# Patient Record
Sex: Male | Born: 1950
Health system: Southern US, Community
[De-identification: ages and names within clinical notes are randomized; demographics above are authoritative.]

## PROBLEM LIST (undated history)

## (undated) DIAGNOSIS — I219 Acute myocardial infarction, unspecified: Secondary | ICD-10-CM

## (undated) DIAGNOSIS — M545 Low back pain, unspecified: Secondary | ICD-10-CM

## (undated) DIAGNOSIS — E119 Type 2 diabetes mellitus without complications: Secondary | ICD-10-CM

## (undated) DIAGNOSIS — H269 Unspecified cataract: Secondary | ICD-10-CM

## (undated) DIAGNOSIS — I10 Essential (primary) hypertension: Secondary | ICD-10-CM

## (undated) DIAGNOSIS — N289 Disorder of kidney and ureter, unspecified: Secondary | ICD-10-CM

## (undated) DIAGNOSIS — H409 Unspecified glaucoma: Secondary | ICD-10-CM

## (undated) DIAGNOSIS — E782 Mixed hyperlipidemia: Secondary | ICD-10-CM

## (undated) DIAGNOSIS — I2510 Atherosclerotic heart disease of native coronary artery without angina pectoris: Secondary | ICD-10-CM

## (undated) DIAGNOSIS — N183 Chronic kidney disease, stage 3 unspecified: Secondary | ICD-10-CM

## (undated) HISTORY — DX: Unspecified cataract: H26.9

## (undated) HISTORY — DX: Low back pain, unspecified: M54.50

## (undated) HISTORY — DX: Type 2 diabetes mellitus without complications: E11.9

## (undated) HISTORY — DX: Atherosclerotic heart disease of native coronary artery without angina pectoris: I25.10

## (undated) HISTORY — DX: Acute myocardial infarction, unspecified: I21.9

## (undated) HISTORY — DX: Essential (primary) hypertension: I10

## (undated) HISTORY — PX: CATARACT EXTRACTION, BILATERAL: SHX1313

## (undated) HISTORY — DX: Unspecified glaucoma: H40.9

## (undated) HISTORY — DX: Chronic kidney disease, stage 3 (moderate): N18.3

## (undated) HISTORY — DX: Mixed hyperlipidemia: E78.2

## (undated) HISTORY — DX: Low back pain: M54.5

## (undated) HISTORY — DX: Chronic kidney disease, stage 3 unspecified: N18.30

---

## 2004-05-07 HISTORY — PX: CORONARY ANGIOPLASTY WITH STENT PLACEMENT: SHX49

## 2004-12-04 ENCOUNTER — Inpatient Hospital Stay (HOSPITAL_COMMUNITY): Admission: AD | Admit: 2004-12-04 | Discharge: 2004-12-07 | Payer: Self-pay | Admitting: Cardiology

## 2004-12-04 ENCOUNTER — Encounter: Payer: Self-pay | Admitting: Cardiology

## 2004-12-04 ENCOUNTER — Ambulatory Visit: Payer: Self-pay | Admitting: Internal Medicine

## 2004-12-04 ENCOUNTER — Ambulatory Visit: Payer: Self-pay | Admitting: Cardiology

## 2004-12-05 ENCOUNTER — Encounter: Payer: Self-pay | Admitting: Cardiology

## 2004-12-14 ENCOUNTER — Ambulatory Visit: Payer: Self-pay | Admitting: Family Medicine

## 2004-12-21 ENCOUNTER — Ambulatory Visit: Payer: Self-pay | Admitting: Internal Medicine

## 2005-01-23 ENCOUNTER — Ambulatory Visit: Payer: Self-pay | Admitting: Family Medicine

## 2005-04-04 ENCOUNTER — Ambulatory Visit: Payer: Self-pay | Admitting: Cardiology

## 2005-04-20 ENCOUNTER — Ambulatory Visit: Payer: Self-pay | Admitting: Family Medicine

## 2005-05-15 ENCOUNTER — Ambulatory Visit: Payer: Self-pay | Admitting: Family Medicine

## 2005-11-30 ENCOUNTER — Ambulatory Visit: Payer: Self-pay | Admitting: Cardiology

## 2006-12-04 ENCOUNTER — Ambulatory Visit: Payer: Self-pay | Admitting: Cardiology

## 2007-03-26 ENCOUNTER — Ambulatory Visit: Payer: Self-pay | Admitting: Cardiology

## 2007-07-23 ENCOUNTER — Ambulatory Visit: Payer: Self-pay | Admitting: Cardiology

## 2007-11-12 ENCOUNTER — Ambulatory Visit: Payer: Self-pay | Admitting: Cardiology

## 2007-11-12 LAB — CONVERTED CEMR LAB
ALT: 22 units/L
Albumin: 4.7 g/dL
BUN: 19 mg/dL
Calcium: 10.1 mg/dL
Chloride: 103 meq/L
HCT: 45.6 %
Hemoglobin: 15.6 g/dL
Total Bilirubin: 0.8 mg/dL
Total Protein: 7.2 g/dL
WBC: 6.5 10*3/uL

## 2008-03-19 ENCOUNTER — Ambulatory Visit: Payer: Self-pay | Admitting: Cardiology

## 2008-08-02 ENCOUNTER — Ambulatory Visit: Payer: Self-pay | Admitting: Cardiology

## 2008-11-24 DIAGNOSIS — E782 Mixed hyperlipidemia: Secondary | ICD-10-CM | POA: Insufficient documentation

## 2008-12-01 ENCOUNTER — Ambulatory Visit: Payer: Self-pay | Admitting: Cardiology

## 2008-12-01 ENCOUNTER — Encounter (INDEPENDENT_AMBULATORY_CARE_PROVIDER_SITE_OTHER): Payer: Self-pay | Admitting: *Deleted

## 2008-12-09 ENCOUNTER — Encounter: Payer: Self-pay | Admitting: Cardiology

## 2008-12-09 LAB — CONVERTED CEMR LAB
CO2: 20 meq/L (ref 19–32)
Creatinine, Ser: 1.33 mg/dL (ref 0.40–1.50)
Potassium: 4.7 meq/L (ref 3.5–5.3)

## 2009-01-18 ENCOUNTER — Ambulatory Visit: Payer: Self-pay | Admitting: Cardiology

## 2009-02-02 ENCOUNTER — Ambulatory Visit: Payer: Self-pay | Admitting: Cardiology

## 2009-02-02 ENCOUNTER — Encounter: Payer: Self-pay | Admitting: Cardiology

## 2009-02-03 ENCOUNTER — Encounter (INDEPENDENT_AMBULATORY_CARE_PROVIDER_SITE_OTHER): Payer: Self-pay | Admitting: *Deleted

## 2009-02-07 ENCOUNTER — Encounter (INDEPENDENT_AMBULATORY_CARE_PROVIDER_SITE_OTHER): Payer: Self-pay | Admitting: *Deleted

## 2009-02-22 ENCOUNTER — Telehealth (INDEPENDENT_AMBULATORY_CARE_PROVIDER_SITE_OTHER): Payer: Self-pay | Admitting: *Deleted

## 2009-03-07 ENCOUNTER — Encounter (INDEPENDENT_AMBULATORY_CARE_PROVIDER_SITE_OTHER): Payer: Self-pay | Admitting: *Deleted

## 2009-03-14 ENCOUNTER — Encounter: Payer: Self-pay | Admitting: Cardiology

## 2009-03-23 ENCOUNTER — Ambulatory Visit: Payer: Self-pay | Admitting: Cardiology

## 2009-04-08 ENCOUNTER — Encounter (INDEPENDENT_AMBULATORY_CARE_PROVIDER_SITE_OTHER): Payer: Self-pay | Admitting: *Deleted

## 2009-04-26 ENCOUNTER — Encounter (INDEPENDENT_AMBULATORY_CARE_PROVIDER_SITE_OTHER): Payer: Self-pay | Admitting: *Deleted

## 2009-07-28 ENCOUNTER — Ambulatory Visit: Payer: Self-pay | Admitting: Cardiology

## 2009-08-22 ENCOUNTER — Ambulatory Visit: Payer: Self-pay | Admitting: Cardiology

## 2009-08-22 DIAGNOSIS — G4700 Insomnia, unspecified: Secondary | ICD-10-CM | POA: Insufficient documentation

## 2009-11-25 ENCOUNTER — Ambulatory Visit: Payer: Self-pay | Admitting: Cardiology

## 2009-11-25 DIAGNOSIS — E119 Type 2 diabetes mellitus without complications: Secondary | ICD-10-CM

## 2009-12-07 ENCOUNTER — Telehealth (INDEPENDENT_AMBULATORY_CARE_PROVIDER_SITE_OTHER): Payer: Self-pay | Admitting: *Deleted

## 2010-01-04 ENCOUNTER — Telehealth (INDEPENDENT_AMBULATORY_CARE_PROVIDER_SITE_OTHER): Payer: Self-pay | Admitting: *Deleted

## 2010-01-04 DIAGNOSIS — I2510 Atherosclerotic heart disease of native coronary artery without angina pectoris: Secondary | ICD-10-CM | POA: Insufficient documentation

## 2010-01-16 ENCOUNTER — Encounter (INDEPENDENT_AMBULATORY_CARE_PROVIDER_SITE_OTHER): Payer: Self-pay | Admitting: *Deleted

## 2010-03-01 ENCOUNTER — Ambulatory Visit: Payer: Self-pay | Admitting: Cardiology

## 2010-04-11 ENCOUNTER — Ambulatory Visit: Payer: Self-pay | Admitting: Cardiology

## 2010-06-06 NOTE — Letter (Signed)
Summary: Generic Letter  Architectural technologist at [place]  [address], [place] [address]   Phone: [phone]  Fax: [phone]        January 16, 2010 MRN: [id number]    [facility] [address] [place], [place]  [address]    Dear Mr. Penny,   After numerous attempts to reach you by telephone.  We are contacting   you by letter to let you know that you no longer need to take plavix   75mg  daily.  This medication can be stopped per Dr. Dietrich Pates and Dr.   Riley Kill.  Please continue to take an 81mg  aspirin daily.  Please call   our office and give a working number that will accept our messages.         Sincerely, Teressa Lower RN  This letter has been electronically signed by your physician.

## 2010-06-06 NOTE — Assessment & Plan Note (Signed)
Summary: 6 MOFU PER MARCH REMIDNER-SRS   Visit Type:  Follow-up Primary Provider:  [facility]  CC:  follow-up visit.  History of Present Illness: the patient is a 60 year old male with history of coronary artery disease, status post prior anterior wall myocardial infarction July 2006. The patient continues to participate in a cholesterol study drug protocol. The patient had a recent stress test done which showed a small anterior scar/defect, no ischemia with preserved LV function of 64%.  The patient denies any chest pain shortness of breath orthopnea PND. He stable from a cardiovascular perspective. He denies any palpitations or presyncope.  The patient lives by himself, he only has a small social network to spend most of the day by himself. He does not meet frank criteria for anxiety disorder or depression but does report significant insomnia and fatigue. The patient is requesting a sleep medication. He states symptoms have been going on for a long time.  Clinical Review Panels:  Cardiac Imaging Cardiac Cath Findings  CONCLUSION:  1.  Acute anterior wall infarction with subtotal occlusion of the left      anterior descending with slow flow and successful stenting and total      occlusion of the diagonal subbranch, which was successfully dilated with      restoration of TIMI-3 flow.  2.  Diffuse luminal irregularities throughout the coronary vasculature      suggestive of diabetic coronary arteriopathy   PLAN:  1.  We will need to arrange for a primary care physician for long-term      diabetic management.  2.  Hemoglobin A1c will be obtained.  3.  The patient will be treated with aspirin and Plavix indefinitely.  4.  Consideration will be given to enrollment of the patient is if he      consents into the IMPROVE-IT study.  5.  I will review the films with my colleagues to make a decision about the      right coronary artery.  6.  The sheath will be removed in  the coronary care unit with the ACT is      less than 150 seconds.      TDS/MEDQ  D:  12/04/2004  T:  12/04/2004  Job:  [id number]   cc:   [facility]   Marrian Salvage. Freida Busman, M.D. [facility] (12/04/2004)    Preventive Screening-Counseling & Management  Alcohol-Tobacco     Smoking Status: quit     Year Quit: 1989  Current Medications (verified): 1)  Metformin Hcl 500 Mg Tabs (Metformin Hcl) .... Take 1 Tab Two Times A Day 2)  Glipizide 5 Mg Tabs (Glipizide) .... Take 1 Tab Two Times A Day 3)  Plavix 75 Mg Tabs (Clopidogrel Bisulfate) .... Take 1 Tablet Daily 4)  Metoprolol Tartrate 25 Mg Tabs (Metoprolol Tartrate) .... Take 1 Tablet Two Times A Day 5)  Nitrostat 0.4 Mg Subl (Nitroglycerin) .... Place Under Tongue As Directed For Chest Pain Not To Exceed 3 Tabs 6)  Ascensia Autodisc Test  Disk (Glucose Blood) .... Test Blood On Strip At Bedtime and Before Meals 7)  Lisinopril 20 Mg Tabs (Lisinopril) .... Take 1 Tablet By Mouth Once A Day 8)  Aspirin 81 Mg Tbec (Aspirin) .... Take One Tablet By Mouth Daily 9)  Research Study Drug .... Take As Directed 10)  Ambien 5 Mg Tabs (Zolpidem Tartrate) .... Take 1 Tab By Mouth At Bedtime  Allergies (verified): No Known Drug Allergies  Comments:  Nurse/Medical Assistant: The patient's medications and allergies were reviewed with the patient and were updated in the Medication and Allergy Lists. Bottles reviewed.  Past History:  Past Medical History: Last updated: 01/18/2009 Current Problems:  HYPERTENSION (ICD-401.9) DIABETES MELLITUS, TYPE II, UNCONTROLLED, WITH KETOACIDOSIS (ICD-250.10) HYPERLIPIDEMIA (ICD-272.4) CAD (ICD-414.00)  1. Coronary artery disease, status post anterior myocardial infarction     in July 2006, treated with drug-eluting stent to the left anterior     descending.     a.     Residual 75% distal right coronary artery lesion. 2. Preserved left ventricular function with a ejection fraction of     50%. 3.  Diabetes mellitus. 4. Hypertension. 5. Dyslipidemia.     a.     IMPROVE-IT study.   Past Surgical History: Last updated: 12-12-08 cath (stenting of the mid left anterior decending coronary artery with bare metal stent)  Family History: Last updated: 12-Dec-2008 Father:deceased brain aneurysm Mother:small cell carcinoma of lung,kidney and heart  Social History: Last updated: December 12, 2008 Disabled  Married  Tobacco Use - Former.  Alcohol Use - no Regular Exercise - no Drug Use - no pt has 2 sons  Risk Factors: Exercise: no (12-Dec-2008)  Risk Factors: Smoking Status: quit (08/22/2009)  Social History: Smoking Status:  quit  Review of Systems  The patient denies fatigue, malaise, fever, weight gain/loss, vision loss, decreased hearing, hoarseness, chest pain, palpitations, shortness of breath, prolonged cough, wheezing, sleep apnea, coughing up blood, abdominal pain, blood in stool, nausea, vomiting, diarrhea, heartburn, incontinence, blood in urine, muscle weakness, joint pain, leg swelling, rash, skin lesions, headache, fainting, dizziness, depression, anxiety, enlarged lymph nodes, easy bruising or bleeding, and environmental allergies.    Vital Signs:  Patient profile:   60 year old male Height:      70 inches Weight:      176 pounds Pulse rate:   67 / minute BP sitting:   131 / 85  (left arm) Cuff size:   regular  Vitals Entered By: Carlye Grippe (August 22, 2009 8:44 AM) CC: follow-up visit   Physical Exam  Additional Exam:  General: Well-developed, well-nourished in no distress head: Normocephalic and atraumatic eyes PERRLA/EOMI intact, conjunctiva and lids normal nose: No deformity or lesions mouth normal dentition, normal posterior pharynx neck: Supple, no JVD.  No masses, thyromegaly or abnormal cervical nodes lungs: Normal breath sounds bilaterally without wheezing.  Normal percussion heart: regular rate and rhythm with normal S1 and S2, no S3 or S4.   PMI is normal.  No pathological murmurs abdomen: Normal bowel sounds, abdomen is soft and nontender without masses, organomegaly or hernias noted.  No hepatosplenomegaly musculoskeletal: Back normal, normal gait muscle strength and tone normal pulsus: Pulse is normal in all 4 extremities Extremities: No peripheral pitting edema neurologic: Alert and oriented x 3 skin: Intact without lesions or rashes cervical nodes: No significant adenopathy psychologic: Normal affect    Nuclear ETT  Procedure date:  08/24/2009  Findings:      no chest pain no EKG change moderate decrease in activity in the mid anterior wall and the apex. Thick scar. Consistent with old anteroapical infarct no ischemia ejection fraction 64%  Impression & Recommendations:  Problem # 1:  CORONARY ARTERY DISEASE, S/P PTCA (ICD-414.9) the patient denies intercurrent chest pain. He had a recent Cardiolite study done which was negative for ischemia. The following medications were removed from the medication list:    Prinivil 20 Mg Tabs (Lisinopril) .[name]... Take 1 tablet daily His updated  medication list for this problem includes:    Plavix 75 Mg Tabs (Clopidogrel bisulfate) .[name]... Take 1 tablet daily    Metoprolol Tartrate 25 Mg Tabs (Metoprolol tartrate) .[name]... Take 1 tablet two times a day    Nitrostat 0.4 Mg Subl (Nitroglycerin) .[name]... Place under tongue as directed for chest pain not to exceed 3 tabs    Lisinopril 20 Mg Tabs (Lisinopril) .[name]... Take 1 tablet by mouth once a day    Aspirin 81 Mg Tbec (Aspirin) .[name]... Take one tablet by mouth daily  Problem # 2:  HYPERTENSION (ICD-401.9) blood pressure is well controlled her current medical regimen. The following medications were removed from the medication list:    Prinivil 20 Mg Tabs (Lisinopril) .[name]... Take 1 tablet daily His updated medication list for this problem includes:    Metoprolol Tartrate 25 Mg Tabs (Metoprolol tartrate) .[name]... Take 1 tablet two times a day     Lisinopril 20 Mg Tabs (Lisinopril) .[name]... Take 1 tablet by mouth once a day    Aspirin 81 Mg Tbec (Aspirin) .[name]... Take one tablet by mouth daily  Problem # 3:  HYPERLIPIDEMIA (ICD-272.4) patient is followed in a research protocol for his lipid panel.  Problem # 4:  INSOMNIA, CHRONIC (ICD-307.42) the patient does not meet criteria for depression or generalized anxiety disorder. However the patient does live by himself and is at risk for mood disorder. Active and in the short term prescription of Ambien 5 minutes p.o. q.h.s. without any refills.  Patient Instructions: 1)  Ambien 5mg  at bedtime  2)  Follow up in  6 months  Prescriptions: AMBIEN 5 MG TABS (ZOLPIDEM TARTRATE) Take 1 tab by mouth at bedtime  #30 x 0   Entered by:   Hoover Brunette, LPN   Authorized by:   Lewayne Bunting, MD, [facility]   Signed by:   Hoover Brunette, LPN on 89/38/1017   Method used:   Print then Give to Patient   RxID:   [id number]   Handout requested.

## 2010-06-06 NOTE — Progress Notes (Signed)
Summary: Plavix no longer necessary per Dr. Riley Kill  Phone Note Other Incoming   Caller: Dr. Shawnie Pons Call For: Dr. Dietrich Pates Details for Reason: Opinion re: D/C Plavix Summary of Call: Mr. [facility] cardiac cath note in 2006 suggested that ASA and clopidigrel would be required indefinitely.  I asked Dr. Riley Kill to review, who provided the following:  Rob I reviewed all of the notes regarding Edward [name].  The cath report says we placed a non drug eluting stent, your office note says DES.  The stent was a non drug eluting.  As such, there is no indication for continuing this, although he has other potential other reasons, such as high grade RCA disease at baseline.  He likely shoud have ischemia assessment as a diabetic.  But specifically, no definite indication.  Accordingly, clopidigrel will be discontinued.  Continue ASA at 81 mg per day.  Office F/U as planned.  Initial call taken by: Kathlen Brunswick, MD, [facility],  January 04, 2010 4:12 PM Caller: Dr. Riley Kill Request: Send information Details for Reason: Longterm Rx with clopidigrel Summary of Call: Edward [name] has been treated with clopidigrel since BMS placed in 2006.  Cath note indicated that such therapy would be required indefinitely.  I asked Dr. Riley Kill to review that recommendation.  He replied:  Rob I reviewed all of the notes regarding Edward [name].  The cath report says we placed a non drug eluting stent, your office note says DES.  The stent was a non drug eluting.  As such, there is no indication for continuing this, although he has other potential other reasons, such as high grade RCA disease at baseline.  He likely shoud have ischemia assessment as a diabetic.  But specifically, no definite indication.  Accordingly, clopidirel will be discontinued and aspirin utilized at a dose of 81 mg/day.  Initial call taken by: Kathlen Brunswick, MD, [facility],  January 04, 2010 4:18 PM  New Problems: ATHEROSCLEROTIC CARDIOVASCULAR  DISEASE (ICD-429.2)   New Problems: ATHEROSCLEROTIC CARDIOVASCULAR DISEASE (ICD-429.2)    Past History:  Past Medical History: ASCVD: AMI in July 2006, BMS LAD; residual 75% distal RCA; EF 50%. AODM-no insulin Hypertension Hyperlipidemia- IMPROVE-IT study.  Tobacco abuse-remote Low back pain  Unable to reach pt by telephone after numerous attempts and trying to get other numbers, none available... lettet mailed 01/17/10    Teressa Lower RN  January 16, 2010 2:29 PM

## 2010-06-06 NOTE — Progress Notes (Signed)
Summary: Refill  Phone Note Call from Patient   Caller: Patient Reason for Call: Refill Medication Summary of Call: pt's wife states Glipizide was to have 180 pills and not 90 for 3 mth supply/ would like it sent to [facility] in [place]/tg Initial call taken by: Raechel Ache [facility],  December 07, 2009 3:22 PM    Prescriptions: GLIPIZIDE 5 MG TABS (GLIPIZIDE) TAKE 1 TAB two times a day  #180 x 3   Entered by:   Teressa Lower RN   Authorized by:   Kathlen Brunswick, MD, [facility]   Signed by:   Teressa Lower RN on 12/07/2009   Method used:   Electronically to        [facility] (retail)       [address]       [place], [place]  [address]       Ph: [phone]       Fax: [phone]   RxID:   [id number]

## 2010-06-06 NOTE — Assessment & Plan Note (Signed)
Summary: research 5 yr improve study   -  Date:  11/12/2007    SGOT (AST): 23    SGPT (ALT): 22    T. Bilirubin: 0.8    Alk Phos: 37    Total Protein: 7.2    Albumin: 4.7    WBC: 6.5    HGB: 15.6    HCT: 45.6    PLT: 161   Allergies: No Known Drug Allergies  Past History:  Past Medical History: ASCVD: AMI in July 2006, DES LAD; residual 75% distal RCA; EF 50%. AODM-no insulin Hypertension Hyperlipidemia- IMPROVE-IT study.  Tobacco abuse-remote Low back pain  Social History: Disabled  Married  Tobacco Use - Remote Alcohol Use - no Regular Exercise - no Drug Use - no pt has 2 sons  Appended Document: Edward [name]     Visit Type:  Follow-up Primary Provider:  [facility]  CC:  .[name]  History of Present Illness:  [name] returns to the office for reassessment in conjunction with his participation in a protocol managed by the [facility] for control of hyperlipidemia.  Since his last visit, he has done exceedingly well.  He denies chest discomfort or exertional dyspnea.  He walks up to 2 miles per day without difficulty.  He has no orthopnea, PND nor pedal edema.  He has had no new medical problems, has not been seen in the emergency department nor has he been admitted to hospital.  He does not currently have a primary care provider, but previously was seen at [facility].  Diabetic control has been good as assessed at home.  Preventive Screening-Counseling & Management  Alcohol-Tobacco     Smoking Status: quit  Comments: quit 20-30 years ago  Current Medications (verified): 1)  Metformin Hcl 500 Mg Tabs (Metformin Hcl) .... Take 1 Tab Two Times A Day 2)  Glipizide 5 Mg Tabs (Glipizide) .... Take 1 Tab Two Times A Day 3)  Plavix 75 Mg Tabs (Clopidogrel Bisulfate) .... Take 1 Tablet Daily- Stop After Current Rx Complete 4)  Metoprolol Tartrate 25 Mg Tabs (Metoprolol Tartrate)  .... Take 1 Tablet Two Times A Day 5)  Nitrostat 0.4 Mg Subl (Nitroglycerin) .... Place Under Tongue As Directed For Chest Pain Not To Exceed 3 Tabs 6)  Ascensia Autodisc Test  Disk (Glucose Blood) .... Test Blood On Strip At Bedtime and Before Meals 7)  Lisinopril 20 Mg Tabs (Lisinopril) .... Take 1 Tablet By Mouth Once A Day 8)  Aspirin 81 Mg Tbec (Aspirin) .... Take One Tablet By Mouth Daily 9)  Research Study Drug .... Take As Directed 10)  Ambien 10 Mg Tabs (Zolpidem Tartrate) .... Take 1 Tab By Mouth At Bedtime 11)  Fish Oil 1000 Mg Caps (Omega-3 Fatty Acids) .... Take 1 Tablet By Mouth Two Times A Day  Allergies (verified): No Known Drug Allergies  Past History:  Past Medical History: Last updated: 11/25/2009 ASCVD: AMI in July 2006, DES LAD; residual 75% distal RCA; EF 50%. AODM-no insulin Hypertension Hyperlipidemia- IMPROVE-IT study.  Tobacco abuse-remote Low back pain  Past Surgical History: Last updated: Dec 13, 2008 cath (stenting of the mid left anterior decending coronary artery with bare metal stent)  Family History: Last updated: 12/13/08 Father:deceased brain aneurysm Mother:small cell carcinoma of lung,kidney and heart  Social History: Last updated: 11/25/2009 Disabled  Married  Tobacco Use - Remote Alcohol Use - no Regular Exercise - no Drug Use - no pt has 2 sons  Review of Systems       See history of present illness  Vital Signs:  Patient profile:   60 year old male Height:      70 inches Weight:      175 pounds BMI:     25.20 O2 Sat:      96 % Pulse rate:   58 / minute BP sitting:   127 / 74  (left arm)  Vitals Entered By: Youlanda Mighty RN (November 25, 2009 10:44 AM)  Nutrition Counseling: Patient's BMI is greater than 25 and therefore counseled on weight management options. CC: .   Physical Exam  General:   General-Well developed; no acute distress:   Neck-No JVD; no carotid bruits: Lungs-No tachypnea, no rales; no  rhonchi; no wheezes: Cardiovascular-normal PMI; normal S1 and S2: Abdomen-BS normal; soft and non-tender without masses or organomegaly:  Musculoskeletal-No deformities, no cyanosis or clubbing: Neurologic-Normal cranial nerves; symmetric strength and tone:  Skin-Warm, no significant lesions: Extremities-Nl distal pulses Except for a decreased left dorsalis pedis; no edema:     Impression & Recommendations:  Problem # 1:  ATHEROSCLEROTIC CARDIOVASCULAR DISEASE (ICD-429.2) Patient is doing superbly with no symptoms; aour approach will continue to be ideal management of cardiovascular risk factors.  Laboratory testing is managed via his research protocol as is adjustment of lipid lowering therapy.    Patient's original cardiac catheterization note called for lifelong treatment with clopidogrel.  I have discussed his case with Dr. Riley Kill, who will review his chart and catheterization films to determine if he is still concurs with his initial recommendation.  Problem # 2:  HYPERTENSION (ICD-401.9) Blood pressure control is excellent; current medications will be continued.  Problem # 3:  TOBACCO ABUSE-REMOTE (ICD-305.1) Discontinued more than 20 years ago.  Previous tobacco use is no longer contributing cardiac or pulmonary risk.  Problem # 4:  DIABETES MELLITUS, TYPE II-NO INSULIN (ICD-250.00) Patient will be referred back to [facility] for management of diabetes and other general medical issues.  One of the [facility] cardiologist and will see him again in a few months per protocol.  Patient Instructions: 1)  Your physician recommends that you schedule a follow-up appointment in: PER RESEARCH NURESES 2)  Your physician has recommended you make the following change in your medication:  STOP PLAVIX AFTER CURRENT PRESCRIOPTION COMPLETE- DR. Ioma Chismar WILL DISCUSS WITH DR. Riley Kill , FISH OIL 1 TABLET TWICE A DAY IF OK WITH RESEARCH NURSE 3)  You have been referred to DR. MOORE'S OFFICE  IN [place] Prescriptions: AMBIEN 10 MG TABS (ZOLPIDEM TARTRATE) Take 1 tab by mouth at bedtime  #90 x 1   Entered by:   Teressa Lower RN   Authorized by:   Kathlen Brunswick, MD, [facility]   Signed by:   Teressa Lower RN on 11/25/2009   Method used:   Print then Give to Patient   RxID:   [id number] GLIPIZIDE 5 MG TABS (GLIPIZIDE) TAKE 1 TAB two times a day  #90 x 3   Entered by:   Teressa Lower RN   Authorized by:   Kathlen Brunswick, MD, [facility]   Signed by:   Teressa Lower RN on 11/25/2009   Method used:   Print then Give to Patient   RxID:   [id number] METFORMIN HCL 500 MG TABS (METFORMIN HCL) TAKE 1 TAB two times a day  #180 x 3   Entered by:   Teressa Lower RN   Authorized by:   Kathlen Brunswick, MD, [facility]  Signed by:   Teressa Lower RN on 11/25/2009   Method used:   Print then Give to Patient   RxID:   [id number] METOPROLOL TARTRATE 25 MG TABS (METOPROLOL TARTRATE) TAKE 1 TABLET two times a day  #180 x 3   Entered by:   Teressa Lower RN   Authorized by:   Kathlen Brunswick, MD, [facility]   Signed by:   Teressa Lower RN on 11/25/2009   Method used:   Print then Give to Patient   RxID:   [id number] NITROSTAT 0.4 MG SUBL (NITROGLYCERIN) PLACE UNDER TONGUE AS DIRECTED FOR CHEST PAIN NOT TO EXCEED 3 TABS  #15 x 3   Entered by:   Teressa Lower RN   Authorized by:   Kathlen Brunswick, MD, [facility]   Signed by:   Teressa Lower RN on 11/25/2009   Method used:   Print then Give to Patient   RxID:   [id number] LISINOPRIL 20 MG TABS (LISINOPRIL) Take 1 tablet by mouth once a day  #90 x 3   Entered by:   Teressa Lower RN   Authorized by:   Kathlen Brunswick, MD, [facility]   Signed by:   Teressa Lower RN on 11/25/2009   Method used:   Print then Give to Patient   RxID:   [id number]

## 2010-06-06 NOTE — Assessment & Plan Note (Signed)
Summary: 6 mo fu per sept reminder   Visit Type:  Follow-up Primary Provider:  none   History of Present Illness: Edward [name] is a 60 year old male with a history of coronary artery disease, status post anterior wall myocardial infarction in July of 2006, treated with a drug-eluting stent at that time. He has known residual 75% disease of the distal right coronary artery with preserved ejection fraction. He had a Cardiolite stress study done a year ago which was within normal limits. He has multiple risk factors including diabetes mellitus hypertension and dyslipidemia he is enrolled into improving study.  He is doing very well from a cardiovascular perspective. He reports no chest pain shortness of breath orthopnea or PND. He did not require any nitroglycerin as a matter of fact his prescription of nitroglycerin has expired.  His EKG was reviewed in the office today and and was within normal limits. He reports no other complaints. He has blood work done regularly as part of the prove it study.  Preventive Screening-Counseling & Management  Alcohol-Tobacco     Smoking Status: quit     Year Quit: 1989  Current Medications (verified): 1)  Metformin Hcl 500 Mg Tabs (Metformin Hcl) .... Take 1 Tab Two Times A Day 2)  Glipizide 5 Mg Tabs (Glipizide) .... Take 1 Tab Two Times A Day 3)  Metoprolol Tartrate 25 Mg Tabs (Metoprolol Tartrate) .... Take 1 Tablet Two Times A Day 4)  Nitrostat 0.4 Mg Subl (Nitroglycerin) .... Place Under Tongue As Directed For Chest Pain Not To Exceed 3 Tabs 5)  Ascensia Autodisc Test  Disk (Glucose Blood) .... Test Blood On Strip At Bedtime and Before Meals 6)  Lisinopril 20 Mg Tabs (Lisinopril) .... Take 1 Tablet By Mouth Once A Day 7)  Aspirin 81 Mg Tbec (Aspirin) .... Take One Tablet By Mouth Daily 8)  Research Study Drug .... Take As Directed 9)  Ambien 10 Mg Tabs (Zolpidem Tartrate) .... Take 1 Tab By Mouth At Bedtime 10)  Fish Oil 1000 Mg Caps (Omega-3 Fatty  Acids) .... Take 1 Tablet By Mouth Two Times A Day  Allergies (verified): No Known Drug Allergies  Comments:  Nurse/Medical Assistant: The patient's medication bottles and allergies were reviewed with the patient and were updated in the Medication and Allergy Lists.  Past History:  Past Surgical History: Last updated: Dec 23, 2008 cath (stenting of the mid left anterior decending coronary artery with bare metal stent)  Family History: Last updated: 12-23-08 Father:deceased brain aneurysm Mother:small cell carcinoma of lung,kidney and heart  Social History: Last updated: 11/25/2009 Disabled  Married  Tobacco Use - Remote Alcohol Use - no Regular Exercise - no Drug Use - no pt has 2 sons  Risk Factors: Exercise: no (23-Dec-2008)  Risk Factors: Smoking Status: quit (03/01/2010)  Past Medical History: ASCVD: AMI in July 2006, BMS LAD; residual 75% distal RCA; EF 50%. AODM-no insulin Hypertension Hyperlipidemia- IMPROVE-IT study.  Tobacco abuse-remote Low back pain Cardiolite 2010 no ischemia  Review of Systems  The patient denies fatigue, malaise, fever, weight gain/loss, vision loss, decreased hearing, hoarseness, chest pain, palpitations, shortness of breath, prolonged cough, wheezing, sleep apnea, coughing up blood, abdominal pain, blood in stool, nausea, vomiting, diarrhea, heartburn, incontinence, blood in urine, muscle weakness, joint pain, leg swelling, rash, skin lesions, headache, fainting, dizziness, depression, anxiety, enlarged lymph nodes, easy bruising or bleeding, and environmental allergies.    Vital Signs:  Patient profile:   60 year old male Height:      30  inches Weight:      180 pounds Pulse rate:   64 / minute BP sitting:   125 / 81  (left arm) Cuff size:   regular  Vitals Entered By: Carlye Grippe (March 01, 2010 10:20 AM)  Physical Exam  Additional Exam:  General: Well-developed, well-nourished in no distress head: Normocephalic and  atraumatic eyes PERRLA/EOMI intact, conjunctiva and lids normal nose: No deformity or lesions mouth normal dentition, normal posterior pharynx neck: Supple, no JVD.  No masses, thyromegaly or abnormal cervical nodes lungs: Normal breath sounds bilaterally without wheezing.  Normal percussion heart: regular rate and rhythm with normal S1 and S2, no S3 or S4.  PMI is normal.  No pathological murmurs abdomen: Normal bowel sounds, abdomen is soft and nontender without masses, organomegaly or hernias noted.  No hepatosplenomegaly musculoskeletal: Back normal, normal gait muscle strength and tone normal pulsus: Pulse is normal in all 4 extremities Extremities: No peripheral pitting edema neurologic: Alert and oriented x 3 skin: Intact without lesions or rashes cervical nodes: No significant adenopathy psychologic: Normal affect    EKG  Procedure date:  03/01/2010  Findings:      normal sinus rhythm heart rate 58 beats per minute otherwise normal tracing.  Impression & Recommendations:  Problem # 1:  ATHEROSCLEROTIC CARDIOVASCULAR DISEASE (ICD-429.2) status post anteriorwall myocardial infarction but no evidence by current EKG. Normal ejection fraction and no clinical symptoms of heart failure. The patient will continue his current medical regimen. Dr. Dietrich Pates did stop his Plavix during his last visit.  Problem # 2:  HYPERLIPIDEMIA (ICD-272.4) followed in improved study  Problem # 3:  HYPERTENSION (ICD-401.9) blood pressure is well controlled and we do not need to make any further adjustments in his medications His updated medication list for this problem includes:    Metoprolol Tartrate 25 Mg Tabs (Metoprolol tartrate) .[name]... Take 1 tablet two times a day    Lisinopril 20 Mg Tabs (Lisinopril) .[name]... Take 1 tablet by mouth once a day    Aspirin 81 Mg Tbec (Aspirin) .[name]... Take one tablet by mouth daily  Orders: EKG w/ Interpretation (93000)  Problem # 4:  INSOMNIA, CHRONIC  (ICD-307.42) patient takes Ambien for sleep and is tolerating this well.  Patient Instructions: 1)  Your physician recommends that you continue on your current medications as directed. Please refer to the Current Medication list given to you today. 2)  Follow up in  6 months Prescriptions: NITROSTAT 0.4 MG SUBL (NITROGLYCERIN) PLACE UNDER TONGUE AS DIRECTED FOR CHEST PAIN NOT TO EXCEED 3 TABS  #25 x 3   Entered by:   Hoover Brunette, LPN   Authorized by:   Lewayne Bunting, MD, [facility]   Signed by:   Hoover Brunette, LPN on 07/37/1062   Method used:   Electronically to        [facility]  [address] (retail)       [address]       [place], [place]  [address]       Ph: [phone]       Fax: [phone]   RxID:   [id number]

## 2010-09-19 NOTE — Assessment & Plan Note (Signed)
[facility]                       [place] CARDIOLOGY OFFICE NOTE   NAME:Estey, ALDINE                         MRN:          [id number]  DATE:11/12/2007                            DOB:          04/17/1951    CARDIOLOGIST:  Unknown.   PRIMARY CARE PHYSICIAN:  None.   REASON FOR VISIT:  Research - IMPROVE-IT   HISTORY OF PRESENT ILLNESS:  Mr. Topper is a 60 year old male patient  with a history of coronary artery disease status post acute anterior  myocardial infarction in 2006, treated with a drug-eluting stent to the  LAD.  He had 75% distal RCA lesion that was treated medically.  He is  enrolled in IMPROVE-IT study and returns for followup today.  He also  has diabetes mellitus as well as dyslipidemia and hypertension.  The  patient does not follow up with any of our cardiologists secondary to  cost issues.  He also does not have a primary care physician.  He  complains of some back discomfort that he has had since his myocardial  infarction.  This occurs at rest and sometimes with exertion.  There has  essentially been no change since his myocardial infarction in 2006.  He  has occasional shortness of breath.  Again, there has been no change  over the years.  He denies orthopnea, PND, or pedal edema.  He denies  any syncope or palpitations.   CURRENT MEDICATIONS:  Study drug, aspirin 81 mg a day, metformin 500 mg  b.i.d., multivitamin, Plavix 75 mg daily, glipizide 5 mg b.i.d.,  lisinopril 20 mg daily, metoprolol 25 mg b.i.d., and nitroglycerin  p.r.n. chest pain.   PHYSICAL EXAMINATION:  GENERAL:  He is well nourished and well  developed, in no distress.  VITAL SIGNS:  Blood pressure is 118/73, pulse 53, and weight 181 pounds.  HEENT:  Normal.  NECK:  Without JVD.  CARDIAC:  S1 and S2.  Regular rate and rhythm without murmur.  LUNGS:  Clear to auscultation bilaterally.  ABDOMEN:  Soft and nontender.  EXTREMITIES:  No edema.  NEUROLOGIC:   He is alert and oriented x3.  Cranial nerves II through XII  are grossly intact.  VASCULAR:  Without carotid bruits bilaterally.   Electrocardiogram reveals sinus bradycardia with a heart rate of 53,  normal axis, no acute changes.   IMPRESSION:  1. Coronary artery disease, status post anterior myocardial infarction      in July 2006, treated with drug-eluting stent to the left anterior      descending.      a.     Residual 75% distal right coronary artery lesion.  2. Preserved left ventricular function with a ejection fraction of      50%.  3. Diabetes mellitus.  4. Hypertension.  5. Dyslipidemia.      a.     IMPROVE-IT study.   PLAN:  Mr. Westcott presents for followup.  He does not present for  routine followup in our cardiology clinic nor does he have a primary  care physician.  I explained to him the importance of routine followup.  We have encouraged him to set up a followup appointment in the next 6  months either in our [place] clinic or the [facility] clinic where he has  been seen in the past.  We will obtain the labs that were drawn with  research today to ensure that his renal function is stable with his  angiotensin converting enzyme inhibitor and metformin therapy.  We have  encouraged him to seek followup with primary care.  We will try to refer  him to the [facility] if he chooses to do that.      Tereso Newcomer, PA-C  Electronically Signed      Gerrit Friends. Dietrich Pates, MD, [facility]  Electronically Signed   SW/MedQ  DD: 11/12/2007  DT: 11/12/2007  Job #: [id number]

## 2010-09-19 NOTE — Assessment & Plan Note (Signed)
[facility]                       [place] CARDIOLOGY OFFICE NOTE   NAME:Edward [name], Edward [name]                         MRN:          [id number]  DATE:12/04/2006                            DOB:          10-24-1950    [name] is seen in conjunction with the Improve-It study.  He does  not schedule regular clinical visits due to cost considerations.   His current medications are as listed in the chart.  He is occasionally  followed diabetic control at home with CBGs generally less than 150.  He  reports some intermittent back pain that seems to be in conjunction with  the beating of his heart but has no worrisome chest discomfort nor  dyspnea.  He has been unemployed for years due to inability to find  work.  He has a modest remote history of cigarette smoking.   His exam is benign.  Blood pressure is good.   We renewed his medications, including clopidogrel, which was recommended  indefinitely at the time a bare metal stent  was placed for acute  myocardial infarction in 2006.  A hemoglobin A1C level will  be  obtained.     Edward [name]. Dietrich Pates, MD, [facility]  Electronically Signed    RMR/MedQ  DD: 12/04/2006  DT: 12/04/2006  Job #: [id number]

## 2010-09-22 NOTE — H&P (Signed)
NAME:  Edward [name], Edward [name]                ACCOUNT NO.:  [id number]   MEDICAL RECORD NO.:  [id number]          PATIENT TYPE:  INP   LOCATION:  NA                           FACILITY:  [facility]   PHYSICIAN:  Larry A. Freida Busman, M.D. [facility] OF BIRTH:  1951-04-13   DATE OF ADMISSION:  12/04/2004  DATE OF DISCHARGE:                                HISTORY & PHYSICAL   PRIMARY CARE PHYSICIAN:  None.   CARDIOLOGIST:  None.   CHIEF COMPLAINT:  Chest pain.   HISTORY OF PRESENT ILLNESS:  The patient is a 60 year old man with minimal  past medical history, no known prior heart disease; and cardiac risk factors  of prior smoking and positive family, but no known hypertension,  hyperlipidemia or diabetes.   He was in his usual state of health until 11:30 p.m. tonight (December 03, 2004)  while lying in bed.  He had a relatively rapid onset of substernal chest  pain and left-sided chest pain, radiating into the left arm and his back.  It was 8/10 in severity.  He had no associated shortness of breath or jaw  pain, and initially no nausea or vomiting.  He took five baby aspirin and  was brought to the [facility] Emergency Department.  At [facility], the  patient's initial vital signs were stable.  An EKG showed ST elevations in  lead L, with marked ST depression inferiorly.  [facility] was called for  transfer.  Per our request, he was given heparin and Integrilin.  His  potassium was also 3.0 and he was given Kay Ciel 40 mEq.   The patient's presenting time to [facility] was 2356 on December 03, 2004.  He arrived at [facility] at the catheterization lab at [facility]  on December 04, 2004.  Catheterization showed 99% occlusion of the mid-LA, an  LVEDP of 22, and a V-gram showing anterior distal akinesis.  Balloon time  was 0305 on December 04, 2004.  The patient had some nausea and vomiting  preprocedure.  By arrival to [facility] his chest pain had come  down to about a 2/10 with nitroglycerin.  His chest  pain post-PCI was mostly  resolved.   PAST MEDICAL HISTORY:  Low back pain only.  He has never had any prior  surgery.   ALLERGIES:  PENICILLIN.  He had a reaction as a child, but has been  rechallenged with penicillin as an adult and did not have further chest  pain.   MEDICATIONS:  1.  Aspirin 81 mg daily, which he took today.  2.  Motrin as needed.   SOCIAL HISTORY:  [place].  He has been unemployed for 10 years.  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  He  has a 50-pack/yr smoking history, as he smoked for about 25 years two  packs/day.  He quit in 1986.  He occasionally drinks some beer, but does not  have a significant alcohol use history.  He denies illicit drug use and  specifically denies cocaine or stimulant abuse.   FAMILY HISTORY:  Mother died of blood cancer, thought to be myeloma.  His  father died at age 15 of a cerebral aneurysm.  His brother had percutaneous  coronary intervention at age 22.  His sister also had stenting in her 40's.   REVIEW OF SYSTEMS:  No fevers, no shortness of breath, no dyspnea on  exertion, no orthopnea, no edema, no palpitations, no syncope.  No  claudications, no blood or melena per rectum.  All other systems were  negative in detail.   PHYSICAL EXAMINATION:  VITAL SIGNS:  On arrival, temperature 98, pulse 72,  blood pressure 147/86, respirations 18, saturation 97% on room air.  GENERAL:  The patient was ashen in color, but was conversant and breathing  comfortably.  His JVP was elevated at 10.  HEART:  Regular rate and rhythm with S1, S2; positive S4 and no murmur.  LUNGS:  Clear to auscultation.  ABDOMEN:  Soft and nontender.  EXTREMITIES:  Without edema.  NEUROLOGIC:  He was alert and oriented, with no obvious neurologic deficit.   CHEST X-RAY:  Pending.   EKG:  Showed normal sinus rhythm at 98; with normal axis,  normal intervals  and no Q-waves.  His R-wave progression was delayed.  He had ST elevations  (1 mm) in lead L, as well as possible hyperacute T-waves in V2-V3; with 2 mm  ST depressions inferiorly as well as in V5-V6.  No prior EKG for comparison.   LABORATORY DATA:  White blood cell count 7, hematocrit 51, platelets 156.  Sodium 137, potassium 3.0, bicarb 27, BUN 10, creatinine 1.2, glucose 252,  calcium 9.3.  CK 134.   ADDENDUM:  Further review of the patient's cardiac catheterization showed  the LAD to be small vessel.  Consequently, a 2.0 mm mini-Vision stent was  employed in the mid-LAD to treat his acute occlusion.  On further review of  his coronary angiogram, a large D2 with a side branch was acutely occluded  and then had some reperfusion.  At the time of this dictation, that vessel  was planned to be treated with balloon angioplasty.   IMPRESSION:  A 60 year old man with acute myocardial infarction due to both  LAD and D2 disease.  Acute vessel occlusion may have actually been the D2.  He is now status post percutaneous coronary intervention, with improvement  in his chest pain.   1.  ACUTE MYOCARDIAL INFARCTION.  Aspirin 325 mg daily as well as Integrilin      x18 hr.  We will start Lopressor as well as Captopril post      catheterization, and titrate these upward as his blood pressure and      heart rate tolerate.  He should be switched to a long-acting ACE      inhibitor prior to discharge.  He will monitored in the CCU.  The study      nurse was called, and the patient did not qualify for any acute MI      trials.  However, he may qualify for the Improve-It trial.      Consequently, fasting lipids are being checked and he will get      Simvastatin starting tomorrow.   1.  INCREASED BLOOD SUGAR.  We will check a fasting blood sugar in the      morning.  We will also get a hemoglobin A1c.  He is on a ADA  diet.  He     will get Accu-Chek's.  Based on these results,  further treatment can be      initiated.   1.  HYPERTENSION.  Currently unclear what the patient's baseline blood      pressure is.  He will end up going out on beta blocker and ACE inhibitor      anyway.  This needs to be followed up by a primary care physician and/or      cardiology.   PROPHYLAXIS:  GI prophylaxis.  Out of bed starting tomorrow.   DISPOSITION:  Likely three days of hospital care.  The patient should be set  up for cardiac rehabilitation.  Secondary prevention for future CAD.       LAA/MEDQ  D:  12/04/2004  T:  12/04/2004  Job:  [id number]

## 2010-09-22 NOTE — Discharge Summary (Signed)
NAMEFARMER, MCCAHILL                ACCOUNT NO.:  [id number]   MEDICAL RECORD NO.:  [id number]          PATIENT TYPE:  INP   LOCATION:  2931                         FACILITY:  [facility]   PHYSICIAN:  Arturo Morton. Riley Kill, M.D. [facility] OF BIRTH:  [birth year]   DATE OF ADMISSION:  12/04/2004  DATE OF DISCHARGE:  12/07/2004                                 DISCHARGE SUMMARY   PRINCIPAL DIAGNOSIS:  Anterior ST elevation myocardial infarction.   OTHER DIAGNOSES:  1.  Coronary artery disease.  2.  History of low back pain.  3.  Hyperlipidemia.  4.  New diagnosis of type 2 diabetes mellitus.  5.  Hypertension.  6.  Remote tobacco abuse.   ALLERGIES:  PENICILLIN.   PROCEDURES:  Left heart cardiac catheterization with percutaneous coronary  intervention and stenting of the mid-left anterior descending coronary  artery with bare metal stent.  The diagonal was angioplastied.   HISTORY OF PRESENT ILLNESS:  A 60 year old male with no prior cardiac  history, who was in his usual state of health until approximately 11:30 p.m.  on July 30, when he had relatively rapid onset of substernal chest pain and  left-sided chest discomfort radiating to the left arm and back and rated as  8/10 in severity.  He took five baby aspirin and called EMS and was taken to  [facility] emergency department, where ECG showed ST segment elevation in lead  V2 with inferior and lateral ST segment depression.  The patient was treated  with heparin and Integrilin as well as potassium chloride for a potassium of  3.0 and was transferred to [facility] for emergent cardiac catheterization.   HOSPITAL COURSE:  The patient left [facility] at 2356 on July 30 and came to  the [facility] catheterization lab at 2:36 a.m. on July 31.  Catheterization  was performed revealing a 95% lesion in the mid-LAD, which was successfully  stented with a 2.0 Mini-Vision bare metal stent.  The inferior branch of the  first diagonal, which was totally  occluded, was balloon angioplastied.  He  otherwise had insignificant coronary disease.  The patient was transferred  to the CCU for additional monitoring and was noted to be hyperglycemic, and  therefore was initiated on sliding scale insulin.  [facility]  was consulted for management of diabetes, and his hemoglobin A1c came back  at 9.8.  He was then started on Glucotrol as well as Glucophage b.i.d.  He  eventually peaked his CK at 952, his MB at 102.3, and his troponin at 27.26.  Beta blocker and ACE inhibitor were titrated for improved blood pressure  control and he was initiated on statin therapy for treatment of  hyperlipidemia with an LDL of 73.  He has been seen by cardiac rehab and was  felt to be ready for discharge today.  He has not had any recurrent symptoms  of limitations of ambulation.  Of note, the patient was enrolled in the  IMPROVE-IT research study, which is a randomized study comparing Vytorin  10/40 mg to Zocor 40 mg.   DISCHARGE LABS:  Hemoglobin  14.5, hematocrit 41.8, WBC 8.4, platelets 151.  Sodium 135, potassium 4.3, chloride 106, CO2 24, BUN 13, creatinine 1.1,  glucose 167.  PT 13.6, INR 1.0, PTT 54.  Peak CK 962, MB 102.3, troponin I  27.26.  Calcium 8.6, magnesium 2.3.  Hemoglobin A1c 9.8.   DISPOSITION:  The patient is being discharged home today in satisfactory  condition.   FOLLOW-UP PLANS AND APPOINTMENTS:  He is to follow up with Dr. Riley Kill at  [facility] on December 21, 2004, at 1:45 p.m.  He is encouraged to  establish primary care in the [place] area.   DISCHARGE MEDICATIONS:  1.  IMPROVE-IT research study drug three tablets in the evening.  2.  Aspirin 81 mg daily.  3.  Plavix 75 mg daily.  4.  Lopressor 25 mg b.i.d.  5.  Glipizide 5 mg b.i.d. before meals.  6.  Lisinopril 20 mg daily.  7.  Glucophage 500 mg b.i.d. before meals.  8.  Nitroglycerin 0.4 mg sublingually p.r.n. chest pain.   OUTSTANDING LAB STUDIES:   None.   DURATION OF DISCHARGE ENCOUNTER:  40 minutes.       CRB/MEDQ  D:  12/07/2004  T:  12/07/2004  Job:  [id number]

## 2010-09-22 NOTE — Cardiovascular Report (Signed)
[name], [name]                ACCOUNT NO.:  [id number]   MEDICAL RECORD NO.:  [id number]          PATIENT TYPE:  INP   LOCATION:  2931                         FACILITY:  [facility]   PHYSICIAN:  Edward [name], M.D. [facility] OF BIRTH:  1950-12-26   DATE OF PROCEDURE:  12/04/2004  DATE OF DISCHARGE:                              CARDIAC CATHETERIZATION   INDICATIONS:  Edward [name] is a 60 year old gentleman who presents to [facility] with an acute anterior wall infarct.  He currently has had onset of  chest pain at about 11:00 p.m. with some back pain earlier.  EKG  demonstrated some ST depression in several leads, and some hyperacute-  appearing T waves in V2 and V3.  He was brought by [facility] to the cardiac  catheterization laboratory, where he was seen by Dr. Freida Busman and myself.  We  evaluated the patient, and urgent catheterization recommended.   PROCEDURE:  1.  Left heart catheterization.  2.  Selective coronary arteriography.  3.  Selective left ventriculography.  4.  Percutaneous transluminal coronary angioplasty and stenting of the left      anterior descending artery.  5.  Percutaneous transluminal coronary angioplasty of the diagonal branch of      the left anterior descending artery.   DESCRIPTION OF PROCEDURE:  The patient was brought to the catheterization  laboratory and prepped and draped in the usual fashion.  He had been given  unfractionated heparin and was on an Integrilin drip after a single bolus.  A second bolus was subsequently given.  He was also given oral clopidogrel.  Through an anterior puncture, the right femoral artery was easily entered; a  6-French sheath was placed.  The artery was entered on a very first stick.  Following this, views of the left and right coronary arteries were obtained  in multiple angiographic projections.  Central aortic and left ventricular  pressures were measured with a pigtail.  Ventriculography was performed in  the  RAO projection.  We then made preparations for percutaneous  intervention.  A JL4 guiding catheter without sideholes, 6-French, was  utilized to intubate the left main.  The LAD has a subtotal lesion with what  appeared to be TIMI-2 flow.  A wire was taken down the LAD, and an initial  dilatation was performed.  Adequate anticoagulation was obtained.  About  this time, we were able to start visualizing a subtotally occluded diagonal  subbranch as well.  The LAD was then predilated with a 1.5-mm Voyager  balloon.  We felt that the vessel was too small to place a drug-eluting  stent and a 2-mm x 18 Mini-Vision stent was then placed across the lesion  and taken up to about 12-13 atmospheres.  Post dilatation was then  accomplished using a 2.25 Quantum Maverick balloon.  We then used a second  Hi-Torque floppy wire, and we were able to cannulate across the previously  occluded diagonal and into the distal diagonal.  A 1.5 Voyager and a 2-mm  Quantum Maverick were then utilized to do balloon dilatation at this site.  We elected  not to place a 2-mm stent, largely because of its small size and  its bifurcation location.  The 2-mm balloon was taken up to about 8  atmospheres, and there was significant improvement the appearance of the  artery.  There was TIMI-3 flow down both vessels.  With this, the procedure  was completed; all catheters were subsequently removed.  With some femoral  slight oozing, we elected to exchange the 6-French sheath for a 7-French  sheath.  There continued to be some oozing at this site despite a very clean  initial stick and lack of trauma in the groin.  This was held in the cath  lab for some time and the patient was subsequently transferred to the CCU in  satisfactory clinical condition after sewing the sheath into place.   HEMODYNAMIC DATA.:  1.  Central aortic pressure 120/80, mean 98.  2.  Left ventricular pressure 131/22.  3.  Approximate 5- to 10-mm gradient on  pullback across the aortic valve;      whether this was spurious is unclear.  4.  The coronary arteries in general have a diabetic appearance.  The      patient does have an elevated glucose of 252.  There are diffuse changes      throughout the coronary vasculature.  5.  Ventriculography was performed in the RAO projection.  Estimate of the      ejection fraction would be in the range of 50%.  There was moderate      hypokinesis of the mid and distal anterolateral wall and apex with      perhaps slightly more severe hypokinesis of the mid-anterolateral wall.  6.  The right coronary artery has a fair amount of diffuse luminal      irregularity, especially in the mid-vessel.  There is a 20% proximal      narrowing and then a 75% focal stenosis before the PDA and      posterolateral takeoff.  The vessel was fairly large in caliber, but      again, does have an appearance of diffuse atherosclerotic change      throughout.  7.  The left main is free of critical disease.  8.  The circumflex provides predominantly 3 marginal branches.  There is      perhaps 40% narrowing in the second marginal branch.  The first marginal      branch courses out all the way to the anteroapical area and is a large-      caliber vessel.  None of these branches have high-grade disease.  9.  The left anterior descending artery courses to the apex.  After the      diagonal takeoff, there is about a 50% area of narrowing and then      subsequently a 95% area of narrowing.  Beyond this, the vessel has a      diffuse narrowing with very small caliber.  The 95% stenosis was reduced      to 0% in luminal narrowing after stenting and the 2-mm stent was fairly      large compared to the rest of the vessel.  The diagonal branch is a      large-caliber vessel that bifurcates.  At the beginning of the      procedure, there was no obvious stump or bifurcation site.  As the     procedure progressed, the slow antegrade flow down  the small diagonal      became visible,  and following balloon dilatation, this was reduced to      less than 30% luminal narrowing with a restoration of TIMI-3 flow.  The      distal subbranch of the diagonal is a fairly large-caliber vessel that      has diffuse distal disease and again a diabetic appearance much like the      distal left anterior descending artery.   CONCLUSION:  1.  Acute anterior wall infarction with subtotal occlusion of the left      anterior descending with slow flow and successful stenting and total      occlusion of the diagonal subbranch, which was successfully dilated with      restoration of TIMI-3 flow.  2.  Diffuse luminal irregularities throughout the coronary vasculature      suggestive of diabetic coronary arteriopathy   PLAN:  1.  We will need to arrange for a primary care physician for long-term      diabetic management.  2.  Hemoglobin A1c will be obtained.  3.  The patient will be treated with aspirin and Plavix indefinitely.  4.  Consideration will be given to enrollment of the patient is if he      consents into the IMPROVE-IT study.  5.  I will review the films with my colleagues to make a decision about the      right coronary artery.  6.  The sheath will be removed in the coronary care unit with the ACT is      less than 150 seconds.      TDS/MEDQ  D:  12/04/2004  T:  12/04/2004  Job:  [id number]   cc:   [facility]   Marrian Salvage. Freida Busman, M.D. [facility]

## 2010-09-22 NOTE — Assessment & Plan Note (Signed)
[facility]                            [facility] OFFICE NOTE   NAME:Edward [name], Edward [name]                         MRN:          [id number]  DATE:11/30/2005                            DOB:          04-29-1951    [name] returns today for further evaluation and followup.  He is a  participant in the Improve-It research study drug.  [name] states he has  been doing quite well.  He is status post an anterior ST elevated MI back in  July 2006, at which time he received a bare metal stent of the mid-left  anterior descending coronary artery and angioplasty of the diagonal.  Mr.  Edward [name] states he has been doing well.  Denies any episodes of chest  discomfort.  Has not had to take any nitroglycerin sublingual.  Denies any  shortness of breath.  He states he becomes mildly dyspnea on exertion if he  is walking across the parking lot at [employer].  He continues to try and  remain active.  He walks between 1/2 mile to 1 mile daily.  He checks his  CBGs often.  They average around 100-110.  He denies any episodes of  presyncope or syncope, orthopnea, PND, peripheral edema, light-headedness,  or dizziness.  He is tolerating his medications without any problems.   PAST MEDICAL HISTORY:  1.  Type 2 diabetes, newly diagnosed in 2006.  2.  Hypertension.  3.  Remote history of tobacco use, quit approximately 20 years ago.  4.  History of low back pain.  5.  Hyperlipidemia.  6.  Coronary artery disease/anterior ST elevated MI, status post left heart      catheterization with percutaneous coronary intervention and bare metal      stenting of the mid-left anterior descending coronary artery and      angioplasty to the diagonal.  7.  History of allergy to PENICILLIN.   MEDICATIONS CURRENTLY:  1.  Metformin 500 mg p.o. b.i.d.  2.  Aspirin 81 mg daily.  3.  Glipizide 5 mg p.o. b.i.d.  4.  Plavix 75 mg daily.  5.  Lisinopril 20 mg daily.  6.  Metoprolol 25 mg  b.i.d.  7.  Nitroglycerin p.r.n.   REVIEW OF SYSTEMS:  As stated above in history of present illness.  Otherwise negative.   PHYSICAL EXAMINATION:  VITAL SIGNS:  Weight 172 pounds, blood pressure  100/70, with a pulse of 55.  GENERAL:  Patient in no acute distress.  Very pleasant and cooperative.  A  60 year old Caucasian gentleman.  HEENT:  Pupils equal, round, and reactive to light.  Sclera is clear.  Normocephalic, atraumatic.  NECK:  Supple, without lymphadenopathy.  Negative bruit, negative JVD.  CARDIOVASCULAR:  S1 and S2.  Regular rate and rhythm.  LUNGS:  Clear to auscultation bilaterally.  ABDOMEN:  Soft, nontender.  Positive bowel sounds.  LOWER EXTREMITIES:  Without clubbing, cyanosis, or edema.   A 12-lead EKG today showed a normal sinus bradycardia at a rate of 55.  No  acute abnormalities.   LABORATORY WORK:  Pending.  IMPRESSION:  [name] with known coronary artery disease, status post MI  approximately 1 year ago, currently being maintained on medications, as  stated above, continuing Plavix.  Blood pressure well controlled.  CBGs  averaging around 100.  Patient without any episodes of angina.  I have given  him new prescriptions for all his medications, with refills.  He will follow  up in 6 months or sooner if he has any problems, or as instructed by the  Improve-It research group.                                   Dorian Pod, ACNP   MB/MedQ  DD:  11/30/2005  DT:  11/30/2005  Job #:  [id number]   cc:   Verlan Friends, Dr.

## 2010-09-29 ENCOUNTER — Encounter: Payer: Self-pay | Admitting: *Deleted

## 2010-10-03 ENCOUNTER — Ambulatory Visit (INDEPENDENT_AMBULATORY_CARE_PROVIDER_SITE_OTHER): Payer: BC Managed Care – PPO | Admitting: Cardiology

## 2010-10-03 ENCOUNTER — Encounter: Payer: Self-pay | Admitting: Cardiology

## 2010-10-03 DIAGNOSIS — I2510 Atherosclerotic heart disease of native coronary artery without angina pectoris: Secondary | ICD-10-CM

## 2010-10-03 DIAGNOSIS — E785 Hyperlipidemia, unspecified: Secondary | ICD-10-CM

## 2010-10-03 NOTE — Progress Notes (Signed)
HPI The patient is a 60 year old male with a history of coronary artery disease, status post anterior wall myocardial infarction in July 2006 treated with drug-eluting stent to the LAD. At that time he had a residual 75% distal right coronary artery lesion. He also preserved LV function with an ejection fraction of 50%. He has diabetes mellitus, hypertension and dyslipidemia as additional cardiac risk factors. His last office visit was in October of 2011. He had a Cardiolite study done in 2010 which was within normal limits. At that time he was doing well from a cardiovascular perspective. Plavix was previously discontinued. The patient has been doing well. He reports no recurrent chest pain shortness of breath orthopnea PND. He has not required any nitroglycerin. He continues to be followed in refill for to prove it study  No Known Allergies  Current Outpatient Prescriptions on File Prior to Visit  Medication Sig Dispense Refill  . aspirin 81 MG tablet Take 81 mg by mouth daily.        . fish oil-omega-3 fatty acids 1000 MG capsule Take 1 g by mouth 2 (two) times daily.        [name] glipiZIDE (GLUCOTROL) 5 MG tablet Take 5 mg by mouth 2 (two) times daily before a meal.        . lisinopril (PRINIVIL,ZESTRIL) 20 MG tablet Take 20 mg by mouth daily.        . metFORMIN (GLUCOPHAGE) 500 MG tablet Take 500 mg by mouth 2 (two) times daily with a meal.        . metoprolol tartrate (LOPRESSOR) 25 MG tablet Take 25 mg by mouth 2 (two) times daily.        . nitroGLYCERIN (NITROSTAT) 0.4 MG SL tablet Place 0.4 mg under the tongue every 5 (five) minutes as needed.        . zolpidem (AMBIEN) 10 MG tablet Take 10 mg by mouth at bedtime as needed.          No past medical history on file.  No past surgical history on file.  No family history on file.  History   Social History  . Marital Status: Married    Spouse Name: N/A    Number of Children: N/A  . Years of Education: N/A   Occupational History  .  Not on file.   Social History Main Topics  . Smoking status: Former Smoker -- 2.0 packs/day for 20 years    Types: Cigarettes    Quit date: 05/08/1987  . Smokeless tobacco: Never Used  . Alcohol Use: Not on file  . Drug Use: Not on file  . Sexually Active: Not on file   Other Topics Concern  . Not on file   Social History Narrative  . No narrative on file    PHYSICAL EXAM BP 123/75  Pulse 56  Ht 5\' 10"  (1.778 m)  Wt 176 lb (79.833 kg)  BMI 25.25 kg/m2  SpO2 95%  General: Well-developed, well-nourished in no distress Head: Normocephalic and atraumatic Eyes:PERRLA/EOMI intact, conjunctiva and lids normal Ears: No deformity or lesions Mouth:normal dentition, normal posterior pharynx Neck: Supple, no JVD.  No masses, thyromegaly or abnormal cervical nodes Lungs: Normal breath sounds bilaterally without wheezing.  Normal percussion Cardiac: regular rate and rhythm with normal S1 and S2, no S3 or S4.  PMI is normal.  No pathological murmurs Abdomen: Normal bowel sounds, abdomen is soft and nontender without masses, organomegaly or hernias noted.  No hepatosplenomegaly MSK: Back normal, normal gait  muscle strength and tone normal Vascular: Pulse is normal in all 4 extremities Extremities: No peripheral pitting edema Neurologic: Alert and oriented x 3 Skin: Intact without lesions or rashes Lymphatics: No significant adenopathy Psychologic: Normal affect  ECG:NA  ASSESSMENT AND PLAN

## 2010-10-03 NOTE — Assessment & Plan Note (Signed)
In PROVE-IT study

## 2010-10-03 NOTE — Assessment & Plan Note (Addendum)
Coronary artery disease status post anterior wall myocardial infarction: Cardiolite study 2010 no ischemia: No recurrent chest pain continue current medical regimen. Status post drug-eluting stent to the LAD: Patient aspirin only Plavix discontinued Ejection fraction 50%

## 2010-10-03 NOTE — Patient Instructions (Signed)
Continue all current medications. Your physician wants you to follow up in:  1 year.  You will receive a reminder letter in the mail one-two months in advance.  If you don't receive a letter, please call our office to schedule the follow up appointment   

## 2010-10-18 ENCOUNTER — Encounter: Payer: Self-pay | Admitting: Cardiology

## 2010-11-15 ENCOUNTER — Encounter: Payer: Self-pay | Admitting: Cardiology

## 2010-11-21 ENCOUNTER — Encounter (INDEPENDENT_AMBULATORY_CARE_PROVIDER_SITE_OTHER): Payer: BC Managed Care – PPO | Admitting: Cardiology

## 2010-11-21 ENCOUNTER — Other Ambulatory Visit: Payer: Self-pay | Admitting: *Deleted

## 2010-11-21 ENCOUNTER — Encounter (INDEPENDENT_AMBULATORY_CARE_PROVIDER_SITE_OTHER): Payer: BC Managed Care – PPO

## 2010-11-21 ENCOUNTER — Encounter: Payer: Self-pay | Admitting: Cardiology

## 2010-11-21 DIAGNOSIS — R0989 Other specified symptoms and signs involving the circulatory and respiratory systems: Secondary | ICD-10-CM

## 2010-11-21 MED ORDER — LISINOPRIL 20 MG PO TABS: 20.0000 mg | ORAL_TABLET | Freq: Every day | ORAL | Status: DC

## 2010-11-21 MED ORDER — NITROGLYCERIN 0.4 MG SL SUBL: 0.4000 mg | SUBLINGUAL_TABLET | SUBLINGUAL | Status: DC | PRN

## 2010-11-21 MED ORDER — METOPROLOL TARTRATE 25 MG PO TABS: 25.0000 mg | ORAL_TABLET | Freq: Two times a day (BID) | ORAL | Status: DC

## 2010-11-26 ENCOUNTER — Encounter: Payer: Self-pay | Admitting: Cardiology

## 2010-11-28 ENCOUNTER — Other Ambulatory Visit: Payer: Self-pay | Admitting: Cardiology

## 2010-11-29 NOTE — Telephone Encounter (Signed)
[facility] patient 

## 2010-12-21 NOTE — Progress Notes (Signed)
Pt seen in conjunction with research protocol.  Documentation completed on forms provided by sponsor.  This encounter was created in error - please disregard.

## 2011-03-06 ENCOUNTER — Other Ambulatory Visit: Payer: Self-pay | Admitting: Cardiology

## 2011-03-27 ENCOUNTER — Other Ambulatory Visit: Payer: Self-pay | Admitting: Cardiology

## 2011-03-28 ENCOUNTER — Other Ambulatory Visit: Payer: Self-pay | Admitting: Cardiology

## 2011-03-28 NOTE — Telephone Encounter (Signed)
**Note De-identified  Obfuscation** [facility] pt. 

## 2011-03-28 NOTE — Telephone Encounter (Signed)
[facility] pt. 

## 2011-03-30 ENCOUNTER — Other Ambulatory Visit: Payer: Self-pay | Admitting: Cardiology

## 2011-06-26 ENCOUNTER — Other Ambulatory Visit: Payer: Self-pay | Admitting: Cardiology

## 2011-06-27 ENCOUNTER — Other Ambulatory Visit: Payer: Self-pay | Admitting: Cardiology

## 2011-06-28 ENCOUNTER — Telehealth: Payer: Self-pay | Admitting: *Deleted

## 2011-06-28 NOTE — Telephone Encounter (Signed)
Nurse called and explained to patient that [facility] informed twice to forward this request to family doctor since we don't manage diabetes. Nurse informed patient that response faxed to pharmacy and nurse called and spoke with [facility] on yesterday. Patient verbalized understanding.

## 2011-06-29 ENCOUNTER — Other Ambulatory Visit: Payer: Self-pay | Admitting: Cardiology

## 2011-07-20 ENCOUNTER — Encounter: Payer: Self-pay | Admitting: *Deleted

## 2011-10-02 ENCOUNTER — Encounter: Payer: Self-pay | Admitting: Cardiology

## 2011-10-02 ENCOUNTER — Ambulatory Visit (INDEPENDENT_AMBULATORY_CARE_PROVIDER_SITE_OTHER): Payer: Medicaid Other | Admitting: Cardiology

## 2011-10-02 VITALS — BP 124/81 | HR 57 | Ht 70.0 in | Wt 183.0 lb

## 2011-10-02 DIAGNOSIS — I2510 Atherosclerotic heart disease of native coronary artery without angina pectoris: Secondary | ICD-10-CM

## 2011-10-02 DIAGNOSIS — G4700 Insomnia, unspecified: Secondary | ICD-10-CM

## 2011-10-02 MED ORDER — LISINOPRIL 20 MG PO TABS: 20.0000 mg | ORAL_TABLET | Freq: Every day | ORAL | Status: DC

## 2011-10-02 MED ORDER — TRAZODONE HCL 100 MG PO TABS: 100.0000 mg | ORAL_TABLET | Freq: Every day | ORAL | Status: DC

## 2011-10-02 MED ORDER — METOPROLOL TARTRATE 25 MG PO TABS: 50.0000 mg | ORAL_TABLET | Freq: Two times a day (BID) | ORAL | Status: DC

## 2011-10-02 NOTE — Assessment & Plan Note (Signed)
Change from ambien to trazodone 50mg  po qdaily and increase to 100mg  if needed.

## 2011-10-02 NOTE — Patient Instructions (Signed)
   Stop Ambien  Begin Trazodone 100mg  every evening Your physician wants you to follow up in: 6 months.  You will receive a reminder letter in the mail one-two months in advance.  If you don't receive a letter, please call our office to schedule the follow up appointment

## 2011-10-02 NOTE — Assessment & Plan Note (Signed)
No ischemia symptoms. No scp. cardiolite 2010. Continue current meds

## 2011-10-02 NOTE — Progress Notes (Signed)
Edward Bottoms, MD, [facility] ABIM Board Certified in Adult Cardiovascular Medicine,Internal Medicine and Critical Care Medicine    CC: Followup patient with a history of coronary artery disease  HPI:  The patient has a history of coronary artery disease and stent to the mid LAD in June of 2006. Has multiple cardiac risk factors. He denies however any chest pain. Had a Cardiolite done in 2010 which was negative he has good exercise tolerance. He has no orthopnea or PND. He reports no palpitations. He reports no presyncope or syncope. He has no other cardiovascular complaints.   PMH: reviewed and listed in Problem List in Electronic Records (and see below) Past Medical History  Diagnosis Date  . H/O atherosclerotic cardiovascular disease     AMI in July 2006, BMS LAD; residual 75% distal RCA; EF 50%  . Type 2 diabetes mellitus   . Unspecified essential hypertension     Cardiolite 2010 no ischemia  . Other and unspecified hyperlipidemia   . Low back pain   . Tobacco abuse    Past Surgical History  Procedure Date  . Cardiac catheterization     stenting of the mid left anterior decending coronary artery with bare metal stent    Allergies/SH/FHX : available in Electronic Records for review  No Known Allergies History   Social History  . Marital Status: Married    Spouse Name: N/A    Number of Children: N/A  . Years of Education: N/A   Occupational History  . Not on file.   Social History Main Topics  . Smoking status: Former Smoker -- 2.0 packs/day for 20 years    Types: Cigarettes    Quit date: 05/08/1987  . Smokeless tobacco: Never Used  . Alcohol Use: Not on file  . Drug Use: Not on file  . Sexually Active: Not on file   Other Topics Concern  . Not on file   Social History Narrative  . No narrative on file   Family History  Problem Relation Age of Onset  . Aneurysm Father     brain aneurysm  . Lung cancer Mother     small cell carcinoma of lung,kidney and  heart    Medications: Current Outpatient Prescriptions  Medication Sig Dispense Refill  . aspirin 81 MG tablet Take 81 mg by mouth daily.        . Cholecalciferol (VITAMIN D3) 2000 UNITS TABS Take 1 tablet by mouth daily.      . fish oil-omega-3 fatty acids 1000 MG capsule Take 1 g by mouth 2 (two) times daily.        [name] glipiZIDE (GLUCOTROL) 5 MG tablet Take 5 mg by mouth 2 (two) times daily before a meal.        . lisinopril (PRINIVIL,ZESTRIL) 20 MG tablet Take 1 tablet (20 mg total) by mouth daily.  30 tablet  6  . metFORMIN (GLUCOPHAGE) 500 MG tablet TAKE ONE TABLET BY MOUTH TWICE DAILY  60 tablet  6  . metoprolol tartrate (LOPRESSOR) 25 MG tablet Take 2 tablets (50 mg total) by mouth 2 (two) times daily.  60 tablet  6  . nitroGLYCERIN (NITROSTAT) 0.4 MG SL tablet Place 1 tablet (0.4 mg total) under the tongue every 5 (five) minutes as needed.  90 tablet  0  . NON FORMULARY RESEARCH DRUG Use as directed       . DISCONTD: lisinopril (PRINIVIL,ZESTRIL) 20 MG tablet TAKE ONE TABLET BY MOUTH EVERY DAY  30 tablet  6  . DISCONTD: metoprolol tartrate (LOPRESSOR) 25 MG tablet TAKE ONE TABLET BY MOUTH TWICE DAILY  60 tablet  6  . traZODone (DESYREL) 100 MG tablet Take 1 tablet (100 mg total) by mouth at bedtime.  30 tablet  4    ROS: No nausea or vomiting. No fever or chills.No melena or hematochezia.No bleeding.No claudication  Physical Exam: BP 124/81  Pulse 57  Ht 5\' 10"  (1.778 m)  Wt 183 lb (83.008 kg)  BMI 26.26 kg/m2 General: Well-nourished white male in no distress. Neck: Normal carotid upstroke no carotid bruits. No thyromegaly. No nodular thyroid Lungs: Clear breath sounds bilaterally without wheezing.  Cardiac:Regular rate and rhythm normal S1-S2 no murmurs or gallops Vascular: No edema. Normal distal pulses. Skin: Warm and dry Physcologic: Normal affect  12lead ECG: Normal sinus rhythm Q wave in V1 otherwise normal tracing Limited bedside ECHO:N/A No images are attached to  the encounter.   Assessment and Plan  ATHEROSCLEROTIC CARDIOVASCULAR DISEASE No ischemia symptoms. No scp. cardiolite 2010. Continue current meds  INSOMNIA, CHRONIC Change from ambien to trazodone 50mg  po qdaily and increase to 100mg  if needed.     Patient Active Problem List  Diagnoses  . DIABETES MELLITUS, TYPE II-NO INSULIN  . HYPERLIPIDEMIA  . INSOMNIA, CHRONIC  . HYPERTENSION  . ATHEROSCLEROTIC CARDIOVASCULAR DISEASE  . Coronary artery disease

## 2011-10-04 ENCOUNTER — Other Ambulatory Visit: Payer: Self-pay | Admitting: *Deleted

## 2011-10-04 MED ORDER — METOPROLOL TARTRATE 50 MG PO TABS: 50.0000 mg | ORAL_TABLET | Freq: Two times a day (BID) | ORAL | Status: DC

## 2011-10-04 MED ORDER — METOPROLOL TARTRATE 25 MG PO TABS: 25.0000 mg | ORAL_TABLET | Freq: Two times a day (BID) | ORAL | Status: DC

## 2011-10-04 NOTE — Telephone Encounter (Signed)
Called [facility] [place] and corrected metoprolol tartrate dose,and quantity and patient informed.

## 2011-10-04 NOTE — Telephone Encounter (Signed)
Patient walked into office to show nurse that wrong directions sent to pharmacy. Original bottle of lopressor was for 25 mg twice daily. MD never increased dose per office note and patient. Nurse apologized to patient for error and corrected directions on bottle and informed them that correct directions and dose would be sent to pharmacy.

## 2011-11-27 ENCOUNTER — Other Ambulatory Visit: Payer: Medicaid Other

## 2011-11-27 ENCOUNTER — Ambulatory Visit (INDEPENDENT_AMBULATORY_CARE_PROVIDER_SITE_OTHER): Payer: Medicaid Other | Admitting: Cardiology

## 2011-11-27 ENCOUNTER — Encounter: Payer: Self-pay | Admitting: Cardiology

## 2011-11-27 VITALS — BP 118/70 | HR 58 | Ht 70.0 in | Wt 183.0 lb

## 2011-11-27 DIAGNOSIS — I2510 Atherosclerotic heart disease of native coronary artery without angina pectoris: Secondary | ICD-10-CM

## 2011-11-27 NOTE — Progress Notes (Deleted)
Name: Edward [name]    DOB: 1951/02/08  Age: 61 y.o.  MR#: [id number]       PCP:  Rudi Heap, MD      Insurance: @PAYORNAME @   CC:    Chief Complaint  Patient presents with  . CAD    No c/o - Research Visit - Med bottles- TC  . Hypercholesterolemia  . CAD    VS BP 118/70  Pulse 58  Ht 5\' 10"  (1.778 m)  Wt 183 lb (83.008 kg)  BMI 26.26 kg/m2  Weights Current Weight  11/27/11 183 lb (83.008 kg)  10/02/11 183 lb (83.008 kg)  11/21/10 173 lb (78.472 kg)    Blood Pressure  BP Readings from Last 3 Encounters:  11/27/11 118/70  10/02/11 124/81  11/21/10 126/83     Admit date:  (Not on file) Last encounter with RMR:  Visit date not found   Allergy No Known Allergies  Current Outpatient Prescriptions  Medication Sig Dispense Refill  . aspirin 81 MG tablet Take 81 mg by mouth daily.        . Cholecalciferol (VITAMIN D3) 2000 UNITS TABS Take 1 tablet by mouth daily.      . Choline Fenofibrate (TRILIPIX) 135 MG capsule Take 135 mg by mouth daily.      . fish oil-omega-3 fatty acids 1000 MG capsule Take 1 g by mouth 2 (two) times daily.        [name] glipiZIDE (GLUCOTROL) 5 MG tablet Take 5 mg by mouth 2 (two) times daily before a meal.        . lisinopril (PRINIVIL,ZESTRIL) 20 MG tablet Take 1 tablet (20 mg total) by mouth daily.  30 tablet  6  . metFORMIN (GLUCOPHAGE) 500 MG tablet TAKE ONE TABLET BY MOUTH TWICE DAILY  60 tablet  6  . metoprolol tartrate (LOPRESSOR) 25 MG tablet Take 1 tablet (25 mg total) by mouth 2 (two) times daily.  60 tablet  6  . nitroGLYCERIN (NITROSTAT) 0.4 MG SL tablet Place 1 tablet (0.4 mg total) under the tongue every 5 (five) minutes as needed.  90 tablet  0  . NON FORMULARY RESEARCH DRUG Use as directed       . traZODone (DESYREL) 100 MG tablet Take 1 tablet (100 mg total) by mouth at bedtime.  30 tablet  4    Discontinued Meds:   There are no discontinued medications.  Patient Active Problem List  Diagnosis  . DIABETES MELLITUS, TYPE II-NO  INSULIN  . HYPERLIPIDEMIA  . INSOMNIA, CHRONIC  . HYPERTENSION  . ATHEROSCLEROTIC CARDIOVASCULAR DISEASE  . Coronary artery disease    LABS No visits with results within 3 Month(s) from this visit. Latest known visit with results is:  [facility] Conversion Encounter on 11/25/2009  Component Date Value  . Glucose, Bld 11/12/2007 71   . BUN 11/12/2007 19   . Creatinine, Ser 11/12/2007 1.4   . Sodium 11/12/2007 139   . Chloride 11/12/2007 103   . CO2 11/12/2007 24   . AST 11/12/2007 23   . ALT 11/12/2007 22   . Total Bilirubin 11/12/2007 0.8   . Alkaline Phosphatase 11/12/2007 37   . Calcium 11/12/2007 10.1   . Total Protein 11/12/2007 7.2   . Albumin 11/12/2007 4.7   . WBC 11/12/2007 6.5   . Hemoglobin 11/12/2007 15.6   . HCT 11/12/2007 45.6   . Platelets 11/12/2007 161      Results for this Opt Visit:     Results for orders  placed in visit on 11/25/09  CONVERTED [facility] LAB      Component Value Range   Glucose, Bld 71     BUN 19     Creatinine, Ser 1.4     Sodium 139     Chloride 103     CO2 24     AST 23     ALT 22     Total Bilirubin 0.8     Alkaline Phosphatase 37     Calcium 10.1     Total Protein 7.2     Albumin 4.7     WBC 6.5     Hemoglobin 15.6     HCT 45.6     Platelets 161      EKG Orders placed in visit on 10/02/11  . EKG 12-LEAD     Prior Assessment and Plan Problem List as of 11/27/2011            Cardiology Problems   Coronary artery disease   Last Assessment & Plan Note   10/03/2010 Office Visit Addendum 10/03/2010 11:47 AM by June Leap, MD    Coronary artery disease status post anterior wall myocardial infarction: Cardiolite study 2010 no ischemia: No recurrent chest pain continue current medical regimen. Status post drug-eluting stent to the LAD: Patient aspirin only Plavix discontinued Ejection fraction 50%     HYPERLIPIDEMIA   Last Assessment & Plan Note   10/03/2010 Office Visit Signed 10/03/2010 11:47 AM by June Leap, MD     In PROVE-IT study    HYPERTENSION   ATHEROSCLEROTIC CARDIOVASCULAR DISEASE   Last Assessment & Plan Note   10/02/2011 Office Visit Signed 10/02/2011  9:03 AM by June Leap, MD    No ischemia symptoms. No scp. cardiolite 2010. Continue current meds      Other   DIABETES MELLITUS, TYPE II-NO INSULIN   INSOMNIA, CHRONIC   Last Assessment & Plan Note   10/02/2011 Office Visit Signed 10/02/2011  9:04 AM by June Leap, MD    Change from [place] to trazodone 50mg  po qdaily and increase to 100mg  if needed.         Imaging: No results found.   FRS Calculation: Score not calculated. Missing: Total Cholesterol, HDL

## 2011-12-02 NOTE — Assessment & Plan Note (Signed)
Research Study Visit.

## 2011-12-02 NOTE — Progress Notes (Signed)
Patient ID: Edward [name], male   DOB: 05-30-1950, 61 y.o.   MRN: [id number]  Research Visit-Study documentation completed.

## 2012-03-31 ENCOUNTER — Encounter: Payer: Self-pay | Admitting: Cardiology

## 2012-03-31 ENCOUNTER — Ambulatory Visit (INDEPENDENT_AMBULATORY_CARE_PROVIDER_SITE_OTHER): Payer: Medicaid Other | Admitting: Cardiology

## 2012-03-31 VITALS — BP 130/86 | HR 71 | Ht 70.0 in | Wt 183.8 lb

## 2012-03-31 DIAGNOSIS — I10 Essential (primary) hypertension: Secondary | ICD-10-CM

## 2012-03-31 DIAGNOSIS — E785 Hyperlipidemia, unspecified: Secondary | ICD-10-CM

## 2012-03-31 DIAGNOSIS — I2510 Atherosclerotic heart disease of native coronary artery without angina pectoris: Secondary | ICD-10-CM

## 2012-03-31 NOTE — Progress Notes (Signed)
Clinical Summary Mr. Costabile is a 61 y.o.male presenting for followup. He is a former patient of Dr. Andee Lineman, prefers to stay with [facility]. He was last seen in May. Records indicate a low risk Cardiolite from 2010, element of anterior scar with no ischemia, LVEF 64%.  He is here with his wife. He reports no angina symptoms, has not required any nitroglycerin. Last ECG reviewed and stable. He has lipid followup through his primary care provider. He has been on omega-3 supplements, not on a statin.  No Known Allergies  Current Outpatient Prescriptions  Medication Sig Dispense Refill  . aspirin 81 MG tablet Take 81 mg by mouth daily.        . brimonidine (ALPHAGAN) 0.15 % ophthalmic solution Place 1 drop into both eyes 2 (two) times daily.      . Cholecalciferol (VITAMIN D3) 2000 UNITS TABS Take 1 tablet by mouth daily.      . fish oil-omega-3 fatty acids 1000 MG capsule Take 1 g by mouth 2 (two) times daily.        [name] glipiZIDE (GLUCOTROL) 5 MG tablet Take 5 mg by mouth 2 (two) times daily before a meal.        . lisinopril (PRINIVIL,ZESTRIL) 20 MG tablet Take 1 tablet (20 mg total) by mouth daily.  30 tablet  6  . metFORMIN (GLUCOPHAGE) 500 MG tablet TAKE ONE TABLET BY MOUTH TWICE DAILY  60 tablet  6  . metoprolol tartrate (LOPRESSOR) 25 MG tablet Take 1 tablet (25 mg total) by mouth 2 (two) times daily.  60 tablet  6  . nitroGLYCERIN (NITROSTAT) 0.4 MG SL tablet Place 1 tablet (0.4 mg total) under the tongue every 5 (five) minutes as needed.  90 tablet  0  . NON FORMULARY RESEARCH DRUG Use as directed       . traZODone (DESYREL) 100 MG tablet Take 1 tablet (100 mg total) by mouth at bedtime.  30 tablet  4    Past Medical History  Diagnosis Date  . Coronary atherosclerosis of native coronary artery     BMS LAD 2006; residual 75% distal RCA; EF 50%  . Type 2 diabetes mellitus   . Essential hypertension, benign   . Mixed hyperlipidemia   . Low back pain   . MI (myocardial infarction)     Anterior 2006    Social History Mr. Mannor reports that he quit smoking about 24 years ago. His smoking use included Cigarettes. He has a 40 pack-year smoking history. He has never used smokeless tobacco. Mr. Yepiz reports that he does not drink alcohol.  Review of Systems No palpitations, no reported bleeding problems. No claudication. No orthopnea or PND. Stable appetite. Otherwise negative.  Physical Examination Filed Vitals:   03/31/12 1314  BP: 130/86  Pulse: 71   Filed Weights   03/31/12 1314  Weight: 183 lb 12.8 oz (83.371 kg)   No acute distress. HEENT: Conjunctiva and lids normal, oropharynx clear. Neck: Supple, no elevated JVP or carotid bruits, no thyromegaly. Lungs: Clear to auscultation, nonlabored breathing at rest. Cardiac: Regular rate and rhythm, no S3 or significant systolic murmur, no pericardial rub. Abdomen: Soft, nontender, no hepatomegaly, bowel sounds present, no guarding or rebound. Extremities: No pitting edema, distal pulses 2+.   Problem List and Plan   Coronary atherosclerosis of native coronary artery No active angina, has been stable on medical therapy. At this point continue observation. We discussed warning signs. Might consider a followup stress test within the  next year.  Essential hypertension, benign No change to current regimen.  HYPERLIPIDEMIA Will request most recent lipid numbers from primary care provider.    Jonelle Sidle, M.D., F.A.C.C.

## 2012-03-31 NOTE — Assessment & Plan Note (Signed)
No active angina, has been stable on medical therapy. At this point continue observation. We discussed warning signs. Might consider a followup stress test within the next year.

## 2012-03-31 NOTE — Assessment & Plan Note (Addendum)
Will request most recent lipid numbers from primary care provider.

## 2012-03-31 NOTE — Patient Instructions (Addendum)

## 2012-03-31 NOTE — Assessment & Plan Note (Signed)
No change to current regimen. 

## 2012-09-02 ENCOUNTER — Encounter: Payer: Self-pay | Admitting: *Deleted

## 2012-09-25 ENCOUNTER — Ambulatory Visit (INDEPENDENT_AMBULATORY_CARE_PROVIDER_SITE_OTHER): Payer: Medicaid Other | Admitting: Nurse Practitioner

## 2012-09-25 ENCOUNTER — Encounter: Payer: Self-pay | Admitting: Nurse Practitioner

## 2012-09-25 VITALS — BP 124/70 | HR 57 | Temp 97.0°F | Ht 70.0 in | Wt 180.0 lb

## 2012-09-25 DIAGNOSIS — E119 Type 2 diabetes mellitus without complications: Secondary | ICD-10-CM

## 2012-09-25 DIAGNOSIS — G4700 Insomnia, unspecified: Secondary | ICD-10-CM

## 2012-09-25 DIAGNOSIS — E785 Hyperlipidemia, unspecified: Secondary | ICD-10-CM

## 2012-09-25 DIAGNOSIS — I10 Essential (primary) hypertension: Secondary | ICD-10-CM

## 2012-09-25 LAB — COMPLETE METABOLIC PANEL WITH GFR
AST: 26 U/L (ref 0–37)
Albumin: 4.8 g/dL (ref 3.5–5.2)
Alkaline Phosphatase: 28 U/L — ABNORMAL LOW (ref 39–117)
BUN: 24 mg/dL — ABNORMAL HIGH (ref 6–23)
Calcium: 10 mg/dL (ref 8.4–10.5)
Chloride: 102 mEq/L (ref 96–112)
GFR, Est Non African American: 32 mL/min — ABNORMAL LOW
Glucose, Bld: 178 mg/dL — ABNORMAL HIGH (ref 70–99)
Potassium: 5.7 mEq/L — ABNORMAL HIGH (ref 3.5–5.3)
Sodium: 135 mEq/L (ref 135–145)
Total Protein: 7.5 g/dL (ref 6.0–8.3)

## 2012-09-25 MED ORDER — METOPROLOL TARTRATE 25 MG PO TABS: 25.0000 mg | ORAL_TABLET | Freq: Two times a day (BID) | ORAL | Status: DC

## 2012-09-25 MED ORDER — SIMVASTATIN 40 MG PO TABS: 40.0000 mg | ORAL_TABLET | Freq: Every day | ORAL | Status: DC

## 2012-09-25 MED ORDER — TRAZODONE HCL 100 MG PO TABS: 100.0000 mg | ORAL_TABLET | Freq: Every day | ORAL | Status: DC

## 2012-09-25 MED ORDER — GLIMEPIRIDE 4 MG PO TABS: 4.0000 mg | ORAL_TABLET | Freq: Every day | ORAL | Status: DC

## 2012-09-25 MED ORDER — LISINOPRIL 20 MG PO TABS: 20.0000 mg | ORAL_TABLET | Freq: Every day | ORAL | Status: DC

## 2012-09-25 MED ORDER — GLIPIZIDE 5 MG PO TABS: 5.0000 mg | ORAL_TABLET | Freq: Two times a day (BID) | ORAL | Status: DC

## 2012-09-25 NOTE — Progress Notes (Signed)
Subjective:    Patient ID: Edward [name], male    DOB: 09-30-1950, 62 y.o.   MRN: [id number]  Diabetes He presents for his follow-up diabetic visit. He has type 2 diabetes mellitus. No MedicAlert identification noted. The initial diagnosis of diabetes was made 8 years ago. His disease course has been stable. There are no hypoglycemic associated symptoms. Pertinent negatives for hypoglycemia include no headaches. Pertinent negatives for diabetes include no blurred vision, no chest pain, no fatigue, no foot paresthesias, no polydipsia, no polyphagia, no visual change and no weight loss. There are no hypoglycemic complications. Symptoms are stable. Diabetic complications include heart disease. Risk factors for coronary artery disease include dyslipidemia, hypertension and male sex. Current diabetic treatment includes oral agent (dual therapy). He is compliant with treatment all of the time. His weight is stable. When asked about meal planning, he reported none. He has not had a previous visit with a dietician. He rarely participates in exercise. There is no change in his home blood glucose trend. His breakfast blood glucose is taken between 7-8 am. His breakfast blood glucose range is generally 110-130 mg/dl. An ACE inhibitor/angiotensin II receptor blocker is being taken. He sees a podiatrist.Eye exam is current (January 2014).  Hypertension This is a chronic problem. The current episode started more than 1 year ago. The problem is unchanged. The problem is controlled. Pertinent negatives include no blurred vision, chest pain, headaches, orthopnea, palpitations, peripheral edema or shortness of breath. There are no associated agents to hypertension. Risk factors for coronary artery disease include male gender, dyslipidemia and diabetes mellitus. Past treatments include beta blockers and ACE inhibitors. The current treatment provides significant improvement. There are no compliance problems.  Hypertensive  end-organ damage includes CAD/MI (2006).  Hyperlipidemia This is a chronic problem. The current episode started more than 1 year ago. The problem is controlled. Recent lipid tests were reviewed and are normal. Exacerbating diseases include diabetes. Pertinent negatives include no chest pain, leg pain, myalgias or shortness of breath. He is currently on no antihyperlipidemic treatment. There are no compliance problems.  Risk factors for coronary artery disease include diabetes mellitus, hypertension and male sex.   * Patient is in an Improve it Study- Lipid management study- Patient was either oin Vytorin or Zocor 40- He is currently on nothing from study- Cherrie Distance from the study was in to draw labs from patient and requested that patient be put on Zocor 40mg  and repeat labs in 3 months,. They will pull information needed off of epic as labs are completed.- This is the end of study.  Review of Systems  Constitutional: Negative for weight loss and fatigue.  Eyes: Negative for blurred vision.  Respiratory: Negative for shortness of breath.   Cardiovascular: Negative for chest pain, palpitations and orthopnea.  Endocrine: Negative for polydipsia and polyphagia.  Musculoskeletal: Negative for myalgias.  Neurological: Negative for headaches.  All other systems reviewed and are negative.       Objective:   Physical Exam  Constitutional: He is oriented to person, place, and time. He appears well-developed and well-nourished.  HENT:  Head: Normocephalic.  Right Ear: External ear normal.  Left Ear: External ear normal.  Nose: Nose normal.  Mouth/Throat: Oropharynx is clear and moist.  Eyes: EOM are normal. Pupils are equal, round, and reactive to light.  Neck: Normal range of motion. Neck supple. No thyromegaly present.  Cardiovascular: Normal rate, regular rhythm, normal heart sounds and intact distal pulses.   No murmur heard. Pulmonary/Chest: Effort  normal and breath sounds normal. He has  no wheezes. He has no rales.  Abdominal: Soft. Bowel sounds are normal.  Musculoskeletal: Normal range of motion.  Neurological: He is alert and oriented to person, place, and time.  Skin: Skin is warm and dry.  Psychiatric: He has a normal mood and affect. His behavior is normal. Judgment and thought content normal.   See diabetic foot exam. BP 124/70  Pulse 57  Temp(Src) 97 F (36.1 C) (Oral)  Ht 5\' 10"  (1.778 m)  Wt 180 lb (81.647 kg)  BMI 25.83 kg/m2  Results for orders placed in visit on 09/25/12  POCT GLYCOSYLATED HEMOGLOBIN (HGB A1C)      Result Value Range   Hemoglobin A1C 7.7%           Assessment & Plan:  1. Type II or unspecified type diabetes mellitus without mention of complication, not stated as uncontrolled Strict counting carbs - POCT glycosylated hemoglobin (Hb A1C) - glimepiride (AMARYL) 4 MG tablet; Take 1 tablet (4 mg total) by mouth daily before breakfast.  Dispense: 30 tablet; Refill: 5 - glipiZIDE (GLUCOTROL) 5 MG tablet; Take 1 tablet (5 mg total) by mouth 2 (two) times daily before a meal.  Dispense: 30 tablet; Refill: 5 - metoprolol tartrate (LOPRESSOR) 25 MG tablet; Take 1 tablet (25 mg total) by mouth 2 (two) times daily.  Dispense: 60 tablet; Refill: 5  2. Hypertension Low Na+ diet - COMPLETE METABOLIC PANEL WITH GFR - lisinopril (PRINIVIL,ZESTRIL) 20 MG tablet; Take 1 tablet (20 mg total) by mouth daily.  Dispense: 30 tablet; Refill: 6  3. Hyperlipidemia Low fat diet an dexercise - NMR Lipoprofile with Lipids - simvastatin (ZOCOR) 40 MG tablet; Take 1 tablet (40 mg total) by mouth at bedtime.  Dispense: 30 tablet; Refill: 5  4. Insomnia Bedtime ritual - traZODone (DESYREL) 100 MG tablet; Take 1 tablet (100 mg total) by mouth at bedtime.  Dispense: 30 tablet; Refill: 5  Mary-Margaret Daphine Deutscher, FNP

## 2012-09-25 NOTE — Patient Instructions (Signed)
1800 Calorie Diet for Diabetes Meal Planning The 1800 calorie diet is designed for eating up to 1800 calories each day. Following this diet and making healthy meal choices can help improve overall health. This diet controls blood sugar (glucose) levels and can also help lower blood pressure and cholesterol. SERVING SIZES Measuring foods and serving sizes helps to make sure you are getting the right amount of food. The list below tells how big or small some common serving sizes are:  1 oz.........4 stacked dice.  3 oz.........Deck of cards.  1 tsp........Tip of little finger.  1 tbs........Thumb.  2 tbs........Golf ball.   cup.......Half of a fist.  1 cup........A fist. GUIDELINES FOR CHOOSING FOODS The goal of this diet is to eat a variety of foods and limit calories to 1800 each day. This can be done by choosing foods that are low in calories and fat. The diet also suggests eating small amounts of food frequently. Doing this helps control your blood glucose levels so they do not get too high or too low. Each meal or snack may include a protein food source to help you feel more satisfied and to stabilize your blood glucose. Try to eat about the same amount of food around the same time each day. This includes weekend days, travel days, and days off work. Space your meals about 4 to 5 hours apart and add a snack between them if you wish.  For example, a daily food plan could include breakfast, a morning snack, lunch, dinner, and an evening snack. Healthy meals and snacks include whole grains, vegetables, fruits, lean meats, poultry, fish, and dairy products. As you plan your meals, select a variety of foods. Choose from the bread and starch, vegetable, fruit, dairy, and meat/protein groups. Examples of foods from each group and their suggested serving sizes are listed below. Use measuring cups and spoons to become familiar with what a healthy portion looks like. Bread and Starch Each serving  equals 15 grams of carbohydrates.  1 slice bread.   bagel.   cup cold cereal (unsweetened).   cup hot cereal or mashed potatoes.  1 small potato (size of a computer mouse).   cup cooked pasta or rice.   English muffin.  1 cup broth-based soup.  3 cups of popcorn.  4 to 6 whole-wheat crackers.   cup cooked beans, peas, or corn. Vegetable Each serving equals 5 grams of carbohydrates.   cup cooked vegetables.  1 cup raw vegetables.   cup tomato or vegetable juice. Fruit Each serving equals 15 grams of carbohydrates.  1 small apple or orange.  1 cup watermelon or strawberries.   cup applesauce (no sugar added).  2 tbs raisins.   banana.   cup canned fruit, packed in water, its own juice, or sweetened with a sugar substitute.   cup unsweetened fruit juice. Dairy Each serving equals 12 to 15 grams of carbohydrates.  1 cup fat-free milk.  6 oz artificially sweetened yogurt or plain yogurt.  1 cup low-fat buttermilk.  1 cup soy milk.  1 cup almond milk. Meat/Protein  1 large egg.  2 to 3 oz meat, poultry, or fish.   cup low-fat cottage cheese.  1 tbs peanut butter.  1 oz low-fat cheese.   cup tuna in water.   cup tofu. Fat  1 tsp oil.  1 tsp trans-fat-free margarine.  1 tsp butter.  1 tsp mayonnaise.  2 tbs avocado.  1 tbs salad dressing.  1 tbs cream cheese.    2 tbs sour cream. SAMPLE 1800 CALORIE DIET PLAN Breakfast   cup unsweetened cereal (1 carb serving).  1 cup fat-free milk (1 carb serving).  1 slice whole-wheat toast (1 carb serving).   small banana (1 carb serving).  1 scrambled egg.  1 tsp trans-fat-free margarine. Lunch  Tuna sandwich.  2 slices whole-wheat bread (2 carb servings).   cup canned tuna in water, drained.  1 tbs reduced fat mayonnaise.  1 stalk celery, chopped.  2 slices tomato.  1 lettuce leaf.  1 cup carrot sticks.  24 to 30 seedless grapes (2 carb  servings).  6 oz light yogurt (1 carb serving). Afternoon Snack  3 graham cracker squares (1 carb serving).  Fat-free milk, 1 cup (1 carb serving).  1 tbs peanut butter. Dinner  3 oz salmon, broiled with 1 tsp oil.  1 cup mashed potatoes (2 carb servings) with 1 tsp trans-fat-free margarine.  1 cup fresh or frozen green beans.  1 cup steamed asparagus.  1 cup fat-free milk (1 carb serving). Evening Snack  3 cups air-popped popcorn (1 carb serving).  2 tbs parmesan cheese sprinkled on top. MEAL PLAN Use this worksheet to help you make a daily meal plan based on the 1800 calorie diet suggestions. If you are using this plan to help you control your blood glucose, you may interchange carbohydrate-containing foods (dairy, starches, and fruits). Select a variety of fresh foods of varying colors and flavors. The total amount of carbohydrate in your meals or snacks is more important than making sure you include all of the food groups every time you eat. Choose from the following foods to build your day's meals:  8 Starches.  4 Vegetables.  3 Fruits.  2 Dairy.  6 to 7 oz Meat/Protein.  Up to 4 Fats. Your dietician can use this worksheet to help you decide how many servings and which types of foods are right for you. BREAKFAST Food Group and Servings / Food Choice Starch ________________________________________________________ Dairy _________________________________________________________ Fruit _________________________________________________________ Meat/Protein __________________________________________________ Fat ___________________________________________________________ LUNCH Food Group and Servings / Food Choice Starch ________________________________________________________ Meat/Protein __________________________________________________ Vegetable _____________________________________________________ Fruit  _________________________________________________________ Dairy _________________________________________________________ Fat ___________________________________________________________ AFTERNOON SNACK Food Group and Servings / Food Choice Starch ________________________________________________________ Meat/Protein __________________________________________________ Fruit __________________________________________________________ Dairy _________________________________________________________ DINNER Food Group and Servings / Food Choice Starch _________________________________________________________ Meat/Protein ___________________________________________________ Dairy __________________________________________________________ Vegetable ______________________________________________________ Fruit ___________________________________________________________ Fat ____________________________________________________________ EVENING SNACK Food Group and Servings / Food Choice Fruit __________________________________________________________ Meat/Protein ___________________________________________________ Dairy __________________________________________________________ Starch _________________________________________________________ DAILY TOTALS Starch ____________________________ Vegetable _________________________ Fruit _____________________________ Dairy _____________________________ Meat/Protein______________________ Fat _______________________________ Document Released: 11/13/2004 Document Revised: 07/16/2011 Document Reviewed: 03/09/2011 [employer] Patient Information 2014 [employer].  

## 2012-09-26 ENCOUNTER — Ambulatory Visit: Payer: Self-pay | Admitting: Nurse Practitioner

## 2012-09-26 LAB — NMR LIPOPROFILE WITH LIPIDS
Cholesterol, Total: 177 mg/dL (ref <200)
LDL Particle Number: 1462 nmol/L — ABNORMAL HIGH (ref <1000)
Large HDL-P: 2.4 umol/L — ABNORMAL LOW (ref >=4.8)
Large VLDL-P: 3.7 nmol/L — ABNORMAL HIGH (ref <=2.7)
Small LDL Particle Number: 1189 nmol/L — ABNORMAL HIGH (ref <=527)
Triglycerides: 269 mg/dL — ABNORMAL HIGH (ref <150)

## 2012-10-01 ENCOUNTER — Ambulatory Visit (INDEPENDENT_AMBULATORY_CARE_PROVIDER_SITE_OTHER): Payer: Medicaid Other | Admitting: Cardiology

## 2012-10-01 ENCOUNTER — Encounter: Payer: Self-pay | Admitting: Cardiology

## 2012-10-01 VITALS — BP 124/83 | HR 71 | Ht 70.0 in | Wt 180.0 lb

## 2012-10-01 DIAGNOSIS — E785 Hyperlipidemia, unspecified: Secondary | ICD-10-CM

## 2012-10-01 DIAGNOSIS — I2510 Atherosclerotic heart disease of native coronary artery without angina pectoris: Secondary | ICD-10-CM

## 2012-10-01 DIAGNOSIS — I10 Essential (primary) hypertension: Secondary | ICD-10-CM

## 2012-10-01 NOTE — Patient Instructions (Addendum)

## 2012-10-01 NOTE — Assessment & Plan Note (Signed)
Recent numbers reviewed with plan to increase Zocor, Could alternatively be tried on a different statin if this is ineffective or he does not tolerate it.

## 2012-10-01 NOTE — Assessment & Plan Note (Signed)
Good blood pressure control today. No changes made. 

## 2012-10-01 NOTE — Progress Notes (Signed)
Clinical Summary Edward [name] is a 62 y.o.male last seen in November 2013. He is here with his wife. He reports no significant angina, does walk for exercise. He reports compliance with his medications.  Last ischemic evaluation was with low risk Cardiolite from 2010, element of anterior scar with no ischemia, LVEF 64%. We had discussed possibly pursuing a followup stress test, however at this point elected to follow him symptomatically, since he is on reasonable medical therapy, also has chronic kidney disease with that would increase his risk for contrast nephropathy with revascularization. It would seem reasonable to pursue stress testing in light of progressive symptoms first.  ECG today shows sinus rhythm with poor R wave progression. Recent lab work noted with potassium 5.7, BUN 24, creatinine 2.1, AST 26, ALT 25, hemoglobin A1c 7.7%, LDL 95, HDL 28, triglycerides 269, cholesterol 177. LDL size is low with increased particle number. Zocor dose is being increased.  No Known Allergies  Current Outpatient Prescriptions  Medication Sig Dispense Refill  . aspirin 81 MG tablet Take 81 mg by mouth daily.        . brimonidine (ALPHAGAN) 0.15 % ophthalmic solution Place 1 drop into both eyes 2 (two) times daily.      . Cholecalciferol (VITAMIN D3) 2000 UNITS TABS Take 1 tablet by mouth daily.      . fenofibrate (TRICOR) 145 MG tablet Take 145 mg by mouth daily.      . fish oil-omega-3 fatty acids 1000 MG capsule Take 1 g by mouth 2 (two) times daily.        [name] glimepiride (AMARYL) 4 MG tablet Take 1 tablet (4 mg total) by mouth daily before breakfast.  30 tablet  5  . glipiZIDE (GLUCOTROL) 5 MG tablet Take 1 tablet (5 mg total) by mouth 2 (two) times daily before a meal.  30 tablet  5  . lisinopril (PRINIVIL,ZESTRIL) 20 MG tablet Take 1 tablet (20 mg total) by mouth daily.  30 tablet  6  . metoprolol tartrate (LOPRESSOR) 25 MG tablet Take 1 tablet (25 mg total) by mouth 2 (two) times daily.  60  tablet  5  . nitroGLYCERIN (NITROSTAT) 0.4 MG SL tablet Place 1 tablet (0.4 mg total) under the tongue every 5 (five) minutes as needed.  90 tablet  0  . simvastatin (ZOCOR) 40 MG tablet Take 1 tablet (40 mg total) by mouth at bedtime.  30 tablet  5  . traZODone (DESYREL) 100 MG tablet Take 1 tablet (100 mg total) by mouth at bedtime.  30 tablet  5   No current facility-administered medications for this visit.    Past Medical History  Diagnosis Date  . Coronary atherosclerosis of native coronary artery     BMS LAD 2006; residual 75% distal RCA; EF 50%  . Type 2 diabetes mellitus   . Essential hypertension, benign   . Mixed hyperlipidemia   . Low back pain   . MI (myocardial infarction)     Anterior 2006    Social History Edward [name] reports that he quit smoking about 25 years ago. His smoking use included Cigarettes. He has a 40 pack-year smoking history. He has never used smokeless tobacco. Edward [name] reports that he does not drink alcohol.  Review of Systems No palpitations, dizziness, syncope. No reported bleeding problems. Otherwise negative.  Physical Examination Filed Vitals:   10/01/12 1500  BP: 124/83  Pulse: 71   Filed Weights   10/01/12 1500  Weight: 180 lb (81.647  kg)    Comfortable.  HEENT: Conjunctiva and lids normal, oropharynx clear.  Neck: Supple, no elevated JVP or carotid bruits, no thyromegaly.  Lungs: Clear to auscultation, nonlabored breathing at rest.  Cardiac: Regular rate and rhythm, no S3 or significant systolic murmur, no pericardial rub.  Abdomen: Soft, nontender, no hepatomegaly, bowel sounds present, no guarding or rebound.  Extremities: No pitting edema, distal pulses 2+. Skin: Warm and dry. Musculoskeletal: No kyphosis. Neuropsychiatric: Alert and oriented x3, affect appropriate.   Problem List and Plan   Coronary atherosclerosis of native coronary artery Symptomatically stable. ECG reviewed and also stable. We will continue medical  therapy and observation, can pursue followup stress testing if he manifests any progressive symptoms. Pursuit of invasive testing or revascularization would generally be high risk for contrast nephropathy in light of his chronic kidney disease, recent creatinine 2.1. Office followup arranged.  Essential hypertension, benign Good blood pressure control today. No changes made.  HYPERLIPIDEMIA Recent numbers reviewed with plan to increase Zocor, Could alternatively be tried on a different statin if this is ineffective or he does not tolerate it.    Jonelle Sidle, M.D., F.A.C.C.

## 2012-10-01 NOTE — Assessment & Plan Note (Signed)
Symptomatically stable. ECG reviewed and also stable. We will continue medical therapy and observation, can pursue followup stress testing if he manifests any progressive symptoms. Pursuit of invasive testing or revascularization would generally be high risk for contrast nephropathy in light of his chronic kidney disease, recent creatinine 2.1. Office followup arranged.

## 2012-10-07 ENCOUNTER — Other Ambulatory Visit (INDEPENDENT_AMBULATORY_CARE_PROVIDER_SITE_OTHER): Payer: Medicaid Other

## 2012-10-07 DIAGNOSIS — R5381 Other malaise: Secondary | ICD-10-CM

## 2012-10-07 DIAGNOSIS — R5383 Other fatigue: Secondary | ICD-10-CM

## 2012-10-07 LAB — BASIC METABOLIC PANEL WITH GFR
BUN: 26 mg/dL — ABNORMAL HIGH (ref 6–23)
CO2: 22 mEq/L (ref 19–32)
Calcium: 9.4 mg/dL (ref 8.4–10.5)
GFR, Est African American: 37 mL/min — ABNORMAL LOW
Glucose, Bld: 157 mg/dL — ABNORMAL HIGH (ref 70–99)

## 2012-10-09 ENCOUNTER — Other Ambulatory Visit: Payer: Self-pay | Admitting: Nurse Practitioner

## 2012-10-10 NOTE — Telephone Encounter (Signed)
LAST REFILL 2012. LAST OV 5/14. CALL IN [facility] IF APPROVED.

## 2012-10-15 ENCOUNTER — Telehealth: Payer: Self-pay | Admitting: Nurse Practitioner

## 2012-10-15 DIAGNOSIS — E119 Type 2 diabetes mellitus without complications: Secondary | ICD-10-CM

## 2012-10-15 NOTE — Telephone Encounter (Signed)
Pt says rx needs to be BID. Advise please.

## 2012-10-16 MED ORDER — GLIPIZIDE 5 MG PO TABS: 5.0000 mg | ORAL_TABLET | Freq: Two times a day (BID) | ORAL | Status: DC

## 2012-10-16 NOTE — Telephone Encounter (Signed)
rx for glipizide corrected

## 2012-10-17 ENCOUNTER — Encounter: Payer: Self-pay | Admitting: *Deleted

## 2012-10-17 DIAGNOSIS — E119 Type 2 diabetes mellitus without complications: Secondary | ICD-10-CM

## 2012-10-17 NOTE — Telephone Encounter (Signed)
This encounter was created in error - please disregard.

## 2012-10-23 ENCOUNTER — Encounter: Payer: Self-pay | Admitting: *Deleted

## 2012-11-14 ENCOUNTER — Telehealth: Payer: Self-pay | Admitting: Nurse Practitioner

## 2012-11-17 NOTE — Telephone Encounter (Signed)
This was taken care of last month,

## 2012-12-31 ENCOUNTER — Ambulatory Visit: Payer: Medicaid Other | Admitting: Nurse Practitioner

## 2013-02-11 ENCOUNTER — Other Ambulatory Visit: Payer: Self-pay | Admitting: Nurse Practitioner

## 2013-02-16 ENCOUNTER — Other Ambulatory Visit: Payer: Self-pay

## 2013-02-16 DIAGNOSIS — E785 Hyperlipidemia, unspecified: Secondary | ICD-10-CM

## 2013-02-16 DIAGNOSIS — E119 Type 2 diabetes mellitus without complications: Secondary | ICD-10-CM

## 2013-02-16 MED ORDER — SIMVASTATIN 40 MG PO TABS: 40.0000 mg | ORAL_TABLET | Freq: Every day | ORAL | Status: DC

## 2013-02-16 MED ORDER — GLIMEPIRIDE 4 MG PO TABS: 4.0000 mg | ORAL_TABLET | Freq: Every day | ORAL | Status: DC

## 2013-02-16 NOTE — Telephone Encounter (Signed)
Last seen MMM 09/25/12  Last lipids 09/25/12 last glucose 10/07/12

## 2013-02-16 NOTE — Telephone Encounter (Signed)
rx refilled but NTBS

## 2013-03-11 ENCOUNTER — Encounter: Payer: Self-pay | Admitting: Nurse Practitioner

## 2013-03-11 ENCOUNTER — Encounter (INDEPENDENT_AMBULATORY_CARE_PROVIDER_SITE_OTHER): Payer: Self-pay

## 2013-03-11 ENCOUNTER — Ambulatory Visit (INDEPENDENT_AMBULATORY_CARE_PROVIDER_SITE_OTHER): Payer: Medicaid Other | Admitting: Nurse Practitioner

## 2013-03-11 VITALS — BP 110/71 | HR 61 | Temp 97.4°F | Ht 70.0 in | Wt 180.0 lb

## 2013-03-11 DIAGNOSIS — E119 Type 2 diabetes mellitus without complications: Secondary | ICD-10-CM

## 2013-03-11 DIAGNOSIS — I10 Essential (primary) hypertension: Secondary | ICD-10-CM

## 2013-03-11 DIAGNOSIS — E785 Hyperlipidemia, unspecified: Secondary | ICD-10-CM

## 2013-03-11 DIAGNOSIS — G4700 Insomnia, unspecified: Secondary | ICD-10-CM

## 2013-03-11 LAB — POCT UA - MICROALBUMIN: Microalbumin Ur, POC: NEGATIVE mg/L

## 2013-03-11 LAB — POCT GLYCOSYLATED HEMOGLOBIN (HGB A1C): Hemoglobin A1C: 7.8

## 2013-03-11 MED ORDER — TRAZODONE HCL 100 MG PO TABS: 100.0000 mg | ORAL_TABLET | Freq: Every day | ORAL | Status: DC

## 2013-03-11 MED ORDER — SITAGLIPTIN PHOSPHATE 100 MG PO TABS: 100.0000 mg | ORAL_TABLET | Freq: Every day | ORAL | Status: DC

## 2013-03-11 MED ORDER — GLIMEPIRIDE 4 MG PO TABS: 4.0000 mg | ORAL_TABLET | Freq: Every day | ORAL | Status: DC

## 2013-03-11 MED ORDER — TRICOR 145 MG PO TABS: 145.0000 mg | ORAL_TABLET | Freq: Every day | ORAL | Status: DC

## 2013-03-11 MED ORDER — LISINOPRIL 20 MG PO TABS: 20.0000 mg | ORAL_TABLET | Freq: Every day | ORAL | Status: DC

## 2013-03-11 MED ORDER — GLIPIZIDE 5 MG PO TABS: 5.0000 mg | ORAL_TABLET | Freq: Two times a day (BID) | ORAL | Status: DC

## 2013-03-11 MED ORDER — METOPROLOL TARTRATE 25 MG PO TABS: 25.0000 mg | ORAL_TABLET | Freq: Two times a day (BID) | ORAL | Status: DC

## 2013-03-11 MED ORDER — SIMVASTATIN 40 MG PO TABS: 40.0000 mg | ORAL_TABLET | Freq: Every day | ORAL | Status: DC

## 2013-03-11 NOTE — Patient Instructions (Signed)

## 2013-03-11 NOTE — Progress Notes (Signed)
Subjective:    Patient ID: Edward [name], male    DOB: Sep 01, 1950, 62 y.o.   MRN: [id number]  Diabetes He presents for his follow-up diabetic visit. He has type 2 diabetes mellitus. No MedicAlert identification noted. The initial diagnosis of diabetes was made 8 years ago. His disease course has been stable. There are no hypoglycemic associated symptoms. Pertinent negatives for hypoglycemia include no headaches. Pertinent negatives for diabetes include no blurred vision, no chest pain, no fatigue, no foot paresthesias, no polydipsia, no polyphagia, no visual change and no weight loss. There are no hypoglycemic complications. Symptoms are stable. Diabetic complications include heart disease. Risk factors for coronary artery disease include dyslipidemia, hypertension and male sex. Current diabetic treatment includes oral agent (dual therapy). He is compliant with treatment all of the time. His weight is stable. When asked about meal planning, he reported none. He has not had a previous visit with a dietician. He rarely participates in exercise. There is no change in his home blood glucose trend. His breakfast blood glucose is taken between 7-8 am. His breakfast blood glucose range is generally 110-130 mg/dl. An ACE inhibitor/angiotensin II receptor blocker is being taken. He sees a podiatrist.Eye exam is current (January 2014).  Hypertension This is a chronic problem. The current episode started more than 1 year ago. The problem is unchanged. The problem is controlled. Pertinent negatives include no blurred vision, chest pain, headaches, orthopnea, palpitations, peripheral edema or shortness of breath. There are no associated agents to hypertension. Risk factors for coronary artery disease include male gender, dyslipidemia and diabetes mellitus. Past treatments include beta blockers and ACE inhibitors. The current treatment provides significant improvement. There are no compliance problems.  Hypertensive  end-organ damage includes CAD/MI (2006).  Hyperlipidemia This is a chronic problem. The current episode started more than 1 year ago. The problem is controlled. Recent lipid tests were reviewed and are normal. Exacerbating diseases include diabetes. Pertinent negatives include no chest pain, leg pain, myalgias or shortness of breath. He is currently on no antihyperlipidemic treatment. There are no compliance problems.  Risk factors for coronary artery disease include diabetes mellitus, hypertension and male sex.   * Patient is in an Improve it Study- Lipid management study- Patient was either oin Vytorin or Zocor 40- He is currently on nothing from study- Cherrie Distance from the study was in to draw labs from patient and requested that patient be put on Zocor 40mg  and repeat labs in 3 months,. They will pull information needed off of epic as labs are completed.- This is the end of study.  Review of Systems  Constitutional: Negative for weight loss and fatigue.  Eyes: Negative for blurred vision.  Respiratory: Negative for shortness of breath.   Cardiovascular: Negative for chest pain, palpitations and orthopnea.  Endocrine: Negative for polydipsia and polyphagia.  Musculoskeletal: Negative for myalgias.  Neurological: Negative for headaches.  All other systems reviewed and are negative.       Objective:   Physical Exam  Constitutional: He is oriented to person, place, and time. He appears well-developed and well-nourished.  HENT:  Head: Normocephalic.  Right Ear: External ear normal.  Left Ear: External ear normal.  Nose: Nose normal.  Mouth/Throat: Oropharynx is clear and moist.  Eyes: EOM are normal. Pupils are equal, round, and reactive to light.  Neck: Normal range of motion. Neck supple. No thyromegaly present.  Cardiovascular: Normal rate, regular rhythm, normal heart sounds and intact distal pulses.   No murmur heard. Pulmonary/Chest: Effort  normal and breath sounds normal. He has  no wheezes. He has no rales.  Abdominal: Soft. Bowel sounds are normal.  Musculoskeletal: Normal range of motion.  Neurological: He is alert and oriented to person, place, and time.  Skin: Skin is warm and dry.  Psychiatric: He has a normal mood and affect. His behavior is normal. Judgment and thought content normal.   See diabetic foot exam. BP 110/71  Pulse 61  Temp(Src) 97.4 F (36.3 C) (Oral)  Ht 5\' 10"  (1.778 m)  Wt 180 lb (81.647 kg)  BMI 25.83 kg/m2 Results for orders placed in visit on 03/11/13  POCT GLYCOSYLATED HEMOGLOBIN (HGB A1C)      Result Value Range   Hemoglobin A1C 7.8%           Assessment & Plan:   1. Type II or unspecified type diabetes mellitus without mention of complication, not stated as uncontrolled   2. Hyperlipidemia   3. Hypertension   4. Insomnia    Orders Placed This Encounter  Procedures  . NMR, lipoprofile  . CMP14+EGFR  . POCT glycosylated hemoglobin (Hb A1C)  . POCT UA - Microalbumin   Meds ordered this encounter  Medications  . TRICOR 145 MG tablet    Sig: Take 1 tablet (145 mg total) by mouth daily.    Dispense:  30 tablet    Refill:  5    Order Specific Question:  Supervising Provider    Answer:  Ernestina Penna [[id number]]  . glipiZIDE (GLUCOTROL) 5 MG tablet    Sig: Take 1 tablet (5 mg total) by mouth 2 (two) times daily before a meal.    Dispense:  60 tablet    Refill:  5    Order Specific Question:  Supervising Provider    Answer:  Ernestina Penna [[id number]]  . simvastatin (ZOCOR) 40 MG tablet    Sig: Take 1 tablet (40 mg total) by mouth at bedtime.    Dispense:  30 tablet    Refill:  5    Order Specific Question:  Supervising Provider    Answer:  Ernestina Penna [[id number]]  . glimepiride (AMARYL) 4 MG tablet    Sig: Take 1 tablet (4 mg total) by mouth daily before breakfast.    Dispense:  30 tablet    Refill:  5    Order Specific Question:  Supervising Provider    Answer:  Ernestina Penna [[id number]]  . metoprolol tartrate  (LOPRESSOR) 25 MG tablet    Sig: Take 1 tablet (25 mg total) by mouth 2 (two) times daily.    Dispense:  60 tablet    Refill:  5    Order Specific Question:  Supervising Provider    Answer:  Ernestina Penna [[id number]]  . lisinopril (PRINIVIL,ZESTRIL) 20 MG tablet    Sig: Take 1 tablet (20 mg total) by mouth daily.    Dispense:  30 tablet    Refill:  6    Order Specific Question:  Supervising Provider    Answer:  Ernestina Penna [[id number]]  . traZODone (DESYREL) 100 MG tablet    Sig: Take 1 tablet (100 mg total) by mouth at bedtime.    Dispense:  30 tablet    Refill:  5    Order Specific Question:  Supervising Provider    Answer:  Ernestina Penna [[id number]]   Added Januvia 100 daily- to meds Continue all meds Labs pending Diet and exercise encouraged Health maintenance reviewed Follow up in 3  months  Edward Melroy Bougher, FNP   

## 2013-03-12 ENCOUNTER — Other Ambulatory Visit (INDEPENDENT_AMBULATORY_CARE_PROVIDER_SITE_OTHER): Payer: Medicaid Other

## 2013-03-12 DIAGNOSIS — E785 Hyperlipidemia, unspecified: Secondary | ICD-10-CM

## 2013-03-12 DIAGNOSIS — E119 Type 2 diabetes mellitus without complications: Secondary | ICD-10-CM

## 2013-03-12 DIAGNOSIS — R7989 Other specified abnormal findings of blood chemistry: Secondary | ICD-10-CM

## 2013-03-12 NOTE — Addendum Note (Signed)
Addended by: Orma Render F on: 03/12/2013 05:26 PM   Modules accepted: Orders

## 2013-03-12 NOTE — Progress Notes (Signed)
Pt came in for redraw of k+

## 2013-03-13 LAB — CMP14+EGFR
ALT: 32 IU/L (ref 0–44)
Albumin: 4.8 g/dL (ref 3.6–4.8)
Alkaline Phosphatase: 36 IU/L — ABNORMAL LOW (ref 39–117)
BUN: 30 mg/dL — ABNORMAL HIGH (ref 8–27)
CO2: 22 mmol/L (ref 18–29)
Chloride: 97 mmol/L (ref 97–108)
Glucose: 194 mg/dL — ABNORMAL HIGH (ref 65–99)
Potassium: 6.2 mmol/L (ref 3.5–5.2)
Total Protein: 7.3 g/dL (ref 6.0–8.5)

## 2013-03-13 LAB — NMR, LIPOPROFILE
LDL Size: 19.5 nm — ABNORMAL LOW (ref >20.5)
Small LDL Particle Number: 1875 nmol/L — ABNORMAL HIGH (ref <=527)

## 2013-03-13 LAB — POTASSIUM: Potassium: 5.3 mmol/L — ABNORMAL HIGH (ref 3.5–5.2)

## 2013-03-30 ENCOUNTER — Telehealth: Payer: Self-pay | Admitting: Nurse Practitioner

## 2013-03-31 ENCOUNTER — Telehealth: Payer: Self-pay | Admitting: Nurse Practitioner

## 2013-04-01 NOTE — Telephone Encounter (Signed)
Patient aware.

## 2013-04-07 NOTE — Telephone Encounter (Signed)
Patient aware samples up front  

## 2013-05-04 ENCOUNTER — Telehealth: Payer: Self-pay | Admitting: Nurse Practitioner

## 2013-05-04 NOTE — Telephone Encounter (Signed)
Samples up front patient aware  

## 2013-05-12 ENCOUNTER — Ambulatory Visit: Payer: Medicaid Other | Admitting: Cardiology

## 2013-06-02 ENCOUNTER — Telehealth: Payer: Self-pay | Admitting: Nurse Practitioner

## 2013-06-02 NOTE — Telephone Encounter (Signed)
Pt notified no samples available at this time. 

## 2013-06-08 MED ORDER — SITAGLIPTIN PHOSPHATE 100 MG PO TABS: 100.0000 mg | ORAL_TABLET | Freq: Every day | ORAL | Status: DC

## 2013-06-08 NOTE — Telephone Encounter (Signed)
Samples left up front. Patient aware  

## 2013-06-15 ENCOUNTER — Ambulatory Visit (INDEPENDENT_AMBULATORY_CARE_PROVIDER_SITE_OTHER): Payer: Medicaid Other | Admitting: Nurse Practitioner

## 2013-06-15 ENCOUNTER — Encounter: Payer: Self-pay | Admitting: Nurse Practitioner

## 2013-06-15 VITALS — BP 112/68 | HR 49 | Temp 97.0°F | Ht 70.0 in | Wt 182.0 lb

## 2013-06-15 DIAGNOSIS — E119 Type 2 diabetes mellitus without complications: Secondary | ICD-10-CM

## 2013-06-15 DIAGNOSIS — I10 Essential (primary) hypertension: Secondary | ICD-10-CM

## 2013-06-15 DIAGNOSIS — E785 Hyperlipidemia, unspecified: Secondary | ICD-10-CM

## 2013-06-15 DIAGNOSIS — G4700 Insomnia, unspecified: Secondary | ICD-10-CM

## 2013-06-15 LAB — POCT GLYCOSYLATED HEMOGLOBIN (HGB A1C)

## 2013-06-15 MED ORDER — SIMVASTATIN 40 MG PO TABS: 40.0000 mg | ORAL_TABLET | Freq: Every day | ORAL | Status: DC

## 2013-06-15 MED ORDER — GLIPIZIDE 5 MG PO TABS: 5.0000 mg | ORAL_TABLET | Freq: Two times a day (BID) | ORAL | Status: DC

## 2013-06-15 MED ORDER — SITAGLIPTIN PHOSPHATE 100 MG PO TABS: 100.0000 mg | ORAL_TABLET | Freq: Every day | ORAL | Status: DC

## 2013-06-15 MED ORDER — TRAZODONE HCL 100 MG PO TABS: 100.0000 mg | ORAL_TABLET | Freq: Every day | ORAL | Status: DC

## 2013-06-15 MED ORDER — METOPROLOL TARTRATE 25 MG PO TABS: 25.0000 mg | ORAL_TABLET | Freq: Two times a day (BID) | ORAL | Status: DC

## 2013-06-15 MED ORDER — LISINOPRIL 20 MG PO TABS: 20.0000 mg | ORAL_TABLET | Freq: Every day | ORAL | Status: DC

## 2013-06-15 MED ORDER — TRICOR 145 MG PO TABS: 145.0000 mg | ORAL_TABLET | Freq: Every day | ORAL | Status: DC

## 2013-06-15 MED ORDER — GLIMEPIRIDE 4 MG PO TABS: 4.0000 mg | ORAL_TABLET | Freq: Every day | ORAL | Status: DC

## 2013-06-15 NOTE — Patient Instructions (Signed)

## 2013-06-15 NOTE — Progress Notes (Signed)
Subjective:    Patient ID: Edward [name], male    DOB: [birth year], 63 y.o.   MRN: [id number]  Patient here today for follow up- doing ok-  Diabetes He presents for his follow-up diabetic visit. He has type 2 diabetes mellitus. No MedicAlert identification noted. The initial diagnosis of diabetes was made 8 years ago. His disease course has been stable. There are no hypoglycemic associated symptoms. Pertinent negatives for hypoglycemia include no headaches. Pertinent negatives for diabetes include no blurred vision, no chest pain, no fatigue, no foot paresthesias, no polydipsia, no polyphagia, no visual change and no weight loss. There are no hypoglycemic complications. Symptoms are stable. Diabetic complications include heart disease. Risk factors for coronary artery disease include dyslipidemia, hypertension and male sex. Current diabetic treatment includes oral agent (triple therapy) (added januvia at last visit.). He is compliant with treatment all of the time. His weight is stable. When asked about meal planning, he reported none. He has not had a previous visit with a dietician. He rarely participates in exercise. There is no change in his home blood glucose trend. His breakfast blood glucose is taken between 7-8 am. His breakfast blood glucose range is generally 110-130 mg/dl. (He really sees no change in blood sugars since adding [place].) An ACE inhibitor/angiotensin II receptor blocker is being taken. He sees a podiatrist.Eye exam is current (January 2014).  Hypertension This is a chronic problem. The current episode started more than 1 year ago. The problem is unchanged. The problem is controlled. Pertinent negatives include no blurred vision, chest pain, headaches, orthopnea, palpitations, peripheral edema or shortness of breath. There are no associated agents to hypertension. Risk factors for coronary artery disease include male gender, dyslipidemia and diabetes mellitus. Past treatments include  beta blockers and ACE inhibitors. The current treatment provides significant improvement. There are no compliance problems.  Hypertensive end-organ damage includes CAD/MI (2006).  Hyperlipidemia This is a chronic problem. The current episode started more than 1 year ago. The problem is controlled. Recent lipid tests were reviewed and are normal. Exacerbating diseases include diabetes. Pertinent negatives include no chest pain, leg pain, myalgias or shortness of breath. He is currently on no antihyperlipidemic treatment. There are no compliance problems.  Risk factors for coronary artery disease include diabetes mellitus, hypertension and male sex.   * Patient is in an Improve it Study- Lipid management study- Patient was either oin Vytorin or Zocor 40- He is currently on nothing from study- Jake Bathe from the study was in to draw labs from patient and requested that patient be put on Zocor 52m and repeat labs in 3 months,. They will pull information needed off of epic as labs are completed.- This is the end of study.  Review of Systems  Constitutional: Negative for weight loss and fatigue.  Eyes: Negative for blurred vision.  Respiratory: Negative for shortness of breath.   Cardiovascular: Negative for chest pain, palpitations and orthopnea.  Endocrine: Negative for polydipsia and polyphagia.  Musculoskeletal: Negative for myalgias.  Neurological: Negative for headaches.  All other systems reviewed and are negative.       Objective:   Physical Exam  Constitutional: He is oriented to person, place, and time. He appears well-developed and well-nourished.  HENT:  Head: Normocephalic.  Right Ear: External ear normal.  Left Ear: External ear normal.  Nose: Nose normal.  Mouth/Throat: Oropharynx is clear and moist.  Eyes: EOM are normal. Pupils are equal, round, and reactive to light.  Neck: Normal range of motion.  Neck supple. No thyromegaly present.  Cardiovascular: Normal rate, regular  rhythm, normal heart sounds and intact distal pulses.   No murmur heard. Pulmonary/Chest: Effort normal and breath sounds normal. He has no wheezes. He has no rales.  Abdominal: Soft. Bowel sounds are normal.  Musculoskeletal: Normal range of motion.  Neurological: He is alert and oriented to person, place, and time.  Skin: Skin is warm and dry.  Psychiatric: He has a normal mood and affect. His behavior is normal. Judgment and thought content normal.   See diabetic foot exam. BP 112/68  Pulse 49  Temp(Src) 97 F (36.1 C) (Oral)  Ht 5' 10"  (1.778 m)  Wt 182 lb (82.555 kg)  BMI 26.11 kg/m2 Results for orders placed in visit on 06/15/13  POCT GLYCOSYLATED HEMOGLOBIN (HGB A1C)      Result Value Range   Hemoglobin A1C 7.3%       Assessment & Plan:   1. HYPERLIPIDEMIA   2. Essential hypertension, benign   3. Type II or unspecified type diabetes mellitus without mention of complication, not stated as uncontrolled    Orders Placed This Encounter  Procedures  . CMP14+EGFR  . NMR, lipoprofile  . POCT glycosylated hemoglobin (Hb A1C)   Meds ordered this encounter  Medications  . glimepiride (AMARYL) 4 MG tablet    Sig: Take 1 tablet (4 mg total) by mouth daily before breakfast.    Dispense:  30 tablet    Refill:  5    Order Specific Question:  Supervising Provider    Answer:  Chipper Herb [[id number]]  . glipiZIDE (GLUCOTROL) 5 MG tablet    Sig: Take 1 tablet (5 mg total) by mouth 2 (two) times daily before a meal.    Dispense:  60 tablet    Refill:  5    Order Specific Question:  Supervising Provider    Answer:  Chipper Herb [[id number]]  . lisinopril (PRINIVIL,ZESTRIL) 20 MG tablet    Sig: Take 1 tablet (20 mg total) by mouth daily.    Dispense:  30 tablet    Refill:  6    Order Specific Question:  Supervising Provider    Answer:  Chipper Herb [[id number]]  . metoprolol tartrate (LOPRESSOR) 25 MG tablet    Sig: Take 1 tablet (25 mg total) by mouth 2 (two) times daily.     Dispense:  60 tablet    Refill:  5    Order Specific Question:  Supervising Provider    Answer:  Chipper Herb [[id number]]  . simvastatin (ZOCOR) 40 MG tablet    Sig: Take 1 tablet (40 mg total) by mouth at bedtime.    Dispense:  30 tablet    Refill:  5    Order Specific Question:  Supervising Provider    Answer:  Chipper Herb [[id number]]  . sitaGLIPtin (JANUVIA) 100 MG tablet    Sig: Take 1 tablet (100 mg total) by mouth daily.    Dispense:  28 tablet    Refill:  0    Order Specific Question:  Supervising Provider    Answer:  Chipper Herb [[id number]]  . traZODone (DESYREL) 100 MG tablet    Sig: Take 1 tablet (100 mg total) by mouth at bedtime.    Dispense:  30 tablet    Refill:  5    Order Specific Question:  Supervising Provider    Answer:  Chipper Herb [[id number]]  . TRICOR 145 MG tablet    Sig:  Take 1 tablet (145 mg total) by mouth daily.    Dispense:  30 tablet    Refill:  5    Order Specific Question:  Supervising Provider    Answer:  Chipper Herb [[id number]]     Labs pending Health maintenance reviewed Diet and exercise encouraged-stricter low carb diet Continue all meds Follow up  In 3 months   [place], FNP

## 2013-06-16 LAB — CMP14+EGFR
ALT: 24 IU/L (ref 0–44)
AST: 26 IU/L (ref 0–40)
Albumin/Globulin Ratio: 1.9 (ref 1.1–2.5)
Albumin: 4.6 g/dL (ref 3.6–4.8)
Alkaline Phosphatase: 36 IU/L — ABNORMAL LOW (ref 39–117)
BUN/Creatinine Ratio: 13 (ref 10–22)
BUN: 30 mg/dL — ABNORMAL HIGH (ref 8–27)
CALCIUM: 9.8 mg/dL (ref 8.6–10.2)
CO2: 21 mmol/L (ref 18–29)
CREATININE: 2.23 mg/dL — AB (ref 0.76–1.27)
Chloride: 100 mmol/L (ref 97–108)
GFR calc Af Amer: 36 mL/min/{1.73_m2} — ABNORMAL LOW (ref >59)
GFR calc non Af Amer: 31 mL/min/{1.73_m2} — ABNORMAL LOW (ref >59)
GLOBULIN, TOTAL: 2.4 g/dL (ref 1.5–4.5)
GLUCOSE: 170 mg/dL — AB (ref 65–99)
Potassium: 6 mmol/L (ref 3.5–5.2)
Sodium: 135 mmol/L (ref 134–144)
TOTAL PROTEIN: 7 g/dL (ref 6.0–8.5)
Total Bilirubin: 0.5 mg/dL (ref 0.0–1.2)

## 2013-06-16 LAB — NMR, LIPOPROFILE
Cholesterol: 176 mg/dL (ref <200)
HDL CHOLESTEROL BY NMR: 27 mg/dL — AB (ref >=40)
HDL PARTICLE NUMBER: 23.5 umol/L — AB (ref >=30.5)
LDL Particle Number: 1699 nmol/L — ABNORMAL HIGH (ref <1000)
LDL Size: 20 nm — ABNORMAL LOW (ref >20.5)
LDLC SERPL CALC-MCNC: 88 mg/dL (ref <100)
LP-IR Score: 69 — ABNORMAL HIGH (ref <=45)
SMALL LDL PARTICLE NUMBER: 1197 nmol/L — AB (ref <=527)
TRIGLYCERIDES BY NMR: 305 mg/dL — AB (ref <150)

## 2013-06-22 ENCOUNTER — Telehealth: Payer: Self-pay | Admitting: Nurse Practitioner

## 2013-06-26 NOTE — Telephone Encounter (Signed)
Patient never called back.

## 2013-06-29 ENCOUNTER — Telehealth: Payer: Self-pay | Admitting: *Deleted

## 2013-06-29 NOTE — Telephone Encounter (Signed)
i think has tried lopid in the past

## 2013-06-29 NOTE — Telephone Encounter (Signed)
[place] will not cove tricor until  pt has tried generic lopid, will this work for this pt and if not give me some reasons to argue with.  Thanks

## 2013-07-01 ENCOUNTER — Other Ambulatory Visit (INDEPENDENT_AMBULATORY_CARE_PROVIDER_SITE_OTHER): Payer: Medicaid Other

## 2013-07-01 ENCOUNTER — Telehealth: Payer: Self-pay | Admitting: Nurse Practitioner

## 2013-07-01 DIAGNOSIS — E875 Hyperkalemia: Secondary | ICD-10-CM

## 2013-07-01 NOTE — Telephone Encounter (Signed)
Ok for samples?

## 2013-07-01 NOTE — Telephone Encounter (Signed)
Patient aware of labs and agreed to trying lipitor or crestor please see labs. And patient coming today for cmp

## 2013-07-01 NOTE — Telephone Encounter (Signed)
Samples up front 

## 2013-07-02 LAB — CMP14+EGFR
ALK PHOS: 37 IU/L — AB (ref 39–117)
ALT: 34 IU/L (ref 0–44)
AST: 36 IU/L (ref 0–40)
Albumin/Globulin Ratio: 1.9 (ref 1.1–2.5)
Albumin: 4.6 g/dL (ref 3.6–4.8)
BILIRUBIN TOTAL: 0.5 mg/dL (ref 0.0–1.2)
BUN/Creatinine Ratio: 11 (ref 10–22)
BUN: 22 mg/dL (ref 8–27)
CO2: 20 mmol/L (ref 18–29)
Calcium: 9.7 mg/dL (ref 8.6–10.2)
Chloride: 98 mmol/L (ref 97–108)
Creatinine, Ser: 2.08 mg/dL — ABNORMAL HIGH (ref 0.76–1.27)
GFR calc non Af Amer: 34 mL/min/{1.73_m2} — ABNORMAL LOW (ref >59)
GFR, EST AFRICAN AMERICAN: 39 mL/min/{1.73_m2} — AB (ref >59)
Globulin, Total: 2.4 g/dL (ref 1.5–4.5)
Glucose: 186 mg/dL — ABNORMAL HIGH (ref 65–99)
POTASSIUM: 5.4 mmol/L — AB (ref 3.5–5.2)
SODIUM: 136 mmol/L (ref 134–144)
Total Protein: 7 g/dL (ref 6.0–8.5)

## 2013-08-17 ENCOUNTER — Telehealth: Payer: Self-pay | Admitting: Nurse Practitioner

## 2013-08-18 MED ORDER — SITAGLIPTIN PHOSPHATE 100 MG PO TABS: 100.0000 mg | ORAL_TABLET | Freq: Every day | ORAL | Status: DC

## 2013-08-18 NOTE — Telephone Encounter (Signed)
Left message that samples are available to be picked up.

## 2013-08-26 NOTE — Telephone Encounter (Signed)
Resubmitted and covered

## 2013-09-14 ENCOUNTER — Encounter: Payer: Self-pay | Admitting: Nurse Practitioner

## 2013-09-14 ENCOUNTER — Ambulatory Visit (INDEPENDENT_AMBULATORY_CARE_PROVIDER_SITE_OTHER): Payer: Medicaid Other

## 2013-09-14 ENCOUNTER — Ambulatory Visit (INDEPENDENT_AMBULATORY_CARE_PROVIDER_SITE_OTHER): Payer: Medicaid Other | Admitting: Nurse Practitioner

## 2013-09-14 VITALS — BP 91/56 | HR 49 | Temp 98.0°F | Ht 70.0 in | Wt 183.0 lb

## 2013-09-14 DIAGNOSIS — E119 Type 2 diabetes mellitus without complications: Secondary | ICD-10-CM

## 2013-09-14 DIAGNOSIS — I2510 Atherosclerotic heart disease of native coronary artery without angina pectoris: Secondary | ICD-10-CM

## 2013-09-14 DIAGNOSIS — I10 Essential (primary) hypertension: Secondary | ICD-10-CM

## 2013-09-14 DIAGNOSIS — Z87891 Personal history of nicotine dependence: Secondary | ICD-10-CM

## 2013-09-14 DIAGNOSIS — E785 Hyperlipidemia, unspecified: Secondary | ICD-10-CM

## 2013-09-14 LAB — POCT GLYCOSYLATED HEMOGLOBIN (HGB A1C): Hemoglobin A1C: 7.5

## 2013-09-14 MED ORDER — SITAGLIPTIN PHOSPHATE 100 MG PO TABS: 100.0000 mg | ORAL_TABLET | Freq: Every day | ORAL | Status: DC

## 2013-09-14 MED ORDER — GLIPIZIDE 5 MG PO TABS: ORAL_TABLET | ORAL | Status: DC

## 2013-09-14 NOTE — Patient Instructions (Signed)

## 2013-09-14 NOTE — Progress Notes (Addendum)
Subjective:    Patient ID: Edward [name], male    DOB: 1951-02-04, 63 y.o.   MRN: [id number]  Patient here today for follow up of chronic medical problems. No changes since last visit- no complaints today. Patient was enrolled in the improve study for vytorin- Study is now complete.   Diabetes He presents for his follow-up diabetic visit. He has type 2 diabetes mellitus. No MedicAlert identification noted. The initial diagnosis of diabetes was made 8 years ago. His disease course has been stable. There are no hypoglycemic associated symptoms. Pertinent negatives for hypoglycemia include no headaches. Pertinent negatives for diabetes include no blurred vision, no chest pain, no fatigue, no foot paresthesias, no polydipsia, no polyphagia, no visual change and no weight loss. There are no hypoglycemic complications. Symptoms are stable. Diabetic complications include heart disease. Risk factors for coronary artery disease include dyslipidemia, hypertension and male sex. Current diabetic treatment includes oral agent (triple therapy) (added januvia at last visit.). He is compliant with treatment all of the time. His weight is stable. When asked about meal planning, he reported none. He has not had a previous visit with a dietician. He rarely participates in exercise. There is no change in his home blood glucose trend. His breakfast blood glucose is taken between 7-8 am. His breakfast blood glucose range is generally 110-130 mg/dl. (He really sees no change in blood sugars since adding [place].) An ACE inhibitor/angiotensin II receptor blocker is being taken. He sees a podiatrist.Eye exam is current (January 2014).  Hypertension This is a chronic problem. The current episode started more than 1 year ago. The problem is unchanged. The problem is controlled. Pertinent negatives include no blurred vision, chest pain, headaches, orthopnea, palpitations, peripheral edema or shortness of breath. There are no  associated agents to hypertension. Risk factors for coronary artery disease include male gender, dyslipidemia and diabetes mellitus. Past treatments include beta blockers and ACE inhibitors. The current treatment provides significant improvement. There are no compliance problems.  Hypertensive end-organ damage includes CAD/MI (2006).  Hyperlipidemia This is a chronic problem. The current episode started more than 1 year ago. The problem is controlled. Recent lipid tests were reviewed and are normal. Exacerbating diseases include diabetes. Pertinent negatives include no chest pain, leg pain, myalgias or shortness of breath. He is currently on no antihyperlipidemic treatment. There are no compliance problems.  Risk factors for coronary artery disease include diabetes mellitus, hypertension and male sex.    Review of Systems  Constitutional: Negative for weight loss and fatigue.  Eyes: Negative for blurred vision.  Respiratory: Negative for shortness of breath.   Cardiovascular: Negative for chest pain, palpitations and orthopnea.  Endocrine: Negative for polydipsia and polyphagia.  Musculoskeletal: Negative for myalgias.  Neurological: Negative for headaches.  All other systems reviewed and are negative.      Objective:   Physical Exam  Constitutional: He is oriented to person, place, and time. He appears well-developed and well-nourished.  HENT:  Head: Normocephalic.  Right Ear: External ear normal.  Left Ear: External ear normal.  Nose: Nose normal.  Mouth/Throat: Oropharynx is clear and moist.  Eyes: EOM are normal. Pupils are equal, round, and reactive to light.  Neck: Normal range of motion. Neck supple. No thyromegaly present.  Cardiovascular: Normal rate, regular rhythm, normal heart sounds and intact distal pulses.   No murmur heard. Pulmonary/Chest: Effort normal and breath sounds normal. He has no wheezes. He has no rales.  Abdominal: Soft. Bowel sounds are normal.   Musculoskeletal:  Normal range of motion.  Neurological: He is alert and oriented to person, place, and time.  Skin: Skin is warm and dry.  Psychiatric: He has a normal mood and affect. His behavior is normal. Judgment and thought content normal.   See diabetic foot exam. BP 91/56  Pulse 49  Temp(Src) 98 F (36.7 C) (Oral)  Ht 5' 10"  (1.778 m)  Wt 183 lb (83.008 kg)  BMI 26.26 kg/m2 Results for orders placed in visit on 09/14/13  POCT GLYCOSYLATED HEMOGLOBIN (HGB A1C)      Result Value Ref Range   Hemoglobin A1C 7.5     EKG- sinus bradycardia- Chest xray- normal-Preliminary reading by Ronnald Collum, FNP  [facility]   Assessment & Plan:   1. Type II or unspecified type diabetes mellitus without mention of complication, not stated as uncontrolled   2. HYPERLIPIDEMIA   3. Essential hypertension, benign   4. Coronary atherosclerosis of native coronary artery   5. Hx of smoking    Orders Placed This Encounter  Procedures  . DG Chest 2 View    Standing Status: Future     Number of Occurrences:      Standing Expiration Date: 11/14/2014    Order Specific Question:  Reason for Exam (SYMPTOM  OR DIAGNOSIS REQUIRED)    Answer:  smoker hx    Order Specific Question:  Preferred imaging location?    Answer:  Internal  . CMP14+EGFR  . NMR, lipoprofile  . POCT glycosylated hemoglobin (Hb A1C)  . EKG 12-Lead   .  Meds ordered this encounter  Medications  . glipiZIDE (GLUCOTROL) 5 MG tablet    Sig: 2 in AM and one at night    Dispense:  90 tablet    Refill:  5    Order Specific Question:  Supervising Provider    Answer:  Chipper Herb [[id number]]  . sitaGLIPtin (JANUVIA) 100 MG tablet    Sig: Take 1 tablet (100 mg total) by mouth daily.    Dispense:  35 tablet    Refill:  5    Order Specific Question:  Supervising Provider    Answer:  Chipper Herb [[id number]]  needs to follow up with cardiologist for bradycardia Increased glipizide from BID to 2 Qam and 1 qhs- keep diary of blood  sugars Labs pending Health maintenance reviewed Diet and exercise encouraged Continue all meds Follow up  In 3 months   Catharine, FNP

## 2013-09-15 ENCOUNTER — Telehealth: Payer: Self-pay | Admitting: Family Medicine

## 2013-09-15 LAB — CMP14+EGFR
ALT: 23 IU/L (ref 0–44)
AST: 31 IU/L (ref 0–40)
Albumin/Globulin Ratio: 2 (ref 1.1–2.5)
Albumin: 4.5 g/dL (ref 3.6–4.8)
Alkaline Phosphatase: 31 IU/L — ABNORMAL LOW (ref 39–117)
BILIRUBIN TOTAL: 0.5 mg/dL (ref 0.0–1.2)
BUN/Creatinine Ratio: 12 (ref 10–22)
BUN: 29 mg/dL — ABNORMAL HIGH (ref 8–27)
CO2: 23 mmol/L (ref 18–29)
Calcium: 9.5 mg/dL (ref 8.6–10.2)
Chloride: 99 mmol/L (ref 97–108)
Creatinine, Ser: 2.42 mg/dL — ABNORMAL HIGH (ref 0.76–1.27)
GFR calc non Af Amer: 28 mL/min/{1.73_m2} — ABNORMAL LOW (ref >59)
GFR, EST AFRICAN AMERICAN: 32 mL/min/{1.73_m2} — AB (ref >59)
GLUCOSE: 150 mg/dL — AB (ref 65–99)
Globulin, Total: 2.3 g/dL (ref 1.5–4.5)
POTASSIUM: 5.6 mmol/L — AB (ref 3.5–5.2)
Sodium: 134 mmol/L (ref 134–144)
TOTAL PROTEIN: 6.8 g/dL (ref 6.0–8.5)

## 2013-09-15 LAB — NMR, LIPOPROFILE
Cholesterol: 147 mg/dL (ref 100–199)
HDL Cholesterol by NMR: 21 mg/dL — ABNORMAL LOW (ref >39)
HDL Particle Number: 24.3 umol/L — ABNORMAL LOW (ref >=30.5)
LDL PARTICLE NUMBER: 1562 nmol/L — AB (ref <1000)
LDL SIZE: 19.6 nm (ref >20.5)
LDLC SERPL CALC-MCNC: 82 mg/dL (ref 0–99)
LP-IR SCORE: 76 — AB (ref <=45)
Small LDL Particle Number: 1332 nmol/L — ABNORMAL HIGH (ref <=527)
Triglycerides by NMR: 221 mg/dL — ABNORMAL HIGH (ref 0–149)

## 2013-09-15 NOTE — Telephone Encounter (Signed)
Message copied by Waverly Ferrari on Tue Sep 15, 2013 11:40 AM ------      Message from: [name]      Created: Tue Sep 15, 2013 10:23 AM       Hgba1c discussed at appointment- increased glipizide      Kidney and liver function stable- does he see nephrologist?      K+ elevated- avoid K+ in diet      LDL are a little better but still to high- will try something other than zocor?                   ------

## 2013-09-16 ENCOUNTER — Encounter: Payer: Self-pay | Admitting: Cardiology

## 2013-09-16 ENCOUNTER — Ambulatory Visit (INDEPENDENT_AMBULATORY_CARE_PROVIDER_SITE_OTHER): Payer: Medicaid Other | Admitting: Cardiology

## 2013-09-16 VITALS — BP 100/65 | HR 55 | Ht 70.0 in | Wt 182.1 lb

## 2013-09-16 DIAGNOSIS — E785 Hyperlipidemia, unspecified: Secondary | ICD-10-CM | POA: Diagnosis not present

## 2013-09-16 DIAGNOSIS — I2510 Atherosclerotic heart disease of native coronary artery without angina pectoris: Secondary | ICD-10-CM

## 2013-09-16 DIAGNOSIS — I10 Essential (primary) hypertension: Secondary | ICD-10-CM | POA: Diagnosis not present

## 2013-09-16 NOTE — Patient Instructions (Signed)
Continue all current medications. Your physician wants you to follow up in: 6 months.  You will receive a reminder letter in the mail one-two months in advance.  If you don't receive a letter, please call our office to schedule the follow up appointment   

## 2013-09-16 NOTE — Progress Notes (Signed)
Clinical Summary Edward [name] is a 63 y.o.male last seen in May 2014. He is here with his wife. Doing reasonably well from a cardiac perspective, no obvious angina, no nitroglycerin use. He describes an occasional "twinge" in his chest sometimes and the right side of his neck, nonexertional.  Recent lab work in May shows hemoglobin A1c 7.5, BUN 29, creatinine 2.4, potassium 5.6, cholesterol 147, triglycerides 221, HDL 21, LDL 82. ECG done just recently showed sinus bradycardia with poor R wave progression versus old anterior infarct pattern (stable).  Last ischemic evaluation was with low risk Cardiolite from 2010, element of anterior scar with no ischemia, LVEF 64%. We continue medical therapy and observation.    No Known Allergies  Current Outpatient Prescriptions  Medication Sig Dispense Refill  . aspirin 81 MG tablet Take 81 mg by mouth daily.        . brimonidine (ALPHAGAN) 0.15 % ophthalmic solution Place 1 drop into both eyes 2 (two) times daily.      . Cholecalciferol (VITAMIN D3) 2000 UNITS TABS Take 1 tablet by mouth daily.      . fish oil-omega-3 fatty acids 1000 MG capsule Take 1 g by mouth 2 (two) times daily.        [name] glimepiride (AMARYL) 4 MG tablet Take 1 tablet (4 mg total) by mouth daily before breakfast.  30 tablet  5  . glipiZIDE (GLUCOTROL) 5 MG tablet 2 in AM and one at night  90 tablet  5  . lisinopril (PRINIVIL,ZESTRIL) 20 MG tablet Take 1 tablet (20 mg total) by mouth daily.  30 tablet  6  . metoprolol tartrate (LOPRESSOR) 25 MG tablet Take 1 tablet (25 mg total) by mouth 2 (two) times daily.  60 tablet  5  . nitroGLYCERIN (NITROSTAT) 0.4 MG SL tablet Place 1 tablet (0.4 mg total) under the tongue every 5 (five) minutes as needed.  90 tablet  0  . simvastatin (ZOCOR) 40 MG tablet Take 1 tablet (40 mg total) by mouth at bedtime.  30 tablet  5  . sitaGLIPtin (JANUVIA) 100 MG tablet Take 1 tablet (100 mg total) by mouth daily.  35 tablet  5  . traZODone (DESYREL) 100  MG tablet Take 1 tablet (100 mg total) by mouth at bedtime.  30 tablet  5  . TRICOR 145 MG tablet Take 1 tablet (145 mg total) by mouth daily.  30 tablet  5   No current facility-administered medications for this visit.    Past Medical History  Diagnosis Date  . Coronary atherosclerosis of native coronary artery     BMS LAD 2006; residual 75% distal RCA; EF 50%  . Type 2 diabetes mellitus   . Essential hypertension, benign   . Mixed hyperlipidemia   . Low back pain   . MI (myocardial infarction)     Anterior 2006    Social History Edward [name] reports that he quit smoking about 26 years ago. His smoking use included Cigarettes. He has a 40 pack-year smoking history. He has never used smokeless tobacco. Edward [name] reports that he does not drink alcohol.  Review of Systems No palpitations, dizziness, syncope. Otherwise as outlined.  Physical Examination Filed Vitals:   09/16/13 1312  BP: 100/65  Pulse: 55   Filed Weights   09/16/13 1312  Weight: 182 lb 1.9 oz (82.609 kg)    Comfortable.  HEENT: Conjunctiva and lids normal, oropharynx clear.  Neck: Supple, no elevated JVP or carotid bruits, no thyromegaly.  Lungs: Clear to auscultation, nonlabored breathing at rest.  Cardiac: Regular rate and rhythm, no S3 or significant systolic murmur, no pericardial rub.  Abdomen: Soft, nontender, no hepatomegaly, bowel sounds present, no guarding or rebound.  Extremities: No pitting edema, distal pulses 2+.  Skin: Warm and dry.  Musculoskeletal: No kyphosis.  Neuropsychiatric: Alert and oriented x3, affect appropriate.   Problem List and Plan   Coronary atherosclerosis of native coronary artery Symptomatically stable medical therapy. Recent ECG reviewed. No change to current regimen.  Essential hypertension, benign Blood pressure is normal today.  HYPERLIPIDEMIA Recent LDL 82, on statin therapy.    Satira Sark, M.D., F.A.C.C.

## 2013-09-16 NOTE — Assessment & Plan Note (Signed)
Recent LDL 82, on statin therapy.

## 2013-09-16 NOTE — Assessment & Plan Note (Signed)
Blood pressure is normal today. 

## 2013-09-16 NOTE — Assessment & Plan Note (Signed)
Symptomatically stable medical therapy. Recent ECG reviewed. No change to current regimen.

## 2013-11-03 ENCOUNTER — Telehealth: Payer: Self-pay | Admitting: Nurse Practitioner

## 2013-11-03 DIAGNOSIS — E119 Type 2 diabetes mellitus without complications: Secondary | ICD-10-CM

## 2013-11-04 MED ORDER — SITAGLIPTIN PHOSPHATE 100 MG PO TABS: 100.0000 mg | ORAL_TABLET | Freq: Every day | ORAL | Status: DC

## 2013-11-04 NOTE — Telephone Encounter (Signed)
Samples up front 

## 2013-12-01 ENCOUNTER — Telehealth: Payer: Self-pay | Admitting: Nurse Practitioner

## 2013-12-01 DIAGNOSIS — E119 Type 2 diabetes mellitus without complications: Secondary | ICD-10-CM

## 2013-12-01 MED ORDER — SITAGLIPTIN PHOSPHATE 100 MG PO TABS: 100.0000 mg | ORAL_TABLET | Freq: Every day | ORAL | Status: DC

## 2013-12-01 NOTE — Telephone Encounter (Signed)
Patient aware samples up front  

## 2013-12-21 ENCOUNTER — Encounter: Payer: Self-pay | Admitting: Nurse Practitioner

## 2013-12-21 ENCOUNTER — Ambulatory Visit (INDEPENDENT_AMBULATORY_CARE_PROVIDER_SITE_OTHER): Payer: Medicaid Other | Admitting: Nurse Practitioner

## 2013-12-21 VITALS — BP 109/75 | HR 56 | Temp 97.5°F | Ht 70.0 in | Wt 180.0 lb

## 2013-12-21 DIAGNOSIS — E785 Hyperlipidemia, unspecified: Secondary | ICD-10-CM

## 2013-12-21 DIAGNOSIS — I10 Essential (primary) hypertension: Secondary | ICD-10-CM

## 2013-12-21 DIAGNOSIS — Z6825 Body mass index (BMI) 25.0-25.9, adult: Secondary | ICD-10-CM

## 2013-12-21 DIAGNOSIS — Z713 Dietary counseling and surveillance: Secondary | ICD-10-CM

## 2013-12-21 DIAGNOSIS — G4700 Insomnia, unspecified: Secondary | ICD-10-CM

## 2013-12-21 DIAGNOSIS — I2510 Atherosclerotic heart disease of native coronary artery without angina pectoris: Secondary | ICD-10-CM

## 2013-12-21 DIAGNOSIS — E119 Type 2 diabetes mellitus without complications: Secondary | ICD-10-CM

## 2013-12-21 LAB — POCT GLYCOSYLATED HEMOGLOBIN (HGB A1C): HEMOGLOBIN A1C: 7.5

## 2013-12-21 MED ORDER — TRICOR 145 MG PO TABS: 145.0000 mg | ORAL_TABLET | Freq: Every day | ORAL | Status: DC

## 2013-12-21 MED ORDER — TRAZODONE HCL 100 MG PO TABS: 100.0000 mg | ORAL_TABLET | Freq: Every day | ORAL | Status: DC

## 2013-12-21 MED ORDER — LISINOPRIL 20 MG PO TABS: 20.0000 mg | ORAL_TABLET | Freq: Every day | ORAL | Status: DC

## 2013-12-21 MED ORDER — SIMVASTATIN 40 MG PO TABS: 40.0000 mg | ORAL_TABLET | Freq: Every day | ORAL | Status: DC

## 2013-12-21 MED ORDER — METOPROLOL TARTRATE 25 MG PO TABS: 25.0000 mg | ORAL_TABLET | Freq: Two times a day (BID) | ORAL | Status: DC

## 2013-12-21 MED ORDER — GLIMEPIRIDE 4 MG PO TABS: 4.0000 mg | ORAL_TABLET | Freq: Every day | ORAL | Status: DC

## 2013-12-21 MED ORDER — GLIPIZIDE 5 MG PO TABS: ORAL_TABLET | ORAL | Status: DC

## 2013-12-21 NOTE — Patient Instructions (Signed)
Diabetes and Foot Care Diabetes may cause you to have problems because of poor blood supply (circulation) to your feet and legs. This may cause the skin on your feet to become thinner, break easier, and heal more slowly. Your skin may become dry, and the skin may peel and crack. You may also have nerve damage in your legs and feet causing decreased feeling in them. You may not notice minor injuries to your feet that could lead to infections or more serious problems. Taking care of your feet is one of the most important things you can do for yourself.  HOME CARE INSTRUCTIONS  Wear shoes at all times, even in the house. Do not go barefoot. Bare feet are easily injured.  Check your feet daily for blisters, cuts, and redness. If you cannot see the bottom of your feet, use a mirror or ask someone for help.  Wash your feet with warm water (do not use hot water) and mild soap. Then pat your feet and the areas between your toes until they are completely dry. Do not soak your feet as this can dry your skin.  Apply a moisturizing lotion or petroleum jelly (that does not contain alcohol and is unscented) to the skin on your feet and to dry, brittle toenails. Do not apply lotion between your toes.  Trim your toenails straight across. Do not dig under them or around the cuticle. File the edges of your nails with an emery board or nail file.  Do not cut corns or calluses or try to remove them with medicine.  Wear clean socks or stockings every day. Make sure they are not too tight. Do not wear knee-high stockings since they may decrease blood flow to your legs.  Wear shoes that fit properly and have enough cushioning. To break in new shoes, wear them for just a few hours a day. This prevents you from injuring your feet. Always look in your shoes before you put them on to be sure there are no objects inside.  Do not cross your legs. This may decrease the blood flow to your feet.  If you find a minor scrape,  cut, or break in the skin on your feet, keep it and the skin around it clean and dry. These areas may be cleansed with mild soap and water. Do not cleanse the area with peroxide, alcohol, or iodine.  When you remove an adhesive bandage, be sure not to damage the skin around it.  If you have a wound, look at it several times a day to make sure it is healing.  Do not use heating pads or hot water bottles. They may burn your skin. If you have lost feeling in your feet or legs, you may not know it is happening until it is too late.  Make sure your health care provider performs a complete foot exam at least annually or more often if you have foot problems. Report any cuts, sores, or bruises to your health care provider immediately. SEEK MEDICAL CARE IF:   You have an injury that is not healing.  You have cuts or breaks in the skin.  You have an ingrown nail.  You notice redness on your legs or feet.  You feel burning or tingling in your legs or feet.  You have pain or cramps in your legs and feet.  Your legs or feet are numb.  Your feet always feel cold. SEEK IMMEDIATE MEDICAL CARE IF:   There is increasing redness,   swelling, or pain in or around a wound.  There is a red line that goes up your leg.  Pus is coming from a wound.  You develop a fever or as directed by your health care provider.  You notice a bad smell coming from an ulcer or wound. Document Released: 04/20/2000 Document Revised: 12/24/2012 Document Reviewed: 09/30/2012 Exit[employer] Patient Information 2015 [employer]. This information is not intended to replace advice given to you by your health care provider. Make sure you discuss any questions you have with your health care provider.  

## 2013-12-21 NOTE — Progress Notes (Signed)
Subjective:    Patient ID: Edward [name], male    DOB: [birth year], 63 y.o.   MRN: [id number]  Patient here today for follow up of chronic medical problems. No changes since last visit-    Diabetes He presents for his follow-up diabetic visit. He has type 2 diabetes mellitus. No MedicAlert identification noted. The initial diagnosis of diabetes was made 8 years ago. His disease course has been stable. There are no hypoglycemic associated symptoms. Pertinent negatives for hypoglycemia include no headaches. Pertinent negatives for diabetes include no blurred vision, no chest pain, no fatigue, no foot paresthesias, no polydipsia, no polyphagia, no visual change and no weight loss. There are no hypoglycemic complications. Symptoms are stable. Diabetic complications include heart disease. Risk factors for coronary artery disease include dyslipidemia, hypertension and male sex. Current diabetic treatment includes oral agent (triple therapy) (added januvia at last visit.). He is compliant with treatment all of the time. His weight is stable. When asked about meal planning, he reported none. He has not had a previous visit with a dietician. He rarely participates in exercise. There is no change in his home blood glucose trend. His breakfast blood glucose is taken between 7-8 am. His breakfast blood glucose range is generally 110-130 mg/dl. (He really sees no change in blood sugars since adding [place].) An ACE inhibitor/angiotensin II receptor blocker is being taken. He sees a podiatrist.Eye exam is current (January 2014).  Hypertension This is a chronic problem. The current episode started more than 1 year ago. The problem is unchanged. The problem is controlled. Pertinent negatives include no blurred vision, chest pain, headaches, orthopnea, palpitations, peripheral edema or shortness of breath. There are no associated agents to hypertension. Risk factors for coronary artery disease include male gender,  dyslipidemia and diabetes mellitus. Past treatments include beta blockers and ACE inhibitors. The current treatment provides significant improvement. There are no compliance problems.  Hypertensive end-organ damage includes CAD/MI (2006).  Hyperlipidemia This is a chronic problem. The current episode started more than 1 year ago. The problem is controlled. Recent lipid tests were reviewed and are normal. Exacerbating diseases include diabetes. Pertinent negatives include no chest pain, leg pain, myalgias or shortness of breath. He is currently on no antihyperlipidemic treatment. There are no compliance problems.  Risk factors for coronary artery disease include diabetes mellitus, hypertension and male sex.  CAD Metoprolol daily- had MI in 2006- sees cardiologist yearly- No c/o chest pain or SOB insomnia Patient takes trazadone nightly which helps him to sleep- feels rested in AM.    Review of Systems  Constitutional: Negative for weight loss and fatigue.  Eyes: Negative for blurred vision.  Respiratory: Negative for shortness of breath.   Cardiovascular: Negative for chest pain, palpitations and orthopnea.  Endocrine: Negative for polydipsia and polyphagia.  Musculoskeletal: Negative for myalgias.  Neurological: Negative for headaches.  All other systems reviewed and are negative.      Objective:   Physical Exam  Constitutional: He is oriented to person, place, and time. He appears well-developed and well-nourished.  HENT:  Head: Normocephalic.  Right Ear: External ear normal.  Left Ear: External ear normal.  Nose: Nose normal.  Mouth/Throat: Oropharynx is clear and moist.  Eyes: EOM are normal. Pupils are equal, round, and reactive to light.  Neck: Normal range of motion. Neck supple. No thyromegaly present.  Cardiovascular: Normal rate, regular rhythm, normal heart sounds and intact distal pulses.   No murmur heard. Pulmonary/Chest: Effort normal and breath sounds normal. He has  no wheezes. He has no rales.  Abdominal: Soft. Bowel sounds are normal.  Musculoskeletal: Normal range of motion.  Neurological: He is alert and oriented to person, place, and time.  Skin: Skin is warm and dry.  Psychiatric: He has a normal mood and affect. His behavior is normal. Judgment and thought content normal.   See diabetic foot exam. BP 109/75  Pulse 56  Temp(Src) 97.5 F (36.4 C) (Oral)  Ht 5\' 10"  (1.778 m)  Wt 180 lb (81.647 kg)  BMI 25.83 kg/m2 Results for orders placed in visit on 12/21/13  POCT GLYCOSYLATED HEMOGLOBIN (HGB A1C)      Result Value Ref Range   Hemoglobin A1C 7.5        Assessment & Plan:    1. Essential hypertension, benign   2. HYPERLIPIDEMIA   3. Type II or unspecified type diabetes mellitus without mention of complication, not stated as uncontrolled   4. Atherosclerosis of native coronary artery of native heart without angina pectoris   5. Hyperlipidemia   6. Essential hypertension   7. Insomnia    Orders Placed This Encounter  Procedures  . CMP14+EGFR  . NMR, lipoprofile  . POCT glycosylated hemoglobin (Hb A1C)   Meds ordered this encounter  Medications  . TRICOR 145 MG tablet    Sig: Take 1 tablet (145 mg total) by mouth daily.    Dispense:  30 tablet    Refill:  5    Order Specific Question:  Supervising Provider    Answer:  12/23/13 [[id number]]  . simvastatin (ZOCOR) 40 MG tablet    Sig: Take 1 tablet (40 mg total) by mouth at bedtime.    Dispense:  30 tablet    Refill:  5    Order Specific Question:  Supervising Provider    Answer:  Ernestina Penna [[id number]]  . lisinopril (PRINIVIL,ZESTRIL) 20 MG tablet    Sig: Take 1 tablet (20 mg total) by mouth daily.    Dispense:  30 tablet    Refill:  5    Order Specific Question:  Supervising Provider    Answer:  Ernestina Penna [[id number]]  . traZODone (DESYREL) 100 MG tablet    Sig: Take 1 tablet (100 mg total) by mouth at bedtime.    Dispense:  30 tablet    Refill:  5    Order  Specific Question:  Supervising Provider    Answer:  Ernestina Penna [[id number]]  . metoprolol tartrate (LOPRESSOR) 25 MG tablet    Sig: Take 1 tablet (25 mg total) by mouth 2 (two) times daily.    Dispense:  60 tablet    Refill:  5    Order Specific Question:  Supervising Provider    Answer:  Ernestina Penna [[id number]]  . glipiZIDE (GLUCOTROL) 5 MG tablet    Sig: 2 in AM and one at night    Dispense:  90 tablet    Refill:  5    Order Specific Question:  Supervising Provider    Answer:  Ernestina Penna [[id number]]  . glimepiride (AMARYL) 4 MG tablet    Sig: Take 1 tablet (4 mg total) by mouth daily before breakfast.    Dispense:  30 tablet    Refill:  5    Order Specific Question:  Supervising Provider    Answer:  Ernestina Penna [[id number]]   hemoccult cards given to patient with directions Labs pending Health maintenance reviewed Diet and exercise encouraged Continue all meds Follow  up  In 3 months   [place], FNP

## 2013-12-22 LAB — CMP14+EGFR
ALBUMIN: 4.6 g/dL (ref 3.6–4.8)
ALT: 28 IU/L (ref 0–44)
AST: 35 IU/L (ref 0–40)
Albumin/Globulin Ratio: 2 (ref 1.1–2.5)
Alkaline Phosphatase: 33 IU/L — ABNORMAL LOW (ref 39–117)
BUN/Creatinine Ratio: 10 (ref 10–22)
BUN: 25 mg/dL (ref 8–27)
CALCIUM: 9.8 mg/dL (ref 8.6–10.2)
CHLORIDE: 98 mmol/L (ref 97–108)
CO2: 22 mmol/L (ref 18–29)
Creatinine, Ser: 2.48 mg/dL — ABNORMAL HIGH (ref 0.76–1.27)
GFR calc non Af Amer: 27 mL/min/{1.73_m2} — ABNORMAL LOW (ref >59)
GFR, EST AFRICAN AMERICAN: 31 mL/min/{1.73_m2} — AB (ref >59)
GLUCOSE: 136 mg/dL — AB (ref 65–99)
Globulin, Total: 2.3 g/dL (ref 1.5–4.5)
Potassium: 5.5 mmol/L — ABNORMAL HIGH (ref 3.5–5.2)
Sodium: 135 mmol/L (ref 134–144)
TOTAL PROTEIN: 6.9 g/dL (ref 6.0–8.5)
Total Bilirubin: 0.5 mg/dL (ref 0.0–1.2)

## 2013-12-22 LAB — NMR, LIPOPROFILE
CHOLESTEROL: 175 mg/dL (ref 100–199)
HDL Cholesterol by NMR: 22 mg/dL — ABNORMAL LOW (ref >39)
HDL PARTICLE NUMBER: 23 umol/L — AB (ref >=30.5)
LDL Particle Number: 1818 nmol/L — ABNORMAL HIGH (ref <1000)
LDL Size: 19.7 nm (ref >20.5)
LDLC SERPL CALC-MCNC: 98 mg/dL (ref 0–99)
LP-IR SCORE: 79 — AB (ref <=45)
Small LDL Particle Number: 1515 nmol/L — ABNORMAL HIGH (ref <=527)
Triglycerides by NMR: 274 mg/dL — ABNORMAL HIGH (ref 0–149)

## 2013-12-23 ENCOUNTER — Other Ambulatory Visit: Payer: Self-pay | Admitting: Nurse Practitioner

## 2013-12-23 ENCOUNTER — Other Ambulatory Visit: Payer: Medicaid Other

## 2013-12-23 DIAGNOSIS — E785 Hyperlipidemia, unspecified: Secondary | ICD-10-CM

## 2013-12-23 DIAGNOSIS — Z1212 Encounter for screening for malignant neoplasm of rectum: Secondary | ICD-10-CM

## 2013-12-23 MED ORDER — ATORVASTATIN CALCIUM 40 MG PO TABS: 40.0000 mg | ORAL_TABLET | Freq: Every day | ORAL | Status: DC

## 2013-12-24 ENCOUNTER — Telehealth: Payer: Self-pay | Admitting: Nurse Practitioner

## 2013-12-25 LAB — FECAL OCCULT BLOOD, IMMUNOCHEMICAL: FECAL OCCULT BLD: NEGATIVE

## 2013-12-30 NOTE — Telephone Encounter (Signed)
Pt to come back for repeat labs and he is aware.

## 2014-01-05 ENCOUNTER — Other Ambulatory Visit (INDEPENDENT_AMBULATORY_CARE_PROVIDER_SITE_OTHER): Payer: Medicaid Other

## 2014-01-05 DIAGNOSIS — E875 Hyperkalemia: Secondary | ICD-10-CM

## 2014-01-05 NOTE — Progress Notes (Signed)
Pt came in for lab  only 

## 2014-01-06 LAB — POTASSIUM: Potassium: 5 mmol/L (ref 3.5–5.2)

## 2014-02-10 ENCOUNTER — Telehealth: Payer: Self-pay | Admitting: Nurse Practitioner

## 2014-02-10 NOTE — Telephone Encounter (Signed)
Currently to not have any samples of Januvia

## 2014-02-22 ENCOUNTER — Telehealth: Payer: Self-pay | Admitting: Nurse Practitioner

## 2014-02-23 NOTE — Telephone Encounter (Signed)
Samples Up front  Patient aware

## 2014-03-03 ENCOUNTER — Telehealth: Payer: Self-pay | Admitting: Nurse Practitioner

## 2014-03-03 NOTE — Telephone Encounter (Signed)
SAMPLES UP FRONT  

## 2014-03-08 ENCOUNTER — Ambulatory Visit (INDEPENDENT_AMBULATORY_CARE_PROVIDER_SITE_OTHER): Payer: Medicaid Other | Admitting: Cardiology

## 2014-03-08 ENCOUNTER — Encounter: Payer: Self-pay | Admitting: Cardiology

## 2014-03-08 ENCOUNTER — Ambulatory Visit (INDEPENDENT_AMBULATORY_CARE_PROVIDER_SITE_OTHER): Payer: Medicaid Other | Admitting: *Deleted

## 2014-03-08 VITALS — BP 105/70 | HR 61 | Ht 70.0 in | Wt 180.0 lb

## 2014-03-08 DIAGNOSIS — Z23 Encounter for immunization: Secondary | ICD-10-CM

## 2014-03-08 DIAGNOSIS — I2510 Atherosclerotic heart disease of native coronary artery without angina pectoris: Secondary | ICD-10-CM

## 2014-03-08 DIAGNOSIS — N183 Chronic kidney disease, stage 3 unspecified: Secondary | ICD-10-CM

## 2014-03-08 DIAGNOSIS — I10 Essential (primary) hypertension: Secondary | ICD-10-CM | POA: Diagnosis not present

## 2014-03-08 DIAGNOSIS — E782 Mixed hyperlipidemia: Secondary | ICD-10-CM

## 2014-03-08 NOTE — Assessment & Plan Note (Signed)
He continues on Zocor, recent LDL 98.

## 2014-03-08 NOTE — Assessment & Plan Note (Signed)
Recent creatinine 2.4.

## 2014-03-08 NOTE — Patient Instructions (Signed)

## 2014-03-08 NOTE — Assessment & Plan Note (Signed)
Remains stable symptomatically on medical therapy. It has been 5 years since his last ischemic workup, we discussed this today, and he prefers to hold off on any further testing at this time. We have discussed warning signs and symptoms. Follow-up arranged.

## 2014-03-08 NOTE — Assessment & Plan Note (Signed)
Blood pressure is well-controlled today. 

## 2014-03-08 NOTE — Progress Notes (Signed)
Reason for visit: CAD  Clinical Summary Edward [name] is a 63 y.o.male last seen in May.He is here with his wife. He denies any progressive angina symptoms or unusual shortness of breath. He reports compliance with his medications. He states that he gets out to walk for exercise sometimes, usually no more than 20 minutes. No palpitations or syncope.  Lab work from August showed BUN 25, creatinine 2.4, potassium 5.5, normal LFTs, LDL 98, HDL 22, triglycerides 274, cholesterol 175.  Last ischemic evaluation was with low risk Cardiolite from 2010, element of anterior scar with no ischemia, LVEF 64%. I discussed this with him today and recommended that he have a follow-up stress test given the length of time. At this point he preferred observation only.  No Known Allergies  Current Outpatient Prescriptions  Medication Sig Dispense Refill  . aspirin 81 MG tablet Take 81 mg by mouth daily.      . brimonidine (ALPHAGAN) 0.15 % ophthalmic solution Place 1 drop into both eyes 2 (two) times daily.    . Cholecalciferol (VITAMIN D3) 2000 UNITS TABS Take 1 tablet by mouth daily.    . fish oil-omega-3 fatty acids 1000 MG capsule Take 1 g by mouth 2 (two) times daily.      [name] glimepiride (AMARYL) 4 MG tablet Take 1 tablet (4 mg total) by mouth daily before breakfast. 30 tablet 5  . glipiZIDE (GLUCOTROL) 5 MG tablet 2 in AM and one at night 90 tablet 5  . lisinopril (PRINIVIL,ZESTRIL) 20 MG tablet Take 1 tablet (20 mg total) by mouth daily. 30 tablet 5  . metoprolol tartrate (LOPRESSOR) 25 MG tablet Take 1 tablet (25 mg total) by mouth 2 (two) times daily. 60 tablet 5  . nitroGLYCERIN (NITROSTAT) 0.4 MG SL tablet Place 1 tablet (0.4 mg total) under the tongue every 5 (five) minutes as needed. 90 tablet 0  . simvastatin (ZOCOR) 40 MG tablet Take 40 mg by mouth daily.    . sitaGLIPtin (JANUVIA) 100 MG tablet Take 1 tablet (100 mg total) by mouth daily. 28 tablet 0  . traZODone (DESYREL) 100 MG tablet Take 1  tablet (100 mg total) by mouth at bedtime. 30 tablet 5  . TRICOR 145 MG tablet Take 1 tablet (145 mg total) by mouth daily. 30 tablet 5   No current facility-administered medications for this visit.    Past Medical History  Diagnosis Date  . Coronary atherosclerosis of native coronary artery     BMS LAD 2006; residual 75% distal RCA; EF 50%  . Type 2 diabetes mellitus   . Essential hypertension, benign   . Mixed hyperlipidemia   . Low back pain   . MI (myocardial infarction)     Anterior 2006  . CKD (chronic kidney disease) stage 3, GFR 30-59 ml/min     Social History Edward [name] reports that he quit smoking about 26 years ago. His smoking use included Cigarettes. He has a 40 pack-year smoking history. He has never used smokeless tobacco. Edward [name] reports that he does not drink alcohol.  Review of Systems Complete review of systems negative except as otherwise outlined in the clinical summary and also the following. Reports "cold" in the last few months.  Physical Examination Filed Vitals:   03/08/14 1413  BP: 105/70  Pulse: 61   Filed Weights   03/08/14 1413  Weight: 180 lb (81.647 kg)    Appears comfortable.  HEENT: Conjunctiva and lids normal, oropharynx clear.  Neck: Supple, no elevated  JVP or carotid bruits, no thyromegaly.  Lungs: Clear to auscultation, nonlabored breathing at rest.  Cardiac: Regular rate and rhythm, no S3 or significant systolic murmur, no pericardial rub.  Abdomen: Soft, nontender, no hepatomegaly, bowel sounds present, no guarding or rebound.  Extremities: No pitting edema, distal pulses 2+.  Skin: Warm and dry.  Musculoskeletal: No kyphosis.  Neuropsychiatric: Alert and oriented x3, affect appropriate.   Problem List and Plan   Coronary atherosclerosis of native coronary artery Remains stable symptomatically on medical therapy. It has been 5 years since his last ischemic workup, we discussed this today, and he prefers to hold off  on any further testing at this time. We have discussed warning signs and symptoms. Follow-up arranged.  Essential hypertension, benign Blood pressure is well controlled today.  Mixed hyperlipidemia He continues on Zocor, recent LDL 98.  CKD (chronic kidney disease) stage 3, GFR 30-59 ml/min Recent creatinine 2.4.    Satira Sark, M.D., F.A.C.C.

## 2014-03-12 LAB — HM DIABETES EYE EXAM

## 2014-03-26 ENCOUNTER — Ambulatory Visit (INDEPENDENT_AMBULATORY_CARE_PROVIDER_SITE_OTHER): Payer: Medicaid Other | Admitting: Nurse Practitioner

## 2014-03-26 ENCOUNTER — Encounter: Payer: Self-pay | Admitting: Nurse Practitioner

## 2014-03-26 VITALS — BP 115/70 | HR 50 | Temp 97.2°F

## 2014-03-26 DIAGNOSIS — I2510 Atherosclerotic heart disease of native coronary artery without angina pectoris: Secondary | ICD-10-CM

## 2014-03-26 DIAGNOSIS — I10 Essential (primary) hypertension: Secondary | ICD-10-CM

## 2014-03-26 DIAGNOSIS — N183 Chronic kidney disease, stage 3 unspecified: Secondary | ICD-10-CM

## 2014-03-26 DIAGNOSIS — E782 Mixed hyperlipidemia: Secondary | ICD-10-CM

## 2014-03-26 DIAGNOSIS — G4700 Insomnia, unspecified: Secondary | ICD-10-CM

## 2014-03-26 DIAGNOSIS — E1159 Type 2 diabetes mellitus with other circulatory complications: Secondary | ICD-10-CM

## 2014-03-26 LAB — POCT GLYCOSYLATED HEMOGLOBIN (HGB A1C)

## 2014-03-26 LAB — POCT UA - MICROALBUMIN: MICROALBUMIN (UR) POC: 50 mg/L

## 2014-03-26 MED ORDER — GLIMEPIRIDE 4 MG PO TABS: 4.0000 mg | ORAL_TABLET | Freq: Every day | ORAL | Status: DC

## 2014-03-26 MED ORDER — SIMVASTATIN 40 MG PO TABS: 40.0000 mg | ORAL_TABLET | Freq: Every day | ORAL | Status: DC

## 2014-03-26 MED ORDER — TRICOR 145 MG PO TABS: 145.0000 mg | ORAL_TABLET | Freq: Every day | ORAL | Status: DC

## 2014-03-26 MED ORDER — METOPROLOL TARTRATE 25 MG PO TABS: 25.0000 mg | ORAL_TABLET | Freq: Two times a day (BID) | ORAL | Status: DC

## 2014-03-26 MED ORDER — LISINOPRIL 20 MG PO TABS: 20.0000 mg | ORAL_TABLET | Freq: Every day | ORAL | Status: DC

## 2014-03-26 MED ORDER — GLIPIZIDE 5 MG PO TABS: ORAL_TABLET | ORAL | Status: DC

## 2014-03-26 MED ORDER — TRAZODONE HCL 100 MG PO TABS: 100.0000 mg | ORAL_TABLET | Freq: Every day | ORAL | Status: DC

## 2014-03-26 NOTE — Progress Notes (Signed)
Subjective:    Patient ID: Edward [name], male    DOB: [birth year], 63 y.o.   MRN: [id number]  Patient here today for follow up of chronic medical problems. No changes since last visit-    Diabetes He has type 2 diabetes mellitus. No MedicAlert identification noted. His disease course has been fluctuating. Pertinent negatives for hypoglycemia include no headaches. Pertinent negatives for diabetes include no chest pain, no fatigue, no polydipsia, no polyphagia and no visual change. Diabetic complications include heart disease. Risk factors for coronary artery disease include male sex, diabetes mellitus, dyslipidemia and hypertension. Current diabetic treatment includes oral agent (dual therapy). He is compliant with treatment most of the time. His weight is stable. When asked about meal planning, he reported none. He has not had a previous visit with a dietitian. He rarely participates in exercise. His home blood glucose trend is fluctuating minimally. His breakfast blood glucose is taken between 9-10 am. His breakfast blood glucose range is generally 130-140 mg/dl. His highest blood glucose is >200 mg/dl. His overall blood glucose range is 130-140 mg/dl. An ACE inhibitor/angiotensin II receptor blocker is being taken. He does not see a podiatrist.Eye exam is not current.  Hypertension This is a chronic problem. The current episode started more than 1 year ago. The problem is unchanged. The problem is controlled. Pertinent negatives include no chest pain, headaches, palpitations or shortness of breath. Past treatments include ACE inhibitors. The current treatment provides moderate improvement. Compliance problems include diet and exercise.  Hypertensive end-organ damage includes CAD/MI.  Hyperlipidemia This is a chronic problem. The current episode started more than 1 year ago. The problem is controlled. Recent lipid tests were reviewed and are normal. Exacerbating diseases include diabetes. He has no  history of hypothyroidism or obesity. Pertinent negatives include no chest pain, myalgias or shortness of breath. Current antihyperlipidemic treatment includes statins and fibric acid derivatives. The current treatment provides moderate improvement of lipids. Compliance problems include adherence to exercise and adherence to diet.   CAD Metoprolol daily- had MI in 2006- sees cardiologist yearly- No c/o chest pain or SOB insomnia Patient takes trazadone nightly which helps him to sleep- feels rested in AM.    Review of Systems  Constitutional: Negative for fatigue.  Respiratory: Negative for shortness of breath.   Cardiovascular: Negative for chest pain and palpitations.  Endocrine: Negative for polydipsia and polyphagia.  Musculoskeletal: Negative for myalgias.  Neurological: Negative for headaches.  All other systems reviewed and are negative.      Objective:   Physical Exam  Constitutional: He is oriented to person, place, and time. He appears well-developed and well-nourished.  HENT:  Head: Normocephalic.  Right Ear: External ear normal.  Left Ear: External ear normal.  Nose: Nose normal.  Mouth/Throat: Oropharynx is clear and moist.  Eyes: EOM are normal. Pupils are equal, round, and reactive to light.  Neck: Normal range of motion. Neck supple. No JVD present. No thyromegaly present.  Cardiovascular: Normal rate, regular rhythm, normal heart sounds and intact distal pulses.  Exam reveals no gallop and no friction rub.   No murmur heard. Pulmonary/Chest: Effort normal and breath sounds normal. No respiratory distress. He has no wheezes. He has no rales. He exhibits no tenderness.  Abdominal: Soft. Bowel sounds are normal. He exhibits no mass. There is no tenderness.  Genitourinary: Prostate normal and penis normal.  Musculoskeletal: Normal range of motion. He exhibits no edema.  Lymphadenopathy:    He has no cervical adenopathy.  Neurological: He  is alert and oriented to  person, place, and time. No cranial nerve deficit.  Skin: Skin is warm and dry.  Psychiatric: He has a normal mood and affect. His behavior is normal. Judgment and thought content normal.   See diabetic foot exam. BP 115/70 mmHg  Pulse 50  Temp(Src) 97.2 F (36.2 C) (Oral) Results for orders placed or performed in visit on 03/26/14  POCT glycosylated hemoglobin (Hb A1C)  Result Value Ref Range   Hemoglobin A1C 7.1%   POCT UA - Microalbumin  Result Value Ref Range   Microalbumin Ur, POC 50 mg/L      Assessment & Plan:   1. Essential hypertension, benign Do not add salt to diet - CMP14+EGFR - lisinopril (PRINIVIL,ZESTRIL) 20 MG tablet; Take 1 tablet (20 mg total) by mouth daily.  Dispense: 30 tablet; Refill: 5  2. Atherosclerosis of native coronary artery of native heart without angina pectoris - metoprolol tartrate (LOPRESSOR) 25 MG tablet; Take 1 tablet (25 mg total) by mouth 2 (two) times daily.  Dispense: 60 tablet; Refill: 5  3. Type 2 diabetes mellitus with other circulatory complications Carb counting - POCT glycosylated hemoglobin (Hb A1C) - POCT UA - Microalbumin - Microalbumin, urine - glimepiride (AMARYL) 4 MG tablet; Take 1 tablet (4 mg total) by mouth daily before breakfast.  Dispense: 30 tablet; Refill: 5 - glipiZIDE (GLUCOTROL) 5 MG tablet; 2 in AM and one at night  Dispense: 90 tablet; Refill: 5  4. CKD (chronic kidney disease) stage 3, GFR 30-59 ml/min Avoid NSAIDS  5. Mixed hyperlipidemia Low fat diet - NMR, lipoprofile - TRICOR 145 MG tablet; Take 1 tablet (145 mg total) by mouth daily.  Dispense: 30 tablet; Refill: 5 - simvastatin (ZOCOR) 40 MG tablet; Take 1 tablet (40 mg total) by mouth daily.  Dispense: 30 tablet; Refill: 5  6. Insomnia Bedtime ritual - traZODone (DESYREL) 100 MG tablet; Take 1 tablet (100 mg total) by mouth at bedtime.  Dispense: 30 tablet; Refill: 5   Pneumonia vaccine today Labs pending Health maintenance reviewed Diet  and exercise encouraged Continue all meds Follow up  In 3 months  [place], FNP

## 2014-03-26 NOTE — Patient Instructions (Signed)
Diabetes and Foot Care Diabetes may cause you to have problems because of poor blood supply (circulation) to your feet and legs. This may cause the skin on your feet to become thinner, break easier, and heal more slowly. Your skin may become dry, and the skin may peel and crack. You may also have nerve damage in your legs and feet causing decreased feeling in them. You may not notice minor injuries to your feet that could lead to infections or more serious problems. Taking care of your feet is one of the most important things you can do for yourself.  HOME CARE INSTRUCTIONS  Wear shoes at all times, even in the house. Do not go barefoot. Bare feet are easily injured.  Check your feet daily for blisters, cuts, and redness. If you cannot see the bottom of your feet, use a mirror or ask someone for help.  Wash your feet with warm water (do not use hot water) and mild soap. Then pat your feet and the areas between your toes until they are completely dry. Do not soak your feet as this can dry your skin.  Apply a moisturizing lotion or petroleum jelly (that does not contain alcohol and is unscented) to the skin on your feet and to dry, brittle toenails. Do not apply lotion between your toes.  Trim your toenails straight across. Do not dig under them or around the cuticle. File the edges of your nails with an emery board or nail file.  Do not cut corns or calluses or try to remove them with medicine.  Wear clean socks or stockings every day. Make sure they are not too tight. Do not wear knee-high stockings since they may decrease blood flow to your legs.  Wear shoes that fit properly and have enough cushioning. To break in new shoes, wear them for just a few hours a day. This prevents you from injuring your feet. Always look in your shoes before you put them on to be sure there are no objects inside.  Do not cross your legs. This may decrease the blood flow to your feet.  If you find a minor scrape,  cut, or break in the skin on your feet, keep it and the skin around it clean and dry. These areas may be cleansed with mild soap and water. Do not cleanse the area with peroxide, alcohol, or iodine.  When you remove an adhesive bandage, be sure not to damage the skin around it.  If you have a wound, look at it several times a day to make sure it is healing.  Do not use heating pads or hot water bottles. They may burn your skin. If you have lost feeling in your feet or legs, you may not know it is happening until it is too late.  Make sure your health care provider performs a complete foot exam at least annually or more often if you have foot problems. Report any cuts, sores, or bruises to your health care provider immediately. SEEK MEDICAL CARE IF:   You have an injury that is not healing.  You have cuts or breaks in the skin.  You have an ingrown nail.  You notice redness on your legs or feet.  You feel burning or tingling in your legs or feet.  You have pain or cramps in your legs and feet.  Your legs or feet are numb.  Your feet always feel cold. SEEK IMMEDIATE MEDICAL CARE IF:   There is increasing redness,   swelling, or pain in or around a wound.  There is a red line that goes up your leg.  Pus is coming from a wound.  You develop a fever or as directed by your health care provider.  You notice a bad smell coming from an ulcer or wound. Document Released: 04/20/2000 Document Revised: 12/24/2012 Document Reviewed: 09/30/2012 Exit[employer] Patient Information 2015 [employer]. This information is not intended to replace advice given to you by your health care provider. Make sure you discuss any questions you have with your health care provider.  

## 2014-03-27 LAB — CMP14+EGFR
ALT: 28 IU/L (ref 0–44)
AST: 39 IU/L (ref 0–40)
Albumin/Globulin Ratio: 2 (ref 1.1–2.5)
Albumin: 4.7 g/dL (ref 3.6–4.8)
Alkaline Phosphatase: 33 IU/L — ABNORMAL LOW (ref 39–117)
BUN/Creatinine Ratio: 11 (ref 10–22)
BUN: 22 mg/dL (ref 8–27)
CO2: 21 mmol/L (ref 18–29)
Calcium: 9.3 mg/dL (ref 8.6–10.2)
Chloride: 99 mmol/L (ref 97–108)
Creatinine, Ser: 2.04 mg/dL — ABNORMAL HIGH (ref 0.76–1.27)
GFR calc Af Amer: 39 mL/min/1.73 — ABNORMAL LOW
GFR calc non Af Amer: 34 mL/min/1.73 — ABNORMAL LOW
Globulin, Total: 2.4 g/dL (ref 1.5–4.5)
Glucose: 109 mg/dL — ABNORMAL HIGH (ref 65–99)
Potassium: 5.4 mmol/L — ABNORMAL HIGH (ref 3.5–5.2)
Sodium: 136 mmol/L (ref 134–144)
Total Bilirubin: 0.8 mg/dL (ref 0.0–1.2)
Total Protein: 7.1 g/dL (ref 6.0–8.5)

## 2014-03-27 LAB — NMR, LIPOPROFILE
Cholesterol: 123 mg/dL (ref 100–199)
HDL Cholesterol by NMR: 26 mg/dL — ABNORMAL LOW (ref >39)
HDL Particle Number: 24.5 umol/L — ABNORMAL LOW (ref >=30.5)
LDL PARTICLE NUMBER: 987 nmol/L (ref <1000)
LDL SIZE: 20.4 nm (ref >20.5)
LDL-C: 68 mg/dL (ref 0–99)
LP-IR SCORE: 64 — AB (ref <=45)
Small LDL Particle Number: 545 nmol/L — ABNORMAL HIGH (ref <=527)
TRIGLYCERIDES BY NMR: 147 mg/dL (ref 0–149)

## 2014-03-27 LAB — MICROALBUMIN, URINE: Microalbumin, Urine: 38.8 ug/mL — ABNORMAL HIGH (ref 0.0–17.0)

## 2014-04-05 ENCOUNTER — Telehealth: Payer: Self-pay | Admitting: Nurse Practitioner

## 2014-04-05 NOTE — Telephone Encounter (Signed)
No januvia samples available.

## 2014-04-28 ENCOUNTER — Telehealth: Payer: Self-pay | Admitting: Nurse Practitioner

## 2014-04-28 NOTE — Telephone Encounter (Signed)
LM,    Januvia 100 mg, qty 21 tablets at front for patient.

## 2014-05-25 ENCOUNTER — Telehealth: Payer: Self-pay | Admitting: Nurse Practitioner

## 2014-05-25 NOTE — Telephone Encounter (Signed)
Left message that we don't have any samples available

## 2014-06-01 ENCOUNTER — Telehealth: Payer: Self-pay | Admitting: Nurse Practitioner

## 2014-06-01 DIAGNOSIS — E119 Type 2 diabetes mellitus without complications: Secondary | ICD-10-CM

## 2014-06-01 MED ORDER — SITAGLIPTIN PHOSPHATE 100 MG PO TABS: 100.0000 mg | ORAL_TABLET | Freq: Every day | ORAL | Status: DC

## 2014-06-01 NOTE — Telephone Encounter (Signed)
Detailed message left that samples will be up front to be picked up.

## 2014-06-04 ENCOUNTER — Other Ambulatory Visit: Payer: Self-pay | Admitting: Nurse Practitioner

## 2014-06-04 NOTE — Telephone Encounter (Signed)
Chart says simvastatin?

## 2014-06-30 ENCOUNTER — Encounter: Payer: Self-pay | Admitting: Nurse Practitioner

## 2014-06-30 ENCOUNTER — Ambulatory Visit (INDEPENDENT_AMBULATORY_CARE_PROVIDER_SITE_OTHER): Payer: Medicaid Other | Admitting: Nurse Practitioner

## 2014-06-30 VITALS — BP 107/70 | HR 51 | Temp 97.3°F | Ht 70.0 in | Wt 177.0 lb

## 2014-06-30 DIAGNOSIS — E782 Mixed hyperlipidemia: Secondary | ICD-10-CM

## 2014-06-30 DIAGNOSIS — N183 Chronic kidney disease, stage 3 unspecified: Secondary | ICD-10-CM

## 2014-06-30 DIAGNOSIS — E1159 Type 2 diabetes mellitus with other circulatory complications: Secondary | ICD-10-CM

## 2014-06-30 DIAGNOSIS — G4700 Insomnia, unspecified: Secondary | ICD-10-CM

## 2014-06-30 DIAGNOSIS — I2510 Atherosclerotic heart disease of native coronary artery without angina pectoris: Secondary | ICD-10-CM

## 2014-06-30 DIAGNOSIS — I10 Essential (primary) hypertension: Secondary | ICD-10-CM

## 2014-06-30 DIAGNOSIS — Z125 Encounter for screening for malignant neoplasm of prostate: Secondary | ICD-10-CM

## 2014-06-30 LAB — POCT GLYCOSYLATED HEMOGLOBIN (HGB A1C): HEMOGLOBIN A1C: 8

## 2014-06-30 MED ORDER — SITAGLIPTIN PHOSPHATE 100 MG PO TABS: 100.0000 mg | ORAL_TABLET | Freq: Every day | ORAL | Status: DC

## 2014-06-30 MED ORDER — GLIPIZIDE 5 MG PO TABS: ORAL_TABLET | ORAL | Status: DC

## 2014-06-30 NOTE — Patient Instructions (Signed)

## 2014-06-30 NOTE — Progress Notes (Signed)
Subjective:    Patient ID: Edward [name], male    DOB: [birth year], 64 y.o.   MRN: [id number]  Patient here today for follow up of chronic medical problems. No changes since last visit-    Diabetes He has type 2 diabetes mellitus. No MedicAlert identification noted. His disease course has been fluctuating. Pertinent negatives for hypoglycemia include no headaches. Pertinent negatives for diabetes include no chest pain, no fatigue, no polydipsia, no polyphagia and no visual change. Diabetic complications include heart disease. Risk factors for coronary artery disease include male sex, diabetes mellitus, dyslipidemia and hypertension. Current diabetic treatment includes oral agent (dual therapy). He is compliant with treatment most of the time. His weight is stable. When asked about meal planning, he reported none. He has not had a previous visit with a dietitian. He rarely participates in exercise. His home blood glucose trend is fluctuating minimally. His breakfast blood glucose is taken between 9-10 am. His breakfast blood glucose range is generally 130-140 mg/dl. His highest blood glucose is >200 mg/dl. His overall blood glucose range is 130-140 mg/dl. An ACE inhibitor/angiotensin II receptor blocker is being taken. He does not see a podiatrist.Eye exam is not current.  Hypertension This is a chronic problem. The current episode started more than 1 year ago. The problem is unchanged. The problem is controlled. Pertinent negatives include no chest pain, headaches, palpitations or shortness of breath. Past treatments include ACE inhibitors. The current treatment provides moderate improvement. Compliance problems include diet and exercise.  Hypertensive end-organ damage includes CAD/MI.  Hyperlipidemia This is a chronic problem. The current episode started more than 1 year ago. The problem is controlled. Recent lipid tests were reviewed and are normal. Exacerbating diseases include diabetes. He has no  history of hypothyroidism or obesity. Pertinent negatives include no chest pain, myalgias or shortness of breath. Current antihyperlipidemic treatment includes statins and fibric acid derivatives. The current treatment provides moderate improvement of lipids. Compliance problems include adherence to exercise and adherence to diet.   CAD Metoprolol daily- had MI in 2006- sees cardiologist yearly- No c/o chest pain or SOB insomnia Patient takes trazadone nightly which helps him to sleep- feels rested in AM.    Review of Systems  Constitutional: Negative for fatigue.  Respiratory: Negative for shortness of breath.   Cardiovascular: Negative for chest pain and palpitations.  Endocrine: Negative for polydipsia and polyphagia.  Musculoskeletal: Negative for myalgias.  Neurological: Negative for headaches.  All other systems reviewed and are negative.      Objective:   Physical Exam  Constitutional: He is oriented to person, place, and time. He appears well-developed and well-nourished.  HENT:  Head: Normocephalic.  Right Ear: External ear normal.  Left Ear: External ear normal.  Nose: Nose normal.  Mouth/Throat: Oropharynx is clear and moist.  Eyes: EOM are normal. Pupils are equal, round, and reactive to light.  Neck: Normal range of motion. Neck supple. No JVD present. No thyromegaly present.  Cardiovascular: Normal rate, regular rhythm, normal heart sounds and intact distal pulses.  Exam reveals no gallop and no friction rub.   No murmur heard. Pulmonary/Chest: Effort normal and breath sounds normal. No respiratory distress. He has no wheezes. He has no rales. He exhibits no tenderness.  Abdominal: Soft. Bowel sounds are normal. He exhibits no mass. There is no tenderness.  Genitourinary: Prostate normal and penis normal.  Musculoskeletal: Normal range of motion. He exhibits no edema.  Lymphadenopathy:    He has no cervical adenopathy.  Neurological: He  is alert and oriented to  person, place, and time. No cranial nerve deficit.  Skin: Skin is warm and dry.  Psychiatric: He has a normal mood and affect. His behavior is normal. Judgment and thought content normal.   See diabetic foot exam. BP 107/70 mmHg  Pulse 51  Temp(Src) 97.3 F (36.3 C) (Oral)  Ht _0  (1.778 m)  Wt 177 lb (80.287 kg)  BMI 25.40 kg/m2  Results for orders placed or performed in visit on 06/30/14  POCT glycosylated hemoglobin (Hb A1C)  Result Value Ref Range   Hemoglobin A1C 8.0%       Assessment & Plan:   1. Essential hypertension, benign Do not add salt to diet - CMP14+EGFR  2. Atherosclerosis of native coronary artery of native heart without angina pectoris  3. Type 2 diabetes mellitus with other circulatory complications Strict carb counting or will need to go on injections increased glipzide to 2 po BID - POCT glycosylated hemoglobin (Hb A1C) - glipiZIDE (GLUCOTROL) 5 MG tablet; 2 po BID  Dispense: 120 tablet; Refill: 5 - sitaGLIPtin (JANUVIA) 100 MG tablet; Take 1 tablet (100 mg total) by mouth daily.  Dispense: 30 tablet; Refill: 5  4. CKD (chronic kidney disease) stage 3, GFR 30-59 ml/min  5. Mixed hyperlipidemia Low fat diet - NMR, lipoprofile  6. Insomnia Bedtime ritual  7. Prostate cancer screening - PSA, total and free    Labs pending Health maintenance reviewed Diet and exercise encouraged Continue all meds Follow up  In 3 month   [place], FNP

## 2014-07-01 ENCOUNTER — Telehealth: Payer: Self-pay | Admitting: Nurse Practitioner

## 2014-07-01 ENCOUNTER — Other Ambulatory Visit: Payer: Self-pay | Admitting: *Deleted

## 2014-07-01 DIAGNOSIS — E1159 Type 2 diabetes mellitus with other circulatory complications: Secondary | ICD-10-CM

## 2014-07-01 LAB — CMP14+EGFR
ALBUMIN: 4.6 g/dL (ref 3.6–4.8)
ALK PHOS: 32 IU/L — AB (ref 39–117)
ALT: 27 IU/L (ref 0–44)
AST: 29 IU/L (ref 0–40)
Albumin/Globulin Ratio: 1.7 (ref 1.1–2.5)
BILIRUBIN TOTAL: 0.8 mg/dL (ref 0.0–1.2)
BUN / CREAT RATIO: 14 (ref 10–22)
BUN: 30 mg/dL — AB (ref 8–27)
CHLORIDE: 98 mmol/L (ref 97–108)
CO2: 22 mmol/L (ref 18–29)
Calcium: 9.7 mg/dL (ref 8.6–10.2)
Creatinine, Ser: 2.13 mg/dL — ABNORMAL HIGH (ref 0.76–1.27)
GFR calc non Af Amer: 32 mL/min/{1.73_m2} — ABNORMAL LOW (ref >59)
GFR, EST AFRICAN AMERICAN: 37 mL/min/{1.73_m2} — AB (ref >59)
GLUCOSE: 141 mg/dL — AB (ref 65–99)
Globulin, Total: 2.7 g/dL (ref 1.5–4.5)
Potassium: 5.7 mmol/L — ABNORMAL HIGH (ref 3.5–5.2)
Sodium: 134 mmol/L (ref 134–144)
TOTAL PROTEIN: 7.3 g/dL (ref 6.0–8.5)

## 2014-07-01 LAB — NMR, LIPOPROFILE
Cholesterol: 135 mg/dL (ref 100–199)
HDL CHOLESTEROL BY NMR: 24 mg/dL — AB (ref >39)
HDL Particle Number: 22.8 umol/L — ABNORMAL LOW (ref >=30.5)
LDL PARTICLE NUMBER: 968 nmol/L (ref <1000)
LDL SIZE: 20.3 nm (ref >20.5)
LDL-C: 71 mg/dL (ref 0–99)
LP-IR Score: 74 — ABNORMAL HIGH (ref <=45)
Small LDL Particle Number: 549 nmol/L — ABNORMAL HIGH (ref <=527)
TRIGLYCERIDES BY NMR: 199 mg/dL — AB (ref 0–149)

## 2014-07-01 LAB — PSA, TOTAL AND FREE
PSA FREE PCT: 83.3 %
PSA, Free: 0.25 ng/mL
PSA: 0.3 ng/mL (ref 0.0–4.0)

## 2014-07-01 MED ORDER — SITAGLIPTIN PHOSPHATE 100 MG PO TABS: 100.0000 mg | ORAL_TABLET | Freq: Every day | ORAL | Status: DC

## 2014-07-01 NOTE — Telephone Encounter (Signed)
Januvia was set on 'sample' instead of normal, reordered and sent to [facility]. Pt aware.

## 2014-07-02 NOTE — Telephone Encounter (Signed)
Rx called in to [facility]

## 2014-08-06 ENCOUNTER — Ambulatory Visit: Payer: Medicaid Other | Admitting: Nurse Practitioner

## 2014-09-02 ENCOUNTER — Encounter: Payer: Self-pay | Admitting: Cardiology

## 2014-09-02 ENCOUNTER — Ambulatory Visit (INDEPENDENT_AMBULATORY_CARE_PROVIDER_SITE_OTHER): Payer: Medicaid Other | Admitting: Cardiology

## 2014-09-02 ENCOUNTER — Encounter: Payer: Self-pay | Admitting: *Deleted

## 2014-09-02 VITALS — BP 100/66 | HR 55 | Ht 70.0 in | Wt 177.0 lb

## 2014-09-02 DIAGNOSIS — I10 Essential (primary) hypertension: Secondary | ICD-10-CM

## 2014-09-02 DIAGNOSIS — R0602 Shortness of breath: Secondary | ICD-10-CM

## 2014-09-02 DIAGNOSIS — I2510 Atherosclerotic heart disease of native coronary artery without angina pectoris: Secondary | ICD-10-CM

## 2014-09-02 NOTE — Patient Instructions (Signed)
Your physician recommends that you continue on your current medications as directed. Please refer to the Current Medication list given to you today. Your physician has requested that you have a lexiscan myoview. For further information please visit [email or web address]. Please follow instruction sheet, as given. Your physician recommends that you schedule a follow-up appointment in: 6 months. You will receive a reminder letter in the mail in about 4 months reminding you to call and schedule your appointment. If you don't receive this letter, please contact our office. 

## 2014-09-02 NOTE — Progress Notes (Signed)
Cardiology Office Note  Date: 09/02/2014   ID: [name], DOB [birth year], MRN [id number]  PCP: [name], FNP  Primary Cardiologist: Rozann Lesches, MD   Chief Complaint  Patient presents with  . Coronary Artery Disease  . Hyperlipidemia    History of Present Illness: Edward [name] is a 64 y.o. male last seen in November 2015. He presents with his wife today for a routine follow-up visit. He does report having recent episodes of shortness of breath, otherwise on stable medical regimen. ECG is reviewed below. His most recent lab work from February is also outlined below. He continues to follow with primary care on a regular basis.  Last ischemic evaluation was with low risk Cardiolite from 2010, element of anterior scar with no ischemia, LVEF 64%. We discussed obtaining a follow-up stress test last time that he wanted to hold off. At this time he is prepared to proceed with follow-up testing.  Blood pressure is well controlled today.  Past Medical History  Diagnosis Date  . Coronary atherosclerosis of native coronary artery     BMS LAD 2006; residual 75% distal RCA; EF 50%  . Type 2 diabetes mellitus   . Essential hypertension, benign   . Mixed hyperlipidemia   . Low back pain   . MI (myocardial infarction)     Anterior 2006  . CKD (chronic kidney disease) stage 3, GFR 30-59 ml/min     History reviewed. No pertinent past surgical history.  Current Outpatient Prescriptions  Medication Sig Dispense Refill  . aspirin 81 MG tablet Take 81 mg by mouth daily.      [name] atorvastatin (LIPITOR) 40 MG tablet TAKE ONE TABLET BY MOUTH ONCE DAILY 90 tablet 0  . brimonidine (ALPHAGAN) 0.15 % ophthalmic solution Place 1 drop into both eyes 2 (two) times daily.    . Cholecalciferol (VITAMIN D3) 2000 UNITS TABS Take 1 tablet by mouth daily.    . fish oil-omega-3 fatty acids 1000 MG capsule Take 1 g by mouth 2 (two) times daily.      [name] glimepiride (AMARYL) 4 MG tablet Take 1  tablet (4 mg total) by mouth daily before breakfast. 30 tablet 5  . glipiZIDE (GLUCOTROL) 5 MG tablet 2 po BID 120 tablet 5  . lisinopril (PRINIVIL,ZESTRIL) 20 MG tablet Take 1 tablet (20 mg total) by mouth daily. 30 tablet 5  . metoprolol tartrate (LOPRESSOR) 25 MG tablet Take 1 tablet (25 mg total) by mouth 2 (two) times daily. 60 tablet 5  . nitroGLYCERIN (NITROSTAT) 0.4 MG SL tablet Place 1 tablet (0.4 mg total) under the tongue every 5 (five) minutes as needed. 90 tablet 0  . sitaGLIPtin (JANUVIA) 100 MG tablet Take 1 tablet (100 mg total) by mouth daily. 30 tablet 5  . traZODone (DESYREL) 100 MG tablet Take 1 tablet (100 mg total) by mouth at bedtime. 30 tablet 5  . TRICOR 145 MG tablet Take 1 tablet (145 mg total) by mouth daily. 30 tablet 5   No current facility-administered medications for this visit.    Allergies:  Review of patient's allergies indicates no known allergies.   Social History: The patient  reports that he quit smoking about 27 years ago. His smoking use included Cigarettes. He has a 40 pack-year smoking history. He has never used smokeless tobacco. He reports that he does not drink alcohol or use illicit drugs.   ROS:  Please see the history of present illness. Otherwise, complete review of systems is positive for  none.  All other systems are reviewed and negative.   Physical Exam: VS:  BP 100/66 mmHg  Pulse 55  Ht 5\' 10"  (1.778 m)  Wt 177 lb (80.287 kg)  BMI 25.40 kg/m2  SpO2 96%, BMI Body mass index is 25.4 kg/(m^2).  Wt Readings from Last 3 Encounters:  09/02/14 177 lb (80.287 kg)  06/30/14 177 lb (80.287 kg)  03/08/14 180 lb (81.647 kg)     Appears comfortable.  HEENT: Conjunctiva and lids normal, oropharynx clear.  Neck: Supple, no elevated JVP or carotid bruits, no thyromegaly.  Lungs: Clear to auscultation, nonlabored breathing at rest.  Cardiac: Regular rate and rhythm, no S3 or significant systolic murmur, no pericardial rub.  Abdomen: Soft,  nontender, no hepatomegaly, bowel sounds present, no guarding or rebound.  Extremities: No pitting edema, distal pulses 2+.  Skin: Warm and dry.  Musculoskeletal: No kyphosis.  Neuropsychiatric: Alert and oriented x3, affect appropriate.   ECG: ECG is ordered today and reviewed showing sinus bradycardia with low voltage.   Recent Labwork: 06/30/2014: ALT 27; AST 29; BUN 30*; Creatinine 2.13*; Potassium 5.7*; Sodium 134     Component Value Date/Time   CHOL 135 06/30/2014 1034   CHOL 177 09/25/2012 1015   TRIG 199* 06/30/2014 1034   TRIG 269* 09/25/2012 1015   HDL 24* 06/30/2014 1034   HDL 28* 09/25/2012 1015   LDLCALC 98 12/21/2013 0850   LDLCALC 95 09/25/2012 1015    Assessment and Plan:  1. CAD status post previous BMS to the LAD in 2006 with moderate residual distal RCA disease that was managed medically. He is reporting recent episodes of shortness of breath, and has agreed to follow-up ischemic testing. We will arrange a Lexiscan Cardiolite on medical therapy to reevaluate ischemic burden.  2. Hyperlipidemia, on Lipitor. Lipids reviewed.  3. Essential hypertension, blood pressure normal today.  4. CKD, stage 3-4. Recent creatinine 2.1.  Current medicines were reviewed with the patient today.   Orders Placed This Encounter  Procedures  . EKG 12-Lead    Disposition: FU with me in 6 months.   Signed, Satira Sark, MD, [facility] 09/02/2014 11:12 AM    [facility] [place], [place], [place] [address] Phone: [phone]; Fax: [phone]

## 2014-09-10 ENCOUNTER — Other Ambulatory Visit: Payer: Self-pay | Admitting: Nurse Practitioner

## 2014-09-13 ENCOUNTER — Encounter (HOSPITAL_COMMUNITY): Payer: Self-pay

## 2014-09-13 ENCOUNTER — Encounter (HOSPITAL_COMMUNITY)
Admission: RE | Admit: 2014-09-13 | Discharge: 2014-09-13 | Disposition: A | Discharge status: Home / Self Care | Payer: Medicaid Other | Source: Ambulatory Visit | Attending: Cardiology | Admitting: Cardiology

## 2014-09-13 ENCOUNTER — Ambulatory Visit (HOSPITAL_COMMUNITY)
Admission: RE | Admit: 2014-09-13 | Discharge: 2014-09-13 | Disposition: A | Discharge status: Home / Self Care | Payer: Medicaid Other | Source: Ambulatory Visit | Attending: Cardiology | Admitting: Cardiology

## 2014-09-13 DIAGNOSIS — R0602 Shortness of breath: Secondary | ICD-10-CM | POA: Diagnosis not present

## 2014-09-13 DIAGNOSIS — I2510 Atherosclerotic heart disease of native coronary artery without angina pectoris: Secondary | ICD-10-CM | POA: Diagnosis present

## 2014-09-13 LAB — NM MYOCAR MULTI W/SPECT W/WALL MOTION / EF
CHL CUP NUCLEAR SRS: 11
LV sys vol: 27 mL
LVDIAVOL: 75 mL
NUC STRESS TID: 1.08
Nuc Stress EF: 64 %
RATE: 0.2
SDS: 2
SSS: 11

## 2014-09-13 MED ORDER — SODIUM CHLORIDE 0.9 % IJ SOLN
INTRAMUSCULAR | Status: AC
  Administered 2014-09-13: 10 mL via INTRAVENOUS
  Filled 2014-09-13: qty 3

## 2014-09-13 MED ORDER — REGADENOSON 0.4 MG/5ML IV SOLN
0.4000 mg | Freq: Once | INTRAVENOUS | Status: AC
  Administered 2014-09-13: 0.4 mg via INTRAVENOUS

## 2014-09-13 MED ORDER — SODIUM CHLORIDE 0.9 % IJ SOLN
10.0000 mL | INTRAMUSCULAR | Status: DC | PRN
  Administered 2014-09-13: 10 mL via INTRAVENOUS
  Filled 2014-09-13: qty 10

## 2014-09-13 MED ORDER — REGADENOSON 0.4 MG/5ML IV SOLN
INTRAVENOUS | Status: AC
  Administered 2014-09-13: 0.4 mg via INTRAVENOUS
  Filled 2014-09-13: qty 5

## 2014-09-13 MED ORDER — TECHNETIUM TC 99M SESTAMIBI GENERIC - CARDIOLITE
30.0000 | Freq: Once | INTRAVENOUS | Status: AC | PRN
Start: 2014-09-13 — End: 2014-09-13
  Administered 2014-09-13: 30 via INTRAVENOUS

## 2014-09-13 MED ORDER — TECHNETIUM TC 99M SESTAMIBI - CARDIOLITE
10.0000 | Freq: Once | INTRAVENOUS | Status: AC | PRN
  Administered 2014-09-13: 07:00:00 10 via INTRAVENOUS

## 2014-09-15 LAB — HM DIABETES EYE EXAM

## 2014-09-16 ENCOUNTER — Telehealth: Payer: Self-pay | Admitting: *Deleted

## 2014-09-16 NOTE — Telephone Encounter (Signed)
-----   Message from Satira Sark, MD sent at 09/13/2014  4:09 PM EDT ----- Reviewed report. Overall low risk study with evidence of previous infarct scar, but no active ischemia. Would recommend medical therapy unless his symptoms progress.

## 2014-09-16 NOTE — Telephone Encounter (Signed)
Patient informed. 

## 2014-09-24 LAB — MYOCARDIAL PERFUSION IMAGING
CHL CUP MPHR: 158 {beats}/min
CHL CUP RESTING HR STRESS: 51 {beats}/min
CSEPED: 4 min
CSEPEDS: 0 s
Estimated workload: 1 METS
Peak HR: 89 {beats}/min
Percent HR: 56 %

## 2014-10-07 ENCOUNTER — Ambulatory Visit (INDEPENDENT_AMBULATORY_CARE_PROVIDER_SITE_OTHER): Payer: Medicaid Other | Admitting: Nurse Practitioner

## 2014-10-07 ENCOUNTER — Encounter: Payer: Self-pay | Admitting: Nurse Practitioner

## 2014-10-07 VITALS — BP 114/71 | HR 50 | Temp 97.5°F | Ht 70.0 in | Wt 176.0 lb

## 2014-10-07 DIAGNOSIS — E782 Mixed hyperlipidemia: Secondary | ICD-10-CM

## 2014-10-07 DIAGNOSIS — E1159 Type 2 diabetes mellitus with other circulatory complications: Secondary | ICD-10-CM | POA: Diagnosis not present

## 2014-10-07 DIAGNOSIS — G4700 Insomnia, unspecified: Secondary | ICD-10-CM

## 2014-10-07 DIAGNOSIS — I2510 Atherosclerotic heart disease of native coronary artery without angina pectoris: Secondary | ICD-10-CM | POA: Diagnosis not present

## 2014-10-07 DIAGNOSIS — I10 Essential (primary) hypertension: Secondary | ICD-10-CM

## 2014-10-07 LAB — POCT GLYCOSYLATED HEMOGLOBIN (HGB A1C): Hemoglobin A1C: 8

## 2014-10-07 MED ORDER — TRICOR 145 MG PO TABS: 145.0000 mg | ORAL_TABLET | Freq: Every day | ORAL | Status: DC

## 2014-10-07 MED ORDER — LISINOPRIL 20 MG PO TABS: 20.0000 mg | ORAL_TABLET | Freq: Every day | ORAL | Status: DC

## 2014-10-07 MED ORDER — SITAGLIPTIN PHOSPHATE 100 MG PO TABS: 100.0000 mg | ORAL_TABLET | Freq: Every day | ORAL | Status: DC

## 2014-10-07 MED ORDER — METOPROLOL TARTRATE 25 MG PO TABS: 25.0000 mg | ORAL_TABLET | Freq: Two times a day (BID) | ORAL | Status: DC

## 2014-10-07 MED ORDER — GLIPIZIDE 5 MG PO TABS: ORAL_TABLET | ORAL | Status: DC

## 2014-10-07 MED ORDER — TRAZODONE HCL 100 MG PO TABS: 100.0000 mg | ORAL_TABLET | Freq: Every day | ORAL | Status: DC

## 2014-10-07 NOTE — Patient Instructions (Signed)

## 2014-10-07 NOTE — Progress Notes (Signed)
Subjective:    Patient ID: Edward [name], male    DOB: 1950-11-08, 64 y.o.   MRN: [id number]  Patient here today for follow up of chronic medical problems. No changes since last visit-    Diabetes He has type 2 diabetes mellitus. No MedicAlert identification noted. His disease course has been fluctuating. Pertinent negatives for hypoglycemia include no headaches. Pertinent negatives for diabetes include no chest pain, no fatigue, no polydipsia, no polyphagia and no visual change. Diabetic complications include heart disease. Risk factors for coronary artery disease include male sex, diabetes mellitus, dyslipidemia and hypertension. Current diabetic treatment includes oral agent (dual therapy). He is compliant with treatment most of the time. His weight is stable. When asked about meal planning, he reported none. He has not had a previous visit with a dietitian. He rarely participates in exercise. His home blood glucose trend is fluctuating minimally. His breakfast blood glucose is taken between 9-10 am. His breakfast blood glucose range is generally 130-140 mg/dl. His highest blood glucose is >200 mg/dl. His overall blood glucose range is 130-140 mg/dl. An ACE inhibitor/angiotensin II receptor blocker is being taken. He does not see a podiatrist.Eye exam is not current.  Hypertension This is a chronic problem. The current episode started more than 1 year ago. The problem is unchanged. The problem is controlled. Pertinent negatives include no chest pain, headaches, palpitations or shortness of breath. Past treatments include ACE inhibitors. The current treatment provides moderate improvement. Compliance problems include diet and exercise.  Hypertensive end-organ damage includes CAD/MI.  Hyperlipidemia This is a chronic problem. The current episode started more than 1 year ago. The problem is controlled. Recent lipid tests were reviewed and are normal. Exacerbating diseases include diabetes. He has no  history of hypothyroidism or obesity. Pertinent negatives include no chest pain, myalgias or shortness of breath. Current antihyperlipidemic treatment includes statins and fibric acid derivatives. The current treatment provides moderate improvement of lipids. Compliance problems include adherence to exercise and adherence to diet.   CAD Metoprolol daily- had MI in 2006- sees cardiologist yearly- No c/o chest pain or SOB insomnia Patient takes trazadone nightly which helps him to sleep- feels rested in AM.    Review of Systems  Constitutional: Negative for fatigue.  Respiratory: Negative for shortness of breath.   Cardiovascular: Negative for chest pain and palpitations.  Endocrine: Negative for polydipsia and polyphagia.  Musculoskeletal: Negative for myalgias.  Neurological: Negative for headaches.  All other systems reviewed and are negative.      Objective:   Physical Exam  Constitutional: He is oriented to person, place, and time. He appears well-developed and well-nourished.  HENT:  Head: Normocephalic.  Right Ear: External ear normal.  Left Ear: External ear normal.  Nose: Nose normal.  Mouth/Throat: Oropharynx is clear and moist.  Eyes: EOM are normal. Pupils are equal, round, and reactive to light.  Neck: Normal range of motion. Neck supple. No JVD present. No thyromegaly present.  Cardiovascular: Normal rate, regular rhythm, normal heart sounds and intact distal pulses.  Exam reveals no gallop and no friction rub.   No murmur heard. Pulmonary/Chest: Effort normal and breath sounds normal. No respiratory distress. He has no wheezes. He has no rales. He exhibits no tenderness.  Abdominal: Soft. Bowel sounds are normal. He exhibits no mass. There is no tenderness.  Genitourinary: Prostate normal and penis normal.  Musculoskeletal: Normal range of motion. He exhibits no edema.  Lymphadenopathy:    He has no cervical adenopathy.  Neurological:  He is alert and oriented to  person, place, and time. No cranial nerve deficit.  Skin: Skin is warm and dry.  Psychiatric: He has a normal mood and affect. His behavior is normal. Judgment and thought content normal.   See diabetic foot exam. BP 114/71 mmHg  Pulse 50  Temp(Src) 97.5 F (36.4 C) (Oral)  Ht _0  (1.778 m)  Wt 176 lb (79.833 kg)  BMI 25.25 kg/m2  Results for orders placed or performed in visit on 10/07/14  POCT glycosylated hemoglobin (Hb A1C)  Result Value Ref Range   Hemoglobin A1C 8.0       Assessment & Plan:   1. Essential hypertension, benign Do not add salt to diet - CMP14+EGFR - lisinopril (PRINIVIL,ZESTRIL) 20 MG tablet; Take 1 tablet (20 mg total) by mouth daily.  Dispense: 30 tablet; Refill: 5  2. Type 2 diabetes mellitus with other circulatory complications Watch carbs in diet Appointment to be made with clinical pharmacist to discuss bydureon or victozia - POCT glycosylated hemoglobin (Hb A1C) - sitaGLIPtin (JANUVIA) 100 MG tablet; Take 1 tablet (100 mg total) by mouth daily.  Dispense: 30 tablet; Refill: 5 - glipiZIDE (GLUCOTROL) 5 MG tablet; 2 po BID  Dispense: 120 tablet; Refill: 5  3. Mixed hyperlipidemia Low fat diet - NMR, lipoprofile - TRICOR 145 MG tablet; Take 1 tablet (145 mg total) by mouth daily.  Dispense: 30 tablet; Refill: 5  4. Atherosclerosis of native coronary artery of native heart without angina pectoris Keep follow up with cardiologist - metoprolol tartrate (LOPRESSOR) 25 MG tablet; Take 1 tablet (25 mg total) by mouth 2 (two) times daily.  Dispense: 60 tablet; Refill: 5  5. Insomnia bedtime ritual - traZODone (DESYREL) 100 MG tablet; Take 1 tablet (100 mg total) by mouth at bedtime.  Dispense: 30 tablet; Refill: 5    Labs pending Health maintenance reviewed Diet and exercise encouraged Continue all meds Follow up  In 3 months   [place], FNP

## 2014-10-08 ENCOUNTER — Encounter: Payer: Self-pay | Admitting: *Deleted

## 2014-10-08 LAB — NMR, LIPOPROFILE
Cholesterol: 138 mg/dL (ref 100–199)
HDL Cholesterol by NMR: 22 mg/dL — ABNORMAL LOW (ref >39)
HDL Particle Number: 21.5 umol/L — ABNORMAL LOW (ref >=30.5)
LDL Particle Number: 1109 nmol/L — ABNORMAL HIGH (ref <1000)
LDL Size: 20.3 nm (ref >20.5)
LDL-C: 75 mg/dL (ref 0–99)
LP-IR Score: 74 — ABNORMAL HIGH (ref <=45)
Small LDL Particle Number: 670 nmol/L — ABNORMAL HIGH (ref <=527)
TRIGLYCERIDES BY NMR: 206 mg/dL — AB (ref 0–149)

## 2014-10-08 LAB — CMP14+EGFR
ALBUMIN: 4.5 g/dL (ref 3.6–4.8)
ALK PHOS: 30 IU/L — AB (ref 39–117)
ALT: 34 IU/L (ref 0–44)
AST: 38 IU/L (ref 0–40)
Albumin/Globulin Ratio: 2 (ref 1.1–2.5)
BUN / CREAT RATIO: 10 (ref 10–22)
BUN: 23 mg/dL (ref 8–27)
Bilirubin Total: 0.6 mg/dL (ref 0.0–1.2)
CO2: 23 mmol/L (ref 18–29)
Calcium: 9.4 mg/dL (ref 8.6–10.2)
Chloride: 100 mmol/L (ref 97–108)
Creatinine, Ser: 2.35 mg/dL — ABNORMAL HIGH (ref 0.76–1.27)
GFR calc Af Amer: 33 mL/min/{1.73_m2} — ABNORMAL LOW (ref >59)
GFR calc non Af Amer: 29 mL/min/{1.73_m2} — ABNORMAL LOW (ref >59)
Globulin, Total: 2.3 g/dL (ref 1.5–4.5)
Glucose: 127 mg/dL — ABNORMAL HIGH (ref 65–99)
Potassium: 5.7 mmol/L — ABNORMAL HIGH (ref 3.5–5.2)
Sodium: 137 mmol/L (ref 134–144)
TOTAL PROTEIN: 6.8 g/dL (ref 6.0–8.5)

## 2014-10-18 ENCOUNTER — Telehealth: Payer: Self-pay | Admitting: Nurse Practitioner

## 2014-10-18 NOTE — Telephone Encounter (Signed)
Pt thought he had a note he needed to take the his pharmacy about shots, in reading over Mary-Margaret's visit notes informed him that it was him having an appt with our Clinical Pharmacist here in the office to go over his diabetes and medications. He has an appt on 6/21 with Sharyn Lull

## 2014-10-22 ENCOUNTER — Encounter: Payer: Self-pay | Admitting: *Deleted

## 2014-12-11 ENCOUNTER — Other Ambulatory Visit: Payer: Self-pay | Admitting: Nurse Practitioner

## 2015-01-12 ENCOUNTER — Other Ambulatory Visit: Payer: Self-pay | Admitting: Nurse Practitioner

## 2015-01-12 ENCOUNTER — Ambulatory Visit: Payer: Medicaid Other | Admitting: Nurse Practitioner

## 2015-01-13 ENCOUNTER — Encounter: Payer: Self-pay | Admitting: Nurse Practitioner

## 2015-01-13 ENCOUNTER — Ambulatory Visit (INDEPENDENT_AMBULATORY_CARE_PROVIDER_SITE_OTHER): Payer: Medicaid Other | Admitting: Nurse Practitioner

## 2015-01-13 VITALS — BP 109/71 | HR 53 | Temp 97.2°F | Ht 70.0 in | Wt 176.0 lb

## 2015-01-13 DIAGNOSIS — I2510 Atherosclerotic heart disease of native coronary artery without angina pectoris: Secondary | ICD-10-CM

## 2015-01-13 DIAGNOSIS — I10 Essential (primary) hypertension: Secondary | ICD-10-CM

## 2015-01-13 DIAGNOSIS — Z1212 Encounter for screening for malignant neoplasm of rectum: Secondary | ICD-10-CM

## 2015-01-13 DIAGNOSIS — G4700 Insomnia, unspecified: Secondary | ICD-10-CM

## 2015-01-13 DIAGNOSIS — E782 Mixed hyperlipidemia: Secondary | ICD-10-CM

## 2015-01-13 DIAGNOSIS — E1159 Type 2 diabetes mellitus with other circulatory complications: Secondary | ICD-10-CM | POA: Diagnosis not present

## 2015-01-13 DIAGNOSIS — Z23 Encounter for immunization: Secondary | ICD-10-CM

## 2015-01-13 DIAGNOSIS — N183 Chronic kidney disease, stage 3 unspecified: Secondary | ICD-10-CM

## 2015-01-13 LAB — POCT GLYCOSYLATED HEMOGLOBIN (HGB A1C): Hemoglobin A1C: 7.5

## 2015-01-13 MED ORDER — GLIMEPIRIDE 4 MG PO TABS: ORAL_TABLET | ORAL | Status: DC

## 2015-01-13 MED ORDER — ATORVASTATIN CALCIUM 40 MG PO TABS: 40.0000 mg | ORAL_TABLET | Freq: Every day | ORAL | Status: DC

## 2015-01-13 NOTE — Addendum Note (Signed)
Addended by: Rolena Infante on: 01/13/2015 04:45 PM   Modules accepted: Orders

## 2015-01-13 NOTE — Patient Instructions (Signed)
Diabetes and Foot Care Diabetes may cause you to have problems because of poor blood supply (circulation) to your feet and legs. This may cause the skin on your feet to become thinner, break easier, and heal more slowly. Your skin may become dry, and the skin may peel and crack. You may also have nerve damage in your legs and feet causing decreased feeling in them. You may not notice minor injuries to your feet that could lead to infections or more serious problems. Taking care of your feet is one of the most important things you can do for yourself.  HOME CARE INSTRUCTIONS  Wear shoes at all times, even in the house. Do not go barefoot. Bare feet are easily injured.  Check your feet daily for blisters, cuts, and redness. If you cannot see the bottom of your feet, use a mirror or ask someone for help.  Wash your feet with warm water (do not use hot water) and mild soap. Then pat your feet and the areas between your toes until they are completely dry. Do not soak your feet as this can dry your skin.  Apply a moisturizing lotion or petroleum jelly (that does not contain alcohol and is unscented) to the skin on your feet and to dry, brittle toenails. Do not apply lotion between your toes.  Trim your toenails straight across. Do not dig under them or around the cuticle. File the edges of your nails with an emery board or nail file.  Do not cut corns or calluses or try to remove them with medicine.  Wear clean socks or stockings every day. Make sure they are not too tight. Do not wear knee-high stockings since they may decrease blood flow to your legs.  Wear shoes that fit properly and have enough cushioning. To break in new shoes, wear them for just a few hours a day. This prevents you from injuring your feet. Always look in your shoes before you put them on to be sure there are no objects inside.  Do not cross your legs. This may decrease the blood flow to your feet.  If you find a minor scrape,  cut, or break in the skin on your feet, keep it and the skin around it clean and dry. These areas may be cleansed with mild soap and water. Do not cleanse the area with peroxide, alcohol, or iodine.  When you remove an adhesive bandage, be sure not to damage the skin around it.  If you have a wound, look at it several times a day to make sure it is healing.  Do not use heating pads or hot water bottles. They may burn your skin. If you have lost feeling in your feet or legs, you may not know it is happening until it is too late.  Make sure your health care provider performs a complete foot exam at least annually or more often if you have foot problems. Report any cuts, sores, or bruises to your health care provider immediately. SEEK MEDICAL CARE IF:   You have an injury that is not healing.  You have cuts or breaks in the skin.  You have an ingrown nail.  You notice redness on your legs or feet.  You feel burning or tingling in your legs or feet.  You have pain or cramps in your legs and feet.  Your legs or feet are numb.  Your feet always feel cold. SEEK IMMEDIATE MEDICAL CARE IF:   There is increasing redness,   swelling, or pain in or around a wound.  There is a red line that goes up your leg.  Pus is coming from a wound.  You develop a fever or as directed by your health care provider.  You notice a bad smell coming from an ulcer or wound. Document Released: 04/20/2000 Document Revised: 12/24/2012 Document Reviewed: 09/30/2012 Exit[employer] Patient Information 2015 [employer]. This information is not intended to replace advice given to you by your health care provider. Make sure you discuss any questions you have with your health care provider.  

## 2015-01-13 NOTE — Progress Notes (Signed)
Subjective:    Patient ID: Edward [name], male    DOB: 04-07-1951, 64 y.o.   MRN: [id number]  Patient here today for follow up of chronic medical problems. No changes since last visit-    Diabetes He has type 2 diabetes mellitus. No MedicAlert identification noted. His disease course has been fluctuating. Pertinent negatives for hypoglycemia include no headaches. Pertinent negatives for diabetes include no chest pain, no fatigue, no polydipsia, no polyphagia and no visual change. Diabetic complications include heart disease. Risk factors for coronary artery disease include male sex, diabetes mellitus, dyslipidemia and hypertension. Current diabetic treatment includes oral agent (dual therapy). He is compliant with treatment most of the time. His weight is stable. When asked about meal planning, he reported none. He has not had a previous visit with a dietitian. He rarely participates in exercise. His home blood glucose trend is fluctuating minimally. His breakfast blood glucose is taken between 9-10 am. His breakfast blood glucose range is generally 130-140 mg/dl. His highest blood glucose is >200 mg/dl. His overall blood glucose range is 130-140 mg/dl. An ACE inhibitor/angiotensin II receptor blocker is being taken. He does not see a podiatrist.Eye exam is not current.  Hypertension This is a chronic problem. The current episode started more than 1 year ago. The problem is unchanged. The problem is controlled. Pertinent negatives include no chest pain, headaches, palpitations or shortness of breath. Past treatments include ACE inhibitors. The current treatment provides moderate improvement. Compliance problems include diet and exercise.  Hypertensive end-organ damage includes CAD/MI.  Hyperlipidemia This is a chronic problem. The current episode started more than 1 year ago. The problem is controlled. Recent lipid tests were reviewed and are normal. Exacerbating diseases include diabetes. He has no  history of hypothyroidism or obesity. Pertinent negatives include no chest pain, myalgias or shortness of breath. Current antihyperlipidemic treatment includes statins and fibric acid derivatives. The current treatment provides moderate improvement of lipids. Compliance problems include adherence to exercise and adherence to diet.   CAD Metoprolol daily- had MI in 2006- sees cardiologist yearly- No c/o chest pain or SOB insomnia Patient takes trazadone nightly which helps him to sleep- feels rested in AM. CKD- stage 3 Does not see kidney specialist currently- will continue to monitor  Review of Systems  Constitutional: Negative for fatigue.  Respiratory: Negative for shortness of breath.   Cardiovascular: Negative for chest pain and palpitations.  Endocrine: Negative for polydipsia and polyphagia.  Musculoskeletal: Negative for myalgias.  Neurological: Negative for headaches.  All other systems reviewed and are negative.      Objective:   Physical Exam  Constitutional: He is oriented to person, place, and time. He appears well-developed and well-nourished.  HENT:  Head: Normocephalic.  Right Ear: External ear normal.  Left Ear: External ear normal.  Nose: Nose normal.  Mouth/Throat: Oropharynx is clear and moist.  Eyes: EOM are normal. Pupils are equal, round, and reactive to light.  Neck: Normal range of motion. Neck supple. No JVD present. No thyromegaly present.  Cardiovascular: Normal rate, regular rhythm, normal heart sounds and intact distal pulses.  Exam reveals no gallop and no friction rub.   No murmur heard. Pulmonary/Chest: Effort normal and breath sounds normal. No respiratory distress. He has no wheezes. He has no rales. He exhibits no tenderness.  Abdominal: Soft. Bowel sounds are normal. He exhibits no mass. There is no tenderness.  Genitourinary: Prostate normal and penis normal.  Musculoskeletal: Normal range of motion. He exhibits no edema.  Lymphadenopathy:     He has no cervical adenopathy.  Neurological: He is alert and oriented to person, place, and time. No cranial nerve deficit.  Skin: Skin is warm and dry.  Psychiatric: He has a normal mood and affect. His behavior is normal. Judgment and thought content normal.   See diabetic foot exam. BP 109/71 mmHg  Pulse 53  Temp(Src) 97.2 F (36.2 C) (Oral)  Ht 5' 10"  (1.778 m)  Wt 176 lb (79.833 kg)  BMI 25.25 kg/m2  Results for orders placed or performed in visit on 01/13/15  POCT glycosylated hemoglobin (Hb A1C)  Result Value Ref Range   Hemoglobin A1C 7.5       Assessment & Plan:   1. Essential hypertension, benign Do not add salt to diet - CMP14+EGFR - lisinopril (PRINIVIL,ZESTRIL) 20 MG tablet; Take 1 tablet (20 mg total) by mouth daily.  Dispense: 30 tablet; Refill: 5  2. Type 2 diabetes mellitus with other circulatory complications Watch carbs in diet Appointment to be made with clinical pharmacist to discuss bydureon or victozia - POCT glycosylated hemoglobin (Hb A1C) - sitaGLIPtin (JANUVIA) 100 MG tablet; Take 1 tablet (100 mg total) by mouth daily.  Dispense: 30 tablet; Refill: 5 - glipiZIDE (GLUCOTROL) 5 MG tablet; 2 po BID  Dispense: 120 tablet; Refill: 5  3. Mixed hyperlipidemia Low fat diet - NMR, lipoprofile - TRICOR 145 MG tablet; Take 1 tablet (145 mg total) by mouth daily.  Dispense: 30 tablet; Refill: 5  4. Atherosclerosis of native coronary artery of native heart without angina pectoris Keep follow up with cardiologist - metoprolol tartrate (LOPRESSOR) 25 MG tablet; Take 1 tablet (25 mg total) by mouth 2 (two) times daily.  Dispense: 60 tablet; Refill: 5  5. Insomnia bedtime ritual - traZODone (DESYREL) 100 MG tablet; Take 1 tablet (100 mg total) by mouth at bedtime.  Dispense: 30 tablet; Refill: 5   Tetanus shot to day Labs pending Health maintenance reviewed Diet and exercise encouraged Continue all meds Follow up  In 3 months   [place], FNP

## 2015-01-14 LAB — CMP14+EGFR
ALK PHOS: 34 IU/L — AB (ref 39–117)
ALT: 30 IU/L (ref 0–44)
AST: 31 IU/L (ref 0–40)
Albumin/Globulin Ratio: 2 (ref 1.1–2.5)
Albumin: 4.6 g/dL (ref 3.6–4.8)
BILIRUBIN TOTAL: 0.6 mg/dL (ref 0.0–1.2)
BUN/Creatinine Ratio: 11 (ref 10–22)
BUN: 24 mg/dL (ref 8–27)
CHLORIDE: 99 mmol/L (ref 97–108)
CO2: 21 mmol/L (ref 18–29)
Calcium: 9.3 mg/dL (ref 8.6–10.2)
Creatinine, Ser: 2.25 mg/dL — ABNORMAL HIGH (ref 0.76–1.27)
GFR calc Af Amer: 35 mL/min/{1.73_m2} — ABNORMAL LOW (ref >59)
GFR calc non Af Amer: 30 mL/min/{1.73_m2} — ABNORMAL LOW (ref >59)
GLOBULIN, TOTAL: 2.3 g/dL (ref 1.5–4.5)
GLUCOSE: 140 mg/dL — AB (ref 65–99)
POTASSIUM: 5.2 mmol/L (ref 3.5–5.2)
SODIUM: 138 mmol/L (ref 134–144)
Total Protein: 6.9 g/dL (ref 6.0–8.5)

## 2015-01-14 LAB — LIPID PANEL
CHOLESTEROL TOTAL: 137 mg/dL (ref 100–199)
Chol/HDL Ratio: 5.7 ratio units — ABNORMAL HIGH (ref 0.0–5.0)
HDL: 24 mg/dL — ABNORMAL LOW (ref >39)
LDL Calculated: 73 mg/dL (ref 0–99)
Triglycerides: 202 mg/dL — ABNORMAL HIGH (ref 0–149)
VLDL Cholesterol Cal: 40 mg/dL (ref 5–40)

## 2015-01-24 ENCOUNTER — Telehealth: Payer: Self-pay

## 2015-01-24 NOTE — Telephone Encounter (Signed)
[facility] to clinical pools

## 2015-01-31 ENCOUNTER — Encounter: Payer: Self-pay | Admitting: *Deleted

## 2015-02-11 ENCOUNTER — Other Ambulatory Visit: Payer: Self-pay | Admitting: Nurse Practitioner

## 2015-04-10 ENCOUNTER — Other Ambulatory Visit: Payer: Self-pay | Admitting: Nurse Practitioner

## 2015-04-18 ENCOUNTER — Ambulatory Visit (INDEPENDENT_AMBULATORY_CARE_PROVIDER_SITE_OTHER): Payer: Medicaid Other | Admitting: Nurse Practitioner

## 2015-04-18 ENCOUNTER — Encounter: Payer: Self-pay | Admitting: Nurse Practitioner

## 2015-04-18 VITALS — BP 112/74 | HR 54 | Temp 97.3°F | Ht 70.0 in | Wt 174.0 lb

## 2015-04-18 DIAGNOSIS — G4700 Insomnia, unspecified: Secondary | ICD-10-CM

## 2015-04-18 DIAGNOSIS — Z1159 Encounter for screening for other viral diseases: Secondary | ICD-10-CM

## 2015-04-18 DIAGNOSIS — Z23 Encounter for immunization: Secondary | ICD-10-CM

## 2015-04-18 DIAGNOSIS — N183 Chronic kidney disease, stage 3 unspecified: Secondary | ICD-10-CM

## 2015-04-18 DIAGNOSIS — I10 Essential (primary) hypertension: Secondary | ICD-10-CM | POA: Diagnosis not present

## 2015-04-18 DIAGNOSIS — I2510 Atherosclerotic heart disease of native coronary artery without angina pectoris: Secondary | ICD-10-CM | POA: Diagnosis not present

## 2015-04-18 DIAGNOSIS — Z1212 Encounter for screening for malignant neoplasm of rectum: Secondary | ICD-10-CM | POA: Diagnosis not present

## 2015-04-18 DIAGNOSIS — E782 Mixed hyperlipidemia: Secondary | ICD-10-CM | POA: Diagnosis not present

## 2015-04-18 DIAGNOSIS — E1159 Type 2 diabetes mellitus with other circulatory complications: Secondary | ICD-10-CM | POA: Diagnosis not present

## 2015-04-18 LAB — POCT GLYCOSYLATED HEMOGLOBIN (HGB A1C): Hemoglobin A1C: 8.2

## 2015-04-18 MED ORDER — GLIMEPIRIDE 4 MG PO TABS: ORAL_TABLET | ORAL | Status: DC

## 2015-04-18 MED ORDER — ATORVASTATIN CALCIUM 40 MG PO TABS: 40.0000 mg | ORAL_TABLET | Freq: Every day | ORAL | Status: DC

## 2015-04-18 MED ORDER — LISINOPRIL 20 MG PO TABS: 20.0000 mg | ORAL_TABLET | Freq: Every day | ORAL | Status: DC

## 2015-04-18 MED ORDER — METOPROLOL TARTRATE 25 MG PO TABS: 25.0000 mg | ORAL_TABLET | Freq: Two times a day (BID) | ORAL | Status: DC

## 2015-04-18 MED ORDER — GLIPIZIDE 5 MG PO TABS: ORAL_TABLET | ORAL | Status: DC

## 2015-04-18 MED ORDER — TRICOR 145 MG PO TABS: 145.0000 mg | ORAL_TABLET | Freq: Every day | ORAL | Status: DC

## 2015-04-18 MED ORDER — SITAGLIPTIN PHOSPHATE 100 MG PO TABS: 100.0000 mg | ORAL_TABLET | Freq: Every day | ORAL | Status: DC

## 2015-04-18 NOTE — Patient Instructions (Signed)

## 2015-04-18 NOTE — Progress Notes (Signed)
Subjective:    Patient ID: Edward [name], male    DOB: Aug 26, 1950, 64 y.o.   MRN: [id number]  Patient here today for follow up of chronic medical problems. No changes since last visit-    Diabetes He has type 2 diabetes mellitus. No MedicAlert identification noted. His disease course has been fluctuating. Pertinent negatives for hypoglycemia include no headaches. Pertinent negatives for diabetes include no chest pain, no fatigue, no polydipsia, no polyphagia and no visual change. Diabetic complications include heart disease. Risk factors for coronary artery disease include male sex, diabetes mellitus, dyslipidemia and hypertension. Current diabetic treatment includes oral agent (dual therapy). He is compliant with treatment most of the time. His weight is stable. When asked about meal planning, he reported none. He has not had a previous visit with a dietitian. He rarely participates in exercise. His home blood glucose trend is fluctuating minimally. His breakfast blood glucose is taken between 9-10 am. His breakfast blood glucose range is generally 130-140 mg/dl. His highest blood glucose is >200 mg/dl. His overall blood glucose range is 130-140 mg/dl. An ACE inhibitor/angiotensin II receptor blocker is being taken. He does not see a podiatrist.Eye exam is not current.  Hypertension This is a chronic problem. The current episode started more than 1 year ago. The problem is unchanged. The problem is controlled. Pertinent negatives include no chest pain, headaches, palpitations or shortness of breath. Past treatments include ACE inhibitors. The current treatment provides moderate improvement. Compliance problems include diet and exercise.  Hypertensive end-organ damage includes CAD/MI.  Hyperlipidemia This is a chronic problem. The current episode started more than 1 year ago. The problem is controlled. Recent lipid tests were reviewed and are normal. Exacerbating diseases include diabetes. He has no  history of hypothyroidism or obesity. Pertinent negatives include no chest pain, myalgias or shortness of breath. Current antihyperlipidemic treatment includes statins and fibric acid derivatives. The current treatment provides moderate improvement of lipids. Compliance problems include adherence to exercise and adherence to diet.   CAD Metoprolol daily- had MI in 2006- sees cardiologist yearly- No c/o chest pain or SOB insomnia Patient takes trazadone nightly which helps him to sleep- feels rested in AM. CKD- stage 3 Does not see kidney specialist currently- will continue to monitor  Review of Systems  Constitutional: Negative for fatigue.  Respiratory: Negative for shortness of breath.   Cardiovascular: Negative for chest pain and palpitations.  Endocrine: Negative for polydipsia and polyphagia.  Musculoskeletal: Negative for myalgias.  Neurological: Negative for headaches.  All other systems reviewed and are negative.      Objective:   Physical Exam  Constitutional: He is oriented to person, place, and time. He appears well-developed and well-nourished.  HENT:  Head: Normocephalic.  Right Ear: External ear normal.  Left Ear: External ear normal.  Nose: Nose normal.  Mouth/Throat: Oropharynx is clear and moist.  Eyes: EOM are normal. Pupils are equal, round, and reactive to light.  Neck: Normal range of motion. Neck supple. No JVD present. No thyromegaly present.  Cardiovascular: Normal rate, regular rhythm, normal heart sounds and intact distal pulses.  Exam reveals no gallop and no friction rub.   No murmur heard. Pulmonary/Chest: Effort normal and breath sounds normal. No respiratory distress. He has no wheezes. He has no rales. He exhibits no tenderness.  Abdominal: Soft. Bowel sounds are normal. He exhibits no mass. There is no tenderness.  Genitourinary: Prostate normal and penis normal.  Musculoskeletal: Normal range of motion. He exhibits no edema.  Lymphadenopathy:     He has no cervical adenopathy.  Neurological: He is alert and oriented to person, place, and time. No cranial nerve deficit.  Skin: Skin is warm and dry.  Psychiatric: He has a normal mood and affect. His behavior is normal. Judgment and thought content normal.   BP 112/74 mmHg  Pulse 54  Temp(Src) 97.3 F (36.3 C) (Oral)  Ht 5' 10"  (1.778 m)  Wt 174 lb (78.926 kg)  BMI 24.97 kg/m2  Results for orders placed or performed in visit on 04/18/15  POCT glycosylated hemoglobin (Hb A1C)  Result Value Ref Range   Hemoglobin A1C 8.2       Assessment & Plan:  1. Type 2 diabetes mellitus with other circulatory complications (HCC) Would like for patient to see clinical pharmacist to discuss victozia or byetta injections Stricter carb counting Stopped glucatrol because patient was on 2 sulfonaurea medications - POCT glycosylated hemoglobin (Hb A1C) - Lipid panel - Microalbumin / creatinine urine ratio - sitaGLIPtin (JANUVIA) 100 MG tablet; Take 1 tablet (100 mg total) by mouth daily.  Dispense: 30 tablet; Refill: 5 -- glimepiride (AMARYL) 4 MG tablet; TAKE ONE TABLET BY MOUTH ONCE DAILY BEFORE BREAKFAST  Dispense: 90 tablet; Refill: 1  2. Essential hypertension Do not add salt to diet - CMP14+EGFR - metoprolol tartrate (LOPRESSOR) 25 MG tablet; Take 1 tablet (25 mg total) by mouth 2 (two) times daily.  Dispense: 60 tablet; Refill: 5 - lisinopril (PRINIVIL,ZESTRIL) 20 MG tablet; Take 1 tablet (20 mg total) by mouth daily.  Dispense: 30 tablet; Refill: 5   3. Atherosclerosis of native coronary artery of native heart without angina pectoris Continue low carb diet  4. CKD (chronic kidney disease) stage 3, GFR 30-59 ml/min Will continue to watch- may need to see nephrology- will wait on lab results  6. Mixed hyperlipidemia Low fta diet - atorvastatin (LIPITOR) 40 MG tablet; Take 1 tablet (40 mg total) by mouth daily.  Dispense: 90 tablet; Refill: 1 - TRICOR 145 MG tablet; Take 1 tablet  (145 mg total) by mouth daily.  Dispense: 30 tablet; Refill: 5  7. Insomnia Bedtime ritual   hemoccult cards given to patient with directions Labs pending Health maintenance reviewed Diet and exercise encouraged Continue all meds Follow up  In 3 months   [place], FNP

## 2015-04-19 LAB — LIPID PANEL
Chol/HDL Ratio: 6.3 ratio — ABNORMAL HIGH (ref 0.0–5.0)
Cholesterol, Total: 133 mg/dL (ref 100–199)
HDL: 21 mg/dL — ABNORMAL LOW
LDL Calculated: 76 mg/dL (ref 0–99)
Triglycerides: 182 mg/dL — ABNORMAL HIGH (ref 0–149)
VLDL Cholesterol Cal: 36 mg/dL (ref 5–40)

## 2015-04-19 LAB — CMP14+EGFR
A/G RATIO: 1.9 (ref 1.1–2.5)
ALT: 28 IU/L (ref 0–44)
AST: 33 IU/L (ref 0–40)
Albumin: 4.4 g/dL (ref 3.6–4.8)
Alkaline Phosphatase: 36 IU/L — ABNORMAL LOW (ref 39–117)
BUN/Creatinine Ratio: 11 (ref 10–22)
BUN: 22 mg/dL (ref 8–27)
Bilirubin Total: 0.7 mg/dL (ref 0.0–1.2)
CO2: 25 mmol/L (ref 18–29)
Calcium: 9.4 mg/dL (ref 8.6–10.2)
Chloride: 99 mmol/L (ref 96–106)
Creatinine, Ser: 1.97 mg/dL — ABNORMAL HIGH (ref 0.76–1.27)
GFR calc Af Amer: 41 mL/min/{1.73_m2} — ABNORMAL LOW (ref >59)
GFR, EST NON AFRICAN AMERICAN: 35 mL/min/{1.73_m2} — AB (ref >59)
Globulin, Total: 2.3 g/dL (ref 1.5–4.5)
Glucose: 169 mg/dL — ABNORMAL HIGH (ref 65–99)
POTASSIUM: 5.3 mmol/L — AB (ref 3.5–5.2)
Sodium: 137 mmol/L (ref 134–144)
Total Protein: 6.7 g/dL (ref 6.0–8.5)

## 2015-04-19 LAB — HEPATITIS C ANTIBODY: Hep C Virus Ab: 0.1 {s_co_ratio} (ref 0.0–0.9)

## 2015-04-20 ENCOUNTER — Other Ambulatory Visit: Payer: Medicaid Other

## 2015-04-20 DIAGNOSIS — Z1212 Encounter for screening for malignant neoplasm of rectum: Secondary | ICD-10-CM

## 2015-04-20 NOTE — Progress Notes (Signed)
Lab only 

## 2015-04-22 LAB — FECAL OCCULT BLOOD, IMMUNOCHEMICAL: Fecal Occult Bld: NEGATIVE

## 2015-04-23 ENCOUNTER — Other Ambulatory Visit: Payer: Self-pay | Admitting: *Deleted

## 2015-04-23 MED ORDER — NITROGLYCERIN 0.4 MG SL SUBL: 0.4000 mg | SUBLINGUAL_TABLET | SUBLINGUAL | Status: DC | PRN

## 2015-04-25 ENCOUNTER — Telehealth: Payer: Self-pay | Admitting: Nurse Practitioner

## 2015-04-25 ENCOUNTER — Ambulatory Visit (INDEPENDENT_AMBULATORY_CARE_PROVIDER_SITE_OTHER): Payer: Medicaid Other | Admitting: Pharmacist

## 2015-04-25 ENCOUNTER — Encounter: Payer: Self-pay | Admitting: Pharmacist

## 2015-04-25 VITALS — BP 108/62 | HR 66 | Ht 69.5 in | Wt 173.5 lb

## 2015-04-25 DIAGNOSIS — E782 Mixed hyperlipidemia: Secondary | ICD-10-CM | POA: Diagnosis not present

## 2015-04-25 DIAGNOSIS — N183 Chronic kidney disease, stage 3 unspecified: Secondary | ICD-10-CM

## 2015-04-25 DIAGNOSIS — E1159 Type 2 diabetes mellitus with other circulatory complications: Secondary | ICD-10-CM | POA: Diagnosis not present

## 2015-04-25 MED ORDER — ALBIGLUTIDE 30 MG ~~LOC~~ PEN: 30.0000 mg | PEN_INJECTOR | SUBCUTANEOUS | Status: DC

## 2015-04-25 NOTE — Progress Notes (Signed)
Subjective:    Edward [name] is a 64 y.o. male who presents for an initial evaluation of Type 2 diabetes mellitus.  Current symptoms/problems include hyperglycemia and have been worsening.  Current diabetes medications:  Januvia 100mg  1 tablet daily and glimepiride 4mg  1 tablet with breakfast.  Until last week he was also taking glipizide 5mg  2 tablets twice a day  Known diabetic complications: nephropathy and cardiovascular disease Cardiovascular risk factors: advanced age (older than 84 for men, 26 for women), diabetes mellitus, dyslipidemia, family history of premature cardiovascular disease and male gender  Eye exam current (within one year): yes Weight trend: stable Prior visit with dietician: no Current diet: not currently following any specific dietary recommendation except to limit potassium due to history of elevated potassium and stage 3 CKD Current exercise: none  Current monitoring regimen: home blood tests - checked BG qd to qod Home blood sugar records: fasting range:  only checks in am and per patient recent readings have been 99-145 Any episodes of hypoglycemia? no  Is He on ACE inhibitor or angiotensin II receptor blocker?  Yes  lisinopril (Zestril)    The following portions of the patient's history were reviewed and updated as appropriate: allergies, current medications, past family history, past medical history, past social history, past surgical history and problem list.  Review of Systems Review of Systems  Constitutional: Negative.   Respiratory: Negative.   Cardiovascular: Negative.   Genitourinary: Negative.   Musculoskeletal: Negative.   Skin: Negative.   Endo/Heme/Allergies: Negative.     Objective:    BP 108/62 mmHg  Pulse 66  Ht 5' 9.5" (1.765 m)  Wt 173 lb 8 oz (78.699 kg)  BMI 25.26 kg/m2  RBG in office was 270 today  Lab Review GLUCOSE (mg/dL)  Date Value  04/18/2015 169*  01/13/2015 140*  10/07/2014 127*   GLUCOSE, BLD (mg/dL)   Date Value  10/07/2012 157*  09/25/2012 178*  12/09/2008 52*   CO2 (mmol/L)  Date Value  04/18/2015 25  01/13/2015 21  10/07/2014 23   BUN (mg/dL)  Date Value  04/18/2015 22  01/13/2015 24  10/07/2014 23  10/07/2012 26*  09/25/2012 24*  12/09/2008 20   CREAT (mg/dL)  Date Value  10/07/2012 2.16*  09/25/2012 2.18*   CREATININE, SER (mg/dL)  Date Value  04/18/2015 1.97*  01/13/2015 2.25*  10/07/2014 2.35*     Assessment:    Diabetes Mellitus type II, under inadequate control.   mixed hyperlipidemia with LDL at goal with current therapy but Tg remained high likely due to diet and uncontrolled tyep 2 DM BP - controlled Stage 3 CKD - last estimated GFR was 35 Plan:    1.  Rx changes: discontinue Januvia.  Start Tanzeum 30mg  inject SQ once weekly.  Spent 15 minutes instucting on proper mixingof medication and injection.  Rx sent ot pharmacy.  Patient was given #28 day supply free card.    Continue glimepiride 4mg  take 1 tablet qam with breakfast. 2.  Education: Reviewed 'ABCs' of diabetes management (respective goals in parentheses):  A1C (<7), blood pressure (<130/80), and cholesterol (LDL <100). 3.  Discussed BG goals with patient.  He is instructed to take BG 1 to 2 times daily and to vary the timing of BG checked - some fasting and some post prandial.  Gave BG log to bring back to next visit.  4.  Discussed increasing exercise - recommended start with 10 ti 15 minutes of walking daily - can walk at [facility] to  [employer] is weather is too cold.  5. Patient did not leave urine sample last week to check urine microalbumin but he left today.  6.  Follow up: 4 weeks    Cherre Robins, PharmD, CPP, CDE

## 2015-04-25 NOTE — Telephone Encounter (Signed)
[facility]

## 2015-04-25 NOTE — Telephone Encounter (Signed)
Left message on VM to CB

## 2015-04-25 NOTE — Patient Instructions (Signed)
Diabetes and Standards of Medical Care   Diabetes is complicated. You may find that your diabetes team includes a dietitian, nurse, diabetes educator, eye doctor, and more. To help everyone know what is going on and to help you get the care you deserve, the following schedule of care was developed to help keep you on track. Below are the tests, exams, vaccines, medicines, education, and plans you will need.  Blood Glucose Goals Prior to meals = 80 - 130 Within 2 hours of the start of a meal = less than 180  HbA1c test (goal is less than 7.0% - your last value was %) This test shows how well you have controlled your glucose over the past 2 to 3 months. It is used to see if your diabetes management plan needs to be adjusted.   It is performed at least 2 times a year if you are meeting treatment goals.  It is performed 4 times a year if therapy has changed or if you are not meeting treatment goals.  Blood pressure test  This test is performed at every routine medical visit. The goal is less than 140/90 mmHg for most people, but 130/80 mmHg in some cases. Ask your health care provider about your goal.  Dental exam  Follow up with the dentist regularly.  Eye exam  If you are diagnosed with type 1 diabetes as a child, get an exam upon reaching the age of 10 years or older and have had diabetes for 3 to 5 years. Yearly eye exams are recommended after that initial eye exam.  If you are diagnosed with type 1 diabetes as an adult, get an exam within 5 years of diagnosis and then yearly.  If you are diagnosed with type 2 diabetes, get an exam as soon as possible after the diagnosis and then yearly.  Foot care exam  Visual foot exams are performed at every routine medical visit. The exams check for cuts, injuries, or other problems with the feet.  A comprehensive foot exam should be done yearly. This includes visual inspection as well as assessing foot pulses and testing for loss of  sensation.  Check your feet nightly for cuts, injuries, or other problems with your feet. Tell your health care provider if anything is not healing.  Kidney function test (urine microalbumin)  This test is performed once a year.  Type 1 diabetes: The first test is performed 5 years after diagnosis.  Type 2 diabetes: The first test is performed at the time of diagnosis.  A serum creatinine and estimated glomerular filtration rate (eGFR) test is done once a year to assess the level of chronic kidney disease (CKD), if present.  Lipid profile (cholesterol, HDL, LDL, triglycerides)  Performed every 5 years for most people.  The goal for LDL is less than 100 mg/dL. If you are at high risk, the goal is less than 70 mg/dL.  The goal for HDL is 40 mg/dL to 50 mg/dL for men and 50 mg/dL to 60 mg/dL for women. An HDL cholesterol of 60 mg/dL or higher gives some protection against heart disease.  The goal for triglycerides is less than 150 mg/dL.  Influenza vaccine, pneumococcal vaccine, and hepatitis B vaccine  The influenza vaccine is recommended yearly.  The pneumococcal vaccine is generally given once in a lifetime. However, there are some instances when another vaccination is recommended. Check with your health care provider.  The hepatitis B vaccine is also recommended for adults with diabetes.    Diabetes self-management education  Education is recommended at diagnosis and ongoing as needed.  Treatment plan  Your treatment plan is reviewed at every medical visit.  Document Released: 02/18/2009 Document Revised: 12/24/2012 Document Reviewed: 09/23/2012 [employer] Patient Information 2014 [employer].   

## 2015-04-26 ENCOUNTER — Telehealth: Payer: Self-pay

## 2015-04-26 LAB — MICROALBUMIN / CREATININE URINE RATIO
CREATININE, UR: 76.7 mg/dL
MICROALB/CREAT RATIO: 5 mg/g creat (ref 0.0–30.0)
Microalbumin, Urine: 3.8 ug/mL

## 2015-04-26 NOTE — Telephone Encounter (Signed)
Insurance prior authorized Tanzeum 30 mg

## 2015-04-27 ENCOUNTER — Other Ambulatory Visit: Payer: Self-pay | Admitting: Nurse Practitioner

## 2015-04-27 NOTE — Telephone Encounter (Signed)
I have left several messages with no return call.

## 2015-04-28 ENCOUNTER — Encounter: Payer: Self-pay | Admitting: Pharmacist

## 2015-04-29 ENCOUNTER — Other Ambulatory Visit: Payer: Self-pay | Admitting: Nurse Practitioner

## 2015-04-29 MED ORDER — ACCU-CHEK AVIVA DEVI: Status: AC

## 2015-04-29 MED ORDER — GLUCOSE BLOOD VI STRP: ORAL_STRIP | Status: DC

## 2015-04-29 NOTE — Telephone Encounter (Signed)
done

## 2015-04-29 NOTE — Telephone Encounter (Signed)
Done pt aware °

## 2015-05-04 ENCOUNTER — Encounter: Payer: Self-pay | Admitting: Cardiology

## 2015-05-04 ENCOUNTER — Ambulatory Visit (INDEPENDENT_AMBULATORY_CARE_PROVIDER_SITE_OTHER): Payer: Medicaid Other | Admitting: Cardiology

## 2015-05-04 VITALS — BP 112/88 | HR 68 | Ht 70.0 in | Wt 174.0 lb

## 2015-05-04 DIAGNOSIS — E782 Mixed hyperlipidemia: Secondary | ICD-10-CM

## 2015-05-04 DIAGNOSIS — I2510 Atherosclerotic heart disease of native coronary artery without angina pectoris: Secondary | ICD-10-CM | POA: Diagnosis not present

## 2015-05-04 DIAGNOSIS — I10 Essential (primary) hypertension: Secondary | ICD-10-CM | POA: Diagnosis not present

## 2015-05-04 NOTE — Patient Instructions (Signed)
Continue all current medications. Your physician wants you to follow up in: 6 months.  You will receive a reminder letter in the mail one-two months in advance.  If you don't receive a letter, please call our office to schedule the follow up appointment   

## 2015-05-04 NOTE — Progress Notes (Signed)
Cardiology Office Note  Date: 05/04/2015   ID: CLIMMIE [name], DOB Feb 28, 1951, MRN [id number]  PCP: [name], FNP  Primary Cardiologist: Rozann Lesches, MD   Chief Complaint  Patient presents with  . Coronary Artery Disease    History of Present Illness: Edward [name] is a 64 y.o. male last seen in April. He is here today with his wife for a follow-up visit. Since last encounter he does not endorse any angina symptoms or increasing shortness of breath with typical activities. He reports no nitroglycerin use.  He continues to follow with [facility]. Adjustments have been made in his diabetes regimen. He is now on Albiglutide.  Follow-up ischemic testing from May of this year as outlined below, overall low risk with area of inferior wall scar, but no active ischemia.  We reviewed these results again today.  Blood pressure today is well controlled.  Most recent LDL was 76 on Lipitor.  Past Medical History  Diagnosis Date  . Coronary atherosclerosis of native coronary artery     BMS LAD 2006; residual 75% distal RCA; EF 50%  . Type 2 diabetes mellitus (Edward [name])   . Essential hypertension, benign   . Mixed hyperlipidemia   . Low back pain   . MI (myocardial infarction) (Edward [name])     Anterior 2006  . CKD (chronic kidney disease) stage 3, GFR 30-59 ml/min   . Cataract   . Glaucoma     Current Outpatient Prescriptions  Medication Sig Dispense Refill  . Albiglutide 30 MG PEN Inject 30 mg into the skin once a week. 4 each 2  . aspirin 81 MG tablet Take 81 mg by mouth daily.      [name] atorvastatin (LIPITOR) 40 MG tablet Take 1 tablet (40 mg total) by mouth daily. 90 tablet 1  . Blood Glucose Monitoring Suppl (ACCU-CHEK AVIVA) device Test blood sugar qd. Dx E11.59 1 each 0  . brimonidine (ALPHAGAN) 0.15 % ophthalmic solution Place 1 drop into both eyes 2 (two) times daily.    . Cholecalciferol (VITAMIN D3) 2000 UNITS TABS Take 1 tablet by mouth daily.     . fish oil-omega-3 fatty acids 1000 MG capsule Take 1 g by mouth 2 (two) times daily.      [name] glimepiride (AMARYL) 4 MG tablet TAKE ONE TABLET BY MOUTH ONCE DAILY BEFORE BREAKFAST 90 tablet 1  . glucose blood (ACCU-CHEK AVIVA) test strip Test blood sugar qd. DX E11.59 100 each 4  . lisinopril (PRINIVIL,ZESTRIL) 20 MG tablet Take 1 tablet (20 mg total) by mouth daily. 30 tablet 5  . metoprolol tartrate (LOPRESSOR) 25 MG tablet Take 1 tablet (25 mg total) by mouth 2 (two) times daily. 60 tablet 5  . nitroGLYCERIN (NITROSTAT) 0.4 MG SL tablet Place 1 tablet (0.4 mg total) under the tongue every 5 (five) minutes as needed. 25 tablet 2  . traZODone (DESYREL) 100 MG tablet TAKE ONE TABLET BY MOUTH AT BEDTIME 30 tablet 0  . TRICOR 145 MG tablet Take 1 tablet (145 mg total) by mouth daily. 30 tablet 5   No current facility-administered medications for this visit.   Allergies:  Review of patient's allergies indicates no known allergies.   Social History: The patient  reports that he quit smoking about 28 years ago. His smoking use included Cigarettes. He has a 40 pack-year smoking history. He has never used smokeless tobacco. He reports that he does not drink alcohol or use illicit drugs.   ROS:  Please see the history of present illness. Otherwise, complete review of systems is positive for chronic dyspnea on exertion, typically NYHA class II.  All other systems are reviewed and negative.   Physical Exam: VS:  BP 112/88 mmHg  Pulse 68  Ht 5\' 10"  (1.778 m)  Wt 174 lb (78.926 kg)  BMI 24.97 kg/m2  SpO2 96%, BMI Body mass index is 24.97 kg/(m^2).  Wt Readings from Last 3 Encounters:  05/04/15 174 lb (78.926 kg)  04/25/15 173 lb 8 oz (78.699 kg)  04/18/15 174 lb (78.926 kg)    Appears comfortable.  HEENT: Conjunctiva and lids normal, oropharynx clear.  Neck: Supple, no elevated JVP or carotid bruits, no thyromegaly.  Lungs: Clear to auscultation, nonlabored breathing at rest.  Cardiac:  Regular rate and rhythm, no S3 or significant systolic murmur, no pericardial rub.  Abdomen: Soft, nontender, no hepatomegaly, bowel sounds present, no guarding or rebound.  Extremities: No pitting edema, distal pulses 2+.   ECG: Tracing from 09/02/2014 showed sinus bradycardia with low voltage.  Recent Labwork: 04/18/2015: ALT 28; AST 33; BUN 22; Creatinine, Ser 1.97*; Potassium 5.3*; Sodium 137     Component Value Date/Time   CHOL 133 04/18/2015 0852   CHOL 138 10/07/2014 0927   CHOL 177 09/25/2012 1015   TRIG 182* 04/18/2015 0852   TRIG 206* 10/07/2014 0927   TRIG 269* 09/25/2012 1015   HDL 21* 04/18/2015 0852   HDL 22* 10/07/2014 0927   HDL 28* 09/25/2012 1015   CHOLHDL 6.3* 04/18/2015 0852   LDLCALC 76 04/18/2015 0852   LDLCALC 98 12/21/2013 0850   LDLCALC 95 09/25/2012 1015    Other Studies Reviewed Today:  Edward [name] Cardiolite 09/13/2014:  This is a low risk study.  Findings consistent with prior myocardial infarction.  Defect 1: There is a medium defect of moderate severity present in the apical anterior and apex location  Assessment and Plan:  1. Symptomatically stable CAD status post BMS to the LAD in 2006 with residual distal RCA disease that has been managed medically. Recent Cardiolite study in May showed evidence of anterior wall scar without active ischemia. Plan to continue medical therapy and observation for now.  2. Hyperlipidemia, LDL 76 on Lipitor.  3. Essential hypertension, blood pressure is well controlled today.  Current medicines were reviewed with the patient today.  Disposition: FU with me in 6 months.   Signed, Satira Sark, MD, [facility] 05/04/2015 8:35 AM    [facility] [place], [place], [place] [address] Phone: [phone]; Fax: [phone]

## 2015-05-12 ENCOUNTER — Other Ambulatory Visit: Payer: Self-pay | Admitting: Nurse Practitioner

## 2015-05-12 NOTE — Telephone Encounter (Signed)
Seen 04/18/15

## 2015-05-25 ENCOUNTER — Ambulatory Visit: Payer: Self-pay | Admitting: Pharmacist

## 2015-07-07 ENCOUNTER — Other Ambulatory Visit: Payer: Self-pay | Admitting: Nurse Practitioner

## 2015-07-07 ENCOUNTER — Other Ambulatory Visit: Payer: Self-pay | Admitting: Pharmacist

## 2015-08-05 ENCOUNTER — Ambulatory Visit (INDEPENDENT_AMBULATORY_CARE_PROVIDER_SITE_OTHER): Payer: Medicaid Other | Admitting: Nurse Practitioner

## 2015-08-05 ENCOUNTER — Encounter: Payer: Self-pay | Admitting: Nurse Practitioner

## 2015-08-05 VITALS — BP 120/76 | HR 68 | Temp 97.3°F | Ht 70.0 in | Wt 169.0 lb

## 2015-08-05 DIAGNOSIS — G4700 Insomnia, unspecified: Secondary | ICD-10-CM

## 2015-08-05 DIAGNOSIS — E1159 Type 2 diabetes mellitus with other circulatory complications: Secondary | ICD-10-CM | POA: Diagnosis not present

## 2015-08-05 DIAGNOSIS — E782 Mixed hyperlipidemia: Secondary | ICD-10-CM | POA: Diagnosis not present

## 2015-08-05 DIAGNOSIS — I2510 Atherosclerotic heart disease of native coronary artery without angina pectoris: Secondary | ICD-10-CM

## 2015-08-05 DIAGNOSIS — I10 Essential (primary) hypertension: Secondary | ICD-10-CM

## 2015-08-05 DIAGNOSIS — N183 Chronic kidney disease, stage 3 unspecified: Secondary | ICD-10-CM

## 2015-08-05 DIAGNOSIS — E119 Type 2 diabetes mellitus without complications: Secondary | ICD-10-CM | POA: Insufficient documentation

## 2015-08-05 DIAGNOSIS — Z23 Encounter for immunization: Secondary | ICD-10-CM | POA: Diagnosis not present

## 2015-08-05 DIAGNOSIS — E1022 Type 1 diabetes mellitus with diabetic chronic kidney disease: Secondary | ICD-10-CM | POA: Insufficient documentation

## 2015-08-05 LAB — CMP14+EGFR
A/G RATIO: 1.8 (ref 1.2–2.2)
ALBUMIN: 4.3 g/dL (ref 3.6–4.8)
ALK PHOS: 35 IU/L — AB (ref 39–117)
ALT: 29 IU/L (ref 0–44)
AST: 35 IU/L (ref 0–40)
BILIRUBIN TOTAL: 0.5 mg/dL (ref 0.0–1.2)
BUN / CREAT RATIO: 12 (ref 10–22)
BUN: 24 mg/dL (ref 8–27)
CHLORIDE: 98 mmol/L (ref 96–106)
CO2: 19 mmol/L (ref 18–29)
Calcium: 9.2 mg/dL (ref 8.6–10.2)
Creatinine, Ser: 1.99 mg/dL — ABNORMAL HIGH (ref 0.76–1.27)
GFR calc Af Amer: 40 mL/min/{1.73_m2} — ABNORMAL LOW (ref >59)
GFR calc non Af Amer: 35 mL/min/{1.73_m2} — ABNORMAL LOW (ref >59)
GLOBULIN, TOTAL: 2.4 g/dL (ref 1.5–4.5)
GLUCOSE: 208 mg/dL — AB (ref 65–99)
POTASSIUM: 5 mmol/L (ref 3.5–5.2)
SODIUM: 136 mmol/L (ref 134–144)
Total Protein: 6.7 g/dL (ref 6.0–8.5)

## 2015-08-05 LAB — LIPID PANEL
CHOL/HDL RATIO: 4.9 ratio (ref 0.0–5.0)
Cholesterol, Total: 118 mg/dL (ref 100–199)
HDL: 24 mg/dL — ABNORMAL LOW (ref >39)
LDL Calculated: 62 mg/dL (ref 0–99)
Triglycerides: 160 mg/dL — ABNORMAL HIGH (ref 0–149)
VLDL CHOLESTEROL CAL: 32 mg/dL (ref 5–40)

## 2015-08-05 LAB — BAYER DCA HB A1C WAIVED: HB A1C (BAYER DCA - WAIVED): 7.9 % — ABNORMAL HIGH (ref <7.0)

## 2015-08-05 MED ORDER — SITAGLIPTIN PHOSPHATE 100 MG PO TABS
100.0000 mg | ORAL_TABLET | Freq: Every day | ORAL | Status: DC
Start: 2015-08-05 — End: 2015-12-01

## 2015-08-05 MED ORDER — GLIPIZIDE 10 MG PO TABS
10.0000 mg | ORAL_TABLET | Freq: Two times a day (BID) | ORAL | Status: DC
Start: 2015-08-05 — End: 2015-12-01

## 2015-08-05 NOTE — Addendum Note (Signed)
Addended by: Rolena Infante on: 08/05/2015 09:00 AM   Modules accepted: Orders

## 2015-08-05 NOTE — Patient Instructions (Signed)

## 2015-08-05 NOTE — Progress Notes (Signed)
Subjective:    Patient ID: Edward [name], male    DOB: Oct 25, 1950, 65 y.o.   MRN: [id number]  Patient here today for follow up of chronic medical problems. Started Tanzeum injections since last visit. Stopped Glimepiride, Glipizide and Januvia. States his stomach was hurting taking the Glimepiride so he stopped taking it. The doctor he saw that started him on the Tanzeum told him to stop the Glipizide and Januvia.  Current Outpatient Prescriptions on File Prior to Visit  Medication Sig Dispense Refill  . aspirin 81 MG tablet Take 81 mg by mouth daily.      [name] atorvastatin (LIPITOR) 40 MG tablet Take 1 tablet (40 mg total) by mouth daily. 90 tablet 1  . Blood Glucose Monitoring Suppl (ACCU-CHEK AVIVA) device Test blood sugar qd. Dx E11.59 1 each 0  . brimonidine (ALPHAGAN) 0.15 % ophthalmic solution Place 1 drop into both eyes 2 (two) times daily.    . Cholecalciferol (VITAMIN D3) 2000 UNITS TABS Take 1 tablet by mouth daily.    . fish oil-omega-3 fatty acids 1000 MG capsule Take 1 g by mouth 2 (two) times daily.      [name] glucose blood (ACCU-CHEK AVIVA) test strip Test blood sugar qd. DX E11.59 100 each 4  . lisinopril (PRINIVIL,ZESTRIL) 20 MG tablet Take 1 tablet (20 mg total) by mouth daily. 30 tablet 5  . metoprolol tartrate (LOPRESSOR) 25 MG tablet Take 1 tablet (25 mg total) by mouth 2 (two) times daily. 60 tablet 5  . nitroGLYCERIN (NITROSTAT) 0.4 MG SL tablet Place 1 tablet (0.4 mg total) under the tongue every 5 (five) minutes as needed. 25 tablet 2  . TANZEUM 30 MG PEN INJECT 30 MGS SUBCUTANEOUSLY ONCE A WEEK 4 each 0  . traZODone (DESYREL) 100 MG tablet TAKE ONE TABLET BY MOUTH AT BEDTIME 30 tablet 0  . TRICOR 145 MG tablet Take 1 tablet (145 mg total) by mouth daily. 30 tablet 5   No current facility-administered medications on file prior to visit.    Diabetes He has type 2 diabetes mellitus. No MedicAlert identification noted. His disease course has been fluctuating. Pertinent  negatives for hypoglycemia include no headaches. Pertinent negatives for diabetes include no chest pain, no fatigue, no polydipsia, no polyphagia and no visual change. Diabetic complications include heart disease. Risk factors for coronary artery disease include male sex, diabetes mellitus, dyslipidemia and hypertension. Current diabetic treatments: patient was changed to tanzeum by clinicalpharmacist but patietn does not like taking because afraid will cause cancer. When started on tanzeum they stopped glipizide and [place]- patient stopped glimepiride because he said was upsetting stomach. He is compliant with treatment most of the time. His weight is stable. When asked about meal planning, he reported none. He has not had a previous visit with a dietitian. He rarely participates in exercise. His home blood glucose trend is fluctuating minimally. His breakfast blood glucose is taken between 9-10 am. His breakfast blood glucose range is generally 130-140 mg/dl. His dinner blood glucose range is generally 140-180 mg/dl. His highest blood glucose is >200 mg/dl. His overall blood glucose range is 130-140 mg/dl. An ACE inhibitor/angiotensin II receptor blocker is being taken. He does not see a podiatrist.Eye exam is not current.  Hypertension This is a chronic problem. The current episode started more than 1 year ago. The problem is unchanged. The problem is controlled. Pertinent negatives include no chest pain, headaches, palpitations or shortness of breath. Past treatments include ACE inhibitors. The current treatment provides  moderate improvement. Compliance problems include diet and exercise.  Hypertensive end-organ damage includes CAD/MI.  Hyperlipidemia This is a chronic problem. The current episode started more than 1 year ago. The problem is controlled. Recent lipid tests were reviewed and are normal. Exacerbating diseases include diabetes. He has no history of hypothyroidism or obesity. Pertinent negatives  include no chest pain, myalgias or shortness of breath. Current antihyperlipidemic treatment includes statins and fibric acid derivatives. The current treatment provides moderate improvement of lipids. Compliance problems include adherence to exercise and adherence to diet.   CAD Metoprolol daily- had MI in 2006- sees cardiologist yearly- No c/o chest pain or SOB insomnia Patient takes trazadone nightly which helps him to sleep- feels rested in AM. CKD- stage 3 Does not see kidney specialist currently- will continue to monitor  Review of Systems  Constitutional: Negative.  Negative for fatigue.  HENT: Negative.   Eyes: Negative.        Double vision in one eye. Cataracts and glaucoma. Sees his eye doctor 2 times a year.  Respiratory: Negative.  Negative for shortness of breath.   Cardiovascular: Negative.  Negative for chest pain and palpitations.  Gastrointestinal: Negative.   Endocrine: Negative.  Negative for polydipsia and polyphagia.  Genitourinary: Negative.   Musculoskeletal: Negative.  Negative for myalgias.  Skin: Negative.   Allergic/Immunologic: Negative.   Neurological: Negative.  Negative for headaches.  Hematological: Negative.   Psychiatric/Behavioral: Negative.   All other systems reviewed and are negative.      Objective:   Physical Exam  Constitutional: He is oriented to person, place, and time. He appears well-developed and well-nourished.  HENT:  Head: Normocephalic.  Right Ear: External ear normal.  Left Ear: External ear normal.  Nose: Nose normal.  Mouth/Throat: Oropharynx is clear and moist.  Eyes: Conjunctivae and EOM are normal. Pupils are equal, round, and reactive to light.  Neck: Normal range of motion. Neck supple. No JVD present. No thyromegaly present.  Cardiovascular: Normal rate, regular rhythm, normal heart sounds and intact distal pulses.  Exam reveals no gallop and no friction rub.   No murmur heard. Pulmonary/Chest: Effort normal and  breath sounds normal. No respiratory distress. He has no wheezes. He has no rales. He exhibits no tenderness.  Abdominal: Soft. Bowel sounds are normal. He exhibits no mass. There is no tenderness.  Musculoskeletal: Normal range of motion. He exhibits no edema.  Lymphadenopathy:    He has no cervical adenopathy.  Neurological: He is alert and oriented to person, place, and time. No cranial nerve deficit.  Skin: Skin is warm and dry.  Psychiatric: He has a normal mood and affect. His behavior is normal. Judgment and thought content normal.    BP 120/76 mmHg  Pulse 68  Temp(Src) 97.3 F (36.3 C) (Oral)  Ht 5' 10"  (1.778 m)  Wt 169 lb (76.658 kg)  BMI 24.25 kg/m2  A1C 7.9 down from 7.2%   Assessment & Plan:   1. Type 2 diabetes mellitus with other circulatory complications (HCC) Stopped tanzem per patient request Started back on januvia and a higher dose of glipizide Patient does not want o go back on glimepiride STRICT CARB COUNTING - Bayer DCA Hb A1c Waived - sitaGLIPtin (JANUVIA) 100 MG tablet; Take 1 tablet (100 mg total) by mouth daily.  Dispense: 30 tablet; Refill: 5 - glipiZIDE (GLUCOTROL) 10 MG tablet; Take 1 tablet (10 mg total) by mouth 2 (two) times daily before a meal.  Dispense: 60 tablet; Refill: 5  2.  Mixed hyperlipidemia Low fat diet - Lipid panel  3. Essential hypertension Do not add slat to diet - CMP14+EGFR  4. Essential hypertension, benign Do ont add salt to diet  5. Type 2 diabetes mellitus with other circulatory complication (HCC) Discussed above  6. Insomnia Bedtime ritual  7. Atherosclerosis of native coronary artery of native heart without angina pectoris  8. CKD (chronic kidney disease) stage 3, GFR 30-59 ml/min Will continue to watch labs Discussed diabetes effect on kidney function and the improtance of getting hgba1c down    Labs pending Health maintenance reviewed Diet and exercise encouraged Continue all meds Follow up  In 3  months   [place], FNP

## 2015-08-09 ENCOUNTER — Other Ambulatory Visit: Payer: Self-pay | Admitting: Nurse Practitioner

## 2015-08-09 ENCOUNTER — Telehealth: Payer: Self-pay

## 2015-08-09 NOTE — Telephone Encounter (Signed)
Medicaid prior authorized [id number] x year

## 2015-08-10 NOTE — Telephone Encounter (Signed)
Last seen 08/05/15  MMM

## 2015-09-30 LAB — HM DIABETES EYE EXAM

## 2015-10-27 ENCOUNTER — Ambulatory Visit (INDEPENDENT_AMBULATORY_CARE_PROVIDER_SITE_OTHER): Payer: Medicaid Other | Admitting: Cardiology

## 2015-10-27 ENCOUNTER — Encounter: Payer: Self-pay | Admitting: Cardiology

## 2015-10-27 ENCOUNTER — Telehealth: Payer: Self-pay | Admitting: Nurse Practitioner

## 2015-10-27 VITALS — BP 107/67 | HR 60 | Ht 70.0 in | Wt 173.6 lb

## 2015-10-27 DIAGNOSIS — I25119 Atherosclerotic heart disease of native coronary artery with unspecified angina pectoris: Secondary | ICD-10-CM

## 2015-10-27 DIAGNOSIS — I2510 Atherosclerotic heart disease of native coronary artery without angina pectoris: Secondary | ICD-10-CM | POA: Diagnosis not present

## 2015-10-27 DIAGNOSIS — E782 Mixed hyperlipidemia: Secondary | ICD-10-CM | POA: Diagnosis not present

## 2015-10-27 DIAGNOSIS — I10 Essential (primary) hypertension: Secondary | ICD-10-CM

## 2015-10-27 MED ORDER — NITROGLYCERIN 0.4 MG SL SUBL: 0.4000 mg | SUBLINGUAL_TABLET | SUBLINGUAL | Status: DC | PRN

## 2015-10-27 MED ORDER — AMOXICILLIN-POT CLAVULANATE 875-125 MG PO TABS: 1.0000 | ORAL_TABLET | Freq: Two times a day (BID) | ORAL | Status: DC

## 2015-10-27 NOTE — Patient Instructions (Signed)

## 2015-10-27 NOTE — Telephone Encounter (Signed)
Patient aware.

## 2015-10-27 NOTE — Telephone Encounter (Signed)
Prescription sent to pharmacy. Pt needs to follow up with his dentist

## 2015-10-27 NOTE — Progress Notes (Signed)
Cardiology Office Note  Date: 10/27/2015   ID: Edward [name], DOB 04-Apr-1951, MRN [id number]  PCP: [name], FNP  Primary Cardiologist: Rozann Lesches, MD   Chief Complaint  Patient presents with  . Coronary Artery Disease    History of Present Illness: Edward [name] is a 65 y.o. male last seen in December 2016. He is here today with his wife for a follow-up visit. Reports occasional angina symptoms, but has not required any nitroglycerin. NYHA class II dyspnea with typical activities.  We went over his medications. No changes from a cardiac perspective. He continues on aspirin, Lipitor, lisinopril, Lopressor, and has as needed nitroglycerin.  I reviewed his recent lab work from March. LDL 62. I reviewed his ECG today which shows sinus bradycardia with poor R-wave progression and low voltage.  Past Medical History  Diagnosis Date  . Coronary atherosclerosis of native coronary artery     BMS LAD 2006; residual 75% distal RCA; EF 50%  . Type 2 diabetes mellitus (Edward [name])   . Essential hypertension, benign   . Mixed hyperlipidemia   . Low back pain   . MI (myocardial infarction) (Edward [name])     Anterior 2006  . CKD (chronic kidney disease) stage 3, GFR 30-59 ml/min   . Cataract   . Glaucoma     Current Outpatient Prescriptions  Medication Sig Dispense Refill  . aspirin 81 MG tablet Take 81 mg by mouth daily.      [name] atorvastatin (LIPITOR) 40 MG tablet Take 1 tablet (40 mg total) by mouth daily. 90 tablet 1  . Blood Glucose Monitoring Suppl (ACCU-CHEK AVIVA) device Test blood sugar qd. Dx E11.59 1 each 0  . brimonidine (ALPHAGAN) 0.15 % ophthalmic solution Place 1 drop into both eyes 2 (two) times daily.    . Cholecalciferol (VITAMIN D3) 2000 UNITS TABS Take 1 tablet by mouth daily.    . fish oil-omega-3 fatty acids 1000 MG capsule Take 1 g by mouth 2 (two) times daily.      [name] glipiZIDE (GLUCOTROL) 10 MG tablet Take 1 tablet (10 mg total) by mouth 2 (two) times daily  before a meal. 60 tablet 5  . glucose blood (ACCU-CHEK AVIVA) test strip Test blood sugar qd. DX E11.59 100 each 4  . lisinopril (PRINIVIL,ZESTRIL) 20 MG tablet Take 1 tablet (20 mg total) by mouth daily. 30 tablet 5  . metoprolol tartrate (LOPRESSOR) 25 MG tablet Take 1 tablet (25 mg total) by mouth 2 (two) times daily. 60 tablet 5  . nitroGLYCERIN (NITROSTAT) 0.4 MG SL tablet Place 1 tablet (0.4 mg total) under the tongue every 5 (five) minutes as needed. 25 tablet 2  . sitaGLIPtin (JANUVIA) 100 MG tablet Take 1 tablet (100 mg total) by mouth daily. 30 tablet 5  . traZODone (DESYREL) 100 MG tablet TAKE ONE TABLET BY MOUTH AT BEDTIME 30 tablet 2  . TRICOR 145 MG tablet Take 1 tablet (145 mg total) by mouth daily. 30 tablet 5   No current facility-administered medications for this visit.   Allergies:  Review of patient's allergies indicates no known allergies.   Social History: The patient  reports that he quit smoking about 28 years ago. His smoking use included Cigarettes. He has a 40 pack-year smoking history. He has never used smokeless tobacco. He reports that he does not drink alcohol or use illicit drugs.   ROS:  Please see the history of present illness. Otherwise, complete review of systems is positive for none.  All other systems are reviewed and negative.   Physical Exam: VS:  BP 107/67 mmHg  Pulse 60  Ht 5\' 10"  (1.778 m)  Wt 173 lb 9.6 oz (78.744 kg)  BMI 24.91 kg/m2  SpO2 96%, BMI Body mass index is 24.91 kg/(m^2).  Wt Readings from Last 3 Encounters:  10/27/15 173 lb 9.6 oz (78.744 kg)  08/05/15 169 lb (76.658 kg)  05/04/15 174 lb (78.926 kg)    Appears comfortable.  HEENT: Conjunctiva and lids normal, oropharynx clear.  Neck: Supple, no elevated JVP or carotid bruits, no thyromegaly.  Lungs: Clear to auscultation, nonlabored breathing at rest.  Cardiac: Regular rate and rhythm, no S3 or significant systolic murmur, no pericardial rub.  Abdomen: Soft, nontender,  no hepatomegaly, bowel sounds present, no guarding or rebound.  Extremities: No pitting edema, distal pulses 2+.  ECG: I personally reviewed the tracing from 09/02/2014 which showed sinus bradycardia with low voltage.  Recent Labwork: 08/05/2015: ALT 29; AST 35; BUN 24; Creatinine, Ser 1.99*; Potassium 5.0; Sodium 136     Component Value Date/Time   CHOL 118 08/05/2015 0805   CHOL 138 10/07/2014 0927   CHOL 177 09/25/2012 1015   TRIG 160* 08/05/2015 0805   TRIG 206* 10/07/2014 0927   TRIG 269* 09/25/2012 1015   HDL 24* 08/05/2015 0805   HDL 22* 10/07/2014 0927   HDL 28* 09/25/2012 1015   CHOLHDL 4.9 08/05/2015 0805   LDLCALC 62 08/05/2015 0805   LDLCALC 98 12/21/2013 0850   LDLCALC 95 09/25/2012 1015    Other Studies Reviewed Today:  [facility] Adam Cardiolite 09/13/2014:  This is a low risk study.  Findings consistent with prior myocardial infarction.  Defect 1: There is a medium defect of moderate severity present in the apical anterior and apex location  Assessment and Plan:  1. Symptomatically stable CAD status post BMS to the LAD in 2006 with residual distal RCA disease that has been managed medically. Cardiolite from last year was overall low risk. He does not report any progressive angina and we will plan to continue medical therapy and observation.  2. Hyperlipidemia on Lipitor, recent LDL 62.  3. Essential hypertension, blood pressure is well controlled today.  Current medicines were reviewed with the patient today.   Orders Placed This Encounter  Procedures  . EKG 12-Lead    Disposition: Follow-up with me in 6 months.  Signed, Satira Sark, MD, [facility] 10/27/2015 9:03 AM    [facility] [place], [place], [place] [address] Phone: [phone]; Fax: [phone]

## 2015-11-10 ENCOUNTER — Other Ambulatory Visit: Payer: Self-pay | Admitting: Nurse Practitioner

## 2015-12-01 ENCOUNTER — Ambulatory Visit (INDEPENDENT_AMBULATORY_CARE_PROVIDER_SITE_OTHER): Payer: Medicaid Other | Admitting: Nurse Practitioner

## 2015-12-01 ENCOUNTER — Encounter: Payer: Self-pay | Admitting: Nurse Practitioner

## 2015-12-01 VITALS — BP 105/69 | HR 48 | Temp 97.2°F | Ht 70.0 in | Wt 173.0 lb

## 2015-12-01 DIAGNOSIS — I25119 Atherosclerotic heart disease of native coronary artery with unspecified angina pectoris: Secondary | ICD-10-CM | POA: Diagnosis not present

## 2015-12-01 DIAGNOSIS — E1159 Type 2 diabetes mellitus with other circulatory complications: Secondary | ICD-10-CM | POA: Diagnosis not present

## 2015-12-01 DIAGNOSIS — G4700 Insomnia, unspecified: Secondary | ICD-10-CM

## 2015-12-01 DIAGNOSIS — N183 Chronic kidney disease, stage 3 unspecified: Secondary | ICD-10-CM

## 2015-12-01 DIAGNOSIS — I10 Essential (primary) hypertension: Secondary | ICD-10-CM | POA: Diagnosis not present

## 2015-12-01 DIAGNOSIS — E782 Mixed hyperlipidemia: Secondary | ICD-10-CM

## 2015-12-01 LAB — CMP14+EGFR
ALBUMIN: 4.4 g/dL (ref 3.6–4.8)
ALK PHOS: 35 IU/L — AB (ref 39–117)
ALT: 32 IU/L (ref 0–44)
AST: 33 IU/L (ref 0–40)
Albumin/Globulin Ratio: 1.7 (ref 1.2–2.2)
BILIRUBIN TOTAL: 0.5 mg/dL (ref 0.0–1.2)
BUN / CREAT RATIO: 11 (ref 10–24)
BUN: 23 mg/dL (ref 8–27)
CHLORIDE: 95 mmol/L — AB (ref 96–106)
CO2: 22 mmol/L (ref 18–29)
Calcium: 9.6 mg/dL (ref 8.6–10.2)
Creatinine, Ser: 2.05 mg/dL — ABNORMAL HIGH (ref 0.76–1.27)
GFR calc Af Amer: 39 mL/min/{1.73_m2} — ABNORMAL LOW (ref >59)
GFR calc non Af Amer: 33 mL/min/{1.73_m2} — ABNORMAL LOW (ref >59)
GLUCOSE: 181 mg/dL — AB (ref 65–99)
Globulin, Total: 2.6 g/dL (ref 1.5–4.5)
Potassium: 5.3 mmol/L — ABNORMAL HIGH (ref 3.5–5.2)
Sodium: 134 mmol/L (ref 134–144)
Total Protein: 7 g/dL (ref 6.0–8.5)

## 2015-12-01 LAB — LIPID PANEL
CHOLESTEROL TOTAL: 140 mg/dL (ref 100–199)
Chol/HDL Ratio: 5.4 ratio units — ABNORMAL HIGH (ref 0.0–5.0)
HDL: 26 mg/dL — ABNORMAL LOW (ref >39)
LDL Calculated: 76 mg/dL (ref 0–99)
TRIGLYCERIDES: 192 mg/dL — AB (ref 0–149)
VLDL CHOLESTEROL CAL: 38 mg/dL (ref 5–40)

## 2015-12-01 LAB — BAYER DCA HB A1C WAIVED: HB A1C (BAYER DCA - WAIVED): 9.2 % — ABNORMAL HIGH (ref <7.0)

## 2015-12-01 MED ORDER — METFORMIN HCL 500 MG PO TABS: 500.0000 mg | ORAL_TABLET | Freq: Two times a day (BID) | ORAL | 3 refills | Status: DC

## 2015-12-01 MED ORDER — METOPROLOL TARTRATE 25 MG PO TABS: 25.0000 mg | ORAL_TABLET | Freq: Two times a day (BID) | ORAL | 5 refills | Status: DC

## 2015-12-01 MED ORDER — GLIPIZIDE 10 MG PO TABS: 10.0000 mg | ORAL_TABLET | Freq: Two times a day (BID) | ORAL | 5 refills | Status: DC

## 2015-12-01 MED ORDER — TRICOR 145 MG PO TABS: 145.0000 mg | ORAL_TABLET | Freq: Every day | ORAL | 5 refills | Status: DC

## 2015-12-01 MED ORDER — ATORVASTATIN CALCIUM 40 MG PO TABS: 40.0000 mg | ORAL_TABLET | Freq: Every day | ORAL | 1 refills | Status: DC

## 2015-12-01 MED ORDER — TRAZODONE HCL 100 MG PO TABS: 100.0000 mg | ORAL_TABLET | Freq: Every day | ORAL | 5 refills | Status: DC

## 2015-12-01 MED ORDER — LISINOPRIL 20 MG PO TABS: 20.0000 mg | ORAL_TABLET | Freq: Every day | ORAL | 5 refills | Status: DC

## 2015-12-01 MED ORDER — SITAGLIPTIN PHOSPHATE 100 MG PO TABS: 100.0000 mg | ORAL_TABLET | Freq: Every day | ORAL | 5 refills | Status: DC

## 2015-12-01 NOTE — Progress Notes (Signed)
Subjective:    Patient ID: Edward [name], male    DOB: 28-Mar-1951, 65 y.o.   MRN: [id number]  Patient here today for follow up of chronic medical problems.   Current Outpatient Prescriptions on File Prior to Visit  Medication Sig Dispense Refill  . aspirin 81 MG tablet Take 81 mg by mouth daily.      [name] atorvastatin (LIPITOR) 40 MG tablet Take 1 tablet (40 mg total) by mouth daily. 90 tablet 1  . Blood Glucose Monitoring Suppl (ACCU-CHEK AVIVA) device Test blood sugar qd. Dx E11.59 1 each 0  . brimonidine (ALPHAGAN) 0.15 % ophthalmic solution Place 1 drop into both eyes 2 (two) times daily.    . Cholecalciferol (VITAMIN D3) 2000 UNITS TABS Take 1 tablet by mouth daily.    . fish oil-omega-3 fatty acids 1000 MG capsule Take 1 g by mouth 2 (two) times daily.      [name] glipiZIDE (GLUCOTROL) 10 MG tablet Take 1 tablet (10 mg total) by mouth 2 (two) times daily before a meal. 60 tablet 5  . glucose blood (ACCU-CHEK AVIVA) test strip Test blood sugar qd. DX E11.59 100 each 4  . lisinopril (PRINIVIL,ZESTRIL) 20 MG tablet Take 1 tablet (20 mg total) by mouth daily. 30 tablet 5  . metoprolol tartrate (LOPRESSOR) 25 MG tablet Take 1 tablet (25 mg total) by mouth 2 (two) times daily. 60 tablet 5  . nitroGLYCERIN (NITROSTAT) 0.4 MG SL tablet Place 1 tablet (0.4 mg total) under the tongue every 5 (five) minutes as needed. 25 tablet 3  . sitaGLIPtin (JANUVIA) 100 MG tablet Take 1 tablet (100 mg total) by mouth daily. 30 tablet 5  . traZODone (DESYREL) 100 MG tablet TAKE ONE TABLET BY MOUTH AT BEDTIME 30 tablet 0  . TRICOR 145 MG tablet Take 1 tablet (145 mg total) by mouth daily. 30 tablet 5   No current facility-administered medications on file prior to visit.     Diabetes  He has type 2 diabetes mellitus. No MedicAlert identification noted. His disease course has been fluctuating. Pertinent negatives for hypoglycemia include no headaches. Pertinent negatives for diabetes include no chest pain, no  fatigue, no polydipsia, no polyphagia and no visual change. Diabetic complications include heart disease. Risk factors for coronary artery disease include male sex, diabetes mellitus, dyslipidemia and hypertension. Current diabetic treatment includes oral agent (dual therapy) (was on tanzem at l;ast visit but was making him feel bad so we stopped it. Currently just on januvia and glipizide.). He is compliant with treatment most of the time. His weight is stable. When asked about meal planning, he reported none. He has not had a previous visit with a dietitian. He rarely participates in exercise. His home blood glucose trend is fluctuating minimally. His breakfast blood glucose is taken between 9-10 am. His breakfast blood glucose range is generally 140-180 mg/dl. His dinner blood glucose range is generally 140-180 mg/dl. His highest blood glucose is >200 mg/dl. His overall blood glucose range is 140-180 mg/dl. An ACE inhibitor/angiotensin II receptor blocker is being taken. He does not see a podiatrist.Eye exam is not current.  Hypertension  This is a chronic problem. The current episode started more than 1 year ago. The problem is unchanged. The problem is controlled. Pertinent negatives include no chest pain, headaches, palpitations or shortness of breath. Past treatments include ACE inhibitors. The current treatment provides moderate improvement. Compliance problems include diet and exercise.  Hypertensive end-organ damage includes CAD/MI.  Hyperlipidemia  This is  a chronic problem. The current episode started more than 1 year ago. The problem is controlled. Recent lipid tests were reviewed and are normal. Exacerbating diseases include diabetes. He has no history of hypothyroidism or obesity. Pertinent negatives include no chest pain, myalgias or shortness of breath. Current antihyperlipidemic treatment includes statins and fibric acid derivatives. The current treatment provides moderate improvement of  lipids. Compliance problems include adherence to exercise and adherence to diet.   CAD Metoprolol daily- had MI in 2006- sees cardiologist yearly- No c/o chest pain or SOB insomnia Patient takes trazadone nightly which helps him to sleep- feels rested in AM. CKD- stage 3 Does not see kidney specialist currently- will continue to monitor  Review of Systems  Constitutional: Negative.  Negative for fatigue.  HENT: Negative.   Eyes: Negative.        Double vision in one eye. Cataracts and glaucoma. Saw eye doctor 6 weeks ago but cannot remember name to get report.  Respiratory: Negative.  Negative for shortness of breath.   Cardiovascular: Negative.  Negative for chest pain and palpitations.  Gastrointestinal: Negative.   Endocrine: Negative.  Negative for polydipsia and polyphagia.  Genitourinary: Negative.   Musculoskeletal: Negative.  Negative for myalgias.  Skin: Negative.   Allergic/Immunologic: Negative.   Neurological: Negative.  Negative for headaches.  Hematological: Negative.   Psychiatric/Behavioral: Negative.   All other systems reviewed and are negative.      Objective:   Physical Exam  Constitutional: He is oriented to person, place, and time. He appears well-developed and well-nourished.  HENT:  Head: Normocephalic.  Right Ear: External ear normal.  Left Ear: External ear normal.  Nose: Nose normal.  Mouth/Throat: Oropharynx is clear and moist.  Eyes: Conjunctivae and EOM are normal. Pupils are equal, round, and reactive to light.  Neck: Normal range of motion. Neck supple. No JVD present. No thyromegaly present.  Cardiovascular: Normal rate, regular rhythm, normal heart sounds and intact distal pulses.  Exam reveals no gallop and no friction rub.   No murmur heard. Pulmonary/Chest: Effort normal and breath sounds normal. No respiratory distress. He has no wheezes. He has no rales. He exhibits no tenderness.  Abdominal: Soft. Bowel sounds are normal. He exhibits  no mass. There is no tenderness.  Musculoskeletal: Normal range of motion. He exhibits no edema.  Lymphadenopathy:    He has no cervical adenopathy.  Neurological: He is alert and oriented to person, place, and time. No cranial nerve deficit.  Skin: Skin is warm and dry.  Psychiatric: He has a normal mood and affect. His behavior is normal. Judgment and thought content normal.    BP 105/69   Pulse (!) 48   Temp 97.2 F (36.2 C) (Oral)   Ht 5' 10"  (1.778 m)   Wt 173 lb (78.5 kg)   BMI 24.82 kg/m   HGBA1c 9.2%   Assessment & Plan:  1. Type 2 diabetes mellitus with other circulatory complications (HCC) Strict carb counting - Bayer DCA Hb A1c Waived Added metformin back to meds he will take 59m BID - glipiZIDE (GLUCOTROL) 10 MG tablet; Take 1 tablet (10 mg total) by mouth 2 (two) times daily before a meal.  Dispense: 60 tablet; Refill: 5 - sitaGLIPtin (JANUVIA) 100 MG tablet; Take 1 tablet (100 mg total) by mouth daily.  Dispense: 30 tablet; Refill: 5  2. Mixed hyperlipidemia Low fat diet - Lipid panel - TRICOR 145 MG tablet; Take 1 tablet (145 mg total) by mouth daily.  Dispense: 30  tablet; Refill: 5 - atorvastatin (LIPITOR) 40 MG tablet; Take 1 tablet (40 mg total) by mouth daily.  Dispense: 90 tablet; Refill: 1  3. Essential hypertension Do not add salt to diet - CMP14+EGFR - lisinopril (PRINIVIL,ZESTRIL) 20 MG tablet; Take 1 tablet (20 mg total) by mouth daily.  Dispense: 30 tablet; Refill: 5 - metoprolol tartrate (LOPRESSOR) 25 MG tablet; Take 1 tablet (25 mg total) by mouth 2 (two) times daily.  Dispense: 60 tablet; Refill: 5  4. Atherosclerosis of native coronary artery of native heart with angina pectoris ([place])  5. CKD (chronic kidney disease) stage 3, GFR 30-59 ml/min Will continue to watch labs  6. Insomnia Bedtime routine - traZODone (DESYREL) 100 MG tablet; Take 1 tablet (100 mg total) by mouth at bedtime.  Dispense: 30 tablet; Refill: 5    Labs  pending Health maintenance reviewed Diet and exercise encouraged Continue all meds Follow up  In 1 months   [place], FNP

## 2015-12-01 NOTE — Patient Instructions (Signed)

## 2016-01-02 ENCOUNTER — Ambulatory Visit: Payer: Medicaid Other | Admitting: Nurse Practitioner

## 2016-01-03 ENCOUNTER — Encounter: Payer: Self-pay | Admitting: Nurse Practitioner

## 2016-01-03 ENCOUNTER — Ambulatory Visit (INDEPENDENT_AMBULATORY_CARE_PROVIDER_SITE_OTHER): Payer: Medicaid Other | Admitting: Nurse Practitioner

## 2016-01-03 VITALS — BP 103/69 | HR 65 | Temp 97.0°F | Ht 70.0 in | Wt 173.0 lb

## 2016-01-03 DIAGNOSIS — E1159 Type 2 diabetes mellitus with other circulatory complications: Secondary | ICD-10-CM

## 2016-01-03 DIAGNOSIS — N183 Chronic kidney disease, stage 3 unspecified: Secondary | ICD-10-CM

## 2016-01-03 LAB — BAYER DCA HB A1C WAIVED: HB A1C (BAYER DCA - WAIVED): 7.9 % — ABNORMAL HIGH (ref <7.0)

## 2016-01-03 NOTE — Progress Notes (Signed)
   Subjective:    Patient ID: Edward [name], male    DOB: 14-Apr-1951, 65 y.o.   MRN: [id number]  HPI Patient in today for follow up of diabetes. He was seen 12/01/15 with Hgba1c of 9.2%- he had stopped taking his tanzem. He was put back on his glipizide,  As well as his metformin that he was on. [place]. His blood sugars have been running in low 100's. He only checks it in the mornings. He says that he has been trying to watch his diet.    Review of Systems  Constitutional: Negative.   HENT: Negative.   Respiratory: Negative.   Cardiovascular: Negative.   Genitourinary: Negative.   Neurological: Negative.   Psychiatric/Behavioral: Negative.   All other systems reviewed and are negative.      Objective:   Physical Exam  Constitutional: He is oriented to person, place, and time. He appears well-developed and well-nourished.  Cardiovascular: Normal rate, regular rhythm and normal heart sounds.   Pulmonary/Chest: Effort normal.  Neurological: He is alert and oriented to person, place, and time.  Skin: Skin is warm.  Psychiatric: He has a normal mood and affect. His behavior is normal. Judgment and thought content normal.   BP 103/69   Pulse 65   Temp 97 F (36.1 C) (Oral)   Ht 5\' 10"  (1.778 m)   Wt 173 lb (78.5 kg)   BMI 24.82 kg/m    hgba1c 7.9% down from 9.6% at last visit     Assessment & Plan:   1. Type 2 diabetes mellitus with other circulatory complications ([place])   2. CKD (chronic kidney disease) stage 3, GFR 30-59 ml/min    Continue all meds  Rechecking creatine today and may need to decrease januvia dse if still over 2 RTO in 2 months follow up  [place], FNP

## 2016-01-04 LAB — BMP8+EGFR
BUN/Creatinine Ratio: 15 (ref 10–24)
BUN: 35 mg/dL — AB (ref 8–27)
CALCIUM: 9.6 mg/dL (ref 8.6–10.2)
CHLORIDE: 100 mmol/L (ref 96–106)
CO2: 22 mmol/L (ref 18–29)
Creatinine, Ser: 2.33 mg/dL — ABNORMAL HIGH (ref 0.76–1.27)
GFR calc non Af Amer: 29 mL/min/{1.73_m2} — ABNORMAL LOW (ref >59)
GFR, EST AFRICAN AMERICAN: 33 mL/min/{1.73_m2} — AB (ref >59)
GLUCOSE: 114 mg/dL — AB (ref 65–99)
POTASSIUM: 5.2 mmol/L (ref 3.5–5.2)
Sodium: 136 mmol/L (ref 134–144)

## 2016-01-05 ENCOUNTER — Other Ambulatory Visit: Payer: Self-pay | Admitting: Nurse Practitioner

## 2016-01-05 DIAGNOSIS — N183 Chronic kidney disease, stage 3 unspecified: Secondary | ICD-10-CM

## 2016-01-06 ENCOUNTER — Telehealth: Payer: Self-pay | Admitting: Nurse Practitioner

## 2016-01-25 IMAGING — NM NM MYOCAR MULTI W/SPECT W/WALL MOTION & EF
2 series · 12 of 12 positions shown · non-contrast
Comparison: none

[Series 1: rest · 8.28mm/px · 6 of 64 frames shown]
[frame 6/64]
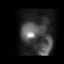
[frame 16/64]
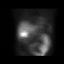
[frame 27/64]
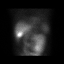
[frame 38/64]
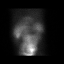
[frame 48/64]
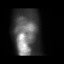
[frame 59/64]
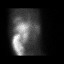

[Series 2: stress gated · 8.28mm/px · 6 of 64 frames shown]
[frame 6/64]
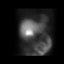
[frame 16/64]
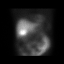
[frame 27/64]
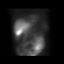
[frame 38/64]
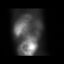
[frame 48/64]
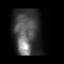
[frame 59/64]
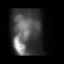

[12 of 12 positions shown; findings below may reference images not displayed]

This is a summary report. The complete report is available in the patient's medical record. If you cannot access the medical record, please contact the sending organization for a detailed fax or copy.

? This is a low risk study.
? Findings consistent with prior myocardial infarction.
? Defect 1: There is a medium defect of moderate severity present in the apical anterior and apex location.

10
Tc Cardiolite Rest 10 mCi, Tc Cardiolite Stress 30 mCi, Lexiscan, Cardiology to read

## 2016-01-30 ENCOUNTER — Other Ambulatory Visit (HOSPITAL_COMMUNITY): Payer: Self-pay | Admitting: Nephrology

## 2016-01-30 DIAGNOSIS — N183 Chronic kidney disease, stage 3 unspecified: Secondary | ICD-10-CM

## 2016-02-07 ENCOUNTER — Ambulatory Visit (HOSPITAL_COMMUNITY)
Admission: RE | Admit: 2016-02-07 | Discharge: 2016-02-07 | Disposition: A | Discharge status: Home / Self Care | Payer: Medicaid Other | Source: Ambulatory Visit | Attending: Nephrology | Admitting: Nephrology

## 2016-02-07 DIAGNOSIS — N183 Chronic kidney disease, stage 3 unspecified: Secondary | ICD-10-CM

## 2016-03-05 ENCOUNTER — Encounter: Payer: Self-pay | Admitting: Nurse Practitioner

## 2016-03-05 ENCOUNTER — Ambulatory Visit (INDEPENDENT_AMBULATORY_CARE_PROVIDER_SITE_OTHER): Payer: Medicaid Other | Admitting: Nurse Practitioner

## 2016-03-05 VITALS — BP 113/76 | HR 62 | Temp 98.3°F | Ht 70.0 in | Wt 168.0 lb

## 2016-03-05 DIAGNOSIS — I25119 Atherosclerotic heart disease of native coronary artery with unspecified angina pectoris: Secondary | ICD-10-CM

## 2016-03-05 DIAGNOSIS — F5101 Primary insomnia: Secondary | ICD-10-CM | POA: Diagnosis not present

## 2016-03-05 DIAGNOSIS — E1122 Type 2 diabetes mellitus with diabetic chronic kidney disease: Secondary | ICD-10-CM

## 2016-03-05 DIAGNOSIS — N183 Chronic kidney disease, stage 3 unspecified: Secondary | ICD-10-CM

## 2016-03-05 DIAGNOSIS — E782 Mixed hyperlipidemia: Secondary | ICD-10-CM | POA: Diagnosis not present

## 2016-03-05 DIAGNOSIS — I10 Essential (primary) hypertension: Secondary | ICD-10-CM

## 2016-03-05 DIAGNOSIS — Z23 Encounter for immunization: Secondary | ICD-10-CM

## 2016-03-05 LAB — BAYER DCA HB A1C WAIVED: HB A1C: 6.4 % (ref <7.0)

## 2016-03-05 NOTE — Progress Notes (Signed)
Subjective:    Patient ID: Edward [name], male    DOB: 1951/04/17, 65 y.o.   MRN: [id number]  Patient here today for follow up of chronic medical problems.   Current Outpatient Prescriptions on File Prior to Visit  Medication Sig Dispense Refill  . aspirin 81 MG tablet Take 81 mg by mouth daily.      [name] atorvastatin (LIPITOR) 40 MG tablet Take 1 tablet (40 mg total) by mouth daily. 90 tablet 1  . Blood Glucose Monitoring Suppl (ACCU-CHEK AVIVA) device Test blood sugar qd. Dx E11.59 1 each 0  . brimonidine (ALPHAGAN) 0.15 % ophthalmic solution Place 1 drop into both eyes 2 (two) times daily.    . Cholecalciferol (VITAMIN D3) 2000 UNITS TABS Take 1 tablet by mouth daily.    . fish oil-omega-3 fatty acids 1000 MG capsule Take 1 g by mouth 2 (two) times daily.      [name] glipiZIDE (GLUCOTROL) 10 MG tablet Take 1 tablet (10 mg total) by mouth 2 (two) times daily before a meal. 60 tablet 5  . glucose blood (ACCU-CHEK AVIVA) test strip Test blood sugar qd. DX E11.59 100 each 4  . lisinopril (PRINIVIL,ZESTRIL) 20 MG tablet Take 1 tablet (20 mg total) by mouth daily. 30 tablet 5  . metFORMIN (GLUCOPHAGE) 500 MG tablet Take 1 tablet (500 mg total) by mouth 2 (two) times daily with a meal. 180 tablet 3  . metoprolol tartrate (LOPRESSOR) 25 MG tablet Take 1 tablet (25 mg total) by mouth 2 (two) times daily. 60 tablet 5  . nitroGLYCERIN (NITROSTAT) 0.4 MG SL tablet Place 1 tablet (0.4 mg total) under the tongue every 5 (five) minutes as needed. 25 tablet 3  . sitaGLIPtin (JANUVIA) 100 MG tablet Take 1 tablet (100 mg total) by mouth daily. 30 tablet 5  . traZODone (DESYREL) 100 MG tablet Take 1 tablet (100 mg total) by mouth at bedtime. 30 tablet 5  . TRICOR 145 MG tablet Take 1 tablet (145 mg total) by mouth daily. 30 tablet 5   No current facility-administered medications on file prior to visit.     Diabetes  He has type 2 diabetes mellitus. No MedicAlert identification noted. His disease course has been  fluctuating. Pertinent negatives for hypoglycemia include no headaches. Pertinent negatives for diabetes include no chest pain, no fatigue, no polydipsia, no polyphagia and no visual change. Diabetic complications include heart disease. Risk factors for coronary artery disease include male sex, diabetes mellitus, dyslipidemia and hypertension. Current diabetic treatment includes oral agent (dual therapy) (was on tanzem at l;ast visit but was making him feel bad so we stopped it. Currently just on januvia and glipizide.). He is compliant with treatment most of the time. His weight is stable. When asked about meal planning, he reported none. He has not had a previous visit with a dietitian. He rarely participates in exercise. His home blood glucose trend is fluctuating minimally. His breakfast blood glucose is taken between 9-10 am. His breakfast blood glucose range is generally 90-110 mg/dl. His dinner blood glucose range is generally 130-140 mg/dl. His highest blood glucose is 180-200 mg/dl. His overall blood glucose range is 140-180 mg/dl. An ACE inhibitor/angiotensin II receptor blocker is being taken. He does not see a podiatrist.Eye exam is not current.  Hypertension  This is a chronic problem. The current episode started more than 1 year ago. The problem is unchanged. The problem is controlled. Pertinent negatives include no chest pain, headaches, palpitations or shortness of breath.  Past treatments include ACE inhibitors. The current treatment provides moderate improvement. Compliance problems include diet and exercise.  Hypertensive end-organ damage includes CAD/MI.  Hyperlipidemia  This is a chronic problem. The current episode started more than 1 year ago. The problem is controlled. Recent lipid tests were reviewed and are normal. Exacerbating diseases include diabetes. He has no history of hypothyroidism or obesity. Pertinent negatives include no chest pain, myalgias or shortness of breath. Current  antihyperlipidemic treatment includes statins and fibric acid derivatives. The current treatment provides moderate improvement of lipids. Compliance problems include adherence to exercise and adherence to diet.   CAD Metoprolol daily- had MI in 2006- sees cardiologist yearly- No c/o chest pain or SOB insomnia Patient takes trazadone nightly which helps him to sleep- feels rested in AM. CKD- stage 3 Does not see kidney specialist currently- will continue to monitor  Review of Systems  Constitutional: Negative.  Negative for fatigue.  HENT: Negative.   Eyes: Negative.        Double vision in one eye. Cataracts and glaucoma. Saw eye doctor 6 weeks ago but cannot remember name to get report.  Respiratory: Negative.  Negative for shortness of breath.   Cardiovascular: Negative.  Negative for chest pain and palpitations.  Gastrointestinal: Negative.   Endocrine: Negative.  Negative for polydipsia and polyphagia.  Genitourinary: Negative.   Musculoskeletal: Negative.  Negative for myalgias.  Skin: Negative.   Allergic/Immunologic: Negative.   Neurological: Negative.  Negative for headaches.  Hematological: Negative.   Psychiatric/Behavioral: Negative.   All other systems reviewed and are negative.      Objective:   Physical Exam  Constitutional: He is oriented to person, place, and time. He appears well-developed and well-nourished.  HENT:  Head: Normocephalic.  Right Ear: External ear normal.  Left Ear: External ear normal.  Nose: Nose normal.  Mouth/Throat: Oropharynx is clear and moist.  Eyes: Conjunctivae and EOM are normal. Pupils are equal, round, and reactive to light.  Neck: Normal range of motion. Neck supple. No JVD present. No thyromegaly present.  Cardiovascular: Normal rate, regular rhythm, normal heart sounds and intact distal pulses.  Exam reveals no gallop and no friction rub.   No murmur heard. Pulmonary/Chest: Effort normal and breath sounds normal. No respiratory  distress. He has no wheezes. He has no rales. He exhibits no tenderness.  Abdominal: Soft. Bowel sounds are normal. He exhibits no mass. There is no tenderness.  Musculoskeletal: Normal range of motion. He exhibits no edema.  Lymphadenopathy:    He has no cervical adenopathy.  Neurological: He is alert and oriented to person, place, and time. No cranial nerve deficit.  Skin: Skin is warm and dry.  Psychiatric: He has a normal mood and affect. His behavior is normal. Judgment and thought content normal.    BP 113/76   Pulse 62   Temp 98.3 F (36.8 C) (Oral)   Ht 5' 10"  (1.778 m)   Wt 168 lb (76.2 kg)   BMI 24.11 kg/m   HGBA1c 6.4% down from 7.9%  Assessment & Plan:  1. Mixed hyperlipidemia Watch fat in diet - Lipid panel  2. Essential hypertension Low sodium diet - CMP14+EGFR  3. Atherosclerosis of native coronary artery of native heart with angina pectoris ([place])  4. Type 2 diabetes mellitus with stage 3 chronic kidney disease, without long-term current use of insulin (HCC) Continue to watch carbs in diet - Bayer DCA Hb A1c Waived  5. CKD (chronic kidney disease) stage 3, GFR 30-59 ml/min  Avoid NSAIDS  6. Primary insomnia Bedtime routine    Labs pending Health maintenance reviewed Diet and exercise encouraged Continue all meds Follow up  In 3 months   [place], FNP

## 2016-03-05 NOTE — Patient Instructions (Signed)
Diabetes and Foot Care Diabetes may cause you to have problems because of poor blood supply (circulation) to your feet and legs. This may cause the skin on your feet to become thinner, break easier, and heal more slowly. Your skin may become dry, and the skin may peel and crack. You may also have nerve damage in your legs and feet causing decreased feeling in them. You may not notice minor injuries to your feet that could lead to infections or more serious problems. Taking care of your feet is one of the most important things you can do for yourself.  HOME CARE INSTRUCTIONS  Wear shoes at all times, even in the house. Do not go barefoot. Bare feet are easily injured.  Check your feet daily for blisters, cuts, and redness. If you cannot see the bottom of your feet, use a mirror or ask someone for help.  Wash your feet with warm water (do not use hot water) and mild soap. Then pat your feet and the areas between your toes until they are completely dry. Do not soak your feet as this can dry your skin.  Apply a moisturizing lotion or petroleum jelly (that does not contain alcohol and is unscented) to the skin on your feet and to dry, brittle toenails. Do not apply lotion between your toes.  Trim your toenails straight across. Do not dig under them or around the cuticle. File the edges of your nails with an emery board or nail file.  Do not cut corns or calluses or try to remove them with medicine.  Wear clean socks or stockings every day. Make sure they are not too tight. Do not wear knee-high stockings since they may decrease blood flow to your legs.  Wear shoes that fit properly and have enough cushioning. To break in new shoes, wear them for just a few hours a day. This prevents you from injuring your feet. Always look in your shoes before you put them on to be sure there are no objects inside.  Do not cross your legs. This may decrease the blood flow to your feet.  If you find a minor scrape,  cut, or break in the skin on your feet, keep it and the skin around it clean and dry. These areas may be cleansed with mild soap and water. Do not cleanse the area with peroxide, alcohol, or iodine.  When you remove an adhesive bandage, be sure not to damage the skin around it.  If you have a wound, look at it several times a day to make sure it is healing.  Do not use heating pads or hot water bottles. They may burn your skin. If you have lost feeling in your feet or legs, you may not know it is happening until it is too late.  Make sure your health care provider performs a complete foot exam at least annually or more often if you have foot problems. Report any cuts, sores, or bruises to your health care provider immediately. SEEK MEDICAL CARE IF:   You have an injury that is not healing.  You have cuts or breaks in the skin.  You have an ingrown nail.  You notice redness on your legs or feet.  You feel burning or tingling in your legs or feet.  You have pain or cramps in your legs and feet.  Your legs or feet are numb.  Your feet always feel cold. SEEK IMMEDIATE MEDICAL CARE IF:   There is increasing redness,   swelling, or pain in or around a wound.  There is a red line that goes up your leg.  Pus is coming from a wound.  You develop a fever or as directed by your health care provider.  You notice a bad smell coming from an ulcer or wound.   This information is not intended to replace advice given to you by your health care provider. Make sure you discuss any questions you have with your health care provider.   Document Released: 04/20/2000 Document Revised: 12/24/2012 Document Reviewed: 09/30/2012 Elsevier Interactive Patient Education 2016 [employer]  

## 2016-03-06 LAB — CMP14+EGFR
ALBUMIN: 4.4 g/dL (ref 3.6–4.8)
ALK PHOS: 36 IU/L — AB (ref 39–117)
ALT: 27 IU/L (ref 0–44)
AST: 34 IU/L (ref 0–40)
Albumin/Globulin Ratio: 1.8 (ref 1.2–2.2)
BILIRUBIN TOTAL: 0.5 mg/dL (ref 0.0–1.2)
BUN / CREAT RATIO: 9 — AB (ref 10–24)
BUN: 17 mg/dL (ref 8–27)
CHLORIDE: 101 mmol/L (ref 96–106)
CO2: 21 mmol/L (ref 18–29)
Calcium: 9.7 mg/dL (ref 8.6–10.2)
Creatinine, Ser: 1.92 mg/dL — ABNORMAL HIGH (ref 0.76–1.27)
GFR calc Af Amer: 42 mL/min/{1.73_m2} — ABNORMAL LOW (ref >59)
GFR calc non Af Amer: 36 mL/min/{1.73_m2} — ABNORMAL LOW (ref >59)
GLUCOSE: 136 mg/dL — AB (ref 65–99)
Globulin, Total: 2.5 g/dL (ref 1.5–4.5)
Potassium: 5.3 mmol/L — ABNORMAL HIGH (ref 3.5–5.2)
Sodium: 139 mmol/L (ref 134–144)
Total Protein: 6.9 g/dL (ref 6.0–8.5)

## 2016-03-06 LAB — LIPID PANEL
CHOLESTEROL TOTAL: 130 mg/dL (ref 100–199)
Chol/HDL Ratio: 5.4 ratio units — ABNORMAL HIGH (ref 0.0–5.0)
HDL: 24 mg/dL — ABNORMAL LOW (ref >39)
LDL Calculated: 58 mg/dL (ref 0–99)
Triglycerides: 242 mg/dL — ABNORMAL HIGH (ref 0–149)
VLDL Cholesterol Cal: 48 mg/dL — ABNORMAL HIGH (ref 5–40)

## 2016-03-22 ENCOUNTER — Telehealth: Payer: Self-pay

## 2016-03-22 NOTE — Telephone Encounter (Signed)
What? He can have tricor or not

## 2016-05-13 ENCOUNTER — Other Ambulatory Visit: Payer: Self-pay | Admitting: Nurse Practitioner

## 2016-05-13 DIAGNOSIS — G4700 Insomnia, unspecified: Secondary | ICD-10-CM

## 2016-05-13 DIAGNOSIS — E782 Mixed hyperlipidemia: Secondary | ICD-10-CM

## 2016-05-13 DIAGNOSIS — I10 Essential (primary) hypertension: Secondary | ICD-10-CM

## 2016-05-18 ENCOUNTER — Ambulatory Visit (INDEPENDENT_AMBULATORY_CARE_PROVIDER_SITE_OTHER): Payer: Medicaid Other | Admitting: Nurse Practitioner

## 2016-05-18 ENCOUNTER — Other Ambulatory Visit: Payer: Self-pay | Admitting: *Deleted

## 2016-05-18 VITALS — BP 93/65 | HR 81 | Temp 99.2°F | Ht 70.0 in | Wt 163.0 lb

## 2016-05-18 DIAGNOSIS — R52 Pain, unspecified: Secondary | ICD-10-CM | POA: Diagnosis not present

## 2016-05-18 DIAGNOSIS — J209 Acute bronchitis, unspecified: Secondary | ICD-10-CM

## 2016-05-18 DIAGNOSIS — J0100 Acute maxillary sinusitis, unspecified: Secondary | ICD-10-CM

## 2016-05-18 LAB — VERITOR FLU A/B WAIVED
Influenza A: NEGATIVE
Influenza B: NEGATIVE

## 2016-05-18 MED ORDER — AZITHROMYCIN 500 MG PO TABS: ORAL_TABLET | ORAL | 0 refills | Status: DC

## 2016-05-18 MED ORDER — BENZONATATE 100 MG PO CAPS: 100.0000 mg | ORAL_CAPSULE | Freq: Three times a day (TID) | ORAL | 0 refills | Status: DC | PRN

## 2016-05-18 MED ORDER — AZITHROMYCIN 250 MG PO TABS: ORAL_TABLET | ORAL | 0 refills | Status: DC

## 2016-05-18 NOTE — Progress Notes (Signed)
Subjective:     Edward [name] is a 66 y.o. male here for evaluation of a cough. Onset of symptoms was 8 days ago. Symptoms have been unchanged since that time. The cough is productive of green sputum and is aggravated by nothing. Associated symptoms include: change in voice, chills and sputum production. Patient does not have a history of asthma. Patient does not have a history of environmental allergens. Patient has not traveled recently. Patient does not have a history of smoking. Patient has had a previous chest x-ray. Patient has not had a PPD done.  The following portions of the patient's history were reviewed and updated as appropriate: allergies, current medications, past family history, past medical history, past social history, past surgical history and problem list.  Review of Systems Pertinent items noted in HPI and remainder of comprehensive ROS otherwise negative.    Objective:     BP 93/65   Pulse 81   Temp 99.2 F (37.3 C) (Oral)   Ht 5\' 10"  (1.778 m)   Wt 163 lb (73.9 kg)   BMI 23.39 kg/m  General appearance: alert, cooperative and no distress Eyes: conjunctivae/corneas clear. PERRL, EOM's intact. Fundi benign. Ears: normal TM's and external ear canals both ears Nose: green discharge, moderate congestion, turbinates red, sinus tenderness bilateral Throat: lips, mucosa, and tongue normal; teeth and gums normal Neck: no adenopathy, no carotid bruit, no JVD, supple, symmetrical, trachea midline and thyroid not enlarged, symmetric, no tenderness/mass/nodules Lungs: rhonchi throughout lung fields Heart: regular rate and rhythm, S1, S2 normal, no murmur, click, rub or gallop   flu negative Assessment:    Acute Bronchitis and Sinusitis    Plan:  1. Take meds as prescribed 2. Use a cool mist humidifier especially during the winter months and when heat has been humid. 3. Use saline nose sprays frequently 4. Saline irrigations of the nose can be very helpful if done  frequently.  * 4X daily for 1 week*  * Use of a nettie pot can be helpful with this. Follow directions with this* 5. Drink plenty of fluids 6. Keep thermostat turn down low 7.For any cough or congestion  Use plain Mucinex- regular strength or max strength is fine   * Children- consult with Pharmacist for dosing 8. For fever or aces or pains- take tylenol or ibuprofen appropriate for age and weight.  * for fevers greater than 101 orally you may alternate ibuprofen and tylenol every  3 hours.   Meds ordered this encounter  Medications  . azithromycin (ZITHROMAX) 500 MG tablet    Sig: As directed    Dispense:  6 tablet    Refill:  0    Order Specific Question:   Supervising Provider    Answer:   VINCENT, CAROL L [[id number]]  . benzonatate (TESSALON) 100 MG capsule    Sig: Take 1 capsule (100 mg total) by mouth 3 (three) times daily as needed for cough.    Dispense:  20 capsule    Refill:  0    Order Specific Question:   Supervising Provider    Answer:   Eustaquio Maize [place], FNP

## 2016-05-18 NOTE — Patient Instructions (Signed)

## 2016-07-09 ENCOUNTER — Other Ambulatory Visit: Payer: Self-pay | Admitting: Nurse Practitioner

## 2016-07-10 ENCOUNTER — Encounter: Payer: Self-pay | Admitting: Nurse Practitioner

## 2016-07-10 ENCOUNTER — Ambulatory Visit (INDEPENDENT_AMBULATORY_CARE_PROVIDER_SITE_OTHER): Payer: Medicaid Other | Admitting: Nurse Practitioner

## 2016-07-10 VITALS — BP 106/71 | HR 63 | Temp 97.2°F | Ht 70.0 in | Wt 165.0 lb

## 2016-07-10 DIAGNOSIS — E782 Mixed hyperlipidemia: Secondary | ICD-10-CM

## 2016-07-10 DIAGNOSIS — E1122 Type 2 diabetes mellitus with diabetic chronic kidney disease: Secondary | ICD-10-CM | POA: Diagnosis not present

## 2016-07-10 DIAGNOSIS — N183 Chronic kidney disease, stage 3 unspecified: Secondary | ICD-10-CM

## 2016-07-10 DIAGNOSIS — I10 Essential (primary) hypertension: Secondary | ICD-10-CM | POA: Diagnosis not present

## 2016-07-10 DIAGNOSIS — F5101 Primary insomnia: Secondary | ICD-10-CM | POA: Diagnosis not present

## 2016-07-10 DIAGNOSIS — I25119 Atherosclerotic heart disease of native coronary artery with unspecified angina pectoris: Secondary | ICD-10-CM | POA: Diagnosis not present

## 2016-07-10 LAB — BAYER DCA HB A1C WAIVED: HB A1C (BAYER DCA - WAIVED): 6 % (ref <7.0)

## 2016-07-10 MED ORDER — LISINOPRIL 20 MG PO TABS: 20.0000 mg | ORAL_TABLET | Freq: Every day | ORAL | 1 refills | Status: DC

## 2016-07-10 MED ORDER — SITAGLIPTIN PHOSPHATE 100 MG PO TABS: 100.0000 mg | ORAL_TABLET | Freq: Every day | ORAL | 1 refills | Status: DC

## 2016-07-10 MED ORDER — TRAZODONE HCL 100 MG PO TABS: 100.0000 mg | ORAL_TABLET | Freq: Every day | ORAL | 1 refills | Status: DC

## 2016-07-10 MED ORDER — ATORVASTATIN CALCIUM 40 MG PO TABS: 40.0000 mg | ORAL_TABLET | Freq: Every day | ORAL | 1 refills | Status: DC

## 2016-07-10 MED ORDER — METOPROLOL TARTRATE 25 MG PO TABS: 25.0000 mg | ORAL_TABLET | Freq: Two times a day (BID) | ORAL | 1 refills | Status: DC

## 2016-07-10 MED ORDER — TRICOR 145 MG PO TABS: 145.0000 mg | ORAL_TABLET | Freq: Every day | ORAL | 1 refills | Status: DC

## 2016-07-10 MED ORDER — METFORMIN HCL 500 MG PO TABS: 500.0000 mg | ORAL_TABLET | Freq: Two times a day (BID) | ORAL | 1 refills | Status: DC

## 2016-07-10 NOTE — Progress Notes (Signed)
Subjective:    Patient ID: Edward [name], male    DOB: 1950-06-10, 66 y.o.   MRN: [id number]  Patient here today for follow up of chronic medical problems. No chnages since last visit. NO complaints today.  Current Outpatient Prescriptions on File Prior to Visit  Medication Sig Dispense Refill  . ACCU-CHEK AVIVA PLUS test strip USE TO CHECK GLUCOSE ONCE DAILY 100 each 9  . aspirin 81 MG tablet Take 81 mg by mouth daily.      [name] atorvastatin (LIPITOR) 40 MG tablet Take 1 tablet (40 mg total) by mouth daily. 90 tablet 1  . brimonidine (ALPHAGAN) 0.15 % ophthalmic solution Place 1 drop into both eyes 2 (two) times daily.    . Cholecalciferol (VITAMIN D3) 2000 UNITS TABS Take 1 tablet by mouth daily.    . fish oil-omega-3 fatty acids 1000 MG capsule Take 1 g by mouth 2 (two) times daily.      [name] glipiZIDE (GLUCOTROL) 10 MG tablet Take 1 tablet (10 mg total) by mouth 2 (two) times daily before a meal. 60 tablet 5  . JANUVIA 100 MG tablet TAKE ONE TABLET BY MOUTH ONCE DAILY 30 tablet 5  . lisinopril (PRINIVIL,ZESTRIL) 20 MG tablet TAKE ONE TABLET BY MOUTH ONCE DAILY 30 tablet 5  . metFORMIN (GLUCOPHAGE) 500 MG tablet Take 1 tablet (500 mg total) by mouth 2 (two) times daily with a meal. 180 tablet 3  . metoprolol tartrate (LOPRESSOR) 25 MG tablet TAKE ONE TABLET BY MOUTH TWICE DAILY 60 tablet 5  . nitroGLYCERIN (NITROSTAT) 0.4 MG SL tablet Place 1 tablet (0.4 mg total) under the tongue every 5 (five) minutes as needed. 25 tablet 3  . sitaGLIPtin (JANUVIA) 100 MG tablet Take 1 tablet (100 mg total) by mouth daily. 30 tablet 5  . traZODone (DESYREL) 100 MG tablet TAKE ONE TABLET BY MOUTH ONCE DAILY AT BEDTIME 30 tablet 5  . TRICOR 145 MG tablet Take 1 tablet (145 mg total) by mouth daily. 30 tablet 5   No current facility-administered medications on file prior to visit.     Diabetes  He has type 2 diabetes mellitus. No MedicAlert identification noted. His disease course has been fluctuating (last  Hgba1c was 6.4%). Pertinent negatives for hypoglycemia include no headaches. Pertinent negatives for diabetes include no chest pain, no fatigue, no polydipsia, no polyphagia and no visual change. Diabetic complications include heart disease. Risk factors for coronary artery disease include male sex, diabetes mellitus, dyslipidemia and hypertension. Current diabetic treatment includes oral agent (dual therapy) (was on tanzem at l;ast visit but was making him feel bad so we stopped it. Currently just on januvia and glipizide.). He is compliant with treatment most of the time. His weight is stable. When asked about meal planning, he reported none. He has not had a previous visit with a dietitian. He rarely participates in exercise. His home blood glucose trend is fluctuating minimally. His breakfast blood glucose is taken between 9-10 am. His breakfast blood glucose range is generally 70-90 mg/dl. His dinner blood glucose range is generally 110-130 mg/dl. His highest blood glucose is 130-140 mg/dl. His overall blood glucose range is 130-140 mg/dl. An ACE inhibitor/angiotensin II receptor blocker is being taken. He does not see a podiatrist.Eye exam is not current.  Hypertension  This is a chronic problem. The current episode started more than 1 year ago. The problem is unchanged. The problem is controlled. Pertinent negatives include no chest pain, headaches, palpitations or shortness of breath.  Past treatments include ACE inhibitors. The current treatment provides moderate improvement. Compliance problems include diet and exercise.  Hypertensive end-organ damage includes CAD/MI.  Hyperlipidemia  This is a chronic problem. The current episode started more than 1 year ago. The problem is controlled. Recent lipid tests were reviewed and are normal. Exacerbating diseases include diabetes. He has no history of hypothyroidism or obesity. Pertinent negatives include no chest pain, myalgias or shortness of breath. Current  antihyperlipidemic treatment includes statins and fibric acid derivatives. The current treatment provides moderate improvement of lipids. Compliance problems include adherence to exercise and adherence to diet.   CAD Metoprolol daily- had MI in 2006- sees cardiologist yearly- No c/o chest pain or SOB insomnia Patient takes trazadone nightly which helps him to sleep- feels rested in AM. CKD- stage 3 Does not see kidney specialist currently- will continue to monitor  Review of Systems  Constitutional: Negative.  Negative for fatigue.  HENT: Negative.   Eyes: Negative.   Respiratory: Negative.  Negative for shortness of breath.   Cardiovascular: Negative.  Negative for chest pain and palpitations.  Gastrointestinal: Negative.   Endocrine: Negative.  Negative for polydipsia and polyphagia.  Genitourinary: Negative.   Musculoskeletal: Negative.  Negative for myalgias.  Skin: Negative.   Allergic/Immunologic: Negative.   Neurological: Negative.  Negative for headaches.  Hematological: Negative.   Psychiatric/Behavioral: Negative.   All other systems reviewed and are negative.      Objective:   Physical Exam  Constitutional: He is oriented to person, place, and time. He appears well-developed and well-nourished.  HENT:  Head: Normocephalic.  Right Ear: External ear normal.  Left Ear: External ear normal.  Nose: Nose normal.  Mouth/Throat: Oropharynx is clear and moist.  Eyes: Conjunctivae and EOM are normal. Pupils are equal, round, and reactive to light.  Neck: Normal range of motion. Neck supple. No JVD present. No thyromegaly present.  Cardiovascular: Normal rate, regular rhythm, normal heart sounds and intact distal pulses.  Exam reveals no gallop and no friction rub.   No murmur heard. Pulmonary/Chest: Effort normal and breath sounds normal. No respiratory distress. He has no wheezes. He has no rales. He exhibits no tenderness.  Abdominal: Soft. Bowel sounds are normal. He  exhibits no mass. There is no tenderness.  Musculoskeletal: Normal range of motion. He exhibits no edema.  Lymphadenopathy:    He has no cervical adenopathy.  Neurological: He is alert and oriented to person, place, and time. No cranial nerve deficit.  Skin: Skin is warm and dry.  Psychiatric: He has a normal mood and affect. His behavior is normal. Judgment and thought content normal.    BP 106/71   Pulse 63   Temp 97.2 F (36.2 C) (Oral)   Ht 5' 10"  (1.778 m)   Wt 165 lb (74.8 kg)   BMI 23.68 kg/m   HGBA1c 6.0% down from 6.4%  Assessment & Plan:  1. Mixed hyperlipidemia Low fat diet - Lipid panel - atorvastatin (LIPITOR) 40 MG tablet; Take 1 tablet (40 mg total) by mouth daily.  Dispense: 90 tablet; Refill: 1 - TRICOR 145 MG tablet; Take 1 tablet (145 mg total) by mouth daily.  Dispense: 90 tablet; Refill: 1  2. Essential hypertension DASH diet - CMP14+EGFR - metoprolol tartrate (LOPRESSOR) 25 MG tablet; Take 1 tablet (25 mg total) by mouth 2 (two) times daily.  Dispense: 180 tablet; Refill: 1 - lisinopril (PRINIVIL,ZESTRIL) 20 MG tablet; Take 1 tablet (20 mg total) by mouth daily.  Dispense: 90 tablet;  Refill: 1  3. Type 2 diabetes mellitus with stage 3 chronic kidney disease, without long-term current use of insulin (HCC) Continue to watch carbs Going to hold glipizide for now Keep diary of blood sugars and let me know if they icrease above 130 fasting ( he has had severals below 50 in  mprnings - Bayer DCA Hb A1c Waived - Microalbumin / creatinine urine ratio - metFORMIN (GLUCOPHAGE) 500 MG tablet; Take 1 tablet (500 mg total) by mouth 2 (two) times daily with a meal.  Dispense: 180 tablet; Refill: 1 - sitaGLIPtin (JANUVIA) 100 MG tablet; Take 1 tablet (100 mg total) by mouth daily.  Dispense: 90 tablet; Refill: 1  4. Atherosclerosis of native coronary artery of native heart with angina pectoris ([place]) 5. CKD (chronic kidney disease) stage 3, GFR 30-59 ml/min wil  continue to watch  6. Primary insomnia Bedtime ritual - traZODone (DESYREL) 100 MG tablet; Take 1 tablet (100 mg total) by mouth daily with breakfast.  Dispense: 90 tablet; Refill: 1    Labs pending Health maintenance reviewed Diet and exercise encouraged Continue all meds Follow up  In 3 months   [place], FNP

## 2016-07-10 NOTE — Patient Instructions (Signed)
Hypoglycemia  Hypoglycemia is when the sugar (glucose) level in the blood is too low. Symptoms of low blood sugar may include:  Feeling:  Hungry.  Worried or nervous (anxious).  Sweaty and clammy.  Confused.  Dizzy.  Sleepy.  Sick to your stomach (nauseous).  Having:  A fast heartbeat.  A headache.  A change in your vision.  Jerky movements that you cannot control (seizure).  Nightmares.  Tingling or no feeling (numbness) around the mouth, lips, or tongue.  Having trouble with:  Talking.  Paying attention (concentrating).  Moving (coordination).  Sleeping.  Shaking.  Passing out (fainting).  Getting upset easily (irritability). Low blood sugar can happen to people who have diabetes and people who do not have diabetes. Low blood sugar can happen quickly, and it can be an emergency. Treating Low Blood Sugar  Low blood sugar is often treated by eating or drinking something sugary right away. If you can think clearly and swallow safely, follow the 15:15 rule:  Take 15 grams of a fast-acting carb (carbohydrate). Some fast-acting carbs are:  1 tube of glucose gel.  3 sugar tablets (glucose pills).  6-8 pieces of hard candy.  4 oz (120 mL) of fruit juice.  4 oz (120 mL) of regular (not diet) soda.  Check your blood sugar 15 minutes after you take the carb.  If your blood sugar is still at or below 70 mg/dL (3.9 mmol/L), take 15 grams of a carb again.  If your blood sugar does not go above 70 mg/dL (3.9 mmol/L) after 3 tries, get help right away.  After your blood sugar goes back to normal, eat a meal or a snack within 1 hour. Treating Very Low Blood Sugar  If your blood sugar is at or below 54 mg/dL (3 mmol/L), you have very low blood sugar (severe hypoglycemia). This is an emergency. Do not wait to see if the symptoms will go away. Get medical help right away. Call your local emergency services (911 in the [place] Do not drive yourself to the  hospital. If you have very low blood sugar and you cannot eat or drink, you may need a glucagon shot (injection). A family member or friend should learn how to check your blood sugar and how to give you a glucagon shot. Ask your doctor if you need to have a glucagon shot kit at home. Follow these instructions at home: General instructions   Avoid any diets that cause you to not eat enough food. Talk with your doctor before you start any new diet.  Take over-the-counter and prescription medicines only as told by your doctor.  Limit alcohol to no more than 1 drink per day for nonpregnant women and 2 drinks per day for men. One drink equals 12 oz of beer, 5 oz of wine, or 1 oz of hard liquor.  Keep all follow-up visits as told by your doctor. This is important. If You Have Diabetes:    Make sure you know the symptoms of low blood sugar.  Always keep a source of sugar with you, such as:  Sugar.  Sugar tablets.  Glucose gel.  Fruit juice.  Regular soda (not diet soda).  Milk.  Hard candy.  Honey.  Take your medicines as told.  Follow your exercise and meal plan.  Eat on time. Do not skip meals.  Follow your sick day plan when you cannot eat or drink normally. Make this plan ahead of time with your doctor.  Check your blood  often as told by your doctor. Always check before and after exercise. °· Share your diabetes care plan with: °¨ Your work or school. °¨ People you live with. °· Check your pee (urine) for ketones: °¨ When you are sick. °¨ As told by your doctor. °· Carry a card or wear jewelry that says you have diabetes. °If You Have Low Blood Sugar From Other Causes:  °· Check your blood sugar as often as told by your doctor. °· Follow instructions from your doctor about what you cannot eat or drink. °Contact a doctor if: °· You have trouble keeping your blood sugar in your target range. °· You have low blood sugar often. °Get help right away if: °· You still have  symptoms after you eat or drink something sugary. °· Your blood sugar is at or below 54 mg/dL (3 mmol/L). °· You have jerky movements that you cannot control. °· You pass out. °These symptoms may be an emergency. Do not wait to see if the symptoms will go away. Get medical help right away. Call your local emergency services (911 in the [place] Do not drive yourself to the hospital.  °This information is not intended to replace advice given to you by your health care provider. Make sure you discuss any questions you have with your health care provider. °Document Released: 07/18/2009 Document Revised: 09/29/2015 Document Reviewed: 05/27/2015 °Elsevier Interactive Patient Education © 2017 [employer] ° °

## 2016-07-11 LAB — LIPID PANEL
Chol/HDL Ratio: 6 ratio units — ABNORMAL HIGH (ref 0.0–5.0)
Cholesterol, Total: 137 mg/dL (ref 100–199)
HDL: 23 mg/dL — ABNORMAL LOW (ref >39)
LDL CALC: 70 mg/dL (ref 0–99)
TRIGLYCERIDES: 218 mg/dL — AB (ref 0–149)
VLDL CHOLESTEROL CAL: 44 mg/dL — AB (ref 5–40)

## 2016-07-11 LAB — CMP14+EGFR
ALBUMIN: 4.2 g/dL (ref 3.6–4.8)
ALT: 27 IU/L (ref 0–44)
AST: 37 IU/L (ref 0–40)
Albumin/Globulin Ratio: 1.6 (ref 1.2–2.2)
Alkaline Phosphatase: 33 IU/L — ABNORMAL LOW (ref 39–117)
BUN/Creatinine Ratio: 15 (ref 10–24)
BUN: 29 mg/dL — AB (ref 8–27)
Bilirubin Total: 0.4 mg/dL (ref 0.0–1.2)
CALCIUM: 9.4 mg/dL (ref 8.6–10.2)
CO2: 21 mmol/L (ref 18–29)
Chloride: 101 mmol/L (ref 96–106)
Creatinine, Ser: 1.94 mg/dL — ABNORMAL HIGH (ref 0.76–1.27)
GFR calc Af Amer: 41 mL/min/{1.73_m2} — ABNORMAL LOW (ref >59)
GFR calc non Af Amer: 36 mL/min/{1.73_m2} — ABNORMAL LOW (ref >59)
GLOBULIN, TOTAL: 2.6 g/dL (ref 1.5–4.5)
Glucose: 81 mg/dL (ref 65–99)
Potassium: 4.9 mmol/L (ref 3.5–5.2)
SODIUM: 137 mmol/L (ref 134–144)
Total Protein: 6.8 g/dL (ref 6.0–8.5)

## 2016-07-11 LAB — MICROALBUMIN / CREATININE URINE RATIO
CREATININE, UR: 136 mg/dL
MICROALBUM., U, RANDOM: 16.8 ug/mL
Microalb/Creat Ratio: 12.4 mg/g creat (ref 0.0–30.0)

## 2016-07-19 ENCOUNTER — Encounter: Payer: Self-pay | Admitting: *Deleted

## 2016-07-20 ENCOUNTER — Encounter: Payer: Self-pay | Admitting: Cardiology

## 2016-07-20 ENCOUNTER — Ambulatory Visit (INDEPENDENT_AMBULATORY_CARE_PROVIDER_SITE_OTHER): Payer: Medicaid Other | Admitting: Cardiology

## 2016-07-20 VITALS — BP 103/67 | HR 62 | Ht 70.0 in | Wt 162.6 lb

## 2016-07-20 DIAGNOSIS — N183 Chronic kidney disease, stage 3 unspecified: Secondary | ICD-10-CM

## 2016-07-20 DIAGNOSIS — E782 Mixed hyperlipidemia: Secondary | ICD-10-CM

## 2016-07-20 DIAGNOSIS — I10 Essential (primary) hypertension: Secondary | ICD-10-CM | POA: Diagnosis not present

## 2016-07-20 DIAGNOSIS — I25119 Atherosclerotic heart disease of native coronary artery with unspecified angina pectoris: Secondary | ICD-10-CM | POA: Diagnosis not present

## 2016-07-20 NOTE — Progress Notes (Signed)
Cardiology Office Note  Date: 07/20/2016   ID: Edward [name], DOB 06/11/1950, MRN [id number]  PCP: [name], FNP  Primary Cardiologist: Rozann Lesches, MD   Chief Complaint  Patient presents with  . Coronary Artery Disease    History of Present Illness: Edward [name] is a 66 y.o. male last seen in June 2017. He is here today with his wife for a routine visit. States that he has done well overall since our last visit. Had interval cataract surgery. No other major health change. He does not report any angina symptoms or unusual shortness of breath.  Current cardiac regimen includes aspirin, Lipitor, lisinopril, Lopressor, TriCor, and as needed nitroglycerin.  I reviewed his recent lab work outlined below.  He underwent stress testing within the last 2 years. We continue observation at this point.  Past Medical History:  Diagnosis Date  . Cataract   . CKD (chronic kidney disease) stage 3, GFR 30-59 ml/min   . Coronary atherosclerosis of native coronary artery    BMS LAD 2006; residual 75% distal RCA; EF 50%  . Essential hypertension, benign   . Glaucoma   . Low back pain   . MI (myocardial infarction)    Anterior 2006  . Mixed hyperlipidemia   . Type 2 diabetes mellitus ([place])     Past Surgical History:  Procedure Laterality Date  . CORONARY ANGIOPLASTY WITH STENT PLACEMENT  2006    Current Outpatient Prescriptions  Medication Sig Dispense Refill  . ACCU-CHEK AVIVA PLUS test strip USE TO CHECK GLUCOSE ONCE DAILY 100 each 9  . aspirin 81 MG tablet Take 81 mg by mouth daily.      [name] atorvastatin (LIPITOR) 40 MG tablet Take 1 tablet (40 mg total) by mouth daily. 90 tablet 1  . brimonidine (ALPHAGAN) 0.15 % ophthalmic solution Place 1 drop into both eyes 2 (two) times daily.    . Cholecalciferol (VITAMIN D3) 2000 UNITS TABS Take 1 tablet by mouth daily.    . fish oil-omega-3 fatty acids 1000 MG capsule Take 1 g by mouth 2 (two) times daily.      [name]  lisinopril (PRINIVIL,ZESTRIL) 20 MG tablet Take 1 tablet (20 mg total) by mouth daily. 90 tablet 1  . metFORMIN (GLUCOPHAGE) 500 MG tablet Take 1 tablet (500 mg total) by mouth 2 (two) times daily with a meal. 180 tablet 1  . metoprolol tartrate (LOPRESSOR) 25 MG tablet Take 1 tablet (25 mg total) by mouth 2 (two) times daily. 180 tablet 1  . nitroGLYCERIN (NITROSTAT) 0.4 MG SL tablet Place 1 tablet (0.4 mg total) under the tongue every 5 (five) minutes as needed. 25 tablet 3  . sitaGLIPtin (JANUVIA) 100 MG tablet Take 1 tablet (100 mg total) by mouth daily. 90 tablet 1  . traZODone (DESYREL) 100 MG tablet Take 1 tablet (100 mg total) by mouth daily with breakfast. 90 tablet 1  . TRICOR 145 MG tablet Take 1 tablet (145 mg total) by mouth daily. 90 tablet 1   No current facility-administered medications for this visit.    Allergies:  Patient has no known allergies.   Social History: The patient  reports that he quit smoking about 29 years ago. His smoking use included Cigarettes. He has a 40.00 pack-year smoking history. He has never used smokeless tobacco. He reports that he does not drink alcohol or use drugs.   ROS:  Please see the history of present illness. Otherwise, complete review of systems is positive for  none.  All other systems are reviewed and negative.   Physical Exam: VS:  BP 103/67   Pulse 62   Ht 5\' 10"  (1.778 m)   Wt 162 lb 9.6 oz (73.8 kg)   SpO2 95%   BMI 23.33 kg/m , BMI Body mass index is 23.33 kg/m.  Wt Readings from Last 3 Encounters:  07/20/16 162 lb 9.6 oz (73.8 kg)  07/10/16 165 lb (74.8 kg)  05/18/16 163 lb (73.9 kg)    Appears comfortable.  HEENT: Conjunctiva and lids normal, oropharynx clear.  Neck: Supple, no elevated JVP or carotid bruits, no thyromegaly.  Lungs: Clear to auscultation, nonlabored breathing at rest.  Cardiac: Regular rate and rhythm, no S3 or significant systolic murmur, no pericardial rub.  Abdomen: Soft, nontender, no  hepatomegaly, bowel sounds present, no guarding or rebound.  Extremities: No pitting edema, distal pulses 2+.  ECG: I personally reviewed the tracing from 10/27/2015 which showed sinus bradycardia with poor R-wave progression and low voltage in limb leads.  Recent Labwork: 07/10/2016: ALT 27; AST 37; BUN 29; Creatinine, Ser 1.94; Potassium 4.9; Sodium 137     Component Value Date/Time   CHOL 137 07/10/2016 0817   CHOL 177 09/25/2012 1015   TRIG 218 (H) 07/10/2016 0817   TRIG 206 (H) 10/07/2014 0927   TRIG 269 (H) 09/25/2012 1015   HDL 23 (L) 07/10/2016 0817   HDL 22 (L) 10/07/2014 0927   HDL 28 (L) 09/25/2012 1015   CHOLHDL 6.0 (H) 07/10/2016 0817   LDLCALC 70 07/10/2016 0817   LDLCALC 98 12/21/2013 0850   LDLCALC 95 09/25/2012 1015    Other Studies Reviewed Today:  [facility] Adam Cardiolite 09/13/2014:  This is a low risk study.  Findings consistent with prior myocardial infarction.  Defect 1: There is a medium defect of moderate severity present in the apical anterior and apex location.  Assessment and Plan:  1. CAD status post BMS to the LAD in 2006 with moderate distal RCA disease that is being managed medically. He reports no angina on medical therapy and had a low risk Cardiolite within the last 2 years. We continue with observation.  2. Mixed hyperlipidemia, on statin therapy and TriCor. Recent lipid panel showed mildly elevated triglycerides but well controlled LDL 70. Keep follow-up with PCP.  3. CKD stage III, recent creatinine 1.9.  4. Essential hypertension, blood pressure well controlled on Lopressor and lisinopril.  Current medicines were reviewed with the patient today.  Disposition: Follow-up in 6 months.  Signed, Satira Sark, MD, [facility] 07/20/2016 3:11 PM    [facility] [place], [place],  [address] Phone: [phone]; Fax: [phone]

## 2016-07-20 NOTE — Patient Instructions (Signed)

## 2016-10-12 ENCOUNTER — Encounter: Payer: Self-pay | Admitting: Nurse Practitioner

## 2016-10-12 ENCOUNTER — Ambulatory Visit (INDEPENDENT_AMBULATORY_CARE_PROVIDER_SITE_OTHER): Payer: Medicaid Other | Admitting: Nurse Practitioner

## 2016-10-12 VITALS — BP 98/57 | HR 58 | Temp 97.0°F | Ht 70.0 in | Wt 164.8 lb

## 2016-10-12 DIAGNOSIS — F5101 Primary insomnia: Secondary | ICD-10-CM

## 2016-10-12 DIAGNOSIS — E1122 Type 2 diabetes mellitus with diabetic chronic kidney disease: Secondary | ICD-10-CM | POA: Diagnosis not present

## 2016-10-12 DIAGNOSIS — I25119 Atherosclerotic heart disease of native coronary artery with unspecified angina pectoris: Secondary | ICD-10-CM | POA: Diagnosis not present

## 2016-10-12 DIAGNOSIS — N183 Chronic kidney disease, stage 3 unspecified: Secondary | ICD-10-CM

## 2016-10-12 DIAGNOSIS — E782 Mixed hyperlipidemia: Secondary | ICD-10-CM | POA: Diagnosis not present

## 2016-10-12 DIAGNOSIS — I10 Essential (primary) hypertension: Secondary | ICD-10-CM | POA: Diagnosis not present

## 2016-10-12 LAB — BAYER DCA HB A1C WAIVED: HB A1C (BAYER DCA - WAIVED): 6.1 % (ref <7.0)

## 2016-10-12 NOTE — Progress Notes (Signed)
Subjective:    Patient ID: Edward [name], male    DOB: Feb 19, 1951, 66 y.o.   MRN: [id number]  HPI   Edward [name] [facility] is here today for follow up of chronic medical problem.  Outpatient Encounter Prescriptions as of 10/12/2016  Medication Sig  . ACCU-CHEK AVIVA PLUS test strip USE TO CHECK GLUCOSE ONCE DAILY  . aspirin 81 MG tablet Take 81 mg by mouth daily.    [name] atorvastatin (LIPITOR) 40 MG tablet Take 1 tablet (40 mg total) by mouth daily.  . brimonidine (ALPHAGAN) 0.15 % ophthalmic solution Place 1 drop into both eyes 2 (two) times daily.  . Cholecalciferol (VITAMIN D3) 2000 UNITS TABS Take 1 tablet by mouth daily.  . fish oil-omega-3 fatty acids 1000 MG capsule Take 1 g by mouth 2 (two) times daily.    [name] lisinopril (PRINIVIL,ZESTRIL) 20 MG tablet Take 1 tablet (20 mg total) by mouth daily.  . metFORMIN (GLUCOPHAGE) 500 MG tablet Take 1 tablet (500 mg total) by mouth 2 (two) times daily with a meal.  . metoprolol tartrate (LOPRESSOR) 25 MG tablet Take 1 tablet (25 mg total) by mouth 2 (two) times daily.  . nitroGLYCERIN (NITROSTAT) 0.4 MG SL tablet Place 1 tablet (0.4 mg total) under the tongue every 5 (five) minutes as needed.  . sitaGLIPtin (JANUVIA) 100 MG tablet Take 1 tablet (100 mg total) by mouth daily.  . traZODone (DESYREL) 100 MG tablet Take 1 tablet (100 mg total) by mouth daily with breakfast.  . TRICOR 145 MG tablet Take 1 tablet (145 mg total) by mouth daily.   No facility-administered encounter medications on file as of 10/12/2016.     1. Type 2 diabetes mellitus with stage 3 chronic kidney disease, without long-term current use of insulin (Edward [name]) last hgba1c was 6.0%. Watches diet- blood sugars fasting are always below 120. No hypoglucemia. We had stopped his glipizide at last visit but blood sugar had gone up about 60 points so he went back on glipizide 1/2 tablet BID.  2. Essential hypertension  No c/o chest pain,SOB or HA- blood presure at home has been running in 26'S  systolic  3. Mixed hyperlipidemia  Tries to eat low fat diet regularly  4. Atherosclerosis of native coronary artery of native heart with angina pectoris (HCC)  No recent chest pain  5. Primary insomnia  Currently is on no meds- says sleeping has improved some. Still has trouble falling asleep but once asleep sleeps pretty well.  6. CKD (chronic kidney disease) stage 3, GFR 30-59 ml/min  We are currently just watching labs    New complaints: None today      Review of Systems  Constitutional: Negative for activity change and appetite change.  HENT: Negative.   Eyes: Negative for pain.  Respiratory: Negative for shortness of breath.   Cardiovascular: Negative for chest pain, palpitations and leg swelling.  Gastrointestinal: Negative for abdominal pain.  Endocrine: Negative for polydipsia.  Genitourinary: Negative.   Musculoskeletal: Negative.   Skin: Negative for rash.  Neurological: Negative for dizziness, weakness and headaches.  Hematological: Does not bruise/bleed easily.  Psychiatric/Behavioral: Negative.   All other systems reviewed and are negative.      Objective:   Physical Exam  Constitutional: He is oriented to person, place, and time. He appears well-developed and well-nourished.  HENT:  Head: Normocephalic.  Right Ear: External ear normal.  Left Ear: External ear normal.  Nose: Nose normal.  Mouth/Throat: Oropharynx is clear and moist.  Eyes: EOM are normal. Pupils are equal, round, and reactive to light.  Neck: Normal range of motion. Neck supple. No JVD present. No thyromegaly present.  Cardiovascular: Normal rate, regular rhythm, normal heart sounds and intact distal pulses.  Exam reveals no gallop and no friction rub.   No murmur heard. Pulmonary/Chest: Effort normal and breath sounds normal. No respiratory distress. He has no wheezes. He has no rales. He exhibits no tenderness.  Abdominal: Soft. Bowel sounds are normal. He exhibits no mass. There is  no tenderness.  Musculoskeletal: Normal range of motion. He exhibits no edema.  Lymphadenopathy:    He has no cervical adenopathy.  Neurological: He is alert and oriented to person, place, and time. No cranial nerve deficit.  Skin: Skin is warm and dry.  Psychiatric: He has a normal mood and affect. His behavior is normal. Judgment and thought content normal.    BP (!) 98/57   Pulse (!) 58   Temp 97 F (36.1 C) (Oral)   Ht 5' 10"  (1.778 m)   Wt 164 lb 12.8 oz (74.8 kg)   BMI 23.65 kg/m   hgba1c 61%     Assessment & Plan:  1. Type 2 diabetes mellitus with stage 3 chronic kidney disease, without long-term current use of insulin (HCC) Continue current routine and watching carbs - Bayer DCA Hb A1c Waived  2. Essential hypertension Low sodium diet Decrease lisinopril to 35m a day- 1/2 tablet of what currently have continue to keep check of blood pressures at home - CMP14+EGFR  3. Mixed hyperlipidemia Low fat diet - Lipid panel  4. Atherosclerosis of native coronary artery of native heart with angina pectoris ([place] Keep follow up with cardioloogy yearly  5. Primary insomnia Bedtime routine  6. CKD (chronic kidney disease) stage 3, GFR 30-59 ml/min Checking labs today    Labs pending Health maintenance reviewed Diet and exercise encouraged Continue all meds Follow up  In 3 months   M[place]ouster FNP

## 2016-10-12 NOTE — Patient Instructions (Signed)

## 2016-10-13 LAB — CMP14+EGFR
ALT: 30 IU/L (ref 0–44)
AST: 38 IU/L (ref 0–40)
Albumin/Globulin Ratio: 1.6 (ref 1.2–2.2)
Albumin: 4.2 g/dL (ref 3.6–4.8)
Alkaline Phosphatase: 32 IU/L — ABNORMAL LOW (ref 39–117)
BUN/Creatinine Ratio: 9 — ABNORMAL LOW (ref 10–24)
BUN: 18 mg/dL (ref 8–27)
Bilirubin Total: 0.3 mg/dL (ref 0.0–1.2)
CALCIUM: 9.3 mg/dL (ref 8.6–10.2)
CO2: 21 mmol/L (ref 18–29)
CREATININE: 1.99 mg/dL — AB (ref 0.76–1.27)
Chloride: 105 mmol/L (ref 96–106)
GFR calc Af Amer: 40 mL/min/{1.73_m2} — ABNORMAL LOW (ref >59)
GFR, EST NON AFRICAN AMERICAN: 34 mL/min/{1.73_m2} — AB (ref >59)
Globulin, Total: 2.6 g/dL (ref 1.5–4.5)
Glucose: 90 mg/dL (ref 65–99)
Potassium: 5.1 mmol/L (ref 3.5–5.2)
Sodium: 140 mmol/L (ref 134–144)
TOTAL PROTEIN: 6.8 g/dL (ref 6.0–8.5)

## 2016-10-13 LAB — LIPID PANEL
CHOL/HDL RATIO: 6 ratio — AB (ref 0.0–5.0)
Cholesterol, Total: 132 mg/dL (ref 100–199)
HDL: 22 mg/dL — AB (ref >39)
LDL Calculated: 63 mg/dL (ref 0–99)
TRIGLYCERIDES: 237 mg/dL — AB (ref 0–149)
VLDL CHOLESTEROL CAL: 47 mg/dL — AB (ref 5–40)

## 2016-10-16 ENCOUNTER — Other Ambulatory Visit: Payer: Self-pay | Admitting: Nurse Practitioner

## 2016-10-16 DIAGNOSIS — N183 Chronic kidney disease, stage 3 unspecified: Secondary | ICD-10-CM

## 2017-01-22 ENCOUNTER — Ambulatory Visit (INDEPENDENT_AMBULATORY_CARE_PROVIDER_SITE_OTHER): Payer: Medicaid Other | Admitting: Nurse Practitioner

## 2017-01-22 ENCOUNTER — Encounter: Payer: Self-pay | Admitting: Nurse Practitioner

## 2017-01-22 VITALS — BP 108/64 | HR 61 | Temp 98.7°F | Ht 70.0 in | Wt 164.0 lb

## 2017-01-22 DIAGNOSIS — I25119 Atherosclerotic heart disease of native coronary artery with unspecified angina pectoris: Secondary | ICD-10-CM | POA: Diagnosis not present

## 2017-01-22 DIAGNOSIS — I10 Essential (primary) hypertension: Secondary | ICD-10-CM

## 2017-01-22 DIAGNOSIS — F5101 Primary insomnia: Secondary | ICD-10-CM | POA: Diagnosis not present

## 2017-01-22 DIAGNOSIS — E1122 Type 2 diabetes mellitus with diabetic chronic kidney disease: Secondary | ICD-10-CM

## 2017-01-22 DIAGNOSIS — E782 Mixed hyperlipidemia: Secondary | ICD-10-CM

## 2017-01-22 DIAGNOSIS — N183 Chronic kidney disease, stage 3 unspecified: Secondary | ICD-10-CM

## 2017-01-22 LAB — BAYER DCA HB A1C WAIVED: HB A1C (BAYER DCA - WAIVED): 5 % (ref <7.0)

## 2017-01-22 MED ORDER — ATORVASTATIN CALCIUM 40 MG PO TABS: 40.0000 mg | ORAL_TABLET | Freq: Every day | ORAL | 1 refills | Status: DC

## 2017-01-22 MED ORDER — METOPROLOL TARTRATE 25 MG PO TABS: 25.0000 mg | ORAL_TABLET | Freq: Two times a day (BID) | ORAL | 1 refills | Status: DC

## 2017-01-22 MED ORDER — TRICOR 145 MG PO TABS: 145.0000 mg | ORAL_TABLET | Freq: Every day | ORAL | 1 refills | Status: DC

## 2017-01-22 MED ORDER — METFORMIN HCL 500 MG PO TABS: 500.0000 mg | ORAL_TABLET | Freq: Two times a day (BID) | ORAL | 1 refills | Status: DC

## 2017-01-22 MED ORDER — LISINOPRIL 20 MG PO TABS: 20.0000 mg | ORAL_TABLET | Freq: Every day | ORAL | 1 refills | Status: DC

## 2017-01-22 MED ORDER — TRAZODONE HCL 100 MG PO TABS: 100.0000 mg | ORAL_TABLET | Freq: Every day | ORAL | 1 refills | Status: DC

## 2017-01-22 NOTE — Progress Notes (Signed)
 Subjective:    Patient ID: Edward [name], male    DOB: 08/22/1950, 66 y.o.   MRN: [id number]  HPI  Edward [name] is here today for follow up of chronic medical problem.  Outpatient Encounter Prescriptions as of 01/22/2017  Medication Sig  . ACCU-CHEK AVIVA PLUS test strip USE TO CHECK GLUCOSE ONCE DAILY  . aspirin 81 MG tablet Take 81 mg by mouth daily.    . atorvastatin (LIPITOR) 40 MG tablet Take 1 tablet (40 mg total) by mouth daily.  . brimonidine (ALPHAGAN) 0.15 % ophthalmic solution Place 1 drop into both eyes 2 (two) times daily.  . Cholecalciferol (VITAMIN D3) 2000 UNITS TABS Take 1 tablet by mouth daily.  . fish oil-omega-3 fatty acids 1000 MG capsule Take 1 g by mouth 2 (two) times daily.    . lisinopril (PRINIVIL,ZESTRIL) 20 MG tablet Take 1 tablet (20 mg total) by mouth daily.  . metFORMIN (GLUCOPHAGE) 500 MG tablet Take 1 tablet (500 mg total) by mouth 2 (two) times daily with a meal.  . metoprolol tartrate (LOPRESSOR) 25 MG tablet Take 1 tablet (25 mg total) by mouth 2 (two) times daily.  . nitroGLYCERIN (NITROSTAT) 0.4 MG SL tablet Place 1 tablet (0.4 mg total) under the tongue every 5 (five) minutes as needed.  . sitaGLIPtin (JANUVIA) 100 MG tablet Take 1 tablet (100 mg total) by mouth daily.  . traZODone (DESYREL) 100 MG tablet Take 1 tablet (100 mg total) by mouth daily with breakfast.  . TRICOR 145 MG tablet Take 1 tablet (145 mg total) by mouth daily.   No facility-administered encounter medications on file as of 01/22/2017.     1. Type 2 diabetes mellitus with stage 3 chronic kidney disease, without long-term current use of insulin (HCC) last hgba1c was 6.1%. He does not check his blood sugar very often. No symptoms of hypoglycemia.  2. Essential hypertension  No c/o chest pain, SOB, and headache. Does not check blood pressures at home.  3. Mixed hyperlipidemia  Does not watch diet  4. Atherosclerosis of native coronary artery of native heart with angina pectoris  (HCC)  Last visit with cardiology was on 07/20/16 and he is suppose to follow back up with him this month.  5. CKD (chronic kidney disease) stage 3, GFR 30-59 ml/min  We are currently just monitoring labs  6. Primary insomnia  Takes trazodone at night to sleep. Says it works fairly well.    New complaints: None today  Social history: Lives at home with wife    Review of Systems  Constitutional: Negative for activity change and appetite change.  HENT: Negative.   Eyes: Negative for pain.  Respiratory: Negative for shortness of breath.   Cardiovascular: Negative for chest pain, palpitations and leg swelling.  Gastrointestinal: Negative for abdominal pain.  Endocrine: Negative for polydipsia.  Genitourinary: Negative.   Skin: Negative for rash.  Neurological: Negative for dizziness, weakness and headaches.  Hematological: Does not bruise/bleed easily.  Psychiatric/Behavioral: Negative.   All other systems reviewed and are negative.      Objective:   Physical Exam  Constitutional: He is oriented to person, place, and time. He appears well-developed and well-nourished.  HENT:  Head: Normocephalic.  Right Ear: External ear normal.  Left Ear: External ear normal.  Nose: Nose normal.  Mouth/Throat: Oropharynx is clear and moist.  Eyes: Pupils are equal, round, and reactive to light. EOM are normal.  Neck: Normal range of motion. Neck supple. No JVD present.   No thyromegaly present.  Cardiovascular: Normal rate, regular rhythm, normal heart sounds and intact distal pulses.  Exam reveals no gallop and no friction rub.   No murmur heard. Pulmonary/Chest: Effort normal and breath sounds normal. No respiratory distress. He has no wheezes. He has no rales. He exhibits no tenderness.  Abdominal: Soft. Bowel sounds are normal. He exhibits no mass. There is no tenderness.  Genitourinary: Prostate normal and penis normal.  Musculoskeletal: Normal range of motion. He exhibits no edema.    Lymphadenopathy:    He has no cervical adenopathy.  Neurological: He is alert and oriented to person, place, and time. No cranial nerve deficit.  Skin: Skin is warm and dry.  Psychiatric: He has a normal mood and affect. His behavior is normal. Judgment and thought content normal.   BP 108/64   Pulse 61   Temp 98.7 F (37.1 C) (Oral)   Ht 5' 10" (1.778 m)   Wt 164 lb (74.4 kg)   SpO2 98%   BMI 23.53 kg/m   HGBA1C- 5.0%    Assessment & Plan:  1. Type 2 diabetes mellitus with stage 3 chronic kidney disease, without long-term current use of insulin (HCC) cntinue to watch carbsin diet Since HGBA1C is so good we are going  To hold januvia for the next 3  months - Bayer DCA Hb A1c Waived - metFORMIN (GLUCOPHAGE) 500 MG tablet; Take 1 tablet (500 mg total) by mouth 2 (two) times daily with a meal.  Dispense: 180 tablet; Refill: 1  2. Essential hypertension Low sodium diet - CMP14+EGFR - metoprolol tartrate (LOPRESSOR) 25 MG tablet; Take 1 tablet (25 mg total) by mouth 2 (two) times daily.  Dispense: 180 tablet; Refill: 1 - lisinopril (PRINIVIL,ZESTRIL) 20 MG tablet; Take 1 tablet (20 mg total) by mouth daily.  Dispense: 90 tablet; Refill: 1  3. Mixed hyperlipidemia Low fat diet - Lipid panel - atorvastatin (LIPITOR) 40 MG tablet; Take 1 tablet (40 mg total) by mouth daily.  Dispense: 90 tablet; Refill: 1 - TRICOR 145 MG tablet; Take 1 tablet (145 mg total) by mouth daily.  Dispense: 90 tablet; Refill: 1  4. Atherosclerosis of native coronary artery of native heart with angina pectoris [facility]) Keep follow up with cardiology  5. CKD (chronic kidney disease) stage 3, GFR 30-59 ml/min Labs pending  6. Primary insomnia Bedtime routine reviewed - traZODone (DESYREL) 100 MG tablet; Take 1 tablet (100 mg total) by mouth daily with breakfast.  Dispense: 90 tablet; Refill: 1    Labs pending Health maintenance reviewed Diet and exercise encouraged Continue all meds Follow up  In 3  months   [place], FNP

## 2017-01-22 NOTE — Patient Instructions (Signed)

## 2017-01-23 LAB — LIPID PANEL
CHOLESTEROL TOTAL: 116 mg/dL (ref 100–199)
Chol/HDL Ratio: 5.5 ratio — ABNORMAL HIGH (ref 0.0–5.0)
HDL: 21 mg/dL — AB (ref >39)
LDL CALC: 56 mg/dL (ref 0–99)
TRIGLYCERIDES: 194 mg/dL — AB (ref 0–149)
VLDL CHOLESTEROL CAL: 39 mg/dL (ref 5–40)

## 2017-01-23 LAB — CMP14+EGFR
ALK PHOS: 31 IU/L — AB (ref 39–117)
ALT: 30 IU/L (ref 0–44)
AST: 40 IU/L (ref 0–40)
Albumin/Globulin Ratio: 1.9 (ref 1.2–2.2)
Albumin: 4.2 g/dL (ref 3.6–4.8)
BUN/Creatinine Ratio: 11 (ref 10–24)
BUN: 21 mg/dL (ref 8–27)
Bilirubin Total: 0.4 mg/dL (ref 0.0–1.2)
CO2: 23 mmol/L (ref 20–29)
CREATININE: 1.91 mg/dL — AB (ref 0.76–1.27)
Calcium: 9 mg/dL (ref 8.6–10.2)
Chloride: 105 mmol/L (ref 96–106)
GFR calc Af Amer: 42 mL/min/{1.73_m2} — ABNORMAL LOW (ref >59)
GFR calc non Af Amer: 36 mL/min/{1.73_m2} — ABNORMAL LOW (ref >59)
Globulin, Total: 2.2 g/dL (ref 1.5–4.5)
Glucose: 98 mg/dL (ref 65–99)
Potassium: 5.1 mmol/L (ref 3.5–5.2)
Sodium: 140 mmol/L (ref 134–144)
Total Protein: 6.4 g/dL (ref 6.0–8.5)

## 2017-01-28 ENCOUNTER — Ambulatory Visit (INDEPENDENT_AMBULATORY_CARE_PROVIDER_SITE_OTHER): Payer: Medicaid Other | Admitting: Cardiology

## 2017-01-28 ENCOUNTER — Encounter: Payer: Self-pay | Admitting: Cardiology

## 2017-01-28 VITALS — BP 126/76 | HR 58 | Ht 70.0 in | Wt 160.0 lb

## 2017-01-28 DIAGNOSIS — Z23 Encounter for immunization: Secondary | ICD-10-CM

## 2017-01-28 DIAGNOSIS — E119 Type 2 diabetes mellitus without complications: Secondary | ICD-10-CM | POA: Diagnosis not present

## 2017-01-28 DIAGNOSIS — I10 Essential (primary) hypertension: Secondary | ICD-10-CM

## 2017-01-28 DIAGNOSIS — I25119 Atherosclerotic heart disease of native coronary artery with unspecified angina pectoris: Secondary | ICD-10-CM | POA: Diagnosis not present

## 2017-01-28 DIAGNOSIS — E782 Mixed hyperlipidemia: Secondary | ICD-10-CM

## 2017-01-28 DIAGNOSIS — N183 Chronic kidney disease, stage 3 unspecified: Secondary | ICD-10-CM

## 2017-01-28 MED ORDER — NITROGLYCERIN 0.4 MG SL SUBL: 0.4000 mg | SUBLINGUAL_TABLET | SUBLINGUAL | 3 refills | Status: DC | PRN

## 2017-01-28 NOTE — Patient Instructions (Signed)

## 2017-01-28 NOTE — Progress Notes (Signed)
Cardiology Office Note  Date: 01/28/2017   ID: Edward [name], DOB 12-03-1950, MRN [id number]  PCP: [name], FNP  Primary Cardiologist: Rozann Lesches, MD   Chief Complaint  Patient presents with  . Coronary Artery Disease    History of Present Illness: Edward [name] is a 66 y.o. male last seen in March. He presents today with his wife for a follow-up visit. Since last encounter he reports no change in stamina, no angina symptoms, stable NYHA class II dyspnea. He has had no palpitations or syncope. He has not used any nitroglycerin.  I reviewed his most recent lab work as outlined below. LDL was 56 and he continues on Lipitor without intolerances. Triglycerides were increased at 194 and he also remains on TriCor as well as omega-3 supplements.  Myoview from 2016 is reviewed below, low risk findings. We went over his medications which are stable from a cardiac perspective.  He continues to follow with [facility] for primary care.  Past Medical History:  Diagnosis Date  . Cataract   . CKD (chronic kidney disease) stage 3, GFR 30-59 ml/min   . Coronary atherosclerosis of native coronary artery    BMS LAD 2006; residual 75% distal RCA; EF 50%  . Essential hypertension, benign   . Glaucoma   . Low back pain   . MI (myocardial infarction) ([place])    Anterior 2006  . Mixed hyperlipidemia   . Type 2 diabetes mellitus ([place])     Past Surgical History:  Procedure Laterality Date  . CORONARY ANGIOPLASTY WITH STENT PLACEMENT  2006    Current Outpatient Prescriptions  Medication Sig Dispense Refill  . ACCU-CHEK AVIVA PLUS test strip USE TO CHECK GLUCOSE ONCE DAILY 100 each 9  . aspirin 81 MG tablet Take 81 mg by mouth daily.      [name] atorvastatin (LIPITOR) 40 MG tablet Take 1 tablet (40 mg total) by mouth daily. 90 tablet 1  . brimonidine (ALPHAGAN) 0.15 % ophthalmic solution Place 1 drop into both eyes 2 (two) times daily.    . Cholecalciferol (VITAMIN D3) 2000 UNITS TABS  Take 1 tablet by mouth daily.    . fish oil-omega-3 fatty acids 1000 MG capsule Take 1 g by mouth 2 (two) times daily.      [name] lisinopril (PRINIVIL,ZESTRIL) 20 MG tablet Take 1 tablet (20 mg total) by mouth daily. 90 tablet 1  . metFORMIN (GLUCOPHAGE) 500 MG tablet Take 1 tablet (500 mg total) by mouth 2 (two) times daily with a meal. 180 tablet 1  . metoprolol tartrate (LOPRESSOR) 25 MG tablet Take 1 tablet (25 mg total) by mouth 2 (two) times daily. 180 tablet 1  . nitroGLYCERIN (NITROSTAT) 0.4 MG SL tablet Place 1 tablet (0.4 mg total) under the tongue every 5 (five) minutes as needed. 25 tablet 3  . traZODone (DESYREL) 100 MG tablet Take 1 tablet (100 mg total) by mouth daily with breakfast. 90 tablet 1  . TRICOR 145 MG tablet Take 1 tablet (145 mg total) by mouth daily. 90 tablet 1   No current facility-administered medications for this visit.    Allergies:  Patient has no known allergies.   Social History: The patient  reports that he quit smoking about 29 years ago. His smoking use included Cigarettes. He has a 40.00 pack-year smoking history. He has never used smokeless tobacco. He reports that he does not drink alcohol or use drugs.   ROS:  Please see the history of present illness.  Otherwise, complete review of systems is positive for none.  All other systems are reviewed and negative.   Physical Exam: VS:  BP 126/76   Pulse (!) 58   Ht 5\' 10"  (1.778 m)   Wt 160 lb (72.6 kg)   SpO2 98%   BMI 22.96 kg/m , BMI Body mass index is 22.96 kg/m.  Wt Readings from Last 3 Encounters:  01/28/17 160 lb (72.6 kg)  01/22/17 164 lb (74.4 kg)  10/12/16 164 lb 12.8 oz (74.8 kg)    General: Patient appears comfortable at rest. HEENT: Conjunctiva and lids normal, oropharynx clear. Neck: Supple, no elevated JVP or carotid bruits, no thyromegaly. Lungs: Clear to auscultation, nonlabored breathing at rest. Cardiac: Regular rate and rhythm, no S3 or significant systolic murmur, no pericardial  rub. Abdomen: Soft, nontender, bowel sounds present, no guarding or rebound. Extremities: No pitting edema, distal pulses 2+. Skin: Warm and dry. Musculoskeletal: No kyphosis. Neuropsychiatric: Alert and oriented x3, affect grossly appropriate.  ECG: I personally reviewed the tracing from 10/27/2015 which shows sinus bradycardia with decreased R wave progression and low voltage in the limb leads.  Recent Labwork: 01/22/2017: ALT 30; AST 40; BUN 21; Creatinine, Ser 1.91; Potassium 5.1; Sodium 140     Component Value Date/Time   CHOL 116 01/22/2017 0810   CHOL 177 09/25/2012 1015   TRIG 194 (H) 01/22/2017 0810   TRIG 206 (H) 10/07/2014 0927   TRIG 269 (H) 09/25/2012 1015   HDL 21 (L) 01/22/2017 0810   HDL 22 (L) 10/07/2014 0927   HDL 28 (L) 09/25/2012 1015   CHOLHDL 5.5 (H) 01/22/2017 0810   LDLCALC 56 01/22/2017 0810   LDLCALC 98 12/21/2013 0850   LDLCALC 95 09/25/2012 1015    Other Studies Reviewed Today:  [facility] Cardiolite 09/13/2014:  This is a low risk study.  Findings consistent with prior myocardial infarction.  Defect 1: There is a medium defect of moderate severity present in the apical anterior and apex location.  Assessment and Plan:  1. CAD with history of BMS to the LAD in 2006 and moderate residual distal RCA disease that has been managed medically. Cardiolite from 2016 was low risk as outlined above. He does not report any progressive angina at this time on medical therapy and we will continue with observation. Refill provided for as needed nitroglycerin.  2. Mixed hyperlipidemia. He continues on Lipitor, TriCor, and omega-3 supplements. Recent lab work reviewed. LDL well controlled 56. Triglycerides have come down as well.  3. CKD stage III, creatinine stable at 1.9.  4. Essential hypertension, on lisinopril and Lopressor. Blood pressure is adequately controlled today.  5. Type 2 diabetes mellitus, on Glucophage. Recent hemoglobin A1c down to 5.0.  Current  medicines were reviewed with the patient today.   Orders Placed This Encounter  Procedures  . EKG 12-Lead    Disposition: Follow-up in 6 months.  Signed, Satira Sark, MD, [facility] 01/28/2017 8:45 AM    [facility] [address], [place], [place] [address] Phone: [phone]; Fax: [phone]

## 2017-04-25 ENCOUNTER — Encounter: Payer: Self-pay | Admitting: Nurse Practitioner

## 2017-04-25 ENCOUNTER — Ambulatory Visit: Payer: Medicaid Other | Admitting: Nurse Practitioner

## 2017-04-25 VITALS — BP 109/71 | HR 50 | Temp 97.4°F | Ht 70.0 in | Wt 165.0 lb

## 2017-04-25 DIAGNOSIS — I25119 Atherosclerotic heart disease of native coronary artery with unspecified angina pectoris: Secondary | ICD-10-CM

## 2017-04-25 DIAGNOSIS — E782 Mixed hyperlipidemia: Secondary | ICD-10-CM

## 2017-04-25 DIAGNOSIS — N183 Chronic kidney disease, stage 3 unspecified: Secondary | ICD-10-CM

## 2017-04-25 DIAGNOSIS — I10 Essential (primary) hypertension: Secondary | ICD-10-CM | POA: Diagnosis not present

## 2017-04-25 DIAGNOSIS — E1122 Type 2 diabetes mellitus with diabetic chronic kidney disease: Secondary | ICD-10-CM

## 2017-04-25 DIAGNOSIS — F5101 Primary insomnia: Secondary | ICD-10-CM

## 2017-04-25 LAB — CMP14+EGFR
A/G RATIO: 1.8 (ref 1.2–2.2)
ALT: 28 IU/L (ref 0–44)
AST: 35 IU/L (ref 0–40)
Albumin: 4.3 g/dL (ref 3.6–4.8)
Alkaline Phosphatase: 37 IU/L — ABNORMAL LOW (ref 39–117)
BILIRUBIN TOTAL: 0.4 mg/dL (ref 0.0–1.2)
BUN/Creatinine Ratio: 9 — ABNORMAL LOW (ref 10–24)
BUN: 16 mg/dL (ref 8–27)
CALCIUM: 9.7 mg/dL (ref 8.6–10.2)
CHLORIDE: 105 mmol/L (ref 96–106)
CO2: 20 mmol/L (ref 20–29)
Creatinine, Ser: 1.76 mg/dL — ABNORMAL HIGH (ref 0.76–1.27)
GFR calc Af Amer: 46 mL/min/{1.73_m2} — ABNORMAL LOW (ref >59)
GFR, EST NON AFRICAN AMERICAN: 40 mL/min/{1.73_m2} — AB (ref >59)
GLUCOSE: 132 mg/dL — AB (ref 65–99)
Globulin, Total: 2.4 g/dL (ref 1.5–4.5)
POTASSIUM: 5.5 mmol/L — AB (ref 3.5–5.2)
Sodium: 141 mmol/L (ref 134–144)
Total Protein: 6.7 g/dL (ref 6.0–8.5)

## 2017-04-25 LAB — LIPID PANEL
Chol/HDL Ratio: 4.9 ratio (ref 0.0–5.0)
Cholesterol, Total: 122 mg/dL (ref 100–199)
HDL: 25 mg/dL — AB (ref >39)
LDL Calculated: 63 mg/dL (ref 0–99)
TRIGLYCERIDES: 171 mg/dL — AB (ref 0–149)
VLDL CHOLESTEROL CAL: 34 mg/dL (ref 5–40)

## 2017-04-25 LAB — BAYER DCA HB A1C WAIVED: HB A1C: 7.9 % — AB (ref <7.0)

## 2017-04-25 MED ORDER — TRICOR 145 MG PO TABS: 145.0000 mg | ORAL_TABLET | Freq: Every day | ORAL | 1 refills | Status: DC

## 2017-04-25 MED ORDER — TRAZODONE HCL 100 MG PO TABS: 100.0000 mg | ORAL_TABLET | Freq: Every day | ORAL | 1 refills | Status: DC

## 2017-04-25 MED ORDER — METFORMIN HCL 500 MG PO TABS: 500.0000 mg | ORAL_TABLET | Freq: Two times a day (BID) | ORAL | 1 refills | Status: DC

## 2017-04-25 MED ORDER — LISINOPRIL 20 MG PO TABS
20.0000 mg | ORAL_TABLET | Freq: Every day | ORAL | 1 refills | Status: DC
Start: 2017-04-25 — End: 2017-07-26

## 2017-04-25 MED ORDER — METOPROLOL TARTRATE 25 MG PO TABS: 25.0000 mg | ORAL_TABLET | Freq: Two times a day (BID) | ORAL | 1 refills | Status: DC

## 2017-04-25 MED ORDER — ATORVASTATIN CALCIUM 40 MG PO TABS: 40.0000 mg | ORAL_TABLET | Freq: Every day | ORAL | 1 refills | Status: DC

## 2017-04-25 NOTE — Progress Notes (Signed)
Subjective:    Patient ID: Edward [name], male    DOB: 1950-12-01, 66 y.o.   MRN: [id number]  HPI  Edward [name] [facility] is here today for follow up of chronic medical problem.  Outpatient Encounter Medications as of 04/25/2017  Medication Sig  . ACCU-CHEK AVIVA PLUS test strip USE TO CHECK GLUCOSE ONCE DAILY  . aspirin 81 MG tablet Take 81 mg by mouth daily.    [name] atorvastatin (LIPITOR) 40 MG tablet Take 1 tablet (40 mg total) by mouth daily.  . brimonidine (ALPHAGAN) 0.15 % ophthalmic solution Place 1 drop into both eyes 2 (two) times daily.  . Cholecalciferol (VITAMIN D3) 2000 UNITS TABS Take 1 tablet by mouth daily.  . fish oil-omega-3 fatty acids 1000 MG capsule Take 1 g by mouth 2 (two) times daily.    [name] lisinopril (PRINIVIL,ZESTRIL) 20 MG tablet Take 1 tablet (20 mg total) by mouth daily.  . metFORMIN (GLUCOPHAGE) 500 MG tablet Take 1 tablet (500 mg total) by mouth 2 (two) times daily with a meal.  . metoprolol tartrate (LOPRESSOR) 25 MG tablet Take 1 tablet (25 mg total) by mouth 2 (two) times daily.  . nitroGLYCERIN (NITROSTAT) 0.4 MG SL tablet Place 1 tablet (0.4 mg total) under the tongue every 5 (five) minutes as needed.  . traZODone (DESYREL) 100 MG tablet Take 1 tablet (100 mg total) by mouth daily with breakfast.  . TRICOR 145 MG tablet Take 1 tablet (145 mg total) by mouth daily.     1. Type 2 diabetes mellitus with stage 3 chronic kidney disease, without long-term current use of insulin ([place]) last hgba1c was 5.0%. Blood sugars have been averaging around 140. No hypoglycemia  2. Essential hypertension  No chest pain ,SOB or headache. Does not check blood pressure at home. BP Readings from Last 3 Encounters:  04/25/17 109/71  01/28/17 126/76  01/22/17 108/64     3. Mixed hyperlipidemia  Does not watch diet veery closely  4. CKD (chronic kidney disease) stage 3, GFR 30-59 ml/min (HCC)  Currently watching labs  5. Primary insomnia  trazadone works well to help him to  sleep at night  6. Atherosclerosis of native coronary artery of native heart with angina pectoris [facility])  saw cardiologist in September and no changes were made to plan of care    New complaints: None today  Social history: Retired- lives by himself    Review of Systems  Constitutional: Negative for activity change and appetite change.  HENT: Negative.   Eyes: Negative for pain.  Respiratory: Negative for shortness of breath.   Cardiovascular: Negative for chest pain, palpitations and leg swelling.  Gastrointestinal: Negative for abdominal pain.  Endocrine: Negative for polydipsia.  Genitourinary: Negative.   Skin: Negative for rash.  Neurological: Negative for dizziness, weakness and headaches.  Hematological: Does not bruise/bleed easily.  Psychiatric/Behavioral: Negative.   All other systems reviewed and are negative.      Objective:   Physical Exam  Constitutional: He is oriented to person, place, and time. He appears well-developed and well-nourished.  HENT:  Head: Normocephalic.  Right Ear: External ear normal.  Left Ear: External ear normal.  Nose: Nose normal.  Mouth/Throat: Oropharynx is clear and moist.  Eyes: EOM are normal. Pupils are equal, round, and reactive to light.  Neck: Normal range of motion. Neck supple. No JVD present. No thyromegaly present.  Cardiovascular: Normal rate, regular rhythm, normal heart sounds and intact distal pulses. Exam reveals no gallop and no  friction rub.  No murmur heard. Pulmonary/Chest: Effort normal and breath sounds normal. No respiratory distress. He has no wheezes. He has no rales. He exhibits no tenderness.  Abdominal: Soft. Bowel sounds are normal. He exhibits no mass. There is no tenderness.  Genitourinary: Prostate normal and penis normal.  Musculoskeletal: Normal range of motion. He exhibits no edema.  Lymphadenopathy:    He has no cervical adenopathy.  Neurological: He is alert and oriented to person, place, and  time. No cranial nerve deficit.  Skin: Skin is warm and dry.  Psychiatric: He has a normal mood and affect. His behavior is normal. Judgment and thought content normal.    BP 109/71   Pulse (!) 50   Temp (!) 97.4 F (36.3 C) (Oral)   Ht _0  (1.778 m)   Wt 165 lb (74.8 kg)   BMI 23.68 kg/m   hgba1c- 7.9%     Assessment & Plan:  1. Type 2 diabetes mellitus with stage 3 chronic kidney disease, without long-term current use of insulin (HCC) Strict low carb diet Making no chnages because when patient watches diet he does well - Bayer DCA Hb A1c Waived  2. Essential hypertension Low sodium diet - CMP14+EGFR  3. Mixed hyperlipidemia Low fat diet - Lipid panel  4. CKD (chronic kidney disease) stage 3, GFR 30-59 ml/min (HCC) Labs pendning  5. Primary insomnia Bedtime routine  6. Atherosclerosis of native coronary artery of native heart with angina pectoris [facility]) Keep follow up appointment with cardiology  Meds ordered this encounter  Medications  . metoprolol tartrate (LOPRESSOR) 25 MG tablet    Sig: Take 1 tablet (25 mg total) by mouth 2 (two) times daily.    Dispense:  180 tablet    Refill:  1    Please consider 90 day supplies to promote better adherence    Order Specific Question:   Supervising Provider    Answer:   Evette Doffing, CAROL L [[id number]]  . lisinopril (PRINIVIL,ZESTRIL) 20 MG tablet    Sig: Take 1 tablet (20 mg total) by mouth daily.    Dispense:  90 tablet    Refill:  1    Please consider 90 day supplies to promote better adherence    Order Specific Question:   Supervising Provider    Answer:   Evette Doffing, CAROL L [[id number]]  . atorvastatin (LIPITOR) 40 MG tablet    Sig: Take 1 tablet (40 mg total) by mouth daily.    Dispense:  90 tablet    Refill:  1    Order Specific Question:   Supervising Provider    Answer:   VINCENT, CAROL L [[id number]]  . TRICOR 145 MG tablet    Sig: Take 1 tablet (145 mg total) by mouth daily.    Dispense:  90 tablet    Refill:  1     Order Specific Question:   Supervising Provider    Answer:   VINCENT, CAROL L [[id number]]  . traZODone (DESYREL) 100 MG tablet    Sig: Take 1 tablet (100 mg total) by mouth daily with breakfast.    Dispense:  90 tablet    Refill:  1    Please consider 90 day supplies to promote better adherence    Order Specific Question:   Supervising Provider    Answer:   Evette Doffing, CAROL L [[id number]]  . metFORMIN (GLUCOPHAGE) 500 MG tablet    Sig: Take 1 tablet (500 mg total) by mouth 2 (two) times daily with a meal.  Dispense:  180 tablet    Refill:  1    Order Specific Question:   Supervising Provider    Answer:   [place] pending Health maintenance reviewed Diet and exercise encouraged Continue all meds Follow up  In 3 months   [place], FNP

## 2017-05-12 ENCOUNTER — Other Ambulatory Visit: Payer: Self-pay | Admitting: Nurse Practitioner

## 2017-05-13 NOTE — Telephone Encounter (Signed)
Please call patient and find out if he has been taking

## 2017-05-14 ENCOUNTER — Other Ambulatory Visit: Payer: Self-pay | Admitting: Nurse Practitioner

## 2017-05-15 NOTE — Telephone Encounter (Signed)
lmtcb

## 2017-05-16 ENCOUNTER — Telehealth: Payer: Self-pay

## 2017-05-16 ENCOUNTER — Telehealth: Payer: Self-pay | Admitting: Nurse Practitioner

## 2017-05-16 DIAGNOSIS — E782 Mixed hyperlipidemia: Secondary | ICD-10-CM

## 2017-05-16 MED ORDER — GEMFIBROZIL 600 MG PO TABS: 600.0000 mg | ORAL_TABLET | Freq: Two times a day (BID) | ORAL | 1 refills | Status: DC

## 2017-05-16 NOTE — Telephone Encounter (Signed)
Medicaid non preferred [place]  Preferred are fenofibrate and gemfibrozil tab

## 2017-05-16 NOTE — Telephone Encounter (Signed)
Spoke with wife and explained change in medication. Patients wife verbalized understanding. Confirmed change with [facility]

## 2017-05-16 NOTE — Telephone Encounter (Signed)
Changed tricor to gemfibrozol-rx sent to pharmacy

## 2017-05-17 NOTE — Telephone Encounter (Signed)
Please call to inform us if you are currently taking glipizide and need a refill.

## 2017-07-09 ENCOUNTER — Other Ambulatory Visit: Payer: Self-pay | Admitting: Nurse Practitioner

## 2017-07-26 ENCOUNTER — Ambulatory Visit: Payer: Medicaid Other | Admitting: Nurse Practitioner

## 2017-07-26 ENCOUNTER — Encounter: Payer: Self-pay | Admitting: Nurse Practitioner

## 2017-07-26 VITALS — BP 125/78 | HR 73 | Temp 97.0°F | Ht 70.0 in | Wt 163.0 lb

## 2017-07-26 DIAGNOSIS — E782 Mixed hyperlipidemia: Secondary | ICD-10-CM

## 2017-07-26 DIAGNOSIS — N183 Chronic kidney disease, stage 3 unspecified: Secondary | ICD-10-CM

## 2017-07-26 DIAGNOSIS — F5101 Primary insomnia: Secondary | ICD-10-CM | POA: Diagnosis not present

## 2017-07-26 DIAGNOSIS — E1122 Type 2 diabetes mellitus with diabetic chronic kidney disease: Secondary | ICD-10-CM

## 2017-07-26 DIAGNOSIS — I25119 Atherosclerotic heart disease of native coronary artery with unspecified angina pectoris: Secondary | ICD-10-CM | POA: Diagnosis not present

## 2017-07-26 DIAGNOSIS — I10 Essential (primary) hypertension: Secondary | ICD-10-CM

## 2017-07-26 LAB — BAYER DCA HB A1C WAIVED: HB A1C (BAYER DCA - WAIVED): 8.1 % — ABNORMAL HIGH (ref <7.0)

## 2017-07-26 MED ORDER — METFORMIN HCL 1000 MG PO TABS: 1000.0000 mg | ORAL_TABLET | Freq: Two times a day (BID) | ORAL | 1 refills | Status: DC

## 2017-07-26 MED ORDER — GLIPIZIDE 10 MG PO TABS: ORAL_TABLET | ORAL | 5 refills | Status: DC

## 2017-07-26 MED ORDER — METOPROLOL TARTRATE 25 MG PO TABS: 25.0000 mg | ORAL_TABLET | Freq: Two times a day (BID) | ORAL | 1 refills | Status: DC

## 2017-07-26 MED ORDER — TRAZODONE HCL 100 MG PO TABS: 100.0000 mg | ORAL_TABLET | Freq: Every day | ORAL | 1 refills | Status: DC

## 2017-07-26 MED ORDER — LISINOPRIL 20 MG PO TABS: 20.0000 mg | ORAL_TABLET | Freq: Every day | ORAL | 1 refills | Status: DC

## 2017-07-26 MED ORDER — ATORVASTATIN CALCIUM 40 MG PO TABS: 40.0000 mg | ORAL_TABLET | Freq: Every day | ORAL | 1 refills | Status: DC

## 2017-07-26 NOTE — Progress Notes (Signed)
Subjective:    Patient ID: Edward [name], male    DOB: 1950-12-23, 67 y.o.   MRN: [id number]  HPI  Ameen Mostafa [facility] is here today for follow up of chronic medical problem.  Outpatient Encounter Medications as of 07/26/2017  Medication Sig  . ACCU-CHEK AVIVA PLUS test strip USE TO CHECK BLOOD SUGAR ONCE DAILY  . aspirin 81 MG tablet Take 81 mg by mouth daily.    [name] atorvastatin (LIPITOR) 40 MG tablet Take 1 tablet (40 mg total) by mouth daily.  . brimonidine (ALPHAGAN) 0.15 % ophthalmic solution Place 1 drop into both eyes 2 (two) times daily.  . Cholecalciferol (VITAMIN D3) 2000 UNITS TABS Take 1 tablet by mouth daily.  . fish oil-omega-3 fatty acids 1000 MG capsule Take 1 g by mouth 2 (two) times daily.    [name] gemfibrozil (LOPID) 600 MG tablet Take 1 tablet (600 mg total) by mouth 2 (two) times daily before a meal.  . glipiZIDE (GLUCOTROL) 10 MG tablet TAKE 1 TABLET BY MOUTH TWICE DAILY BEFORE MEAL(S)  . lisinopril (PRINIVIL,ZESTRIL) 20 MG tablet Take 1 tablet (20 mg total) by mouth daily.  . metFORMIN (GLUCOPHAGE) 500 MG tablet Take 1 tablet (500 mg total) by mouth 2 (two) times daily with a meal.  . metoprolol tartrate (LOPRESSOR) 25 MG tablet Take 1 tablet (25 mg total) by mouth 2 (two) times daily.  . nitroGLYCERIN (NITROSTAT) 0.4 MG SL tablet Place 1 tablet (0.4 mg total) under the tongue every 5 (five) minutes as needed.  . traZODone (DESYREL) 100 MG tablet Take 1 tablet (100 mg total) by mouth daily with breakfast.     1. Atherosclerosis of native coronary artery of native heart with angina pectoris [facility])  patient saw Dr. Domenic Polite. Patient stable only change made as cut lisinopril 20 mg down to 63m.  2. Essential hypertension  No c/o chest pain, sob or headache. Does not check blood pressure at home. BP Readings from Last 3 Encounters:  04/25/17 109/71  01/28/17 126/76  01/22/17 108/64     3. Type 2 diabetes mellitus with stage 3 chronic kidney disease, without long-term  current use of insulin (HBolivar last hgba1c was 7.9%. He does ot check blood sugar daily. Patint says that when he checks fasting blood sugar that is always below 120.   4. CKD (chronic kidney disease) stage 3, GFR 30-59 ml/min (HCC) currently just watching labs  5. Primary insomnia  Patient takes trazadone nightly to sleep. Says he is sleping well.  6. Mixed hyperlipidemia  Patient admits to not watching diet    New complaints: None today  Social history: Retired- lives by himself     Review of Systems  Constitutional: Negative for activity change and appetite change.  HENT: Negative.   Eyes: Negative for pain.  Respiratory: Negative for shortness of breath.   Cardiovascular: Negative for chest pain, palpitations and leg swelling.  Gastrointestinal: Negative for abdominal pain.  Endocrine: Negative for polydipsia.  Genitourinary: Negative.   Skin: Negative for rash.  Neurological: Negative for dizziness, weakness and headaches.  Hematological: Does not bruise/bleed easily.  Psychiatric/Behavioral: Negative.   All other systems reviewed and are negative.      Objective:   Physical Exam  Constitutional: He is oriented to person, place, and time. He appears well-developed and well-nourished.  HENT:  Head: Normocephalic.  Right Ear: External ear normal.  Left Ear: External ear normal.  Nose: Nose normal.  Mouth/Throat: Oropharynx is clear and moist.  Eyes:  Pupils are equal, round, and reactive to light. EOM are normal.  Neck: Normal range of motion. Neck supple. No JVD present. No thyromegaly present.  Cardiovascular: Normal rate, regular rhythm, normal heart sounds and intact distal pulses. Exam reveals no gallop and no friction rub.  No murmur heard. Pulmonary/Chest: Effort normal and breath sounds normal. No respiratory distress. He has no wheezes. He has no rales. He exhibits no tenderness.  Abdominal: Soft. Bowel sounds are normal. He exhibits no mass. There is no  tenderness.  Genitourinary: Prostate normal and penis normal.  Musculoskeletal: Normal range of motion. He exhibits no edema.  Lymphadenopathy:    He has no cervical adenopathy.  Neurological: He is alert and oriented to person, place, and time. No cranial nerve deficit.  Skin: Skin is warm and dry.  Psychiatric: He has a normal mood and affect. His behavior is normal. Judgment and thought content normal.   BP 125/78   Pulse 73   Temp (!) 97 F (36.1 C) (Oral)   Ht _0  (1.778 m)   Wt 163 lb (73.9 kg)   BMI 23.39 kg/m   hgba1c 8.1%      Assessment & Plan:  1. Atherosclerosis of native coronary artery of native heart with angina pectoris ([place]) Keep follow up appointments with DR. Mcdowell  2. Essential hypertension Low sodium diet - CMP14+EGFR - metoprolol tartrate (LOPRESSOR) 25 MG tablet; Take 1 tablet (25 mg total) by mouth 2 (two) times daily.  Dispense: 180 tablet; Refill: 1 - lisinopril (PRINIVIL,ZESTRIL) 20 MG tablet; Take 1 tablet (20 mg total) by mouth daily. Take 1/2 tab daily  Dispense: 90 tablet; Refill: 1  3. Type 2 diabetes mellitus with stage 3 chronic kidney disease, without long-term current use of insulin (HCC) Stricter carb counting Increased metformin to 1056m BID Check fasting blood sugar daily - Bayer DCA Hb A1c Waived - Microalbumin / creatinine urine ratio - metFORMIN (GLUCOPHAGE) 1000 MG tablet; Take 1 tablet (1,000 mg total) by mouth 2 (two) times daily with a meal.  Dispense: 180 tablet; Refill: 1 - glipiZIDE (GLUCOTROL) 10 MG tablet; TAKE 1 TABLET BY MOUTH TWICE DAILY BEFORE MEAL(S)  Dispense: 60 tablet; Refill: 5  4. CKD (chronic kidney disease) stage 3, GFR 30-59 ml/min (HCC) Labs pending  5. Primary insomnia Bedtime routine - traZODone (DESYREL) 100 MG tablet; Take 1 tablet (100 mg total) by mouth daily with breakfast.  Dispense: 90 tablet; Refill: 1  6. Mixed hyperlipidemia Low fatdiet - Lipid panel - atorvastatin (LIPITOR) 40 MG  tablet; Take 1 tablet (40 mg total) by mouth daily.  Dispense: 90 tablet; Refill: 1  HAS EYE EXAM NEXT MONTH Labs pending Health maintenance reviewed Diet and exercise encouraged Continue all meds Follow up  In 3 months   MDowning FNP

## 2017-07-26 NOTE — Patient Instructions (Signed)

## 2017-07-27 LAB — CMP14+EGFR
A/G RATIO: 2.2 (ref 1.2–2.2)
ALK PHOS: 46 IU/L (ref 39–117)
ALT: 26 IU/L (ref 0–44)
AST: 35 IU/L (ref 0–40)
Albumin: 4.7 g/dL (ref 3.6–4.8)
BILIRUBIN TOTAL: 0.5 mg/dL (ref 0.0–1.2)
BUN / CREAT RATIO: 14 (ref 10–24)
BUN: 21 mg/dL (ref 8–27)
CHLORIDE: 100 mmol/L (ref 96–106)
CO2: 19 mmol/L — ABNORMAL LOW (ref 20–29)
Calcium: 9.3 mg/dL (ref 8.6–10.2)
Creatinine, Ser: 1.51 mg/dL — ABNORMAL HIGH (ref 0.76–1.27)
GFR calc non Af Amer: 48 mL/min/{1.73_m2} — ABNORMAL LOW (ref >59)
GFR, EST AFRICAN AMERICAN: 56 mL/min/{1.73_m2} — AB (ref >59)
GLUCOSE: 121 mg/dL — AB (ref 65–99)
Globulin, Total: 2.1 g/dL (ref 1.5–4.5)
POTASSIUM: 5 mmol/L (ref 3.5–5.2)
Sodium: 134 mmol/L (ref 134–144)
TOTAL PROTEIN: 6.8 g/dL (ref 6.0–8.5)

## 2017-07-27 LAB — LIPID PANEL
CHOLESTEROL TOTAL: 135 mg/dL (ref 100–199)
Chol/HDL Ratio: 4.5 ratio (ref 0.0–5.0)
HDL: 30 mg/dL — AB (ref >39)
LDL Calculated: 69 mg/dL (ref 0–99)
Triglycerides: 180 mg/dL — ABNORMAL HIGH (ref 0–149)
VLDL Cholesterol Cal: 36 mg/dL (ref 5–40)

## 2017-07-27 LAB — MICROALBUMIN / CREATININE URINE RATIO
CREATININE, UR: 130.8 mg/dL
MICROALBUM., U, RANDOM: 32.8 ug/mL
Microalb/Creat Ratio: 25.1 mg/g creat (ref 0.0–30.0)

## 2017-07-30 NOTE — Progress Notes (Signed)
Cardiology Office Note  Date: 07/31/2017   ID: Edward [name], DOB 1950/09/15, MRN [id number]  PCP: [name], [place]  Primary Cardiologist: Rozann Lesches, MD   Chief Complaint  Patient presents with  . Coronary Artery Disease    History of Present Illness: Edward [name] is a 67 y.o. male last seen in September 2018.  He is here with his wife for a follow-up visit.  He does not report any angina symptoms or nitroglycerin use since last encounter.  Remains functional with ADLs and walking around the house.  He has NYHA class II dyspnea at baseline, no palpitations or syncope.  We went over his medications.  Lisinopril dose was reduced due to lightheadedness, subsequently improved.  Current cardiac regimen includes aspirin, Lipitor, omega-3 supplements, Lopid, lisinopril, Lopressor, and as needed nitroglycerin.  He does need a refill for a fresh bottle.  Last ischemic testing was in 2016 as outlined below.  We discussed continue with observation for now in the absence of progressive symptoms.  Past Medical History:  Diagnosis Date  . Cataract   . CKD (chronic kidney disease) stage 3, GFR 30-59 ml/min (HCC)   . Coronary atherosclerosis of native coronary artery    BMS LAD 2006; residual 75% distal RCA; EF 50%  . Essential hypertension, benign   . Glaucoma   . Low back pain   . MI (myocardial infarction) ([place])    Anterior 2006  . Mixed hyperlipidemia   . Type 2 diabetes mellitus ([place])     Past Surgical History:  Procedure Laterality Date  . CORONARY ANGIOPLASTY WITH STENT PLACEMENT  2006    Current Outpatient Medications  Medication Sig Dispense Refill  . ACCU-CHEK AVIVA PLUS test strip USE TO CHECK BLOOD SUGAR ONCE DAILY 100 each 1  . aspirin 81 MG tablet Take 81 mg by mouth daily.      [name] atorvastatin (LIPITOR) 40 MG tablet Take 1 tablet (40 mg total) by mouth daily. 90 tablet 1  . brimonidine (ALPHAGAN) 0.15 % ophthalmic solution Place 1 drop into both eyes  2 (two) times daily.    . Cholecalciferol (VITAMIN D3) 2000 UNITS TABS Take 1 tablet by mouth daily.    . fish oil-omega-3 fatty acids 1000 MG capsule Take 1 g by mouth 2 (two) times daily.      [name] gemfibrozil (LOPID) 600 MG tablet Take 1 tablet (600 mg total) by mouth 2 (two) times daily before a meal. 90 tablet 1  . glipiZIDE (GLUCOTROL) 10 MG tablet TAKE 1 TABLET BY MOUTH TWICE DAILY BEFORE MEAL(S) 60 tablet 5  . lisinopril (PRINIVIL,ZESTRIL) 10 MG tablet Take 10 mg by mouth daily.    . metFORMIN (GLUCOPHAGE) 500 MG tablet Take 1,000 mg by mouth 2 (two) times daily with a meal.    . metoprolol tartrate (LOPRESSOR) 25 MG tablet Take 1 tablet (25 mg total) by mouth 2 (two) times daily. 180 tablet 1  . nitroGLYCERIN (NITROSTAT) 0.4 MG SL tablet Place 1 tablet (0.4 mg total) under the tongue every 5 (five) minutes as needed. 25 tablet 3  . traZODone (DESYREL) 100 MG tablet Take 1 tablet (100 mg total) by mouth daily with breakfast. 90 tablet 1   No current facility-administered medications for this visit.    Allergies:  Patient has no known allergies.   Social History: The patient  reports that he quit smoking about 30 years ago. His smoking use included cigarettes. He has a 40.00 pack-year smoking history. He has  never used smokeless tobacco. He reports that he does not drink alcohol or use drugs.   ROS:  Please see the history of present illness. Otherwise, complete review of systems is positive for none.  All other systems are reviewed and negative.   Physical Exam: VS:  BP (!) 108/58   Pulse (!) 59   Ht 5\' 10"  (1.778 m)   Wt 169 lb (76.7 kg)   SpO2 98%   BMI 24.25 kg/m , BMI Body mass index is 24.25 kg/m.  Wt Readings from Last 3 Encounters:  07/31/17 169 lb (76.7 kg)  07/26/17 163 lb (73.9 kg)  04/25/17 165 lb (74.8 kg)    General: Patient appears comfortable at rest. HEENT: Conjunctiva and lids normal, oropharynx clear. Neck: Supple, no elevated JVP or carotid bruits, no  thyromegaly. Lungs: Clear to auscultation, nonlabored breathing at rest. Cardiac: Regular rate and rhythm, no S3 or significant systolic murmur, no pericardial rub. Abdomen: Soft, nontender, bowel sounds present. Extremities: No pitting edema, distal pulses 2+. Skin: Warm and dry. Musculoskeletal: No kyphosis. Neuropsychiatric: Alert and oriented x3, affect grossly appropriate.  ECG: I personally reviewed the tracing from 01/28/2017 which showed sinus bradycardia with poor R wave progression and low voltage.  Recent Labwork: 07/26/2017: ALT 26; AST 35; BUN 21; Creatinine, Ser 1.51; Potassium 5.0; Sodium 134     Component Value Date/Time   CHOL 135 07/26/2017 0838   CHOL 177 09/25/2012 1015   TRIG 180 (H) 07/26/2017 0838   TRIG 206 (H) 10/07/2014 0927   TRIG 269 (H) 09/25/2012 1015   HDL 30 (L) 07/26/2017 0838   HDL 22 (L) 10/07/2014 0927   HDL 28 (L) 09/25/2012 1015   CHOLHDL 4.5 07/26/2017 0838   LDLCALC 69 07/26/2017 0838   LDLCALC 98 12/21/2013 0850   LDLCALC 95 09/25/2012 1015    Other Studies Reviewed Today:  [facility] Cardiolite 09/13/2014:  This is a low risk study.  Findings consistent with prior myocardial infarction.  Defect 1: There is a medium defect of moderate severity present in the apical anterior and apex location.  Assessment and Plan:  1.  CAD with history of BMS to the LAD in 2006 and moderate residual RCA disease.  He reports no angina on current medical regimen and underwent a low risk Cardiolite study in 2016.  Refill provided for fresh bottle of nitroglycerin.  Continue with observation for now.  2.  Mixed hyperlipidemia, he continues on statin therapy, Lopid, and omega-3 supplements.  Most recent lipid panel showed LDL 69 and triglycerides 180, both of which have decreased.  3.  CKD stage III, creatinine 1.5.  4.  Essential hypertension, blood pressure low normal today.  Continue with current regimen.  Current medicines were reviewed with the  patient today.  Disposition: Follow-up in 6 months.  Signed, Satira Sark, MD, [facility] 07/31/2017 9:36 AM    [facility] [place], [place], [place] [address] Phone: [phone]; Fax: [phone]

## 2017-07-31 ENCOUNTER — Encounter: Payer: Self-pay | Admitting: Cardiology

## 2017-07-31 ENCOUNTER — Ambulatory Visit: Payer: Medicaid Other | Admitting: Cardiology

## 2017-07-31 VITALS — BP 108/58 | HR 59 | Ht 70.0 in | Wt 169.0 lb

## 2017-07-31 DIAGNOSIS — I25119 Atherosclerotic heart disease of native coronary artery with unspecified angina pectoris: Secondary | ICD-10-CM

## 2017-07-31 DIAGNOSIS — E782 Mixed hyperlipidemia: Secondary | ICD-10-CM | POA: Diagnosis not present

## 2017-07-31 DIAGNOSIS — N183 Chronic kidney disease, stage 3 unspecified: Secondary | ICD-10-CM

## 2017-07-31 DIAGNOSIS — I10 Essential (primary) hypertension: Secondary | ICD-10-CM | POA: Diagnosis not present

## 2017-07-31 MED ORDER — NITROGLYCERIN 0.4 MG SL SUBL: 0.4000 mg | SUBLINGUAL_TABLET | SUBLINGUAL | 3 refills | Status: DC | PRN

## 2017-07-31 NOTE — Patient Instructions (Signed)

## 2017-08-06 ENCOUNTER — Other Ambulatory Visit: Payer: Self-pay | Admitting: *Deleted

## 2017-08-06 MED ORDER — LISINOPRIL 10 MG PO TABS: 10.0000 mg | ORAL_TABLET | Freq: Every day | ORAL | 0 refills | Status: DC

## 2017-08-12 ENCOUNTER — Other Ambulatory Visit: Payer: Self-pay | Admitting: Nurse Practitioner

## 2017-08-12 DIAGNOSIS — F5101 Primary insomnia: Secondary | ICD-10-CM

## 2017-08-12 MED ORDER — METFORMIN HCL 1000 MG PO TABS: 1000.0000 mg | ORAL_TABLET | Freq: Every day | ORAL | 1 refills | Status: DC

## 2017-08-12 MED ORDER — TRAZODONE HCL 100 MG PO TABS: 100.0000 mg | ORAL_TABLET | Freq: Every day | ORAL | 1 refills | Status: DC

## 2017-08-28 LAB — HM DIABETES EYE EXAM

## 2017-09-03 ENCOUNTER — Other Ambulatory Visit: Payer: Self-pay | Admitting: *Deleted

## 2017-09-03 DIAGNOSIS — E1122 Type 2 diabetes mellitus with diabetic chronic kidney disease: Secondary | ICD-10-CM

## 2017-09-03 DIAGNOSIS — N183 Chronic kidney disease, stage 3 (moderate): Principal | ICD-10-CM

## 2017-09-03 MED ORDER — GLIPIZIDE 10 MG PO TABS: ORAL_TABLET | ORAL | 0 refills | Status: DC

## 2017-09-05 ENCOUNTER — Other Ambulatory Visit: Payer: Self-pay | Admitting: Nurse Practitioner

## 2017-10-16 DIAGNOSIS — E559 Vitamin D deficiency, unspecified: Secondary | ICD-10-CM | POA: Diagnosis not present

## 2017-10-16 DIAGNOSIS — N183 Chronic kidney disease, stage 3 (moderate): Secondary | ICD-10-CM | POA: Diagnosis not present

## 2017-10-16 DIAGNOSIS — D509 Iron deficiency anemia, unspecified: Secondary | ICD-10-CM | POA: Diagnosis not present

## 2017-10-16 DIAGNOSIS — I10 Essential (primary) hypertension: Secondary | ICD-10-CM | POA: Diagnosis not present

## 2017-10-16 DIAGNOSIS — R809 Proteinuria, unspecified: Secondary | ICD-10-CM | POA: Diagnosis not present

## 2017-10-16 DIAGNOSIS — Z79899 Other long term (current) drug therapy: Secondary | ICD-10-CM | POA: Diagnosis not present

## 2017-10-19 DIAGNOSIS — Z23 Encounter for immunization: Secondary | ICD-10-CM | POA: Diagnosis not present

## 2017-10-23 DIAGNOSIS — E1129 Type 2 diabetes mellitus with other diabetic kidney complication: Secondary | ICD-10-CM | POA: Diagnosis not present

## 2017-10-23 DIAGNOSIS — E875 Hyperkalemia: Secondary | ICD-10-CM | POA: Diagnosis not present

## 2017-10-23 DIAGNOSIS — E889 Metabolic disorder, unspecified: Secondary | ICD-10-CM | POA: Diagnosis not present

## 2017-10-23 DIAGNOSIS — M9080 Osteopathy in diseases classified elsewhere, unspecified site: Secondary | ICD-10-CM | POA: Diagnosis not present

## 2017-10-23 DIAGNOSIS — I10 Essential (primary) hypertension: Secondary | ICD-10-CM | POA: Diagnosis not present

## 2017-10-23 DIAGNOSIS — R809 Proteinuria, unspecified: Secondary | ICD-10-CM | POA: Diagnosis not present

## 2017-10-23 DIAGNOSIS — N183 Chronic kidney disease, stage 3 (moderate): Secondary | ICD-10-CM | POA: Diagnosis not present

## 2017-10-29 ENCOUNTER — Encounter: Payer: Self-pay | Admitting: Nurse Practitioner

## 2017-10-29 ENCOUNTER — Ambulatory Visit (INDEPENDENT_AMBULATORY_CARE_PROVIDER_SITE_OTHER): Payer: Medicare Other | Admitting: Nurse Practitioner

## 2017-10-29 VITALS — BP 129/77 | HR 67 | Temp 97.7°F | Ht 70.0 in | Wt 160.0 lb

## 2017-10-29 DIAGNOSIS — E782 Mixed hyperlipidemia: Secondary | ICD-10-CM

## 2017-10-29 DIAGNOSIS — N183 Chronic kidney disease, stage 3 unspecified: Secondary | ICD-10-CM

## 2017-10-29 DIAGNOSIS — I25119 Atherosclerotic heart disease of native coronary artery with unspecified angina pectoris: Secondary | ICD-10-CM

## 2017-10-29 DIAGNOSIS — E1122 Type 2 diabetes mellitus with diabetic chronic kidney disease: Secondary | ICD-10-CM

## 2017-10-29 DIAGNOSIS — F5101 Primary insomnia: Secondary | ICD-10-CM

## 2017-10-29 DIAGNOSIS — I10 Essential (primary) hypertension: Secondary | ICD-10-CM

## 2017-10-29 LAB — CMP14+EGFR
ALK PHOS: 49 IU/L (ref 39–117)
ALT: 25 IU/L (ref 0–44)
AST: 36 IU/L (ref 0–40)
Albumin/Globulin Ratio: 2.2 (ref 1.2–2.2)
Albumin: 4.4 g/dL (ref 3.6–4.8)
BUN/Creatinine Ratio: 11 (ref 10–24)
BUN: 15 mg/dL (ref 8–27)
Bilirubin Total: 0.4 mg/dL (ref 0.0–1.2)
CALCIUM: 9.4 mg/dL (ref 8.6–10.2)
CO2: 19 mmol/L — AB (ref 20–29)
CREATININE: 1.36 mg/dL — AB (ref 0.76–1.27)
Chloride: 105 mmol/L (ref 96–106)
GFR calc Af Amer: 63 mL/min/{1.73_m2} (ref >59)
GFR, EST NON AFRICAN AMERICAN: 54 mL/min/{1.73_m2} — AB (ref >59)
GLUCOSE: 114 mg/dL — AB (ref 65–99)
Globulin, Total: 2 g/dL (ref 1.5–4.5)
Potassium: 5.4 mmol/L — ABNORMAL HIGH (ref 3.5–5.2)
Sodium: 138 mmol/L (ref 134–144)
Total Protein: 6.4 g/dL (ref 6.0–8.5)

## 2017-10-29 LAB — LIPID PANEL
CHOL/HDL RATIO: 4 ratio (ref 0.0–5.0)
CHOLESTEROL TOTAL: 123 mg/dL (ref 100–199)
HDL: 31 mg/dL — AB (ref >39)
LDL CALC: 66 mg/dL (ref 0–99)
Triglycerides: 131 mg/dL (ref 0–149)
VLDL CHOLESTEROL CAL: 26 mg/dL (ref 5–40)

## 2017-10-29 LAB — BAYER DCA HB A1C WAIVED: HB A1C (BAYER DCA - WAIVED): 6.6 % (ref <7.0)

## 2017-10-29 MED ORDER — METFORMIN HCL 1000 MG PO TABS: ORAL_TABLET | ORAL | 1 refills | Status: DC

## 2017-10-29 MED ORDER — ATORVASTATIN CALCIUM 40 MG PO TABS: 40.0000 mg | ORAL_TABLET | Freq: Every day | ORAL | 1 refills | Status: DC

## 2017-10-29 NOTE — Progress Notes (Signed)
Subjective:    Patient ID: Edward [name], male    DOB: 1951-04-04, 67 y.o.   MRN: [id number]   Chief Complaint: Medical managemnet of chronic issues  HPI:  1. Type 2 diabetes mellitus with stage 3 chronic kidney disease, without long-term current use of insulin (HCC) last HGBA1c was 8.1%. He had not been checking blood sugars at home. We increased his metformin from 500 to 1039m BID. And he was to check blood sugars everyday and keep a diary. He is averaging around 100 fastig now- has had some readings in the 60's. He does say that metformin dose increase is giving himdiarrhea.  2. Essential hypertension  No c/o chest pain, sob or headache. He does not check blood pressure at home. BP Readings from Last 3 Encounters:  07/31/17 (!) 108/58  07/26/17 125/78  04/25/17 109/71     3. Mixed hyperlipidemia  Does not watch diet and does very little exercise if any.  4. Atherosclerosis of native coronary artery of native heart with angina pectoris ([facility] Saw cardiology on  07/31/17. No change was made to plan of care.  5. CKD (chronic kidney disease) stage 3, GFR 30-59 ml/min (HCC)  Last creatine was 1.51 with GFR of 48. We are currently just watching labs. He denies any swelling.  6. Primary insomnia  He takes trazadone to sleep at night. Says he is not able to sleep without it.    Outpatient Encounter Medications as of 10/29/2017  Medication Sig  . ACCU-CHEK AVIVA PLUS test strip USE TO CHECK BLOOD SUGAR ONCE DAILY  . aspirin 81 MG tablet Take 81 mg by mouth daily.    .Marland Kitchenatorvastatin (LIPITOR) 40 MG tablet Take 1 tablet (40 mg total) by mouth daily.  . brimonidine (ALPHAGAN) 0.15 % ophthalmic solution Place 1 drop into both eyes 2 (two) times daily.  . Cholecalciferol (VITAMIN D3) 2000 UNITS TABS Take 1 tablet by mouth daily.  . fish oil-omega-3 fatty acids 1000 MG capsule Take 1 g by mouth 2 (two) times daily.    .Marland Kitchengemfibrozil (LOPID) 600 MG tablet TAKE 1 TABLET BY MOUTH TWICE DAILY  BEFORE  A  MEAL  . glipiZIDE (GLUCOTROL) 10 MG tablet TAKE 1 TABLET BY MOUTH TWICE DAILY BEFORE MEAL(S)  . lisinopril (PRINIVIL,ZESTRIL) 10 MG tablet Take 1 tablet (10 mg total) by mouth daily.  . metFORMIN (GLUCOPHAGE) 1000 MG tablet Take 1 tablet (1,000 mg total) by mouth daily with breakfast.  . metoprolol tartrate (LOPRESSOR) 25 MG tablet Take 1 tablet (25 mg total) by mouth 2 (two) times daily.  . nitroGLYCERIN (NITROSTAT) 0.4 MG SL tablet Place 1 tablet (0.4 mg total) under the tongue every 5 (five) minutes as needed.  . traZODone (DESYREL) 100 MG tablet Take 1 tablet (100 mg total) by mouth at bedtime.       New complaints: None today  Social history: [place] [employer] [place] He just piddles around his house. Lives by hisself. Has 2 children that he sees everyday.   Review of Systems  Constitutional: Negative for activity change and appetite change.  HENT: Negative.   Eyes: Negative for pain.  Respiratory: Negative for shortness of breath.   Cardiovascular: Negative for chest pain, palpitations and leg swelling.  Gastrointestinal: Positive for diarrhea. Negative for abdominal pain.  Endocrine: Negative for polydipsia.  Genitourinary: Negative.   Skin: Negative for rash.  Neurological: Negative for dizziness, weakness and headaches.  Hematological: Does not bruise/bleed easily.  Psychiatric/Behavioral: Negative.   All  other systems reviewed and are negative.      Objective:   Physical Exam  Constitutional: He is oriented to person, place, and time.  HENT:  Head: Normocephalic.  Nose: Nose normal.  Mouth/Throat: Oropharynx is clear and moist.  Eyes: Pupils are equal, round, and reactive to light. EOM are normal.  Neck: Normal range of motion and phonation normal. Neck supple. No JVD present. Carotid bruit is not present. No thyroid mass and no thyromegaly present.  Cardiovascular: Normal rate and regular rhythm.  Pulmonary/Chest: Effort normal and breath  sounds normal. No respiratory distress.  Abdominal: Soft. Normal appearance, normal aorta and bowel sounds are normal. There is no tenderness.  Musculoskeletal: Normal range of motion.  Lymphadenopathy:    He has no cervical adenopathy.  Neurological: He is alert and oriented to person, place, and time.  Skin: Skin is warm and dry.  Psychiatric: Judgment normal.     BP 129/77   Pulse 67   Temp 97.7 F (36.5 C) (Oral)   Ht 5' 10"  (1.778 m)   Wt 160 lb (72.6 kg)   BMI 22.96 kg/m   hgba1c 6.6%    Assessment & Plan:  Edward [name] comes in today with chief complaint of Medical Management of Chronic Issues   Diagnosis and orders addressed:  1. Type 2 diabetes mellitus with stage 3 chronic kidney disease, without long-term current use of insulin (HCC) contihue to watch carbsin diet Decreased metformin 101m to 1/2 tablet in morning and whle tabket at night Continue to keep check of blood sugars at home. - Bayer DCA Hb A1c Waived  2. Essential hypertension Low sodium diet - CMP14+EGFR  3. Mixed hyperlipidemia Low fat diet - Lipid panel  4. Atherosclerosis of native coronary artery of native heart with angina pectoris ([facility] Keep follow up with cardiology  5. CKD (chronic kidney disease) stage 3, GFR 30-59 ml/min (HCC) Labs pending  6. Primary insomnia Bedtime routine   Labs pending Health Maintenance reviewed Diet and exercise encouraged  Follow up plan: 3 months   Mary-Margaret MHassell Done FNP

## 2017-10-29 NOTE — Patient Instructions (Signed)

## 2017-11-04 ENCOUNTER — Telehealth: Payer: Self-pay | Admitting: Nurse Practitioner

## 2017-11-04 NOTE — Telephone Encounter (Signed)
Left detailed message on wife's voicemail

## 2017-11-11 ENCOUNTER — Other Ambulatory Visit: Payer: Self-pay | Admitting: Nurse Practitioner

## 2017-11-11 DIAGNOSIS — E875 Hyperkalemia: Secondary | ICD-10-CM | POA: Diagnosis not present

## 2017-11-27 DIAGNOSIS — N183 Chronic kidney disease, stage 3 (moderate): Secondary | ICD-10-CM | POA: Diagnosis not present

## 2017-11-27 DIAGNOSIS — I10 Essential (primary) hypertension: Secondary | ICD-10-CM | POA: Diagnosis not present

## 2017-11-27 DIAGNOSIS — Z79899 Other long term (current) drug therapy: Secondary | ICD-10-CM | POA: Diagnosis not present

## 2018-01-29 NOTE — Progress Notes (Signed)
Cardiology Office Note  Date: 01/30/2018   ID: Edward [name], DOB Oct 04, 1950, MRN [id number]  PCP: [name], [place]  Primary Cardiologist: Rozann Lesches, MD   Chief Complaint  Patient presents with  . Coronary Artery Disease    History of Present Illness: Edward [name] is a 67 y.o. male last seen in March.  He is here today with his wife for a follow-up visit.  He tells me that since I last saw him, he has had intermittent episodes of chest tightness and shortness of breath, not always with exertion, sometimes in the evenings.  Also notices this intermittently when he walks out to his mailbox.  He has taken nitroglycerin on a few occasions with relief.  Symptoms usually last no more than a few minutes.  It does remind him of previous angina.  He is several years out from BMS to the LAD in 2006, also had residual distal RCA disease that was managed medically.  I reviewed his interval lab work which is outlined below.  Creatinine is 1.36 and LDL well-controlled at 66.  Last ischemic testing was in 2016 as outlined below.  I personally reviewed his ECG today which shows sinus bradycardia with poor R wave progression and low voltage.  Past Medical History:  Diagnosis Date  . Cataract   . CKD (chronic kidney disease) stage 3, GFR 30-59 ml/min (HCC)   . Coronary atherosclerosis of native coronary artery    BMS LAD 2006; residual 75% distal RCA; EF 50%  . Essential hypertension, benign   . Glaucoma   . Low back pain   . MI (myocardial infarction) ([facility])    Anterior 2006  . Mixed hyperlipidemia   . Type 2 diabetes mellitus ([place])     Past Surgical History:  Procedure Laterality Date  . CORONARY ANGIOPLASTY WITH STENT PLACEMENT  2006    Current Outpatient Medications  Medication Sig Dispense Refill  . ACCU-CHEK AVIVA PLUS test strip USE TO CHECK BLOOD SUGAR ONCE DAILY 100 each 3  . aspirin 81 MG tablet Take 81 mg by mouth daily.      [name] atorvastatin (LIPITOR) 40  MG tablet Take 1 tablet (40 mg total) by mouth daily. 90 tablet 1  . brimonidine (ALPHAGAN) 0.15 % ophthalmic solution Place 1 drop into both eyes 2 (two) times daily.    . Cholecalciferol (VITAMIN D3) 2000 UNITS TABS Take 1 tablet by mouth daily.    . fish oil-omega-3 fatty acids 1000 MG capsule Take 1 g by mouth 2 (two) times daily.      [name] gemfibrozil (LOPID) 600 MG tablet TAKE 1 TABLET BY MOUTH TWICE DAILY BEFORE  A  MEAL 180 tablet 1  . glipiZIDE (GLUCOTROL) 10 MG tablet TAKE 1 TABLET BY MOUTH TWICE DAILY BEFORE MEAL(S) 180 tablet 0  . metFORMIN (GLUCOPHAGE) 1000 MG tablet 1/2 tablet in AM and whole tablet in evening 180 tablet 1  . metoprolol tartrate (LOPRESSOR) 25 MG tablet Take 1 tablet (25 mg total) by mouth 2 (two) times daily. 180 tablet 1  . nitroGLYCERIN (NITROSTAT) 0.4 MG SL tablet Place 1 tablet (0.4 mg total) under the tongue every 5 (five) minutes as needed. 25 tablet 3  . traZODone (DESYREL) 100 MG tablet Take 1 tablet (100 mg total) by mouth at bedtime. 90 tablet 1  . isosorbide mononitrate (IMDUR) 30 MG 24 hr tablet Take 0.5 tablets (15 mg total) by mouth daily. New 01/30/2018 15 tablet 6   No current facility-administered medications  for this visit.    Allergies:  Patient has no known allergies.   Social History: The patient  reports that he quit smoking about 30 years ago. His smoking use included cigarettes. He has a 40.00 pack-year smoking history. He has never used smokeless tobacco. He reports that he does not drink alcohol or use drugs.   ROS:  Please see the history of present illness. Otherwise, complete review of systems is positive for none.  All other systems are reviewed and negative.   Physical Exam: VS:  BP 138/74   Pulse (!) 58   Ht 5\' 10"  (1.778 m)   Wt 161 lb (73 kg)   SpO2 95%   BMI 23.10 kg/m , BMI Body mass index is 23.1 kg/m.  Wt Readings from Last 3 Encounters:  01/30/18 161 lb (73 kg)  10/29/17 160 lb (72.6 kg)  07/31/17 169 lb (76.7 kg)      General: Patient appears comfortable at rest. HEENT: Conjunctiva and lids normal, oropharynx clear. Neck: Supple, no elevated JVP or carotid bruits, no thyromegaly. Lungs: Clear to auscultation, nonlabored breathing at rest. Cardiac: Regular rate and rhythm, no S3 or significant systolic murmur. Abdomen: Soft, nontender, bowel sounds present. Extremities: No pitting edema, distal pulses 2+. Skin: Warm and dry. Musculoskeletal: No kyphosis. Neuropsychiatric: Alert and oriented x3, affect grossly appropriate.  ECG: I personally reviewed the tracing from 01/28/2017 which showed sinus bradycardia with poor R wave progression and low voltage.  Recent Labwork: 10/29/2017: ALT 25; AST 36; BUN 15; Creatinine, Ser 1.36; Potassium 5.4; Sodium 138     Component Value Date/Time   CHOL 123 10/29/2017 0905   CHOL 177 09/25/2012 1015   TRIG 131 10/29/2017 0905   TRIG 206 (H) 10/07/2014 0927   TRIG 269 (H) 09/25/2012 1015   HDL 31 (L) 10/29/2017 0905   HDL 22 (L) 10/07/2014 0927   HDL 28 (L) 09/25/2012 1015   CHOLHDL 4.0 10/29/2017 0905   LDLCALC 66 10/29/2017 0905   LDLCALC 98 12/21/2013 0850   LDLCALC 95 09/25/2012 1015    Other Studies Reviewed Today:  [facility] Cardiolite 09/13/2014:  This is a low risk study.  Findings consistent with prior myocardial infarction.  Defect 1: There is a medium defect of moderate severity present in the apical anterior and apex location.  Assessment and Plan:  1.  CAD with recurring angina symptoms since last encounter, responsive to nitroglycerin, and generally brief.  He has a history of BMS to the LAD in 2006 with 75% distal RCA disease that was managed medically.  Certainly likely that he has had progression in disease since that time.  We will continue medical therapy which now includes aspirin, statin, and beta-blocker.  Add Imdur starting at 15 mg daily and follow-up with a Lexiscan Myoview to assess ischemic burden and distribution.  Depending on  symptom control and test results we can decide next step.  2.  Mixed hyperlipidemia, on Lipitor.  Recent LDL 66.  3.  CKD stage III, most recent creatinine 1.36 in June.  4.  Essential hypertension, blood pressure is adequately controlled today.  Current medicines were reviewed with the patient today.   Orders Placed This Encounter  Procedures  . NM Myocar Multi W/Spect W/Wall Motion / EF  . EKG 12-Lead    Disposition: Follow-up in 4 to 6 weeks.   Signed, Satira Sark, MD, [facility] 01/30/2018 9:38 AM    [facility] [place], [place], [place]  [place] Phone: [phone]; Fax: [phone]

## 2018-01-30 ENCOUNTER — Ambulatory Visit (INDEPENDENT_AMBULATORY_CARE_PROVIDER_SITE_OTHER): Payer: Medicare Other | Admitting: Cardiology

## 2018-01-30 ENCOUNTER — Telehealth: Payer: Self-pay | Admitting: Cardiology

## 2018-01-30 ENCOUNTER — Encounter: Payer: Self-pay | Admitting: Cardiology

## 2018-01-30 ENCOUNTER — Encounter: Payer: Self-pay | Admitting: *Deleted

## 2018-01-30 VITALS — BP 138/74 | HR 58 | Ht 70.0 in | Wt 161.0 lb

## 2018-01-30 DIAGNOSIS — E782 Mixed hyperlipidemia: Secondary | ICD-10-CM | POA: Diagnosis not present

## 2018-01-30 DIAGNOSIS — I25119 Atherosclerotic heart disease of native coronary artery with unspecified angina pectoris: Secondary | ICD-10-CM

## 2018-01-30 DIAGNOSIS — N183 Chronic kidney disease, stage 3 unspecified: Secondary | ICD-10-CM

## 2018-01-30 DIAGNOSIS — I10 Essential (primary) hypertension: Secondary | ICD-10-CM

## 2018-01-30 MED ORDER — ISOSORBIDE MONONITRATE ER 30 MG PO TB24: 15.0000 mg | ORAL_TABLET | Freq: Every day | ORAL | 6 refills | Status: DC

## 2018-01-30 NOTE — Patient Instructions (Addendum)
Medication Instructions:   Begin Imdur 15mg  every evening.  Continue all other medications.    Labwork: none  Testing/Procedures:  Your physician has requested that you have a lexiscan myoview. For further information please visit [email or web address]. Please follow instruction sheet, as given.  Office will contact with results via phone or letter.    Follow-Up: 4-6 weeks   Any Other Special Instructions Will Be Listed Below (If Applicable).  If you need a refill on your cardiac medications before your next appointment, please call your pharmacy.

## 2018-01-30 NOTE — Telephone Encounter (Signed)
Pre-cert Verification for the following procedure   lexiscan - cad w/ angina  Scheduled for 02-04-2018 at [facility]

## 2018-01-31 ENCOUNTER — Ambulatory Visit: Payer: Medicare Other | Admitting: Nurse Practitioner

## 2018-01-31 ENCOUNTER — Encounter: Payer: Medicare Other | Admitting: Nurse Practitioner

## 2018-02-03 DIAGNOSIS — Z23 Encounter for immunization: Secondary | ICD-10-CM | POA: Diagnosis not present

## 2018-02-04 ENCOUNTER — Encounter (HOSPITAL_COMMUNITY): Payer: Self-pay

## 2018-02-04 ENCOUNTER — Encounter (HOSPITAL_COMMUNITY)
Admission: RE | Admit: 2018-02-04 | Discharge: 2018-02-04 | Disposition: A | Discharge status: Home / Self Care | Payer: Medicare Other | Source: Ambulatory Visit | Attending: Cardiology | Admitting: Cardiology

## 2018-02-04 ENCOUNTER — Encounter (HOSPITAL_BASED_OUTPATIENT_CLINIC_OR_DEPARTMENT_OTHER)
Admission: RE | Admit: 2018-02-04 | Discharge: 2018-02-04 | Disposition: A | Discharge status: Home / Self Care | Payer: Medicare Other | Source: Ambulatory Visit | Attending: Cardiology | Admitting: Cardiology

## 2018-02-04 DIAGNOSIS — I25119 Atherosclerotic heart disease of native coronary artery with unspecified angina pectoris: Secondary | ICD-10-CM | POA: Insufficient documentation

## 2018-02-04 HISTORY — DX: Disorder of kidney and ureter, unspecified: N28.9

## 2018-02-04 LAB — NM MYOCAR MULTI W/SPECT W/WALL MOTION / EF
CHL CUP NUCLEAR SDS: 5
CHL CUP RESTING HR STRESS: 58 {beats}/min
LHR: 0.77
LV dias vol: 94 mL (ref 62–150)
LVSYSVOL: 44 mL
Peak HR: 93 {beats}/min
SRS: 11
SSS: 15
TID: 1.06

## 2018-02-04 MED ORDER — TECHNETIUM TC 99M TETROFOSMIN IV KIT
30.0000 | PACK | Freq: Once | INTRAVENOUS | Status: AC | PRN
  Administered 2018-02-04: 32.5 via INTRAVENOUS

## 2018-02-04 MED ORDER — REGADENOSON 0.4 MG/5ML IV SOLN
INTRAVENOUS | Status: AC
  Administered 2018-02-04: 0.4 mg via INTRAVENOUS
  Filled 2018-02-04: qty 5

## 2018-02-04 MED ORDER — SODIUM CHLORIDE 0.9% FLUSH
INTRAVENOUS | Status: AC
  Administered 2018-02-04: 10 mL via INTRAVENOUS
  Filled 2018-02-04: qty 10

## 2018-02-04 MED ORDER — TECHNETIUM TC 99M TETROFOSMIN IV KIT
10.0000 | PACK | Freq: Once | INTRAVENOUS | Status: AC | PRN
  Administered 2018-02-04: 10 via INTRAVENOUS

## 2018-02-05 ENCOUNTER — Telehealth: Payer: Self-pay | Admitting: *Deleted

## 2018-02-05 NOTE — Telephone Encounter (Signed)
-----   Message from Satira Sark, MD sent at 02/04/2018  1:04 PM EDT ----- Results reviewed.  Study shows combination of scar from previous infarct and also moderate peri-infarct ischemia which likely correlates with his angina symptoms.  LVEF normal range.  We will see how he is doing on medical therapy since the addition of nitrates, office follow-up should already be arranged.  Cardiac catheterization would be our next step if his symptoms do not improve. A copy of this test should be forwarded to [name], FNP.

## 2018-02-05 NOTE — Telephone Encounter (Signed)
Patient informed and verbalized understanding of plan. 

## 2018-02-21 ENCOUNTER — Other Ambulatory Visit: Payer: Self-pay | Admitting: Nurse Practitioner

## 2018-02-28 DIAGNOSIS — H401131 Primary open-angle glaucoma, bilateral, mild stage: Secondary | ICD-10-CM | POA: Diagnosis not present

## 2018-02-28 LAB — HM DIABETES EYE EXAM

## 2018-03-14 NOTE — Progress Notes (Signed)
Cardiology Office Note  Date: 03/17/2018   Edward [name], DOB 1950/10/22, MRN [id number]  PCP: [name], [place]  Primary Cardiologist: Rozann Lesches, MD   Chief Complaint  Patient presents with  . Coronary Artery Disease    History of Present Illness: Edward [name] is a 67 y.o. male last seen in September.  At that time he was started on Imdur in addition to his baseline cardiac regimen and a follow-up Lexiscan Myoview was obtained.  Lexiscan Myoview revealed a region of anterior/anteroseptal scar as well as a region of inferior/inferolateral scar with moderate peri-infarct ischemia.  LVEF was 53%. He is several years out from BMS to the LAD in 2006, also had residual distal RCA disease that was managed medically.  We discussed the results of his stress testing today.  He still has not had resolution of suspected angina symptoms although Imdur has only been taken at 15 mg daily.  I talked with him about diagnostic cardiac catheterization versus trying to continue to titrate medications.  For now he preferred medical therapy.  Past Medical History:  Diagnosis Date  . Cataract   . CKD (chronic kidney disease) stage 3, GFR 30-59 ml/min (HCC)   . Coronary atherosclerosis of native coronary artery    BMS LAD 2006; residual 75% distal RCA; EF 50%  . Essential hypertension, benign   . Glaucoma   . Low back pain   . MI (myocardial infarction) ([place])    Anterior 2006  . Mixed hyperlipidemia   . Renal insufficiency   . Type 2 diabetes mellitus ([place])     Past Surgical History:  Procedure Laterality Date  . CORONARY ANGIOPLASTY WITH STENT PLACEMENT  2006    Current Outpatient Medications  Medication Sig Dispense Refill  . ACCU-CHEK AVIVA PLUS test strip USE TO CHECK BLOOD SUGAR ONCE DAILY 100 each 3  . aspirin 81 MG tablet Take 81 mg by mouth daily.      [name] atorvastatin (LIPITOR) 40 MG tablet Take 1 tablet (40 mg total) by mouth daily. 90 tablet 1  . brimonidine  (ALPHAGAN) 0.15 % ophthalmic solution Place 1 drop into both eyes 2 (two) times daily.    . Cholecalciferol (VITAMIN D3) 2000 UNITS TABS Take 1 tablet by mouth daily.    . fish oil-omega-3 fatty acids 1000 MG capsule Take 1 g by mouth 2 (two) times daily.      [name] gemfibrozil (LOPID) 600 MG tablet TAKE 1 TABLET BY MOUTH TWICE DAILY BEFORE A MEAL 180 tablet 0  . glipiZIDE (GLUCOTROL) 10 MG tablet TAKE 1 TABLET BY MOUTH TWICE DAILY BEFORE MEAL(S) 180 tablet 0  . isosorbide mononitrate (IMDUR) 30 MG 24 hr tablet Take 1 tablet (30 mg total) by mouth daily. New 01/30/2018 90 tablet 1  . metFORMIN (GLUCOPHAGE) 1000 MG tablet 1/2 tablet in AM and whole tablet in evening 180 tablet 1  . metoprolol tartrate (LOPRESSOR) 25 MG tablet Take 1 tablet (25 mg total) by mouth 2 (two) times daily. 180 tablet 1  . nitroGLYCERIN (NITROSTAT) 0.4 MG SL tablet Place 1 tablet (0.4 mg total) under the tongue every 5 (five) minutes as needed. 25 tablet 3  . traZODone (DESYREL) 100 MG tablet Take 1 tablet (100 mg total) by mouth at bedtime. 90 tablet 1   No current facility-administered medications for this visit.    Allergies:  Patient has no known allergies.   Social History: The patient  reports that he quit smoking about 30 years  ago. His smoking use included cigarettes. He has a 40.00 pack-year smoking history. He has never used smokeless tobacco. He reports that he does not drink alcohol or use drugs.   ROS:  Please see the history of present illness. Otherwise, complete review of systems is positive for none.  All other systems are reviewed and negative.   Physical Exam: VS:  BP 128/68   Pulse 76   Ht 5\' 10"  (1.778 m)   Wt 164 lb 3.2 oz (74.5 kg)   SpO2 97%   BMI 23.56 kg/m , BMI Body mass index is 23.56 kg/m.  Wt Readings from Last 3 Encounters:  03/17/18 164 lb 3.2 oz (74.5 kg)  01/30/18 161 lb (73 kg)  10/29/17 160 lb (72.6 kg)    General: Patient appears comfortable at rest. HEENT: Conjunctiva and  lids normal, oropharynx clear. Neck: Supple, no elevated JVP or carotid bruits, no thyromegaly. Lungs: Clear to auscultation, nonlabored breathing at rest. Cardiac: Regular rate and rhythm, no S3 or significant systolic murmur. Abdomen: Soft, nontender, bowel sounds present. Extremities: No pitting edema, distal pulses 2+. Skin: Warm and dry. Musculoskeletal: No kyphosis. Neuropsychiatric: Alert and oriented x3, affect grossly appropriate.  ECG: I personally reviewed the tracing from 01/30/2018 which showed sinus bradycardia with poor R wave progression and low voltage.  Recent Labwork: 10/29/2017: ALT 25; AST 36; BUN 15; Creatinine, Ser 1.36; Potassium 5.4; Sodium 138     Component Value Date/Time   CHOL 123 10/29/2017 0905   CHOL 177 09/25/2012 1015   TRIG 131 10/29/2017 0905   TRIG 206 (H) 10/07/2014 0927   TRIG 269 (H) 09/25/2012 1015   HDL 31 (L) 10/29/2017 0905   HDL 22 (L) 10/07/2014 0927   HDL 28 (L) 09/25/2012 1015   CHOLHDL 4.0 10/29/2017 0905   LDLCALC 66 10/29/2017 0905   LDLCALC 98 12/21/2013 0850   LDLCALC 95 09/25/2012 1015    Other Studies Reviewed Today:  [facility] Myoview 02/04/2018:  There was no ST segment deviation noted during stress.  Defect 1: There is a medium defect of severe severity present in the mid anteroseptal, apical anterior, apical septal and apex location. This is consistent with myocardial scar.  Defect 2: There is a medium defect of moderate severity present in the mid inferolateral, apical inferior and apical lateral location. This is consistent with scar with a moderate degree of peri-infarct ischemia.  Findings consistent with prior myocardial infarction with moderate peri-infarct ischemia in the inferolateral wall.  This is an intermediate risk study.  Nuclear stress EF: 53%.  Assessment and Plan:  1.  CAD with history of BMS to the LAD in 2006 with 75% distal RCA disease.  We have discussed suspected progression and  atherosclerosis particularly in light of his follow-up Myoview study.  For now he prefers medical therapy adjustments, although I have talked with him about a diagnostic cardiac catheterization.  We will increase Imdur to 30 mg daily and have him come back for office follow-up.  2.  Mixed hyperlipidemia on Lipitor.  Recent LDL 66.  3.  CKD stage III, creatinine 1.36.  4.  Essential hypertension, blood pressure control is adequate today.  Current medicines were reviewed with the patient today.  Disposition: Follow-up in 6 weeks.  Signed, Satira Sark, MD, [facility] 03/17/2018 4:19 PM    [facility] [place], [place], [place] [address] Phone: [phone]; Fax: [phone]

## 2018-03-17 ENCOUNTER — Encounter: Payer: Self-pay | Admitting: Cardiology

## 2018-03-17 ENCOUNTER — Ambulatory Visit (INDEPENDENT_AMBULATORY_CARE_PROVIDER_SITE_OTHER): Payer: Medicare Other | Admitting: Cardiology

## 2018-03-17 VITALS — BP 128/68 | HR 76 | Ht 70.0 in | Wt 164.2 lb

## 2018-03-17 DIAGNOSIS — N183 Chronic kidney disease, stage 3 unspecified: Secondary | ICD-10-CM

## 2018-03-17 DIAGNOSIS — I10 Essential (primary) hypertension: Secondary | ICD-10-CM | POA: Diagnosis not present

## 2018-03-17 DIAGNOSIS — I25119 Atherosclerotic heart disease of native coronary artery with unspecified angina pectoris: Secondary | ICD-10-CM | POA: Diagnosis not present

## 2018-03-17 DIAGNOSIS — E782 Mixed hyperlipidemia: Secondary | ICD-10-CM | POA: Diagnosis not present

## 2018-03-17 MED ORDER — ISOSORBIDE MONONITRATE ER 30 MG PO TB24: 30.0000 mg | ORAL_TABLET | Freq: Every day | ORAL | 1 refills | Status: DC

## 2018-03-17 NOTE — Patient Instructions (Signed)
Your physician recommends that you schedule a follow-up appointment in: [place] has recommended you make the following change in your medication:   INCREASE IMDUR 30 MG DAILY   Thank you for choosing [facility]!!

## 2018-04-15 ENCOUNTER — Encounter: Payer: Self-pay | Admitting: Nurse Practitioner

## 2018-04-15 ENCOUNTER — Ambulatory Visit (INDEPENDENT_AMBULATORY_CARE_PROVIDER_SITE_OTHER): Payer: Medicare Other | Admitting: Nurse Practitioner

## 2018-04-15 VITALS — BP 129/70 | HR 68 | Temp 97.5°F | Ht 70.0 in | Wt 159.0 lb

## 2018-04-15 DIAGNOSIS — E782 Mixed hyperlipidemia: Secondary | ICD-10-CM

## 2018-04-15 DIAGNOSIS — I25119 Atherosclerotic heart disease of native coronary artery with unspecified angina pectoris: Secondary | ICD-10-CM

## 2018-04-15 DIAGNOSIS — F5101 Primary insomnia: Secondary | ICD-10-CM

## 2018-04-15 DIAGNOSIS — I10 Essential (primary) hypertension: Secondary | ICD-10-CM

## 2018-04-15 DIAGNOSIS — E1122 Type 2 diabetes mellitus with diabetic chronic kidney disease: Secondary | ICD-10-CM

## 2018-04-15 DIAGNOSIS — N183 Chronic kidney disease, stage 3 unspecified: Secondary | ICD-10-CM

## 2018-04-15 LAB — CMP14+EGFR
A/G RATIO: 1.8 (ref 1.2–2.2)
ALT: 21 IU/L (ref 0–44)
AST: 32 IU/L (ref 0–40)
Albumin: 4.5 g/dL (ref 3.6–4.8)
Alkaline Phosphatase: 58 IU/L (ref 39–117)
BILIRUBIN TOTAL: 0.6 mg/dL (ref 0.0–1.2)
BUN/Creatinine Ratio: 11 (ref 10–24)
BUN: 14 mg/dL (ref 8–27)
CALCIUM: 9.5 mg/dL (ref 8.6–10.2)
CHLORIDE: 101 mmol/L (ref 96–106)
CO2: 21 mmol/L (ref 20–29)
Creatinine, Ser: 1.27 mg/dL (ref 0.76–1.27)
GFR calc non Af Amer: 59 mL/min/{1.73_m2} — ABNORMAL LOW (ref >59)
GFR, EST AFRICAN AMERICAN: 68 mL/min/{1.73_m2} (ref >59)
GLUCOSE: 113 mg/dL — AB (ref 65–99)
Globulin, Total: 2.5 g/dL (ref 1.5–4.5)
POTASSIUM: 4.6 mmol/L (ref 3.5–5.2)
Sodium: 138 mmol/L (ref 134–144)
TOTAL PROTEIN: 7 g/dL (ref 6.0–8.5)

## 2018-04-15 LAB — LIPID PANEL
Chol/HDL Ratio: 3.5 ratio (ref 0.0–5.0)
Cholesterol, Total: 107 mg/dL (ref 100–199)
HDL: 31 mg/dL — AB (ref >39)
LDL Calculated: 55 mg/dL (ref 0–99)
TRIGLYCERIDES: 104 mg/dL (ref 0–149)
VLDL CHOLESTEROL CAL: 21 mg/dL (ref 5–40)

## 2018-04-15 LAB — BAYER DCA HB A1C WAIVED: HB A1C: 7.2 % — AB (ref <7.0)

## 2018-04-15 MED ORDER — ATORVASTATIN CALCIUM 40 MG PO TABS: 40.0000 mg | ORAL_TABLET | Freq: Every day | ORAL | 1 refills | Status: DC

## 2018-04-15 MED ORDER — METFORMIN HCL 1000 MG PO TABS: ORAL_TABLET | ORAL | 1 refills | Status: DC

## 2018-04-15 MED ORDER — GEMFIBROZIL 600 MG PO TABS: ORAL_TABLET | ORAL | 1 refills | Status: DC

## 2018-04-15 MED ORDER — GLIPIZIDE 10 MG PO TABS: ORAL_TABLET | ORAL | 1 refills | Status: DC

## 2018-04-15 MED ORDER — TRAZODONE HCL 100 MG PO TABS: 100.0000 mg | ORAL_TABLET | Freq: Every day | ORAL | 1 refills | Status: DC

## 2018-04-15 MED ORDER — ISOSORBIDE MONONITRATE ER 30 MG PO TB24: 30.0000 mg | ORAL_TABLET | Freq: Every day | ORAL | 1 refills | Status: DC

## 2018-04-15 NOTE — Patient Instructions (Signed)
Diabetes Mellitus and Nutrition When you have diabetes (diabetes mellitus), it is very important to have healthy eating habits because your blood sugar (glucose) levels are greatly affected by what you eat and drink. Eating healthy foods in the appropriate amounts, at about the same times every day, can help you:  Control your blood glucose.  Lower your risk of heart disease.  Improve your blood pressure.  Reach or maintain a healthy weight.  Every person with diabetes is different, and each person has different needs for a meal plan. Your health care provider may recommend that you work with a diet and nutrition specialist (dietitian) to make a meal plan that is best for you. Your meal plan may vary depending on factors such as:  The calories you need.  The medicines you take.  Your weight.  Your blood glucose, blood pressure, and cholesterol levels.  Your activity level.  Other health conditions you have, such as heart or kidney disease.  How do carbohydrates affect me? Carbohydrates affect your blood glucose level more than any other type of food. Eating carbohydrates naturally increases the amount of glucose in your blood. Carbohydrate counting is a method for keeping track of how many carbohydrates you eat. Counting carbohydrates is important to keep your blood glucose at a healthy level, especially if you use insulin or take certain oral diabetes medicines. It is important to know how many carbohydrates you can safely have in each meal. This is different for every person. Your dietitian can help you calculate how many carbohydrates you should have at each meal and for snack. Foods that contain carbohydrates include:  Bread, cereal, rice, pasta, and crackers.  Potatoes and corn.  Peas, beans, and lentils.  Milk and yogurt.  Fruit and juice.  Desserts, such as cakes, cookies, ice cream, and candy.  How does alcohol affect me? Alcohol can cause a sudden decrease in blood  glucose (hypoglycemia), especially if you use insulin or take certain oral diabetes medicines. Hypoglycemia can be a life-threatening condition. Symptoms of hypoglycemia (sleepiness, dizziness, and confusion) are similar to symptoms of having too much alcohol. If your health care provider says that alcohol is safe for you, follow these guidelines:  Limit alcohol intake to no more than 1 drink per day for nonpregnant women and 2 drinks per day for men. One drink equals 12 oz of beer, 5 oz of wine, or 1 oz of hard liquor.  Do not drink on an empty stomach.  Keep yourself hydrated with water, diet soda, or unsweetened iced tea.  Keep in mind that regular soda, juice, and other mixers may contain a lot of sugar and must be counted as carbohydrates.  What are tips for following this plan? Reading food labels  Start by checking the serving size on the label. The amount of calories, carbohydrates, fats, and other nutrients listed on the label are based on one serving of the food. Many foods contain more than one serving per package.  Check the total grams (g) of carbohydrates in one serving. You can calculate the number of servings of carbohydrates in one serving by dividing the total carbohydrates by 15. For example, if a food has 30 g of total carbohydrates, it would be equal to 2 servings of carbohydrates.  Check the number of grams (g) of saturated and trans fats in one serving. Choose foods that have low or no amount of these fats.  Check the number of milligrams (mg) of sodium in one serving. Most people   should limit total sodium intake to less than 2,300 mg per day.  Always check the nutrition information of foods labeled as "low-fat" or "nonfat". These foods may be higher in added sugar or refined carbohydrates and should be avoided.  Talk to your dietitian to identify your daily goals for nutrients listed on the label. Shopping  Avoid buying canned, premade, or processed foods. These  foods tend to be high in fat, sodium, and added sugar.  Shop around the outside edge of the grocery store. This includes fresh fruits and vegetables, bulk grains, fresh meats, and fresh dairy. Cooking  Use low-heat cooking methods, such as baking, instead of high-heat cooking methods like deep frying.  Cook using healthy oils, such as olive, canola, or sunflower oil.  Avoid cooking with butter, cream, or high-fat meats. Meal planning  Eat meals and snacks regularly, preferably at the same times every day. Avoid going long periods of time without eating.  Eat foods high in fiber, such as fresh fruits, vegetables, beans, and whole grains. Talk to your dietitian about how many servings of carbohydrates you can eat at each meal.  Eat 4-6 ounces of lean protein each day, such as lean meat, chicken, fish, eggs, or tofu. 1 ounce is equal to 1 ounce of meat, chicken, or fish, 1 egg, or 1/4 cup of tofu.  Eat some foods each day that contain healthy fats, such as avocado, nuts, seeds, and fish. Lifestyle   Check your blood glucose regularly.  Exercise at least 30 minutes 5 or more days each week, or as told by your health care provider.  Take medicines as told by your health care provider.  Do not use any products that contain nicotine or tobacco, such as cigarettes and e-cigarettes. If you need help quitting, ask your health care provider.  Work with a counselor or diabetes educator to identify strategies to manage stress and any emotional and social challenges. What are some questions to ask my health care provider?  Do I need to meet with a diabetes educator?  Do I need to meet with a dietitian?  What number can I call if I have questions?  When are the best times to check my blood glucose? Where to find more information:  American Diabetes Association: diabetes.org/food-and-fitness/food  Academy of Nutrition and Dietetics:  www.eatright.org/resources/health/diseases-and-conditions/diabetes  National Institute of Diabetes and Digestive and Kidney Diseases (NIH): www.niddk.nih.gov/health-information/diabetes/overview/diet-eating-physical-activity Summary  A healthy meal plan will help you control your blood glucose and maintain a healthy lifestyle.  Working with a diet and nutrition specialist (dietitian) can help you make a meal plan that is best for you.  Keep in mind that carbohydrates and alcohol have immediate effects on your blood glucose levels. It is important to count carbohydrates and to use alcohol carefully. This information is not intended to replace advice given to you by your health care provider. Make sure you discuss any questions you have with your health care provider. Document Released: 01/18/2005 Document Revised: 05/28/2016 Document Reviewed: 05/28/2016 Elsevier Interactive Patient Education  2018 [employer].  

## 2018-04-15 NOTE — Progress Notes (Signed)
Subjective:    Patient ID: Edward [name], male    DOB: 10/06/1950, 67 y.o.   MRN: [id number]   Chief Complaint: medical management of chronic issues  HPI:  1. Essential hypertension  No c/o chest pain, sob or headache. Does not check blood pressure at home. BP Readings from Last 3 Encounters:  03/17/18 128/68  01/30/18 138/74  10/29/17 129/77     2. Type 2 diabetes mellitus with stage 3 chronic kidney disease, without long-term current use of insulin ([place]) last hgba1c was 6.6%. Blood sugars have been running around 140 fasting.he has had a couple of readins around 200. No symptoms of hypoglycemia.  3. CKD (chronic kidney disease) stage 3, GFR 30-59 ml/min (HCC)  We are currently monitoring labs. Last creatine was 1.36  4. Primary insomnia  trazadone works well to help sleep at night.  5. Mixed hyperlipidemia  He really doe snot watch diet and does very little exercise.    Outpatient Encounter Medications as of 04/15/2018  Medication Sig  . ACCU-CHEK AVIVA PLUS test strip USE TO CHECK BLOOD SUGAR ONCE DAILY  . aspirin 81 MG tablet Take 81 mg by mouth daily.    [name] atorvastatin (LIPITOR) 40 MG tablet Take 1 tablet (40 mg total) by mouth daily.  . brimonidine (ALPHAGAN) 0.15 % ophthalmic solution Place 1 drop into both eyes 2 (two) times daily.  . Cholecalciferol (VITAMIN D3) 2000 UNITS TABS Take 1 tablet by mouth daily.  . fish oil-omega-3 fatty acids 1000 MG capsule Take 1 g by mouth 2 (two) times daily.    [name] gemfibrozil (LOPID) 600 MG tablet TAKE 1 TABLET BY MOUTH TWICE DAILY BEFORE A MEAL  . glipiZIDE (GLUCOTROL) 10 MG tablet TAKE 1 TABLET BY MOUTH TWICE DAILY BEFORE MEAL(S)  . isosorbide mononitrate (IMDUR) 30 MG 24 hr tablet Take 1 tablet (30 mg total) by mouth daily. New 01/30/2018  . metFORMIN (GLUCOPHAGE) 1000 MG tablet 1/2 tablet in AM and whole tablet in evening  . metoprolol tartrate (LOPRESSOR) 25 MG tablet Take 1 tablet (25 mg total) by mouth 2 (two) times daily.  .  nitroGLYCERIN (NITROSTAT) 0.4 MG SL tablet Place 1 tablet (0.4 mg total) under the tongue every 5 (five) minutes as needed.  . traZODone (DESYREL) 100 MG tablet Take 1 tablet (100 mg total) by mouth at bedtime.       New complaints: none today  Social history: Lives by hisself and his children check on him daily. He is retired from [employer].   Review of Systems  Constitutional: Negative for activity change and appetite change.  HENT: Negative.   Eyes: Negative for pain.  Respiratory: Negative for shortness of breath.   Cardiovascular: Negative for chest pain, palpitations and leg swelling.  Gastrointestinal: Negative for abdominal pain.  Endocrine: Negative for polydipsia.  Genitourinary: Negative.   Skin: Negative for rash.  Neurological: Negative for dizziness, weakness and headaches.  Hematological: Does not bruise/bleed easily.  Psychiatric/Behavioral: Negative.   All other systems reviewed and are negative.      Objective:   Physical Exam  Constitutional: He is oriented to person, place, and time. He appears well-developed and well-nourished.  HENT:  Head: Normocephalic.  Nose: Nose normal.  Mouth/Throat: Oropharynx is clear and moist.  Eyes: Pupils are equal, round, and reactive to light. EOM are normal.  Neck: Normal range of motion and phonation normal. Neck supple. No JVD present. Carotid bruit is not present. No thyroid mass and no thyromegaly present.  Cardiovascular:  Normal rate and regular rhythm.  Pulmonary/Chest: Effort normal and breath sounds normal. No respiratory distress.  Abdominal: Soft. Normal appearance, normal aorta and bowel sounds are normal. There is no tenderness.  Musculoskeletal: Normal range of motion.  Lymphadenopathy:    He has no cervical adenopathy.  Neurological: He is alert and oriented to person, place, and time.  Skin: Skin is warm and dry.  Psychiatric: He has a normal mood and affect. His behavior is normal. Judgment and  thought content normal.  Nursing note and vitals reviewed.  BP 129/70   Pulse 68   Temp (!) 97.5 F (36.4 C) (Oral)   Ht 5' 10"  (1.778 m)   Wt 159 lb (72.1 kg)   BMI 22.81 kg/m   hgba1c 7.2%        Assessment & Plan:  Edward [name] comes in today with chief complaint of Medical Management of Chronic Issues   Diagnosis and orders addressed:  1. Essential hypertension Low sodium diet - CMP14+EGFR  2. Type 2 diabetes mellitus with stage 3 chronic kidney disease, without long-term current use of insulin (HCC) Stricter carb counting - Bayer DCA Hb A1c Waived - metFORMIN (GLUCOPHAGE) 1000 MG tablet; 1/2 tablet in AM and whole tablet in evening  Dispense: 180 tablet; Refill: 1 - glipiZIDE (GLUCOTROL) 10 MG tablet; TAKE 1 TABLET BY MOUTH TWICE DAILY BEFORE MEAL(S)  Dispense: 180 tablet; Refill: 1  3. CKD (chronic kidney disease) stage 3, GFR 30-59 ml/min (HCC) Labs pending  4. Primary insomnia Bedtime routine - traZODone (DESYREL) 100 MG tablet; Take 1 tablet (100 mg total) by mouth at bedtime.  Dispense: 90 tablow sodium dietlet; Refill: 1  5. Mixed hyperlipidemia Low fat diet  - Lipid panel - atorvastatin (LIPITOR) 40 MG tablet; Take 1 tablet (40 mg total) by mouth daily.  Dispense: 90 tablet; Refill: 1   Labs pending Health Maintenance reviewed Diet and exercise encouraged  Follow up plan: 3 months   Mary-Margaret Hassell Done, FNP

## 2018-04-25 ENCOUNTER — Ambulatory Visit: Payer: Medicare Other | Admitting: Cardiology

## 2018-05-14 DIAGNOSIS — N183 Chronic kidney disease, stage 3 (moderate): Secondary | ICD-10-CM | POA: Diagnosis not present

## 2018-05-14 DIAGNOSIS — R809 Proteinuria, unspecified: Secondary | ICD-10-CM | POA: Diagnosis not present

## 2018-05-14 DIAGNOSIS — E559 Vitamin D deficiency, unspecified: Secondary | ICD-10-CM | POA: Diagnosis not present

## 2018-05-14 DIAGNOSIS — Z79899 Other long term (current) drug therapy: Secondary | ICD-10-CM | POA: Diagnosis not present

## 2018-05-14 DIAGNOSIS — D509 Iron deficiency anemia, unspecified: Secondary | ICD-10-CM | POA: Diagnosis not present

## 2018-05-14 DIAGNOSIS — I10 Essential (primary) hypertension: Secondary | ICD-10-CM | POA: Diagnosis not present

## 2018-05-17 ENCOUNTER — Other Ambulatory Visit: Payer: Self-pay | Admitting: Cardiology

## 2018-06-02 ENCOUNTER — Ambulatory Visit (INDEPENDENT_AMBULATORY_CARE_PROVIDER_SITE_OTHER): Payer: Medicare Other | Admitting: Nurse Practitioner

## 2018-06-02 ENCOUNTER — Encounter: Payer: Self-pay | Admitting: Nurse Practitioner

## 2018-06-02 ENCOUNTER — Ambulatory Visit: Payer: Medicare Other | Admitting: Cardiology

## 2018-06-02 VITALS — BP 122/75 | HR 90 | Temp 97.0°F | Ht 70.0 in | Wt 165.0 lb

## 2018-06-02 DIAGNOSIS — Z0000 Encounter for general adult medical examination without abnormal findings: Secondary | ICD-10-CM

## 2018-06-02 NOTE — Patient Instructions (Signed)
What are Advance Directives? A living will allows you to document your wishes concerning medical treatments at the end of life.   Before your living will can guide medical decision-making two physicians must certify: You are unable to make medical decisions,  You are in the medical condition specified in the state's living will law (such as "terminal illness" or "permanent unconsciousness"),  Other requirements also may apply, depending upon the state. A medical power of attorney (or healthcare proxy) allows you to appoint a person you trust as your healthcare agent (or surrogate decision maker), who is authorized to make medical decisions on your behalf.   Before a medical power of attorney goes into effect a person's physician must conclude that they are unable to make their own medical decisions. In addition: If a person regains the ability to make decisions, the agent cannot continue to act on the person's behalf.  Many states have additional requirements that apply only to decisions about life-sustaining medical treatments.  For example, before your agent can refuse a life-sustaining treatment on your behalf, a second physician may have to confirm your doctor's assessment that you are incapable of making treatment decisions. What Else Do I Need to Know?  Advance directives are legally valid throughout the [place]. While you do not need a lawyer to fill out an advance directive, your advance directive becomes legally valid as soon as you sign them in front of the required witnesses. The laws governing advance directives vary from state to state, so it is important to complete and sign advance directives that comply with your state's law. Also, advance directives can have different titles in different states.  Emergency medical technicians cannot honor living wills or medical powers of attorney. Once emergency personnel have been called, they must do what is necessary to stabilize a person  for transfer to a hospital, both from accident sites and from a home or other facility. After a physician fully evaluates the person's condition and determines the underlying conditions, advance directives can be implemented.  One state's advance directive does not always work in another state. Some states do honor advance directives from another state; others will honor out-of-state advance directives as long as they are similar to the state's own law; and some states do not have an answer to this question. The best solution is if you spend a significant amount of time in more than one state, you should complete the advance directives for all the states you spend a significant amount of time in.  Advance directives do not expire. An advance directive remains in effect until you change it. If you complete a new advance directive, it invalidates the previous one.  You should review your advance directives periodically to ensure that they still reflect your wishes. If you want to change anything in an advance directive once you have completed it, you should complete a whole new document. SearcPRIVATE "TYPE=PICT;ALT="    [facility], [email or web address]

## 2018-06-02 NOTE — Progress Notes (Signed)
Subjective:   Edward [name] is a 68 y.o. male who presents for a Welcome to Medicare exam.   Review of Systems: Review of Systems  Constitutional: Negative.   HENT: Negative.   Respiratory: Negative.   Cardiovascular: Negative.   Genitourinary: Negative.   Musculoskeletal: Negative.   Skin: Negative.   Neurological: Negative.   Psychiatric/Behavioral: Negative.   All other systems reviewed and are negative.  Doing well today without complaints. Last follow up was done 04/15/18       Objective:    Today's Vitals   06/02/18 0913  BP: 122/75  Pulse: 90  Temp: (!) 97 F (36.1 C)  TempSrc: Oral  Weight: 165 lb (74.8 kg)  Height: 5\' 10"  (1.778 m)   Body mass index is 23.68 kg/m.  Medications Outpatient Encounter Medications as of 06/02/2018  Medication Sig  . ACCU-CHEK AVIVA PLUS test strip USE TO CHECK BLOOD SUGAR ONCE DAILY  . aspirin 81 MG tablet Take 81 mg by mouth daily.    [name] atorvastatin (LIPITOR) 40 MG tablet Take 1 tablet (40 mg total) by mouth daily.  . brimonidine (ALPHAGAN) 0.15 % ophthalmic solution Place 1 drop into both eyes 2 (two) times daily.  . Cholecalciferol (VITAMIN D3) 2000 UNITS TABS Take 1 tablet by mouth daily.  . fish oil-omega-3 fatty acids 1000 MG capsule Take 1 g by mouth 2 (two) times daily.    [name] gemfibrozil (LOPID) 600 MG tablet TAKE 1 TABLET BY MOUTH TWICE DAILY BEFORE A MEAL  . glipiZIDE (GLUCOTROL) 10 MG tablet TAKE 1 TABLET BY MOUTH TWICE DAILY BEFORE MEAL(S)  . isosorbide mononitrate (IMDUR) 30 MG 24 hr tablet Take 1 tablet (30 mg total) by mouth daily. New 01/30/2018  . metFORMIN (GLUCOPHAGE) 1000 MG tablet 1/2 tablet in AM and whole tablet in evening  . metoprolol tartrate (LOPRESSOR) 25 MG tablet Take 1 tablet (25 mg total) by mouth 2 (two) times daily.  . nitroGLYCERIN (NITROSTAT) 0.4 MG SL tablet Place 1 tablet (0.4 mg total) under the tongue every 5 (five) minutes x 3 doses as needed for chest pain (if no relief after 3rd dose,  proceed to the ED for an evaluation).  . traZODone (DESYREL) 100 MG tablet Take 1 tablet (100 mg total) by mouth at bedtime.   No facility-administered encounter medications on file as of 06/02/2018.      History: Past Medical History:  Diagnosis Date  . Cataract   . CKD (chronic kidney disease) stage 3, GFR 30-59 ml/min (HCC)   . Coronary atherosclerosis of native coronary artery    BMS LAD 2006; residual 75% distal RCA; EF 50%  . Essential hypertension, benign   . Glaucoma   . Low back pain   . MI (myocardial infarction) ([place])    Anterior 2006  . Mixed hyperlipidemia   . Renal insufficiency   . Type 2 diabetes mellitus ([place])    Past Surgical History:  Procedure Laterality Date  . CORONARY ANGIOPLASTY WITH STENT PLACEMENT  2006    Family History  Problem Relation Age of Onset  . Aneurysm Father        Brain aneurysm  . Lung cancer Mother        Small cell carcinoma of lung,kidney and heart  . Heart disease Mother   . Cancer Brother        lungs  . Diabetes Sister   . Diabetes Sister   . Diabetes Brother   . Diabetes Brother    Social  History   Occupational History  . Not on file  Tobacco Use  . Smoking status: Former Smoker    Packs/day: 2.00    Years: 20.00    Pack years: 40.00    Types: Cigarettes    Last attempt to quit: 05/08/1987    Years since quitting: 31.0  . Smokeless tobacco: Never Used  Substance and Sexual Activity  . Alcohol use: No    Alcohol/week: 0.0 standard drinks  . Drug use: No  . Sexual activity: Not on file   Tobacco Counseling Stopped smoking over 30 years ago  Immunizations and Health Maintenance Immunization History  Administered Date(s) Administered  . Influenza, High Dose Seasonal PF 02/03/2018  . Influenza,inj,Quad PF,6+ Mos 03/08/2014, 04/18/2015, 03/05/2016, 01/28/2017  . Pneumococcal Conjugate-13 08/05/2015  . Td 05/07/2004  . Tdap 01/13/2015   Health Maintenance Due  Topic Date Due  . HIV Screening  08/25/1967     Activities of Daily Living Able to perform all activities of daily living on his own. Lives alone  Physical Exam  No physical exam oerformed- last EKG done 01/30/18- sinus bradycardia.   Advanced Directives:  does not have - information given    Assessment:    This is a routine wellness  examination for this patient .   Vision/Hearing screen Both normal- no correction  Dietary issues and exercise activities discussed:   weiht good- BMI 23    Depression Screen PHQ 2/9 Scores 06/02/2018 04/15/2018 07/26/2017 04/25/2017  PHQ - 2 Score 0 0 0 0  PHQ- 9 Score - - - -     Fall Risk Fall Risk  06/02/2018  Falls in the past year? 0    Cognitive Function  answers all questions appropriately Alert and oriented X3      Patient Care Team: [name], FNP as PCP - General (Nurse Practitioner) Satira Sark, MD as PCP - Cardiology (Cardiology)     Plan:    welcome to medicare exam  I have personally reviewed and noted the following in the patient's chart:   . Medical and social history . Use of alcohol, tobacco or illicit drugs  . Current medications and supplements . Functional ability and status . Nutritional status . Physical activity . Advanced directives . List of other physicians . Hospitalizations, surgeries, and ER visits in previous 12 months . Vitals . Screenings to include cognitive, depression, and falls . Referrals and appointments  In addition, I have reviewed and discussed with patient certain preventive protocols, quality metrics, and best practice recommendations. A written personalized care plan for preventive services as well as general preventive health recommendations were provided to patient.    [place], [place] 06/02/2018

## 2018-06-04 NOTE — Progress Notes (Signed)
Cardiology Office Note  Date: 06/05/2018   ID: Edward [name], DOB [birth year], MRN [id number]  PCP: [name], [place]  Primary Cardiologist: Rozann Lesches, MD   Chief Complaint  Patient presents with  . Coronary Artery Disease    History of Present Illness: Edward [name] is a 68 y.o. male last seen in November 2019.  He is here with his wife for a follow-up visit. Imdur was increased to 30 mg daily at the last visit.  He preferred to hold off on cardiac catheterization at that time.  He states that he has done well, no progressive angina since we made the switch.  Lexiscan Myoview in October 2019 revealed a region of anterior/anteroseptal scar as well as a region of inferior/inferolateral scar with moderate peri-infarct ischemia.  LVEF was 53%. He is several years out from BMS to the LAD in 2006, also had residual distal RCA disease that was managed medically.  Past Medical History:  Diagnosis Date  . Cataract   . CKD (chronic kidney disease) stage 3, GFR 30-59 ml/min (HCC)   . Coronary atherosclerosis of native coronary artery    BMS LAD 2006; residual 75% distal RCA; EF 50%  . Essential hypertension, benign   . Glaucoma   . Low back pain   . MI (myocardial infarction) ([place])    Anterior 2006  . Mixed hyperlipidemia   . Renal insufficiency   . Type 2 diabetes mellitus ([place])     Past Surgical History:  Procedure Laterality Date  . CORONARY ANGIOPLASTY WITH STENT PLACEMENT  2006    Current Outpatient Medications  Medication Sig Dispense Refill  . ACCU-CHEK AVIVA PLUS test strip USE TO CHECK BLOOD SUGAR ONCE DAILY 100 each 3  . aspirin 81 MG tablet Take 81 mg by mouth daily.      [name] atorvastatin (LIPITOR) 40 MG tablet Take 1 tablet (40 mg total) by mouth daily. 90 tablet 1  . brimonidine (ALPHAGAN) 0.15 % ophthalmic solution Place 1 drop into both eyes 2 (two) times daily.    . Cholecalciferol (VITAMIN D3) 2000 UNITS TABS Take 1 tablet by mouth daily.    .  fish oil-omega-3 fatty acids 1000 MG capsule Take 1 g by mouth 2 (two) times daily.      [name] gemfibrozil (LOPID) 600 MG tablet TAKE 1 TABLET BY MOUTH TWICE DAILY BEFORE A MEAL 180 tablet 1  . glipiZIDE (GLUCOTROL) 10 MG tablet TAKE 1 TABLET BY MOUTH TWICE DAILY BEFORE MEAL(S) 180 tablet 1  . isosorbide mononitrate (IMDUR) 30 MG 24 hr tablet Take 1 tablet (30 mg total) by mouth daily. New 01/30/2018 90 tablet 1  . metFORMIN (GLUCOPHAGE) 1000 MG tablet 1/2 tablet in AM and whole tablet in evening 180 tablet 1  . metoprolol tartrate (LOPRESSOR) 25 MG tablet Take 1 tablet (25 mg total) by mouth 2 (two) times daily. 180 tablet 1  . nitroGLYCERIN (NITROSTAT) 0.4 MG SL tablet Place 1 tablet (0.4 mg total) under the tongue every 5 (five) minutes x 3 doses as needed for chest pain (if no relief after 3rd dose, proceed to the ED for an evaluation). 25 tablet 1  . traZODone (DESYREL) 100 MG tablet Take 1 tablet (100 mg total) by mouth at bedtime. 90 tablet 1   No current facility-administered medications for this visit.    Allergies:  Patient has no known allergies.   Social History: The patient  reports that he quit smoking about 31 years ago. His smoking use  included cigarettes. He has a 40.00 pack-year smoking history. He has never used smokeless tobacco. He reports that he does not drink alcohol or use drugs.   ROS:  Please see the history of present illness. Otherwise, complete review of systems is positive for none.  All other systems are reviewed and negative.   Physical Exam: VS:  BP 112/63   Pulse (!) 55   Ht 5\' 10"  (1.778 m)   Wt 161 lb 12.8 oz (73.4 kg)   SpO2 96%   BMI 23.22 kg/m , BMI Body mass index is 23.22 kg/m.  Wt Readings from Last 3 Encounters:  06/05/18 161 lb 12.8 oz (73.4 kg)  06/02/18 165 lb (74.8 kg)  04/15/18 159 lb (72.1 kg)    General: Patient appears comfortable at rest. HEENT: Conjunctiva and lids normal, oropharynx clear. Neck: Supple, no elevated JVP or carotid  bruits, no thyromegaly. Lungs: Clear to auscultation, nonlabored breathing at rest. Cardiac: Regular rate and rhythm, no S3 or significant systolic murmur, no pericardial rub. Abdomen: Soft, nontender, bowel sounds present. Extremities: No pitting edema, distal pulses 2+.   ECG: I personally reviewed the tracing from 9/26 last 2019 which showed sinus bradycardia with low voltage and poor R wave progression.  Recent Labwork: 04/15/2018: ALT 21; AST 32; BUN 14; Creatinine, Ser 1.27; Potassium 4.6; Sodium 138     Component Value Date/Time   CHOL 107 04/15/2018 0856   CHOL 177 09/25/2012 1015   TRIG 104 04/15/2018 0856   TRIG 206 (H) 10/07/2014 0927   TRIG 269 (H) 09/25/2012 1015   HDL 31 (L) 04/15/2018 0856   HDL 22 (L) 10/07/2014 0927   HDL 28 (L) 09/25/2012 1015   CHOLHDL 3.5 04/15/2018 0856   LDLCALC 55 04/15/2018 0856   LDLCALC 98 12/21/2013 0850   LDLCALC 95 09/25/2012 1015    Other Studies Reviewed Today:  [facility] Myoview 02/04/2018:  There was no ST segment deviation noted during stress.  Defect 1: There is a medium defect of severe severity present in the mid anteroseptal, apical anterior, apical septal and apex location. This is consistent with myocardial scar.  Defect 2: There is a medium defect of moderate severity present in the mid inferolateral, apical inferior and apical lateral location. This is consistent with scar with a moderate degree of peri-infarct ischemia.  Findings consistent with prior myocardial infarction with moderate peri-infarct ischemia in the inferolateral wall.  This is an intermediate risk study.  Nuclear stress EF: 53%.  Assessment and Plan:  1.  CAD status post BMS to the LAD in 2006 with 75% distal RCA that was managed medically.  He reports adequate angina control since we increased Imdur to 30 mg daily.  We will continue with current plan, reserving cardiac catheterization for progressive symptoms.  2.  Mixed hyperlipidemia on  Lipitor.  Recent LDL 55.  Current medicines were reviewed with the patient today.  Disposition: Follow-up in 6 months, sooner if needed.  Signed, Satira Sark, MD, [facility] 06/05/2018 9:19 AM    [facility] [place], [place], [place] [address] Phone: [phone]; Fax: [phone]

## 2018-06-05 ENCOUNTER — Ambulatory Visit (INDEPENDENT_AMBULATORY_CARE_PROVIDER_SITE_OTHER): Payer: Medicare Other | Admitting: Cardiology

## 2018-06-05 ENCOUNTER — Encounter: Payer: Self-pay | Admitting: Cardiology

## 2018-06-05 VITALS — BP 112/63 | HR 55 | Ht 70.0 in | Wt 161.8 lb

## 2018-06-05 DIAGNOSIS — E782 Mixed hyperlipidemia: Secondary | ICD-10-CM

## 2018-06-05 DIAGNOSIS — I25119 Atherosclerotic heart disease of native coronary artery with unspecified angina pectoris: Secondary | ICD-10-CM

## 2018-06-05 NOTE — Patient Instructions (Addendum)

## 2018-07-18 ENCOUNTER — Ambulatory Visit: Payer: Medicare Other | Admitting: Nurse Practitioner

## 2018-08-15 ENCOUNTER — Other Ambulatory Visit: Payer: Self-pay | Admitting: Nurse Practitioner

## 2018-08-15 DIAGNOSIS — N183 Chronic kidney disease, stage 3 (moderate): Principal | ICD-10-CM

## 2018-08-15 DIAGNOSIS — E1122 Type 2 diabetes mellitus with diabetic chronic kidney disease: Secondary | ICD-10-CM

## 2018-08-15 DIAGNOSIS — I10 Essential (primary) hypertension: Secondary | ICD-10-CM

## 2018-08-28 ENCOUNTER — Other Ambulatory Visit: Payer: Self-pay

## 2018-08-28 MED ORDER — BLOOD GLUCOSE METER KIT: PACK | 0 refills | Status: DC

## 2018-10-14 ENCOUNTER — Other Ambulatory Visit: Payer: Self-pay | Admitting: Nurse Practitioner

## 2018-10-14 DIAGNOSIS — E1122 Type 2 diabetes mellitus with diabetic chronic kidney disease: Secondary | ICD-10-CM

## 2018-10-14 NOTE — Telephone Encounter (Signed)
MMM. NTBS 3 mos fu was in March

## 2018-10-15 ENCOUNTER — Other Ambulatory Visit: Payer: Self-pay | Admitting: Nurse Practitioner

## 2018-10-15 DIAGNOSIS — N183 Chronic kidney disease, stage 3 unspecified: Secondary | ICD-10-CM

## 2018-10-15 DIAGNOSIS — E1122 Type 2 diabetes mellitus with diabetic chronic kidney disease: Secondary | ICD-10-CM

## 2018-10-15 NOTE — Telephone Encounter (Signed)
Call to schedule an appointment for refills.  You need lab work.

## 2018-10-15 NOTE — Telephone Encounter (Signed)
Aware, needs lab work and visit with provider for further refills.

## 2018-10-15 NOTE — Telephone Encounter (Signed)
MMM NTBS 3 mos ckup was in March

## 2018-10-16 ENCOUNTER — Other Ambulatory Visit: Payer: Self-pay | Admitting: Nurse Practitioner

## 2018-10-16 DIAGNOSIS — E1122 Type 2 diabetes mellitus with diabetic chronic kidney disease: Secondary | ICD-10-CM

## 2018-11-09 ENCOUNTER — Other Ambulatory Visit: Payer: Self-pay | Admitting: Nurse Practitioner

## 2018-11-10 ENCOUNTER — Encounter: Payer: Self-pay | Admitting: *Deleted

## 2018-11-10 ENCOUNTER — Other Ambulatory Visit: Payer: Self-pay | Admitting: Nurse Practitioner

## 2018-11-10 DIAGNOSIS — I10 Essential (primary) hypertension: Secondary | ICD-10-CM

## 2018-11-10 NOTE — Telephone Encounter (Signed)
Needs appt with PCP

## 2018-11-28 ENCOUNTER — Other Ambulatory Visit: Payer: Self-pay | Admitting: Nurse Practitioner

## 2018-11-28 DIAGNOSIS — F5101 Primary insomnia: Secondary | ICD-10-CM

## 2018-12-01 ENCOUNTER — Other Ambulatory Visit: Payer: Self-pay | Admitting: Nurse Practitioner

## 2018-12-01 DIAGNOSIS — F5101 Primary insomnia: Secondary | ICD-10-CM

## 2018-12-03 ENCOUNTER — Other Ambulatory Visit: Payer: Self-pay | Admitting: Nurse Practitioner

## 2018-12-03 DIAGNOSIS — F5101 Primary insomnia: Secondary | ICD-10-CM

## 2018-12-05 ENCOUNTER — Other Ambulatory Visit: Payer: Self-pay | Admitting: Nurse Practitioner

## 2018-12-05 DIAGNOSIS — F5101 Primary insomnia: Secondary | ICD-10-CM

## 2018-12-15 ENCOUNTER — Other Ambulatory Visit: Payer: Self-pay

## 2018-12-16 ENCOUNTER — Ambulatory Visit (INDEPENDENT_AMBULATORY_CARE_PROVIDER_SITE_OTHER): Payer: Medicare Other | Admitting: Nurse Practitioner

## 2018-12-16 ENCOUNTER — Encounter: Payer: Self-pay | Admitting: Nurse Practitioner

## 2018-12-16 VITALS — BP 132/75 | HR 70 | Temp 97.3°F | Ht 70.0 in | Wt 161.0 lb

## 2018-12-16 DIAGNOSIS — I10 Essential (primary) hypertension: Secondary | ICD-10-CM | POA: Diagnosis not present

## 2018-12-16 DIAGNOSIS — N183 Chronic kidney disease, stage 3 unspecified: Secondary | ICD-10-CM

## 2018-12-16 DIAGNOSIS — I25119 Atherosclerotic heart disease of native coronary artery with unspecified angina pectoris: Secondary | ICD-10-CM

## 2018-12-16 DIAGNOSIS — E782 Mixed hyperlipidemia: Secondary | ICD-10-CM | POA: Diagnosis not present

## 2018-12-16 DIAGNOSIS — E1122 Type 2 diabetes mellitus with diabetic chronic kidney disease: Secondary | ICD-10-CM

## 2018-12-16 DIAGNOSIS — F5101 Primary insomnia: Secondary | ICD-10-CM

## 2018-12-16 LAB — BAYER DCA HB A1C WAIVED: HB A1C (BAYER DCA - WAIVED): 7.3 % — ABNORMAL HIGH (ref <7.0)

## 2018-12-16 MED ORDER — ATORVASTATIN CALCIUM 40 MG PO TABS: 40.0000 mg | ORAL_TABLET | Freq: Every day | ORAL | 1 refills | Status: DC

## 2018-12-16 MED ORDER — GLIPIZIDE 10 MG PO TABS: 10.0000 mg | ORAL_TABLET | Freq: Two times a day (BID) | ORAL | 0 refills | Status: DC

## 2018-12-16 MED ORDER — METOPROLOL TARTRATE 25 MG PO TABS: 25.0000 mg | ORAL_TABLET | Freq: Two times a day (BID) | ORAL | 0 refills | Status: DC

## 2018-12-16 MED ORDER — METFORMIN HCL 1000 MG PO TABS: ORAL_TABLET | ORAL | 1 refills | Status: DC

## 2018-12-16 MED ORDER — ISOSORBIDE MONONITRATE ER 30 MG PO TB24: 30.0000 mg | ORAL_TABLET | Freq: Every day | ORAL | 1 refills | Status: DC

## 2018-12-16 MED ORDER — TRAZODONE HCL 100 MG PO TABS: 100.0000 mg | ORAL_TABLET | Freq: Every day | ORAL | 1 refills | Status: DC

## 2018-12-16 MED ORDER — GEMFIBROZIL 600 MG PO TABS: 600.0000 mg | ORAL_TABLET | Freq: Two times a day (BID) | ORAL | 1 refills | Status: DC

## 2018-12-16 NOTE — Progress Notes (Signed)
Subjective:    Patient ID: Edward [name], male    DOB: [birth year], 68 y.o.   MRN: [id number]   Chief Complaint: Medical Management of Chronic Issues    HPI:  1. Essential hypertension No c/o chest pain, sob or headaches. Does not check blood pressure at home. BP Readings from Last 3 Encounters:  12/16/18 132/75  06/05/18 112/63  06/02/18 122/75     2. Atherosclerosis of native coronary artery of native heart with angina pectoris [facility]) Saw cardiology in January 2020. According to office note, he was started on imdur. He has not had any chest pain since stated taking. He has needed his nitro 2x since January. He has follow up in the next few weeks.  3. Type 2 diabetes mellitus with stage 3 chronic kidney disease, without long-term current use of insulin (HCC) Last hgba1c was 7.2%. his fatons blood sugars are always below 120. denies any low blood sugars  4. Mixed hyperlipidemia Does not watch diet and does no exercise. Takes lipitor daily with no side effects.  5. CKD (chronic kidney disease) stage 3, GFR 30-59 ml/min (HCC) Last creatine was 1.27. we will recheck today  6. Primary insomnia Is on trazadone to sleep and works well for him.    Outpatient Encounter Medications as of 12/16/2018  Medication Sig  . ACCU-CHEK AVIVA PLUS test strip USE TO CHECK BLOOD SUGAR ONCE DAILY  . aspirin 81 MG tablet Take 81 mg by mouth daily.    [name] atorvastatin (LIPITOR) 40 MG tablet Take 1 tablet (40 mg total) by mouth daily.  . blood glucose meter kit and supplies Dispense based on patient and insurance preference. Use up to four times daily as directed. (FOR ICD-10 E10.9, E11.9).  . brimonidine (ALPHAGAN) 0.15 % ophthalmic solution Place 1 drop into both eyes 2 (two) times daily.  . Cholecalciferol (VITAMIN D3) 2000 UNITS TABS Take 1 tablet by mouth daily.  . fish oil-omega-3 fatty acids 1000 MG capsule Take 1 g by mouth 2 (two) times daily.    [name] gemfibrozil (LOPID) 600 MG tablet TAKE 1  TABLET BY MOUTH TWICE DAILY BEFORE A MEAL  . glipiZIDE (GLUCOTROL) 10 MG tablet TAKE 1 TABLET BY MOUTH TWICE DAILY BEFORE MEAL(S)  . metFORMIN (GLUCOPHAGE) 1000 MG tablet 1/2 tablet in AM and whole tablet in evening  . metoprolol tartrate (LOPRESSOR) 25 MG tablet Take 1 tablet (25 mg total) by mouth 2 (two) times daily. (Please make 6 mos July appt)  . nitroGLYCERIN (NITROSTAT) 0.4 MG SL tablet Place 1 tablet (0.4 mg total) under the tongue every 5 (five) minutes x 3 doses as needed for chest pain (if no relief after 3rd dose, proceed to the ED for an evaluation).  . traZODone (DESYREL) 100 MG tablet Take 1 tablet (100 mg total) by mouth at bedtime.  . isosorbide mononitrate (IMDUR) 30 MG 24 hr tablet Take 1 tablet (30 mg total) by mouth daily. New 01/30/2018     Past Surgical History:  Procedure Laterality Date  . CORONARY ANGIOPLASTY WITH STENT PLACEMENT  2006    Family History  Problem Relation Age of Onset  . Aneurysm Father        Brain aneurysm  . Lung cancer Mother        Small cell carcinoma of lung,kidney and heart  . Heart disease Mother   . Cancer Brother        lungs  . Diabetes Sister   . Diabetes Sister   . Diabetes  Brother   . Diabetes Brother     New complaints: None today  Social history: Lives by hisself. He has family that checks on him daily  Controlled substance contract: N/A    Review of Systems  Constitutional: Negative for activity change and appetite change.  HENT: Negative.   Eyes: Negative for pain.  Respiratory: Negative for shortness of breath.   Cardiovascular: Negative for chest pain, palpitations and leg swelling.  Gastrointestinal: Negative for abdominal pain.  Endocrine: Negative for polydipsia.  Genitourinary: Negative.   Skin: Negative for rash.  Neurological: Negative for dizziness, weakness and headaches.  Hematological: Does not bruise/bleed easily.  Psychiatric/Behavioral: Negative.   All other systems reviewed and are  negative.      Objective:   Physical Exam Vitals signs and nursing note reviewed.  Constitutional:      Appearance: Normal appearance. He is well-developed.  HENT:     Head: Normocephalic.     Nose: Nose normal.  Eyes:     Pupils: Pupils are equal, round, and reactive to light.  Neck:     Musculoskeletal: Normal range of motion and neck supple.     Thyroid: No thyroid mass or thyromegaly.     Vascular: No carotid bruit or JVD.     Trachea: Phonation normal.  Cardiovascular:     Rate and Rhythm: Normal rate and regular rhythm.  Pulmonary:     Effort: Pulmonary effort is normal. No respiratory distress.     Breath sounds: Normal breath sounds.  Abdominal:     General: Bowel sounds are normal.     Palpations: Abdomen is soft.     Tenderness: There is no abdominal tenderness.  Musculoskeletal: Normal range of motion.  Lymphadenopathy:     Cervical: No cervical adenopathy.  Skin:    General: Skin is warm and dry.  Neurological:     Mental Status: He is alert and oriented to person, place, and time.  Psychiatric:        Behavior: Behavior normal.        Thought Content: Thought content normal.        Judgment: Judgment normal.    BP 132/75   Pulse 70   Temp (!) 97.3 F (36.3 C) (Oral)   Ht 5' 10"  (1.778 m)   Wt 161 lb (73 kg)   BMI 23.10 kg/m         Assessment & Plan:  Edward [name] comes in today with chief complaint of Medical Management of Chronic Issues   Diagnosis and orders addressed:  1. Essential hypertension Low sodium diet - CMP14+EGFR - metoprolol tartrate (LOPRESSOR) 25 MG tablet; Take 1 tablet (25 mg total) by mouth 2 (two) times daily. (Please make 6 mos July appt)  Dispense: 180 tablet; Refill: 0  2. Atherosclerosis of native coronary artery of native heart with angina pectoris ([place]) Need to make follow up appointment with cardiology - isosorbide mononitrate (IMDUR) 30 MG 24 hr tablet; Take 1 tablet (30 mg total) by mouth daily. New  01/30/2018  Dispense: 90 tablet; Refill: 1  3. Type 2 diabetes mellitus with stage 3 chronic kidney disease, without long-term current use of insulin (HCC) Continue to watch carbs in diet - Bayer DCA Hb A1c Waived - Microalbumin / creatinine urine ratio - glipiZIDE (GLUCOTROL) 10 MG tablet; Take 1 tablet (10 mg total) by mouth 2 (two) times daily before a meal.  Dispense: 180 tablet; Refill: 0 - metFORMIN (GLUCOPHAGE) 1000 MG tablet; 1/2 tablet in AM  and whole tablet in evening  Dispense: 180 tablet; Refill: 1  4. Mixed hyperlipidemia Low fat diet - Lipid panel - atorvastatin (LIPITOR) 40 MG tablet; Take 1 tablet (40 mg total) by mouth daily.  Dispense: 90 tablet; Refill: 1 - gemfibrozil (LOPID) 600 MG tablet; Take 1 tablet (600 mg total) by mouth 2 (two) times daily before a meal.  Dispense: 180 tablet; Refill: 1  5. CKD (chronic kidney disease) stage 3, GFR 30-59 ml/min (HCC) Labs pending  6. Primary insomnia Bedtime routyine - traZODone (DESYREL) 100 MG tablet; Take 1 tablet (100 mg total) by mouth at bedtime.  Dispense: 90 tablet; Refill: 1   Labs pending Health Maintenance reviewed Diet and exercise encouraged  Follow up plan: 3 months   Mary-Margaret Hassell Done, FNP

## 2018-12-16 NOTE — Patient Instructions (Signed)
Diabetes Mellitus and Foot Care Foot care is an important part of your health, especially when you have diabetes. Diabetes may cause you to have problems because of poor blood flow (circulation) to your feet and legs, which can cause your skin to:  Become thinner and drier.  Break more easily.  Heal more slowly.  Peel and crack. You may also have nerve damage (neuropathy) in your legs and feet, causing decreased feeling in them. This means that you may not notice minor injuries to your feet that could lead to more serious problems. Noticing and addressing any potential problems early is the best way to prevent future foot problems. How to care for your feet Foot hygiene  Wash your feet daily with warm water and mild soap. Do not use hot water. Then, pat your feet and the areas between your toes until they are completely dry. Do not soak your feet as this can dry your skin.  Trim your toenails straight across. Do not dig under them or around the cuticle. File the edges of your nails with an emery board or nail file.  Apply a moisturizing lotion or petroleum jelly to the skin on your feet and to dry, brittle toenails. Use lotion that does not contain alcohol and is unscented. Do not apply lotion between your toes. Shoes and socks  Wear clean socks or stockings every day. Make sure they are not too tight. Do not wear knee-high stockings since they may decrease blood flow to your legs.  Wear shoes that fit properly and have enough cushioning. Always look in your shoes before you put them on to be sure there are no objects inside.  To break in new shoes, wear them for just a few hours a day. This prevents injuries on your feet. Wounds, scrapes, corns, and calluses  Check your feet daily for blisters, cuts, bruises, sores, and redness. If you cannot see the bottom of your feet, use a mirror or ask someone for help.  Do not cut corns or calluses or try to remove them with medicine.  If you  find a minor scrape, cut, or break in the skin on your feet, keep it and the skin around it clean and dry. You may clean these areas with mild soap and water. Do not clean the area with peroxide, alcohol, or iodine.  If you have a wound, scrape, corn, or callus on your foot, look at it several times a day to make sure it is healing and not infected. Check for: ? Redness, swelling, or pain. ? Fluid or blood. ? Warmth. ? Pus or a bad smell. General instructions  Do not cross your legs. This may decrease blood flow to your feet.  Do not use heating pads or hot water bottles on your feet. They may burn your skin. If you have lost feeling in your feet or legs, you may not know this is happening until it is too late.  Protect your feet from hot and cold by wearing shoes, such as at the beach or on hot pavement.  Schedule a complete foot exam at least once a year (annually) or more often if you have foot problems. If you have foot problems, report any cuts, sores, or bruises to your health care provider immediately. Contact a health care provider if:  You have a medical condition that increases your risk of infection and you have any cuts, sores, or bruises on your feet.  You have an injury that is not   healing.  You have redness on your legs or feet.  You feel burning or tingling in your legs or feet.  You have pain or cramps in your legs and feet.  Your legs or feet are numb.  Your feet always feel cold.  You have pain around a toenail. Get help right away if:  You have a wound, scrape, corn, or callus on your foot and: ? You have pain, swelling, or redness that gets worse. ? You have fluid or blood coming from the wound, scrape, corn, or callus. ? Your wound, scrape, corn, or callus feels warm to the touch. ? You have pus or a bad smell coming from the wound, scrape, corn, or callus. ? You have a fever. ? You have a red line going up your leg. Summary  Check your feet every day  for cuts, sores, red spots, swelling, and blisters.  Moisturize feet and legs daily.  Wear shoes that fit properly and have enough cushioning.  If you have foot problems, report any cuts, sores, or bruises to your health care provider immediately.  Schedule a complete foot exam at least once a year (annually) or more often if you have foot problems. This information is not intended to replace advice given to you by your health care provider. Make sure you discuss any questions you have with your health care provider. Document Released: 04/20/2000 Document Revised: 06/05/2017 Document Reviewed: 05/25/2016 Elsevier Patient Education  2020 Elsevier Inc.  

## 2018-12-17 LAB — CMP14+EGFR
ALT: 24 IU/L (ref 0–44)
AST: 39 IU/L (ref 0–40)
Albumin/Globulin Ratio: 1.7 (ref 1.2–2.2)
Albumin: 4.5 g/dL (ref 3.8–4.8)
Alkaline Phosphatase: 53 IU/L (ref 39–117)
BUN/Creatinine Ratio: 10 (ref 10–24)
BUN: 13 mg/dL (ref 8–27)
Bilirubin Total: 0.7 mg/dL (ref 0.0–1.2)
CO2: 20 mmol/L (ref 20–29)
Calcium: 9.5 mg/dL (ref 8.6–10.2)
Chloride: 101 mmol/L (ref 96–106)
Creatinine, Ser: 1.26 mg/dL (ref 0.76–1.27)
GFR calc Af Amer: 68 mL/min/{1.73_m2} (ref >59)
GFR calc non Af Amer: 59 mL/min/{1.73_m2} — ABNORMAL LOW (ref >59)
Globulin, Total: 2.6 g/dL (ref 1.5–4.5)
Glucose: 150 mg/dL — ABNORMAL HIGH (ref 65–99)
Potassium: 4.4 mmol/L (ref 3.5–5.2)
Sodium: 138 mmol/L (ref 134–144)
Total Protein: 7.1 g/dL (ref 6.0–8.5)

## 2018-12-17 LAB — LIPID PANEL
Chol/HDL Ratio: 4 ratio (ref 0.0–5.0)
Cholesterol, Total: 120 mg/dL (ref 100–199)
HDL: 30 mg/dL — ABNORMAL LOW (ref >39)
LDL Calculated: 67 mg/dL (ref 0–99)
Triglycerides: 113 mg/dL (ref 0–149)
VLDL Cholesterol Cal: 23 mg/dL (ref 5–40)

## 2018-12-17 LAB — MICROALBUMIN / CREATININE URINE RATIO
Creatinine, Urine: 121 mg/dL
Microalb/Creat Ratio: 46 mg/g creat — ABNORMAL HIGH (ref 0–29)
Microalbumin, Urine: 55.9 ug/mL

## 2018-12-17 MED ORDER — LISINOPRIL 10 MG PO TABS: 10.0000 mg | ORAL_TABLET | Freq: Every day | ORAL | 3 refills | Status: DC

## 2018-12-17 NOTE — Addendum Note (Signed)
Addended by: [name] on: 12/17/2018 01:21 PM   Modules accepted: Orders

## 2018-12-31 ENCOUNTER — Ambulatory Visit (INDEPENDENT_AMBULATORY_CARE_PROVIDER_SITE_OTHER): Payer: Medicare Other | Admitting: Cardiology

## 2018-12-31 ENCOUNTER — Encounter: Payer: Self-pay | Admitting: Cardiology

## 2018-12-31 VITALS — BP 124/72 | HR 68 | Temp 98.8°F | Ht 70.0 in | Wt 160.0 lb

## 2018-12-31 DIAGNOSIS — E782 Mixed hyperlipidemia: Secondary | ICD-10-CM

## 2018-12-31 DIAGNOSIS — I10 Essential (primary) hypertension: Secondary | ICD-10-CM

## 2018-12-31 DIAGNOSIS — N183 Chronic kidney disease, stage 3 unspecified: Secondary | ICD-10-CM

## 2018-12-31 DIAGNOSIS — I25119 Atherosclerotic heart disease of native coronary artery with unspecified angina pectoris: Secondary | ICD-10-CM | POA: Diagnosis not present

## 2018-12-31 NOTE — Progress Notes (Signed)
Cardiology Office Note  Date: 12/31/2018   ID: Edward [name], DOB [birth year], MRN [id number]  PCP:  [name], FNP  Cardiologist:  Rozann Lesches, MD Electrophysiologist:  None   Chief Complaint  Patient presents with  . Cardiac follow-up    History of Present Illness: Edward [name] is a 68 y.o. male last seen in January.  He is here for a routine visit.  He tells me that he has had adequate angina control, only occasional use of as needed nitroglycerin.  The addition of Imdur previously seems to be very effective.  Lexiscan Myoview in October 2019 revealed a region of anterior, anteroseptal scar as well as a region of inferior/inferolateral scar with moderate peri-infarct ischemia. LVEF was 53%. He is several years out from BMS to the LAD in 2006, also had residual distal RCA disease that was managed medically.  He still prefers to hold off on cardiac catheterization unless symptoms worsen.  We went over his medications which are outlined below.  He reports compliance and no obvious intolerances.  I personally reviewed his ECG today which shows sinus bradycardia with decreased R wave progression and low voltage.  Past Medical History:  Diagnosis Date  . Cataract   . CKD (chronic kidney disease) stage 3, GFR 30-59 ml/min (HCC)   . Coronary atherosclerosis of native coronary artery    BMS LAD 2006; residual 75% distal RCA; EF 50%  . Essential hypertension, benign   . Glaucoma   . Low back pain   . MI (myocardial infarction) ([place])    Anterior 2006  . Mixed hyperlipidemia   . Renal insufficiency   . Type 2 diabetes mellitus ([place])     Past Surgical History:  Procedure Laterality Date  . CORONARY ANGIOPLASTY WITH STENT PLACEMENT  2006    Current Outpatient Medications  Medication Sig Dispense Refill  . ACCU-CHEK AVIVA PLUS test strip USE TO CHECK BLOOD SUGAR ONCE DAILY 100 each 3  . aspirin 81 MG tablet Take 81 mg by mouth daily.      [name] atorvastatin  (LIPITOR) 40 MG tablet Take 1 tablet (40 mg total) by mouth daily. 90 tablet 1  . blood glucose meter kit and supplies Dispense based on patient and insurance preference. Use up to four times daily as directed. (FOR ICD-10 E10.9, E11.9). 1 each 0  . brimonidine (ALPHAGAN) 0.15 % ophthalmic solution Place 1 drop into both eyes 2 (two) times daily.    . Cholecalciferol (VITAMIN D3) 2000 UNITS TABS Take 1 tablet by mouth daily.    . fish oil-omega-3 fatty acids 1000 MG capsule Take 1 g by mouth 2 (two) times daily.      [name] gemfibrozil (LOPID) 600 MG tablet Take 1 tablet (600 mg total) by mouth 2 (two) times daily before a meal. 180 tablet 1  . glipiZIDE (GLUCOTROL) 10 MG tablet Take 1 tablet (10 mg total) by mouth 2 (two) times daily before a meal. 180 tablet 0  . isosorbide mononitrate (IMDUR) 30 MG 24 hr tablet Take 1 tablet (30 mg total) by mouth daily. New 01/30/2018 90 tablet 1  . lisinopril (ZESTRIL) 10 MG tablet Take 1 tablet (10 mg total) by mouth daily. 90 tablet 3  . metFORMIN (GLUCOPHAGE) 1000 MG tablet 1/2 tablet in AM and whole tablet in evening 180 tablet 1  . metoprolol tartrate (LOPRESSOR) 25 MG tablet Take 1 tablet (25 mg total) by mouth 2 (two) times daily. (Please make 6 mos July appt) 180  tablet 0  . nitroGLYCERIN (NITROSTAT) 0.4 MG SL tablet Place 1 tablet (0.4 mg total) under the tongue every 5 (five) minutes x 3 doses as needed for chest pain (if no relief after 3rd dose, proceed to the ED for an evaluation). 25 tablet 1  . traZODone (DESYREL) 100 MG tablet Take 1 tablet (100 mg total) by mouth at bedtime. 90 tablet 1   No current facility-administered medications for this visit.    Allergies:  Patient has no known allergies.   Social History: The patient  reports that he quit smoking about 31 years ago. His smoking use included cigarettes. He has a 40.00 pack-year smoking history. He has never used smokeless tobacco. He reports that he does not drink alcohol or use drugs.    ROS:  Please see the history of present illness. Otherwise, complete review of systems is positive for none.  All other systems are reviewed and negative.   Physical Exam: VS:  BP 124/72   Pulse 68   Temp 98.8 F (37.1 C)   Ht _0  (1.778 m)   Wt 160 lb (72.6 kg)   SpO2 98%   BMI 22.96 kg/m , BMI Body mass index is 22.96 kg/m.  Wt Readings from Last 3 Encounters:  12/31/18 160 lb (72.6 kg)  12/16/18 161 lb (73 kg)  06/05/18 161 lb 12.8 oz (73.4 kg)    General: Patient appears comfortable at rest. HEENT: Conjunctiva and lids normal, wearing a mask. Neck: Supple, no elevated JVP or carotid bruits, no thyromegaly. Lungs: Clear to auscultation, nonlabored breathing at rest. Cardiac: Regular rate and rhythm, no S3 or significant systolic murmur, no pericardial rub. Abdomen: Soft, nontender, bowel sounds present. Extremities: No pitting edema, distal pulses 2+. Skin: Warm and dry. Musculoskeletal: No kyphosis. Neuropsychiatric: Alert and oriented x3, affect grossly appropriate.  ECG:  An ECG dated 01/30/2018 was personally reviewed today and demonstrated:  Sinus bradycardia with low voltage and poor R wave progression.  Recent Labwork: 12/16/2018: ALT 24; AST 39; BUN 13; Creatinine, Ser 1.26; Potassium 4.4; Sodium 138     Component Value Date/Time   CHOL 120 12/16/2018 0921   CHOL 177 09/25/2012 1015   TRIG 113 12/16/2018 0921   TRIG 206 (H) 10/07/2014 0927   TRIG 269 (H) 09/25/2012 1015   HDL 30 (L) 12/16/2018 0921   HDL 22 (L) 10/07/2014 0927   HDL 28 (L) 09/25/2012 1015   CHOLHDL 4.0 12/16/2018 0921   LDLCALC 67 12/16/2018 0921   LDLCALC 98 12/21/2013 0850   LDLCALC 95 09/25/2012 1015    Other Studies Reviewed Today:  [place] [facility] Myoview 02/04/2018:  There was no ST segment deviation noted during stress.  Defect 1: There is a medium defect of severe severity present in the mid anteroseptal, apical anterior, apical septal and apex location. This is consistent with  myocardial scar.  Defect 2: There is a medium defect of moderate severity present in the mid inferolateral, apical inferior and apical lateral location. This is consistent with scar with a moderate degree of peri-infarct ischemia.  Findings consistent with prior myocardial infarction with moderate peri-infarct ischemia in the inferolateral wall.  This is an intermediate risk study.  Nuclear stress EF: 53%.  Assessment and Plan:  1.  CAD status post BMS to the LAD in 2006 with 75% distal RCA stenosis at that time.  He reports stable angina on current regimen and is comfortable with observation.  We have discussed possible cardiac catheterization if symptoms escalate.  ECG  reviewed and stable today.  2.  Mixed hyperlipidemia on Lipitor.  Recent LDL 67.  3.  CKD stage III, recent creatinine 1.26.  4.  Essential hypertension, systolic is in the 127K today.  He continues on Zestril and Lopressor.  Medication Adjustments/Labs and Tests Ordered: Current medicines are reviewed at length with the patient today.  Concerns regarding medicines are outlined above.   Tests Ordered: Orders Placed This Encounter  Procedures  . EKG 12-Lead    Medication Changes: No orders of the defined types were placed in this encounter.   Disposition:  Follow up 6 months in the [place] office.  Signed, Satira Sark, MD, [facility] 12/31/2018 11:05 AM    [facility] [place], [place], [place] [address] Phone: [phone]; Fax: [phone]

## 2018-12-31 NOTE — Patient Instructions (Addendum)

## 2019-01-20 DIAGNOSIS — Z23 Encounter for immunization: Secondary | ICD-10-CM | POA: Diagnosis not present

## 2019-03-19 ENCOUNTER — Other Ambulatory Visit: Payer: Self-pay | Admitting: *Deleted

## 2019-03-19 DIAGNOSIS — E1122 Type 2 diabetes mellitus with diabetic chronic kidney disease: Secondary | ICD-10-CM

## 2019-03-19 DIAGNOSIS — N183 Chronic kidney disease, stage 3 unspecified: Secondary | ICD-10-CM

## 2019-03-20 ENCOUNTER — Encounter: Payer: Self-pay | Admitting: Nurse Practitioner

## 2019-03-20 ENCOUNTER — Other Ambulatory Visit: Payer: Self-pay

## 2019-03-20 ENCOUNTER — Ambulatory Visit (INDEPENDENT_AMBULATORY_CARE_PROVIDER_SITE_OTHER): Payer: Medicare Other | Admitting: Nurse Practitioner

## 2019-03-20 VITALS — BP 134/69 | HR 75 | Temp 97.3°F | Ht 70.0 in | Wt 157.4 lb

## 2019-03-20 DIAGNOSIS — E1122 Type 2 diabetes mellitus with diabetic chronic kidney disease: Secondary | ICD-10-CM | POA: Diagnosis not present

## 2019-03-20 DIAGNOSIS — N183 Chronic kidney disease, stage 3 unspecified: Secondary | ICD-10-CM | POA: Diagnosis not present

## 2019-03-20 DIAGNOSIS — E782 Mixed hyperlipidemia: Secondary | ICD-10-CM | POA: Diagnosis not present

## 2019-03-20 DIAGNOSIS — N1832 Chronic kidney disease, stage 3b: Secondary | ICD-10-CM | POA: Diagnosis not present

## 2019-03-20 DIAGNOSIS — I25119 Atherosclerotic heart disease of native coronary artery with unspecified angina pectoris: Secondary | ICD-10-CM | POA: Diagnosis not present

## 2019-03-20 DIAGNOSIS — I10 Essential (primary) hypertension: Secondary | ICD-10-CM | POA: Diagnosis not present

## 2019-03-20 DIAGNOSIS — F5101 Primary insomnia: Secondary | ICD-10-CM | POA: Diagnosis not present

## 2019-03-20 LAB — BAYER DCA HB A1C WAIVED: HB A1C (BAYER DCA - WAIVED): 6.9 % (ref <7.0)

## 2019-03-20 MED ORDER — METOPROLOL TARTRATE 25 MG PO TABS: 25.0000 mg | ORAL_TABLET | Freq: Two times a day (BID) | ORAL | 0 refills | Status: DC

## 2019-03-20 MED ORDER — GLIPIZIDE 10 MG PO TABS: 10.0000 mg | ORAL_TABLET | Freq: Two times a day (BID) | ORAL | 0 refills | Status: DC

## 2019-03-20 NOTE — Progress Notes (Signed)
Subjective:    Patient ID: Edward [name], male    DOB: 1950-07-31, 68 y.o.   MRN: [id number]   Chief Complaint: Medical Management of Chronic Issues    HPI:  1. Type 2 diabetes mellitus with stage 3 chronic kidney disease, without long-term current use of insulin, unspecified whether stage 3a or 3b CKD (HCC) Fasting blood sugars are running between 70-120. He denies any low blood sugars. He doe not check his blood sugars very often. Lab Results  Component Value Date   HGBA1C 7.3 (H) 12/16/2018     2. Essential hypertension No c/o chest pain, sob or headache. Does not check blood pressure at home. BP Readings from Last 3 Encounters:  03/20/19 134/69  12/31/18 124/72  12/16/18 132/75     3. Mixed hyperlipidemia Watches diet but does not do any exercise Lab Results  Component Value Date   CHOL 120 12/16/2018   HDL 30 (L) 12/16/2018   LDLCALC 67 12/16/2018   TRIG 113 12/16/2018   CHOLHDL 4.0 12/16/2018     4. Atherosclerosis of native coronary artery of native heart with angina pectoris (HCC) Saw Dr. Domenic Polite on 12/31/18. According to office note there was no changes made to plan of care.  5. Stage 3b chronic kidney disease No problems voiding Lab Results  Component Value Date   CREATININE 1.26 12/16/2018    6. Primary insomnia Takes trazadone at night to sleep and works well. Usually sleeps about 6 hours at night.    Outpatient Encounter Medications as of 03/20/2019  Medication Sig  . ACCU-CHEK AVIVA PLUS test strip USE TO CHECK BLOOD SUGAR ONCE DAILY  . aspirin 81 MG tablet Take 81 mg by mouth daily.    [name] atorvastatin (LIPITOR) 40 MG tablet Take 1 tablet (40 mg total) by mouth daily.  . blood glucose meter kit and supplies Dispense based on patient and insurance preference. Use up to four times daily as directed. (FOR ICD-10 E10.9, E11.9).  . brimonidine (ALPHAGAN) 0.15 % ophthalmic solution Place 1 drop into both eyes 2 (two) times daily.  .  Cholecalciferol (VITAMIN D3) 2000 UNITS TABS Take 1 tablet by mouth daily.  . fish oil-omega-3 fatty acids 1000 MG capsule Take 1 g by mouth 2 (two) times daily.    [name] gemfibrozil (LOPID) 600 MG tablet Take 1 tablet (600 mg total) by mouth 2 (two) times daily before a meal.  . glipiZIDE (GLUCOTROL) 10 MG tablet Take 1 tablet (10 mg total) by mouth 2 (two) times daily before a meal.  . lisinopril (ZESTRIL) 10 MG tablet Take 1 tablet (10 mg total) by mouth daily.  . metFORMIN (GLUCOPHAGE) 1000 MG tablet 1/2 tablet in AM and whole tablet in evening  . metoprolol tartrate (LOPRESSOR) 25 MG tablet Take 1 tablet (25 mg total) by mouth 2 (two) times daily. (Please make 6 mos July appt)  . nitroGLYCERIN (NITROSTAT) 0.4 MG SL tablet Place 1 tablet (0.4 mg total) under the tongue every 5 (five) minutes x 3 doses as needed for chest pain (if no relief after 3rd dose, proceed to the ED for an evaluation).  . traZODone (DESYREL) 100 MG tablet Take 1 tablet (100 mg total) by mouth at bedtime.  . isosorbide mononitrate (IMDUR) 30 MG 24 hr tablet Take 1 tablet (30 mg total) by mouth daily. New 01/30/2018     Past Surgical History:  Procedure Laterality Date  . CORONARY ANGIOPLASTY WITH STENT PLACEMENT  2006    Family History  Problem Relation Age of Onset  . Aneurysm Father        Brain aneurysm  . Lung cancer Mother        Small cell carcinoma of lung,kidney and heart  . Heart disease Mother   . Cancer Brother        lungs  . Diabetes Sister   . Diabetes Sister   . Diabetes Brother   . Diabetes Brother     New complaints: None today  Social history: Lives by hisself - retired  Controlled substance contract: n/a    Review of Systems  Constitutional: Negative for activity change and appetite change.  HENT: Negative.   Eyes: Negative for pain.  Respiratory: Negative for shortness of breath.   Cardiovascular: Negative for chest pain, palpitations and leg swelling.  Gastrointestinal:  Negative for abdominal pain.  Endocrine: Negative for polydipsia.  Genitourinary: Negative.   Skin: Negative for rash.  Neurological: Negative for dizziness, weakness and headaches.  Hematological: Does not bruise/bleed easily.  Psychiatric/Behavioral: Negative.   All other systems reviewed and are negative.      Objective:   Physical Exam Vitals signs and nursing note reviewed.  Constitutional:      Appearance: Normal appearance. He is well-developed.  HENT:     Head: Normocephalic.     Nose: Nose normal.  Eyes:     Pupils: Pupils are equal, round, and reactive to light.  Neck:     Musculoskeletal: Normal range of motion and neck supple.     Thyroid: No thyroid mass or thyromegaly.     Vascular: No carotid bruit or JVD.     Trachea: Phonation normal.  Cardiovascular:     Rate and Rhythm: Normal rate and regular rhythm.  Pulmonary:     Effort: Pulmonary effort is normal. No respiratory distress.     Breath sounds: Normal breath sounds.  Abdominal:     General: Bowel sounds are normal.     Palpations: Abdomen is soft.     Tenderness: There is no abdominal tenderness.  Musculoskeletal: Normal range of motion.  Lymphadenopathy:     Cervical: No cervical adenopathy.  Skin:    General: Skin is warm and dry.  Neurological:     Mental Status: He is alert and oriented to person, place, and time.  Psychiatric:        Behavior: Behavior normal.        Thought Content: Thought content normal.        Judgment: Judgment normal.     BP 134/69   Pulse 75   Temp (!) 97.3 F (36.3 C) (Temporal)   Ht 5' 10"  (1.778 m)   Wt 157 lb 6.4 oz (71.4 kg)   SpO2 98%   BMI 22.58 kg/m   hgba1c 6.9%     Assessment & Plan:  Edward [name] comes in today with chief complaint of Medical Management of Chronic Issues   Diagnosis and orders addressed:  1. Type 2 diabetes mellitus with stage 3 chronic kidney disease, without long-term current use of insulin, unspecified whether stage 3a  or 3b CKD ([place]) Continue to watch carbs in diet - CMP14+EGFR - Lipid panel - hgba1c glipiZIDE (GLUCOTROL) 10 MG tablet; Take 1 tablet (10 mg total) by mouth 2 (two) times daily before a meal.  Dispense: 180 tablet; Refill: 0   2. Essential hypertension Low sodium diet - CMP14+EGFR - Lipid panel - metoprolol tartrate (LOPRESSOR) 25 MG tablet; Take 1 tablet (25 mg total) by mouth  2 (two) times daily. (Please make 6 mos July appt)  Dispense: 180 tablet; Refill: 0  3. Mixed hyperlipidemia Low fat diet  4. Atherosclerosis of native coronary artery of native heart with angina pectoris [facility]) Keep yearly follow up with cardiology  5. Stage 3b chronic kidney disease Labs pending  6. Primary insomnia Bedtime routine Continue trazadone as rx  -  Labs pending Health Maintenance reviewed Diet and exercise encouraged  Follow up plan: 3 months   Mary-Margaret Hassell Done, FNP

## 2019-03-20 NOTE — Patient Instructions (Signed)
Diabetes Mellitus and Foot Care Foot care is an important part of your health, especially when you have diabetes. Diabetes may cause you to have problems because of poor blood flow (circulation) to your feet and legs, which can cause your skin to:  Become thinner and drier.  Break more easily.  Heal more slowly.  Peel and crack. You may also have nerve damage (neuropathy) in your legs and feet, causing decreased feeling in them. This means that you may not notice minor injuries to your feet that could lead to more serious problems. Noticing and addressing any potential problems early is the best way to prevent future foot problems. How to care for your feet Foot hygiene  Wash your feet daily with warm water and mild soap. Do not use hot water. Then, pat your feet and the areas between your toes until they are completely dry. Do not soak your feet as this can dry your skin.  Trim your toenails straight across. Do not dig under them or around the cuticle. File the edges of your nails with an emery board or nail file.  Apply a moisturizing lotion or petroleum jelly to the skin on your feet and to dry, brittle toenails. Use lotion that does not contain alcohol and is unscented. Do not apply lotion between your toes. Shoes and socks  Wear clean socks or stockings every day. Make sure they are not too tight. Do not wear knee-high stockings since they may decrease blood flow to your legs.  Wear shoes that fit properly and have enough cushioning. Always look in your shoes before you put them on to be sure there are no objects inside.  To break in new shoes, wear them for just a few hours a day. This prevents injuries on your feet. Wounds, scrapes, corns, and calluses  Check your feet daily for blisters, cuts, bruises, sores, and redness. If you cannot see the bottom of your feet, use a mirror or ask someone for help.  Do not cut corns or calluses or try to remove them with medicine.  If you  find a minor scrape, cut, or break in the skin on your feet, keep it and the skin around it clean and dry. You may clean these areas with mild soap and water. Do not clean the area with peroxide, alcohol, or iodine.  If you have a wound, scrape, corn, or callus on your foot, look at it several times a day to make sure it is healing and not infected. Check for: ? Redness, swelling, or pain. ? Fluid or blood. ? Warmth. ? Pus or a bad smell. General instructions  Do not cross your legs. This may decrease blood flow to your feet.  Do not use heating pads or hot water bottles on your feet. They may burn your skin. If you have lost feeling in your feet or legs, you may not know this is happening until it is too late.  Protect your feet from hot and cold by wearing shoes, such as at the beach or on hot pavement.  Schedule a complete foot exam at least once a year (annually) or more often if you have foot problems. If you have foot problems, report any cuts, sores, or bruises to your health care provider immediately. Contact a health care provider if:  You have a medical condition that increases your risk of infection and you have any cuts, sores, or bruises on your feet.  You have an injury that is not   healing.  You have redness on your legs or feet.  You feel burning or tingling in your legs or feet.  You have pain or cramps in your legs and feet.  Your legs or feet are numb.  Your feet always feel cold.  You have pain around a toenail. Get help right away if:  You have a wound, scrape, corn, or callus on your foot and: ? You have pain, swelling, or redness that gets worse. ? You have fluid or blood coming from the wound, scrape, corn, or callus. ? Your wound, scrape, corn, or callus feels warm to the touch. ? You have pus or a bad smell coming from the wound, scrape, corn, or callus. ? You have a fever. ? You have a red line going up your leg. Summary  Check your feet every day  for cuts, sores, red spots, swelling, and blisters.  Moisturize feet and legs daily.  Wear shoes that fit properly and have enough cushioning.  If you have foot problems, report any cuts, sores, or bruises to your health care provider immediately.  Schedule a complete foot exam at least once a year (annually) or more often if you have foot problems. This information is not intended to replace advice given to you by your health care provider. Make sure you discuss any questions you have with your health care provider. Document Released: 04/20/2000 Document Revised: 06/05/2017 Document Reviewed: 05/25/2016 Elsevier Patient Education  2020 Elsevier Inc.  

## 2019-03-21 LAB — CMP14+EGFR
ALT: 24 IU/L (ref 0–44)
AST: 38 IU/L (ref 0–40)
Albumin/Globulin Ratio: 1.8 (ref 1.2–2.2)
Albumin: 4.4 g/dL (ref 3.8–4.8)
Alkaline Phosphatase: 53 IU/L (ref 39–117)
BUN/Creatinine Ratio: 12 (ref 10–24)
BUN: 20 mg/dL (ref 8–27)
Bilirubin Total: 0.4 mg/dL (ref 0.0–1.2)
CO2: 17 mmol/L — ABNORMAL LOW (ref 20–29)
Calcium: 9.7 mg/dL (ref 8.6–10.2)
Chloride: 103 mmol/L (ref 96–106)
Creatinine, Ser: 1.62 mg/dL — ABNORMAL HIGH (ref 0.76–1.27)
GFR calc Af Amer: 50 mL/min/{1.73_m2} — ABNORMAL LOW (ref >59)
GFR calc non Af Amer: 44 mL/min/{1.73_m2} — ABNORMAL LOW (ref >59)
Globulin, Total: 2.4 g/dL (ref 1.5–4.5)
Glucose: 85 mg/dL (ref 65–99)
Potassium: 5.8 mmol/L — ABNORMAL HIGH (ref 3.5–5.2)
Sodium: 139 mmol/L (ref 134–144)
Total Protein: 6.8 g/dL (ref 6.0–8.5)

## 2019-03-21 LAB — LIPID PANEL
Chol/HDL Ratio: 3.8 ratio (ref 0.0–5.0)
Cholesterol, Total: 105 mg/dL (ref 100–199)
HDL: 28 mg/dL — ABNORMAL LOW (ref >39)
LDL Chol Calc (NIH): 56 mg/dL (ref 0–99)
Triglycerides: 115 mg/dL (ref 0–149)
VLDL Cholesterol Cal: 21 mg/dL (ref 5–40)

## 2019-04-15 ENCOUNTER — Other Ambulatory Visit: Payer: Self-pay | Admitting: Nurse Practitioner

## 2019-04-15 DIAGNOSIS — N183 Chronic kidney disease, stage 3 unspecified: Secondary | ICD-10-CM

## 2019-06-04 ENCOUNTER — Ambulatory Visit: Payer: Medicare Other

## 2019-06-16 ENCOUNTER — Other Ambulatory Visit: Payer: Self-pay | Admitting: Nurse Practitioner

## 2019-06-16 DIAGNOSIS — E782 Mixed hyperlipidemia: Secondary | ICD-10-CM

## 2019-06-16 DIAGNOSIS — F5101 Primary insomnia: Secondary | ICD-10-CM

## 2019-06-17 ENCOUNTER — Other Ambulatory Visit: Payer: Self-pay | Admitting: Nurse Practitioner

## 2019-06-17 DIAGNOSIS — E1122 Type 2 diabetes mellitus with diabetic chronic kidney disease: Secondary | ICD-10-CM

## 2019-06-17 DIAGNOSIS — I10 Essential (primary) hypertension: Secondary | ICD-10-CM

## 2019-06-17 DIAGNOSIS — E782 Mixed hyperlipidemia: Secondary | ICD-10-CM

## 2019-06-17 DIAGNOSIS — N183 Chronic kidney disease, stage 3 unspecified: Secondary | ICD-10-CM

## 2019-06-18 MED ORDER — GEMFIBROZIL 600 MG PO TABS: ORAL_TABLET | ORAL | 0 refills | Status: DC

## 2019-06-18 MED ORDER — GLIPIZIDE 10 MG PO TABS: ORAL_TABLET | ORAL | 0 refills | Status: DC

## 2019-06-18 NOTE — Telephone Encounter (Signed)
Refill failed. resent °

## 2019-06-18 NOTE — Addendum Note (Signed)
Addended by: Antonietta Barcelona D on: 06/18/2019 02:28 PM   Modules accepted: Orders

## 2019-06-26 ENCOUNTER — Other Ambulatory Visit: Payer: Self-pay

## 2019-06-26 ENCOUNTER — Encounter: Payer: Self-pay | Admitting: Nurse Practitioner

## 2019-06-26 ENCOUNTER — Ambulatory Visit (INDEPENDENT_AMBULATORY_CARE_PROVIDER_SITE_OTHER): Payer: Medicare Other | Admitting: Nurse Practitioner

## 2019-06-26 DIAGNOSIS — I10 Essential (primary) hypertension: Secondary | ICD-10-CM | POA: Diagnosis not present

## 2019-06-26 DIAGNOSIS — E1122 Type 2 diabetes mellitus with diabetic chronic kidney disease: Secondary | ICD-10-CM

## 2019-06-26 DIAGNOSIS — E782 Mixed hyperlipidemia: Secondary | ICD-10-CM

## 2019-06-26 DIAGNOSIS — F5101 Primary insomnia: Secondary | ICD-10-CM

## 2019-06-26 DIAGNOSIS — I25119 Atherosclerotic heart disease of native coronary artery with unspecified angina pectoris: Secondary | ICD-10-CM

## 2019-06-26 DIAGNOSIS — E1121 Type 2 diabetes mellitus with diabetic nephropathy: Secondary | ICD-10-CM

## 2019-06-26 DIAGNOSIS — N1832 Chronic kidney disease, stage 3b: Secondary | ICD-10-CM | POA: Diagnosis not present

## 2019-06-26 MED ORDER — TRAZODONE HCL 100 MG PO TABS: 100.0000 mg | ORAL_TABLET | Freq: Every day | ORAL | 1 refills | Status: DC

## 2019-06-26 MED ORDER — GLIPIZIDE 10 MG PO TABS: ORAL_TABLET | ORAL | 1 refills | Status: DC

## 2019-06-26 MED ORDER — ATORVASTATIN CALCIUM 40 MG PO TABS: 40.0000 mg | ORAL_TABLET | Freq: Every day | ORAL | 1 refills | Status: DC

## 2019-06-26 MED ORDER — METOPROLOL TARTRATE 25 MG PO TABS: 25.0000 mg | ORAL_TABLET | Freq: Two times a day (BID) | ORAL | 1 refills | Status: DC

## 2019-06-26 MED ORDER — LISINOPRIL 10 MG PO TABS: 10.0000 mg | ORAL_TABLET | Freq: Every day | ORAL | 1 refills | Status: DC

## 2019-06-26 MED ORDER — GEMFIBROZIL 600 MG PO TABS: ORAL_TABLET | ORAL | 1 refills | Status: DC

## 2019-06-26 MED ORDER — ISOSORBIDE MONONITRATE ER 30 MG PO TB24: 30.0000 mg | ORAL_TABLET | Freq: Every day | ORAL | 1 refills | Status: DC

## 2019-06-26 MED ORDER — METFORMIN HCL 1000 MG PO TABS: ORAL_TABLET | ORAL | 1 refills | Status: DC

## 2019-06-26 NOTE — Progress Notes (Signed)
Virtual Visit via telephone Note Due to COVID-19 pandemic this visit was conducted virtually. This visit type was conducted due to national recommendations for restrictions regarding the COVID-19 Pandemic (e.g. social distancing, sheltering in place) in an effort to limit this patient's exposure and mitigate transmission in our community. All issues noted in this document were discussed and addressed.  A physical exam was not performed with this format.  I connected with Edward [name] on 06/26/19 at 8:00 by telephone and verified that I am speaking with the correct person using two identifiers. Edward [name] is currently located at home and no ne is currently with him during visit. The provider, Edward Hassell Done, FNP is located in their office at time of visit.  I discussed the limitations, risks, security and privacy concerns of performing an evaluation and management service by telephone and the availability of in person appointments. I also discussed with the patient that there may be a patient responsible charge related to this service. The patient expressed understanding and agreed to proceed.   History and Present Illness:   Chief Complaint: Medical Management of Chronic Issues    HPI:  1. Essential hypertension He reports occasional  chest pain, but no sob or headache. Does not check blood pressure at home. BP Readings from Last 3 Encounters:  03/20/19 134/69  12/31/18 124/72  12/16/18 132/75     2. Mixed hyperlipidemia Does not watch diet very closely and does no dedicated exercise. Lab Results  Component Value Date   CHOL 105 03/20/2019   HDL 28 (L) 03/20/2019   LDLCALC 56 03/20/2019   TRIG 115 03/20/2019   CHOLHDL 3.8 03/20/2019     3. Atherosclerosis of native coronary artery of native heart with angina pectoris [facility]) Sees cardiology yearly is currently on imdur. Last cardiology appointment was 12/31/18. According to office note, EKG stable. Patient reported  stable angina and does not want to pursue a cardiac catherization at this time.  4. Type 2 diabetes mellitus with stage 3b chronic kidney disease, without long-term current use of insulin (HCC) Fasting blood suagrs usually run around 80-120. Sometimes it os on the 60. He watches sweets in diet. Lab Results  Component Value Date   HGBA1C 6.9 03/20/2019     5. Stage 3b chronic kidney disease denies any problems voiding. Lab Results  Component Value Date   CREATININE 1.62 (H) 03/20/2019     6. Primary insomnia He does take trazodone to sleep at night and says he sleeps well most nights.    Outpatient Encounter Medications as of 06/26/2019  Medication Sig  . ACCU-CHEK AVIVA PLUS test strip USE TO CHECK BLOOD SUGAR ONCE DAILY  . aspirin 81 MG tablet Take 81 mg by mouth daily.    [name] atorvastatin (LIPITOR) 40 MG tablet Take 1 tablet by mouth once daily  . blood glucose meter kit and supplies Dispense based on patient and insurance preference. Use up to four times daily as directed. (FOR ICD-10 E10.9, E11.9).  . brimonidine (ALPHAGAN) 0.15 % ophthalmic solution Place 1 drop into both eyes 2 (two) times daily.  . Cholecalciferol (VITAMIN D3) 2000 UNITS TABS Take 1 tablet by mouth daily.  . fish oil-omega-3 fatty acids 1000 MG capsule Take 1 g by mouth 2 (two) times daily.    [name] gemfibrozil (LOPID) 600 MG tablet TAKE 1 TABLET BY MOUTH TWICE DAILY BEFORE MEAL(S)  . glipiZIDE (GLUCOTROL) 10 MG tablet TAKE 1 TABLET BY MOUTH TWICE DAILY BEFORE MEAL(S)  .  isosorbide mononitrate (IMDUR) 30 MG 24 hr tablet Take 1 tablet (30 mg total) by mouth daily. New 01/30/2018  . lisinopril (ZESTRIL) 10 MG tablet Take 1 tablet (10 mg total) by mouth daily.  . metFORMIN (GLUCOPHAGE) 1000 MG tablet TAKE 1/2 (ONE-HALF) TABLET BY MOUTH IN THE MORNING AND 1 IN THE EVENING  . metoprolol tartrate (LOPRESSOR) 25 MG tablet Take 1 tablet by mouth twice daily  . nitroGLYCERIN (NITROSTAT) 0.4 MG SL tablet Place 1 tablet  (0.4 mg total) under the tongue every 5 (five) minutes x 3 doses as needed for chest pain (if no relief after 3rd dose, proceed to the ED for an evaluation).  . traZODone (DESYREL) 100 MG tablet TAKE 1 TABLET BY MOUTH AT BEDTIME     Past Surgical History:  Procedure Laterality Date  . CORONARY ANGIOPLASTY WITH STENT PLACEMENT  2006    Family History  Problem Relation Age of Onset  . Aneurysm Father        Brain aneurysm  . Lung cancer Mother        Small cell carcinoma of lung,kidney and heart  . Heart disease Mother   . Cancer Brother        lungs  . Diabetes Sister   . Diabetes Sister   . Diabetes Brother   . Diabetes Brother     New complaints: None today  Social history: Lives by hisself. He has family and friends that check on him daily.  Controlled substance contract: n/a     Review of Systems  Constitutional: Negative for diaphoresis and weight loss.  Eyes: Negative for blurred vision, double vision and pain.  Respiratory: Negative for shortness of breath.   Cardiovascular: Negative for chest pain, palpitations, orthopnea and leg swelling.  Gastrointestinal: Negative for abdominal pain.  Skin: Negative for rash.  Neurological: Negative for dizziness, sensory change, loss of consciousness, weakness and headaches.  Endo/Heme/Allergies: Negative for polydipsia. Does not bruise/bleed easily.  Psychiatric/Behavioral: Negative for memory loss. The patient does not have insomnia.   All other systems reviewed and are negative.    Observations/Objective: Alert and oriented- answers all questions appropriately No distress    Assessment and Plan: Edward [name] comes in today with chief complaint of Medical Management of Chronic Issues   Diagnosis and orders addressed:  1. Essential hypertension Low sodium diet - metoprolol tartrate (LOPRESSOR) 25 MG tablet; Take 1 tablet (25 mg total) by mouth 2 (two) times daily.  Dispense: 180 tablet; Refill: 1  2.  Mixed hyperlipidemia Low fat diet - gemfibrozil (LOPID) 600 MG tablet; TAKE 1 TABLET BY MOUTH TWICE DAILY BEFORE MEAL(S)  Dispense: 180 tablet; Refill: 1 - atorvastatin (LIPITOR) 40 MG tablet; Take 1 tablet (40 mg total) by mouth daily.  Dispense: 90 tablet; Refill: 1  3. Atherosclerosis of native coronary artery of native heart with angina pectoris ([place]) Keep follow up appointment with cardiology - isosorbide mononitrate (IMDUR) 30 MG 24 hr tablet; Take 1 tablet (30 mg total) by mouth daily. New 01/30/2018  Dispense: 90 tablet; Refill: 1  4. Type 2 diabetes mellitus with stage 3b chronic kidney disease, without long-term current use of insulin (HCC) Continue to watch carbs in diet - metFORMIN (GLUCOPHAGE) 1000 MG tablet; TAKE 1/2 (ONE-HALF) TABLET BY MOUTH IN THE MORNING AND 1 IN THE EVENING  Dispense: 180 tablet; Refill: 1 - lisinopril (ZESTRIL) 10 MG tablet; Take 1 tablet (10 mg total) by mouth daily.  Dispense: 90 tablet; Refill: 1  glipiZIDE (GLUCOTROL) 10 MG  tablet; TAKE 1 TABLET BY MOUTH TWICE DAILY BEFORE MEAL(S)  Dispense: 180 tablet; Refill: 1   5. Stage 3b chronic kidney disease Will continue to monitor with labs  6. Primary insomnia Bedtime routine - traZODone (DESYREL) 100 MG tablet; Take 1 tablet (100 mg total) by mouth at bedtime.  Dispense: 90 tablet; Refill: 1   Labs pending Health Maintenance reviewed Diet and exercise encouraged  Follow up plan: 3 months     I discussed the assessment and treatment plan with the patient. The patient was provided an opportunity to ask questions and all were answered. The patient agreed with the plan and demonstrated an understanding of the instructions.   The patient was advised to call back or seek an in-person evaluation if the symptoms worsen or if the condition fails to improve as anticipated.  The above assessment and management plan was discussed with the patient. The patient verbalized understanding of and has agreed to  the management plan. Patient is aware to call the clinic if symptoms persist or worsen. Patient is aware when to return to the clinic for a follow-up visit. Patient educated on when it is appropriate to go to the emergency department.   Time call ended:  8:17  I provided 17 minutes of non-face-to-face time during this encounter.    Edward Hassell Done, FNP

## 2019-07-01 ENCOUNTER — Ambulatory Visit (INDEPENDENT_AMBULATORY_CARE_PROVIDER_SITE_OTHER): Payer: Medicare Other | Admitting: *Deleted

## 2019-07-01 DIAGNOSIS — Z0000 Encounter for general adult medical examination without abnormal findings: Secondary | ICD-10-CM | POA: Diagnosis not present

## 2019-07-01 NOTE — Progress Notes (Signed)
   MEDICARE ANNUAL WELLNESS VISIT  07/01/2019  Telephone Visit Disclaimer This Medicare AWV was conducted by telephone due to national recommendations for restrictions regarding the COVID-19 Pandemic (e.g. social distancing).  I verified, using two identifiers, that I am speaking with Edward [name] or their authorized healthcare agent. I discussed the limitations, risks, security, and privacy concerns of performing an evaluation and management service by telephone and the potential availability of an in-person appointment in the future. The patient expressed understanding and agreed to proceed.   Subjective:  Edward [name] is a 68 y.o. male patient of Martin, Mary-Margaret, FNP who had a Medicare Annual Wellness Visit today via telephone. Edward [name] is Retired and lives alone. He has 2 children. He reports that he is socially active and does interact with friends/family regularly. He is minimally physically active and enjoys watching tv, and working in the garden.  Patient Care Team: Martin, Mary-Margaret, FNP as PCP - General (Nurse Practitioner) McDowell, Samuel G, MD as PCP - Cardiology (Cardiology) Shah, Christopher Tulip, MD as Consulting Physician (Ophthalmology)   Advanced Directives 07/01/2019  Does Patient Have a Medical Advance Directive? No  Would patient like information on creating a medical advance directive? No - Patient declined    Hospital Utilization Over the Past 12 Months: # of hospitalizations or ER visits: 0 # of surgeries: 0  Review of Systems    Patient reports that his overall health is unchanged compared to last year.  History obtained from chart review and the patient  Patient Reported Readings (BP, Pulse, CBG, Weight, etc) none  Pain Assessment Pain : No/denies pain     Current Medications & Allergies (verified) Allergies as of 07/01/2019   No Known Allergies     Medication List       Accurate as of July 01, 2019 10:38 AM. If you have any  questions, ask your nurse or doctor.        STOP taking these medications   blood glucose meter kit and supplies     TAKE these medications   Accu-Chek Aviva Plus test strip Generic drug: glucose blood USE TO CHECK BLOOD SUGAR ONCE DAILY   aspirin 81 MG tablet Take 81 mg by mouth daily.   atorvastatin 40 MG tablet Commonly known as: LIPITOR Take 1 tablet (40 mg total) by mouth daily.   brimonidine 0.15 % ophthalmic solution Commonly known as: ALPHAGAN Place 1 drop into both eyes 2 (two) times daily.   fish oil-omega-3 fatty acids 1000 MG capsule Take 1 g by mouth 2 (two) times daily.   gemfibrozil 600 MG tablet Commonly known as: LOPID TAKE 1 TABLET BY MOUTH TWICE DAILY BEFORE MEAL(S)   glipiZIDE 10 MG tablet Commonly known as: GLUCOTROL TAKE 1 TABLET BY MOUTH TWICE DAILY BEFORE MEAL(S)   isosorbide mononitrate 30 MG 24 hr tablet Commonly known as: IMDUR Take 1 tablet (30 mg total) by mouth daily. New 01/30/2018   lisinopril 10 MG tablet Commonly known as: ZESTRIL Take 1 tablet (10 mg total) by mouth daily.   metFORMIN 1000 MG tablet Commonly known as: GLUCOPHAGE TAKE 1/2 (ONE-HALF) TABLET BY MOUTH IN THE MORNING AND 1 IN THE EVENING   metoprolol tartrate 25 MG tablet Commonly known as: LOPRESSOR Take 1 tablet (25 mg total) by mouth 2 (two) times daily.   nitroGLYCERIN 0.4 MG SL tablet Commonly known as: NITROSTAT Place 1 tablet (0.4 mg total) under the tongue every 5 (five) minutes x 3 doses as needed for chest   pain (if no relief after 3rd dose, proceed to the ED for an evaluation).   traZODone 100 MG tablet Commonly known as: DESYREL Take 1 tablet (100 mg total) by mouth at bedtime.   Vitamin D3 50 MCG (2000 UT) Tabs Take 1 tablet by mouth daily.       History (reviewed): Past Medical History:  Diagnosis Date  . Cataract   . CKD (chronic kidney disease) stage 3, GFR 30-59 ml/min   . Coronary atherosclerosis of native coronary artery    BMS LAD  2006; residual 75% distal RCA; EF 50%  . Essential hypertension, benign   . Glaucoma   . Low back pain   . MI (myocardial infarction) (HCC)    Anterior 2006  . Mixed hyperlipidemia   . Renal insufficiency   . Type 2 diabetes mellitus (HCC)    Past Surgical History:  Procedure Laterality Date  . CORONARY ANGIOPLASTY WITH STENT PLACEMENT  2006   Family History  Problem Relation Age of Onset  . Aneurysm Father        Brain aneurysm  . Lung cancer Mother        Small cell carcinoma of lung,kidney and heart  . Heart disease Mother   . Cancer Brother        lungs  . Diabetes Sister   . Diabetes Sister   . Diabetes Brother   . Diabetes Brother    Social History   Socioeconomic History  . Marital status: Married    Spouse name: Not on file  . Number of children: Not on file  . Years of education: 7th  . Highest education level: 7th grade  Occupational History  . Occupation: Retired  Tobacco Use  . Smoking status: Former Smoker    Packs/day: 2.00    Years: 20.00    Pack years: 40.00    Types: Cigarettes    Quit date: 05/08/1987    Years since quitting: 32.1  . Smokeless tobacco: Never Used  Substance and Sexual Activity  . Alcohol use: No    Alcohol/week: 0.0 standard drinks  . Drug use: No  . Sexual activity: Not on file  Other Topics Concern  . Not on file  Social History Narrative  . Not on file   Social Determinants of Health   Financial Resource Strain:   . Difficulty of Paying Living Expenses: Not on file  Food Insecurity:   . Worried About Running Out of Food in the Last Year: Not on file  . Ran Out of Food in the Last Year: Not on file  Transportation Needs:   . Lack of Transportation (Medical): Not on file  . Lack of Transportation (Non-Medical): Not on file  Physical Activity:   . Days of Exercise per Week: Not on file  . Minutes of Exercise per Session: Not on file  Stress:   . Feeling of Stress : Not on file  Social Connections:   . Frequency  of Communication with Friends and Family: Not on file  . Frequency of Social Gatherings with Friends and Family: Not on file  . Attends Religious Services: Not on file  . Active Member of Clubs or Organizations: Not on file  . Attends Club or Organization Meetings: Not on file  . Marital Status: Not on file    Activities of Daily Living In your present state of health, do you have any difficulty performing the following activities: 07/01/2019  Hearing? N  Vision? N  Comment wears glasses    Difficulty concentrating or making decisions? N  Walking or climbing stairs? N  Dressing or bathing? N  Doing errands, shopping? N  Preparing Food and eating ? N  Using the Toilet? N  In the past six months, have you accidently leaked urine? N  Do you have problems with loss of bowel control? N  Managing your Medications? N  Managing your Finances? N  Housekeeping or managing your Housekeeping? N  Some recent data might be hidden    Patient Education/ Literacy How often do you need to have someone help you when you read instructions, pamphlets, or other written materials from your doctor or pharmacy?: 2 - Rarely What is the last grade level you completed in school?: 7th  Exercise Current Exercise Habits: The patient does not participate in regular exercise at present;Home exercise routine, Type of exercise: walking, Time (Minutes): 15, Frequency (Times/Week): 7, Weekly Exercise (Minutes/Week): 105, Intensity: Mild  Diet Patient reports consuming 3 meals a day and 2 snack(s) a day Patient reports that his primary diet is: Regular Patient reports that she does have regular access to food.   Depression Screen PHQ 2/9 Scores 07/01/2019 03/20/2019 12/16/2018 06/02/2018 04/15/2018 07/26/2017 04/25/2017  PHQ - 2 Score 0 0 0 0 0 0 0  PHQ- 9 Score - - - - - - -     Fall Risk Fall Risk  07/01/2019 03/20/2019 12/16/2018 06/02/2018 04/15/2018  Falls in the past year? 0 0 0 0 0     Objective:  Edward O  [name] seemed alert and oriented and he participated appropriately during our telephone visit.  Blood Pressure Weight BMI  BP Readings from Last 3 Encounters:  03/20/19 134/69  12/31/18 124/72  12/16/18 132/75   Wt Readings from Last 3 Encounters:  03/20/19 157 lb 6.4 oz (71.4 kg)  12/31/18 160 lb (72.6 kg)  12/16/18 161 lb (73 kg)   BMI Readings from Last 1 Encounters:  03/20/19 22.58 kg/m    *Unable to obtain current vital signs, weight, and BMI due to telephone visit type  Hearing/Vision  . Edward [name] did not seem to have difficulty with hearing/understanding during the telephone conversation . Reports that he has not had a formal eye exam by an eye care professional within the past year . Reports that he has not had a formal hearing evaluation within the past year *Unable to fully assess hearing and vision during telephone visit type  Cognitive Function: 6CIT Screen 07/01/2019  What Year? 0 points  What month? 0 points  What time? 0 points  Count back from 20 0 points  Months in reverse 4 points  Repeat phrase 10 points  Total Score 14   (Normal:0-7, Significant for Dysfunction: >8)  Normal Cognitive Function Screening: No: Patient scored a 14.    Immunization & Health Maintenance Record Immunization History  Administered Date(s) Administered  . Influenza, High Dose Seasonal PF 02/03/2018, 01/20/2019  . Influenza,inj,Quad PF,6+ Mos 03/08/2014, 04/18/2015, 03/05/2016, 01/28/2017  . Influenza-Unspecified 01/25/2019  . Pneumococcal Conjugate-13 08/05/2015  . Pneumococcal Polysaccharide-23 10/19/2017  . Td 05/07/2004  . Tdap 01/13/2015    Health Maintenance  Topic Date Due  . OPHTHALMOLOGY EXAM  08/29/2018  . HEMOGLOBIN A1C  09/17/2019  . FOOT EXAM  03/19/2020  . COLONOSCOPY  08/17/2021  . TETANUS/TDAP  01/12/2025  . INFLUENZA VACCINE  Completed  . Hepatitis C Screening  Completed  . PNA vac Low Risk Adult  Completed       Assessment  This is a routine    wellness examination for Edward [name].  Health Maintenance: Due or Overdue Health Maintenance Due  Topic Date Due  . OPHTHALMOLOGY EXAM  08/29/2018    Edward [name] does not need a referral for Community Assistance: Care Management:   no Social Work:    no Prescription Assistance:  no Nutrition/Diabetes Education:  no   Plan:  Personalized Goals Goals Addressed            This Visit's Progress   . AWV       07/01/2019 AWV Goal: Diabetes Management  . Patient will maintain an A1C level below 8.0 . Patient will not develop any diabetic foot complications . Patient will not experience any hypoglycemic episodes over the next 3 months . Patient will notify our office of any CBG readings outside of the provider recommended range by calling [phone] . Patient will adhere to provider recommendations for diabetes management  Patient Self Management Activities . take all medications as prescribed and report any negative side effects . monitor and record blood sugar readings as directed . adhere to a low carbohydrate diet that incorporates lean proteins, vegetables, whole grains, low glycemic fruits . check feet daily noting any sores, cracks, injuries, or callous formations . see PCP or podiatrist if he notices any changes in his legs, feet, or toenails . Patient will visit PCP and have an A1C level checked every 3 to 6 months as directed  . have a yearly eye exam to monitor for vascular changes associated with diabetes and will request that the report be sent to his pcp.  . consult with his PCP regarding any changes in his health or new or worsening symptoms   07/01/2019 AWV Goal: Exercise for General Health   Patient will verbalize understanding of the benefits of increased physical activity:  Exercising regularly is important. It will improve your overall fitness, flexibility, and endurance.  Regular exercise also will improve your overall health. It can help you  control your weight, reduce stress, and improve your bone density.  Over the next year, patient will increase physical activity as tolerated with a goal of at least 150 minutes of moderate physical activity per week.   You can tell that you are exercising at a moderate intensity if your heart starts beating faster and you start breathing faster but can still hold a conversation.  Moderate-intensity exercise ideas include:  Walking 1 mile (1.6 km) in about 15 minutes  Biking  Hiking  Golfing  Dancing  Water aerobics  Patient will verbalize understanding of everyday activities that increase physical activity by providing examples like the following: ? Yard work, such as: ? Pushing a Conservation officer, nature ? Raking and bagging leaves ? Washing your car ? Pushing a stroller ? Shoveling snow ? Gardening ? Washing windows or floors  Patient will be able to explain general safety guidelines for exercising:   Before you start a new exercise program, talk with your health care provider.  Do not exercise so much that you hurt yourself, feel dizzy, or get very short of breath.  Wear comfortable clothes and wear shoes with good support.  Drink plenty of water while you exercise to prevent dehydration or heat stroke.  Work out until your breathing and your heartbeat get faster.       Personalized Health Maintenance & Screening Recommendations  Diabetic eye exam  Lung Cancer Screening Recommended: no (Low Dose CT Chest recommended if Age 68-80 years, 30 pack-year currently smoking OR have quit w/in  past 15 years) Hepatitis C Screening recommended: no HIV Screening recommended: no  Advanced Directives: Written information was not prepared per patient's request.  Referrals & Orders No orders of the defined types were placed in this encounter.   Follow-up Plan . Follow-up with Edward Pretty, FNP as planned . Schedule your yearly eye exam.     I have personally reviewed and  noted the following in the patient's chart:   . Medical and social history . Use of alcohol, tobacco or illicit drugs  . Current medications and supplements . Functional ability and status . Nutritional status . Physical activity . Advanced directives . List of other physicians . Hospitalizations, surgeries, and ER visits in previous 12 months . Vitals . Screenings to include cognitive, depression, and falls . Referrals and appointments  In addition, I have reviewed and discussed with Edward [name] [facility] certain preventive protocols, quality metrics, and best practice recommendations. A written personalized care plan for preventive services as well as general preventive health recommendations is available and can be mailed to the patient at his request.      Lynnea Ferrier, LPN  1/61/0960

## 2019-07-03 DIAGNOSIS — Z23 Encounter for immunization: Secondary | ICD-10-CM | POA: Diagnosis not present

## 2019-07-06 ENCOUNTER — Telehealth: Payer: Self-pay | Admitting: *Deleted

## 2019-07-06 NOTE — Telephone Encounter (Signed)
Patient verbally consented for telehealth visits with [facility] and understands that his insurance company will be billed for the encounter.   Aware to have vitals available 

## 2019-07-28 ENCOUNTER — Telehealth: Payer: Self-pay | Admitting: Nurse Practitioner

## 2019-07-28 NOTE — Chronic Care Management (AMB) (Signed)
  Chronic Care Management   Outreach Note  07/28/2019 Name: Edward [name] MRN: [id number] DOB: 1950-10-30  Edward [name] is a 69 y.o. year old male who is a primary care patient of [name], [place]. I reached out to Edward [name] by phone today in response to a referral sent by Edward [name] [facility] health plan.     An unsuccessful telephone outreach was attempted today. The patient was referred to the case management team for assistance with care management and care coordination.   Follow Up Plan: A HIPPA compliant phone message was left for the patient providing contact information and requesting a return call. The care management team will reach out to the patient again over the next 7 days. If patient returns call to provider office, please advise to call [place] at [phone].  [place], [place] [address] Direct Dial: [phone] [email or web address]: [email or web address]

## 2019-07-29 ENCOUNTER — Telehealth: Payer: Medicare Other | Admitting: Cardiology

## 2019-07-31 DIAGNOSIS — Z23 Encounter for immunization: Secondary | ICD-10-CM | POA: Diagnosis not present

## 2019-08-03 NOTE — Chronic Care Management (AMB) (Signed)
  Chronic Care Management   Outreach Note  08/03/2019 Name: Edward [name] MRN: [id number] DOB: [birth year]  Edward [name] is a 69 y.o. year old male who is a primary care patient of [name], [place]. I reached out to Edward [name] by phone today in response to a referral sent by Edward [name] [facility] health plan.     A second unsuccessful telephone outreach was attempted today. The patient was referred to the case management team for assistance with care management and care coordination.   Follow Up Plan: A HIPPA compliant phone message was left for the patient providing contact information and requesting a return call. The care management team will reach out to the patient again over the next 7 days. If patient returns call to provider office, please advise to call [place] at [phone].  [place], [place] [address] Direct Dial: [phone] [email or web address]

## 2019-08-05 NOTE — Chronic Care Management (AMB) (Signed)
  Chronic Care Management   Outreach Note  08/05/2019 Name: [name] MRN: [id number] DOB: 08/20/1950  Edward [name] is a 69 y.o. year old male who is a primary care patient of [name], [place]. I reached out to Wilma Flavin by phone today in response to a referral sent by Mr. Bol Masiello [facility] health plan.     Third unsuccessful telephone outreach was attempted today. The patient was referred to the case management team for assistance with care management and care coordination. The patient's primary care provider has been notified of our unsuccessful attempts to make or maintain contact with the patient. The care management team is pleased to engage with this patient at any time in the future should he/she be interested in assistance from the care management team.   Follow Up Plan: A HIPPA compliant phone message was left for the patient providing contact information and requesting a return call. If patient returns call to provider office, please advise to call [place] at [phone].  [place], [place] [address] Direct Dial: [phone] [email or web address] Website: [email or web address]

## 2019-10-02 ENCOUNTER — Encounter: Payer: Self-pay | Admitting: Nurse Practitioner

## 2019-10-02 ENCOUNTER — Other Ambulatory Visit: Payer: Self-pay

## 2019-10-02 ENCOUNTER — Ambulatory Visit (INDEPENDENT_AMBULATORY_CARE_PROVIDER_SITE_OTHER): Payer: Medicare Other | Admitting: Nurse Practitioner

## 2019-10-02 VITALS — BP 120/70 | HR 76 | Temp 98.4°F | Ht 70.0 in | Wt 155.0 lb

## 2019-10-02 DIAGNOSIS — I25119 Atherosclerotic heart disease of native coronary artery with unspecified angina pectoris: Secondary | ICD-10-CM

## 2019-10-02 DIAGNOSIS — N1832 Chronic kidney disease, stage 3b: Secondary | ICD-10-CM | POA: Diagnosis not present

## 2019-10-02 DIAGNOSIS — E782 Mixed hyperlipidemia: Secondary | ICD-10-CM

## 2019-10-02 DIAGNOSIS — I10 Essential (primary) hypertension: Secondary | ICD-10-CM | POA: Diagnosis not present

## 2019-10-02 DIAGNOSIS — F5101 Primary insomnia: Secondary | ICD-10-CM | POA: Diagnosis not present

## 2019-10-02 DIAGNOSIS — E1121 Type 2 diabetes mellitus with diabetic nephropathy: Secondary | ICD-10-CM | POA: Diagnosis not present

## 2019-10-02 DIAGNOSIS — E1122 Type 2 diabetes mellitus with diabetic chronic kidney disease: Secondary | ICD-10-CM

## 2019-10-02 LAB — BAYER DCA HB A1C WAIVED: HB A1C (BAYER DCA - WAIVED): 6.5 % (ref <7.0)

## 2019-10-02 MED ORDER — METFORMIN HCL 1000 MG PO TABS: ORAL_TABLET | ORAL | 1 refills | Status: DC

## 2019-10-02 MED ORDER — ATORVASTATIN CALCIUM 40 MG PO TABS: 40.0000 mg | ORAL_TABLET | Freq: Every day | ORAL | 1 refills | Status: DC

## 2019-10-02 MED ORDER — ISOSORBIDE MONONITRATE ER 30 MG PO TB24: 30.0000 mg | ORAL_TABLET | Freq: Every day | ORAL | 1 refills | Status: DC

## 2019-10-02 MED ORDER — TRAZODONE HCL 100 MG PO TABS: 100.0000 mg | ORAL_TABLET | Freq: Every day | ORAL | 1 refills | Status: DC

## 2019-10-02 MED ORDER — LISINOPRIL 10 MG PO TABS: 10.0000 mg | ORAL_TABLET | Freq: Every day | ORAL | 1 refills | Status: DC

## 2019-10-02 MED ORDER — GEMFIBROZIL 600 MG PO TABS: ORAL_TABLET | ORAL | 1 refills | Status: DC

## 2019-10-02 MED ORDER — GLIPIZIDE 10 MG PO TABS: ORAL_TABLET | ORAL | 1 refills | Status: DC

## 2019-10-02 MED ORDER — METOPROLOL TARTRATE 25 MG PO TABS: 25.0000 mg | ORAL_TABLET | Freq: Two times a day (BID) | ORAL | 1 refills | Status: DC

## 2019-10-02 NOTE — Patient Instructions (Signed)
Diabetes Mellitus and Foot Care Foot care is an important part of your health, especially when you have diabetes. Diabetes may cause you to have problems because of poor blood flow (circulation) to your feet and legs, which can cause your skin to:  Become thinner and drier.  Break more easily.  Heal more slowly.  Peel and crack. You may also have nerve damage (neuropathy) in your legs and feet, causing decreased feeling in them. This means that you may not notice minor injuries to your feet that could lead to more serious problems. Noticing and addressing any potential problems early is the best way to prevent future foot problems. How to care for your feet Foot hygiene  Wash your feet daily with warm water and mild soap. Do not use hot water. Then, pat your feet and the areas between your toes until they are completely dry. Do not soak your feet as this can dry your skin.  Trim your toenails straight across. Do not dig under them or around the cuticle. File the edges of your nails with an emery board or nail file.  Apply a moisturizing lotion or petroleum jelly to the skin on your feet and to dry, brittle toenails. Use lotion that does not contain alcohol and is unscented. Do not apply lotion between your toes. Shoes and socks  Wear clean socks or stockings every day. Make sure they are not too tight. Do not wear knee-high stockings since they may decrease blood flow to your legs.  Wear shoes that fit properly and have enough cushioning. Always look in your shoes before you put them on to be sure there are no objects inside.  To break in new shoes, wear them for just a few hours a day. This prevents injuries on your feet. Wounds, scrapes, corns, and calluses  Check your feet daily for blisters, cuts, bruises, sores, and redness. If you cannot see the bottom of your feet, use a mirror or ask someone for help.  Do not cut corns or calluses or try to remove them with medicine.  If you  find a minor scrape, cut, or break in the skin on your feet, keep it and the skin around it clean and dry. You may clean these areas with mild soap and water. Do not clean the area with peroxide, alcohol, or iodine.  If you have a wound, scrape, corn, or callus on your foot, look at it several times a day to make sure it is healing and not infected. Check for: ? Redness, swelling, or pain. ? Fluid or blood. ? Warmth. ? Pus or a bad smell. General instructions  Do not cross your legs. This may decrease blood flow to your feet.  Do not use heating pads or hot water bottles on your feet. They may burn your skin. If you have lost feeling in your feet or legs, you may not know this is happening until it is too late.  Protect your feet from hot and cold by wearing shoes, such as at the beach or on hot pavement.  Schedule a complete foot exam at least once a year (annually) or more often if you have foot problems. If you have foot problems, report any cuts, sores, or bruises to your health care provider immediately. Contact a health care provider if:  You have a medical condition that increases your risk of infection and you have any cuts, sores, or bruises on your feet.  You have an injury that is not   healing.  You have redness on your legs or feet.  You feel burning or tingling in your legs or feet.  You have pain or cramps in your legs and feet.  Your legs or feet are numb.  Your feet always feel cold.  You have pain around a toenail. Get help right away if:  You have a wound, scrape, corn, or callus on your foot and: ? You have pain, swelling, or redness that gets worse. ? You have fluid or blood coming from the wound, scrape, corn, or callus. ? Your wound, scrape, corn, or callus feels warm to the touch. ? You have pus or a bad smell coming from the wound, scrape, corn, or callus. ? You have a fever. ? You have a red line going up your leg. Summary  Check your feet every day  for cuts, sores, red spots, swelling, and blisters.  Moisturize feet and legs daily.  Wear shoes that fit properly and have enough cushioning.  If you have foot problems, report any cuts, sores, or bruises to your health care provider immediately.  Schedule a complete foot exam at least once a year (annually) or more often if you have foot problems. This information is not intended to replace advice given to you by your health care provider. Make sure you discuss any questions you have with your health care provider. Document Revised: 01/14/2019 Document Reviewed: 05/25/2016 Elsevier Patient Education  2020 [employer]  

## 2019-10-02 NOTE — Progress Notes (Signed)
Subjective:    Patient ID: Edward [name], male    DOB: July 04, 1950, 69 y.o.   MRN: [id number]   Chief Complaint: medical management of chronic issues     HPI:  1. Essential hypertension No c/o chest pain, ob or headache. Doe not check blood pressure at home. BP Readings from Last 3 Encounters:  03/20/19 134/69  12/31/18 124/72  12/16/18 132/75     2. Atherosclerosis of native coronary artery of native heart with angina pectoris ([place]) Last aw cardiology on 12/31/18. At that time he was having occasional chest pain. They said they wold do cardiac cath if continued. He has had to have procedure done. The imdur he is on ha really helped with his intermittent chest pain.  3. Type 2 diabetes mellitus with stage 3b chronic kidney disease, without long-term current use of insulin (HCC) Fasting blood sugars are aroundd 120 and below. Some day it I in the 50's. He does not check it every day. Lab Results  Component Value Date   HGBA1C 6.9 03/20/2019     4. Mixed hyperlipidemia Does not watch diet and does very little exercise. Lab Results  Component Value Date   CHOL 105 03/20/2019   HDL 28 (L) 03/20/2019   LDLCALC 56 03/20/2019   TRIG 115 03/20/2019   CHOLHDL 3.8 03/20/2019     5. Stage 3b chronic kidney disease No problems voiding Lab Results  Component Value Date   CREATININE 1.62 (H) 03/20/2019   BUN 20 03/20/2019   NA 139 03/20/2019   K 5.8 (H) 03/20/2019   CL 103 03/20/2019   CO2 17 (L) 03/20/2019     6. Primary insomnia Patient takes trazadone nightly to sleep    Outpatient Encounter Medications as of 10/02/2019  Medication Sig  . ACCU-CHEK AVIVA PLUS test strip USE TO CHECK BLOOD SUGAR ONCE DAILY  . aspirin 81 MG tablet Take 81 mg by mouth daily.    [name] atorvastatin (LIPITOR) 40 MG tablet Take 1 tablet (40 mg total) by mouth daily.  . brimonidine (ALPHAGAN) 0.15 % ophthalmic solution Place 1 drop into both eyes 2 (two) times daily.  . Cholecalciferol  (VITAMIN D3) 2000 UNITS TABS Take 1 tablet by mouth daily.  . fish oil-omega-3 fatty acids 1000 MG capsule Take 1 g by mouth 2 (two) times daily.    [name] gemfibrozil (LOPID) 600 MG tablet TAKE 1 TABLET BY MOUTH TWICE DAILY BEFORE MEAL(S)  . glipiZIDE (GLUCOTROL) 10 MG tablet TAKE 1 TABLET BY MOUTH TWICE DAILY BEFORE MEAL(S)  . isosorbide mononitrate (IMDUR) 30 MG 24 hr tablet Take 1 tablet (30 mg total) by mouth daily. New 01/30/2018  . lisinopril (ZESTRIL) 10 MG tablet Take 1 tablet (10 mg total) by mouth daily.  . metFORMIN (GLUCOPHAGE) 1000 MG tablet TAKE 1/2 (ONE-HALF) TABLET BY MOUTH IN THE MORNING AND 1 IN THE EVENING  . metoprolol tartrate (LOPRESSOR) 25 MG tablet Take 1 tablet (25 mg total) by mouth 2 (two) times daily.  . nitroGLYCERIN (NITROSTAT) 0.4 MG SL tablet Place 1 tablet (0.4 mg total) under the tongue every 5 (five) minutes x 3 doses as needed for chest pain (if no relief after 3rd dose, proceed to the ED for an evaluation).  . traZODone (DESYREL) 100 MG tablet Take 1 tablet (100 mg total) by mouth at bedtime.    Past Surgical History:  Procedure Laterality Date  . CATARACT EXTRACTION, BILATERAL    . CORONARY ANGIOPLASTY WITH STENT PLACEMENT  2006  Family History  Problem Relation Age of Onset  . Aneurysm Father        Brain aneurysm  . Lung cancer Mother        Small cell carcinoma of lung,kidney and heart  . Heart disease Mother   . Cancer Brother        lungs  . Diabetes Sister   . Diabetes Sister   . Diabetes Brother   . Diabetes Brother     New complaints: None today  Social history: Live by hisself- Is retired  Controlled substance contract: n/a    Review of Systems  Constitutional: Negative for diaphoresis.  Eyes: Negative for pain.  Respiratory: Negative for shortness of breath.   Cardiovascular: Negative for chest pain, palpitations and leg swelling.  Gastrointestinal: Negative for abdominal pain.  Endocrine: Negative for polydipsia.  Skin:  Negative for rash.  Neurological: Negative for dizziness, weakness and headaches.  Hematological: Does not bruise/bleed easily.  All other systems reviewed and are negative.      Objective:   Physical Exam Vitals and nursing note reviewed.  Constitutional:      Appearance: Normal appearance. He is well-developed.  HENT:     Head: Normocephalic.     Nose: Nose normal.  Eyes:     Pupils: Pupils are equal, round, and reactive to light.  Neck:     Thyroid: No thyroid mass or thyromegaly.     Vascular: No carotid bruit or JVD.     Trachea: Phonation normal.  Cardiovascular:     Rate and Rhythm: Normal rate and regular rhythm.  Pulmonary:     Effort: Pulmonary effort is normal. No respiratory distress.     Breath sounds: Normal breath sounds.  Abdominal:     General: Bowel sounds are normal.     Palpations: Abdomen is soft.     Tenderness: There is no abdominal tenderness.  Musculoskeletal:        General: Normal range of motion.     Cervical back: Normal range of motion and neck supple.  Lymphadenopathy:     Cervical: No cervical adenopathy.  Skin:    General: Skin is warm and dry.  Neurological:     Mental Status: He is alert and oriented to person, place, and time.  Psychiatric:        Behavior: Behavior normal.        Thought Content: Thought content normal.        Judgment: Judgment normal.     Blood pressure 120/70, pulse 76, temperature 98.4 F (36.9 C), height 5' 10"  (1.778 m), weight 155 lb (70.3 kg), SpO2 97 %.   hgba1c 6.5%     Assessment & Plan:  Edward [name] comes in today with chief complaint of Medical Management of Chronic Issues   Diagnosis and orders addressed:  1. Essential hypertension Low odium diet - CBC with Differential/Platelet - CMP14+EGFR - metoprolol tartrate (LOPRESSOR) 25 MG tablet; Take 1 tablet (25 mg total) by mouth 2 (two) times daily.  Dispense: 180 tablet; Refill: 1  2. Atherosclerosis of native coronary artery of native  heart with angina pectoris ([place]) Keep follow up appointment with cardiology - isosorbide mononitrate (IMDUR) 30 MG 24 hr tablet; Take 1 tablet (30 mg total) by mouth daily. New 01/30/2018  Dispense: 90 tablet; Refill: 1  3. Type 2 diabetes mellitus with stage 3b chronic kidney disease, without long-term current use of insulin (HCC) Eat bedtime snack so blood sugar does not drop at night. -  Bayer DCA Hb A1c Waived - lisinopril (ZESTRIL) 10 MG tablet; Take 1 tablet (10 mg total) by mouth daily.  Dispense: 90 tablet; Refill: 1 - metFORMIN (GLUCOPHAGE) 1000 MG tablet; TAKE 1/2 (ONE-HALF) TABLET BY MOUTH IN THE MORNING AND 1 IN THE EVENING  Dispense: 180 tablet; Refill: 1 - glipiZIDE (GLUCOTROL) 10 MG tablet; TAKE 1 TABLET BY MOUTH TWICE DAILY BEFORE MEAL(S)  Dispense: 180 tablet; Refill: 1  4. Mixed hyperlipidemia Low fat diet - Lipid panel - atorvastatin (LIPITOR) 40 MG tablet; Take 1 tablet (40 mg total) by mouth daily.  Dispense: 90 tablet; Refill: 1 - gemfibrozil (LOPID) 600 MG tablet; TAKE 1 TABLET BY MOUTH TWICE DAILY BEFORE MEAL(S)  Dispense: 180 tablet; Refill: 1  5. Stage 3b chronic kidney disease labs pending  6. Primary insomnia Bedtime routine reviewed - traZODone (DESYREL) 100 MG tablet; Take 1 tablet (100 mg total) by mouth at bedtime.  Dispense: 90 tablet; Refill: 1   Labs pending Health Maintenance reviewed Diet and exercise encouraged  Follow up plan: 3 month   [place], FNP

## 2019-10-03 LAB — LIPID PANEL
Chol/HDL Ratio: 5 ratio (ref 0.0–5.0)
Cholesterol, Total: 119 mg/dL (ref 100–199)
HDL: 24 mg/dL — ABNORMAL LOW (ref >39)
LDL Chol Calc (NIH): 50 mg/dL (ref 0–99)
Triglycerides: 286 mg/dL — ABNORMAL HIGH (ref 0–149)
VLDL Cholesterol Cal: 45 mg/dL — ABNORMAL HIGH (ref 5–40)

## 2019-10-03 LAB — CBC WITH DIFFERENTIAL/PLATELET
Basophils Absolute: 0 10*3/uL (ref 0.0–0.2)
Basos: 0 %
EOS (ABSOLUTE): 0.1 10*3/uL (ref 0.0–0.4)
Eos: 3 %
Hematocrit: 38.3 % (ref 37.5–51.0)
Hemoglobin: 13.1 g/dL (ref 13.0–17.7)
Immature Grans (Abs): 0 10*3/uL (ref 0.0–0.1)
Immature Granulocytes: 0 %
Lymphocytes Absolute: 1.7 10*3/uL (ref 0.7–3.1)
Lymphs: 36 %
MCH: 34.2 pg — ABNORMAL HIGH (ref 26.6–33.0)
MCHC: 34.2 g/dL (ref 31.5–35.7)
MCV: 100 fL — ABNORMAL HIGH (ref 79–97)
Monocytes Absolute: 0.4 10*3/uL (ref 0.1–0.9)
Monocytes: 8 %
Neutrophils Absolute: 2.5 10*3/uL (ref 1.4–7.0)
Neutrophils: 53 %
Platelets: 95 10*3/uL — CL (ref 150–450)
RBC: 3.83 x10E6/uL — ABNORMAL LOW (ref 4.14–5.80)
RDW: 13.2 % (ref 11.6–15.4)
WBC: 4.8 10*3/uL (ref 3.4–10.8)

## 2019-10-03 LAB — CMP14+EGFR
ALT: 19 IU/L (ref 0–44)
AST: 32 IU/L (ref 0–40)
Albumin/Globulin Ratio: 1.9 (ref 1.2–2.2)
Albumin: 4.5 g/dL (ref 3.8–4.8)
Alkaline Phosphatase: 58 IU/L (ref 48–121)
BUN/Creatinine Ratio: 12 (ref 10–24)
BUN: 16 mg/dL (ref 8–27)
Bilirubin Total: 0.5 mg/dL (ref 0.0–1.2)
CO2: 17 mmol/L — ABNORMAL LOW (ref 20–29)
Calcium: 9.4 mg/dL (ref 8.6–10.2)
Chloride: 107 mmol/L — ABNORMAL HIGH (ref 96–106)
Creatinine, Ser: 1.36 mg/dL — ABNORMAL HIGH (ref 0.76–1.27)
GFR calc Af Amer: 62 mL/min/{1.73_m2} (ref >59)
GFR calc non Af Amer: 53 mL/min/{1.73_m2} — ABNORMAL LOW (ref >59)
Globulin, Total: 2.4 g/dL (ref 1.5–4.5)
Glucose: 96 mg/dL (ref 65–99)
Potassium: 5.2 mmol/L (ref 3.5–5.2)
Sodium: 140 mmol/L (ref 134–144)
Total Protein: 6.9 g/dL (ref 6.0–8.5)

## 2019-10-04 NOTE — Progress Notes (Signed)
Attempted to contact patient about platlet count- no anwer- I called 2x on aturday and 2 so far today

## 2019-10-06 ENCOUNTER — Other Ambulatory Visit: Payer: Medicare Other

## 2019-10-06 ENCOUNTER — Telehealth: Payer: Self-pay

## 2019-10-06 ENCOUNTER — Other Ambulatory Visit: Payer: Self-pay

## 2019-10-06 ENCOUNTER — Telehealth: Payer: Self-pay | Admitting: Nurse Practitioner

## 2019-10-06 DIAGNOSIS — D691 Qualitative platelet defects: Secondary | ICD-10-CM | POA: Diagnosis not present

## 2019-10-06 NOTE — Telephone Encounter (Signed)
Pts wife called requesting that either MMM or nurse call her back regarding pts lab results.

## 2019-10-06 NOTE — Telephone Encounter (Signed)
Attempted to contact patient numerous times about labs with no response. Left message on patients voicemail and attempted to contact wifes phone but unable to leave a message with her. Will continue to try and contact

## 2019-10-06 NOTE — Telephone Encounter (Signed)
Wife returning your call on platlets 95. Please advise.

## 2019-10-07 LAB — CBC WITH DIFFERENTIAL/PLATELET
Basophils Absolute: 0 10*3/uL (ref 0.0–0.2)
Basos: 1 %
EOS (ABSOLUTE): 0.2 10*3/uL (ref 0.0–0.4)
Eos: 3 %
Hematocrit: 36.8 % — ABNORMAL LOW (ref 37.5–51.0)
Hemoglobin: 12.2 g/dL — ABNORMAL LOW (ref 13.0–17.7)
Immature Grans (Abs): 0 10*3/uL (ref 0.0–0.1)
Immature Granulocytes: 0 %
Lymphocytes Absolute: 2.4 10*3/uL (ref 0.7–3.1)
Lymphs: 40 %
MCH: 33.2 pg — ABNORMAL HIGH (ref 26.6–33.0)
MCHC: 33.2 g/dL (ref 31.5–35.7)
MCV: 100 fL — ABNORMAL HIGH (ref 79–97)
Monocytes Absolute: 0.5 10*3/uL (ref 0.1–0.9)
Monocytes: 8 %
Neutrophils Absolute: 3 10*3/uL (ref 1.4–7.0)
Neutrophils: 48 %
Platelets: 99 10*3/uL — CL (ref 150–450)
RBC: 3.67 x10E6/uL — ABNORMAL LOW (ref 4.14–5.80)
RDW: 13.2 % (ref 11.6–15.4)
WBC: 6.1 10*3/uL (ref 3.4–10.8)

## 2019-10-07 NOTE — Telephone Encounter (Signed)
Aware of results and referral.

## 2019-10-07 NOTE — Telephone Encounter (Signed)
rc for lab results

## 2019-10-09 ENCOUNTER — Encounter: Payer: Self-pay | Admitting: Cardiology

## 2019-10-09 ENCOUNTER — Other Ambulatory Visit: Payer: Self-pay

## 2019-10-09 ENCOUNTER — Ambulatory Visit (INDEPENDENT_AMBULATORY_CARE_PROVIDER_SITE_OTHER): Payer: Medicare Other | Admitting: Cardiology

## 2019-10-09 VITALS — BP 120/60 | HR 70 | Ht 70.0 in | Wt 160.2 lb

## 2019-10-09 DIAGNOSIS — I25119 Atherosclerotic heart disease of native coronary artery with unspecified angina pectoris: Secondary | ICD-10-CM

## 2019-10-09 DIAGNOSIS — I10 Essential (primary) hypertension: Secondary | ICD-10-CM

## 2019-10-09 DIAGNOSIS — E782 Mixed hyperlipidemia: Secondary | ICD-10-CM

## 2019-10-09 NOTE — Progress Notes (Signed)
Cardiology Office Note  Date: 10/09/2019   ID: Edward [name], DOB 01-May-1951, MRN [id number]  PCP:  [name], FNP  Cardiologist:  Rozann Lesches, MD Electrophysiologist:  None   Chief Complaint  Patient presents with  . Cardiac follow-up    History of Present Illness: Edward [name] is a 69 y.o. male last seen in August 2020.  He presents for a routine visit.  Fortunately, he does not report any significant angina at this time on current medical regimen.  Lexiscan Myoviewin October 2019revealed a region of anterior, anteroseptal scar as well as a region of inferior/inferolateral scar with moderate peri-infarct ischemia. LVEF was 53%. He is several years out from BMS to the LAD in 2006, also had residual distal RCA disease that was managed medically.  He has preferred to hold off on cardiac catheterization unless symptoms escalate.  I reviewed his current regimen which is stable and outlined below.  Past Medical History:  Diagnosis Date  . Cataract   . CKD (chronic kidney disease) stage 3, GFR 30-59 ml/min   . Coronary atherosclerosis of native coronary artery    BMS LAD 2006; residual 75% distal RCA; EF 50%  . Essential hypertension   . Glaucoma   . Low back pain   . MI (myocardial infarction) ([place])    Anterior 2006  . Mixed hyperlipidemia   . Renal insufficiency   . Type 2 diabetes mellitus ([place])     Past Surgical History:  Procedure Laterality Date  . CATARACT EXTRACTION, BILATERAL    . CORONARY ANGIOPLASTY WITH STENT PLACEMENT  2006    Current Outpatient Medications  Medication Sig Dispense Refill  . ACCU-CHEK AVIVA PLUS test strip USE TO CHECK BLOOD SUGAR ONCE DAILY 100 each 3  . aspirin 81 MG tablet Take 81 mg by mouth daily.      [name] atorvastatin (LIPITOR) 40 MG tablet Take 1 tablet (40 mg total) by mouth daily. 90 tablet 1  . brimonidine (ALPHAGAN) 0.15 % ophthalmic solution Place 1 drop into both eyes 2 (two) times daily.    .  Cholecalciferol (VITAMIN D3) 2000 UNITS TABS Take 1 tablet by mouth daily.    . fish oil-omega-3 fatty acids 1000 MG capsule Take 1 g by mouth 2 (two) times daily.      [name] gemfibrozil (LOPID) 600 MG tablet TAKE 1 TABLET BY MOUTH TWICE DAILY BEFORE MEAL(S) 180 tablet 1  . glipiZIDE (GLUCOTROL) 10 MG tablet TAKE 1 TABLET BY MOUTH TWICE DAILY BEFORE MEAL(S) 180 tablet 1  . isosorbide mononitrate (IMDUR) 30 MG 24 hr tablet Take 1 tablet (30 mg total) by mouth daily. New 01/30/2018 90 tablet 1  . lisinopril (ZESTRIL) 10 MG tablet Take 1 tablet (10 mg total) by mouth daily. 90 tablet 1  . metFORMIN (GLUCOPHAGE) 1000 MG tablet TAKE 1/2 (ONE-HALF) TABLET BY MOUTH IN THE MORNING AND 1 IN THE EVENING 180 tablet 1  . metoprolol tartrate (LOPRESSOR) 25 MG tablet Take 1 tablet (25 mg total) by mouth 2 (two) times daily. 180 tablet 1  . nitroGLYCERIN (NITROSTAT) 0.4 MG SL tablet Place 1 tablet (0.4 mg total) under the tongue every 5 (five) minutes x 3 doses as needed for chest pain (if no relief after 3rd dose, proceed to the ED for an evaluation). 25 tablet 1  . traZODone (DESYREL) 100 MG tablet Take 1 tablet (100 mg total) by mouth at bedtime. 90 tablet 1   No current facility-administered medications for this visit.  Allergies:  Patient has no known allergies.   ROS:  No palpitations or syncope.  Physical Exam: VS:  BP 120/60   Pulse 70   Ht 5\' 10"  (1.778 m)   Wt 160 lb 3.2 oz (72.7 kg)   SpO2 98%   BMI 22.99 kg/m , BMI Body mass index is 22.99 kg/m.  Wt Readings from Last 3 Encounters:  10/09/19 160 lb 3.2 oz (72.7 kg)  10/02/19 155 lb (70.3 kg)  03/20/19 157 lb 6.4 oz (71.4 kg)    General: Patient appears comfortable at rest. HEENT: Conjunctiva and lids normal, wearing a mask. Neck: Supple, no elevated JVP or carotid bruits, no thyromegaly. Lungs: Clear to auscultation, nonlabored breathing at rest. Cardiac: Regular rate and rhythm, no S3 or significant systolic murmur, no pericardial  rub. Extremities: No pitting edema, distal pulses 2+.  ECG:  An ECG dated 12/31/2018 was personally reviewed today and demonstrated:  Sinus bradycardia with decreased R wave progression and low voltage.  Recent Labwork: 10/02/2019: ALT 19; AST 32; BUN 16; Creatinine, Ser 1.36; Potassium 5.2; Sodium 140 10/06/2019: Hemoglobin 12.2; Platelets 99     Component Value Date/Time   CHOL 119 10/02/2019 0803   CHOL 177 09/25/2012 1015   TRIG 286 (H) 10/02/2019 0803   TRIG 206 (H) 10/07/2014 0927   TRIG 269 (H) 09/25/2012 1015   HDL 24 (L) 10/02/2019 0803   HDL 22 (L) 10/07/2014 0927   HDL 28 (L) 09/25/2012 1015   CHOLHDL 5.0 10/02/2019 0803   LDLCALC 50 10/02/2019 0803   LDLCALC 98 12/21/2013 0850   LDLCALC 95 09/25/2012 1015    Other Studies Reviewed Today:  [facility] Adam Myoview 02/04/2018:  There was no ST segment deviation noted during stress.  Defect 1: There is a medium defect of severe severity present in the mid anteroseptal, apical anterior, apical septal and apex location. This is consistent with myocardial scar.  Defect 2: There is a medium defect of moderate severity present in the mid inferolateral, apical inferior and apical lateral location. This is consistent with scar with a moderate degree of peri-infarct ischemia.  Findings consistent with prior myocardial infarction with moderate peri-infarct ischemia in the inferolateral wall.  This is an intermediate risk study.  Nuclear stress EF: 53%.  Assessment and Plan:  1.  CAD status post BMS to the LAD in 2006 with 75% residual RCA stenosis.  He has been managed medically in the absence of progressive angina symptoms.  Continue aspirin, Lipitor, Imdur, lisinopril, and Lopressor.  2.  Mixed hyperlipidemia, he continues on Lipitor.  Recent LDL 50.  3.  Essential hypertension, blood pressure is well controlled today.  No changes made to current regimen.  Medication Adjustments/Labs and Tests Ordered: Current medicines are  reviewed at length with the patient today.  Concerns regarding medicines are outlined above.   Tests Ordered: No orders of the defined types were placed in this encounter.   Medication Changes: No orders of the defined types were placed in this encounter.   Disposition:  Follow up 6 months in the [place] office.  Signed, Satira Sark, MD, [facility] 10/09/2019 2:42 PM    [facility] [place], [place], [place] [address] Phone: [phone]; Fax: [phone]

## 2019-10-09 NOTE — Patient Instructions (Addendum)
Medication Instructions:    Your physician recommends that you continue on your current medications as directed. Please refer to the Current Medication list given to you today.  Labwork:  NONE  Testing/Procedures:  NONE  Follow-Up:  Your physician recommends that you schedule a follow-up appointment in: 6 months (office)  Any Other Special Instructions Will Be Listed Below (If Applicable).  If you need a refill on your cardiac medications before your next appointment, please call your pharmacy. 

## 2019-10-14 ENCOUNTER — Other Ambulatory Visit: Payer: Self-pay

## 2019-10-14 ENCOUNTER — Inpatient Hospital Stay (HOSPITAL_COMMUNITY): Payer: Medicare Other

## 2019-10-14 ENCOUNTER — Encounter (HOSPITAL_COMMUNITY): Payer: Self-pay | Admitting: Hematology

## 2019-10-14 ENCOUNTER — Inpatient Hospital Stay (HOSPITAL_COMMUNITY): Payer: Medicare Other | Attending: Hematology | Admitting: Hematology

## 2019-10-14 VITALS — BP 117/64 | HR 65 | Temp 98.6°F | Resp 16 | Wt 158.0 lb

## 2019-10-14 DIAGNOSIS — Z79899 Other long term (current) drug therapy: Secondary | ICD-10-CM | POA: Insufficient documentation

## 2019-10-14 DIAGNOSIS — N183 Chronic kidney disease, stage 3 unspecified: Secondary | ICD-10-CM | POA: Diagnosis not present

## 2019-10-14 DIAGNOSIS — Z7982 Long term (current) use of aspirin: Secondary | ICD-10-CM | POA: Diagnosis not present

## 2019-10-14 DIAGNOSIS — Z87891 Personal history of nicotine dependence: Secondary | ICD-10-CM | POA: Diagnosis not present

## 2019-10-14 DIAGNOSIS — Z8249 Family history of ischemic heart disease and other diseases of the circulatory system: Secondary | ICD-10-CM | POA: Insufficient documentation

## 2019-10-14 DIAGNOSIS — I10 Essential (primary) hypertension: Secondary | ICD-10-CM | POA: Diagnosis not present

## 2019-10-14 DIAGNOSIS — E119 Type 2 diabetes mellitus without complications: Secondary | ICD-10-CM | POA: Insufficient documentation

## 2019-10-14 DIAGNOSIS — I252 Old myocardial infarction: Secondary | ICD-10-CM | POA: Diagnosis not present

## 2019-10-14 DIAGNOSIS — D696 Thrombocytopenia, unspecified: Secondary | ICD-10-CM

## 2019-10-14 DIAGNOSIS — Z833 Family history of diabetes mellitus: Secondary | ICD-10-CM | POA: Diagnosis not present

## 2019-10-14 DIAGNOSIS — E875 Hyperkalemia: Secondary | ICD-10-CM | POA: Insufficient documentation

## 2019-10-14 DIAGNOSIS — Z800 Family history of malignant neoplasm of digestive organs: Secondary | ICD-10-CM | POA: Diagnosis not present

## 2019-10-14 DIAGNOSIS — Z7984 Long term (current) use of oral hypoglycemic drugs: Secondary | ICD-10-CM | POA: Diagnosis not present

## 2019-10-14 DIAGNOSIS — Z801 Family history of malignant neoplasm of trachea, bronchus and lung: Secondary | ICD-10-CM | POA: Diagnosis not present

## 2019-10-14 LAB — COMPREHENSIVE METABOLIC PANEL
ALT: 25 U/L (ref 0–44)
AST: 36 U/L (ref 15–41)
Albumin: 3.8 g/dL (ref 3.5–5.0)
Alkaline Phosphatase: 42 U/L (ref 38–126)
Anion gap: 9 (ref 5–15)
BUN: 22 mg/dL (ref 8–23)
CO2: 21 mmol/L — ABNORMAL LOW (ref 22–32)
Calcium: 9.1 mg/dL (ref 8.9–10.3)
Chloride: 108 mmol/L (ref 98–111)
Creatinine, Ser: 1.34 mg/dL — ABNORMAL HIGH (ref 0.61–1.24)
GFR calc Af Amer: >60 mL/min (ref >60)
GFR calc non Af Amer: 54 mL/min — ABNORMAL LOW (ref >60)
Glucose, Bld: 123 mg/dL — ABNORMAL HIGH (ref 70–99)
Potassium: 5.5 mmol/L — ABNORMAL HIGH (ref 3.5–5.1)
Sodium: 138 mmol/L (ref 135–145)
Total Bilirubin: 0.9 mg/dL (ref 0.3–1.2)
Total Protein: 7.1 g/dL (ref 6.5–8.1)

## 2019-10-14 LAB — CBC WITH DIFFERENTIAL/PLATELET
Abs Immature Granulocytes: 0.01 10*3/uL (ref 0.00–0.07)
Basophils Absolute: 0 10*3/uL (ref 0.0–0.1)
Basophils Relative: 1 %
Eosinophils Absolute: 0.1 10*3/uL (ref 0.0–0.5)
Eosinophils Relative: 3 %
HCT: 36.7 % — ABNORMAL LOW (ref 39.0–52.0)
Hemoglobin: 12.1 g/dL — ABNORMAL LOW (ref 13.0–17.0)
Immature Granulocytes: 0 %
Lymphocytes Relative: 30 %
Lymphs Abs: 1.4 10*3/uL (ref 0.7–4.0)
MCH: 33.4 pg (ref 26.0–34.0)
MCHC: 33 g/dL (ref 30.0–36.0)
MCV: 101.4 fL — ABNORMAL HIGH (ref 80.0–100.0)
Monocytes Absolute: 0.4 10*3/uL (ref 0.1–1.0)
Monocytes Relative: 9 %
Neutro Abs: 2.8 10*3/uL (ref 1.7–7.7)
Neutrophils Relative %: 57 %
Platelets: 94 10*3/uL — ABNORMAL LOW (ref 150–400)
RBC: 3.62 MIL/uL — ABNORMAL LOW (ref 4.22–5.81)
RDW: 13.1 % (ref 11.5–15.5)
WBC: 4.8 10*3/uL (ref 4.0–10.5)
nRBC: 0 % (ref 0.0–0.2)

## 2019-10-14 LAB — HEPATITIS B SURFACE ANTIBODY,QUALITATIVE: Hep B S Ab: NONREACTIVE

## 2019-10-14 LAB — FERRITIN: Ferritin: 56 ng/mL (ref 24–336)

## 2019-10-14 LAB — LACTATE DEHYDROGENASE: LDH: 137 U/L (ref 98–192)

## 2019-10-14 LAB — IRON AND TIBC
Iron: 85 ug/dL (ref 45–182)
Saturation Ratios: 22 % (ref 17.9–39.5)
TIBC: 382 ug/dL (ref 250–450)
UIBC: 297 ug/dL

## 2019-10-14 LAB — HEPATITIS B SURFACE ANTIGEN: Hepatitis B Surface Ag: NONREACTIVE

## 2019-10-14 LAB — HEPATITIS C ANTIBODY: HCV Ab: NONREACTIVE

## 2019-10-14 LAB — VITAMIN B12: Vitamin B-12: 292 pg/mL (ref 180–914)

## 2019-10-14 LAB — SEDIMENTATION RATE: Sed Rate: 20 mm/hr — ABNORMAL HIGH (ref 0–16)

## 2019-10-14 LAB — FOLATE: Folate: 10.6 ng/mL (ref >5.9)

## 2019-10-14 LAB — HEPATITIS B CORE ANTIBODY, TOTAL: Hep B Core Total Ab: NONREACTIVE

## 2019-10-14 MED ORDER — SODIUM POLYSTYRENE SULFONATE 15 GM/60ML PO SUSP: 60.0000 g | Freq: Once | ORAL | 0 refills | Status: AC

## 2019-10-14 NOTE — Patient Instructions (Signed)
[place] at [facility] Discharge Instructions  You were seen today by Dr. Delton Coombes. He went over your recent results. Dr. Delton Coombes will see you back in 4-5 weeks for follow up.   Thank you for choosing [place] at [facility] to provide your oncology and hematology care.  To afford each patient quality time with our provider, please arrive at least 15 minutes before your scheduled appointment time.   If you have a lab appointment with the [facility] please come in thru the Main Entrance and check in at the main information desk  You need to re-schedule your appointment should you arrive 10 or more minutes late.  We strive to give you quality time with our providers, and arriving late affects you and other patients whose appointments are after yours.  Also, if you no show three or more times for appointments you may be dismissed from the clinic at the providers discretion.     Again, thank you for choosing [facility].  Our hope is that these requests will decrease the amount of time that you wait before being seen by our physicians.       _____________________________________________________________  Should you have questions after your visit to [facility], please contact our office at [phone] between the hours of 8:00 a.m. and 4:30 p.m.  Voicemails left after 4:00 p.m. will not be returned until the following business day.  For prescription refill requests, have your pharmacy contact our office and allow 72 hours.    Cancer Center Support Programs:   > Cancer Support Group  2nd Tuesday of the month 1pm-2pm, Journey Room

## 2019-10-14 NOTE — Progress Notes (Signed)
[place], [place] [address]   CLINIC:  Medical Oncology/Hematology  CONSULT NOTE  Patient Care Team: [name], FNP as PCP - General (Nurse Practitioner) Satira Sark, MD as PCP - Cardiology (Cardiology) Danice Goltz, MD as Consulting Physician (Ophthalmology)  CHIEF COMPLAINTS/PURPOSE OF CONSULTATION:  Thrombocytopenia  HISTORY OF PRESENTING ILLNESS:  Edward [name] 69 y.o. male is here because of thrombocytopenia, at the request of Mary-Margaret Hassell Done from [facility]. He had a low platelet count on 10/02/2019 and on 10/06/2019.  Today he reports feeling well. He reports easy bruising for the past year. He reports having diarrhea but no acid reflux. He denies any abnormal bleeding, hematuria, hematochezia or melena. He denies having any F/C/night sweats. He has no CP or cough. He denies having any numbness/tingling or any history of neuropathy. He had an MI in 2006 with stents placed. He has never had any blood transfusions and no history of hepatitis.  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 He quit smoking 34 years ago after smoking 2 PPD for 30 years. His brother had thrombocytopenia; mother had colon cancer; brother and 3 sisters had lung cancer after smoking; uncles and aunts on both sides smoked and had lung cancer.   MEDICAL HISTORY:  Past Medical History:  Diagnosis Date  . Cataract   . CKD (chronic kidney disease) stage 3, GFR 30-59 ml/min   . Coronary atherosclerosis of native coronary artery    BMS LAD 2006; residual 75% distal RCA; EF 50%  . Essential hypertension   . Glaucoma   . Low back pain   . MI (myocardial infarction) ([facility])    Anterior 2006  . Mixed hyperlipidemia   . Renal insufficiency   . Type 2 diabetes mellitus ([facility])     SURGICAL HISTORY: Past Surgical History:  Procedure Laterality Date  . CATARACT  EXTRACTION, BILATERAL    . CORONARY ANGIOPLASTY WITH STENT PLACEMENT  2006    SOCIAL HISTORY: Social History   Socioeconomic History  . Marital status: Married    Spouse name: Not on file  . Number of children: Not on file  . Years of education: 32th  . Highest education level: 7th grade  Occupational History  . Occupation: Retired  Tobacco Use  . Smoking status: Former Smoker    Packs/day: 2.00    Years: 20.00    Pack years: 40.00    Types: Cigarettes    Quit date: 05/08/1987    Years since quitting: 32.4  . Smokeless tobacco: Never Used  Substance and Sexual Activity  . Alcohol use: No    Alcohol/week: 0.0 standard drinks  . Drug use: No  . Sexual activity: Not Currently  Other Topics Concern  . Not on file  Social History Narrative  . Not on file   Social Determinants of Health   Financial Resource Strain:   . Difficulty of Paying Living Expenses:   Food Insecurity:   . Worried About Charity fundraiser in the Last Year:   . Arboriculturist in the Last Year:   Transportation Needs:   . Film/video editor (Medical):   [name] Lack of Transportation (Non-Medical):   Physical Activity:   . Days of Exercise per Week:   . Minutes of Exercise per Session:   Stress:   . Feeling of Stress :   Social Connections:   .  Frequency of Communication with Friends and Family:   . Frequency of Social Gatherings with Friends and Family:   . Attends Religious Services:   . Active Member of Clubs or Organizations:   . Attends Archivist Meetings:   [name] Marital Status:   Intimate Partner Violence:   . Fear of Current or Ex-Partner:   . Emotionally Abused:   [name] Physically Abused:   . Sexually Abused:     FAMILY HISTORY: Family History  Problem Relation Age of Onset  . Aneurysm Father        Brain aneurysm  . Lung cancer Mother        Small cell carcinoma of lung,kidney and heart  . Heart disease Mother   . Cancer Brother        lungs  . Diabetes Sister   .  Diabetes Sister   . Diabetes Brother   . Diabetes Brother     ALLERGIES:  has No Known Allergies.  MEDICATIONS:  Current Outpatient Medications  Medication Sig Dispense Refill  . ACCU-CHEK AVIVA PLUS test strip USE TO CHECK BLOOD SUGAR ONCE DAILY 100 each 3  . aspirin 81 MG tablet Take 81 mg by mouth daily.      [name] atorvastatin (LIPITOR) 40 MG tablet Take 1 tablet (40 mg total) by mouth daily. 90 tablet 1  . brimonidine (ALPHAGAN) 0.15 % ophthalmic solution Place 1 drop into both eyes 2 (two) times daily.    . Cholecalciferol (VITAMIN D3) 2000 UNITS TABS Take 1 tablet by mouth daily.    . fish oil-omega-3 fatty acids 1000 MG capsule Take 1 g by mouth 2 (two) times daily.      [name] gemfibrozil (LOPID) 600 MG tablet TAKE 1 TABLET BY MOUTH TWICE DAILY BEFORE MEAL(S) 180 tablet 1  . glipiZIDE (GLUCOTROL) 10 MG tablet TAKE 1 TABLET BY MOUTH TWICE DAILY BEFORE MEAL(S) 180 tablet 1  . isosorbide mononitrate (IMDUR) 30 MG 24 hr tablet Take 1 tablet (30 mg total) by mouth daily. New 01/30/2018 90 tablet 1  . lisinopril (ZESTRIL) 10 MG tablet Take 1 tablet (10 mg total) by mouth daily. 90 tablet 1  . metFORMIN (GLUCOPHAGE) 1000 MG tablet TAKE 1/2 (ONE-HALF) TABLET BY MOUTH IN THE MORNING AND 1 IN THE EVENING 180 tablet 1  . metoprolol tartrate (LOPRESSOR) 25 MG tablet Take 1 tablet (25 mg total) by mouth 2 (two) times daily. 180 tablet 1  . nitroGLYCERIN (NITROSTAT) 0.4 MG SL tablet Place 1 tablet (0.4 mg total) under the tongue every 5 (five) minutes x 3 doses as needed for chest pain (if no relief after 3rd dose, proceed to the ED for an evaluation). 25 tablet 1  . traZODone (DESYREL) 100 MG tablet Take 1 tablet (100 mg total) by mouth at bedtime. 90 tablet 1   No current facility-administered medications for this visit.    REVIEW OF SYSTEMS:   Review of Systems  Constitutional: Positive for fatigue (mild). Negative for appetite change, chills and fever.  Respiratory: Negative for cough.     Cardiovascular: Negative for chest pain.  Gastrointestinal: Positive for diarrhea. Negative for blood in stool.  Genitourinary: Negative for hematuria.   Neurological: Positive for dizziness. Negative for numbness.  Hematological: Bruises/bleeds easily (bruising).  Psychiatric/Behavioral: Positive for sleep disturbance.  All other systems reviewed and are negative.    PHYSICAL EXAMINATION: ECOG PERFORMANCE STATUS: 0 - Asymptomatic  Vitals:   10/14/19 1314  BP: 117/64  Pulse: 65  Resp: 16  Temp:  98.6 F (37 C)  SpO2: 99%   Filed Weights   10/14/19 1314  Weight: 158 lb (71.7 kg)   Physical Exam Vitals reviewed.  Constitutional:      Appearance: Normal appearance.  Cardiovascular:     Rate and Rhythm: Normal rate and regular rhythm.     Pulses: Normal pulses.     Heart sounds: Normal heart sounds.  Pulmonary:     Effort: Pulmonary effort is normal.     Breath sounds: Normal breath sounds.  Abdominal:     Palpations: Abdomen is soft. There is hepatomegaly. There is no splenomegaly or mass.     Tenderness: There is no abdominal tenderness.  Musculoskeletal:     Right lower leg: No edema.     Left lower leg: No edema.  Lymphadenopathy:     Cervical: No cervical adenopathy.     Upper Body:     Right upper body: No axillary adenopathy.     Left upper body: No axillary adenopathy.     Lower Body: No right inguinal adenopathy. No left inguinal adenopathy.  Neurological:     General: No focal deficit present.     Mental Status: He is alert and oriented to person, place, and time.  Psychiatric:        Mood and Affect: Mood normal.        Behavior: Behavior normal.      LABORATORY DATA:  I have reviewed the data as listed Recent Results (from the past 2160 hour(s))  Bayer DCA Hb A1c Waived     Status: None   Collection Time: 10/02/19  8:03 AM  Result Value Ref Range   HB A1C (BAYER DCA - WAIVED) 6.5 <7.0 %    Comment:                                        Diabetic Adult            <7.0                                       Healthy Adult        4.3 - 5.7                                                           (DCCT/NGSP) American Diabetes Association's Summary of Glycemic Recommendations for Adults with Diabetes: Hemoglobin A1c <7.0%. More stringent glycemic goals (A1c <6.0%) may further reduce complications at the cost of increased risk of hypoglycemia.   CBC with Differential/Platelet     Status: Abnormal   Collection Time: 10/02/19  8:03 AM  Result Value Ref Range   WBC 4.8 3.4 - 10.8 x10E3/uL   RBC 3.83 (L) 4.14 - 5.80 x10E6/uL    Comment: Few tear drops. Ovalocytes present.    Hemoglobin 13.1 13.0 - 17.7 g/dL   Hematocrit 38.3 37.5 - 51.0 %   MCV 100 (H) 79 - 97 fL   MCH 34.2 (H) 26.6 - 33.0 pg   MCHC 34.2 31.5 - 35.7 g/dL   RDW 13.2 11.6 - 15.4 %   Platelets 95 (LL) 150 -  450 x10E3/uL    Comment: Actual platelet count may be somewhat higher than reported due to aggregation of platelets in this sample.    Neutrophils 53 Not Estab. %   Lymphs 36 Not Estab. %   Monocytes 8 Not Estab. %   Eos 3 Not Estab. %   Basos 0 Not Estab. %   Neutrophils Absolute 2.5 1.4 - 7.0 x10E3/uL   Lymphocytes Absolute 1.7 0.7 - 3.1 x10E3/uL   Monocytes Absolute 0.4 0.1 - 0.9 x10E3/uL   EOS (ABSOLUTE) 0.1 0.0 - 0.4 x10E3/uL   Basophils Absolute 0.0 0.0 - 0.2 x10E3/uL   Immature Granulocytes 0 Not Estab. %   Immature Grans (Abs) 0.0 0.0 - 0.1 x10E3/uL   Hematology Comments: Note:     Comment: Verified by microscopic examination.  CMP14+EGFR     Status: Abnormal   Collection Time: 10/02/19  8:03 AM  Result Value Ref Range   Glucose 96 65 - 99 mg/dL   BUN 16 8 - 27 mg/dL   Creatinine, Ser 1.36 (H) 0.76 - 1.27 mg/dL   GFR calc non Af Amer 53 (L) >59 mL/min/1.73   GFR calc Af Amer 62 >59 mL/min/1.73    Comment: **[facility] currently reports eGFR in compliance with the current**   recommendations of the [employer]. [facility] will    update reporting as new guidelines are published from the NKF-ASN   Task force.    BUN/Creatinine Ratio 12 10 - 24   Sodium 140 134 - 144 mmol/L   Potassium 5.2 3.5 - 5.2 mmol/L   Chloride 107 (H) 96 - 106 mmol/L   CO2 17 (L) 20 - 29 mmol/L   Calcium 9.4 8.6 - 10.2 mg/dL   Total Protein 6.9 6.0 - 8.5 g/dL   Albumin 4.5 3.8 - 4.8 g/dL   Globulin, Total 2.4 1.5 - 4.5 g/dL   Albumin/Globulin Ratio 1.9 1.2 - 2.2   Bilirubin Total 0.5 0.0 - 1.2 mg/dL   Alkaline Phosphatase 58 48 - 121 IU/L    Comment:               **Please note reference interval change**   AST 32 0 - 40 IU/L   ALT 19 0 - 44 IU/L  Lipid panel     Status: Abnormal   Collection Time: 10/02/19  8:03 AM  Result Value Ref Range   Cholesterol, Total 119 100 - 199 mg/dL   Triglycerides 286 (H) 0 - 149 mg/dL   HDL 24 (L) >39 mg/dL   VLDL Cholesterol Cal 45 (H) 5 - 40 mg/dL   LDL Chol Calc (NIH) 50 0 - 99 mg/dL   Chol/HDL Ratio 5.0 0.0 - 5.0 ratio    Comment:                                   T. Chol/HDL Ratio                                             Men  Women                               1/2 Avg.Risk  3.4    3.3  Avg.Risk  5.0    4.4                                2X Avg.Risk  9.6    7.1                                3X Avg.Risk 23.4   11.0   CBC with Differential/Platelet     Status: Abnormal   Collection Time: 10/06/19  4:38 PM  Result Value Ref Range   WBC 6.1 3.4 - 10.8 x10E3/uL   RBC 3.67 (L) 4.14 - 5.80 x10E6/uL    Comment: Ovalocytes present.   Hemoglobin 12.2 (L) 13.0 - 17.7 g/dL   Hematocrit 36.8 (L) 37.5 - 51.0 %   MCV 100 (H) 79 - 97 fL   MCH 33.2 (H) 26.6 - 33.0 pg   MCHC 33.2 31.5 - 35.7 g/dL   RDW 13.2 11.6 - 15.4 %   Platelets 99 (LL) 150 - 450 x10E3/uL    Comment: Platelet count verified by examination of peripheral blood smear.   Neutrophils 48 Not Estab. %   Lymphs 40 Not Estab. %   Monocytes 8 Not Estab. %   Eos 3 Not Estab. %   Basos 1 Not Estab. %    Neutrophils Absolute 3.0 1.4 - 7.0 x10E3/uL   Lymphocytes Absolute 2.4 0.7 - 3.1 x10E3/uL   Monocytes Absolute 0.5 0.1 - 0.9 x10E3/uL   EOS (ABSOLUTE) 0.2 0.0 - 0.4 x10E3/uL   Basophils Absolute 0.0 0.0 - 0.2 x10E3/uL   Immature Granulocytes 0 Not Estab. %   Immature Grans (Abs) 0.0 0.0 - 0.1 x10E3/uL   Hematology Comments: Note:     Comment: Verified by microscopic examination.    RADIOGRAPHIC STUDIES: I have personally reviewed the radiological images as listed and agreed with the findings in the report.  ASSESSMENT:  1.  Moderate thrombocytopenia: -Platelet count 95 on 10/02/2019 and 99 on 10/06/2019. -MCV was elevated at 200.  Has easy bruising on the upper extremities but no active bleeding.  Platelet count prior to that 11 years ago was normal. -He does not have any B symptoms.  Denies any history of hepatitis or history of blood transfusion. -Physical exam reveals palpable liver edge.  No palpable splenomegaly.  2.  CKD: -He has CKD with creatinine ranging from 1.2-1.6. -Renal ultrasound on 02/07/2016 shows mild bilateral lobular renal contour.  Scarring cannot be excluded.  No mass or hydronephrosis.   PLAN:  1.  Moderate thrombocytopenia: -We will repeat his CBC and check his platelet count in blue top tube.  We will check LDH and evaluate for nutritional deficiencies.  We will also check for connective tissue disorders and infectious causes. -We will see him back in few weeks for follow-up.  2.  Hyperkalemia: -Review of labs in our office today showed potassium of 5.5. -I have called in Kayexalate 60 g today as a single dose and counseled him to decrease his consumption of potassium rich foods.   All questions were answered. The patient knows to call the clinic with any problems, questions or concerns.  Derek Jack, MD 10/14/19 1:16 PM  [place] [phone]   I, Milinda Antis, am acting as a scribe for Dr. Sanda Linger.  I,  Derek Jack MD, have reviewed the above documentation for accuracy and completeness, and I agree with  the above.

## 2019-10-15 ENCOUNTER — Encounter (HOSPITAL_COMMUNITY): Payer: Self-pay | Admitting: *Deleted

## 2019-10-15 LAB — PROTEIN ELECTROPHORESIS, SERUM
A/G Ratio: 1.2 (ref 0.7–1.7)
Albumin ELP: 3.7 g/dL (ref 2.9–4.4)
Alpha-1-Globulin: 0.2 g/dL (ref 0.0–0.4)
Alpha-2-Globulin: 0.9 g/dL (ref 0.4–1.0)
Beta Globulin: 1.1 g/dL (ref 0.7–1.3)
Gamma Globulin: 0.9 g/dL (ref 0.4–1.8)
Globulin, Total: 3.1 g/dL (ref 2.2–3.9)
Total Protein ELP: 6.8 g/dL (ref 6.0–8.5)

## 2019-10-15 LAB — RHEUMATOID FACTOR: Rheumatoid fact SerPl-aCnc: 12.2 IU/mL (ref 0.0–13.9)

## 2019-10-15 LAB — ANTINUCLEAR ANTIBODIES, IFA: ANA Ab, IFA: NEGATIVE

## 2019-10-15 NOTE — Progress Notes (Signed)
I spoke with patient's wife today per Dr. Delton Coombes and advised that his labs showed elevated potassium.  A prescription was sent to pharmacy for patient.  She verbalizes that she would go pick it up for him and understanding of instructions.

## 2019-10-16 LAB — H PYLORI, IGM, IGG, IGA AB
H Pylori IgG: 4.07 Index Value — ABNORMAL HIGH (ref 0.00–0.79)
H. Pylogi, Iga Abs: 16.6 units — ABNORMAL HIGH (ref 0.0–8.9)
H. Pylogi, Igm Abs: <9 units (ref 0.0–8.9)

## 2019-10-16 LAB — COPPER, SERUM: Copper: 62 ug/dL — ABNORMAL LOW (ref 69–132)

## 2019-10-16 LAB — PATHOLOGIST SMEAR REVIEW

## 2019-10-18 LAB — METHYLMALONIC ACID, SERUM: Methylmalonic Acid, Quantitative: 339 nmol/L (ref 0–378)

## 2019-11-17 ENCOUNTER — Inpatient Hospital Stay (HOSPITAL_COMMUNITY): Payer: Medicare Other | Attending: Hematology | Admitting: Hematology

## 2019-11-17 ENCOUNTER — Other Ambulatory Visit: Payer: Self-pay

## 2019-11-17 VITALS — BP 112/54 | HR 59 | Temp 97.1°F | Resp 18 | Wt 161.3 lb

## 2019-11-17 DIAGNOSIS — N189 Chronic kidney disease, unspecified: Secondary | ICD-10-CM | POA: Diagnosis not present

## 2019-11-17 DIAGNOSIS — A048 Other specified bacterial intestinal infections: Secondary | ICD-10-CM | POA: Diagnosis not present

## 2019-11-17 DIAGNOSIS — Z87891 Personal history of nicotine dependence: Secondary | ICD-10-CM | POA: Diagnosis not present

## 2019-11-17 DIAGNOSIS — D696 Thrombocytopenia, unspecified: Secondary | ICD-10-CM | POA: Diagnosis not present

## 2019-11-17 MED ORDER — CLARITHROMYCIN 500 MG PO TABS: 500.0000 mg | ORAL_TABLET | Freq: Two times a day (BID) | ORAL | 0 refills | Status: DC

## 2019-11-17 MED ORDER — AMOXICILLIN 500 MG PO TABS: 1000.0000 mg | ORAL_TABLET | Freq: Two times a day (BID) | ORAL | 0 refills | Status: DC

## 2019-11-17 MED ORDER — PANTOPRAZOLE SODIUM 40 MG PO TBEC: 40.0000 mg | DELAYED_RELEASE_TABLET | Freq: Every day | ORAL | 0 refills | Status: DC

## 2019-11-17 MED ORDER — METRONIDAZOLE 500 MG PO TABS: 500.0000 mg | ORAL_TABLET | Freq: Three times a day (TID) | ORAL | 0 refills | Status: DC

## 2019-11-17 NOTE — Progress Notes (Signed)
Edward [name], Edward [name]   CLINIC:  Medical Oncology/Hematology  PCP:  [name], [place] / [place] [id number]  [phone]  REASON FOR VISIT:  Follow-up for thrombocytopenia  PRIOR THERAPY: None  CURRENT THERAPY: Observation  INTERVAL HISTORY:  Edward [name], a 69 y.o. male, returns for routine follow-up for his thrombocytopenia. Edward [name] was last seen on 10/14/2019.  Today he is accompanied by his wife. He reports feeling well since his last visit. He has never been treated for H. Pylori. He used to take Tums for reflux, but has not taken any for over 1 year. He reports having diarrhea x 4 episodes the day that he took the potassium about 2 weeks ago, but it then resolved by the following day.  He is being followed by Dr. Theador Hawthorne for his kidneys, but he hasn't seen him for about 1 year. He has never taken iron tablets before.   REVIEW OF SYSTEMS:  Review of Systems  Constitutional: Positive for appetite change (mildly decreased) and fatigue (moderate).  Cardiovascular: Negative for leg swelling.  Gastrointestinal: Positive for diarrhea.  All other systems reviewed and are negative.   PAST MEDICAL/SURGICAL HISTORY:  Past Medical History:  Diagnosis Date  . Cataract   . CKD (chronic kidney disease) stage 3, GFR 30-59 ml/min   . Coronary atherosclerosis of native coronary artery    BMS LAD 2006; residual 75% distal RCA; EF 50%  . Essential hypertension   . Glaucoma   . Low back pain   . MI (myocardial infarction) ([facility])    Anterior 2006  . Mixed hyperlipidemia   . Renal insufficiency   . Type 2 diabetes mellitus ([facility])    Past Surgical History:  Procedure Laterality Date  . CATARACT EXTRACTION, BILATERAL    . CORONARY ANGIOPLASTY WITH STENT PLACEMENT  2006    SOCIAL HISTORY:  Social History   Socioeconomic History  . Marital status: Married    Spouse name: Not on file  . Number of  children: Not on file  . Years of education: 28th  . Highest education level: 7th grade  Occupational History  . Occupation: Retired  Tobacco Use  . Smoking status: Former Smoker    Packs/day: 2.00    Years: 20.00    Pack years: 40.00    Types: Cigarettes    Quit date: 05/08/1987    Years since quitting: 32.5  . Smokeless tobacco: Never Used  Vaping Use  . Vaping Use: Never used  Substance and Sexual Activity  . Alcohol use: No    Alcohol/week: 0.0 standard drinks  . Drug use: No  . Sexual activity: Not Currently  Other Topics Concern  . Not on file  Social History Narrative  . Not on file   Social Determinants of Health   Financial Resource Strain:   . Difficulty of Paying Living Expenses:   Food Insecurity:   . Worried About Charity fundraiser in the Last Year:   . Arboriculturist in the Last Year:   Transportation Needs:   . Film/video editor (Medical):   [name] Lack of Transportation (Non-Medical):   Physical Activity:   . Days of Exercise per Week:   . Minutes of Exercise per Session:   Stress:   . Feeling of Stress :   Social Connections:   . Frequency of Communication with Friends and Family:   . Frequency of Social Gatherings with Friends  and Family:   . Attends Religious Services:   . Active Member of Clubs or Organizations:   . Attends Archivist Meetings:   [name] Marital Status:   Intimate Partner Violence:   . Fear of Current or Ex-Partner:   . Emotionally Abused:   [name] Physically Abused:   . Sexually Abused:     FAMILY HISTORY:  Family History  Problem Relation Age of Onset  . Aneurysm Father        Brain aneurysm  . Lung cancer Mother        Small cell carcinoma of lung,kidney and heart  . Heart disease Mother   . Cancer Brother        lungs  . Diabetes Sister   . Diabetes Sister   . Diabetes Brother   . Diabetes Brother     CURRENT MEDICATIONS:  Current Outpatient Medications  Medication Sig Dispense Refill  . ACCU-CHEK AVIVA  PLUS test strip USE TO CHECK BLOOD SUGAR ONCE DAILY 100 each 3  . aspirin 81 MG tablet Take 81 mg by mouth daily.      [name] atorvastatin (LIPITOR) 40 MG tablet Take 1 tablet (40 mg total) by mouth daily. 90 tablet 1  . brimonidine (ALPHAGAN) 0.15 % ophthalmic solution Place 1 drop into both eyes 2 (two) times daily.    . Cholecalciferol (VITAMIN D3) 2000 UNITS TABS Take 1 tablet by mouth daily.    . fish oil-omega-3 fatty acids 1000 MG capsule Take 1 g by mouth 2 (two) times daily.      [name] gemfibrozil (LOPID) 600 MG tablet TAKE 1 TABLET BY MOUTH TWICE DAILY BEFORE MEAL(S) 180 tablet 1  . glipiZIDE (GLUCOTROL) 10 MG tablet TAKE 1 TABLET BY MOUTH TWICE DAILY BEFORE MEAL(S) 180 tablet 1  . isosorbide mononitrate (IMDUR) 30 MG 24 hr tablet Take 1 tablet (30 mg total) by mouth daily. New 01/30/2018 90 tablet 1  . lisinopril (ZESTRIL) 10 MG tablet Take 1 tablet (10 mg total) by mouth daily. 90 tablet 1  . metFORMIN (GLUCOPHAGE) 1000 MG tablet TAKE 1/2 (ONE-HALF) TABLET BY MOUTH IN THE MORNING AND 1 IN THE EVENING 180 tablet 1  . metoprolol tartrate (LOPRESSOR) 25 MG tablet Take 1 tablet (25 mg total) by mouth 2 (two) times daily. 180 tablet 1  . traZODone (DESYREL) 100 MG tablet Take 1 tablet (100 mg total) by mouth at bedtime. 90 tablet 1  . nitroGLYCERIN (NITROSTAT) 0.4 MG SL tablet Place 1 tablet (0.4 mg total) under the tongue every 5 (five) minutes x 3 doses as needed for chest pain (if no relief after 3rd dose, proceed to the ED for an evaluation). (Patient not taking: Reported on 11/17/2019) 25 tablet 1   No current facility-administered medications for this visit.    ALLERGIES:  No Known Allergies  PHYSICAL EXAM:  Performance status (ECOG): 0 - Asymptomatic  Vitals:   11/17/19 1413  BP: (!) 112/54  Pulse: (!) 59  Resp: 18  Temp: (!) 97.1 F (36.2 C)  SpO2: 99%   Wt Readings from Last 3 Encounters:  11/17/19 161 lb 4.8 oz (73.2 kg)  10/14/19 158 lb (71.7 kg)  10/09/19 160 lb 3.2 oz  (72.7 kg)   Physical Exam Vitals reviewed.  Constitutional:      Appearance: Normal appearance.  Cardiovascular:     Rate and Rhythm: Normal rate and regular rhythm.     Pulses: Normal pulses.     Heart sounds: Normal heart sounds.  Pulmonary:  Effort: Pulmonary effort is normal.     Breath sounds: Normal breath sounds.  Abdominal:     Palpations: Abdomen is soft. There is no mass.     Tenderness: There is no abdominal tenderness.     Hernia: No hernia is present.  Musculoskeletal:     Right lower leg: No edema.     Left lower leg: No edema.  Neurological:     General: No focal deficit present.     Mental Status: He is alert and oriented to person, place, and time.  Psychiatric:        Mood and Affect: Mood normal.        Behavior: Behavior normal.     LABORATORY DATA:  I have reviewed the labs as listed.  CBC Latest Ref Rng & Units 10/14/2019 10/06/2019 10/02/2019  WBC 4.0 - 10.5 K/uL 4.8 6.1 4.8  Hemoglobin 13.0 - 17.0 g/dL 12.1(L) 12.2(L) 13.1  Hematocrit 39 - 52 % 36.7(L) 36.8(L) 38.3  Platelets 150 - 400 K/uL 94(L) 99(LL) 95(LL)   CMP Latest Ref Rng & Units 10/14/2019 10/02/2019 03/20/2019  Glucose 70 - 99 mg/dL 123(H) 96 85  BUN 8 - 23 mg/dL 22 16 20   Creatinine 0.61 - 1.24 mg/dL 1.34(H) 1.36(H) 1.62(H)  Sodium 135 - 145 mmol/L 138 140 139  Potassium 3.5 - 5.1 mmol/L 5.5(H) 5.2 5.8(H)  Chloride 98 - 111 mmol/L 108 107(H) 103  CO2 22 - 32 mmol/L 21(L) 17(L) 17(L)  Calcium 8.9 - 10.3 mg/dL 9.1 9.4 9.7  Total Protein 6.5 - 8.1 g/dL 7.1 6.9 6.8  Total Bilirubin 0.3 - 1.2 mg/dL 0.9 0.5 0.4  Alkaline Phos 38 - 126 U/L 42 58 53  AST 15 - 41 U/L 36 32 38  ALT 0 - 44 U/L 25 19 24       Component Value Date/Time   RBC 3.62 (L) 10/14/2019 1432   MCV 101.4 (H) 10/14/2019 1432   MCV 100 (H) 10/06/2019 1638   MCH 33.4 10/14/2019 1432   MCHC 33.0 10/14/2019 1432   RDW 13.1 10/14/2019 1432   RDW 13.2 10/06/2019 1638   LYMPHSABS 1.4 10/14/2019 1432   LYMPHSABS 2.4  10/06/2019 1638   MONOABS 0.4 10/14/2019 1432   EOSABS 0.1 10/14/2019 1432   EOSABS 0.2 10/06/2019 1638   BASOSABS 0.0 10/14/2019 1432   BASOSABS 0.0 10/06/2019 1638   Lab Results  Component Value Date   TIBC 382 10/14/2019   FERRITIN 56 10/14/2019   IRONPCTSAT 22 10/14/2019   Lab Results  Component Value Date   LDH 137 10/14/2019    DIAGNOSTIC IMAGING:  I have independently reviewed the scans and discussed with the patient. No results found.   ASSESSMENT:  1.  Moderate thrombocytopenia: -Platelet count 95 on 10/02/2019 and 99 on 10/06/2019. -MCV was elevated at 200.  Has easy bruising on the upper extremities but no active bleeding.  Platelet count prior to that 11 years ago was normal. -He does not have any B symptoms.  Denies any history of hepatitis or history of blood transfusion. -Physical exam reveals palpable liver edge.  No palpable splenomegaly. -We reviewed labs from 10/14/2019 which showed platelet count 94.  2.  CKD: -He has CKD with creatinine ranging from 1.2-1.6. -Renal ultrasound on 02/07/2016 shows mild bilateral lobular renal contour.  Scarring cannot be excluded.  No mass or hydronephrosis.   PLAN:  1.  Moderate thrombocytopenia: -Labs from 10/14/2019 shows platelet count 94. -L93 and folic acid was normal.  Hepatitis panel  was normal.  ANA and rheumatoid factor was negative. -H. pylori IgA and IgG was positive. -We will give a trial of treatment for H. pylori with triple antibiotics clarithromycin, amoxicillin and Flagyl.  We will also give him Protonix. -We will reevaluate his counts in 4 weeks.  2.  Hyperkalemia: -This was treated.  He was told to follow-up with Dr. Theador Hawthorne.  3.  Iron deficiency state: -He has mild anemia with hemoglobin 12.1 in the setting of relative iron deficiency and CKD. -Ferritin is 56 and percent saturation is 22.  We will try oral iron therapy.  If no improvement will consider parenteral iron therapy.  Orders placed this  encounter:  No orders of the defined types were placed in this encounter.    Derek Jack, MD [place] [phone]   I, Milinda Antis, am acting as a scribe for Dr. Sanda Linger.  I, Derek Jack MD, have reviewed the above documentation for accuracy and completeness, and I agree with the above.

## 2019-11-17 NOTE — Patient Instructions (Signed)
[place] at [facility] Discharge Instructions  You were seen today by Dr. Delton Coombes. He went over your recent results. You were prescribed pantoprazole, metronidazole, clarithromycin, and amoxicillin to treat the H. pylori in your stomach; take them for 2 weeks. If you get diarrhea, buy Imodium over the counter and take as needed. Dr. Delton Coombes will see you back in 1 month for labs and follow up.   Thank you for choosing [place] at [facility] to provide your oncology and hematology care.  To afford each patient quality time with our provider, please arrive at least 15 minutes before your scheduled appointment time.   If you have a lab appointment with the [facility] please come in thru the Main Entrance and check in at the main information desk  You need to re-schedule your appointment should you arrive 10 or more minutes late.  We strive to give you quality time with our providers, and arriving late affects you and other patients whose appointments are after yours.  Also, if you no show three or more times for appointments you may be dismissed from the clinic at the providers discretion.     Again, thank you for choosing [facility].  Our hope is that these requests will decrease the amount of time that you wait before being seen by our physicians.       _____________________________________________________________  Should you have questions after your visit to [facility], please contact our office at [phone] between the hours of 8:00 a.m. and 4:30 p.m.  Voicemails left after 4:00 p.m. will not be returned until the following business day.  For prescription refill requests, have your pharmacy contact our office and allow 72 hours.    Cancer Center Support Programs:   > Cancer Support Group  2nd Tuesday of the month 1pm-2pm, Journey Room

## 2019-12-15 ENCOUNTER — Other Ambulatory Visit: Payer: Self-pay

## 2019-12-15 ENCOUNTER — Inpatient Hospital Stay (HOSPITAL_COMMUNITY): Payer: Medicare Other | Attending: Hematology

## 2019-12-15 DIAGNOSIS — D696 Thrombocytopenia, unspecified: Secondary | ICD-10-CM | POA: Diagnosis not present

## 2019-12-15 LAB — CBC WITH DIFFERENTIAL/PLATELET
Abs Immature Granulocytes: 0.01 10*3/uL (ref 0.00–0.07)
Basophils Absolute: 0 10*3/uL (ref 0.0–0.1)
Basophils Relative: 1 %
Eosinophils Absolute: 0.2 10*3/uL (ref 0.0–0.5)
Eosinophils Relative: 4 %
HCT: 36.1 % — ABNORMAL LOW (ref 39.0–52.0)
Hemoglobin: 11.7 g/dL — ABNORMAL LOW (ref 13.0–17.0)
Immature Granulocytes: 0 %
Lymphocytes Relative: 30 %
Lymphs Abs: 1.2 10*3/uL (ref 0.7–4.0)
MCH: 33.5 pg (ref 26.0–34.0)
MCHC: 32.4 g/dL (ref 30.0–36.0)
MCV: 103.4 fL — ABNORMAL HIGH (ref 80.0–100.0)
Monocytes Absolute: 0.3 10*3/uL (ref 0.1–1.0)
Monocytes Relative: 8 %
Neutro Abs: 2.2 10*3/uL (ref 1.7–7.7)
Neutrophils Relative %: 57 %
Platelets: 92 10*3/uL — ABNORMAL LOW (ref 150–400)
RBC: 3.49 MIL/uL — ABNORMAL LOW (ref 4.22–5.81)
RDW: 13.2 % (ref 11.5–15.5)
WBC: 3.9 10*3/uL — ABNORMAL LOW (ref 4.0–10.5)
nRBC: 0 % (ref 0.0–0.2)

## 2019-12-15 LAB — PLATELET BY CITRATE

## 2019-12-22 ENCOUNTER — Inpatient Hospital Stay (HOSPITAL_COMMUNITY): Payer: Medicare Other | Attending: Hematology | Admitting: Hematology

## 2019-12-22 VITALS — BP 117/71 | HR 72 | Temp 97.8°F | Resp 18 | Wt 155.8 lb

## 2019-12-22 DIAGNOSIS — D696 Thrombocytopenia, unspecified: Secondary | ICD-10-CM | POA: Insufficient documentation

## 2019-12-22 DIAGNOSIS — I252 Old myocardial infarction: Secondary | ICD-10-CM | POA: Insufficient documentation

## 2019-12-22 DIAGNOSIS — I10 Essential (primary) hypertension: Secondary | ICD-10-CM | POA: Diagnosis not present

## 2019-12-22 DIAGNOSIS — N183 Chronic kidney disease, stage 3 unspecified: Secondary | ICD-10-CM | POA: Insufficient documentation

## 2019-12-22 DIAGNOSIS — E782 Mixed hyperlipidemia: Secondary | ICD-10-CM | POA: Diagnosis not present

## 2019-12-22 DIAGNOSIS — E119 Type 2 diabetes mellitus without complications: Secondary | ICD-10-CM | POA: Diagnosis not present

## 2019-12-22 DIAGNOSIS — Z7982 Long term (current) use of aspirin: Secondary | ICD-10-CM | POA: Insufficient documentation

## 2019-12-22 DIAGNOSIS — I2510 Atherosclerotic heart disease of native coronary artery without angina pectoris: Secondary | ICD-10-CM | POA: Diagnosis not present

## 2019-12-22 DIAGNOSIS — D509 Iron deficiency anemia, unspecified: Secondary | ICD-10-CM | POA: Diagnosis not present

## 2019-12-22 DIAGNOSIS — Z7984 Long term (current) use of oral hypoglycemic drugs: Secondary | ICD-10-CM | POA: Insufficient documentation

## 2019-12-22 DIAGNOSIS — Z87891 Personal history of nicotine dependence: Secondary | ICD-10-CM | POA: Diagnosis not present

## 2019-12-22 DIAGNOSIS — Z79899 Other long term (current) drug therapy: Secondary | ICD-10-CM | POA: Diagnosis not present

## 2019-12-22 NOTE — Progress Notes (Signed)
Edward [name], [place] [id number]   CLINIC:  Medical Oncology/Hematology  PCP:  [name], [place] / [place] [address]  [phone]  REASON FOR VISIT:  Follow-up for thrombocytopenia  PRIOR THERAPY: None  CURRENT THERAPY: Observation  INTERVAL HISTORY:  Edward [name], a 69 y.o. male, returns for routine follow-up for his thrombocytopenia. Edward [name] was last seen on 10/14/2019.  Today he is accompanied by his wife. He reports getting easy bruising especially after phlebotomy. He is not taking iron tablets currently. He denies having any dyspepsia, F/C, night sweats or weight loss.   REVIEW OF SYSTEMS:  Review of Systems  Constitutional: Positive for appetite change (depleted) and fatigue (severe). Negative for chills, diaphoresis, fever and unexpected weight change.  Gastrointestinal: Positive for abdominal pain (2/10 lower abdominal pain) and nausea.  Neurological: Positive for dizziness.  All other systems reviewed and are negative.   PAST MEDICAL/SURGICAL HISTORY:  Past Medical History:  Diagnosis Date   Cataract    CKD (chronic kidney disease) stage 3, GFR 30-59 ml/min    Coronary atherosclerosis of native coronary artery    BMS LAD 2006; residual 75% distal RCA; EF 50%   Essential hypertension    Glaucoma    Low back pain    MI (myocardial infarction) ([place])    Anterior 2006   Mixed hyperlipidemia    Renal insufficiency    Type 2 diabetes mellitus ([place])    Past Surgical History:  Procedure Laterality Date   CATARACT EXTRACTION, BILATERAL     CORONARY ANGIOPLASTY WITH STENT PLACEMENT  2006    SOCIAL HISTORY:  Social History   Socioeconomic History   Marital status: Married    Spouse name: Not on file   Number of children: Not on file   Years of education: 7th   Highest education level: 7th grade  Occupational History   Occupation: Retired  Tobacco Use   Smoking  status: Former Smoker    Packs/day: 2.00    Years: 20.00    Pack years: 40.00    Types: Cigarettes    Quit date: 05/08/1987    Years since quitting: 32.6   Smokeless tobacco: Never Used  Vaping Use   Vaping Use: Never used  Substance and Sexual Activity   Alcohol use: No    Alcohol/week: 0.0 standard drinks   Drug use: No   Sexual activity: Not Currently  Other Topics Concern   Not on file  Social History Narrative   Not on file   Social Determinants of Health   Financial Resource Strain:    Difficulty of Paying Living Expenses:   Food Insecurity:    Worried About Charity fundraiser in the Last Year:    Arboriculturist in the Last Year:   Transportation Needs:    Film/video editor (Medical):    Lack of Transportation (Non-Medical):   Physical Activity:    Days of Exercise per Week:    Minutes of Exercise per Session:   Stress:    Feeling of Stress :   Social Connections:    Frequency of Communication with Friends and Family:    Frequency of Social Gatherings with Friends and Family:    Attends Religious Services:    Active Member of Clubs or Organizations:    Attends Archivist Meetings:    Marital Status:   Intimate Partner Violence:    Fear of Current or Ex-Partner:  Emotionally Abused:    Physically Abused:    Sexually Abused:     FAMILY HISTORY:  Family History  Problem Relation Age of Onset   Aneurysm Father        Brain aneurysm   Lung cancer Mother        Small cell carcinoma of lung,kidney and heart   Heart disease Mother    Cancer Brother        lungs   Diabetes Sister    Diabetes Sister    Diabetes Brother    Diabetes Brother     CURRENT MEDICATIONS:  Current Outpatient Medications  Medication Sig Dispense Refill   ACCU-CHEK AVIVA PLUS test strip USE TO CHECK BLOOD SUGAR ONCE DAILY 100 each 3   amoxicillin (AMOXIL) 500 MG tablet Take 2 tablets (1,000 mg total) by mouth 2 (two) times  daily. 56 tablet 0   aspirin 81 MG tablet Take 81 mg by mouth daily.       atorvastatin (LIPITOR) 40 MG tablet Take 1 tablet (40 mg total) by mouth daily. 90 tablet 1   brimonidine (ALPHAGAN) 0.15 % ophthalmic solution Place 1 drop into both eyes 2 (two) times daily.     Cholecalciferol (VITAMIN D3) 2000 UNITS TABS Take 1 tablet by mouth daily.     clarithromycin (BIAXIN) 500 MG tablet Take 1 tablet (500 mg total) by mouth 2 (two) times daily. 28 tablet 0   fish oil-omega-3 fatty acids 1000 MG capsule Take 1 g by mouth 2 (two) times daily.       gemfibrozil (LOPID) 600 MG tablet TAKE 1 TABLET BY MOUTH TWICE DAILY BEFORE MEAL(S) 180 tablet 1   glipiZIDE (GLUCOTROL) 10 MG tablet TAKE 1 TABLET BY MOUTH TWICE DAILY BEFORE MEAL(S) 180 tablet 1   isosorbide mononitrate (IMDUR) 30 MG 24 hr tablet Take 1 tablet (30 mg total) by mouth daily. New 01/30/2018 90 tablet 1   lisinopril (ZESTRIL) 10 MG tablet Take 1 tablet (10 mg total) by mouth daily. 90 tablet 1   metFORMIN (GLUCOPHAGE) 1000 MG tablet TAKE 1/2 (ONE-HALF) TABLET BY MOUTH IN THE MORNING AND 1 IN THE EVENING 180 tablet 1   metoprolol tartrate (LOPRESSOR) 25 MG tablet Take 1 tablet (25 mg total) by mouth 2 (two) times daily. 180 tablet 1   metroNIDAZOLE (FLAGYL) 500 MG tablet Take 1 tablet (500 mg total) by mouth 3 (three) times daily. 42 tablet 0   nitroGLYCERIN (NITROSTAT) 0.4 MG SL tablet Place 1 tablet (0.4 mg total) under the tongue every 5 (five) minutes x 3 doses as needed for chest pain (if no relief after 3rd dose, proceed to the ED for an evaluation). 25 tablet 1   pantoprazole (PROTONIX) 40 MG tablet Take 1 tablet (40 mg total) by mouth daily. 14 tablet 0   SPS 15 GM/60ML suspension SMARTSIG:240 Milliliter(s) By Mouth Once     traZODone (DESYREL) 100 MG tablet Take 1 tablet (100 mg total) by mouth at bedtime. 90 tablet 1   No current facility-administered medications for this visit.    ALLERGIES:  No Known  Allergies  PHYSICAL EXAM:  Performance status (ECOG): 0 - Asymptomatic  Vitals:   12/22/19 1548  BP: 117/71  Pulse: 72  Resp: 18  Temp: 97.8 F (36.6 C)  SpO2: 99%   Wt Readings from Last 3 Encounters:  12/22/19 155 lb 12.8 oz (70.7 kg)  11/17/19 161 lb 4.8 oz (73.2 kg)  10/14/19 158 lb (71.7 kg)   Physical Exam Vitals  reviewed.  Constitutional:      Appearance: Normal appearance.  Cardiovascular:     Rate and Rhythm: Normal rate and regular rhythm.     Pulses: Normal pulses.     Heart sounds: Normal heart sounds.  Pulmonary:     Effort: Pulmonary effort is normal.     Breath sounds: Normal breath sounds.  Abdominal:     Palpations: Abdomen is soft.     Tenderness: There is no abdominal tenderness.  Neurological:     General: No focal deficit present.     Mental Status: He is alert and oriented to person, place, and time.  Psychiatric:        Mood and Affect: Mood normal.        Behavior: Behavior normal.     LABORATORY DATA:  I have reviewed the labs as listed.  CBC Latest Ref Rng & Units 12/15/2019 10/14/2019 10/06/2019  WBC 4.0 - 10.5 K/uL 3.9(L) 4.8 6.1  Hemoglobin 13.0 - 17.0 g/dL 11.7(L) 12.1(L) 12.2(L)  Hematocrit 39 - 52 % 36.1(L) 36.7(L) 36.8(L)  Platelets 150 - 400 K/uL 92(L) 94(L) 99(LL)   CMP Latest Ref Rng & Units 10/14/2019 10/02/2019 03/20/2019  Glucose 70 - 99 mg/dL 123(H) 96 85  BUN 8 - 23 mg/dL 22 16 20   Creatinine 0.61 - 1.24 mg/dL 1.34(H) 1.36(H) 1.62(H)  Sodium 135 - 145 mmol/L 138 140 139  Potassium 3.5 - 5.1 mmol/L 5.5(H) 5.2 5.8(H)  Chloride 98 - 111 mmol/L 108 107(H) 103  CO2 22 - 32 mmol/L 21(L) 17(L) 17(L)  Calcium 8.9 - 10.3 mg/dL 9.1 9.4 9.7  Total Protein 6.5 - 8.1 g/dL 7.1 6.9 6.8  Total Bilirubin 0.3 - 1.2 mg/dL 0.9 0.5 0.4  Alkaline Phos 38 - 126 U/L 42 58 53  AST 15 - 41 U/L 36 32 38  ALT 0 - 44 U/L 25 19 24       Component Value Date/Time   RBC 3.49 (L) 12/15/2019 1305   MCV 103.4 (H) 12/15/2019 1305   MCV 100 (H)  10/06/2019 1638   MCH 33.5 12/15/2019 1305   MCHC 32.4 12/15/2019 1305   RDW 13.2 12/15/2019 1305   RDW 13.2 10/06/2019 1638   LYMPHSABS 1.2 12/15/2019 1305   LYMPHSABS 2.4 10/06/2019 1638   MONOABS 0.3 12/15/2019 1305   EOSABS 0.2 12/15/2019 1305   EOSABS 0.2 10/06/2019 1638   BASOSABS 0.0 12/15/2019 1305   BASOSABS 0.0 10/06/2019 1638   Lab Results  Component Value Date   TIBC 382 10/14/2019   FERRITIN 56 10/14/2019   IRONPCTSAT 22 10/14/2019    DIAGNOSTIC IMAGING:  I have independently reviewed the scans and discussed with the patient. No results found.   ASSESSMENT:  1.  Moderate thrombocytopenia: -Platelet count 95 on 10/02/2019 and 99 on 10/06/2019. -MCV was elevated at 200.  Has easy bruising on the upper extremities but no active bleeding.  Platelet count prior to that 11 years ago was normal. -He does not have any B symptoms.  Denies any history of hepatitis or history of blood transfusion. -Physical exam reveals palpable liver edge.  No palpable splenomegaly.  2.  CKD: -He has CKD with creatinine ranging from 1.2-1.6. -Renal ultrasound on 02/07/2016 shows mild bilateral lobular renal contour.  Scarring cannot be excluded.  No mass or hydronephrosis.   PLAN:  1.  Moderate thrombocytopenia: -Platelet count stable at 92.  It was confirmed on EDTA tube. -He does not have any major bleeding episodes.  2.  Macrocytic anemia: -CBC on 12/15/2019 shows hemoglobin 11.7 and MCV of 103.4. -On 10/14/2019 ferritin was 56 with percent saturation of 22.  K16 and folic acid were normal. -He does have CKD.  Hence I have told him to start taking iron tablet daily.  He was told to take stool softener also. -We will reevaluate his labs in 3 months.  If there is no improvement, will consider parenteral iron.  Orders placed this encounter:  No orders of the defined types were placed in this encounter.    Derek Jack, MD [place] [phone]   I,  Milinda Antis, am acting as a scribe for Dr. Sanda Linger.  I, Derek Jack MD, have reviewed the above documentation for accuracy and completeness, and I agree with the above.

## 2019-12-22 NOTE — Patient Instructions (Signed)
[place] at [facility] Discharge Instructions  You were seen today by Dr. Delton Coombes. He went over your recent results. Purchase iron tablets (ferrous sulfate 325 mg) over the counter and take 1 tablet daily; purchase stool softener, like Colace, and take daily to prevent constipation. Dr. Delton Coombes will see you back in 3 months for labs and follow up.   Thank you for choosing [place] at [facility] to provide your oncology and hematology care.  To afford each patient quality time with our provider, please arrive at least 15 minutes before your scheduled appointment time.   If you have a lab appointment with the [place] please come in thru the Main Entrance and check in at the main information desk  You need to re-schedule your appointment should you arrive 10 or more minutes late.  We strive to give you quality time with our providers, and arriving late affects you and other patients whose appointments are after yours.  Also, if you no show three or more times for appointments you may be dismissed from the clinic at the providers discretion.     Again, thank you for choosing [facility].  Our hope is that these requests will decrease the amount of time that you wait before being seen by our physicians.       _____________________________________________________________  Should you have questions after your visit to [facility], please contact our office at [phone] between the hours of 8:00 a.m. and 4:30 p.m.  Voicemails left after 4:00 p.m. will not be returned until the following business day.  For prescription refill requests, have your pharmacy contact our office and allow 72 hours.    Cancer Center Support Programs:   > Cancer Support Group  2nd Tuesday of the month 1pm-2pm, Journey Room

## 2020-01-15 ENCOUNTER — Encounter: Payer: Self-pay | Admitting: Nurse Practitioner

## 2020-01-15 ENCOUNTER — Ambulatory Visit (INDEPENDENT_AMBULATORY_CARE_PROVIDER_SITE_OTHER): Payer: Medicare Other | Admitting: Nurse Practitioner

## 2020-01-15 ENCOUNTER — Other Ambulatory Visit: Payer: Self-pay

## 2020-01-15 VITALS — BP 98/61 | HR 71 | Temp 97.5°F | Resp 20 | Ht 70.0 in | Wt 156.0 lb

## 2020-01-15 DIAGNOSIS — N1832 Chronic kidney disease, stage 3b: Secondary | ICD-10-CM | POA: Diagnosis not present

## 2020-01-15 DIAGNOSIS — E1121 Type 2 diabetes mellitus with diabetic nephropathy: Secondary | ICD-10-CM

## 2020-01-15 DIAGNOSIS — E782 Mixed hyperlipidemia: Secondary | ICD-10-CM

## 2020-01-15 DIAGNOSIS — I25119 Atherosclerotic heart disease of native coronary artery with unspecified angina pectoris: Secondary | ICD-10-CM

## 2020-01-15 DIAGNOSIS — I10 Essential (primary) hypertension: Secondary | ICD-10-CM | POA: Diagnosis not present

## 2020-01-15 DIAGNOSIS — F5101 Primary insomnia: Secondary | ICD-10-CM

## 2020-01-15 DIAGNOSIS — E1122 Type 2 diabetes mellitus with diabetic chronic kidney disease: Secondary | ICD-10-CM

## 2020-01-15 LAB — BAYER DCA HB A1C WAIVED: HB A1C (BAYER DCA - WAIVED): 6.5 % (ref <7.0)

## 2020-01-15 MED ORDER — ATORVASTATIN CALCIUM 40 MG PO TABS: 40.0000 mg | ORAL_TABLET | Freq: Every day | ORAL | 1 refills | Status: DC

## 2020-01-15 MED ORDER — ISOSORBIDE MONONITRATE ER 30 MG PO TB24: 30.0000 mg | ORAL_TABLET | Freq: Every day | ORAL | 1 refills | Status: DC

## 2020-01-15 MED ORDER — GEMFIBROZIL 600 MG PO TABS: ORAL_TABLET | ORAL | 1 refills | Status: DC

## 2020-01-15 MED ORDER — GLIPIZIDE 10 MG PO TABS: ORAL_TABLET | ORAL | 1 refills | Status: DC

## 2020-01-15 MED ORDER — LISINOPRIL 10 MG PO TABS: 10.0000 mg | ORAL_TABLET | Freq: Every day | ORAL | 1 refills | Status: DC

## 2020-01-15 MED ORDER — METFORMIN HCL 1000 MG PO TABS: 500.0000 mg | ORAL_TABLET | Freq: Two times a day (BID) | ORAL | 1 refills | Status: DC

## 2020-01-15 MED ORDER — METFORMIN HCL 1000 MG PO TABS: ORAL_TABLET | ORAL | 1 refills | Status: DC

## 2020-01-15 MED ORDER — TRAZODONE HCL 100 MG PO TABS: 100.0000 mg | ORAL_TABLET | Freq: Every day | ORAL | 1 refills | Status: DC

## 2020-01-15 MED ORDER — METOPROLOL TARTRATE 25 MG PO TABS: 25.0000 mg | ORAL_TABLET | Freq: Two times a day (BID) | ORAL | 1 refills | Status: DC

## 2020-01-15 NOTE — Progress Notes (Signed)
Subjective:    Patient ID: Edward [name], male    DOB: 04/25/1951, 69 y.o.   MRN: [id number]   Chief Complaint: medical management of chronic issues     HPI:  1. Essential hypertension BP Readings from Last 3 Encounters:  01/15/20 98/61  12/22/19 117/71  11/17/19 (!) 112/54  Takes blood pressure at home irregularly. Takes medication as prescribed. Denies chest pain, SOB, HA, or swelling. Does not limit salt intake.    2. Type 2 diabetes mellitus with stage 3b chronic kidney disease, without long-term current use of insulin ([place]) Lab Results  Component Value Date   HGBA1C 6.5 10/02/2019  Checks CBG at home irregularly. Takes medication as prescribed. Does not watch carb or sugar intake. Only checks blood sugars occasionally. HA1C is 6.5% today.    3. Mixed hyperlipidemia Lab Results  Component Value Date   CHOL 119 10/02/2019   HDL 24 (L) 10/02/2019   LDLCALC 50 10/02/2019   TRIG 286 (H) 10/02/2019   CHOLHDL 5.0 10/02/2019  Takes medication as prescribed. Does not limit fried or fast food. Walks for exercise, house work.     Outpatient Encounter Medications as of 01/15/2020  Medication Sig  . ACCU-CHEK AVIVA PLUS test strip USE TO CHECK BLOOD SUGAR ONCE DAILY  . amoxicillin (AMOXIL) 500 MG tablet Take 2 tablets (1,000 mg total) by mouth 2 (two) times daily.  [name] aspirin 81 MG tablet Take 81 mg by mouth daily.    [name] atorvastatin (LIPITOR) 40 MG tablet Take 1 tablet (40 mg total) by mouth daily.  . brimonidine (ALPHAGAN) 0.15 % ophthalmic solution Place 1 drop into both eyes 2 (two) times daily.  . Cholecalciferol (VITAMIN D3) 2000 UNITS TABS Take 1 tablet by mouth daily.  . clarithromycin (BIAXIN) 500 MG tablet Take 1 tablet (500 mg total) by mouth 2 (two) times daily.  . fish oil-omega-3 fatty acids 1000 MG capsule Take 1 g by mouth 2 (two) times daily.    [name] gemfibrozil (LOPID) 600 MG tablet TAKE 1 TABLET BY MOUTH TWICE DAILY BEFORE MEAL(S)  . glipiZIDE (GLUCOTROL) 10 MG  tablet TAKE 1 TABLET BY MOUTH TWICE DAILY BEFORE MEAL(S)  . isosorbide mononitrate (IMDUR) 30 MG 24 hr tablet Take 1 tablet (30 mg total) by mouth daily. New 01/30/2018  . lisinopril (ZESTRIL) 10 MG tablet Take 1 tablet (10 mg total) by mouth daily.  . metFORMIN (GLUCOPHAGE) 1000 MG tablet TAKE 1/2 (ONE-HALF) TABLET BY MOUTH IN THE MORNING AND 1 IN THE EVENING  . metoprolol tartrate (LOPRESSOR) 25 MG tablet Take 1 tablet (25 mg total) by mouth 2 (two) times daily.  . metroNIDAZOLE (FLAGYL) 500 MG tablet Take 1 tablet (500 mg total) by mouth 3 (three) times daily.  . nitroGLYCERIN (NITROSTAT) 0.4 MG SL tablet Place 1 tablet (0.4 mg total) under the tongue every 5 (five) minutes x 3 doses as needed for chest pain (if no relief after 3rd dose, proceed to the ED for an evaluation).  . pantoprazole (PROTONIX) 40 MG tablet Take 1 tablet (40 mg total) by mouth daily.  . SPS 15 GM/60ML suspension SMARTSIG:240 Milliliter(s) By Mouth Once  . traZODone (DESYREL) 100 MG tablet Take 1 tablet (100 mg total) by mouth at bedtime.     Past Surgical History:  Procedure Laterality Date  . CATARACT EXTRACTION, BILATERAL    . CORONARY ANGIOPLASTY WITH STENT PLACEMENT  2006    Family History  Problem Relation Age of Onset  . Aneurysm Father  Brain aneurysm  . Lung cancer Mother        Small cell carcinoma of lung,kidney and heart  . Heart disease Mother   . Cancer Brother        lungs  . Diabetes Sister   . Diabetes Sister   . Diabetes Brother   . Diabetes Brother     New complaints: No new complaints today.   Social history: Lives alone, has siblings that live close by. Watches TV.   Controlled substance contract: n/a   Review of Systems  Constitutional: Negative.   HENT: Negative.   Eyes: Negative.   Respiratory: Negative.   Cardiovascular: Negative.   Gastrointestinal: Negative.   Endocrine: Negative.   Genitourinary: Negative.   Musculoskeletal: Negative.   Skin: Negative.     Allergic/Immunologic: Negative.   Neurological: Negative.   Hematological: Negative.   Psychiatric/Behavioral: Negative.   All other systems reviewed and are negative.      Objective:   Physical Exam Vitals and nursing note reviewed.  Constitutional:      Appearance: Normal appearance.  HENT:     Head: Normocephalic and atraumatic.     Right Ear: Tympanic membrane, ear canal and external ear normal.     Left Ear: Tympanic membrane, ear canal and external ear normal.     Nose: Nose normal.     Mouth/Throat:     Mouth: Mucous membranes are moist.     Pharynx: Oropharynx is clear.  Eyes:     Extraocular Movements: Extraocular movements intact.     Conjunctiva/sclera: Conjunctivae normal.     Pupils: Pupils are equal, round, and reactive to light.  Cardiovascular:     Rate and Rhythm: Normal rate and regular rhythm.     Pulses: Normal pulses.     Heart sounds: Normal heart sounds.  Pulmonary:     Effort: Pulmonary effort is normal.     Breath sounds: Normal breath sounds.  Abdominal:     General: Abdomen is flat.     Palpations: Abdomen is soft.  Musculoskeletal:        General: Normal range of motion.     Cervical back: Normal range of motion.  Skin:    General: Skin is warm and dry.     Capillary Refill: Capillary refill takes less than 2 seconds.  Neurological:     General: No focal deficit present.     Mental Status: He is alert and oriented to person, place, and time. Mental status is at baseline.  Psychiatric:        Mood and Affect: Mood normal.        Behavior: Behavior normal.        Thought Content: Thought content normal.        Judgment: Judgment normal.    BP 98/61   Pulse 71   Temp (!) 97.5 F (36.4 C) (Temporal)   Resp 20   Ht _0  (1.778 m)   Wt 156 lb (70.8 kg)   SpO2 100%   BMI 22.38 kg/m     hgba1c 6.5% Assessment & Plan:  Edward [name] comes in today with chief complaint of No chief complaint on file.   Diagnosis and orders  addressed:  1. Essential hypertension Check blood pressure regularly and take medication as prescribed. Eat a heart healthy diet and avoid high salt intake.   - CBC with Differential/Platelet - CMP14+EGFR  2. Type 2 diabetes mellitus with stage 3b chronic kidney disease, without long-term current use  of insulin ([place]) Check blood pressure regularly and take medication as prescribed. Avoid foods that are high in carbs and sugar.  Change metformin to 1/2 tablet BID.  - Bayer DCA Hb A1c Waived  3. Mixed hyperlipidemia Avoid high fat and fried foods. Take medication as prescribed. Stay active, walking and swimming are great cardiovascular exercises that help lower blood pressure, blood glucose level, and lipid levels.     - Lipid panel  Meds ordered this encounter  Medications  . metoprolol tartrate (LOPRESSOR) 25 MG tablet    Sig: Take 1 tablet (25 mg total) by mouth 2 (two) times daily.    Dispense:  180 tablet    Refill:  1    Order Specific Question:   Supervising Provider    Answer:   Caryl Pina A [id number]  . isosorbide mononitrate (IMDUR) 30 MG 24 hr tablet    Sig: Take 1 tablet (30 mg total) by mouth daily. New 01/30/2018    Dispense:  90 tablet    Refill:  1    DOSE INCREASE 03/17/18    Order Specific Question:   Supervising Provider    Answer:   Caryl Pina A [[id number]]  . traZODone (DESYREL) 100 MG tablet    Sig: Take 1 tablet (100 mg total) by mouth at bedtime.    Dispense:  90 tablet    Refill:  1    Order Specific Question:   Supervising Provider    Answer:   Caryl Pina A [id number]  . atorvastatin (LIPITOR) 40 MG tablet    Sig: Take 1 tablet (40 mg total) by mouth daily.    Dispense:  90 tablet    Refill:  1    Order Specific Question:   Supervising Provider    Answer:   Caryl Pina A [id number]  . gemfibrozil (LOPID) 600 MG tablet    Sig: TAKE 1 TABLET BY MOUTH TWICE DAILY BEFORE MEAL(S)    Dispense:  180 tablet    Refill:  1    Order  Specific Question:   Supervising Provider    Answer:   Caryl Pina A [id number]  . lisinopril (ZESTRIL) 10 MG tablet    Sig: Take 1 tablet (10 mg total) by mouth daily.    Dispense:  90 tablet    Refill:  1    Order Specific Question:   Supervising Provider    Answer:   Caryl Pina A [id number]  . metFORMIN (GLUCOPHAGE) 1000 MG tablet    Sig: TAKE 1/2 (ONE-HALF) TABLET BY MOUTH IN THE MORNING AND 1 IN THE EVENING    Dispense:  180 tablet    Refill:  1    Order Specific Question:   Supervising Provider    Answer:   Caryl Pina A [id number]  . glipiZIDE (GLUCOTROL) 10 MG tablet    Sig: TAKE 1 TABLET BY MOUTH TWICE DAILY BEFORE MEAL(S)    Dispense:  180 tablet    Refill:  1    Order Specific Question:   Supervising Provider    Answer:   Caryl Pina A [id number]    Labs pending Health Maintenance reviewed- patient wil schedule an eye exam Diet and exercise encouraged  Follow up plan: Follow up in 3 months.    Mary-Margaret Hassell Done, FNP

## 2020-01-15 NOTE — Patient Instructions (Signed)
DASH Eating Plan DASH stands for "Dietary Approaches to Stop Hypertension." The DASH eating plan is a healthy eating plan that has been shown to reduce high blood pressure (hypertension). It may also reduce your risk for type 2 diabetes, heart disease, and stroke. The DASH eating plan may also help with weight loss. What are tips for following this plan?  General guidelines  Avoid eating more than 2,300 mg (milligrams) of salt (sodium) a day. If you have hypertension, you may need to reduce your sodium intake to 1,500 mg a day.  Limit alcohol intake to no more than 1 drink a day for nonpregnant women and 2 drinks a day for men. One drink equals 12 oz of beer, 5 oz of wine, or 1 oz of hard liquor.  Work with your health care provider to maintain a healthy body weight or to lose weight. Ask what an ideal weight is for you.  Get at least 30 minutes of exercise that causes your heart to beat faster (aerobic exercise) most days of the week. Activities may include walking, swimming, or biking.  Work with your health care provider or diet and nutrition specialist (dietitian) to adjust your eating plan to your individual calorie needs. Reading food labels   Check food labels for the amount of sodium per serving. Choose foods with less than 5 percent of the Daily Value of sodium. Generally, foods with less than 300 mg of sodium per serving fit into this eating plan.  To find whole grains, look for the word "whole" as the first word in the ingredient list. Shopping  Buy products labeled as "low-sodium" or "no salt added."  Buy fresh foods. Avoid canned foods and premade or frozen meals. Cooking  Avoid adding salt when cooking. Use salt-free seasonings or herbs instead of table salt or sea salt. Check with your health care provider or pharmacist before using salt substitutes.  Do not fry foods. Cook foods using healthy methods such as baking, boiling, grilling, and broiling instead.  Cook with  heart-healthy oils, such as olive, canola, soybean, or sunflower oil. Meal planning  Eat a balanced diet that includes: ? 5 or more servings of fruits and vegetables each day. At each meal, try to fill half of your plate with fruits and vegetables. ? Up to 6-8 servings of whole grains each day. ? Less than 6 oz of lean meat, poultry, or fish each day. A 3-oz serving of meat is about the same size as a deck of cards. One egg equals 1 oz. ? 2 servings of low-fat dairy each day. ? A serving of nuts, seeds, or beans 5 times each week. ? Heart-healthy fats. Healthy fats called Omega-3 fatty acids are found in foods such as flaxseeds and coldwater fish, like sardines, salmon, and mackerel.  Limit how much you eat of the following: ? Canned or prepackaged foods. ? Food that is high in trans fat, such as fried foods. ? Food that is high in saturated fat, such as fatty meat. ? Sweets, desserts, sugary drinks, and other foods with added sugar. ? Full-fat dairy products.  Do not salt foods before eating.  Try to eat at least 2 vegetarian meals each week.  Eat more home-cooked food and less restaurant, buffet, and fast food.  When eating at a restaurant, ask that your food be prepared with less salt or no salt, if possible. What foods are recommended? The items listed may not be a complete list. Talk with your dietitian about   what dietary choices are best for you. Grains Whole-grain or whole-wheat bread. Whole-grain or whole-wheat pasta. Brown rice. Oatmeal. Quinoa. Bulgur. Whole-grain and low-sodium cereals. Pita bread. Low-fat, low-sodium crackers. Whole-wheat flour tortillas. Vegetables Fresh or frozen vegetables (raw, steamed, roasted, or grilled). Low-sodium or reduced-sodium tomato and vegetable juice. Low-sodium or reduced-sodium tomato sauce and tomato paste. Low-sodium or reduced-sodium canned vegetables. Fruits All fresh, dried, or frozen fruit. Canned fruit in natural juice (without  added sugar). Meat and other protein foods Skinless chicken or turkey. Ground chicken or turkey. Pork with fat trimmed off. Fish and seafood. Egg whites. Dried beans, peas, or lentils. Unsalted nuts, nut butters, and seeds. Unsalted canned beans. Lean cuts of beef with fat trimmed off. Low-sodium, lean deli meat. Dairy Low-fat (1%) or fat-free (skim) milk. Fat-free, low-fat, or reduced-fat cheeses. Nonfat, low-sodium ricotta or cottage cheese. Low-fat or nonfat yogurt. Low-fat, low-sodium cheese. Fats and oils Soft margarine without trans fats. Vegetable oil. Low-fat, reduced-fat, or light mayonnaise and salad dressings (reduced-sodium). Canola, safflower, olive, soybean, and sunflower oils. Avocado. Seasoning and other foods Herbs. Spices. Seasoning mixes without salt. Unsalted popcorn and pretzels. Fat-free sweets. What foods are not recommended? The items listed may not be a complete list. Talk with your dietitian about what dietary choices are best for you. Grains Baked goods made with fat, such as croissants, muffins, or some breads. Dry pasta or rice meal packs. Vegetables Creamed or fried vegetables. Vegetables in a cheese sauce. Regular canned vegetables (not low-sodium or reduced-sodium). Regular canned tomato sauce and paste (not low-sodium or reduced-sodium). Regular tomato and vegetable juice (not low-sodium or reduced-sodium). Pickles. Olives. Fruits Canned fruit in a light or heavy syrup. Fried fruit. Fruit in cream or butter sauce. Meat and other protein foods Fatty cuts of meat. Ribs. Fried meat. Bacon. Sausage. Bologna and other processed lunch meats. Salami. Fatback. Hotdogs. Bratwurst. Salted nuts and seeds. Canned beans with added salt. Canned or smoked fish. Whole eggs or egg yolks. Chicken or turkey with skin. Dairy Whole or 2% milk, cream, and half-and-half. Whole or full-fat cream cheese. Whole-fat or sweetened yogurt. Full-fat cheese. Nondairy creamers. Whipped toppings.  Processed cheese and cheese spreads. Fats and oils Butter. Stick margarine. Lard. Shortening. Ghee. Bacon fat. Tropical oils, such as coconut, palm kernel, or palm oil. Seasoning and other foods Salted popcorn and pretzels. Onion salt, garlic salt, seasoned salt, table salt, and sea salt. Worcestershire sauce. Tartar sauce. Barbecue sauce. Teriyaki sauce. Soy sauce, including reduced-sodium. Steak sauce. Canned and packaged gravies. Fish sauce. Oyster sauce. Cocktail sauce. Horseradish that you find on the shelf. Ketchup. Mustard. Meat flavorings and tenderizers. Bouillon cubes. Hot sauce and Tabasco sauce. Premade or packaged marinades. Premade or packaged taco seasonings. Relishes. Regular salad dressings. Where to find more information:  National Heart, Lung, and Blood Institute: www.nhlbi.nih.gov  American Heart Association: www.heart.org Summary  The DASH eating plan is a healthy eating plan that has been shown to reduce high blood pressure (hypertension). It may also reduce your risk for type 2 diabetes, heart disease, and stroke.  With the DASH eating plan, you should limit salt (sodium) intake to 2,300 mg a day. If you have hypertension, you may need to reduce your sodium intake to 1,500 mg a day.  When on the DASH eating plan, aim to eat more fresh fruits and vegetables, whole grains, lean proteins, low-fat dairy, and heart-healthy fats.  Work with your health care provider or diet and nutrition specialist (dietitian) to adjust your eating plan to your   individual calorie needs. This information is not intended to replace advice given to you by your health care provider. Make sure you discuss any questions you have with your health care provider. Document Revised: 04/05/2017 Document Reviewed: 04/16/2016 Elsevier Patient Education  2020 Elsevier Inc.  

## 2020-01-16 LAB — CBC WITH DIFFERENTIAL/PLATELET
Basophils Absolute: 0 10*3/uL (ref 0.0–0.2)
Basos: 1 %
EOS (ABSOLUTE): 0.1 10*3/uL (ref 0.0–0.4)
Eos: 3 %
Hematocrit: 34.1 % — ABNORMAL LOW (ref 37.5–51.0)
Hemoglobin: 11.3 g/dL — ABNORMAL LOW (ref 13.0–17.7)
Immature Grans (Abs): 0 10*3/uL (ref 0.0–0.1)
Immature Granulocytes: 0 %
Lymphocytes Absolute: 1.7 10*3/uL (ref 0.7–3.1)
Lymphs: 42 %
MCH: 32.8 pg (ref 26.6–33.0)
MCHC: 33.1 g/dL (ref 31.5–35.7)
MCV: 99 fL — ABNORMAL HIGH (ref 79–97)
Monocytes Absolute: 0.3 10*3/uL (ref 0.1–0.9)
Monocytes: 8 %
Neutrophils Absolute: 1.9 10*3/uL (ref 1.4–7.0)
Neutrophils: 46 %
Platelets: 91 10*3/uL — CL (ref 150–450)
RBC: 3.45 x10E6/uL — ABNORMAL LOW (ref 4.14–5.80)
RDW: 13.7 % (ref 11.6–15.4)
WBC: 4 10*3/uL (ref 3.4–10.8)

## 2020-01-16 LAB — LIPID PANEL
Chol/HDL Ratio: 4.7 ratio (ref 0.0–5.0)
Cholesterol, Total: 123 mg/dL (ref 100–199)
HDL: 26 mg/dL — ABNORMAL LOW (ref >39)
LDL Chol Calc (NIH): 70 mg/dL (ref 0–99)
Triglycerides: 152 mg/dL — ABNORMAL HIGH (ref 0–149)
VLDL Cholesterol Cal: 27 mg/dL (ref 5–40)

## 2020-01-16 LAB — CMP14+EGFR
ALT: 22 IU/L (ref 0–44)
AST: 38 IU/L (ref 0–40)
Albumin/Globulin Ratio: 2 (ref 1.2–2.2)
Albumin: 4.3 g/dL (ref 3.8–4.8)
Alkaline Phosphatase: 57 IU/L (ref 48–121)
BUN/Creatinine Ratio: 18 (ref 10–24)
BUN: 28 mg/dL — ABNORMAL HIGH (ref 8–27)
Bilirubin Total: 0.4 mg/dL (ref 0.0–1.2)
CO2: 18 mmol/L — ABNORMAL LOW (ref 20–29)
Calcium: 9.4 mg/dL (ref 8.6–10.2)
Chloride: 105 mmol/L (ref 96–106)
Creatinine, Ser: 1.53 mg/dL — ABNORMAL HIGH (ref 0.76–1.27)
GFR calc Af Amer: 54 mL/min/{1.73_m2} — ABNORMAL LOW (ref >59)
GFR calc non Af Amer: 46 mL/min/{1.73_m2} — ABNORMAL LOW (ref >59)
Globulin, Total: 2.1 g/dL (ref 1.5–4.5)
Glucose: 54 mg/dL — ABNORMAL LOW (ref 65–99)
Potassium: 4.8 mmol/L (ref 3.5–5.2)
Sodium: 138 mmol/L (ref 134–144)
Total Protein: 6.4 g/dL (ref 6.0–8.5)

## 2020-03-01 DIAGNOSIS — Z23 Encounter for immunization: Secondary | ICD-10-CM | POA: Diagnosis not present

## 2020-03-02 ENCOUNTER — Ambulatory Visit: Payer: Medicare Other

## 2020-03-11 DIAGNOSIS — Z23 Encounter for immunization: Secondary | ICD-10-CM | POA: Diagnosis not present

## 2020-03-21 ENCOUNTER — Other Ambulatory Visit: Payer: Self-pay

## 2020-03-21 ENCOUNTER — Inpatient Hospital Stay (HOSPITAL_COMMUNITY): Payer: Medicare Other | Attending: Hematology

## 2020-03-21 DIAGNOSIS — D631 Anemia in chronic kidney disease: Secondary | ICD-10-CM | POA: Diagnosis not present

## 2020-03-21 DIAGNOSIS — Z87891 Personal history of nicotine dependence: Secondary | ICD-10-CM | POA: Insufficient documentation

## 2020-03-21 DIAGNOSIS — E782 Mixed hyperlipidemia: Secondary | ICD-10-CM | POA: Insufficient documentation

## 2020-03-21 DIAGNOSIS — I2510 Atherosclerotic heart disease of native coronary artery without angina pectoris: Secondary | ICD-10-CM | POA: Diagnosis not present

## 2020-03-21 DIAGNOSIS — Z8249 Family history of ischemic heart disease and other diseases of the circulatory system: Secondary | ICD-10-CM | POA: Insufficient documentation

## 2020-03-21 DIAGNOSIS — Z79899 Other long term (current) drug therapy: Secondary | ICD-10-CM | POA: Diagnosis not present

## 2020-03-21 DIAGNOSIS — I10 Essential (primary) hypertension: Secondary | ICD-10-CM | POA: Insufficient documentation

## 2020-03-21 DIAGNOSIS — Z801 Family history of malignant neoplasm of trachea, bronchus and lung: Secondary | ICD-10-CM | POA: Diagnosis not present

## 2020-03-21 DIAGNOSIS — D509 Iron deficiency anemia, unspecified: Secondary | ICD-10-CM

## 2020-03-21 DIAGNOSIS — N183 Chronic kidney disease, stage 3 unspecified: Secondary | ICD-10-CM | POA: Insufficient documentation

## 2020-03-21 DIAGNOSIS — Z833 Family history of diabetes mellitus: Secondary | ICD-10-CM | POA: Insufficient documentation

## 2020-03-21 DIAGNOSIS — Z7984 Long term (current) use of oral hypoglycemic drugs: Secondary | ICD-10-CM | POA: Diagnosis not present

## 2020-03-21 DIAGNOSIS — E119 Type 2 diabetes mellitus without complications: Secondary | ICD-10-CM | POA: Insufficient documentation

## 2020-03-21 DIAGNOSIS — Z7982 Long term (current) use of aspirin: Secondary | ICD-10-CM | POA: Insufficient documentation

## 2020-03-21 DIAGNOSIS — D696 Thrombocytopenia, unspecified: Secondary | ICD-10-CM | POA: Diagnosis not present

## 2020-03-21 DIAGNOSIS — I252 Old myocardial infarction: Secondary | ICD-10-CM | POA: Diagnosis not present

## 2020-03-21 LAB — CBC WITH DIFFERENTIAL/PLATELET
Abs Immature Granulocytes: 0.01 10*3/uL (ref 0.00–0.07)
Basophils Absolute: 0 10*3/uL (ref 0.0–0.1)
Basophils Relative: 1 %
Eosinophils Absolute: 0 10*3/uL (ref 0.0–0.5)
Eosinophils Relative: 1 %
HCT: 32.1 % — ABNORMAL LOW (ref 39.0–52.0)
Hemoglobin: 10.6 g/dL — ABNORMAL LOW (ref 13.0–17.0)
Immature Granulocytes: 0 %
Lymphocytes Relative: 25 %
Lymphs Abs: 1.1 10*3/uL (ref 0.7–4.0)
MCH: 34.2 pg — ABNORMAL HIGH (ref 26.0–34.0)
MCHC: 33 g/dL (ref 30.0–36.0)
MCV: 103.5 fL — ABNORMAL HIGH (ref 80.0–100.0)
Monocytes Absolute: 0.3 10*3/uL (ref 0.1–1.0)
Monocytes Relative: 8 %
Neutro Abs: 2.8 10*3/uL (ref 1.7–7.7)
Neutrophils Relative %: 65 %
Platelets: 82 10*3/uL — ABNORMAL LOW (ref 150–400)
RBC: 3.1 MIL/uL — ABNORMAL LOW (ref 4.22–5.81)
RDW: 14.2 % (ref 11.5–15.5)
WBC: 4.3 10*3/uL (ref 4.0–10.5)
nRBC: 0 % (ref 0.0–0.2)

## 2020-03-21 LAB — IRON AND TIBC
Iron: 86 ug/dL (ref 45–182)
Saturation Ratios: 25 % (ref 17.9–39.5)
TIBC: 342 ug/dL (ref 250–450)
UIBC: 256 ug/dL

## 2020-03-21 LAB — FERRITIN: Ferritin: 86 ng/mL (ref 24–336)

## 2020-03-21 LAB — VITAMIN B12: Vitamin B-12: 981 pg/mL — ABNORMAL HIGH (ref 180–914)

## 2020-03-28 ENCOUNTER — Inpatient Hospital Stay (HOSPITAL_BASED_OUTPATIENT_CLINIC_OR_DEPARTMENT_OTHER): Payer: Medicare Other | Admitting: Hematology

## 2020-03-28 ENCOUNTER — Other Ambulatory Visit: Payer: Self-pay

## 2020-03-28 VITALS — BP 117/59 | HR 57 | Temp 97.3°F | Resp 18 | Wt 158.8 lb

## 2020-03-28 DIAGNOSIS — D696 Thrombocytopenia, unspecified: Secondary | ICD-10-CM

## 2020-03-28 DIAGNOSIS — E782 Mixed hyperlipidemia: Secondary | ICD-10-CM | POA: Diagnosis not present

## 2020-03-28 DIAGNOSIS — E119 Type 2 diabetes mellitus without complications: Secondary | ICD-10-CM | POA: Diagnosis not present

## 2020-03-28 DIAGNOSIS — I252 Old myocardial infarction: Secondary | ICD-10-CM | POA: Diagnosis not present

## 2020-03-28 DIAGNOSIS — D631 Anemia in chronic kidney disease: Secondary | ICD-10-CM | POA: Diagnosis not present

## 2020-03-28 DIAGNOSIS — N183 Chronic kidney disease, stage 3 unspecified: Secondary | ICD-10-CM | POA: Diagnosis not present

## 2020-03-28 NOTE — Progress Notes (Signed)
Edward [name], Edward [name] [id number]   CLINIC:  Medical Oncology/Hematology  PCP:  Edward [name], [place] / [place] [id number]  [phone]  REASON FOR VISIT:  Follow-up for thrombocytopenia  PRIOR THERAPY: None  CURRENT THERAPY: Observation  INTERVAL HISTORY:  [name], a 69 y.o. male, returns for routine follow-up for his thrombocytopenia. Edward [name] was last seen on 12/22/2019.  Today he is accompanied by his wife and he reports feeling well. He reports having melena since his last visit since he takes 1 ferrous sulfate tablet daily, but denies having constipation, rectal bleeding or hematochezia, though he gets easy bruising especially when drawing blood. His energy levels are at 50% and stable.   REVIEW OF SYSTEMS:  Review of Systems  Constitutional: Positive for appetite change (50%) and fatigue (50%).  Gastrointestinal: Positive for diarrhea. Negative for blood in stool, constipation and rectal pain.  Neurological: Positive for dizziness and numbness (hands).  Hematological: Bruises/bleeds easily (easy bruising).  All other systems reviewed and are negative.   PAST MEDICAL/SURGICAL HISTORY:  Past Medical History:  Diagnosis Date   Cataract    CKD (chronic kidney disease) stage 3, GFR 30-59 ml/min    Coronary atherosclerosis of native coronary artery    BMS LAD 2006; residual 75% distal RCA; EF 50%   Essential hypertension    Glaucoma    Low back pain    MI (myocardial infarction) ([place])    Anterior 2006   Mixed hyperlipidemia    Renal insufficiency    Type 2 diabetes mellitus (HCC)    Past Surgical History:  Procedure Laterality Date   CATARACT EXTRACTION, BILATERAL     CORONARY ANGIOPLASTY WITH STENT PLACEMENT  2006    SOCIAL HISTORY:  Social History   Socioeconomic History   Marital status: Married    Spouse name: Not on file   Number of children: Not on file   Years of  education: 7th   Highest education level: 7th grade  Occupational History   Occupation: Retired  Tobacco Use   Smoking status: Former Smoker    Packs/day: 2.00    Years: 20.00    Pack years: 40.00    Types: Cigarettes    Quit date: 05/08/1987    Years since quitting: 32.9   Smokeless tobacco: Never Used  Vaping Use   Vaping Use: Never used  Substance and Sexual Activity   Alcohol use: No    Alcohol/week: 0.0 standard drinks   Drug use: No   Sexual activity: Not Currently  Other Topics Concern   Not on file  Social History Narrative   Not on file   Social Determinants of Health   Financial Resource Strain:    Difficulty of Paying Living Expenses: Not on file  Food Insecurity:    Worried About Charity fundraiser in the Last Year: Not on file   [employer] of Food in the Last Year: Not on file  Transportation Needs:    Lack of Transportation (Medical): Not on file   Lack of Transportation (Non-Medical): Not on file  Physical Activity:    Days of Exercise per Week: Not on file   Minutes of Exercise per Session: Not on file  Stress:    Feeling of Stress : Not on file  Social Connections:    Frequency of Communication with Friends and Family: Not on file   Frequency of Social Gatherings with Friends and Family: Not on file  Attends Religious Services: Not on file   Active Member of Clubs or Organizations: Not on file   Attends Archivist Meetings: Not on file   Marital Status: Not on file  Intimate Partner Violence:    Fear of Current or Ex-Partner: Not on file   Emotionally Abused: Not on file   Physically Abused: Not on file   Sexually Abused: Not on file    FAMILY HISTORY:  Family History  Problem Relation Age of Onset   Aneurysm Father        Brain aneurysm   Lung cancer Mother        Small cell carcinoma of lung,kidney and heart   Heart disease Mother    Cancer Brother        lungs   Diabetes Sister    Diabetes  Sister    Diabetes Brother    Diabetes Brother     CURRENT MEDICATIONS:  Current Outpatient Medications  Medication Sig Dispense Refill   ACCU-CHEK AVIVA PLUS test strip USE TO CHECK BLOOD SUGAR ONCE DAILY 100 each 3   aspirin 81 MG tablet Take 81 mg by mouth daily.       atorvastatin (LIPITOR) 40 MG tablet Take 1 tablet (40 mg total) by mouth daily. 90 tablet 1   brimonidine (ALPHAGAN) 0.15 % ophthalmic solution Place 1 drop into both eyes 2 (two) times daily.     brimonidine (ALPHAGAN) 0.2 % ophthalmic solution SMARTSIG:In Eye(s)     Cholecalciferol (VITAMIN D3) 2000 UNITS TABS Take 1 tablet by mouth daily.     Cyanocobalamin (B-12 PO) Take by mouth.     Ferrous Sulfate (IRON PO) Take by mouth.     fish oil-omega-3 fatty acids 1000 MG capsule Take 1 g by mouth 2 (two) times daily.       gemfibrozil (LOPID) 600 MG tablet TAKE 1 TABLET BY MOUTH TWICE DAILY BEFORE MEAL(S) 180 tablet 1   glipiZIDE (GLUCOTROL) 10 MG tablet TAKE 1 TABLET BY MOUTH TWICE DAILY BEFORE MEAL(S) 180 tablet 1   isosorbide mononitrate (IMDUR) 30 MG 24 hr tablet Take 1 tablet (30 mg total) by mouth daily. New 01/30/2018 90 tablet 1   lisinopril (ZESTRIL) 10 MG tablet Take 1 tablet (10 mg total) by mouth daily. 90 tablet 1   metFORMIN (GLUCOPHAGE) 1000 MG tablet Take 0.5 tablets (500 mg total) by mouth 2 (two) times daily with a meal. TAKE 1/2 (ONE-HALF) TABLET BY MOUTH IN THE MORNING AND 1 IN THE EVENING 180 tablet 1   metoprolol tartrate (LOPRESSOR) 25 MG tablet Take 1 tablet (25 mg total) by mouth 2 (two) times daily. 180 tablet 1   pantoprazole (PROTONIX) 40 MG tablet Take 1 tablet (40 mg total) by mouth daily. 14 tablet 0   traZODone (DESYREL) 100 MG tablet Take 1 tablet (100 mg total) by mouth at bedtime. 90 tablet 1   nitroGLYCERIN (NITROSTAT) 0.4 MG SL tablet Place 1 tablet (0.4 mg total) under the tongue every 5 (five) minutes x 3 doses as needed for chest pain (if no relief after 3rd dose,  proceed to the ED for an evaluation). (Patient not taking: Reported on 03/28/2020) 25 tablet 1   No current facility-administered medications for this visit.    ALLERGIES:  No Known Allergies  PHYSICAL EXAM:  Performance status (ECOG): 0 - Asymptomatic  Vitals:   03/28/20 1535  BP: (!) 117/59  Pulse: (!) 57  Resp: 18  Temp: (!) 97.3 F (36.3 C)  SpO2: 100%  Wt Readings from Last 3 Encounters:  03/28/20 158 lb 12.8 oz (72 kg)  01/15/20 156 lb (70.8 kg)  12/22/19 155 lb 12.8 oz (70.7 kg)   Physical Exam Vitals reviewed.  Constitutional:      Appearance: Normal appearance.  Cardiovascular:     Rate and Rhythm: Normal rate and regular rhythm.     Pulses: Normal pulses.     Heart sounds: Normal heart sounds.  Pulmonary:     Effort: Pulmonary effort is normal.     Breath sounds: Normal breath sounds.  Abdominal:     Palpations: Abdomen is soft. There is no hepatomegaly, splenomegaly or mass.     Tenderness: There is no abdominal tenderness.     Hernia: No hernia is present.  Musculoskeletal:     Right lower leg: No edema.     Left lower leg: No edema.  Lymphadenopathy:     Cervical: No cervical adenopathy.     Upper Body:     Right upper body: No supraclavicular adenopathy.     Left upper body: No supraclavicular adenopathy.  Neurological:     General: No focal deficit present.     Mental Status: He is alert and oriented to person, place, and time.  Psychiatric:        Mood and Affect: Mood normal.        Behavior: Behavior normal.     LABORATORY DATA:  I have reviewed the labs as listed.  CBC Latest Ref Rng & Units 03/21/2020 01/15/2020 12/15/2019  WBC 4.0 - 10.5 K/uL 4.3 4.0 3.9(L)  Hemoglobin 13.0 - 17.0 g/dL 10.6(L) 11.3(L) 11.7(L)  Hematocrit 39 - 52 % 32.1(L) 34.1(L) 36.1(L)  Platelets 150 - 400 K/uL 82(L) 91(LL) 92(L)   CMP Latest Ref Rng & Units 01/15/2020 10/14/2019 10/02/2019  Glucose 65 - 99 mg/dL 54(L) 123(H) 96  BUN 8 - 27 mg/dL 28(H) 22 16    Creatinine 0.76 - 1.27 mg/dL 1.53(H) 1.34(H) 1.36(H)  Sodium 134 - 144 mmol/L 138 138 140  Potassium 3.5 - 5.2 mmol/L 4.8 5.5(H) 5.2  Chloride 96 - 106 mmol/L 105 108 107(H)  CO2 20 - 29 mmol/L 18(L) 21(L) 17(L)  Calcium 8.6 - 10.2 mg/dL 9.4 9.1 9.4  Total Protein 6.0 - 8.5 g/dL 6.4 7.1 6.9  Total Bilirubin 0.0 - 1.2 mg/dL 0.4 0.9 0.5  Alkaline Phos 48 - 121 IU/L 57 42 58  AST 0 - 40 IU/L 38 36 32  ALT 0 - 44 IU/L 22 25 19       Component Value Date/Time   RBC 3.10 (L) 03/21/2020 1019   MCV 103.5 (H) 03/21/2020 1019   MCV 99 (H) 01/15/2020 0957   MCH 34.2 (H) 03/21/2020 1019   MCHC 33.0 03/21/2020 1019   RDW 14.2 03/21/2020 1019   RDW 13.7 01/15/2020 0957   LYMPHSABS 1.1 03/21/2020 1019   LYMPHSABS 1.7 01/15/2020 0957   MONOABS 0.3 03/21/2020 1019   EOSABS 0.0 03/21/2020 1019   EOSABS 0.1 01/15/2020 0957   BASOSABS 0.0 03/21/2020 1019   BASOSABS 0.0 01/15/2020 0957   Lab Results  Component Value Date   TIBC 342 03/21/2020   TIBC 382 10/14/2019   FERRITIN 86 03/21/2020   FERRITIN 56 10/14/2019   IRONPCTSAT 25 03/21/2020   IRONPCTSAT 22 10/14/2019    DIAGNOSTIC IMAGING:  I have independently reviewed the scans and discussed with the patient. No results found.   ASSESSMENT:  1. Moderate thrombocytopenia: -Platelet count 95 on 10/02/2019 and 99 on 10/06/2019. -  MCV was elevated at 200. Has easy bruising on the upper extremities but no active bleeding. Platelet count prior to that 11 years ago was normal. -He does not have any B symptoms. Denies any history of hepatitis or history of blood transfusion. -Physical exam reveals palpable liver edge. No palpable splenomegaly.  2. CKD: -He has CKD with creatinine ranging from 1.2-1.6. -Renal ultrasound on 02/07/2016 shows mild bilateral lobular renal contour. Scarring cannot be excluded. No mass or hydronephrosis.   PLAN:  1. Moderate thrombocytopenia: -Differential diagnosis includes immune mediated  thrombocytopenia versus MDS. -Platelet count is stable between 80-90. -No active bleeding.  Recommend follow-up in 4 months with repeat labs.  2. Macrocytic anemia: -Combination anemia from CKD and relative iron deficiency. -She is tolerating iron tablet daily very well. -Hemoglobin dropped to 10.6 although ferritin improved to 86. -He will continue iron tablet daily.  RTC 4 months with repeat labs and iron panel.  If there is no improvement in the hemoglobin or iron panel, will consider parenteral iron therapy.  Orders placed this encounter:  No orders of the defined types were placed in this encounter.    Derek Jack, MD [place] [phone]   I, Milinda Antis, am acting as a scribe for Dr. Sanda Linger.  I, Derek Jack MD, have reviewed the above documentation for accuracy and completeness, and I agree with the above.

## 2020-03-28 NOTE — Patient Instructions (Signed)
[place] at [facility] Discharge Instructions  You were seen today by Dr. Delton Coombes. He went over your recent results. Continue taking 1 iron tablet daily and drink plenty of water. Dr. Delton Coombes will see you back in 4 months for labs and follow up.   Thank you for choosing [place] at [facility] to provide your oncology and hematology care.  To afford each patient quality time with our provider, please arrive at least 15 minutes before your scheduled appointment time.   If you have a lab appointment with the [place] please come in thru the Main Entrance and check in at the main information desk  You need to re-schedule your appointment should you arrive 10 or more minutes late.  We strive to give you quality time with our providers, and arriving late affects you and other patients whose appointments are after yours.  Also, if you no show three or more times for appointments you may be dismissed from the clinic at the providers discretion.     Again, thank you for choosing [facility].  Our hope is that these requests will decrease the amount of time that you wait before being seen by our physicians.       _____________________________________________________________  Should you have questions after your visit to [facility], please contact our office at [phone] between the hours of 8:00 a.m. and 4:30 p.m.  Voicemails left after 4:00 p.m. will not be returned until the following business day.  For prescription refill requests, have your pharmacy contact our office and allow 72 hours.    Cancer Center Support Programs:   > Cancer Support Group  2nd Tuesday of the month 1pm-2pm, Journey Room

## 2020-04-08 ENCOUNTER — Telehealth: Payer: Self-pay

## 2020-04-08 NOTE — Telephone Encounter (Signed)
Pts wife called stating that they received a missed call from [facility].  Explained that it was most likely our automated system calling to let them know that based on our records, pt is overdue for 2nd COVID shot and flu shot.  Wife said that pt has already had his 2nd and booster covid shot at [facility] and does not take the flu shot.

## 2020-04-15 ENCOUNTER — Ambulatory Visit (INDEPENDENT_AMBULATORY_CARE_PROVIDER_SITE_OTHER): Payer: Medicare Other | Admitting: Cardiology

## 2020-04-15 ENCOUNTER — Other Ambulatory Visit: Payer: Self-pay

## 2020-04-15 ENCOUNTER — Encounter: Payer: Self-pay | Admitting: Cardiology

## 2020-04-15 VITALS — BP 108/50 | HR 58 | Resp 16 | Ht 70.0 in | Wt 160.4 lb

## 2020-04-15 DIAGNOSIS — E782 Mixed hyperlipidemia: Secondary | ICD-10-CM | POA: Diagnosis not present

## 2020-04-15 DIAGNOSIS — I25119 Atherosclerotic heart disease of native coronary artery with unspecified angina pectoris: Secondary | ICD-10-CM | POA: Diagnosis not present

## 2020-04-15 DIAGNOSIS — I10 Essential (primary) hypertension: Secondary | ICD-10-CM

## 2020-04-15 MED ORDER — NITROGLYCERIN 0.4 MG SL SUBL: 0.4000 mg | SUBLINGUAL_TABLET | SUBLINGUAL | 1 refills | Status: DC | PRN

## 2020-04-15 NOTE — Progress Notes (Signed)
Cardiology Office Note  Date: 04/15/2020   ID: [name], DOB [birth year], MRN [id number]  PCP:  Edward Pretty, FNP  Cardiologist:  Edward Lesches, MD Electrophysiologist:  None   Chief Complaint  Patient presents with  . Follow-up    6 mth    History of Present Illness: Edward [name] is a 69 y.o. male last seen in June.  He presents for a routine visit.  States that he has used only a few nitroglycerin since I last saw him, no progressive functional limitations.  Lexiscan Myoviewin October 2019revealed a region of anterior,anteroseptal scar as well as a region of inferior/inferolateral scar with moderate peri-infarct ischemia. LVEF was 53%. He is several years out from BMS to the LAD in 2006, also had residual distal RCA disease that was managed medically.  He has preferred to hold off on cardiac catheterization unless symptoms escalate.  I reviewed his medications which are stable from a cardiac perspective.  Blood pressure and heart rate are well controlled.  His most recent LDL was 70.  Past Medical History:  Diagnosis Date  . Cataract   . CKD (chronic kidney disease) stage 3, GFR 30-59 ml/min (HCC)   . Coronary atherosclerosis of native coronary artery    BMS LAD 2006; residual 75% distal RCA; EF 50%  . Essential hypertension   . Glaucoma   . Low back pain   . MI (myocardial infarction) ([place])    Anterior 2006  . Mixed hyperlipidemia   . Renal insufficiency   . Type 2 diabetes mellitus ([place])     Past Surgical History:  Procedure Laterality Date  . CATARACT EXTRACTION, BILATERAL    . CORONARY ANGIOPLASTY WITH STENT PLACEMENT  2006    Current Outpatient Medications  Medication Sig Dispense Refill  . ACCU-CHEK AVIVA PLUS test strip USE TO CHECK BLOOD SUGAR ONCE DAILY 100 each 3  . aspirin 81 MG tablet Take 81 mg by mouth daily.    [name] atorvastatin (LIPITOR) 40 MG tablet Take 1 tablet (40 mg total) by mouth daily. 90 tablet 1  . brimonidine  (ALPHAGAN) 0.15 % ophthalmic solution Place 1 drop into both eyes 2 (two) times daily.    . brimonidine (ALPHAGAN) 0.2 % ophthalmic solution SMARTSIG:In Eye(s)    . Cholecalciferol (VITAMIN D3) 2000 UNITS TABS Take 1 tablet by mouth daily.    . Cyanocobalamin (B-12 PO) Take by mouth.    . Ferrous Sulfate (IRON PO) Take by mouth.    . fish oil-omega-3 fatty acids 1000 MG capsule Take 1 g by mouth 2 (two) times daily.    [name] gemfibrozil (LOPID) 600 MG tablet TAKE 1 TABLET BY MOUTH TWICE DAILY BEFORE MEAL(S) 180 tablet 1  . glipiZIDE (GLUCOTROL) 10 MG tablet TAKE 1 TABLET BY MOUTH TWICE DAILY BEFORE MEAL(S) 180 tablet 1  . lisinopril (ZESTRIL) 10 MG tablet Take 1 tablet (10 mg total) by mouth daily. 90 tablet 1  . metFORMIN (GLUCOPHAGE) 1000 MG tablet Take 0.5 tablets (500 mg total) by mouth 2 (two) times daily with a meal. TAKE 1/2 (ONE-HALF) TABLET BY MOUTH IN THE MORNING AND 1 IN THE EVENING 180 tablet 1  . metoprolol tartrate (LOPRESSOR) 25 MG tablet Take 1 tablet (25 mg total) by mouth 2 (two) times daily. 180 tablet 1  . pantoprazole (PROTONIX) 40 MG tablet Take 1 tablet (40 mg total) by mouth daily. 14 tablet 0  . traZODone (DESYREL) 100 MG tablet Take 1 tablet (100 mg total) by mouth  at bedtime. 90 tablet 1  . isosorbide mononitrate (IMDUR) 30 MG 24 hr tablet Take 1 tablet (30 mg total) by mouth daily. New 01/30/2018 90 tablet 1  . nitroGLYCERIN (NITROSTAT) 0.4 MG SL tablet Place 1 tablet (0.4 mg total) under the tongue every 5 (five) minutes x 3 doses as needed for chest pain (if no relief after 2nd dose, proceed to the ED for an evaluation). 25 tablet 1   No current facility-administered medications for this visit.   Allergies:  Patient has no known allergies.   ROS: No palpitations or syncope.  Physical Exam: VS:  BP (!) 108/50   Pulse (!) 58   Resp 16   Ht 5\' 10"  (1.778 m)   Wt 160 lb 6.4 oz (72.8 kg)   SpO2 99%   BMI 23.02 kg/m , BMI Body mass index is 23.02 kg/m.  Wt Readings  from Last 3 Encounters:  04/15/20 160 lb 6.4 oz (72.8 kg)  03/28/20 158 lb 12.8 oz (72 kg)  01/15/20 156 lb (70.8 kg)    General: Patient appears comfortable at rest. HEENT: Conjunctiva and lids normal, wearing a mask. Neck: Supple, no elevated JVP or carotid bruits, no thyromegaly. Lungs: Clear to auscultation, nonlabored breathing at rest. Cardiac: Regular rate and rhythm, no S3 or significant systolic murmur. Extremities: No pitting edema.  ECG:  An ECG dated 12/31/2018 was personally reviewed today and demonstrated:  Sinus bradycardia with decreased R wave progression and low voltage.  Recent Labwork: 01/15/2020: ALT 22; AST 38; BUN 28; Creatinine, Ser 1.53; Potassium 4.8; Sodium 138 03/21/2020: Hemoglobin 10.6; Platelets 82     Component Value Date/Time   CHOL 123 01/15/2020 0957   CHOL 177 09/25/2012 1015   TRIG 152 (H) 01/15/2020 0957   TRIG 206 (H) 10/07/2014 0927   TRIG 269 (H) 09/25/2012 1015   HDL 26 (L) 01/15/2020 0957   HDL 22 (L) 10/07/2014 0927   HDL 28 (L) 09/25/2012 1015   CHOLHDL 4.7 01/15/2020 0957   LDLCALC 70 01/15/2020 0957   LDLCALC 98 12/21/2013 0850   LDLCALC 95 09/25/2012 1015    Other Studies Reviewed Today:  [facility] Myoview 02/04/2018:  There was no ST segment deviation noted during stress.  Defect 1: There is a medium defect of severe severity present in the mid anteroseptal, apical anterior, apical septal and apex location. This is consistent with myocardial scar.  Defect 2: There is a medium defect of moderate severity present in the mid inferolateral, apical inferior and apical lateral location. This is consistent with scar with a moderate degree of peri-infarct ischemia.  Findings consistent with prior myocardial infarction with moderate peri-infarct ischemia in the inferolateral wall.  This is an intermediate risk study.  Nuclear stress EF: 53%.  Assessment and Plan:  1.  CAD status post BMS to the LAD in 2006 with moderate residual  RCA disease.  Last Myoview in 2019 is outlined above, he has preferred medical therapy and observation in the absence of accelerating angina.  No significant change in symptomatology reported today.  Refill fresh bottle of nitroglycerin.  Continue aspirin, Lipitor, Imdur, lisinopril, and Lopressor.  2.  Mixed hyperlipidemia on Lipitor.  Recent LDL 70.  3.  Essential hypertension, blood pressure low normal today.  No changes were made.  Medication Adjustments/Labs and Tests Ordered: Current medicines are reviewed at length with the patient today.  Concerns regarding medicines are outlined above.   Tests Ordered: No orders of the defined types were placed in this encounter.  Medication Changes: Meds ordered this encounter  Medications  . nitroGLYCERIN (NITROSTAT) 0.4 MG SL tablet    Sig: Place 1 tablet (0.4 mg total) under the tongue every 5 (five) minutes x 3 doses as needed for chest pain (if no relief after 2nd dose, proceed to the ED for an evaluation).    Dispense:  25 tablet    Refill:  1    Disposition:  Follow up 6 months in the [place] office.  Signed, Satira Sark, MD, [facility] 04/15/2020 11:33 AM    [facility] [place], [place], [place] [address] Phone: [phone]; Fax: [phone]

## 2020-04-15 NOTE — Patient Instructions (Addendum)

## 2020-04-28 ENCOUNTER — Ambulatory Visit (INDEPENDENT_AMBULATORY_CARE_PROVIDER_SITE_OTHER): Payer: Medicare Other | Admitting: Nurse Practitioner

## 2020-04-28 ENCOUNTER — Other Ambulatory Visit: Payer: Self-pay

## 2020-04-28 ENCOUNTER — Encounter: Payer: Self-pay | Admitting: Nurse Practitioner

## 2020-04-28 VITALS — BP 119/61 | HR 78 | Temp 98.0°F | Resp 20 | Ht 70.0 in | Wt 163.0 lb

## 2020-04-28 DIAGNOSIS — K219 Gastro-esophageal reflux disease without esophagitis: Secondary | ICD-10-CM

## 2020-04-28 DIAGNOSIS — E782 Mixed hyperlipidemia: Secondary | ICD-10-CM

## 2020-04-28 DIAGNOSIS — I25119 Atherosclerotic heart disease of native coronary artery with unspecified angina pectoris: Secondary | ICD-10-CM | POA: Diagnosis not present

## 2020-04-28 DIAGNOSIS — F5101 Primary insomnia: Secondary | ICD-10-CM | POA: Diagnosis not present

## 2020-04-28 DIAGNOSIS — E1159 Type 2 diabetes mellitus with other circulatory complications: Secondary | ICD-10-CM

## 2020-04-28 DIAGNOSIS — I152 Hypertension secondary to endocrine disorders: Secondary | ICD-10-CM | POA: Diagnosis not present

## 2020-04-28 DIAGNOSIS — N1832 Chronic kidney disease, stage 3b: Secondary | ICD-10-CM

## 2020-04-28 DIAGNOSIS — E1122 Type 2 diabetes mellitus with diabetic chronic kidney disease: Secondary | ICD-10-CM | POA: Diagnosis not present

## 2020-04-28 LAB — BAYER DCA HB A1C WAIVED: HB A1C (BAYER DCA - WAIVED): 6.3 % (ref <7.0)

## 2020-04-28 MED ORDER — LISINOPRIL 10 MG PO TABS: 10.0000 mg | ORAL_TABLET | Freq: Every day | ORAL | 1 refills | Status: DC

## 2020-04-28 MED ORDER — METOPROLOL TARTRATE 25 MG PO TABS: 25.0000 mg | ORAL_TABLET | Freq: Two times a day (BID) | ORAL | 1 refills | Status: DC

## 2020-04-28 MED ORDER — GLIPIZIDE 10 MG PO TABS: ORAL_TABLET | ORAL | 1 refills | Status: DC

## 2020-04-28 MED ORDER — GEMFIBROZIL 600 MG PO TABS: ORAL_TABLET | ORAL | 1 refills | Status: DC

## 2020-04-28 MED ORDER — ATORVASTATIN CALCIUM 40 MG PO TABS: 40.0000 mg | ORAL_TABLET | Freq: Every day | ORAL | 1 refills | Status: DC

## 2020-04-28 MED ORDER — ISOSORBIDE MONONITRATE ER 30 MG PO TB24: 30.0000 mg | ORAL_TABLET | Freq: Every day | ORAL | 1 refills | Status: DC

## 2020-04-28 MED ORDER — METFORMIN HCL 1000 MG PO TABS: 500.0000 mg | ORAL_TABLET | Freq: Two times a day (BID) | ORAL | 1 refills | Status: DC

## 2020-04-28 MED ORDER — TRAZODONE HCL 100 MG PO TABS: 100.0000 mg | ORAL_TABLET | Freq: Every day | ORAL | 1 refills | Status: DC

## 2020-04-28 NOTE — Progress Notes (Signed)
 Subjective:    Patient ID: Edward [name], male    DOB: 12/18/1950, 69 y.o.   MRN: [id number]   Chief Complaint: Medical Management of Chronic Issues    HPI:  1. Hypertension associated with diabetes (HCC) No c/o chest pain, sob or headache. He does not check his blood pressure at home. BP Readings from Last 3 Encounters:  04/28/20 119/61  04/15/20 (!) 108/50  03/28/20 (!) 117/59     2. Type 2 diabetes mellitus with stage 3b chronic kidney disease, without long-term current use of insulin (HCC) He only checks his blood sugars occasionally. He says when he does check them it is always 80-110. He denies any low blood sugars. Lab Results  Component Value Date   HGBA1C 6.5 01/15/2020     3. Mixed hyperlipidemia Doe swatch diet but does little to no exercise. Lab Results  Component Value Date   CHOL 123 01/15/2020   HDL 26 (L) 01/15/2020   LDLCALC 70 01/15/2020   TRIG 152 (H) 01/15/2020   CHOLHDL 4.7 01/15/2020     4. Stage 3b chronic kidney disease (HCC) Denies any problems voiding Lab Results  Component Value Date   CREATININE 1.53 (H) 01/15/2020     5. Atherosclerosis of native coronary artery of native heart with angina pectoris (HCC) He saw cardiology on 04/15/20. According to office note., no changes were made to plan of care.  6. Primary insomnia *he is on trazadone to sleep at night. Some nights he sleeps good and other nights he only sleeps 3-4 hours.  7 gerd He is on protonix only as needed. He says he has not been taking it much lately.  Outpatient Encounter Medications as of 04/28/2020  Medication Sig  . ACCU-CHEK AVIVA PLUS test strip USE TO CHECK BLOOD SUGAR ONCE DAILY  . aspirin 81 MG tablet Take 81 mg by mouth daily.  . atorvastatin (LIPITOR) 40 MG tablet Take 1 tablet (40 mg total) by mouth daily.  . brimonidine (ALPHAGAN) 0.2 % ophthalmic solution SMARTSIG:In Eye(s)  . Cholecalciferol (VITAMIN D3) 2000 UNITS TABS Take 1 tablet by mouth  daily.  . Cyanocobalamin (B-12 PO) Take by mouth.  . Ferrous Sulfate (IRON PO) Take by mouth.  . fish oil-omega-3 fatty acids 1000 MG capsule Take 1 g by mouth 2 (two) times daily.  . gemfibrozil (LOPID) 600 MG tablet TAKE 1 TABLET BY MOUTH TWICE DAILY BEFORE MEAL(S)  . glipiZIDE (GLUCOTROL) 10 MG tablet TAKE 1 TABLET BY MOUTH TWICE DAILY BEFORE MEAL(S)  . lisinopril (ZESTRIL) 10 MG tablet Take 1 tablet (10 mg total) by mouth daily.  . metFORMIN (GLUCOPHAGE) 1000 MG tablet Take 0.5 tablets (500 mg total) by mouth 2 (two) times daily with a meal. TAKE 1/2 (ONE-HALF) TABLET BY MOUTH IN THE MORNING AND 1 IN THE EVENING  . metoprolol tartrate (LOPRESSOR) 25 MG tablet Take 1 tablet (25 mg total) by mouth 2 (two) times daily.  . nitroGLYCERIN (NITROSTAT) 0.4 MG SL tablet Place 1 tablet (0.4 mg total) under the tongue every 5 (five) minutes x 3 doses as needed for chest pain (if no relief after 2nd dose, proceed to the ED for an evaluation).  . pantoprazole (PROTONIX) 40 MG tablet Take 1 tablet (40 mg total) by mouth daily.  . traZODone (DESYREL) 100 MG tablet Take 1 tablet (100 mg total) by mouth at bedtime.  . isosorbide mononitrate (IMDUR) 30 MG 24 hr tablet Take 1 tablet (30 mg total) by mouth daily. New 01/30/2018  . [  DISCONTINUED] brimonidine (ALPHAGAN) 0.15 % ophthalmic solution Place 1 drop into both eyes 2 (two) times daily.   No facility-administered encounter medications on file as of 04/28/2020.    Past Surgical History:  Procedure Laterality Date  . CATARACT EXTRACTION, BILATERAL    . CORONARY ANGIOPLASTY WITH STENT PLACEMENT  2006    Family History  Problem Relation Age of Onset  . Aneurysm Father        Brain aneurysm  . Lung cancer Mother        Small cell carcinoma of lung,kidney and heart  . Heart disease Mother   . Cancer Brother        lungs  . Diabetes Sister   . Diabetes Sister   . Diabetes Brother   . Diabetes Brother     New complaints: None today  Social  history: lives by hisself. Is retired  Controlled substance contract: n/a    Review of Systems  Constitutional: Negative for diaphoresis.  Eyes: Negative for pain.  Respiratory: Negative for shortness of breath.   Cardiovascular: Negative for chest pain, palpitations and leg swelling.  Gastrointestinal: Negative for abdominal pain.  Endocrine: Negative for polydipsia.  Skin: Negative for rash.  Neurological: Negative for dizziness, weakness and headaches.  Hematological: Does not bruise/bleed easily.  All other systems reviewed and are negative.      Objective:   Physical Exam Vitals and nursing note reviewed.  Constitutional:      Appearance: Normal appearance. He is well-developed and well-nourished.  HENT:     Head: Normocephalic.     Nose: Nose normal.     Mouth/Throat:     Mouth: Oropharynx is clear and moist.  Eyes:     Extraocular Movements: EOM normal.     Pupils: Pupils are equal, round, and reactive to light.  Neck:     Thyroid: No thyroid mass or thyromegaly.     Vascular: No carotid bruit or JVD.     Trachea: Phonation normal.  Cardiovascular:     Rate and Rhythm: Normal rate and regular rhythm.  Pulmonary:     Effort: Pulmonary effort is normal. No respiratory distress.     Breath sounds: Normal breath sounds.  Abdominal:     General: Bowel sounds are normal. Aorta is normal.     Palpations: Abdomen is soft.     Tenderness: There is no abdominal tenderness.  Musculoskeletal:        General: Normal range of motion.     Cervical back: Normal range of motion and neck supple.  Lymphadenopathy:     Cervical: No cervical adenopathy.  Skin:    General: Skin is warm and dry.  Neurological:     Mental Status: He is alert and oriented to person, place, and time.  Psychiatric:        Mood and Affect: Mood and affect normal.        Behavior: Behavior normal.        Thought Content: Thought content normal.        Judgment: Judgment normal.    BP 119/61    Pulse 78   Temp 98 F (36.7 C) (Temporal)   Resp 20   Ht 5' 10" (1.778 m)   Wt 163 lb (73.9 kg)   SpO2 100%   BMI 23.39 kg/m  Hgba1c 6.3%      Assessment & Plan:  Edward [name] comes in today with chief complaint of Medical Management of Chronic Issues   Diagnosis and orders  addressed:  1. Hypertension associated with diabetes ([place]) Low sodium diet - CBC with Differential/Platelet - CMP14+EGFR  2. Type 2 diabetes mellitus with stage 3b chronic kidney disease, without long-term current use of insulin (HCC) Continue t watch carbs in diet - Bayer DCA Hb A1c Waived - Microalbumin / creatinine urine ratio - metFORMIN (GLUCOPHAGE) 1000 MG tablet; Take 0.5 tablets (500 mg total) by mouth 2 (two) times daily with a meal. TAKE 1/2 (ONE-HALF) TABLET BY MOUTH IN THE MORNING AND 1 IN THE EVENING  Dispense: 180 tablet; Refill: 1 - lisinopril (ZESTRIL) 10 MG tablet; Take 1 tablet (10 mg total) by mouth daily.  Dispense: 90 tablet; Refill: 1 - glipiZIDE (GLUCOTROL) 10 MG tablet; TAKE 1 TABLET BY MOUTH TWICE DAILY BEFORE MEAL(S)  Dispense: 180 tablet; Refill: 1  3. Mixed hyperlipidemia Low fat diet - Lipid panel - atorvastatin (LIPITOR) 40 MG tablet; Take 1 tablet (40 mg total) by mouth daily.  Dispense: 90 tablet; Refill: 1 - gemfibrozil (LOPID) 600 MG tablet; TAKE 1 TABLET BY MOUTH TWICE DAILY BEFORE MEAL(S)  Dispense: 180 tablet; Refill: 1  4. Stage 3b chronic kidney disease ([place]) Labs pending  5. Atherosclerosis of native coronary artery of native heart with angina pectoris ([place]) Keep yearly follow up with cariology - isosorbide mononitrate (IMDUR) 30 MG 24 hr tablet; Take 1 tablet (30 mg total) by mouth daily. New 01/30/2018  Dispense: 90 tablet; Refill: 1 - metoprolol tartrate (LOPRESSOR) 25 MG tablet; Take 1 tablet (25 mg total) by mouth 2 (two) times daily.  Dispense: 180 tablet; Refill: 1   6. Primary insomnia Bedtime routine - traZODone (DESYREL) 100 MG tablet; Take 1  tablet (100 mg total) by mouth at bedtime.  Dispense: 90 tablet; Refill: 1  7. Gastroesophageal reflux disease without esophagitis Avoid spicy foods Do not eat 2 hours prior to bedtime   Labs pending Health Maintenance reviewed Diet and exercise encouraged  Follow up plan: 3 months   Mary-Margaret Hassell Done, FNP

## 2020-04-28 NOTE — Patient Instructions (Signed)

## 2020-04-29 LAB — CBC WITH DIFFERENTIAL/PLATELET
Basophils Absolute: 0 10*3/uL (ref 0.0–0.2)
Basos: 0 %
EOS (ABSOLUTE): 0 10*3/uL (ref 0.0–0.4)
Eos: 1 %
Hematocrit: 32.8 % — ABNORMAL LOW (ref 37.5–51.0)
Hemoglobin: 11.1 g/dL — ABNORMAL LOW (ref 13.0–17.7)
Immature Grans (Abs): 0 10*3/uL (ref 0.0–0.1)
Immature Granulocytes: 0 %
Lymphocytes Absolute: 1 10*3/uL (ref 0.7–3.1)
Lymphs: 22 %
MCH: 34.2 pg — ABNORMAL HIGH (ref 26.6–33.0)
MCHC: 33.8 g/dL (ref 31.5–35.7)
MCV: 101 fL — ABNORMAL HIGH (ref 79–97)
Monocytes Absolute: 0.4 10*3/uL (ref 0.1–0.9)
Monocytes: 9 %
Neutrophils Absolute: 3 10*3/uL (ref 1.4–7.0)
Neutrophils: 68 %
Platelets: 75 10*3/uL — CL (ref 150–450)
RBC: 3.25 x10E6/uL — ABNORMAL LOW (ref 4.14–5.80)
RDW: 12.8 % (ref 11.6–15.4)
WBC: 4.4 10*3/uL (ref 3.4–10.8)

## 2020-04-29 LAB — CMP14+EGFR
ALT: 15 IU/L (ref 0–44)
AST: 30 IU/L (ref 0–40)
Albumin/Globulin Ratio: 1.8 (ref 1.2–2.2)
Albumin: 4.1 g/dL (ref 3.8–4.8)
Alkaline Phosphatase: 56 IU/L (ref 44–121)
BUN/Creatinine Ratio: 13 (ref 10–24)
BUN: 36 mg/dL — ABNORMAL HIGH (ref 8–27)
Bilirubin Total: 0.4 mg/dL (ref 0.0–1.2)
CO2: 12 mmol/L — ABNORMAL LOW (ref 20–29)
Calcium: 9.4 mg/dL (ref 8.6–10.2)
Chloride: 103 mmol/L (ref 96–106)
Creatinine, Ser: 2.71 mg/dL — ABNORMAL HIGH (ref 0.76–1.27)
GFR calc Af Amer: 27 mL/min/{1.73_m2} — ABNORMAL LOW (ref >59)
GFR calc non Af Amer: 23 mL/min/{1.73_m2} — ABNORMAL LOW (ref >59)
Globulin, Total: 2.3 g/dL (ref 1.5–4.5)
Glucose: 51 mg/dL — ABNORMAL LOW (ref 65–99)
Potassium: 6.1 mmol/L (ref 3.5–5.2)
Sodium: 135 mmol/L (ref 134–144)
Total Protein: 6.4 g/dL (ref 6.0–8.5)

## 2020-04-29 LAB — LIPID PANEL
Chol/HDL Ratio: 4.6 ratio (ref 0.0–5.0)
Cholesterol, Total: 110 mg/dL (ref 100–199)
HDL: 24 mg/dL — ABNORMAL LOW (ref >39)
LDL Chol Calc (NIH): 67 mg/dL (ref 0–99)
Triglycerides: 96 mg/dL (ref 0–149)
VLDL Cholesterol Cal: 19 mg/dL (ref 5–40)

## 2020-05-02 ENCOUNTER — Other Ambulatory Visit: Payer: Medicare Other

## 2020-05-02 ENCOUNTER — Other Ambulatory Visit: Payer: Self-pay | Admitting: Family

## 2020-05-02 ENCOUNTER — Other Ambulatory Visit: Payer: Self-pay

## 2020-05-02 DIAGNOSIS — E875 Hyperkalemia: Secondary | ICD-10-CM

## 2020-05-03 ENCOUNTER — Telehealth: Payer: Self-pay

## 2020-05-03 ENCOUNTER — Telehealth (HOSPITAL_COMMUNITY): Payer: Self-pay | Admitting: Surgery

## 2020-05-03 LAB — BMP8+EGFR
BUN/Creatinine Ratio: 15 (ref 10–24)
BUN: 36 mg/dL — ABNORMAL HIGH (ref 8–27)
CO2: 17 mmol/L — ABNORMAL LOW (ref 20–29)
Calcium: 9.2 mg/dL (ref 8.6–10.2)
Chloride: 108 mmol/L — ABNORMAL HIGH (ref 96–106)
Creatinine, Ser: 2.48 mg/dL — ABNORMAL HIGH (ref 0.76–1.27)
GFR calc Af Amer: 30 mL/min/{1.73_m2} — ABNORMAL LOW (ref >59)
GFR calc non Af Amer: 26 mL/min/{1.73_m2} — ABNORMAL LOW (ref >59)
Glucose: 127 mg/dL — ABNORMAL HIGH (ref 65–99)
Potassium: 5.1 mmol/L (ref 3.5–5.2)
Sodium: 140 mmol/L (ref 134–144)

## 2020-05-03 NOTE — Telephone Encounter (Signed)
Pt's ex wife called to let our office know that the pt had lab work at his family doctor, and that his platelets were 75.  The pt's family doctor recommended that the pt call our office to see if the pt needs to change his April 2022 appointment.  I notified Dr. Landry Corporal, and he stated that the platelet levels fluctuate and that 75 was not much lower than the pt's last platelet level.  Dr. Ellin Saba stated that the pt can keep his April 2022 appointment.  I called the pt's ex wife back and let her know that the platelets fluctuate and that Dr. Ellin Saba was not worried about the change.  She verbalized understanding and stated that she would let the pt know.  I told her to call our office back if she had any concerns.

## 2020-05-03 NOTE — Telephone Encounter (Signed)
If we stop metformin then will have to go on more expensive meds that insurance may not cover.  Make him an appointment with clinical pharmacist to discuss.

## 2020-05-12 DIAGNOSIS — E559 Vitamin D deficiency, unspecified: Secondary | ICD-10-CM | POA: Diagnosis not present

## 2020-05-12 DIAGNOSIS — N1831 Chronic kidney disease, stage 3a: Secondary | ICD-10-CM | POA: Diagnosis not present

## 2020-05-12 DIAGNOSIS — Z79899 Other long term (current) drug therapy: Secondary | ICD-10-CM | POA: Diagnosis not present

## 2020-05-12 DIAGNOSIS — D631 Anemia in chronic kidney disease: Secondary | ICD-10-CM | POA: Diagnosis not present

## 2020-05-12 DIAGNOSIS — R809 Proteinuria, unspecified: Secondary | ICD-10-CM | POA: Diagnosis not present

## 2020-05-18 DIAGNOSIS — R809 Proteinuria, unspecified: Secondary | ICD-10-CM | POA: Diagnosis not present

## 2020-05-18 DIAGNOSIS — D631 Anemia in chronic kidney disease: Secondary | ICD-10-CM | POA: Diagnosis not present

## 2020-05-18 DIAGNOSIS — D696 Thrombocytopenia, unspecified: Secondary | ICD-10-CM | POA: Diagnosis not present

## 2020-05-18 DIAGNOSIS — N189 Chronic kidney disease, unspecified: Secondary | ICD-10-CM | POA: Diagnosis not present

## 2020-05-18 DIAGNOSIS — E1129 Type 2 diabetes mellitus with other diabetic kidney complication: Secondary | ICD-10-CM | POA: Diagnosis not present

## 2020-05-18 DIAGNOSIS — E1122 Type 2 diabetes mellitus with diabetic chronic kidney disease: Secondary | ICD-10-CM | POA: Diagnosis not present

## 2020-05-18 DIAGNOSIS — E872 Acidosis: Secondary | ICD-10-CM | POA: Diagnosis not present

## 2020-07-07 ENCOUNTER — Ambulatory Visit: Payer: Medicare Other

## 2020-07-07 ENCOUNTER — Ambulatory Visit (INDEPENDENT_AMBULATORY_CARE_PROVIDER_SITE_OTHER): Payer: Medicare Other | Admitting: *Deleted

## 2020-07-07 VITALS — BP 150/70 | Ht 70.0 in | Wt 163.0 lb

## 2020-07-07 DIAGNOSIS — Z0000 Encounter for general adult medical examination without abnormal findings: Secondary | ICD-10-CM | POA: Diagnosis not present

## 2020-07-07 NOTE — Progress Notes (Signed)
MEDICARE ANNUAL WELLNESS VISIT  07/07/2020  Telephone Visit Disclaimer This Medicare AWV was conducted by telephone due to national recommendations for restrictions regarding the COVID-19 Pandemic (e.g. social distancing).  I verified, using two identifiers, that I am speaking with Edward [name] [facility] or their authorized healthcare agent. I discussed the limitations, risks, security, and privacy concerns of performing an evaluation and management service by telephone and the potential availability of an in-person appointment in the future. The patient expressed understanding and agreed to proceed.  Location of Patient: in his home Location of Provider (nurse):  In office  Subjective:    Edward [name] is a 70 y.o. male patient of [name], [place] who had a Medicare Annual Wellness Visit today via telephone. Edward [name] is Retired and lives alone. he has 2 children. he reports that he is socially active and does interact with friends/family regularly. he is minimally physically active and enjoys watching tv and walking.  Patient Care Team: [name], FNP as PCP - General (Nurse Practitioner) Satira Sark, MD as PCP - Cardiology (Cardiology) Danice Goltz, MD as Consulting Physician (Ophthalmology)  Advanced Directives 07/07/2020 03/28/2020 12/22/2019 11/17/2019 10/14/2019 07/01/2019  Does Patient Have a Medical Advance Directive? No No No No No No  Would patient like information on creating a medical advance directive? No - Patient declined No - Patient declined No - Patient declined No - Patient declined No - Patient declined No - Patient declined    Hospital Utilization Over the Past 12 Months: # of hospitalizations or ER visits: 0 # of surgeries: 0  Review of Systems    Patient reports that his overall health is unchanged compared to last year.  General ROS: negative  Patient Reported Readings (BP, Pulse, CBG, Weight, etc) BP (!) 150/70   Ht 5\' 10"   (1.778 m)   Wt 163 lb (73.9 kg)   BMI 23.39 kg/m    Pain Assessment       Current Medications & Allergies (verified) Allergies as of 07/07/2020   No Known Allergies     Medication List       Accurate as of July 07, 2020  8:49 AM. If you have any questions, ask your nurse or doctor.        Accu-Chek Aviva Plus test strip Generic drug: glucose blood USE TO CHECK BLOOD SUGAR ONCE DAILY   aspirin 81 MG tablet Take 81 mg by mouth daily.   atorvastatin 40 MG tablet Commonly known as: LIPITOR Take 1 tablet (40 mg total) by mouth daily.   B-12 PO Take by mouth.   brimonidine 0.2 % ophthalmic solution Commonly known as: ALPHAGAN SMARTSIG:In Eye(s)   fish oil-omega-3 fatty acids 1000 MG capsule Take 1 g by mouth 2 (two) times daily.   gemfibrozil 600 MG tablet Commonly known as: LOPID TAKE 1 TABLET BY MOUTH TWICE DAILY BEFORE MEAL(S)   glipiZIDE 10 MG tablet Commonly known as: GLUCOTROL TAKE 1 TABLET BY MOUTH TWICE DAILY BEFORE MEAL(S)   IRON PO Take by mouth.   isosorbide mononitrate 30 MG 24 hr tablet Commonly known as: IMDUR Take 1 tablet (30 mg total) by mouth daily. New 01/30/2018   lisinopril 10 MG tablet Commonly known as: ZESTRIL Take 1 tablet (10 mg total) by mouth daily.   metFORMIN 1000 MG tablet Commonly known as: GLUCOPHAGE Take 0.5 tablets (500 mg total) by mouth 2 (two) times daily with a meal. TAKE 1/2 (ONE-HALF) TABLET BY MOUTH IN THE MORNING AND  1 IN THE EVENING   metoprolol tartrate 25 MG tablet Commonly known as: LOPRESSOR Take 1 tablet (25 mg total) by mouth 2 (two) times daily.   nitroGLYCERIN 0.4 MG SL tablet Commonly known as: NITROSTAT Place 1 tablet (0.4 mg total) under the tongue every 5 (five) minutes x 3 doses as needed for chest pain (if no relief after 2nd dose, proceed to the ED for an evaluation).   pantoprazole 40 MG tablet Commonly known as: Protonix Take 1 tablet (40 mg total) by mouth daily.   traZODone 100 MG  tablet Commonly known as: DESYREL Take 1 tablet (100 mg total) by mouth at bedtime.   Vitamin D3 50 MCG (2000 UT) Tabs Take 1 tablet by mouth daily.       History (reviewed): Past Medical History:  Diagnosis Date  . Cataract   . CKD (chronic kidney disease) stage 3, GFR 30-59 ml/min (HCC)   . Coronary atherosclerosis of native coronary artery    BMS LAD 2006; residual 75% distal RCA; EF 50%  . Essential hypertension   . Glaucoma   . Low back pain   . MI (myocardial infarction) ([place])    Anterior 2006  . Mixed hyperlipidemia   . Renal insufficiency   . Type 2 diabetes mellitus ([place])    Past Surgical History:  Procedure Laterality Date  . CATARACT EXTRACTION, BILATERAL    . CORONARY ANGIOPLASTY WITH STENT PLACEMENT  2006   Family History  Problem Relation Age of Onset  . Aneurysm Father        Brain aneurysm  . Lung cancer Mother        Small cell carcinoma of lung,kidney and heart  . Heart disease Mother   . Cancer Brother        lungs  . Diabetes Sister   . Diabetes Sister   . Diabetes Brother   . Diabetes Brother    Social History   Socioeconomic History  . Marital status: Legally Separated    Spouse name: [name]   . Number of children: 2  . Years of education: 7th  . Highest education level: 7th grade  Occupational History  . Occupation: Retired  Tobacco Use  . Smoking status: Former Smoker    Packs/day: 2.00    Years: 20.00    Pack years: 40.00    Types: Cigarettes    Quit date: 05/08/1987    Years since quitting: 33.1  . Smokeless tobacco: Never Used  Vaping Use  . Vaping Use: Never used  Substance and Sexual Activity  . Alcohol use: No    Alcohol/week: 0.0 standard drinks  . Drug use: No  . Sexual activity: Not Currently  Other Topics Concern  . Not on file  Social History Narrative   Lives alone    Social Determinants of Health   Financial Resource Strain: Not on file  Food Insecurity: Not on file  Transportation Needs: Not on file   Physical Activity: Not on file  Stress: Not on file  Social Connections: Not on file    Activities of Daily Living In your present state of health, do you have any difficulty performing the following activities: 07/07/2020  Hearing? N  Vision? N  Difficulty concentrating or making decisions? N  Walking or climbing stairs? Y  Dressing or bathing? N  Doing errands, shopping? N  Preparing Food and eating ? N  Using the Toilet? N  In the past six months, have you accidently leaked urine? N  Do  you have problems with loss of bowel control? N  Managing your Medications? N  Managing your Finances? N  Housekeeping or managing your Housekeeping? N  Some recent data might be hidden    Patient Education/ Literacy    Exercise Current Exercise Habits: The patient does not participate in regular exercise at present, Exercise limited by: None identified  Diet Patient reports consuming 3 meals a day and 2 snack(s) a day Patient reports that his primary diet is: Regular Patient reports that she does have regular access to food.   Depression Screen PHQ 2/9 Scores 07/07/2020 04/28/2020 01/15/2020 10/02/2019 07/01/2019 03/20/2019 12/16/2018  PHQ - 2 Score 0 0 0 0 0 0 0  PHQ- 9 Score - - - - - - -     Fall Risk Fall Risk  07/07/2020 04/28/2020 01/15/2020 10/02/2019 07/01/2019  Falls in the past year? 0 0 0 0 0     Objective:  [facility] Per[facility] seemed alert and oriented and he participated appropriately during our telephone visit.  Blood Pressure Weight BMI  BP Readings from Last 3 Encounters:  07/07/20 (!) 150/70  04/28/20 119/61  04/15/20 (!) 108/50   Wt Readings from Last 3 Encounters:  07/07/20 163 lb (73.9 kg)  04/28/20 163 lb (73.9 kg)  04/15/20 160 lb 6.4 oz (72.8 kg)   BMI Readings from Last 1 Encounters:  07/07/20 23.39 kg/m    *Unable to obtain current vital signs, weight, and BMI due to telephone visit type  Hearing/Vision  . Tu did not seem to have difficulty with  hearing/understanding during the telephone conversation . Reports that he has not had a formal eye exam by an eye care professional within the past year . Reports that he has not had a formal hearing evaluation within the past year *Unable to fully assess hearing and vision during telephone visit type  Cognitive Function: 6CIT Screen 07/07/2020 07/01/2019  What Year? 0 points 0 points  What month? 0 points 0 points  What time? 0 points 0 points  Count back from 20 0 points 0 points  Months in reverse 0 points 4 points  Repeat phrase 0 points 10 points  Total Score 0 14   (Normal:0-7, Significant for Dysfunction: >8)  Normal Cognitive Function Screening: Yes   Immunization & Health Maintenance Record Immunization History  Administered Date(s) Administered  . Influenza, High Dose Seasonal PF 02/03/2018, 01/20/2019  . Influenza,inj,Quad PF,6+ Mos 03/08/2014, 04/18/2015, 03/05/2016, 01/28/2017  . Influenza-Unspecified 01/25/2019, 03/01/2020  . Moderna Sars-Covid-2 Vaccination 03/11/2020  . PFIZER(Purple Top)SARS-COV-2 Vaccination 07/03/2019, 07/31/2019  . Pneumococcal Conjugate-13 08/05/2015  . Pneumococcal Polysaccharide-23 10/19/2017  . Td 05/07/2004  . Tdap 01/13/2015    Health Maintenance  Topic Date Due  . OPHTHALMOLOGY EXAM  07/27/2020 (Originally 08/29/2018)  . HEMOGLOBIN A1C  10/27/2020  . FOOT EXAM  04/28/2021  . COLONOSCOPY (Pts 45-3yrs Insurance coverage will need to be confirmed)  08/17/2021  . TETANUS/TDAP  01/12/2025  . INFLUENZA VACCINE  Completed  . COVID-19 Vaccine  Completed  . Hepatitis C Screening  Completed  . PNA vac Low Risk Adult  Completed  . HPV VACCINES  Aged Out       Assessment  This is a routine wellness examination for Edward [name].  Health Maintenance: Due or Overdue There are no preventive care reminders to display for this patient.  Edward [name] [place] does not need a referral for Community Assistance: Care Management:   no Social  Work:    no Prescription  Assistance:  no Nutrition/Diabetes Education:  no   Plan:  Personalized Goals Goals Addressed            This Visit's Progress   . AWV   On track    07/01/2019 AWV Goal: Diabetes Management  . Patient will maintain an A1C level below 8.0 . Patient will not develop any diabetic foot complications . Patient will not experience any hypoglycemic episodes over the next 3 months . Patient will notify our office of any CBG readings outside of the provider recommended range by calling [phone] . Patient will adhere to provider recommendations for diabetes management  Patient Self Management Activities . take all medications as prescribed and report any negative side effects . monitor and record blood sugar readings as directed . adhere to a low carbohydrate diet that incorporates lean proteins, vegetables, whole grains, low glycemic fruits . check feet daily noting any sores, cracks, injuries, or callous formations . see PCP or podiatrist if he notices any changes in his legs, feet, or toenails . Patient will visit PCP and have an A1C level checked every 3 to 6 months as directed  . have a yearly eye exam to monitor for vascular changes associated with diabetes and will request that the report be sent to his pcp.  . consult with his PCP regarding any changes in his health or new or worsening symptoms   07/01/2019 AWV Goal: Exercise for General Health   Patient will verbalize understanding of the benefits of increased physical activity:  Exercising regularly is important. It will improve your overall fitness, flexibility, and endurance.  Regular exercise also will improve your overall health. It can help you control your weight, reduce stress, and improve your bone density.  Over the next year, patient will increase physical activity as tolerated with a goal of at least 150 minutes of moderate physical activity per week.   You can tell that you are  exercising at a moderate intensity if your heart starts beating faster and you start breathing faster but can still hold a conversation.  Moderate-intensity exercise ideas include:  Walking 1 mile (1.6 km) in about 15 minutes  Biking  Hiking  Golfing  Dancing  Water aerobics  Patient will verbalize understanding of everyday activities that increase physical activity by providing examples like the following: ? Yard work, such as: ? Pushing a Conservation officer, nature ? Raking and bagging leaves ? Washing your car ? Pushing a stroller ? Shoveling snow ? Gardening ? Washing windows or floors  Patient will be able to explain general safety guidelines for exercising:   Before you start a new exercise program, talk with your health care provider.  Do not exercise so much that you hurt yourself, feel dizzy, or get very short of breath.  Wear comfortable clothes and wear shoes with good support.  Drink plenty of water while you exercise to prevent dehydration or heat stroke.  Work out until your breathing and your heartbeat get faster.     . Prevent falls       Stay active       Personalized Health Maintenance & Screening Recommendations  up to date  Lung Cancer Screening Recommended: no (Low Dose CT Chest recommended if Age 40-80 years, 30 pack-year currently smoking OR have quit w/in past 15 years) Hepatitis C Screening recommended: no HIV Screening recommended: no  Advanced Directives: Written information was not prepared per patient's request.  Referrals & Orders No orders of the defined types were placed  in this encounter.   Follow-up Plan . Follow-up with [name], FNP as planned   I have personally reviewed and noted the following in the patient's chart:   . Medical and social history . Use of alcohol, tobacco or illicit drugs  . Current medications and supplements . Functional ability and status . Nutritional status . Physical activity . Advanced  directives . List of other physicians . Hospitalizations, surgeries, and ER visits in previous 12 months . Vitals . Screenings to include cognitive, depression, and falls . Referrals and appointments  In addition, I have reviewed and discussed with Edward [name] [facility] certain preventive protocols, quality metrics, and best practice recommendations. A written personalized care plan for preventive services as well as general preventive health recommendations is available and can be mailed to the patient at his request.      [place] Dec  07/07/2020

## 2020-07-12 DIAGNOSIS — N189 Chronic kidney disease, unspecified: Secondary | ICD-10-CM | POA: Diagnosis not present

## 2020-07-12 DIAGNOSIS — E1129 Type 2 diabetes mellitus with other diabetic kidney complication: Secondary | ICD-10-CM | POA: Diagnosis not present

## 2020-07-12 DIAGNOSIS — D696 Thrombocytopenia, unspecified: Secondary | ICD-10-CM | POA: Diagnosis not present

## 2020-07-12 DIAGNOSIS — E872 Acidosis: Secondary | ICD-10-CM | POA: Diagnosis not present

## 2020-07-12 DIAGNOSIS — R809 Proteinuria, unspecified: Secondary | ICD-10-CM | POA: Diagnosis not present

## 2020-07-12 DIAGNOSIS — D631 Anemia in chronic kidney disease: Secondary | ICD-10-CM | POA: Diagnosis not present

## 2020-07-12 DIAGNOSIS — E1122 Type 2 diabetes mellitus with diabetic chronic kidney disease: Secondary | ICD-10-CM | POA: Diagnosis not present

## 2020-07-21 DIAGNOSIS — E872 Acidosis: Secondary | ICD-10-CM | POA: Diagnosis not present

## 2020-07-21 DIAGNOSIS — I952 Hypotension due to drugs: Secondary | ICD-10-CM | POA: Diagnosis not present

## 2020-07-21 DIAGNOSIS — N189 Chronic kidney disease, unspecified: Secondary | ICD-10-CM | POA: Diagnosis not present

## 2020-07-21 DIAGNOSIS — D696 Thrombocytopenia, unspecified: Secondary | ICD-10-CM | POA: Diagnosis not present

## 2020-07-21 DIAGNOSIS — D631 Anemia in chronic kidney disease: Secondary | ICD-10-CM | POA: Diagnosis not present

## 2020-07-21 DIAGNOSIS — R809 Proteinuria, unspecified: Secondary | ICD-10-CM | POA: Diagnosis not present

## 2020-07-21 DIAGNOSIS — E1122 Type 2 diabetes mellitus with diabetic chronic kidney disease: Secondary | ICD-10-CM | POA: Diagnosis not present

## 2020-08-05 ENCOUNTER — Ambulatory Visit: Payer: Self-pay | Admitting: Nurse Practitioner

## 2020-08-08 ENCOUNTER — Other Ambulatory Visit: Payer: Self-pay

## 2020-08-08 ENCOUNTER — Inpatient Hospital Stay (HOSPITAL_COMMUNITY): Payer: Medicare Other | Attending: Hematology

## 2020-08-08 DIAGNOSIS — I252 Old myocardial infarction: Secondary | ICD-10-CM | POA: Insufficient documentation

## 2020-08-08 DIAGNOSIS — N183 Chronic kidney disease, stage 3 unspecified: Secondary | ICD-10-CM | POA: Diagnosis not present

## 2020-08-08 DIAGNOSIS — Z7984 Long term (current) use of oral hypoglycemic drugs: Secondary | ICD-10-CM | POA: Diagnosis not present

## 2020-08-08 DIAGNOSIS — I2510 Atherosclerotic heart disease of native coronary artery without angina pectoris: Secondary | ICD-10-CM | POA: Insufficient documentation

## 2020-08-08 DIAGNOSIS — E1136 Type 2 diabetes mellitus with diabetic cataract: Secondary | ICD-10-CM | POA: Insufficient documentation

## 2020-08-08 DIAGNOSIS — D509 Iron deficiency anemia, unspecified: Secondary | ICD-10-CM | POA: Insufficient documentation

## 2020-08-08 DIAGNOSIS — Z87891 Personal history of nicotine dependence: Secondary | ICD-10-CM | POA: Insufficient documentation

## 2020-08-08 DIAGNOSIS — D696 Thrombocytopenia, unspecified: Secondary | ICD-10-CM

## 2020-08-08 DIAGNOSIS — D631 Anemia in chronic kidney disease: Secondary | ICD-10-CM | POA: Diagnosis not present

## 2020-08-08 DIAGNOSIS — Z7982 Long term (current) use of aspirin: Secondary | ICD-10-CM | POA: Diagnosis not present

## 2020-08-08 DIAGNOSIS — Z801 Family history of malignant neoplasm of trachea, bronchus and lung: Secondary | ICD-10-CM | POA: Diagnosis not present

## 2020-08-08 DIAGNOSIS — E782 Mixed hyperlipidemia: Secondary | ICD-10-CM | POA: Insufficient documentation

## 2020-08-08 DIAGNOSIS — E1122 Type 2 diabetes mellitus with diabetic chronic kidney disease: Secondary | ICD-10-CM | POA: Insufficient documentation

## 2020-08-08 DIAGNOSIS — I129 Hypertensive chronic kidney disease with stage 1 through stage 4 chronic kidney disease, or unspecified chronic kidney disease: Secondary | ICD-10-CM | POA: Insufficient documentation

## 2020-08-08 DIAGNOSIS — Z79899 Other long term (current) drug therapy: Secondary | ICD-10-CM | POA: Diagnosis not present

## 2020-08-08 LAB — CBC WITH DIFFERENTIAL/PLATELET
Abs Immature Granulocytes: 0.01 10*3/uL (ref 0.00–0.07)
Basophils Absolute: 0 10*3/uL (ref 0.0–0.1)
Basophils Relative: 0 %
Eosinophils Absolute: 0.1 10*3/uL (ref 0.0–0.5)
Eosinophils Relative: 3 %
HCT: 30.9 % — ABNORMAL LOW (ref 39.0–52.0)
Hemoglobin: 10.2 g/dL — ABNORMAL LOW (ref 13.0–17.0)
Immature Granulocytes: 0 %
Lymphocytes Relative: 34 %
Lymphs Abs: 1.2 10*3/uL (ref 0.7–4.0)
MCH: 34.6 pg — ABNORMAL HIGH (ref 26.0–34.0)
MCHC: 33 g/dL (ref 30.0–36.0)
MCV: 104.7 fL — ABNORMAL HIGH (ref 80.0–100.0)
Monocytes Absolute: 0.3 10*3/uL (ref 0.1–1.0)
Monocytes Relative: 10 %
Neutro Abs: 1.8 10*3/uL (ref 1.7–7.7)
Neutrophils Relative %: 53 %
Platelets: 71 10*3/uL — ABNORMAL LOW (ref 150–400)
RBC: 2.95 MIL/uL — ABNORMAL LOW (ref 4.22–5.81)
RDW: 13.7 % (ref 11.5–15.5)
WBC: 3.5 10*3/uL — ABNORMAL LOW (ref 4.0–10.5)
nRBC: 0 % (ref 0.0–0.2)

## 2020-08-08 LAB — IRON AND TIBC
Iron: 76 ug/dL (ref 45–182)
Saturation Ratios: 21 % (ref 17.9–39.5)
TIBC: 364 ug/dL (ref 250–450)
UIBC: 288 ug/dL

## 2020-08-08 LAB — FERRITIN: Ferritin: 60 ng/mL (ref 24–336)

## 2020-08-15 ENCOUNTER — Other Ambulatory Visit: Payer: Self-pay

## 2020-08-15 ENCOUNTER — Inpatient Hospital Stay (HOSPITAL_BASED_OUTPATIENT_CLINIC_OR_DEPARTMENT_OTHER): Payer: Medicare Other | Admitting: Physician Assistant

## 2020-08-15 VITALS — BP 115/58 | HR 63 | Temp 97.2°F | Resp 18 | Wt 159.8 lb

## 2020-08-15 DIAGNOSIS — D696 Thrombocytopenia, unspecified: Secondary | ICD-10-CM | POA: Diagnosis not present

## 2020-08-15 DIAGNOSIS — I129 Hypertensive chronic kidney disease with stage 1 through stage 4 chronic kidney disease, or unspecified chronic kidney disease: Secondary | ICD-10-CM | POA: Diagnosis not present

## 2020-08-15 DIAGNOSIS — D509 Iron deficiency anemia, unspecified: Secondary | ICD-10-CM | POA: Diagnosis not present

## 2020-08-15 DIAGNOSIS — E1136 Type 2 diabetes mellitus with diabetic cataract: Secondary | ICD-10-CM | POA: Diagnosis not present

## 2020-08-15 DIAGNOSIS — E1122 Type 2 diabetes mellitus with diabetic chronic kidney disease: Secondary | ICD-10-CM | POA: Diagnosis not present

## 2020-08-15 DIAGNOSIS — N183 Chronic kidney disease, stage 3 unspecified: Secondary | ICD-10-CM | POA: Diagnosis not present

## 2020-08-15 NOTE — Patient Instructions (Signed)
[place] at [facility] Discharge Instructions  You were seen today by Tarri Abernethy PA-C for your anemia (low blood count) and thrombocytopenia (low platelets).    LABS: Return in 4 months for repeat labs   OTHER TESTS: We may need to do a bone marrow biopsy in the future, if your blood counts keep dropping  MEDICATIONS: Start taking your iron pill TWICE per day (instead of just once daily)  FOLLOW-UP APPOINTMENT: Return in 4 months for follow-up appointment   Thank you for choosing [place] at [facility] to provide your oncology and hematology care.  To afford each patient quality time with our provider, please arrive at least 15 minutes before your scheduled appointment time.   If you have a lab appointment with the [place] please come in thru the Main Entrance and check in at the main information desk.  You need to re-schedule your appointment should you arrive 10 or more minutes late.  We strive to give you quality time with our providers, and arriving late affects you and other patients whose appointments are after yours.  Also, if you no show three or more times for appointments you may be dismissed from the clinic at the providers discretion.     Again, thank you for choosing [facility].  Our hope is that these requests will decrease the amount of time that you wait before being seen by our physicians.       _____________________________________________________________  Should you have questions after your visit to [facility], please contact our office at [phone] and follow the prompts.  Our office hours are 8:00 a.m. and 4:30 p.m. Monday - Friday.  Please note that voicemails left after 4:00 p.m. may not be returned until the following business day.  We are closed weekends and major holidays.  You do have access to a nurse 24-7, just call the main number to the clinic [phone] and do  not press any options, hold on the line and a nurse will answer the phone.    For prescription refill requests, have your pharmacy contact our office and allow 72 hours.    Due to Covid, you will need to wear a mask upon entering the hospital. If you do not have a mask, a mask will be given to you at the Main Entrance upon arrival. For doctor visits, patients may have 1 support person age 37 or older with them. For treatment visits, patients can not have anyone with them due to social distancing guidelines and our immunocompromised population.

## 2020-08-15 NOTE — Progress Notes (Signed)
[place] [place], [place] [address]   CLINIC:  Medical Oncology/Hematology  PCP:  [name], [address] [place] [id number] [phone]   REASON FOR VISIT:  Follow-up for thrombocytopenia and macrocytic anemia  PRIOR THERAPY: None  CURRENT THERAPY: Observation  INTERVAL HISTORY:  [name] 70 y.o. male returns for routine follow-up of his thrombocytopenia and anemia.  He was last seen by Dr. Delton Coombes on 03/28/2020.  At the time of his last visit, he had noted some dark-colored stools, but reports that this has not recurred.  He says that his stools are occasionally dark brown, but denies tarry black stools.  He reports easy bruising in the setting of thrombocytopenia, but denies any recent bleeding such as epistaxis, hematuria, hematochezia, or melena.  The patient denies over the counter NSAID ingestion. He take 81 mg Aspirin, but denies other antiplatelets agents.  His last colonoscopy was about 10 years ago, positive for polyps but otherwise reported to be normal. He denies any pica and eats a variety of diet. He never donated blood or received blood transfusion The patient was prescribed oral iron supplements and he takes ferrous sulfate 325 mg once daily, as well as B12 supplement.  [name] does report intermittent chest pains described as "a little twitch and smothering spells" with exertion.   He had a cardiac catheterization in 1996 - reports that he had stents placed, but had some blocked vessels that were too small to have stents placed.  He continues to follow with a cardiologist every 6 months.    He denies shortness of breath on minimal exertion, pre-syncopal episodes, or palpitations. He denies B symptoms. He reports ongoing mild fatigue that is unchanged.  Fatigue is no worse than usual.  He has 75% energy and 75% appetite. He endorses that he is maintaining a stable weight.     REVIEW OF SYSTEMS:   Review of Systems  Constitutional: Positive for fatigue (75%). Negative for appetite change, chills, diaphoresis, fever and unexpected weight change.  HENT:   Negative for lump/mass and nosebleeds.   Eyes: Negative for eye problems.  Respiratory: Negative for cough, hemoptysis and shortness of breath.   Cardiovascular: Positive for chest pain (See HPI). Negative for leg swelling and palpitations.  Gastrointestinal: Positive for diarrhea (Chronic since starting Metformin). Negative for abdominal pain, blood in stool, constipation, nausea and vomiting.  Genitourinary: Negative for hematuria.   Skin: Negative.   Neurological: Negative for dizziness, headaches and light-headedness.  Hematological: Bruises/bleeds easily.      PAST MEDICAL/SURGICAL HISTORY:  Past Medical History:  Diagnosis Date  . Cataract   . CKD (chronic kidney disease) stage 3, GFR 30-59 ml/min (HCC)   . Coronary atherosclerosis of native coronary artery    BMS LAD 2006; residual 75% distal RCA; EF 50%  . Essential hypertension   . Glaucoma   . Low back pain   . MI (myocardial infarction) ([place])    Anterior 2006  . Mixed hyperlipidemia   . Renal insufficiency   . Type 2 diabetes mellitus ([place])    Past Surgical History:  Procedure Laterality Date  . CATARACT EXTRACTION, BILATERAL    . CORONARY ANGIOPLASTY WITH STENT PLACEMENT  2006     SOCIAL HISTORY:  Social History   Socioeconomic History  . Marital status: Legally Separated    Spouse name: [name]   . Number of children: 2  . Years of education: 7th  . Highest education level: 7th  grade  Occupational History  . Occupation: Retired  Tobacco Use  . Smoking status: Former Smoker    Packs/day: 2.00    Years: 20.00    Pack years: 40.00    Types: Cigarettes    Quit date: 05/08/1987    Years since quitting: 33.2  . Smokeless tobacco: Never Used  Vaping Use  . Vaping Use: Never used  Substance and Sexual Activity  . Alcohol use: No    Alcohol/week:  0.0 standard drinks  . Drug use: No  . Sexual activity: Not Currently  Other Topics Concern  . Not on file  Social History Narrative   Lives alone    Social Determinants of Health   Financial Resource Strain: Not on file  Food Insecurity: Not on file  Transportation Needs: Not on file  Physical Activity: Not on file  Stress: Not on file  Social Connections: Not on file  Intimate Partner Violence: Not on file    FAMILY HISTORY:  Family History  Problem Relation Age of Onset  . Aneurysm Father        Brain aneurysm  . Lung cancer Mother        Small cell carcinoma of lung,kidney and heart  . Heart disease Mother   . Cancer Brother        lungs  . Diabetes Sister   . Diabetes Sister   . Diabetes Brother   . Diabetes Brother     CURRENT MEDICATIONS:  Outpatient Encounter Medications as of 08/15/2020  Medication Sig  . ACCU-CHEK AVIVA PLUS test strip USE TO CHECK BLOOD SUGAR ONCE DAILY  . aspirin 81 MG tablet Take 81 mg by mouth daily.  [name] atorvastatin (LIPITOR) 40 MG tablet Take 1 tablet (40 mg total) by mouth daily.  . brimonidine (ALPHAGAN) 0.2 % ophthalmic solution SMARTSIG:In Eye(s)  . Cholecalciferol (VITAMIN D3) 2000 UNITS TABS Take 1 tablet by mouth daily.  . Cyanocobalamin (B-12 PO) Take by mouth.  . Ferrous Sulfate (IRON PO) Take by mouth.  . fish oil-omega-3 fatty acids 1000 MG capsule Take 1 g by mouth 2 (two) times daily.  [name] gemfibrozil (LOPID) 600 MG tablet TAKE 1 TABLET BY MOUTH TWICE DAILY BEFORE MEAL(S)  . glipiZIDE (GLUCOTROL) 10 MG tablet TAKE 1 TABLET BY MOUTH TWICE DAILY BEFORE MEAL(S)  . isosorbide mononitrate (IMDUR) 30 MG 24 hr tablet Take 1 tablet (30 mg total) by mouth daily. New 01/30/2018  . lisinopril (ZESTRIL) 10 MG tablet Take 1 tablet (10 mg total) by mouth daily.  . metFORMIN (GLUCOPHAGE) 1000 MG tablet Take 0.5 tablets (500 mg total) by mouth 2 (two) times daily with a meal. TAKE 1/2 (ONE-HALF) TABLET BY MOUTH IN THE MORNING AND 1 IN THE  EVENING  . metoprolol tartrate (LOPRESSOR) 25 MG tablet Take 1 tablet (25 mg total) by mouth 2 (two) times daily.  . nitroGLYCERIN (NITROSTAT) 0.4 MG SL tablet Place 1 tablet (0.4 mg total) under the tongue every 5 (five) minutes x 3 doses as needed for chest pain (if no relief after 2nd dose, proceed to the ED for an evaluation). (Patient not taking: Reported on 07/07/2020)  . pantoprazole (PROTONIX) 40 MG tablet Take 1 tablet (40 mg total) by mouth daily.  . traZODone (DESYREL) 100 MG tablet Take 1 tablet (100 mg total) by mouth at bedtime.   No facility-administered encounter medications on file as of 08/15/2020.    ALLERGIES:  No Known Allergies   PHYSICAL EXAM:  ECOG PERFORMANCE STATUS: 1 - Symptomatic  but completely ambulatory  There were no vitals filed for this visit. There were no vitals filed for this visit. Physical Exam Constitutional:      Appearance: Normal appearance.  HENT:     Head: Normocephalic and atraumatic.     Mouth/Throat:     Mouth: Mucous membranes are moist.  Eyes:     Extraocular Movements: Extraocular movements intact.     Pupils: Pupils are equal, round, and reactive to light.  Cardiovascular:     Rate and Rhythm: Normal rate and regular rhythm.     Pulses: Normal pulses.     Heart sounds: Normal heart sounds.  Pulmonary:     Effort: Pulmonary effort is normal.     Breath sounds: Normal breath sounds.  Abdominal:     General: Bowel sounds are normal.     Palpations: Abdomen is soft.     Tenderness: There is no abdominal tenderness.  Musculoskeletal:        General: No swelling.     Right lower leg: No edema.     Left lower leg: No edema.  Lymphadenopathy:     Cervical: No cervical adenopathy.  Skin:    General: Skin is warm and dry.  Neurological:     General: No focal deficit present.     Mental Status: He is alert and oriented to person, place, and time.  Psychiatric:        Mood and Affect: Mood normal.        Behavior: Behavior  normal.      LABORATORY DATA:  I have reviewed the labs as listed.  CBC    Component Value Date/Time   WBC 3.5 (L) 08/08/2020 1313   RBC 2.95 (L) 08/08/2020 1313   HGB 10.2 (L) 08/08/2020 1313   HGB 11.1 (L) 04/28/2020 0821   HCT 30.9 (L) 08/08/2020 1313   HCT 32.8 (L) 04/28/2020 0821   PLT 71 (L) 08/08/2020 1313   PLT 75 (LL) 04/28/2020 0821   MCV 104.7 (H) 08/08/2020 1313   MCV 101 (H) 04/28/2020 0821   MCH 34.6 (H) 08/08/2020 1313   MCHC 33.0 08/08/2020 1313   RDW 13.7 08/08/2020 1313   RDW 12.8 04/28/2020 0821   LYMPHSABS 1.2 08/08/2020 1313   LYMPHSABS 1.0 04/28/2020 0821   MONOABS 0.3 08/08/2020 1313   EOSABS 0.1 08/08/2020 1313   EOSABS 0.0 04/28/2020 0821   BASOSABS 0.0 08/08/2020 1313   BASOSABS 0.0 04/28/2020 0821   CMP Latest Ref Rng & Units 05/02/2020 04/28/2020 01/15/2020  Glucose 65 - 99 mg/dL 127(H) 51(L) 54(L)  BUN 8 - 27 mg/dL 36(H) 36(H) 28(H)  Creatinine 0.76 - 1.27 mg/dL 2.48(H) 2.71(H) 1.53(H)  Sodium 134 - 144 mmol/L 140 135 138  Potassium 3.5 - 5.2 mmol/L 5.1 6.1(HH) 4.8  Chloride 96 - 106 mmol/L 108(H) 103 105  CO2 20 - 29 mmol/L 17(L) 12(L) 18(L)  Calcium 8.6 - 10.2 mg/dL 9.2 9.4 9.4  Total Protein 6.0 - 8.5 g/dL - 6.4 6.4  Total Bilirubin 0.0 - 1.2 mg/dL - 0.4 0.4  Alkaline Phos 44 - 121 IU/L - 56 57  AST 0 - 40 IU/L - 30 38  ALT 0 - 44 IU/L - 15 22    DIAGNOSTIC IMAGING:  I have independently reviewed the relevant imaging and discussed with the patient.  ASSESSMENT: 1. Moderate thrombocytopenia: -Patient has had thrombocytopenia noted since May 2021 -Has easy bruising on the upper extremities but no active bleeding.  -He does not have any  B symptoms. Denies any history of hepatitis or history of blood transfusion. -Physical exam reveals palpable liver edge. No palpable splenomegaly.  No abdominal imaging on file.  LFTs within normal limits. -Hepatitis panel checked in 2021 was negative; patient was POSITIVE for H. Pylori IgG  antibodies; copper checked in June 2021 mildly low at 62; B12 and folate within normal limits -Differential diagnosis includes immune mediated thrombocytopenia versus MDS.   -Platelet count on 08/08/2020 is 71, continuing to trend slowly downwards  2.Macrocytic anemia with iron deficiency: -Hemoglobin over the past 12 months ranges from 10.2 - 12.2, with macrocytosis -Labs reviewed from 08/08/2020 shows Hgb 10.2, MCV 104.7 -Combination anemia from CKD and relative iron deficiency.  May be an element of possible early MDS (mild leukopenia with WBC 3.5, persistent thrombocytopenia with platelets 71) -Iron panel from 08/08/2020 shows ferritin 60, serum iron 76, iron saturation 21% -SPEP in June 2021 was normal -Denies any signs or symptoms of bleeding -He is tolerating iron tablet daily very well.  3. CKD stage IIIb: -He has CKD with creatinine ranging from 1.4-1.8. -Renal ultrasound on 02/07/2016 shows mild bilateral lobular renal contour. Scarring cannot be excluded. No mass or hydronephrosis. -Patient follows with Dr. Theador Hawthorne, last seen by nephrology 07/21/2020   PLAN:  1. Moderate thrombocytopenia: -Differential diagnosis includes immune mediated thrombocytopenia versus early MDS. -Platelet count on 08/08/2020 is 71, trending slowly downwards -No active bleeding.  Recommend follow-up in 4 months with repeat labs (CBC, copper, B12, folate) -Discussed possible early MDS with patient, but he would like to wait on bone marrow biopsy for now -we will defer bone marrow biopsy and continue to watch blood counts closely -If patient continues to have decreasing platelet count, consider additional work-up such as abdominal imaging and bone marrow biopsy at next visit  2.Macrocytic anemia: -Combination anemia from CKD and relative iron deficiency.  May be an element of possible early MDS (mild leukopenia with WBC 3.5, persistent thrombocytopenia with platelets 71) -Patient declined IV iron, would  like to continue oral iron - we we will increase to twice daily ferrous sulfate, if tolerated -RTC 4 months with repeat labs (CBC, iron panel, reticulocytes, erythropoietin, LDH) -We will consider possible bone marrow biopsy, pending repeat labs in 4 months, given the patient's macrocytic anemia and persistent thrombocytopenia.  Bone marrow biopsy was discussed during today's visit, patient would like to wait on that for now.  3. CKD stage IIIb: -Continue to follow with nephrology (Dr. Theador Hawthorne)   PLAN SUMMARY & DISPOSITION: -RTC in 4 months for labs (CBC, CMP, LDH, B12, reticulocytes, erythropoietin, iron panel, folate, copper) and follow-up visit -Increase ferrous sulfate to TWICE daily  All questions were answered. The patient knows to call the clinic with any problems, questions or concerns.  Medical decision making: Moderate (chronic illness with exacerbation, review of prior results, ordering new tests)  Time spent on visit: I spent 25 minutes counseling the patient face to face. The total time spent in the appointment was 30 minutes and more than 50% was on counseling.   Harriett Rush, PA-C  08/15/20 12:39 PM

## 2020-08-16 ENCOUNTER — Ambulatory Visit (INDEPENDENT_AMBULATORY_CARE_PROVIDER_SITE_OTHER): Payer: Medicare Other | Admitting: Nurse Practitioner

## 2020-08-16 ENCOUNTER — Encounter: Payer: Self-pay | Admitting: Nurse Practitioner

## 2020-08-16 ENCOUNTER — Telehealth: Payer: Self-pay

## 2020-08-16 VITALS — BP 120/74 | HR 61 | Temp 98.6°F | Resp 20 | Ht 70.0 in | Wt 158.0 lb

## 2020-08-16 DIAGNOSIS — I152 Hypertension secondary to endocrine disorders: Secondary | ICD-10-CM | POA: Diagnosis not present

## 2020-08-16 DIAGNOSIS — E782 Mixed hyperlipidemia: Secondary | ICD-10-CM

## 2020-08-16 DIAGNOSIS — K219 Gastro-esophageal reflux disease without esophagitis: Secondary | ICD-10-CM | POA: Insufficient documentation

## 2020-08-16 DIAGNOSIS — E1122 Type 2 diabetes mellitus with diabetic chronic kidney disease: Secondary | ICD-10-CM | POA: Diagnosis not present

## 2020-08-16 DIAGNOSIS — E1159 Type 2 diabetes mellitus with other circulatory complications: Secondary | ICD-10-CM | POA: Diagnosis not present

## 2020-08-16 DIAGNOSIS — F5101 Primary insomnia: Secondary | ICD-10-CM

## 2020-08-16 DIAGNOSIS — N1832 Chronic kidney disease, stage 3b: Secondary | ICD-10-CM | POA: Diagnosis not present

## 2020-08-16 DIAGNOSIS — I25119 Atherosclerotic heart disease of native coronary artery with unspecified angina pectoris: Secondary | ICD-10-CM | POA: Diagnosis not present

## 2020-08-16 LAB — BAYER DCA HB A1C WAIVED: HB A1C (BAYER DCA - WAIVED): 5.8 % (ref <7.0)

## 2020-08-16 MED ORDER — TRAZODONE HCL 100 MG PO TABS: 100.0000 mg | ORAL_TABLET | Freq: Every day | ORAL | 1 refills | Status: DC

## 2020-08-16 MED ORDER — GLIPIZIDE 5 MG PO TABS: 5.0000 mg | ORAL_TABLET | Freq: Two times a day (BID) | ORAL | 1 refills | Status: DC

## 2020-08-16 MED ORDER — METFORMIN HCL 1000 MG PO TABS: 500.0000 mg | ORAL_TABLET | Freq: Two times a day (BID) | ORAL | 1 refills | Status: DC

## 2020-08-16 MED ORDER — METOPROLOL TARTRATE 25 MG PO TABS: 25.0000 mg | ORAL_TABLET | Freq: Two times a day (BID) | ORAL | 1 refills | Status: DC

## 2020-08-16 MED ORDER — ISOSORBIDE MONONITRATE ER 30 MG PO TB24: 30.0000 mg | ORAL_TABLET | Freq: Every day | ORAL | 1 refills | Status: DC

## 2020-08-16 MED ORDER — GEMFIBROZIL 600 MG PO TABS
ORAL_TABLET | ORAL | 1 refills | Status: DC
Start: 2020-08-16 — End: 2021-02-20

## 2020-08-16 MED ORDER — PANTOPRAZOLE SODIUM 40 MG PO TBEC
40.0000 mg | DELAYED_RELEASE_TABLET | Freq: Every day | ORAL | 0 refills | Status: DC
Start: 2020-08-16 — End: 2020-09-23

## 2020-08-16 MED ORDER — ATORVASTATIN CALCIUM 40 MG PO TABS: 40.0000 mg | ORAL_TABLET | Freq: Every day | ORAL | 1 refills | Status: DC

## 2020-08-16 MED ORDER — LISINOPRIL 2.5 MG PO TABS: 2.5000 mg | ORAL_TABLET | Freq: Every day | ORAL | 1 refills | Status: DC

## 2020-08-16 NOTE — Patient Instructions (Signed)

## 2020-08-16 NOTE — Telephone Encounter (Signed)
Received prescription for Metformin with two different sets of dosing directions, please clarify.

## 2020-08-16 NOTE — Progress Notes (Signed)
Subjective:    Patient ID: Edward [name], male    DOB: Sep 14, 1950, 70 y.o.   MRN: [id number]   Chief Complaint: Medical Management of Chronic Issues    HPI:  1. Hypertension associated with diabetes ([place]) On lisinopril and metoprolol for management. Denies HA, dizziness, vision changes. Does occasionally monitor at home. Runs in the 120's.  Does try to follow a low sodium diet.   BP Readings from Last 3 Encounters:  08/16/20 120/74  08/15/20 (!) 115/58  07/07/20 (!) 150/70    2. Type 2 diabetes mellitus with stage 3b chronic kidney disease, without long-term current use of insulin (HCC) Currently on glipizide, metformin. Does not monitor FBG at home. It has been running in the 50's in the mornings. Does not feel shaky in the mornings. Feeling shaky sometimes throughout the day.     Lab Results  Component Value Date   HGBA1C 6.3 04/28/2020    3. Mixed hyperlipidemia Currently on atorvastatin and gemfibrozil. Tolerates well. Eats what he enjoys.   Lab Results  Component Value Date   CHOL 110 04/28/2020   HDL 24 (L) 04/28/2020   LDLCALC 67 04/28/2020   TRIG 96 04/28/2020   CHOLHDL 4.6 04/28/2020    4. Stage 3b chronic kidney disease ([place]) Follows with nephrology. Denies any decrease in urine output. No swelling, SOB.   5. Atherosclerosis of native coronary artery of native heart with angina pectoris [facility]) Follows with cardiology. Should be following up in June.   6. Primary insomnia On trazadone. Takes every night. Sleeps 4-5 hours per night, but this is normal for him.   7. Gastroesophageal reflux disease without esophagitis Prescribed protonix, takes this daily. He does not believe he has "heart burn" and isnt sure why he is taking it.     Outpatient Encounter Medications as of 08/16/2020  Medication Sig  . ACCU-CHEK AVIVA PLUS test strip USE TO CHECK BLOOD SUGAR ONCE DAILY  . aspirin 81 MG tablet Take 81 mg by mouth daily.  [name] atorvastatin (LIPITOR) 40 MG  tablet Take 1 tablet (40 mg total) by mouth daily.  . brimonidine (ALPHAGAN) 0.2 % ophthalmic solution SMARTSIG:In Eye(s)  . Cholecalciferol (VITAMIN D3) 2000 UNITS TABS Take 1 tablet by mouth daily.  . Cyanocobalamin (B-12 PO) Take by mouth.  . Ferrous Sulfate (IRON PO) Take by mouth.  . fish oil-omega-3 fatty acids 1000 MG capsule Take 1 g by mouth 2 (two) times daily.  [name] gemfibrozil (LOPID) 600 MG tablet TAKE 1 TABLET BY MOUTH TWICE DAILY BEFORE MEAL(S)  . glipiZIDE (GLUCOTROL) 10 MG tablet TAKE 1 TABLET BY MOUTH TWICE DAILY BEFORE MEAL(S)  . lisinopril (ZESTRIL) 2.5 MG tablet Take 2.5 mg by mouth daily.  . metFORMIN (GLUCOPHAGE) 1000 MG tablet Take 0.5 tablets (500 mg total) by mouth 2 (two) times daily with a meal. TAKE 1/2 (ONE-HALF) TABLET BY MOUTH IN THE MORNING AND 1 IN THE EVENING  . metoprolol tartrate (LOPRESSOR) 25 MG tablet Take 1 tablet (25 mg total) by mouth 2 (two) times daily.  . nitroGLYCERIN (NITROSTAT) 0.4 MG SL tablet Place 1 tablet (0.4 mg total) under the tongue every 5 (five) minutes x 3 doses as needed for chest pain (if no relief after 2nd dose, proceed to the ED for an evaluation).  . pantoprazole (PROTONIX) 40 MG tablet Take 1 tablet (40 mg total) by mouth daily.  . sodium bicarbonate 650 MG tablet Take by mouth.  . traZODone (DESYREL) 100 MG tablet Take 1 tablet (  100 mg total) by mouth at bedtime.  . isosorbide mononitrate (IMDUR) 30 MG 24 hr tablet Take 1 tablet (30 mg total) by mouth daily. New 01/30/2018   No facility-administered encounter medications on file as of 08/16/2020.    Past Surgical History:  Procedure Laterality Date  . CATARACT EXTRACTION, BILATERAL    . CORONARY ANGIOPLASTY WITH STENT PLACEMENT  2006    Family History  Problem Relation Age of Onset  . Aneurysm Father        Brain aneurysm  . Lung cancer Mother        Small cell carcinoma of lung,kidney and heart  . Heart disease Mother   . Cancer Brother        lungs  . Diabetes Sister    . Diabetes Sister   . Diabetes Brother   . Diabetes Brother     New complaints: None  Social history: Lives by himself  Controlled substance contract: N/A    Review of Systems  Constitutional: Negative for chills, fatigue and fever.  Respiratory: Negative for cough, shortness of breath and wheezing.   Cardiovascular: Negative for chest pain and palpitations.  Gastrointestinal: Negative for abdominal pain, constipation and diarrhea.  Genitourinary: Negative for difficulty urinating, dysuria, frequency and hematuria.  Neurological: Positive for dizziness and light-headedness. Negative for syncope.  Psychiatric/Behavioral: Negative for confusion. The patient is not nervous/anxious.        Objective:   Physical Exam Neck:     Vascular: No carotid bruit.  Cardiovascular:     Rate and Rhythm: Normal rate and regular rhythm.     Pulses: Normal pulses.     Heart sounds: Normal heart sounds.  Pulmonary:     Effort: Pulmonary effort is normal.     Breath sounds: Normal breath sounds.  Abdominal:     General: Abdomen is flat. Bowel sounds are normal.     Palpations: Abdomen is soft.  Musculoskeletal:        General: Normal range of motion.     Cervical back: Normal range of motion and neck supple.  Skin:    General: Skin is warm and dry.     Capillary Refill: Capillary refill takes less than 2 seconds.  Neurological:     Mental Status: He is alert and oriented to person, place, and time.  Psychiatric:        Mood and Affect: Mood normal.        Behavior: Behavior normal.    Vitals:   08/16/20 1004  BP: 120/74  Pulse: 61  Resp: 20  Temp: 98.6 F (37 C)  SpO2: 100%   Todays A1c 5.8%      Assessment & Plan:   Edward [name] comes in today with chief complaint of Medical Management of Chronic Issues   Diagnosis and orders addressed:  1. Hypertension associated with diabetes ([place]) Continue medications as prescribed. Low sodium diet encouraged.  - CBC with  Differential/Platelet - CMP14+EGFR - lisinopril (ZESTRIL) 2.5 MG tablet; Take 1 tablet (2.5 mg total) by mouth daily.  Dispense: 90 tablet; Refill: 1  2. Type 2 diabetes mellitus with stage 3b chronic kidney disease, without long-term current use of insulin (HCC) Decrease glipizide to $RemoveBefo'5mg'[id number]$  bid. Check FBG daily. Notify office with continued low BG levels.   - Bayer DCA Hb A1c Waived - Microalbumin / creatinine urine ratio - metFORMIN (GLUCOPHAGE) 1000 MG tablet; Take 0.5 tablets (500 mg total) by mouth 2 (two) times daily with a meal. TAKE 1/2 (ONE-HALF)  TABLET BY MOUTH IN THE MORNING AND 1 IN THE EVENING  Dispense: 180 tablet; Refill: 1 - glipiZIDE (GLUCOTROL) 5 MG tablet; Take 1 tablet (5 mg total) by mouth 2 (two) times daily before a meal.  Dispense: 180 tablet; Refill: 1  3. Mixed hyperlipidemia Continue medications as prescribed.   - Lipid panel - atorvastatin (LIPITOR) 40 MG tablet; Take 1 tablet (40 mg total) by mouth daily.  Dispense: 90 tablet; Refill: 1 - gemfibrozil (LOPID) 600 MG tablet; TAKE 1 TABLET BY MOUTH TWICE DAILY BEFORE MEAL(S)  Dispense: 180 tablet; Refill: 1  4. Stage 3b chronic kidney disease [facility]) Following with nephrology.  5. Atherosclerosis of native coronary artery of native heart with angina pectoris [facility]) Following with cardiology.  - isosorbide mononitrate (IMDUR) 30 MG 24 hr tablet; Take 1 tablet (30 mg total) by mouth daily. New 01/30/2018  Dispense: 90 tablet; Refill: 1 - metoprolol tartrate (LOPRESSOR) 25 MG tablet; Take 1 tablet (25 mg total) by mouth 2 (two) times daily.  Dispense: 180 tablet; Refill: 1  6. Primary insomnia Continue trazodone and good sleep habits.   - traZODone (DESYREL) 100 MG tablet; Take 1 tablet (100 mg total) by mouth at bedtime.  Dispense: 90 tablet; Refill: 1  7. Gastroesophageal reflux disease without esophagitis Continue to avoid triggers, eating late at night, and spicy foods. Continue medication as prescribed.   -  pantoprazole (PROTONIX) 40 MG tablet; Take 1 tablet (40 mg total) by mouth daily.  Dispense: 14 tablet; Refill: 0   Labs pending Health Maintenance reviewed Diet and exercise encouraged  Follow up plan: 6 months  Dollene Primrose, RN, BSN, FNP-Student  Mary-Margaret Hassell Done, FNP

## 2020-08-17 LAB — LIPID PANEL
Chol/HDL Ratio: 4 ratio (ref 0.0–5.0)
Cholesterol, Total: 108 mg/dL (ref 100–199)
HDL: 27 mg/dL — ABNORMAL LOW (ref >39)
LDL Chol Calc (NIH): 63 mg/dL (ref 0–99)
Triglycerides: 93 mg/dL (ref 0–149)
VLDL Cholesterol Cal: 18 mg/dL (ref 5–40)

## 2020-08-17 LAB — CBC WITH DIFFERENTIAL/PLATELET
Basophils Absolute: 0 10*3/uL (ref 0.0–0.2)
Basos: 1 %
EOS (ABSOLUTE): 0.1 10*3/uL (ref 0.0–0.4)
Eos: 3 %
Hematocrit: 33.8 % — ABNORMAL LOW (ref 37.5–51.0)
Hemoglobin: 11.1 g/dL — ABNORMAL LOW (ref 13.0–17.7)
Immature Grans (Abs): 0 10*3/uL (ref 0.0–0.1)
Immature Granulocytes: 0 %
Lymphocytes Absolute: 1.4 10*3/uL (ref 0.7–3.1)
Lymphs: 39 %
MCH: 33.9 pg — ABNORMAL HIGH (ref 26.6–33.0)
MCHC: 32.8 g/dL (ref 31.5–35.7)
MCV: 103 fL — ABNORMAL HIGH (ref 79–97)
Monocytes Absolute: 0.3 10*3/uL (ref 0.1–0.9)
Monocytes: 9 %
Neutrophils Absolute: 1.8 10*3/uL (ref 1.4–7.0)
Neutrophils: 48 %
Platelets: 72 10*3/uL — CL (ref 150–450)
RBC: 3.27 x10E6/uL — ABNORMAL LOW (ref 4.14–5.80)
RDW: 13.7 % (ref 11.6–15.4)
WBC: 3.6 10*3/uL (ref 3.4–10.8)

## 2020-08-17 LAB — CMP14+EGFR
ALT: 17 IU/L (ref 0–44)
AST: 31 IU/L (ref 0–40)
Albumin/Globulin Ratio: 1.9 (ref 1.2–2.2)
Albumin: 4.3 g/dL (ref 3.8–4.8)
Alkaline Phosphatase: 53 IU/L (ref 44–121)
BUN/Creatinine Ratio: 10 (ref 10–24)
BUN: 16 mg/dL (ref 8–27)
Bilirubin Total: 0.7 mg/dL (ref 0.0–1.2)
CO2: 21 mmol/L (ref 20–29)
Calcium: 9.5 mg/dL (ref 8.6–10.2)
Chloride: 105 mmol/L (ref 96–106)
Creatinine, Ser: 1.56 mg/dL — ABNORMAL HIGH (ref 0.76–1.27)
Globulin, Total: 2.3 g/dL (ref 1.5–4.5)
Glucose: 86 mg/dL (ref 65–99)
Potassium: 5.4 mmol/L — ABNORMAL HIGH (ref 3.5–5.2)
Sodium: 142 mmol/L (ref 134–144)
Total Protein: 6.6 g/dL (ref 6.0–8.5)
eGFR: 48 mL/min/{1.73_m2} — ABNORMAL LOW (ref >59)

## 2020-08-17 LAB — MICROALBUMIN / CREATININE URINE RATIO
Creatinine, Urine: 92.5 mg/dL
Microalb/Creat Ratio: 13 mg/g creat (ref 0–29)
Microalbumin, Urine: 11.8 ug/mL

## 2020-08-23 ENCOUNTER — Encounter: Payer: Self-pay | Admitting: Family Medicine

## 2020-08-29 DIAGNOSIS — Z23 Encounter for immunization: Secondary | ICD-10-CM | POA: Diagnosis not present

## 2020-09-13 DIAGNOSIS — D631 Anemia in chronic kidney disease: Secondary | ICD-10-CM | POA: Diagnosis not present

## 2020-09-13 DIAGNOSIS — N189 Chronic kidney disease, unspecified: Secondary | ICD-10-CM | POA: Diagnosis not present

## 2020-09-13 DIAGNOSIS — E1122 Type 2 diabetes mellitus with diabetic chronic kidney disease: Secondary | ICD-10-CM | POA: Diagnosis not present

## 2020-09-13 DIAGNOSIS — R809 Proteinuria, unspecified: Secondary | ICD-10-CM | POA: Diagnosis not present

## 2020-09-13 DIAGNOSIS — D696 Thrombocytopenia, unspecified: Secondary | ICD-10-CM | POA: Diagnosis not present

## 2020-09-13 DIAGNOSIS — I952 Hypotension due to drugs: Secondary | ICD-10-CM | POA: Diagnosis not present

## 2020-09-13 DIAGNOSIS — E872 Acidosis: Secondary | ICD-10-CM | POA: Diagnosis not present

## 2020-09-22 ENCOUNTER — Other Ambulatory Visit: Payer: Self-pay | Admitting: Nurse Practitioner

## 2020-09-22 DIAGNOSIS — K219 Gastro-esophageal reflux disease without esophagitis: Secondary | ICD-10-CM

## 2020-09-29 DIAGNOSIS — N189 Chronic kidney disease, unspecified: Secondary | ICD-10-CM | POA: Diagnosis not present

## 2020-09-29 DIAGNOSIS — E872 Acidosis: Secondary | ICD-10-CM | POA: Diagnosis not present

## 2020-09-29 DIAGNOSIS — I952 Hypotension due to drugs: Secondary | ICD-10-CM | POA: Diagnosis not present

## 2020-09-29 DIAGNOSIS — E1122 Type 2 diabetes mellitus with diabetic chronic kidney disease: Secondary | ICD-10-CM | POA: Diagnosis not present

## 2020-09-29 DIAGNOSIS — D696 Thrombocytopenia, unspecified: Secondary | ICD-10-CM | POA: Diagnosis not present

## 2020-09-29 DIAGNOSIS — D631 Anemia in chronic kidney disease: Secondary | ICD-10-CM | POA: Diagnosis not present

## 2020-10-05 ENCOUNTER — Other Ambulatory Visit: Payer: Self-pay | Admitting: Nurse Practitioner

## 2020-10-05 DIAGNOSIS — E782 Mixed hyperlipidemia: Secondary | ICD-10-CM

## 2020-10-05 DIAGNOSIS — N1832 Chronic kidney disease, stage 3b: Secondary | ICD-10-CM

## 2020-10-05 DIAGNOSIS — E1122 Type 2 diabetes mellitus with diabetic chronic kidney disease: Secondary | ICD-10-CM

## 2020-10-05 NOTE — Progress Notes (Signed)
Cardiology Office Note  Date: 10/06/2020   ID: Edward [name], DOB [birth year], MRN [id number]  PCP:  [name], FNP  Cardiologist:  Rozann Lesches, MD Electrophysiologist:  None   Chief Complaint  Patient presents with  . Cardiac follow-up    History of Present Illness: Edward [name] is a 70 y.o. male last seen December 2021.  He is here for a routine visit.  States that he has been doing well, no angina symptoms or nitroglycerin use.  He is following with Dr. Theador Hawthorne, I reviewed the recent office note.  He has CKD stage IV.  We went over his medications which are listed below.  He has had no intolerances, discussed getting a fresh refill for nitroglycerin.  I personally reviewed his ECG today which shows sinus rhythm with PACs, low voltage and decreased R wave progression.  Past Medical History:  Diagnosis Date  . Cataract   . CKD (chronic kidney disease) stage 3, GFR 30-59 ml/min (HCC)   . Coronary atherosclerosis of native coronary artery    BMS LAD 2006; residual 75% distal RCA; EF 50%  . Essential hypertension   . Glaucoma   . Low back pain   . MI (myocardial infarction) ([place])    Anterior 2006  . Mixed hyperlipidemia   . Renal insufficiency   . Type 2 diabetes mellitus ([place])     Past Surgical History:  Procedure Laterality Date  . CATARACT EXTRACTION, BILATERAL    . CORONARY ANGIOPLASTY WITH STENT PLACEMENT  2006    Current Outpatient Medications  Medication Sig Dispense Refill  . ACCU-CHEK AVIVA PLUS test strip USE TO CHECK BLOOD SUGAR ONCE DAILY 100 each 3  . aspirin 81 MG tablet Take 81 mg by mouth daily.    [name] atorvastatin (LIPITOR) 40 MG tablet Take 1 tablet (40 mg total) by mouth daily. 90 tablet 1  . brimonidine (ALPHAGAN) 0.2 % ophthalmic solution SMARTSIG:In Eye(s)    . Cholecalciferol (VITAMIN D3) 2000 UNITS TABS Take 1 tablet by mouth daily.    . Cyanocobalamin (B-12 PO) Take by mouth.    . Ferrous Sulfate (IRON PO) Take by mouth.     . fish oil-omega-3 fatty acids 1000 MG capsule Take 1 g by mouth 2 (two) times daily.    [name] gemfibrozil (LOPID) 600 MG tablet TAKE 1 TABLET BY MOUTH TWICE DAILY BEFORE MEAL(S) 180 tablet 1  . glipiZIDE (GLUCOTROL) 5 MG tablet Take 1 tablet (5 mg total) by mouth 2 (two) times daily before a meal. 180 tablet 1  . isosorbide mononitrate (IMDUR) 30 MG 24 hr tablet Take 1 tablet (30 mg total) by mouth daily. New 01/30/2018 90 tablet 1  . lisinopril (ZESTRIL) 2.5 MG tablet Take 1 tablet (2.5 mg total) by mouth daily. 90 tablet 1  . metFORMIN (GLUCOPHAGE) 1000 MG tablet Take 0.5 tablets (500 mg total) by mouth 2 (two) times daily with a meal. 180 tablet 1  . metoprolol tartrate (LOPRESSOR) 25 MG tablet Take 12.5 mg by mouth in the morning and at bedtime.    . pantoprazole (PROTONIX) 40 MG tablet Take 1 tablet by mouth once daily 90 tablet 1  . sodium bicarbonate 650 MG tablet Take by mouth.    . traZODone (DESYREL) 100 MG tablet Take 1 tablet (100 mg total) by mouth at bedtime. 90 tablet 1  . nitroGLYCERIN (NITROSTAT) 0.4 MG SL tablet Place 1 tablet (0.4 mg total) under the tongue every 5 (five) minutes x 3 doses as  needed for chest pain (if no relief after 2nd dose, proceed to the ED for an evaluation). 25 tablet 3   No current facility-administered medications for this visit.   Allergies:  Patient has no known allergies.   ROS: No palpitations or syncope.  Physical Exam: VS:  BP (!) 112/58   Pulse 62   Ht 5\' 10"  (1.778 m)   Wt 156 lb 6.4 oz (70.9 kg)   SpO2 97%   BMI 22.44 kg/m , BMI Body mass index is 22.44 kg/m.  Wt Readings from Last 3 Encounters:  10/06/20 156 lb 6.4 oz (70.9 kg)  08/16/20 158 lb (71.7 kg)  08/15/20 159 lb 13.3 oz (72.5 kg)    General: Patient appears comfortable at rest. HEENT: Conjunctiva and lids normal, wearing a mask. Neck: Supple, no elevated JVP or carotid bruits, no thyromegaly. Lungs: Clear to auscultation, nonlabored breathing at rest. Cardiac: Regular  rate and rhythm, no S3 or significant systolic murmur, no pericardial rub. Extremities: No pitting edema, distal pulses 2+.  ECG:  An ECG dated 12/31/2018 was personally reviewed today and demonstrated:  Sinus bradycardia with decreased R wave progression and low voltage.  Recent Labwork: 08/16/2020: ALT 17; AST 31; BUN 16; Creatinine, Ser 1.56; Hemoglobin 11.1; Platelets 72; Potassium 5.4; Sodium 142     Component Value Date/Time   CHOL 108 08/16/2020 1022   CHOL 177 09/25/2012 1015   TRIG 93 08/16/2020 1022   TRIG 206 (H) 10/07/2014 0927   TRIG 269 (H) 09/25/2012 1015   HDL 27 (L) 08/16/2020 1022   HDL 22 (L) 10/07/2014 0927   HDL 28 (L) 09/25/2012 1015   CHOLHDL 4.0 08/16/2020 1022   LDLCALC 63 08/16/2020 1022   LDLCALC 98 12/21/2013 0850   [place] 95 09/25/2012 1015    Other Studies Reviewed Today:  Lexiscan Myoview 02/04/2018:  There was no ST segment deviation noted during stress.  Defect 1: There is a medium defect of severe severity present in the mid anteroseptal, apical anterior, apical septal and apex location. This is consistent with myocardial scar.  Defect 2: There is a medium defect of moderate severity present in the mid inferolateral, apical inferior and apical lateral location. This is consistent with scar with a moderate degree of peri-infarct ischemia.  Findings consistent with prior myocardial infarction with moderate peri-infarct ischemia in the inferolateral wall.  This is an intermediate risk study.  Nuclear stress EF: 53%.  Assessment and Plan:  1.  Symptomatically stable CAD status post BMS to the LAD in 2006 with moderate residual RCA disease managed medically.  Follow-up Myoview in 2019 showed evidence of infarct scar with moderate peri-infarct ischemia involving the inferolateral wall.  He has preferred conservative management so far in the absence of progressive angina.  ECG reviewed and stable.  Continue aspirin, Lipitor, lisinopril, Imdur,  Lopressor, and as needed nitroglycerin which will be refilled.  2.  CKD stage IV, followed by Dr. Theador Hawthorne.  Creatinine has been 1.5-2.0 range.  3.  Mixed hyperlipidemia, continues on Lipitor and Lopid.  Recent LDL 63.  Medication Adjustments/Labs and Tests Ordered: Current medicines are reviewed at length with the patient today.  Concerns regarding medicines are outlined above.   Tests Ordered: Orders Placed This Encounter  Procedures  . EKG 12-Lead    Medication Changes: Meds ordered this encounter  Medications  . nitroGLYCERIN (NITROSTAT) 0.4 MG SL tablet    Sig: Place 1 tablet (0.4 mg total) under the tongue every 5 (five) minutes x 3 doses  as needed for chest pain (if no relief after 2nd dose, proceed to the ED for an evaluation).    Dispense:  25 tablet    Refill:  3    Disposition:  Follow up 6 months.  Signed, Satira Sark, MD, [facility] 10/06/2020 10:15 AM    [facility] [place], [place], [place] [address] Phone: [phone]; Fax: [phone]

## 2020-10-06 ENCOUNTER — Ambulatory Visit (INDEPENDENT_AMBULATORY_CARE_PROVIDER_SITE_OTHER): Payer: Medicare Other | Admitting: Cardiology

## 2020-10-06 ENCOUNTER — Encounter: Payer: Self-pay | Admitting: Cardiology

## 2020-10-06 VITALS — BP 112/58 | HR 62 | Ht 70.0 in | Wt 156.4 lb

## 2020-10-06 DIAGNOSIS — E782 Mixed hyperlipidemia: Secondary | ICD-10-CM

## 2020-10-06 DIAGNOSIS — I25119 Atherosclerotic heart disease of native coronary artery with unspecified angina pectoris: Secondary | ICD-10-CM | POA: Diagnosis not present

## 2020-10-06 MED ORDER — NITROGLYCERIN 0.4 MG SL SUBL
0.4000 mg | SUBLINGUAL_TABLET | SUBLINGUAL | 3 refills | Status: DC | PRN
Start: 2020-10-06 — End: 2021-12-03

## 2020-10-06 NOTE — Patient Instructions (Signed)
Your physician recommends that you schedule a follow-up appointment in: 6 MONTHS WITH DR MCDOWELL  Your physician recommends that you continue on your current medications as directed. Please refer to the Current Medication list given to you today.  Thank you for choosing [facility]!!    

## 2020-11-22 DIAGNOSIS — D696 Thrombocytopenia, unspecified: Secondary | ICD-10-CM | POA: Diagnosis not present

## 2020-11-22 DIAGNOSIS — E1122 Type 2 diabetes mellitus with diabetic chronic kidney disease: Secondary | ICD-10-CM | POA: Diagnosis not present

## 2020-11-22 DIAGNOSIS — N189 Chronic kidney disease, unspecified: Secondary | ICD-10-CM | POA: Diagnosis not present

## 2020-11-22 DIAGNOSIS — E872 Acidosis: Secondary | ICD-10-CM | POA: Diagnosis not present

## 2020-11-22 DIAGNOSIS — I952 Hypotension due to drugs: Secondary | ICD-10-CM | POA: Diagnosis not present

## 2020-11-22 DIAGNOSIS — D631 Anemia in chronic kidney disease: Secondary | ICD-10-CM | POA: Diagnosis not present

## 2020-11-30 DIAGNOSIS — D631 Anemia in chronic kidney disease: Secondary | ICD-10-CM | POA: Diagnosis not present

## 2020-11-30 DIAGNOSIS — E1122 Type 2 diabetes mellitus with diabetic chronic kidney disease: Secondary | ICD-10-CM | POA: Diagnosis not present

## 2020-11-30 DIAGNOSIS — E875 Hyperkalemia: Secondary | ICD-10-CM | POA: Diagnosis not present

## 2020-11-30 DIAGNOSIS — D696 Thrombocytopenia, unspecified: Secondary | ICD-10-CM | POA: Diagnosis not present

## 2020-11-30 DIAGNOSIS — N189 Chronic kidney disease, unspecified: Secondary | ICD-10-CM | POA: Diagnosis not present

## 2020-12-08 ENCOUNTER — Other Ambulatory Visit: Payer: Self-pay

## 2020-12-08 ENCOUNTER — Other Ambulatory Visit: Payer: Self-pay | Admitting: Nurse Practitioner

## 2020-12-08 ENCOUNTER — Inpatient Hospital Stay (HOSPITAL_COMMUNITY): Payer: Medicare Other | Attending: Hematology

## 2020-12-08 DIAGNOSIS — R002 Palpitations: Secondary | ICD-10-CM | POA: Diagnosis not present

## 2020-12-08 DIAGNOSIS — R200 Anesthesia of skin: Secondary | ICD-10-CM | POA: Insufficient documentation

## 2020-12-08 DIAGNOSIS — E782 Mixed hyperlipidemia: Secondary | ICD-10-CM | POA: Diagnosis not present

## 2020-12-08 DIAGNOSIS — E1122 Type 2 diabetes mellitus with diabetic chronic kidney disease: Secondary | ICD-10-CM | POA: Diagnosis not present

## 2020-12-08 DIAGNOSIS — Z833 Family history of diabetes mellitus: Secondary | ICD-10-CM | POA: Diagnosis not present

## 2020-12-08 DIAGNOSIS — Z801 Family history of malignant neoplasm of trachea, bronchus and lung: Secondary | ICD-10-CM | POA: Insufficient documentation

## 2020-12-08 DIAGNOSIS — G479 Sleep disorder, unspecified: Secondary | ICD-10-CM | POA: Insufficient documentation

## 2020-12-08 DIAGNOSIS — R42 Dizziness and giddiness: Secondary | ICD-10-CM | POA: Diagnosis not present

## 2020-12-08 DIAGNOSIS — Z87891 Personal history of nicotine dependence: Secondary | ICD-10-CM | POA: Diagnosis not present

## 2020-12-08 DIAGNOSIS — D509 Iron deficiency anemia, unspecified: Secondary | ICD-10-CM

## 2020-12-08 DIAGNOSIS — I129 Hypertensive chronic kidney disease with stage 1 through stage 4 chronic kidney disease, or unspecified chronic kidney disease: Secondary | ICD-10-CM | POA: Insufficient documentation

## 2020-12-08 DIAGNOSIS — D539 Nutritional anemia, unspecified: Secondary | ICD-10-CM | POA: Insufficient documentation

## 2020-12-08 DIAGNOSIS — R5383 Other fatigue: Secondary | ICD-10-CM | POA: Insufficient documentation

## 2020-12-08 DIAGNOSIS — D696 Thrombocytopenia, unspecified: Secondary | ICD-10-CM | POA: Diagnosis not present

## 2020-12-08 DIAGNOSIS — I252 Old myocardial infarction: Secondary | ICD-10-CM | POA: Insufficient documentation

## 2020-12-08 DIAGNOSIS — Z8249 Family history of ischemic heart disease and other diseases of the circulatory system: Secondary | ICD-10-CM | POA: Insufficient documentation

## 2020-12-08 DIAGNOSIS — Z79899 Other long term (current) drug therapy: Secondary | ICD-10-CM | POA: Diagnosis not present

## 2020-12-08 DIAGNOSIS — I2510 Atherosclerotic heart disease of native coronary artery without angina pectoris: Secondary | ICD-10-CM | POA: Insufficient documentation

## 2020-12-08 DIAGNOSIS — F5101 Primary insomnia: Secondary | ICD-10-CM

## 2020-12-08 DIAGNOSIS — N1832 Chronic kidney disease, stage 3b: Secondary | ICD-10-CM | POA: Diagnosis not present

## 2020-12-08 LAB — CBC WITH DIFFERENTIAL/PLATELET
Abs Immature Granulocytes: 0.01 10*3/uL (ref 0.00–0.07)
Basophils Absolute: 0 10*3/uL (ref 0.0–0.1)
Basophils Relative: 1 %
Eosinophils Absolute: 0.1 10*3/uL (ref 0.0–0.5)
Eosinophils Relative: 2 %
HCT: 34.1 % — ABNORMAL LOW (ref 39.0–52.0)
Hemoglobin: 11.2 g/dL — ABNORMAL LOW (ref 13.0–17.0)
Immature Granulocytes: 0 %
Lymphocytes Relative: 29 %
Lymphs Abs: 1.2 10*3/uL (ref 0.7–4.0)
MCH: 33.4 pg (ref 26.0–34.0)
MCHC: 32.8 g/dL (ref 30.0–36.0)
MCV: 101.8 fL — ABNORMAL HIGH (ref 80.0–100.0)
Monocytes Absolute: 0.4 10*3/uL (ref 0.1–1.0)
Monocytes Relative: 9 %
Neutro Abs: 2.6 10*3/uL (ref 1.7–7.7)
Neutrophils Relative %: 59 %
Platelets: 82 10*3/uL — ABNORMAL LOW (ref 150–400)
RBC: 3.35 MIL/uL — ABNORMAL LOW (ref 4.22–5.81)
RDW: 14.9 % (ref 11.5–15.5)
WBC: 4.2 10*3/uL (ref 4.0–10.5)
nRBC: 0 % (ref 0.0–0.2)

## 2020-12-08 LAB — COMPREHENSIVE METABOLIC PANEL
ALT: 22 U/L (ref 0–44)
AST: 42 U/L — ABNORMAL HIGH (ref 15–41)
Albumin: 3.8 g/dL (ref 3.5–5.0)
Alkaline Phosphatase: 42 U/L (ref 38–126)
Anion gap: 11 (ref 5–15)
BUN: 16 mg/dL (ref 8–23)
CO2: 22 mmol/L (ref 22–32)
Calcium: 9.6 mg/dL (ref 8.9–10.3)
Chloride: 107 mmol/L (ref 98–111)
Creatinine, Ser: 1.64 mg/dL — ABNORMAL HIGH (ref 0.61–1.24)
GFR, Estimated: 45 mL/min — ABNORMAL LOW (ref >60)
Glucose, Bld: 71 mg/dL (ref 70–99)
Potassium: 5.1 mmol/L (ref 3.5–5.1)
Sodium: 140 mmol/L (ref 135–145)
Total Bilirubin: 1.9 mg/dL — ABNORMAL HIGH (ref 0.3–1.2)
Total Protein: 7.1 g/dL (ref 6.5–8.1)

## 2020-12-08 LAB — VITAMIN B12: Vitamin B-12: 1051 pg/mL — ABNORMAL HIGH (ref 180–914)

## 2020-12-08 LAB — IRON AND TIBC
Iron: 93 ug/dL (ref 45–182)
Saturation Ratios: 26 % (ref 17.9–39.5)
TIBC: 362 ug/dL (ref 250–450)
UIBC: 269 ug/dL

## 2020-12-08 LAB — FERRITIN: Ferritin: 77 ng/mL (ref 24–336)

## 2020-12-08 LAB — RETICULOCYTES
Immature Retic Fract: 10.2 % (ref 2.3–15.9)
RBC.: 3.32 MIL/uL — ABNORMAL LOW (ref 4.22–5.81)
Retic Count, Absolute: 47.5 10*3/uL (ref 19.0–186.0)
Retic Ct Pct: 1.4 % (ref 0.4–3.1)

## 2020-12-08 LAB — FOLATE: Folate: 10.7 ng/mL (ref >5.9)

## 2020-12-08 LAB — LACTATE DEHYDROGENASE: LDH: 137 U/L (ref 98–192)

## 2020-12-09 LAB — ERYTHROPOIETIN: Erythropoietin: 11.9 m[IU]/mL (ref 2.6–18.5)

## 2020-12-10 LAB — COPPER, SERUM: Copper: 61 ug/dL — ABNORMAL LOW (ref 69–132)

## 2020-12-11 LAB — METHYLMALONIC ACID, SERUM: Methylmalonic Acid, Quantitative: 98 nmol/L (ref 0–378)

## 2020-12-14 NOTE — Progress Notes (Signed)
[address], [place] [address]   CLINIC:  Medical Oncology/Hematology  PCP:  [name], [address] [place] [address] [phone]   REASON FOR VISIT:  Follow-up for thrombocytopenia and macrocytic anemia  PRIOR THERAPY: None  CURRENT THERAPY: Surveillance  INTERVAL HISTORY:  Edward [name] 70 y.o. male returns for routine follow-up of thrombocytopenia and macrocytic anemia.  He was last seen by Tarri Abernethy PA-C on 08/15/2020.  At today's visit, he reports feeling well.  No recent hospitalizations, surgeries, or changes in baseline health status.  He denies any blood loss such as epistaxis, hematemesis, hematochezia, or melena.  He does admit to easy bruising. For the past month, he reports that he has been feeling more fatigued. He admits to occasional palpitations.  No chest pain, dyspnea, syncope.  No B symptoms such as fever, chills, night sweats, unintentional weight loss. He denies abdominal pain or early satiety, but does note that he has had decreased appetite.  He continues to take his ferrous sulfate twice daily.  He has 50% energy and 25% appetite. He endorses that he is maintaining a stable weight.    REVIEW OF SYSTEMS:  Review of Systems  Constitutional:  Positive for appetite change (25%) and fatigue (50%). Negative for chills, diaphoresis, fever and unexpected weight change.  HENT:   Negative for lump/mass and nosebleeds.   Eyes:  Negative for eye problems.  Respiratory:  Negative for cough, hemoptysis and shortness of breath.   Cardiovascular:  Negative for chest pain, leg swelling and palpitations.  Gastrointestinal:  Positive for diarrhea. Negative for abdominal pain, blood in stool, constipation, nausea and vomiting.  Genitourinary:  Negative for hematuria.   Skin: Negative.   Neurological:  Positive for dizziness and numbness (tingling in hands). Negative for headaches and  light-headedness.  Hematological:  Bruises/bleeds easily (bruising).  Psychiatric/Behavioral:  Positive for sleep disturbance.      PAST MEDICAL/SURGICAL HISTORY:  Past Medical History:  Diagnosis Date   Cataract    CKD (chronic kidney disease) stage 3, GFR 30-59 ml/min (HCC)    Coronary atherosclerosis of native coronary artery    BMS LAD 2006; residual 75% distal RCA; EF 50%   Essential hypertension    Glaucoma    Low back pain    MI (myocardial infarction) ([place])    Anterior 2006   Mixed hyperlipidemia    Renal insufficiency    Type 2 diabetes mellitus ([place])    Past Surgical History:  Procedure Laterality Date   CATARACT EXTRACTION, BILATERAL     CORONARY ANGIOPLASTY WITH STENT PLACEMENT  2006     SOCIAL HISTORY:  Social History   Socioeconomic History   Marital status: Legally Separated    Spouse name: [name]    Number of children: 2   Years of education: 7th   Highest education level: 7th grade  Occupational History   Occupation: Retired  Tobacco Use   Smoking status: Former    Packs/day: 2.00    Years: 20.00    Pack years: 40.00    Types: Cigarettes    Quit date: 05/08/1987    Years since quitting: 33.6   Smokeless tobacco: Never  Vaping Use   Vaping Use: Never used  Substance and Sexual Activity   Alcohol use: No    Alcohol/week: 0.0 standard drinks   Drug use: No   Sexual activity: Not Currently  Other Topics Concern   Not on file  Social History Narrative  Lives alone    Social Determinants of Health   Financial Resource Strain: Not on file  Food Insecurity: Not on file  Transportation Needs: Not on file  Physical Activity: Not on file  Stress: Not on file  Social Connections: Not on file  Intimate Partner Violence: Not on file    FAMILY HISTORY:  Family History  Problem Relation Age of Onset   Aneurysm Father        Brain aneurysm   Lung cancer Mother        Small cell carcinoma of lung,kidney and heart   Heart disease Mother     Cancer Brother        lungs   Diabetes Sister    Diabetes Sister    Diabetes Brother    Diabetes Brother     CURRENT MEDICATIONS:  Outpatient Encounter Medications as of 12/15/2020  Medication Sig   ACCU-CHEK AVIVA PLUS test strip USE TO CHECK BLOOD SUGAR ONCE DAILY   aspirin 81 MG tablet Take 81 mg by mouth daily.   atorvastatin (LIPITOR) 40 MG tablet Take 1 tablet (40 mg total) by mouth daily.   brimonidine (ALPHAGAN) 0.2 % ophthalmic solution SMARTSIG:In Eye(s)   Cholecalciferol (VITAMIN D3) 2000 UNITS TABS Take 1 tablet by mouth daily.   Cyanocobalamin (B-12 PO) Take by mouth.   Ferrous Sulfate (IRON PO) Take by mouth.   fish oil-omega-3 fatty acids 1000 MG capsule Take 1 g by mouth 2 (two) times daily.   gemfibrozil (LOPID) 600 MG tablet TAKE 1 TABLET BY MOUTH TWICE DAILY BEFORE MEAL(S)   glipiZIDE (GLUCOTROL) 5 MG tablet Take 1 tablet (5 mg total) by mouth 2 (two) times daily before a meal.   isosorbide mononitrate (IMDUR) 30 MG 24 hr tablet Take 1 tablet (30 mg total) by mouth daily. New 01/30/2018   lisinopril (ZESTRIL) 2.5 MG tablet Take 1 tablet (2.5 mg total) by mouth daily.   metFORMIN (GLUCOPHAGE) 1000 MG tablet Take 0.5 tablets (500 mg total) by mouth 2 (two) times daily with a meal.   metoprolol tartrate (LOPRESSOR) 25 MG tablet Take 12.5 mg by mouth in the morning and at bedtime.   nitroGLYCERIN (NITROSTAT) 0.4 MG SL tablet Place 1 tablet (0.4 mg total) under the tongue every 5 (five) minutes x 3 doses as needed for chest pain (if no relief after 2nd dose, proceed to the ED for an evaluation).   pantoprazole (PROTONIX) 40 MG tablet Take 1 tablet by mouth once daily   sodium bicarbonate 650 MG tablet Take by mouth.   traZODone (DESYREL) 100 MG tablet TAKE 1 TABLET BY MOUTH AT BEDTIME   No facility-administered encounter medications on file as of 12/15/2020.    ALLERGIES:  No Known Allergies   PHYSICAL EXAM:  ECOG PERFORMANCE STATUS: 1 - Symptomatic but completely  ambulatory  There were no vitals filed for this visit. There were no vitals filed for this visit. Physical Exam Constitutional:      Appearance: Normal appearance.  HENT:     Head: Normocephalic and atraumatic.     Mouth/Throat:     Mouth: Mucous membranes are moist.  Eyes:     Extraocular Movements: Extraocular movements intact.     Pupils: Pupils are equal, round, and reactive to light.  Cardiovascular:     Rate and Rhythm: Normal rate and regular rhythm.     Pulses: Normal pulses.     Heart sounds: Normal heart sounds.  Pulmonary:     Effort: Pulmonary effort is  normal.     Breath sounds: Normal breath sounds.  Abdominal:     General: Bowel sounds are normal.     Palpations: Abdomen is soft.     Tenderness: There is no abdominal tenderness.     Comments: Edge of liver palpated below rib margins.  No splenomegaly appreciated on exam.  Musculoskeletal:        General: No swelling.     Right lower leg: No edema.     Left lower leg: No edema.  Lymphadenopathy:     Cervical: No cervical adenopathy.  Skin:    General: Skin is warm and dry.  Neurological:     General: No focal deficit present.     Mental Status: He is alert and oriented to person, place, and time.  Psychiatric:        Mood and Affect: Mood normal.        Behavior: Behavior normal.     LABORATORY DATA:  I have reviewed the labs as listed.  CBC    Component Value Date/Time   WBC 4.2 12/08/2020 0842   RBC 3.35 (L) 12/08/2020 0842   RBC 3.32 (L) 12/08/2020 0842   HGB 11.2 (L) 12/08/2020 0842   HGB 11.1 (L) 08/16/2020 1022   HCT 34.1 (L) 12/08/2020 0842   HCT 33.8 (L) 08/16/2020 1022   PLT 82 (L) 12/08/2020 0842   PLT 72 (LL) 08/16/2020 1022   MCV 101.8 (H) 12/08/2020 0842   MCV 103 (H) 08/16/2020 1022   MCH 33.4 12/08/2020 0842   MCHC 32.8 12/08/2020 0842   RDW 14.9 12/08/2020 0842   RDW 13.7 08/16/2020 1022   LYMPHSABS 1.2 12/08/2020 0842   LYMPHSABS 1.4 08/16/2020 1022   MONOABS 0.4  12/08/2020 0842   EOSABS 0.1 12/08/2020 0842   EOSABS 0.1 08/16/2020 1022   BASOSABS 0.0 12/08/2020 0842   BASOSABS 0.0 08/16/2020 1022   CMP Latest Ref Rng & Units 12/08/2020 08/16/2020 05/02/2020  Glucose 70 - 99 mg/dL 71 86 127(H)  BUN 8 - 23 mg/dL 16 16 36(H)  Creatinine 0.61 - 1.24 mg/dL 1.64(H) 1.56(H) 2.48(H)  Sodium 135 - 145 mmol/L 140 142 140  Potassium 3.5 - 5.1 mmol/L 5.1 5.4(H) 5.1  Chloride 98 - 111 mmol/L 107 105 108(H)  CO2 22 - 32 mmol/L 22 21 17(L)  Calcium 8.9 - 10.3 mg/dL 9.6 9.5 9.2  Total Protein 6.5 - 8.1 g/dL 7.1 6.6 -  Total Bilirubin 0.3 - 1.2 mg/dL 1.9(H) 0.7 -  Alkaline Phos 38 - 126 U/L 42 53 -  AST 15 - 41 U/L 42(H) 31 -  ALT 0 - 44 U/L 22 17 -    DIAGNOSTIC IMAGING:  I have independently reviewed the relevant imaging and discussed with the patient.  ASSESSMENT & PLAN: 1.   Moderate thrombocytopenia: - Patient has had thrombocytopenia noted since May 2021 - Has easy bruising on the upper extremities but no active bleeding.   - He does not have any B symptoms.  Denies any history of hepatitis or history of blood transfusion. - Physical exam reveals palpable liver edge.  No palpable splenomegaly.  No abdominal imaging on file.  LFTs within normal limits. - Hepatitis panel checked in 2021 was negative; patient was POSITIVE for H. Pylori IgG antibodies - Most recent labs (12/08/2020): Platelets 82 and stable at baseline; no B12 or folate deficiencies; copper mildly low at 61 - Differential diagnosis includes immune mediated thrombocytopenia versus MDS; may also have some undiagnosed liver disease or   splenomegaly -Discussed possible early MDS with patient, but he would like to wait on bone marrow biopsy for now - we will defer bone marrow biopsy and continue to watch blood counts closely - PLAN: Check abdominal ultrasound due to concern for occult liver disease or splenomegaly.  Start taking OTC copper supplement daily.  RTC in 4 months for repeat labs  (including copper).  If abdominal ultrasound negative, would once again have patient consider bone marrow biopsy to rule out MDS.  2.  Macrocytic anemia with iron deficiency: - Hemoglobin over the past 12 months ranges from 10.2 - 12.2, with macrocytosis - No EGD or colonoscopy on file - SPEP in June 2021 was normal.  Erythropoietin normal at 11.9. - No epistaxis, hematochezia, or melena - Currently taking ferrous sulfate 325 mg twice daily - Differential diagnosis favors combination anemia from CKD IIIb and relative iron deficiency.  May be an element of possible early MDS  -Most recent labs (12/08/2020): Hgb 11.2, MCV 101.8, ferritin 77, iron saturation 26%, creatinine 1.64 with GFR 45 - PLAN: Continue ferrous sulfate twice daily.  RTC in 4 months for repeat labs (CBC, CMP, iron panel) and office visit.  If hemoglobin drops to less than 10.0, we will consider using ESA.  3.  CKD stage IIIb: -He has CKD with creatinine ranging from 1.4-1.8. -Renal ultrasound on 02/07/2016 shows mild bilateral lobular renal contour.  Scarring cannot be excluded.  No mass or hydronephrosis. - PLAN: Continue follow-up with Dr. Bhutani   PLAN SUMMARY & DISPOSITION: -Start taking OTC copper supplement - Abdominal ultrasound - Phone visit after ultrasound to discuss results - Labs in 4 months - Office visit after labs  All questions were answered. The patient knows to call the clinic with any problems, questions or concerns.  Medical decision making: Moderate  Time spent on visit: I spent 15 minutes counseling the patient face to face. The total time spent in the appointment was 20 minutes and more than 50% was on counseling.    M , PA-C  12/15/2020 8:56 AM    

## 2020-12-15 ENCOUNTER — Inpatient Hospital Stay (HOSPITAL_BASED_OUTPATIENT_CLINIC_OR_DEPARTMENT_OTHER): Payer: Medicare Other | Admitting: Physician Assistant

## 2020-12-15 ENCOUNTER — Other Ambulatory Visit: Payer: Self-pay

## 2020-12-15 VITALS — BP 107/52 | HR 63 | Temp 96.8°F | Resp 18 | Wt 156.7 lb

## 2020-12-15 DIAGNOSIS — R5383 Other fatigue: Secondary | ICD-10-CM | POA: Diagnosis not present

## 2020-12-15 DIAGNOSIS — D696 Thrombocytopenia, unspecified: Secondary | ICD-10-CM | POA: Diagnosis not present

## 2020-12-15 DIAGNOSIS — R002 Palpitations: Secondary | ICD-10-CM | POA: Diagnosis not present

## 2020-12-15 DIAGNOSIS — D539 Nutritional anemia, unspecified: Secondary | ICD-10-CM

## 2020-12-15 DIAGNOSIS — D509 Iron deficiency anemia, unspecified: Secondary | ICD-10-CM

## 2020-12-15 DIAGNOSIS — R42 Dizziness and giddiness: Secondary | ICD-10-CM | POA: Diagnosis not present

## 2020-12-15 DIAGNOSIS — I2510 Atherosclerotic heart disease of native coronary artery without angina pectoris: Secondary | ICD-10-CM | POA: Diagnosis not present

## 2020-12-15 NOTE — Patient Instructions (Signed)
[place] at [facility] Discharge Instructions  You were seen today by Tarri Abernethy PA-C for your low platelets and low blood count.  ANEMIA (low blood count): Your hemoglobin/red blood cells remain mildly low.  This is likely due to your underlying kidney disease.  It is not at a level that we need to be concerned about it right now, but we will continue to watch it closely.  Your iron levels have improved.  Continue taking your iron supplement twice daily.  THROMBOCYTOPENIA (low platelet count): We do not know for sure why your platelets are low.  It is possible that your immune system may be attacking your platelets, but there are other conditions that can cause low platelets as well.  We will check an abdominal ultrasound to see if you have any underlying liver or spleen abnormalities that could be causing your low platelets.  We would also like you to start taking copper (over-the-counter supplement) once daily.  You are slightly low on copper, which could be contributing to your low platelets and your low blood count.  LABS: Return in 4 months for follow-up visit  OTHER TESTS: Abdominal ultrasound  MEDICATIONS: Continue iron supplement twice daily.  START taking over-the-counter copper supplement once daily.  FOLLOW-UP APPOINTMENT: Phone visit after ultrasound to discuss results.  Office visit in 4 months.   Thank you for choosing [place] at [facility] to provide your oncology and hematology care.  To afford each patient quality time with our provider, please arrive at least 15 minutes before your scheduled appointment time.   If you have a lab appointment with the [facility] please come in thru the Main Entrance and check in at the main information desk.  You need to re-schedule your appointment should you arrive 10 or more minutes late.  We strive to give you quality time with our providers, and arriving late affects you and  other patients whose appointments are after yours.  Also, if you no show three or more times for appointments you may be dismissed from the clinic at the providers discretion.     Again, thank you for choosing [facility].  Our hope is that these requests will decrease the amount of time that you wait before being seen by our physicians.       _____________________________________________________________  Should you have questions after your visit to [facility], please contact our office at [phone] and follow the prompts.  Our office hours are 8:00 a.m. and 4:30 p.m. Monday - Friday.  Please note that voicemails left after 4:00 p.m. may not be returned until the following business day.  We are closed weekends and major holidays.  You do have access to a nurse 24-7, just call the main number to the clinic [phone] and do not press any options, hold on the line and a nurse will answer the phone.    For prescription refill requests, have your pharmacy contact our office and allow 72 hours.    Due to Covid, you will need to wear a mask upon entering the hospital. If you do not have a mask, a mask will be given to you at the Main Entrance upon arrival. For doctor visits, patients may have 1 support person age 30 or older with them. For treatment visits, patients can not have anyone with them due to social distancing guidelines and our immunocompromised population.

## 2020-12-21 ENCOUNTER — Other Ambulatory Visit: Payer: Self-pay

## 2020-12-21 ENCOUNTER — Ambulatory Visit (HOSPITAL_COMMUNITY)
Admission: RE | Admit: 2020-12-21 | Discharge: 2020-12-21 | Disposition: A | Discharge status: Home / Self Care | Payer: Medicare Other | Source: Ambulatory Visit | Attending: Physician Assistant | Admitting: Physician Assistant

## 2020-12-21 DIAGNOSIS — D696 Thrombocytopenia, unspecified: Secondary | ICD-10-CM | POA: Diagnosis not present

## 2020-12-21 DIAGNOSIS — R161 Splenomegaly, not elsewhere classified: Secondary | ICD-10-CM | POA: Diagnosis not present

## 2020-12-21 DIAGNOSIS — K8020 Calculus of gallbladder without cholecystitis without obstruction: Secondary | ICD-10-CM | POA: Diagnosis not present

## 2020-12-28 ENCOUNTER — Telehealth: Payer: Self-pay | Admitting: Nurse Practitioner

## 2020-12-28 NOTE — Telephone Encounter (Signed)
Will need to make an appointment to discuss diabetes med changes

## 2020-12-28 NOTE — Telephone Encounter (Signed)
Will send PCP and covering provider to see if it can wait for PCP to return

## 2020-12-28 NOTE — Telephone Encounter (Signed)
Appt made

## 2020-12-30 ENCOUNTER — Other Ambulatory Visit: Payer: Self-pay

## 2020-12-30 ENCOUNTER — Ambulatory Visit (INDEPENDENT_AMBULATORY_CARE_PROVIDER_SITE_OTHER): Payer: Medicare Other | Admitting: Nurse Practitioner

## 2020-12-30 ENCOUNTER — Encounter: Payer: Self-pay | Admitting: Nurse Practitioner

## 2020-12-30 DIAGNOSIS — E1122 Type 2 diabetes mellitus with diabetic chronic kidney disease: Secondary | ICD-10-CM | POA: Diagnosis not present

## 2020-12-30 DIAGNOSIS — I25119 Atherosclerotic heart disease of native coronary artery with unspecified angina pectoris: Secondary | ICD-10-CM | POA: Diagnosis not present

## 2020-12-30 DIAGNOSIS — N1832 Chronic kidney disease, stage 3b: Secondary | ICD-10-CM | POA: Diagnosis not present

## 2020-12-30 MED ORDER — GLIPIZIDE 5 MG PO TABS: ORAL_TABLET | ORAL | 1 refills | Status: DC

## 2020-12-30 NOTE — Progress Notes (Signed)
   Subjective:    Patient ID: Edward [name], male    DOB: [birth year], 70 y.o.   MRN: [id number]   Chief Complaint: Discuss changing metformin   HPI Patient come sin to discuss his medication. He is currently on metformin '1000mg'$  that he takes 2x a day. He has developed diarrhea since increasing dose. He does not watch his diet and his last hgba1c was 5.8. he has been taking metformin '1000mg'$  1/2 tablet BID and is still having diarrhea.    Review of Systems  Constitutional:  Negative for diaphoresis.  Eyes:  Negative for pain.  Respiratory:  Negative for shortness of breath.   Cardiovascular:  Negative for chest pain, palpitations and leg swelling.  Gastrointestinal:  Positive for diarrhea. Negative for abdominal pain.  Endocrine: Negative for polydipsia.  Skin:  Negative for rash.  Neurological:  Negative for dizziness, weakness and headaches.  Hematological:  Does not bruise/bleed easily.  All other systems reviewed and are negative.     Objective:   Physical Exam Constitutional:      Appearance: Normal appearance.  Cardiovascular:     Rate and Rhythm: Normal rate and regular rhythm.     Heart sounds: Normal heart sounds.  Pulmonary:     Effort: Pulmonary effort is normal.     Breath sounds: Normal breath sounds.  Skin:    General: Skin is warm.  Neurological:     General: No focal deficit present.     Mental Status: He is alert and oriented to person, place, and time.  Psychiatric:        Mood and Affect: Mood normal.        Behavior: Behavior normal.   BP 122/69   Pulse 63   Temp (!) 97.5 F (36.4 C) (Temporal)   Resp 20   Ht '5\' 10"'$  (1.778 m)   Wt 154 lb (69.9 kg)   SpO2 100%   BMI 22.10 kg/m          Assessment & Plan:  Edward [name] [facility] in today with chief complaint of Discuss changing metformin   1. Type 2 diabetes mellitus with stage 3b chronic kidney disease, without long-term current use of insulin (HCC) Stop metformin Increase glipizide to 2  tablets in mornig and 1 tablet at night. Keep diary of blood sugars Watch for low blood sugars. - glipiZIDE (GLUCOTROL) 5 MG tablet; 2 tablets in morning and 1 tablet at night  Dispense: 270 tablet; Refill: 1    The above assessment and management plan was discussed with the patient. The patient verbalized understanding of and has agreed to the management plan. Patient is aware to call the clinic if symptoms persist or worsen. Patient is aware when to return to the clinic for a follow-up visit. Patient educated on when it is appropriate to go to the emergency department.   Mary-Margaret Hassell Done, FNP

## 2020-12-30 NOTE — Patient Instructions (Signed)
Hypoglycemia Hypoglycemia is when the sugar (glucose) level in your blood is too low. Low blood sugar can happen to people who have diabetes and people who do not have diabetes. Low blood sugar can happenquickly, and it can be an emergency. What are the causes? This condition happens most often in people who have diabetes. It may be caused by: Diabetes medicine. Not eating enough, or not eating often enough. Doing more physical activity. Drinking alcohol on an empty stomach. If you do not have diabetes, this condition may be caused by: A tumor in the pancreas. Not eating enough, or not eating for long periods at a time (fasting). A very bad infection or illness. Problems after having weight loss (bariatric) surgery. Kidney failure or liver failure. Certain medicines. What increases the risk? This condition is more likely to develop in people who: Have diabetes and take medicines to lower their blood sugar. Abuse alcohol. Have a very bad illness. What are the signs or symptoms? Mild Hunger. Sweating and feeling clammy. Feeling dizzy or light-headed. Being sleepy or having trouble sleeping. Feeling like you may vomit (nauseous). A fast heartbeat. A headache. Blurry vision. Mood changes, such as: Being grouchy. Feeling worried or nervous (anxious). Tingling or loss of feeling (numbness) around your mouth, lips, or tongue. Moderate Confusion and poor judgment. Behavior changes. Weakness. Uneven heartbeat. Trouble with moving (coordination). Very low Very low blood sugar (severe hypoglycemia) is a medical emergency. It can cause: Fainting. Seizures. Loss of consciousness (coma). Death. How is this treated? Treating low blood sugar Low blood sugar is often treated by eating or drinking something that has sugar in it right away. The food or drink should contain 15 grams of a fast-acting carb (carbohydrate). Options include: 4 oz (120 mL) of fruit juice. 4 oz (120 mL) of  regular soda (not diet soda). A few pieces of hard candy. Check food labels to see how many pieces to eat for 15 grams. 1 Tbsp (15 mL) of sugar or honey. 4 glucose tablets. 1 tube of glucose gel. Treating low blood sugar if you have diabetes If you can think clearly and swallow safely, follow the 15:15 rule: Take 15 grams of a fast-acting carb. Talk with your doctor about how much you should take. Always keep a source of fast-acting carb with you, such as: Glucose tablets (take 4 tablets). A few pieces of hard candy. Check food labels to see how many pieces to eat for 15 grams. 4 oz (120 mL) of fruit juice. 4 oz (120 mL) of regular soda (not diet soda). 1 Tbsp (15 mL) of honey or sugar. 1 tube of glucose gel. Check your blood sugar 15 minutes after you take the carb. If your blood sugar is still at or below 70 mg/dL (3.9 mmol/L), take 15 grams of a carb again. If your blood sugar does not go above 70 mg/dL (3.9 mmol/L) after 3 tries, get help right away. After your blood sugar goes back to normal, eat a meal or a snack within 1 hour.  Treating very low blood sugar If your blood sugar is below 54 mg/dL (3 mmol/L), you have very low blood sugar, or severe hypoglycemia. This is an emergency. Get medical help right away. If you have very low blood sugar and you cannot eat or drink, you will need to be given a hormone called glucagon. A family member or friend should learn how to check your blood sugar and how to give you glucagon. Ask your doctor if youneed   to have an emergency glucagon kit at home. Very low blood sugar may also need to be treated in a hospital. Follow these instructions at home: General instructions Take over-the-counter and prescription medicines only as told by your doctor. Stay aware of your blood sugar as told by your doctor. If you drink alcohol: Limit how much you have to: 0-1 drink a day for women who are not pregnant. 0-2 drinks a day for men. Know how much  alcohol is in your drink. In the [place] one drink equals one 12 oz bottle of beer (355 mL), one 5 oz glass of wine (148 mL), or one 1 oz glass of hard liquor (44 mL). Be sure to eat food when you drink alcohol. Know that your body absorbs alcohol quickly. This may lead to low blood sugar later. Be sure to keep checking your blood sugar. Keep all follow-up visits. If you have diabetes:  Always have a fast-acting carb (15 grams) with you to treat low blood sugar. Follow your diabetes care plan as told by your doctor. Make sure you: Know the symptoms of low blood sugar. Check your blood sugar as often as told. Always check it before and after exercise. Always check your blood sugar before you drive. Take your medicines as told. Follow your meal plan. Eat on time. Do not skip meals. Share your diabetes care plan with: Your work or school. People you live with. Carry a card or wear jewelry that says you have diabetes.  Where to find more information American Diabetes Association: www.diabetes.org Contact a doctor if: You have trouble keeping your blood sugar in your target range. You have low blood sugar often. Get help right away if: You still have symptoms after you eat or drink something that contains 15 grams of fast-acting carb, and you cannot get your blood sugar above 70 mg/dL by following the 15:15 rule. Your blood sugar is below 54 mg/dL (3 mmol/L). You have a seizure. You faint. These symptoms may be an emergency. Get help right away. Call your local emergency services (911 in the [place] Do not wait to see if the symptoms will go away. Do not drive yourself to the hospital. Summary Hypoglycemia happens when the level of sugar (glucose) in your blood is too low. Low blood sugar can happen to people who have diabetes and people who do not have diabetes. Low blood sugar can happen quickly, and it can be an emergency. Make sure you know the symptoms of low blood sugar and know how  to treat it. Always keep a source of sugar (fast-acting carb) with you to treat low blood sugar. This information is not intended to replace advice given to you by your health care provider. Make sure you discuss any questions you have with your healthcare provider. Document Revised: 03/24/2020 Document Reviewed: 03/24/2020 Elsevier Patient Education  2022 [employer].

## 2021-01-04 ENCOUNTER — Encounter (HOSPITAL_COMMUNITY): Payer: Self-pay | Admitting: *Deleted

## 2021-01-04 NOTE — Progress Notes (Signed)
I have reviewed results of abdominal ultrasound.  Mild splenomegaly is noted, which may be a contributing factor in the patient's thrombocytopenia.  No liver disease or other major abnormalities noted.  RN POOL: Please call the patient to relay the above information to him.  Since there were no major abnormalities, we can cancel his virtual visit with me that was scheduled for tomorrow (01/05/2021).  He will have repeat labs and office visit as previously scheduled for December 2022.

## 2021-01-05 ENCOUNTER — Telehealth (HOSPITAL_COMMUNITY): Payer: Medicare Other | Admitting: Physician Assistant

## 2021-02-20 ENCOUNTER — Other Ambulatory Visit: Payer: Self-pay

## 2021-02-20 ENCOUNTER — Ambulatory Visit (INDEPENDENT_AMBULATORY_CARE_PROVIDER_SITE_OTHER): Payer: Medicare Other | Admitting: Nurse Practitioner

## 2021-02-20 ENCOUNTER — Encounter: Payer: Self-pay | Admitting: Nurse Practitioner

## 2021-02-20 ENCOUNTER — Ambulatory Visit: Payer: Self-pay | Admitting: Nurse Practitioner

## 2021-02-20 VITALS — BP 122/65 | HR 65 | Temp 97.9°F | Resp 20 | Ht 70.0 in | Wt 154.0 lb

## 2021-02-20 DIAGNOSIS — F5101 Primary insomnia: Secondary | ICD-10-CM | POA: Diagnosis not present

## 2021-02-20 DIAGNOSIS — N1832 Chronic kidney disease, stage 3b: Secondary | ICD-10-CM | POA: Diagnosis not present

## 2021-02-20 DIAGNOSIS — E782 Mixed hyperlipidemia: Secondary | ICD-10-CM | POA: Diagnosis not present

## 2021-02-20 DIAGNOSIS — I10 Essential (primary) hypertension: Secondary | ICD-10-CM | POA: Diagnosis not present

## 2021-02-20 DIAGNOSIS — Z23 Encounter for immunization: Secondary | ICD-10-CM

## 2021-02-20 DIAGNOSIS — I25119 Atherosclerotic heart disease of native coronary artery with unspecified angina pectoris: Secondary | ICD-10-CM

## 2021-02-20 DIAGNOSIS — E1121 Type 2 diabetes mellitus with diabetic nephropathy: Secondary | ICD-10-CM | POA: Insufficient documentation

## 2021-02-20 DIAGNOSIS — E1122 Type 2 diabetes mellitus with diabetic chronic kidney disease: Secondary | ICD-10-CM | POA: Diagnosis not present

## 2021-02-20 DIAGNOSIS — E1022 Type 1 diabetes mellitus with diabetic chronic kidney disease: Secondary | ICD-10-CM | POA: Diagnosis not present

## 2021-02-20 DIAGNOSIS — K219 Gastro-esophageal reflux disease without esophagitis: Secondary | ICD-10-CM | POA: Diagnosis not present

## 2021-02-20 LAB — BAYER DCA HB A1C WAIVED: HB A1C (BAYER DCA - WAIVED): 6.8 % — ABNORMAL HIGH (ref 4.8–5.6)

## 2021-02-20 MED ORDER — ISOSORBIDE MONONITRATE ER 30 MG PO TB24: 30.0000 mg | ORAL_TABLET | Freq: Every day | ORAL | 1 refills | Status: DC

## 2021-02-20 MED ORDER — PANTOPRAZOLE SODIUM 40 MG PO TBEC: 40.0000 mg | DELAYED_RELEASE_TABLET | Freq: Every day | ORAL | 1 refills | Status: DC

## 2021-02-20 MED ORDER — TRAZODONE HCL 100 MG PO TABS: 100.0000 mg | ORAL_TABLET | Freq: Every day | ORAL | 0 refills | Status: DC

## 2021-02-20 MED ORDER — GEMFIBROZIL 600 MG PO TABS: ORAL_TABLET | ORAL | 1 refills | Status: DC

## 2021-02-20 MED ORDER — GLIPIZIDE 5 MG PO TABS: ORAL_TABLET | ORAL | 1 refills | Status: DC

## 2021-02-20 MED ORDER — LISINOPRIL 2.5 MG PO TABS: 2.5000 mg | ORAL_TABLET | Freq: Every day | ORAL | 1 refills | Status: DC

## 2021-02-20 MED ORDER — METOPROLOL TARTRATE 25 MG PO TABS: 12.5000 mg | ORAL_TABLET | Freq: Two times a day (BID) | ORAL | 11 refills | Status: DC

## 2021-02-20 MED ORDER — ATORVASTATIN CALCIUM 40 MG PO TABS: 40.0000 mg | ORAL_TABLET | Freq: Every day | ORAL | 1 refills | Status: DC

## 2021-02-20 NOTE — Patient Instructions (Signed)
Carbohydrate Counting for Diabetes Mellitus, Adult Carbohydrate counting is a method of keeping track of how many carbohydrates you eat. Eating carbohydrates naturally increases the amount of sugar (glucose) in the blood. Counting how many carbohydrates you eat improves your blood glucose control, which helps you manage your diabetes. It is important to know how many carbohydrates you can safely have in each meal. This is different for every person. A dietitian can help you make a meal plan and calculate how many carbohydrates you should have at each meal and snack. What foods contain carbohydrates? Carbohydrates are found in the following foods: Grains, such as breads and cereals. Dried beans and soy products. Starchy vegetables, such as potatoes, peas, and corn. Fruit and fruit juices. Milk and yogurt. Sweets and snack foods, such as cake, cookies, candy, chips, and soft drinks. How do I count carbohydrates in foods? There are two ways to count carbohydrates in food. You can read food labels or learn standard serving sizes of foods. You can use either of the methods or a combination of both. Using the Nutrition Facts label The Nutrition Facts list is included on the labels of almost all packaged foods and beverages in the [place] It includes: The serving size. Information about nutrients in each serving, including the grams (g) of carbohydrate per serving. To use the Nutrition Facts: Decide how many servings you will have. Multiply the number of servings by the number of carbohydrates per serving. The resulting number is the total amount of carbohydrates that you will be having. Learning the standard serving sizes of foods When you eat carbohydrate foods that are not packaged or do not include Nutrition Facts on the label, you need to measure the servings in order to count the amount of carbohydrates. Measure the foods that you will eat with a food scale or measuring cup, if needed. Decide how  many standard-size servings you will eat. Multiply the number of servings by 15. For foods that contain carbohydrates, one serving equals 15 g of carbohydrates. For example, if you eat 2 cups or 10 oz (300 g) of strawberries, you will have eaten 2 servings and 30 g of carbohydrates (2 servings x 15 g = 30 g). For foods that have more than one food mixed, such as soups and casseroles, you must count the carbohydrates in each food that is included. The following list contains standard serving sizes of common carbohydrate-rich foods. Each of these servings has about 15 g of carbohydrates: 1 slice of bread. 1 six-inch (15 cm) tortilla. ? cup or 2 oz (53 g) cooked rice or pasta.  cup or 3 oz (85 g) cooked or canned, drained and rinsed beans or lentils.  cup or 3 oz (85 g) starchy vegetable, such as peas, corn, or squash.  cup or 4 oz (120 g) hot cereal.  cup or 3 oz (85 g) boiled or mashed potatoes, or  or 3 oz (85 g) of a large baked potato.  cup or 4 fl oz (118 mL) fruit juice. 1 cup or 8 fl oz (237 mL) milk. 1 small or 4 oz (106 g) apple.  or 2 oz (63 g) of a medium banana. 1 cup or 5 oz (150 g) strawberries. 3 cups or 1 oz (24 g) popped popcorn. What is an example of carbohydrate counting? To calculate the number of carbohydrates in this sample meal, follow the steps shown below. Sample meal 3 oz (85 g) chicken breast. ? cup or 4 oz (106 g) brown   rice.  cup or 3 oz (85 g) corn. 1 cup or 8 fl oz (237 mL) milk. 1 cup or 5 oz (150 g) strawberries with sugar-free whipped topping. Carbohydrate calculation Identify the foods that contain carbohydrates: Rice. Corn. Milk. Strawberries. Calculate how many servings you have of each food: 2 servings rice. 1 serving corn. 1 serving milk. 1 serving strawberries. Multiply each number of servings by 15 g: 2 servings rice x 15 g = 30 g. 1 serving corn x 15 g = 15 g. 1 serving milk x 15 g = 15 g. 1 serving strawberries x 15 g = 15  g. Add together all of the amounts to find the total grams of carbohydrates eaten: 30 g + 15 g + 15 g + 15 g = 75 g of carbohydrates total. What are tips for following this plan? Shopping Develop a meal plan and then make a shopping list. Buy fresh and frozen vegetables, fresh and frozen fruit, dairy, eggs, beans, lentils, and whole grains. Look at food labels. Choose foods that have more fiber and less sugar. Avoid processed foods and foods with added sugars. Meal planning Aim to have the same amount of carbohydrates at each meal and for each snack time. Plan to have regular, balanced meals and snacks. Where to find more information American Diabetes Association: www.diabetes.org Centers for Disease Control and Prevention: www.cdc.gov Summary Carbohydrate counting is a method of keeping track of how many carbohydrates you eat. Eating carbohydrates naturally increases the amount of sugar (glucose) in the blood. Counting how many carbohydrates you eat improves your blood glucose control, which helps you manage your diabetes. A dietitian can help you make a meal plan and calculate how many carbohydrates you should have at each meal and snack. This information is not intended to replace advice given to you by your health care provider. Make sure you discuss any questions you have with your health care provider. Document Revised: 04/23/2019 Document Reviewed: 04/24/2019 Elsevier Patient Education  2021 Elsevier Inc.  

## 2021-02-20 NOTE — Progress Notes (Signed)
Subjective:    Patient ID: Edward [name], male    DOB: Jul 08, 1950, 70 y.o.   MRN: [id number]   Chief Complaint: medical management of chronic issues     HPI:  1. Essential hypertension No c/o chest pain, sob or headache. Does not check blood pressure at home. BP Readings from Last 3 Encounters:  12/30/20 122/69  12/15/20 (!) 107/52  10/06/20 (!) 112/58     2. Atherosclerosis of native coronary artery of native heart with angina pectoris [facility]) Last saw cardiology on 10/06/20. Reviewing office note no change was made to plan of care.  3. Type 2 diabetes mellitus with stage 3b chronic kidney disease ([place]) He checks blood sugars occasionally. Last check fasting was 100. He is not strict on his diet.' Lab Results  Component Value Date   HGBA1C 5.8 08/16/2020     4. Mixed hyperlipidemia Does not watch diet and does no dedicated exercise. Lab Results  Component Value Date   CHOL 108 08/16/2020   HDL 27 (L) 08/16/2020   LDLCALC 63 08/16/2020   TRIG 93 08/16/2020   CHOLHDL 4.0 08/16/2020     5. Stage 3b chronic kidney disease ([place]) Lab Results  Component Value Date   CREATININE 1.64 (H) 12/08/2020     6. Gastroesophageal reflux disease without esophagitis Is currently on protonix and is doing well.  7. Primary insomnia On trazadone which works well for sleep. Sleeps about 4-5 hours a night which is good for him. He doe stake naps during the day.    Outpatient Encounter Medications as of 02/20/2021  Medication Sig   ACCU-CHEK AVIVA PLUS test strip USE TO CHECK BLOOD SUGAR ONCE DAILY   aspirin 81 MG tablet Take 81 mg by mouth daily.   atorvastatin (LIPITOR) 40 MG tablet Take 1 tablet (40 mg total) by mouth daily.   brimonidine (ALPHAGAN) 0.2 % ophthalmic solution SMARTSIG:In Eye(s)   Cholecalciferol (VITAMIN D3) 2000 UNITS TABS Take 1 tablet by mouth daily.   Cyanocobalamin (B-12 PO) Take by mouth.   Ferrous Sulfate (IRON PO) Take by mouth.   gemfibrozil  (LOPID) 600 MG tablet TAKE 1 TABLET BY MOUTH TWICE DAILY BEFORE MEAL(S)   glipiZIDE (GLUCOTROL) 5 MG tablet 2 tablets in morning and 1 tablet at night   isosorbide mononitrate (IMDUR) 30 MG 24 hr tablet Take 1 tablet (30 mg total) by mouth daily. New 01/30/2018   lisinopril (ZESTRIL) 2.5 MG tablet Take 1 tablet (2.5 mg total) by mouth daily.   metoprolol tartrate (LOPRESSOR) 25 MG tablet Take 12.5 mg by mouth in the morning and at bedtime.   nitroGLYCERIN (NITROSTAT) 0.4 MG SL tablet Place 1 tablet (0.4 mg total) under the tongue every 5 (five) minutes x 3 doses as needed for chest pain (if no relief after 2nd dose, proceed to the ED for an evaluation).   Omega-3 Fatty Acids (FISH OIL) 1000 MG CAPS Take by mouth.   pantoprazole (PROTONIX) 40 MG tablet Take 1 tablet by mouth once daily   sodium bicarbonate 650 MG tablet Take by mouth.   traZODone (DESYREL) 100 MG tablet TAKE 1 TABLET BY MOUTH AT BEDTIME   No facility-administered encounter medications on file as of 02/20/2021.    Past Surgical History:  Procedure Laterality Date   CATARACT EXTRACTION, BILATERAL     CORONARY ANGIOPLASTY WITH STENT PLACEMENT  2006    Family History  Problem Relation Age of Onset   Aneurysm Father        Brain  aneurysm   Lung cancer Mother        Small cell carcinoma of lung,kidney and heart   Heart disease Mother    Cancer Brother        lungs   Diabetes Sister    Diabetes Sister    Diabetes Brother    Diabetes Brother     New complaints: None today  Social history: Lives by hisself- family check on him daily  Controlled substance contract: n/a     Review of Systems  Constitutional:  Negative for diaphoresis.  Eyes:  Negative for pain.  Respiratory:  Negative for shortness of breath.   Cardiovascular:  Negative for chest pain, palpitations and leg swelling.  Gastrointestinal:  Negative for abdominal pain.  Endocrine: Negative for polydipsia.  Skin:  Negative for rash.  Neurological:   Negative for dizziness, weakness and headaches.  Hematological:  Does not bruise/bleed easily.  All other systems reviewed and are negative.     Objective:   Physical Exam Vitals and nursing note reviewed.  Constitutional:      Appearance: Normal appearance. He is well-developed.  HENT:     Head: Normocephalic.     Nose: Nose normal.  Eyes:     Pupils: Pupils are equal, round, and reactive to light.  Neck:     Thyroid: No thyroid mass or thyromegaly.     Vascular: No carotid bruit or JVD.     Trachea: Phonation normal.  Cardiovascular:     Rate and Rhythm: Normal rate and regular rhythm.  Pulmonary:     Effort: Pulmonary effort is normal. No respiratory distress.     Breath sounds: Normal breath sounds.  Abdominal:     General: Bowel sounds are normal.     Palpations: Abdomen is soft.     Tenderness: There is no abdominal tenderness.  Musculoskeletal:        General: Normal range of motion.     Cervical back: Normal range of motion and neck supple.  Lymphadenopathy:     Cervical: No cervical adenopathy.  Skin:    General: Skin is warm and dry.  Neurological:     Mental Status: He is alert and oriented to person, place, and time.  Psychiatric:        Behavior: Behavior normal.        Thought Content: Thought content normal.        Judgment: Judgment normal.    BP 122/65   Pulse 65   Temp 97.9 F (36.6 C) (Temporal)   Resp 20   Ht 5' 10"  (1.778 m)   Wt 154 lb (69.9 kg)   SpO2 100%   BMI 22.10 kg/m        Assessment & Plan:  CHRIST FULLENWIDER comes in today with chief complaint of Medical Management of Chronic Issues   Diagnosis and orders addressed:  1. Essential hypertension Low sodium diet - CBC with Differential/Platelet - CMP14+EGFR - lisinopril (ZESTRIL) 2.5 MG tablet; Take 1 tablet (2.5 mg total) by mouth daily.  Dispense: 90 tablet; Refill: 1  2. Atherosclerosis of native coronary artery of native heart with angina pectoris ([place]) Keep follow  upnwith cardiology - isosorbide mononitrate (IMDUR) 30 MG 24 hr tablet; Take 1 tablet (30 mg total) by mouth daily. New 01/30/2018  Dispense: 90 tablet; Refill: 1 - metoprolol tartrate (LOPRESSOR) 25 MG tablet; Take 0.5 tablets (12.5 mg total) by mouth in the morning and at bedtime.  Dispense: 30 tablet; Refill: 11  3. Mixed  hyperlipidemia Low fat diet - Lipid panel - atorvastatin (LIPITOR) 40 MG tablet; Take 1 tablet (40 mg total) by mouth daily.  Dispense: 90 tablet; Refill: 1 - gemfibrozil (LOPID) 600 MG tablet; TAKE 1 TABLET BY MOUTH TWICE DAILY BEFORE MEAL(S)  Dispense: 180 tablet; Refill: 1  4. Stage 3b chronic kidney disease ([place]) Labs pending  5. Gastroesophageal reflux disease without esophagitis Avoid spicy foods Do not eat 2 hours prior to bedtime - pantoprazole (PROTONIX) 40 MG tablet; Take 1 tablet (40 mg total) by mouth daily.  Dispense: 90 tablet; Refill: 1  6. Primary insomnia Bedtime routine Try not to nap during the day - traZODone (DESYREL) 100 MG tablet; Take 1 tablet (100 mg total) by mouth at bedtime.  Dispense: 90 tablet; Refill: 0  7. Type 2 diabetes mellitus with stage 3b chronic kidney disease, without long-term current use of insulin (HCC) Low carb diet - Bayer DCA Hb A1c Waived - glipiZIDE (GLUCOTROL) 5 MG tablet; 2 tablets in morning and 1 tablet at night  Dispense: 270 tablet; Refill: 1   Labs pending Health Maintenance reviewed Diet and exercise encouraged  Follow up plan: 3 months   Mary-Margaret Hassell Done, FNP

## 2021-02-21 LAB — CMP14+EGFR
ALT: 70 IU/L — ABNORMAL HIGH (ref 0–44)
AST: 116 IU/L — ABNORMAL HIGH (ref 0–40)
Albumin/Globulin Ratio: 1.6 (ref 1.2–2.2)
Albumin: 3.9 g/dL (ref 3.8–4.8)
Alkaline Phosphatase: 52 IU/L (ref 44–121)
BUN/Creatinine Ratio: 10 (ref 10–24)
BUN: 19 mg/dL (ref 8–27)
Bilirubin Total: 1.2 mg/dL (ref 0.0–1.2)
CO2: 21 mmol/L (ref 20–29)
Calcium: 9.3 mg/dL (ref 8.6–10.2)
Chloride: 104 mmol/L (ref 96–106)
Creatinine, Ser: 1.82 mg/dL — ABNORMAL HIGH (ref 0.76–1.27)
Globulin, Total: 2.4 g/dL (ref 1.5–4.5)
Glucose: 87 mg/dL (ref 70–99)
Potassium: 4.7 mmol/L (ref 3.5–5.2)
Sodium: 139 mmol/L (ref 134–144)
Total Protein: 6.3 g/dL (ref 6.0–8.5)
eGFR: 40 mL/min/{1.73_m2} — ABNORMAL LOW (ref >59)

## 2021-02-21 LAB — CBC WITH DIFFERENTIAL/PLATELET
Basophils Absolute: 0 10*3/uL (ref 0.0–0.2)
Basos: 1 %
EOS (ABSOLUTE): 0.3 10*3/uL (ref 0.0–0.4)
Eos: 6 %
Hematocrit: 34.9 % — ABNORMAL LOW (ref 37.5–51.0)
Hemoglobin: 11.5 g/dL — ABNORMAL LOW (ref 13.0–17.7)
Immature Grans (Abs): 0 10*3/uL (ref 0.0–0.1)
Immature Granulocytes: 0 %
Lymphocytes Absolute: 1.7 10*3/uL (ref 0.7–3.1)
Lymphs: 34 %
MCH: 32.8 pg (ref 26.6–33.0)
MCHC: 33 g/dL (ref 31.5–35.7)
MCV: 99 fL — ABNORMAL HIGH (ref 79–97)
Monocytes Absolute: 0.4 10*3/uL (ref 0.1–0.9)
Monocytes: 9 %
Neutrophils Absolute: 2.5 10*3/uL (ref 1.4–7.0)
Neutrophils: 50 %
Platelets: 82 10*3/uL — CL (ref 150–450)
RBC: 3.51 x10E6/uL — ABNORMAL LOW (ref 4.14–5.80)
RDW: 13.1 % (ref 11.6–15.4)
WBC: 5 10*3/uL (ref 3.4–10.8)

## 2021-02-21 LAB — LIPID PANEL
Chol/HDL Ratio: 4.9 ratio (ref 0.0–5.0)
Cholesterol, Total: 118 mg/dL (ref 100–199)
HDL: 24 mg/dL — ABNORMAL LOW (ref >39)
LDL Chol Calc (NIH): 74 mg/dL (ref 0–99)
Triglycerides: 106 mg/dL (ref 0–149)
VLDL Cholesterol Cal: 20 mg/dL (ref 5–40)

## 2021-02-28 DIAGNOSIS — N189 Chronic kidney disease, unspecified: Secondary | ICD-10-CM | POA: Diagnosis not present

## 2021-02-28 DIAGNOSIS — E875 Hyperkalemia: Secondary | ICD-10-CM | POA: Diagnosis not present

## 2021-02-28 DIAGNOSIS — D696 Thrombocytopenia, unspecified: Secondary | ICD-10-CM | POA: Diagnosis not present

## 2021-02-28 DIAGNOSIS — D631 Anemia in chronic kidney disease: Secondary | ICD-10-CM | POA: Diagnosis not present

## 2021-02-28 DIAGNOSIS — E1122 Type 2 diabetes mellitus with diabetic chronic kidney disease: Secondary | ICD-10-CM | POA: Diagnosis not present

## 2021-03-08 DIAGNOSIS — E8722 Chronic metabolic acidosis: Secondary | ICD-10-CM | POA: Diagnosis not present

## 2021-03-08 DIAGNOSIS — E875 Hyperkalemia: Secondary | ICD-10-CM | POA: Diagnosis not present

## 2021-03-08 DIAGNOSIS — E1122 Type 2 diabetes mellitus with diabetic chronic kidney disease: Secondary | ICD-10-CM | POA: Diagnosis not present

## 2021-03-08 DIAGNOSIS — D631 Anemia in chronic kidney disease: Secondary | ICD-10-CM | POA: Diagnosis not present

## 2021-03-08 DIAGNOSIS — N189 Chronic kidney disease, unspecified: Secondary | ICD-10-CM | POA: Diagnosis not present

## 2021-03-08 DIAGNOSIS — D696 Thrombocytopenia, unspecified: Secondary | ICD-10-CM | POA: Diagnosis not present

## 2021-03-21 ENCOUNTER — Other Ambulatory Visit: Payer: Self-pay | Admitting: Nurse Practitioner

## 2021-03-21 DIAGNOSIS — E782 Mixed hyperlipidemia: Secondary | ICD-10-CM

## 2021-03-22 DIAGNOSIS — E1122 Type 2 diabetes mellitus with diabetic chronic kidney disease: Secondary | ICD-10-CM | POA: Diagnosis not present

## 2021-03-22 DIAGNOSIS — E8722 Chronic metabolic acidosis: Secondary | ICD-10-CM | POA: Diagnosis not present

## 2021-03-22 DIAGNOSIS — D696 Thrombocytopenia, unspecified: Secondary | ICD-10-CM | POA: Diagnosis not present

## 2021-03-22 DIAGNOSIS — N189 Chronic kidney disease, unspecified: Secondary | ICD-10-CM | POA: Diagnosis not present

## 2021-03-22 DIAGNOSIS — D631 Anemia in chronic kidney disease: Secondary | ICD-10-CM | POA: Diagnosis not present

## 2021-03-22 DIAGNOSIS — E875 Hyperkalemia: Secondary | ICD-10-CM | POA: Diagnosis not present

## 2021-03-28 DIAGNOSIS — L720 Epidermal cyst: Secondary | ICD-10-CM | POA: Diagnosis not present

## 2021-03-28 DIAGNOSIS — C44311 Basal cell carcinoma of skin of nose: Secondary | ICD-10-CM | POA: Diagnosis not present

## 2021-04-12 ENCOUNTER — Encounter: Payer: Self-pay | Admitting: Cardiology

## 2021-04-12 ENCOUNTER — Ambulatory Visit (INDEPENDENT_AMBULATORY_CARE_PROVIDER_SITE_OTHER): Payer: Medicare Other | Admitting: Cardiology

## 2021-04-12 VITALS — BP 94/50 | HR 59 | Ht 70.0 in | Wt 152.0 lb

## 2021-04-12 DIAGNOSIS — I25119 Atherosclerotic heart disease of native coronary artery with unspecified angina pectoris: Secondary | ICD-10-CM

## 2021-04-12 DIAGNOSIS — E782 Mixed hyperlipidemia: Secondary | ICD-10-CM

## 2021-04-12 DIAGNOSIS — N1832 Chronic kidney disease, stage 3b: Secondary | ICD-10-CM

## 2021-04-12 NOTE — Progress Notes (Signed)
Cardiology Office Note  Date: 04/12/2021   ID: Edward [name], DOB 05-Jun-1950, MRN [id number]  PCP:  [name], FNP  Cardiologist:  Rozann Lesches, MD Electrophysiologist:  None   Chief Complaint  Patient presents with   Cardiac follow-up    History of Present Illness: Edward [name] is a 70 y.o. male last seen in June.  He is here for a routine visit.  Reports stable angina symptoms with occasional nitroglycerin use.  Does not report progression in symptoms or change in ADL status.  He does mention that he feels lightheaded sometimes when he stands up.  Today's blood pressure was lower than prior.  We went over his medications which are noted below.  Overall stable from a cardiac perspective.  I did ask him to stop lisinopril at this point.  Otherwise, lab work from October is reviewed below.  Last LDL 74.  He continues to follow with Dr. Theador Hawthorne, CKD stage IIIb.  Recent creatinine 1.82 with normal potassium.  Past Medical History:  Diagnosis Date   Cataract    CKD (chronic kidney disease) stage 3, GFR 30-59 ml/min (HCC)    Coronary atherosclerosis of native coronary artery    BMS LAD 2006; residual 75% distal RCA; EF 50%   Essential hypertension    Glaucoma    Low back pain    MI (myocardial infarction) ([place])    Anterior 2006   Mixed hyperlipidemia    Renal insufficiency    Type 2 diabetes mellitus ([place])     Past Surgical History:  Procedure Laterality Date   CATARACT EXTRACTION, BILATERAL     CORONARY ANGIOPLASTY WITH STENT PLACEMENT  2006    Current Outpatient Medications  Medication Sig Dispense Refill   ACCU-CHEK AVIVA PLUS test strip USE TO CHECK BLOOD SUGAR ONCE DAILY 100 each 3   aspirin 81 MG tablet Take 81 mg by mouth daily.     atorvastatin (LIPITOR) 40 MG tablet Take 1 tablet by mouth once daily 90 tablet 0   brimonidine (ALPHAGAN) 0.2 % ophthalmic solution SMARTSIG:In Eye(s)     Cholecalciferol (VITAMIN D3) 2000 UNITS TABS Take 1 tablet  by mouth daily.     Cyanocobalamin (B-12 PO) Take by mouth.     Ferrous Sulfate (IRON PO) Take by mouth.     gemfibrozil (LOPID) 600 MG tablet TAKE 1 TABLET BY MOUTH TWICE DAILY BEFORE MEAL(S) 180 tablet 1   glipiZIDE (GLUCOTROL) 5 MG tablet 2 tablets in morning and 1 tablet at night 270 tablet 1   isosorbide mononitrate (IMDUR) 30 MG 24 hr tablet Take 1 tablet (30 mg total) by mouth daily. New 01/30/2018 90 tablet 1   metoprolol tartrate (LOPRESSOR) 25 MG tablet Take 0.5 tablets (12.5 mg total) by mouth in the morning and at bedtime. 30 tablet 11   nitroGLYCERIN (NITROSTAT) 0.4 MG SL tablet Place 1 tablet (0.4 mg total) under the tongue every 5 (five) minutes x 3 doses as needed for chest pain (if no relief after 2nd dose, proceed to the ED for an evaluation). 25 tablet 3   Omega-3 Fatty Acids (FISH OIL) 1000 MG CAPS Take by mouth.     pantoprazole (PROTONIX) 40 MG tablet Take 1 tablet (40 mg total) by mouth daily. 90 tablet 1   sodium bicarbonate 650 MG tablet Take by mouth.     traZODone (DESYREL) 100 MG tablet Take 1 tablet (100 mg total) by mouth at bedtime. 90 tablet 0   No current facility-administered medications  for this visit.   Allergies:  Patient has no known allergies.   ROS: No palpitations.  No frank syncope.  Physical Exam: VS:  BP (!) 94/50 (BP Location: Left Arm, Patient Position: Sitting, Cuff Size: Normal)   Pulse (!) 59   Ht 5\' 10"  (1.778 m)   Wt 152 lb (68.9 kg)   BMI 21.81 kg/m , BMI Body mass index is 21.81 kg/m.  Wt Readings from Last 3 Encounters:  04/12/21 152 lb (68.9 kg)  02/20/21 154 lb (69.9 kg)  12/30/20 154 lb (69.9 kg)    General: Patient appears comfortable at rest. HEENT: Conjunctiva and lids normal, wearing a mask. Neck: Supple, no elevated JVP or carotid bruits, no thyromegaly. Lungs: Clear to auscultation, nonlabored breathing at rest. Cardiac: Regular rate and rhythm, no S3 or significant systolic murmur, no pericardial rub. Extremities: No  pitting edema.  ECG:  An ECG dated 10/06/2020 was personally reviewed today and demonstrated:  Sinus rhythm with PACs, low voltage and decreased R wave progression.  Recent Labwork: 02/20/2021: ALT 70; AST 116; BUN 19; Creatinine, Ser 1.82; Hemoglobin 11.5; Platelets 82; Potassium 4.7; Sodium 139     Component Value Date/Time   CHOL 118 02/20/2021 0834   CHOL 177 09/25/2012 1015   TRIG 106 02/20/2021 0834   TRIG 206 (H) 10/07/2014 0927   TRIG 269 (H) 09/25/2012 1015   HDL 24 (L) 02/20/2021 0834   HDL 22 (L) 10/07/2014 0927   HDL 28 (L) 09/25/2012 1015   CHOLHDL 4.9 02/20/2021 0834   LDLCALC 74 02/20/2021 0834   LDLCALC 98 12/21/2013 0850   LDLCALC 95 09/25/2012 1015    Other Studies Reviewed Today:  [facility] Myoview 02/04/2018: There was no ST segment deviation noted during stress. Defect 1: There is a medium defect of severe severity present in the mid anteroseptal, apical anterior, apical septal and apex location. This is consistent with myocardial scar. Defect 2: There is a medium defect of moderate severity present in the mid inferolateral, apical inferior and apical lateral location. This is consistent with scar with a moderate degree of peri-infarct ischemia. Findings consistent with prior myocardial infarction with moderate peri-infarct ischemia in the inferolateral wall. This is an intermediate risk study. Nuclear stress EF: 53%.  Assessment and Plan:  1.  CAD status post BMS to the LAD in 2006 with moderate residual RCA disease that has been managed medically over time.  Last ischemic testing in 2019 revealed inferolateral infarct scar with moderate peri-infarct ischemia.  He has preferred to hold off on follow-up invasive cardiac evaluation and we continue medical therapy.  Currently on aspirin, Lopressor, Imdur, Lipitor, Lopid, and as needed nitroglycerin.  2.  Intermittent lightheadedness and relatively low blood pressure in the setting of essential hypertension.   Lisinopril is being discontinued at this point.  3.  CKD stage IIIb followed by Dr. Theador Hawthorne, recent creatinine 1.82.  4.  Mixed hyperlipidemia, on Lipitor and Lopid.  Last LDL 74.  Medication Adjustments/Labs and Tests Ordered: Current medicines are reviewed at length with the patient today.  Concerns regarding medicines are outlined above.   Tests Ordered: No orders of the defined types were placed in this encounter.   Medication Changes: No orders of the defined types were placed in this encounter.   Disposition:  Follow up  6 months.  Signed, Satira Sark, MD, [facility] 04/12/2021 9:00 AM    [facility] [place], [place], [place] [address] Phone: [phone]; Fax: [phone]  [phone]  

## 2021-04-12 NOTE — Patient Instructions (Addendum)
Medication Instructions:  Your physician has recommended you make the following change in your medication:  Stop lisinopril Continue other medications the same  Labwork: none  Testing/Procedures: none  Follow-Up: Your physician recommends that you schedule a follow-up appointment in: 6 months  Any Other Special Instructions Will Be Listed Below (If Applicable).  If you need a refill on your cardiac medications before your next appointment, please call your pharmacy.

## 2021-04-14 ENCOUNTER — Other Ambulatory Visit: Payer: Self-pay

## 2021-04-14 ENCOUNTER — Inpatient Hospital Stay (HOSPITAL_COMMUNITY): Payer: Medicare Other | Attending: Hematology

## 2021-04-14 DIAGNOSIS — I129 Hypertensive chronic kidney disease with stage 1 through stage 4 chronic kidney disease, or unspecified chronic kidney disease: Secondary | ICD-10-CM | POA: Diagnosis not present

## 2021-04-14 DIAGNOSIS — E610 Copper deficiency: Secondary | ICD-10-CM | POA: Insufficient documentation

## 2021-04-14 DIAGNOSIS — Z87891 Personal history of nicotine dependence: Secondary | ICD-10-CM | POA: Insufficient documentation

## 2021-04-14 DIAGNOSIS — D696 Thrombocytopenia, unspecified: Secondary | ICD-10-CM | POA: Diagnosis not present

## 2021-04-14 DIAGNOSIS — E1122 Type 2 diabetes mellitus with diabetic chronic kidney disease: Secondary | ICD-10-CM | POA: Diagnosis not present

## 2021-04-14 DIAGNOSIS — D539 Nutritional anemia, unspecified: Secondary | ICD-10-CM | POA: Diagnosis not present

## 2021-04-14 DIAGNOSIS — N1832 Chronic kidney disease, stage 3b: Secondary | ICD-10-CM | POA: Diagnosis not present

## 2021-04-14 DIAGNOSIS — D509 Iron deficiency anemia, unspecified: Secondary | ICD-10-CM

## 2021-04-14 DIAGNOSIS — Z801 Family history of malignant neoplasm of trachea, bronchus and lung: Secondary | ICD-10-CM | POA: Insufficient documentation

## 2021-04-14 LAB — COMPREHENSIVE METABOLIC PANEL WITH GFR
ALT: 24 U/L (ref 0–44)
AST: 44 U/L — ABNORMAL HIGH (ref 15–41)
Albumin: 3.6 g/dL (ref 3.5–5.0)
Alkaline Phosphatase: 56 U/L (ref 38–126)
Anion gap: 9 (ref 5–15)
BUN: 19 mg/dL (ref 8–23)
CO2: 22 mmol/L (ref 22–32)
Calcium: 9.4 mg/dL (ref 8.9–10.3)
Chloride: 106 mmol/L (ref 98–111)
Creatinine, Ser: 1.74 mg/dL — ABNORMAL HIGH (ref 0.61–1.24)
GFR, Estimated: 42 mL/min — ABNORMAL LOW
Glucose, Bld: 126 mg/dL — ABNORMAL HIGH (ref 70–99)
Potassium: 4.4 mmol/L (ref 3.5–5.1)
Sodium: 137 mmol/L (ref 135–145)
Total Bilirubin: 1.7 mg/dL — ABNORMAL HIGH (ref 0.3–1.2)
Total Protein: 6.9 g/dL (ref 6.5–8.1)

## 2021-04-14 LAB — CBC WITH DIFFERENTIAL/PLATELET
Abs Immature Granulocytes: 0.01 K/uL (ref 0.00–0.07)
Basophils Absolute: 0 K/uL (ref 0.0–0.1)
Basophils Relative: 1 %
Eosinophils Absolute: 0.1 K/uL (ref 0.0–0.5)
Eosinophils Relative: 3 %
HCT: 34.1 % — ABNORMAL LOW (ref 39.0–52.0)
Hemoglobin: 11.9 g/dL — ABNORMAL LOW (ref 13.0–17.0)
Immature Granulocytes: 0 %
Lymphocytes Relative: 39 %
Lymphs Abs: 1.3 K/uL (ref 0.7–4.0)
MCH: 34.9 pg — ABNORMAL HIGH (ref 26.0–34.0)
MCHC: 34.9 g/dL (ref 30.0–36.0)
MCV: 100 fL (ref 80.0–100.0)
Monocytes Absolute: 0.3 K/uL (ref 0.1–1.0)
Monocytes Relative: 8 %
Neutro Abs: 1.7 K/uL (ref 1.7–7.7)
Neutrophils Relative %: 49 %
Platelets: 71 K/uL — ABNORMAL LOW (ref 150–400)
RBC: 3.41 MIL/uL — ABNORMAL LOW (ref 4.22–5.81)
RDW: 13.4 % (ref 11.5–15.5)
WBC: 3.5 K/uL — ABNORMAL LOW (ref 4.0–10.5)
nRBC: 0 % (ref 0.0–0.2)

## 2021-04-14 LAB — IRON AND TIBC
Iron: 108 ug/dL (ref 45–182)
Saturation Ratios: 32 % (ref 17.9–39.5)
TIBC: 337 ug/dL (ref 250–450)
UIBC: 229 ug/dL

## 2021-04-14 LAB — FERRITIN: Ferritin: 79 ng/mL (ref 24–336)

## 2021-04-17 LAB — COPPER, SERUM: Copper: 58 ug/dL — ABNORMAL LOW (ref 69–132)

## 2021-04-20 NOTE — Progress Notes (Addendum)
[place], [place] [address]   CLINIC:  Medical Oncology/Hematology  PCP:  [name], [address] [place] [address] [phone]   REASON FOR VISIT:  Follow-up for thrombocytopenia and macrocytic anemia  PRIOR THERAPY: None  CURRENT THERAPY: Observation  INTERVAL HISTORY:  Edward [name] 70 y.o. male returns for routine follow-up of his low platelets and macrocytosis.  He was last seen by Tarri Abernethy PA-C on 12/15/2020.  At today's visit, he reports feeling like his usual self.  He reports that he had some skin cancer (unspecified type) removed from near his left eye about 1 month ago. No other recent hospitalizations, surgeries, or changes in baseline health status.  He continues to have chronic fatigue with energy about 40%.  He denies any B symptoms such as fever, chills, night sweats, unintentional weight loss.  He reports that he has a good appetite and has not experienced any early satiety, nausea, or abdominal pain.  He denies any abnormal bleeding such as hematemesis, hematochezia, melena, or epistaxis.  No abnormal bruising or petechial rash.  He has 40% energy and 100% appetite. He endorses that he is maintaining a stable weight.   REVIEW OF SYSTEMS:  Review of Systems  Constitutional:  Positive for fatigue. Negative for appetite change, chills, diaphoresis, fever and unexpected weight change.  HENT:   Negative for lump/mass and nosebleeds.   Eyes:  Negative for eye problems.  Respiratory:  Negative for cough, hemoptysis and shortness of breath.   Cardiovascular:  Positive for palpitations. Negative for chest pain and leg swelling.  Gastrointestinal:  Negative for abdominal pain, blood in stool, constipation, diarrhea, nausea and vomiting.  Genitourinary:  Negative for hematuria.   Skin: Negative.   Neurological:  Positive for dizziness. Negative for headaches and light-headedness.  Hematological:   Does not bruise/bleed easily.     PAST MEDICAL/SURGICAL HISTORY:  Past Medical History:  Diagnosis Date   Cataract    CKD (chronic kidney disease) stage 3, GFR 30-59 ml/min (HCC)    Coronary atherosclerosis of native coronary artery    BMS LAD 2006; residual 75% distal RCA; EF 50%   Essential hypertension    Glaucoma    Low back pain    MI (myocardial infarction) ([place])    Anterior 2006   Mixed hyperlipidemia    Renal insufficiency    Type 2 diabetes mellitus ([place])    Past Surgical History:  Procedure Laterality Date   CATARACT EXTRACTION, BILATERAL     CORONARY ANGIOPLASTY WITH STENT PLACEMENT  2006     SOCIAL HISTORY:  Social History   Socioeconomic History   Marital status: Legally Separated    Spouse name: [name]    Number of children: 2   Years of education: 7th   Highest education level: 7th grade  Occupational History   Occupation: Retired  Tobacco Use   Smoking status: Former    Packs/day: 2.00    Years: 20.00    Pack years: 40.00    Types: Cigarettes    Quit date: 05/08/1987    Years since quitting: 33.9   Smokeless tobacco: Never  Vaping Use   Vaping Use: Never used  Substance and Sexual Activity   Alcohol use: No    Alcohol/week: 0.0 standard drinks   Drug use: No   Sexual activity: Not Currently  Other Topics Concern   Not on file  Social History Narrative   Lives alone    Social Determinants of  Health   Financial Resource Strain: Not on file  Food Insecurity: Not on file  Transportation Needs: Not on file  Physical Activity: Not on file  Stress: Not on file  Social Connections: Not on file  Intimate Partner Violence: Not on file    FAMILY HISTORY:  Family History  Problem Relation Age of Onset   Aneurysm Father        Brain aneurysm   Lung cancer Mother        Small cell carcinoma of lung,kidney and heart   Heart disease Mother    Cancer Brother        lungs   Diabetes Sister    Diabetes Sister    Diabetes Brother     Diabetes Brother     CURRENT MEDICATIONS:  Outpatient Encounter Medications as of 04/21/2021  Medication Sig   ACCU-CHEK AVIVA PLUS test strip USE TO CHECK BLOOD SUGAR ONCE DAILY   aspirin 81 MG tablet Take 81 mg by mouth daily.   atorvastatin (LIPITOR) 40 MG tablet Take 1 tablet by mouth once daily   brimonidine (ALPHAGAN) 0.2 % ophthalmic solution SMARTSIG:In Eye(s)   Cholecalciferol (VITAMIN D3) 2000 UNITS TABS Take 1 tablet by mouth daily.   Cyanocobalamin (B-12 PO) Take by mouth.   Ferrous Sulfate (IRON PO) Take by mouth.   gemfibrozil (LOPID) 600 MG tablet TAKE 1 TABLET BY MOUTH TWICE DAILY BEFORE MEAL(S)   glipiZIDE (GLUCOTROL) 5 MG tablet 2 tablets in morning and 1 tablet at night   isosorbide mononitrate (IMDUR) 30 MG 24 hr tablet Take 1 tablet (30 mg total) by mouth daily. New 01/30/2018   metoprolol tartrate (LOPRESSOR) 25 MG tablet Take 0.5 tablets (12.5 mg total) by mouth in the morning and at bedtime.   nitroGLYCERIN (NITROSTAT) 0.4 MG SL tablet Place 1 tablet (0.4 mg total) under the tongue every 5 (five) minutes x 3 doses as needed for chest pain (if no relief after 2nd dose, proceed to the ED for an evaluation).   Omega-3 Fatty Acids (FISH OIL) 1000 MG CAPS Take by mouth.   pantoprazole (PROTONIX) 40 MG tablet Take 1 tablet (40 mg total) by mouth daily.   sodium bicarbonate 650 MG tablet Take by mouth.   traZODone (DESYREL) 100 MG tablet Take 1 tablet (100 mg total) by mouth at bedtime.   No facility-administered encounter medications on file as of 04/21/2021.    ALLERGIES:  No Known Allergies   PHYSICAL EXAM:  ECOG PERFORMANCE STATUS: 1 - Symptomatic but completely ambulatory  There were no vitals filed for this visit. There were no vitals filed for this visit. Physical Exam Constitutional:      Appearance: Normal appearance.  HENT:     Head: Normocephalic and atraumatic.     Mouth/Throat:     Mouth: Mucous membranes are moist.  Eyes:     Extraocular  Movements: Extraocular movements intact.     Pupils: Pupils are equal, round, and reactive to light.  Cardiovascular:     Rate and Rhythm: Normal rate and regular rhythm.     Pulses: Normal pulses.     Heart sounds: Normal heart sounds.  Pulmonary:     Effort: Pulmonary effort is normal.     Breath sounds: Normal breath sounds.  Abdominal:     General: Bowel sounds are normal.     Palpations: Abdomen is soft.     Tenderness: There is no abdominal tenderness.     Comments: Edge of liver palpated below rib margins.  No splenomegaly appreciated on exam.  Musculoskeletal:        General: No swelling.     Right lower leg: No edema.     Left lower leg: No edema.  Lymphadenopathy:     Cervical: No cervical adenopathy.  Skin:    General: Skin is warm and dry.  Neurological:     General: No focal deficit present.     Mental Status: He is alert and oriented to person, place, and time.  Psychiatric:        Mood and Affect: Mood normal.        Behavior: Behavior normal.     LABORATORY DATA:  I have reviewed the labs as listed.  CBC    Component Value Date/Time   WBC 3.5 (L) 04/14/2021 0817   RBC 3.41 (L) 04/14/2021 0817   HGB 11.9 (L) 04/14/2021 0817   HGB 11.5 (L) 02/20/2021 0834   HCT 34.1 (L) 04/14/2021 0817   HCT 34.9 (L) 02/20/2021 0834   PLT 71 (L) 04/14/2021 0817   PLT 82 (LL) 02/20/2021 0834   MCV 100.0 04/14/2021 0817   MCV 99 (H) 02/20/2021 0834   MCH 34.9 (H) 04/14/2021 0817   MCHC 34.9 04/14/2021 0817   RDW 13.4 04/14/2021 0817   RDW 13.1 02/20/2021 0834   LYMPHSABS 1.3 04/14/2021 0817   LYMPHSABS 1.7 02/20/2021 0834   MONOABS 0.3 04/14/2021 0817   EOSABS 0.1 04/14/2021 0817   EOSABS 0.3 02/20/2021 0834   BASOSABS 0.0 04/14/2021 0817   BASOSABS 0.0 02/20/2021 0834   CMP Latest Ref Rng & Units 04/14/2021 02/20/2021 12/08/2020  Glucose 70 - 99 mg/dL 126(H) 87 71  BUN 8 - 23 mg/dL 19 19 16   Creatinine 0.61 - 1.24 mg/dL 1.74(H) 1.82(H) 1.64(H)  Sodium 135 - 145  mmol/L 137 139 140  Potassium 3.5 - 5.1 mmol/L 4.4 4.7 5.1  Chloride 98 - 111 mmol/L 106 104 107  CO2 22 - 32 mmol/L 22 21 22   Calcium 8.9 - 10.3 mg/dL 9.4 9.3 9.6  Total Protein 6.5 - 8.1 g/dL 6.9 6.3 7.1  Total Bilirubin 0.3 - 1.2 mg/dL 1.7(H) 1.2 1.9(H)  Alkaline Phos 38 - 126 U/L 56 52 42  AST 15 - 41 U/L 44(H) 116(H) 42(H)  ALT 0 - 44 U/L 24 70(H) 22    DIAGNOSTIC IMAGING:  I have independently reviewed the relevant imaging and discussed with the patient.  ASSESSMENT & PLAN: 1.   Moderate thrombocytopenia: - Patient has had thrombocytopenia noted since at least May 2021 - last results before 2021 showed normal platelets at 2009 - Has mild bruising on the upper extremities but no active bleeding such as hematemesis, hematochezia, melena, or epistaxis.- He does not have any B symptoms.  Denies any history of hepatitis or history of blood transfusion.  - Abdominal ultrasound (12/21/2020): Mild splenomegaly with volume 895 mL.  No liver abnormalities noted. - Hepatitis panel checked in 2021 was negative; patient was POSITIVE for H. Pylori IgG antibodies.  No B12 or folate deficiencies. - Most recent labs (04/14/2021): Platelets 71, WBC 3.5 with normal differential.  Copper remains mildly low at 58. - Differential diagnosis includes thrombocytopenia related to mild copper deficiency versus chronic immune mediated thrombocytopenia versus MDS or other bone marrow disorder.  Mild splenomegaly noted on ultrasound, but no known liver disease. - Discussed possible early MDS with patient, but we will wait on any bone marrow biopsy until copper has been repleted.  Additionally, patient would like to defer  bone marrow biopsy and continue with watchful waiting for the time being. - PLAN: RTC in 4 months for repeat labs   -Treatment of copper deficiency as below   2.  Macrocytic anemia +/- iron deficiency: - Hemoglobin over the past 12 months ranges from 10.2 - 12.2, with mild macrocytosis - No EGD  or colonoscopy on file - SPEP in June 2021 was normal.  Erythropoietin normal at 11.9. - No epistaxis, hematochezia, or melena   - Currently taking ferrous sulfate 325 mg twice daily - Most recent labs (04/14/2021): Hgb 11.9/MCV 100.0, ferritin 79, iron saturation 32%.  Creatinine 1.74/GFR 42 (CKD stage IIIb at baseline) - Differential diagnosis favors combination anemia from CKD IIIb and relative iron deficiency.  Macrocytosis may be secondary to mild copper deficiency.  May be an element of possible early MDS  - PLAN: Continue ferrous sulfate ONCE daily.  We will continue to watch iron levels closely in light of his underlying CKD.  Current levels are marginal but acceptable without any IV iron supplementation - RTC in 4 months for repeat labs (CBC, CMP, iron panel) and office visit. - If hemoglobin drops to less than 10.0, we will consider using ESA. - Treatment of copper deficiency as below   3.  Copper deficiency -Persistent copper deficiency despite oral supplementation - Most recent level (04/14/2021): Copper mildly low at 58 - Patient is taking OTC copper supplementation once daily.  He is not taking any zinc. - PLAN: Increase copper supplement to twice daily. - We will recheck copper levels as well and ceruloplasmin and zinc at next lab draw.  4.  CKD stage IIIb: -He has CKD with creatinine ranging from 1.4-1.8. -Renal ultrasound on 02/07/2016 shows mild bilateral lobular renal contour.  Scarring cannot be excluded.  No mass or hydronephrosis. - PLAN: Continue follow-up with Dr. Theador Hawthorne    PLAN SUMMARY & DISPOSITION: Labs in 4 months RTC after labs  All questions were answered. The patient knows to call the clinic with any problems, questions or concerns.  Medical decision making: Moderate  Time spent on visit: I spent 20 minutes counseling the patient face to face. The total time spent in the appointment was 30 minutes and more than 50% was on counseling.   Harriett Rush, PA-C  04/21/2021 9:04 AM

## 2021-04-21 ENCOUNTER — Inpatient Hospital Stay (HOSPITAL_BASED_OUTPATIENT_CLINIC_OR_DEPARTMENT_OTHER): Payer: Medicare Other | Admitting: Physician Assistant

## 2021-04-21 ENCOUNTER — Other Ambulatory Visit: Payer: Self-pay

## 2021-04-21 VITALS — BP 125/74 | HR 72 | Temp 98.1°F | Resp 16 | Ht 70.0 in | Wt 153.0 lb

## 2021-04-21 DIAGNOSIS — D509 Iron deficiency anemia, unspecified: Secondary | ICD-10-CM | POA: Diagnosis not present

## 2021-04-21 DIAGNOSIS — D696 Thrombocytopenia, unspecified: Secondary | ICD-10-CM

## 2021-04-21 DIAGNOSIS — E610 Copper deficiency: Secondary | ICD-10-CM | POA: Diagnosis not present

## 2021-04-21 DIAGNOSIS — E1122 Type 2 diabetes mellitus with diabetic chronic kidney disease: Secondary | ICD-10-CM | POA: Diagnosis not present

## 2021-04-21 DIAGNOSIS — N1832 Chronic kidney disease, stage 3b: Secondary | ICD-10-CM | POA: Diagnosis not present

## 2021-04-21 DIAGNOSIS — D539 Nutritional anemia, unspecified: Secondary | ICD-10-CM

## 2021-04-21 DIAGNOSIS — I129 Hypertensive chronic kidney disease with stage 1 through stage 4 chronic kidney disease, or unspecified chronic kidney disease: Secondary | ICD-10-CM | POA: Diagnosis not present

## 2021-04-21 NOTE — Patient Instructions (Signed)
[place] at [facility] Discharge Instructions  You were seen today by Tarri Abernethy PA-C for your low platelets (thrombocytopenia) and your low blood count (anemia).  As we discussed, we do not yet know the exact cause of your low platelets.  It may be some immune system dysfunction that is causing your body to attack your platelets.  However, since you also have a mildly enlarged spleen without other explanation, it is possible that you may have an underlying bone marrow disorder/low-grade cancer known as MDS.  We would like to get a bone marrow biopsy to further investigate your bone marrow to see if this is what is causing your platelets to be low.  Your low blood count (anemia) may also be related to some underlying bone marrow dysfunction, or it may be a result of your chronic kidney disease.  You do not require treatment for your anemia at this time, but we will continue to watch it closely.  LABS: Return in 4 months for repeat labs  OTHER TESTS: No other tests at this time, unless you change your mind and decide that you would like to proceed with bone marrow biopsy.  MEDICATIONS: Increase copper supplement to twice daily.  FOLLOW-UP APPOINTMENT: Office visit in 4 months, after labs   Thank you for choosing [place] at [facility] to provide your oncology and hematology care.  To afford each patient quality time with our provider, please arrive at least 15 minutes before your scheduled appointment time.   If you have a lab appointment with the [facility] please come in thru the Main Entrance and check in at the main information desk.  You need to re-schedule your appointment should you arrive 10 or more minutes late.  We strive to give you quality time with our providers, and arriving late affects you and other patients whose appointments are after yours.  Also, if you no show three or more times for appointments you may be  dismissed from the clinic at the providers discretion.     Again, thank you for choosing [facility].  Our hope is that these requests will decrease the amount of time that you wait before being seen by our physicians.       _____________________________________________________________  Should you have questions after your visit to [facility], please contact our office at [phone] and follow the prompts.  Our office hours are 8:00 a.m. and 4:30 p.m. Monday - Friday.  Please note that voicemails left after 4:00 p.m. may not be returned until the following business day.  We are closed weekends and major holidays.  You do have access to a nurse 24-7, just call the main number to the clinic [phone] and do not press any options, hold on the line and a nurse will answer the phone.    For prescription refill requests, have your pharmacy contact our office and allow 72 hours.    Due to Covid, you will need to wear a mask upon entering the hospital. If you do not have a mask, a mask will be given to you at the Main Entrance upon arrival. For doctor visits, patients may have 1 support person age 51 or older with them. For treatment visits, patients can not have anyone with them due to social distancing guidelines and our immunocompromised population.

## 2021-04-21 NOTE — Addendum Note (Signed)
Addended by: Tarri Abernethy on: 04/21/2021 11:54 AM   Modules accepted: Orders

## 2021-05-02 DIAGNOSIS — Z85828 Personal history of other malignant neoplasm of skin: Secondary | ICD-10-CM | POA: Diagnosis not present

## 2021-05-02 DIAGNOSIS — Z08 Encounter for follow-up examination after completed treatment for malignant neoplasm: Secondary | ICD-10-CM | POA: Diagnosis not present

## 2021-05-11 DIAGNOSIS — E8722 Chronic metabolic acidosis: Secondary | ICD-10-CM | POA: Diagnosis not present

## 2021-05-11 DIAGNOSIS — N189 Chronic kidney disease, unspecified: Secondary | ICD-10-CM | POA: Diagnosis not present

## 2021-05-11 DIAGNOSIS — D696 Thrombocytopenia, unspecified: Secondary | ICD-10-CM | POA: Diagnosis not present

## 2021-05-11 DIAGNOSIS — D631 Anemia in chronic kidney disease: Secondary | ICD-10-CM | POA: Diagnosis not present

## 2021-05-11 DIAGNOSIS — E875 Hyperkalemia: Secondary | ICD-10-CM | POA: Diagnosis not present

## 2021-05-11 DIAGNOSIS — E1122 Type 2 diabetes mellitus with diabetic chronic kidney disease: Secondary | ICD-10-CM | POA: Diagnosis not present

## 2021-05-18 DIAGNOSIS — D631 Anemia in chronic kidney disease: Secondary | ICD-10-CM | POA: Diagnosis not present

## 2021-05-18 DIAGNOSIS — E875 Hyperkalemia: Secondary | ICD-10-CM | POA: Diagnosis not present

## 2021-05-18 DIAGNOSIS — E1122 Type 2 diabetes mellitus with diabetic chronic kidney disease: Secondary | ICD-10-CM | POA: Diagnosis not present

## 2021-05-18 DIAGNOSIS — D696 Thrombocytopenia, unspecified: Secondary | ICD-10-CM | POA: Diagnosis not present

## 2021-05-18 DIAGNOSIS — R809 Proteinuria, unspecified: Secondary | ICD-10-CM | POA: Diagnosis not present

## 2021-05-18 DIAGNOSIS — N189 Chronic kidney disease, unspecified: Secondary | ICD-10-CM | POA: Diagnosis not present

## 2021-05-23 ENCOUNTER — Encounter: Payer: Self-pay | Admitting: Nurse Practitioner

## 2021-05-23 ENCOUNTER — Ambulatory Visit (INDEPENDENT_AMBULATORY_CARE_PROVIDER_SITE_OTHER): Payer: Medicare Other | Admitting: Nurse Practitioner

## 2021-05-23 VITALS — BP 139/66 | HR 55 | Temp 98.1°F | Resp 20 | Ht 70.0 in | Wt 158.0 lb

## 2021-05-23 DIAGNOSIS — I25119 Atherosclerotic heart disease of native coronary artery with unspecified angina pectoris: Secondary | ICD-10-CM

## 2021-05-23 DIAGNOSIS — N1832 Chronic kidney disease, stage 3b: Secondary | ICD-10-CM

## 2021-05-23 DIAGNOSIS — I10 Essential (primary) hypertension: Secondary | ICD-10-CM | POA: Diagnosis not present

## 2021-05-23 DIAGNOSIS — F5101 Primary insomnia: Secondary | ICD-10-CM

## 2021-05-23 DIAGNOSIS — E782 Mixed hyperlipidemia: Secondary | ICD-10-CM

## 2021-05-23 DIAGNOSIS — E1122 Type 2 diabetes mellitus with diabetic chronic kidney disease: Secondary | ICD-10-CM | POA: Diagnosis not present

## 2021-05-23 DIAGNOSIS — K219 Gastro-esophageal reflux disease without esophagitis: Secondary | ICD-10-CM | POA: Diagnosis not present

## 2021-05-23 LAB — BAYER DCA HB A1C WAIVED: HB A1C (BAYER DCA - WAIVED): 7.4 % — ABNORMAL HIGH (ref 4.8–5.6)

## 2021-05-23 NOTE — Patient Instructions (Signed)

## 2021-05-23 NOTE — Progress Notes (Signed)
Subjective:    Patient ID: Edward [name], male    DOB: 04/07/1951, 71 y.o.   MRN: [id number]   Chief Complaint: medical management of chronic issues     HPI:  Edward [name] is a 71 y.o. who identifies as a male who was assigned male at birth.   Social history: Lives with: by hisself Work history: retired   Scientist, forensic in today for follow up of the following chronic medical issues:  1. Essential hypertension No c/po chest pain, sob or headache. Does not check blood pressure at home. BP Readings from Last 3 Encounters:  04/21/21 125/74  04/12/21 (!) 94/50  02/20/21 122/65    .  2. Atherosclerosis of native coronary artery of native heart with angina pectoris [facility]) Last saw cardiology on 04/12/22. Review of office note revealed no change in plan of care. He is to follow up every 6 months.  3. Mixed hyperlipidemia Does not watch diet and does no dedicated exercise. Lab Results  Component Value Date   CHOL 118 02/20/2021   HDL 24 (L) 02/20/2021   LDLCALC 74 02/20/2021   TRIG 106 02/20/2021   CHOLHDL 4.9 02/20/2021  The ASCVD Risk score (Arnett DK, et al., 2019) failed to calculate for the following reasons:   The valid total cholesterol range is 130 to 320 mg/dL    4. Type 2 diabetes mellitus with stage 3b chronic kidney disease, without long-term current use of insulin ([place]) He does not check his blood suagrs at home.  5. Stage 3b chronic kidney disease ([place]) No issues with voiding Lab Results  Component Value Date   CREATININE 1.74 (H) 04/14/2021     6. Gastroesophageal reflux disease without esophagitis Protonix works well for him to keep symptoms under control.  7. Primary insomnia Is on trazadone to sleep with no issues.   New complaints: None today  No Known Allergies Outpatient Encounter Medications as of 05/23/2021  Medication Sig   ACCU-CHEK AVIVA PLUS test strip USE TO CHECK BLOOD SUGAR ONCE DAILY   aspirin 81 MG tablet Take 81 mg by mouth  daily.   atorvastatin (LIPITOR) 40 MG tablet Take 1 tablet by mouth once daily   brimonidine (ALPHAGAN) 0.2 % ophthalmic solution SMARTSIG:In Eye(s)   Cholecalciferol (VITAMIN D3) 2000 UNITS TABS Take 1 tablet by mouth daily.   Cyanocobalamin (B-12 PO) Take by mouth.   ferrous gluconate (FERGON) 324 MG tablet Take by mouth.   gemfibrozil (LOPID) 600 MG tablet TAKE 1 TABLET BY MOUTH TWICE DAILY BEFORE MEAL(S)   glipiZIDE (GLUCOTROL) 5 MG tablet 2 tablets in morning and 1 tablet at night   isosorbide mononitrate (IMDUR) 30 MG 24 hr tablet Take 1 tablet (30 mg total) by mouth daily. New 01/30/2018   LOKELMA 5 g packet SMARTSIG:5 Gram(s) By Mouth Twice a Week   metoprolol tartrate (LOPRESSOR) 25 MG tablet Take 0.5 tablets (12.5 mg total) by mouth in the morning and at bedtime.   nitroGLYCERIN (NITROSTAT) 0.4 MG SL tablet Place 1 tablet (0.4 mg total) under the tongue every 5 (five) minutes x 3 doses as needed for chest pain (if no relief after 2nd dose, proceed to the ED for an evaluation). (Patient not taking: Reported on 04/21/2021)   Omega-3 Fatty Acids (FISH OIL) 1000 MG CAPS Take by mouth.   pantoprazole (PROTONIX) 40 MG tablet Take 1 tablet (40 mg total) by mouth daily.   sodium bicarbonate 650 MG tablet Take by mouth.   traZODone (DESYREL) 100 MG  tablet Take 1 tablet (100 mg total) by mouth at bedtime.   No facility-administered encounter medications on file as of 05/23/2021.    Past Surgical History:  Procedure Laterality Date   CATARACT EXTRACTION, BILATERAL     CORONARY ANGIOPLASTY WITH STENT PLACEMENT  2006    Family History  Problem Relation Age of Onset   Aneurysm Father        Brain aneurysm   Lung cancer Mother        Small cell carcinoma of lung,kidney and heart   Heart disease Mother    Cancer Brother        lungs   Diabetes Sister    Diabetes Sister    Diabetes Brother    Diabetes Brother       Controlled substance contract: n/a     Review of Systems   Constitutional:  Negative for diaphoresis.  Eyes:  Negative for pain.  Respiratory:  Negative for shortness of breath.   Cardiovascular:  Negative for chest pain, palpitations and leg swelling.  Gastrointestinal:  Negative for abdominal pain.  Endocrine: Negative for polydipsia.  Skin:  Negative for rash.  Neurological:  Negative for dizziness, weakness and headaches.  Hematological:  Does not bruise/bleed easily.  All other systems reviewed and are negative.     Objective:   Physical Exam Vitals and nursing note reviewed.  Constitutional:      Appearance: Normal appearance. He is well-developed.  HENT:     Head: Normocephalic.     Nose: Nose normal.     Mouth/Throat:     Mouth: Mucous membranes are moist.     Pharynx: Oropharynx is clear.  Eyes:     Pupils: Pupils are equal, round, and reactive to light.  Neck:     Thyroid: No thyroid mass or thyromegaly.     Vascular: No carotid bruit or JVD.     Trachea: Phonation normal.  Cardiovascular:     Rate and Rhythm: Normal rate and regular rhythm.  Pulmonary:     Effort: Pulmonary effort is normal. No respiratory distress.     Breath sounds: Normal breath sounds.  Abdominal:     General: Bowel sounds are normal.     Palpations: Abdomen is soft.     Tenderness: There is no abdominal tenderness.  Musculoskeletal:        General: Normal range of motion.     Cervical back: Normal range of motion and neck supple.  Lymphadenopathy:     Cervical: No cervical adenopathy.  Skin:    General: Skin is warm and dry.  Neurological:     Mental Status: He is alert and oriented to person, place, and time.  Psychiatric:        Behavior: Behavior normal.        Thought Content: Thought content normal.        Judgment: Judgment normal.    BP 139/66    Pulse (!) 55    Temp 98.1 F (36.7 C) (Temporal)    Resp 20    Ht 5' 10"  (1.778 m)    Wt 158 lb (71.7 kg)    SpO2 99%    BMI 22.67 kg/m  Hgba1c discussed at appointment 7.4%       Assessment & Plan:   Edward [name] [facility] comes in today with chief complaint of Medical Management of Chronic Issues   Diagnosis and orders addressed:  1. Essential hypertension Low sodium diet - CBC with Differential/Platelet - CMP14+EGFR  2. Atherosclerosis  of native coronary artery of native heart with angina pectoris ([place]) Report any chest pain or SOB Keep follow up with cardiology  3. Mixed hyperlipidemia Low fat diet - Lipid panel  4. Type 2 diabetes mellitus with stage 3b chronic kidney disease, without long-term current use of insulin (HCC) Continue to avoid sweets in diet - Bayer DCA Hb A1c Waived  5. Stage 3b chronic kidney disease ([place]) Labs pending  6. Gastroesophageal reflux disease without esophagitis Avoid spicy foods Do not eat 2 hours prior to bedtime  7. Primary insomnia Bedtime routine   Labs pending Health Maintenance reviewed Diet and exercise encouraged  Follow up plan: 3 months   Mary-Margaret Hassell Done, FNP

## 2021-05-24 LAB — CBC WITH DIFFERENTIAL/PLATELET
Basophils Absolute: 0 10*3/uL (ref 0.0–0.2)
Basos: 0 %
EOS (ABSOLUTE): 0.1 10*3/uL (ref 0.0–0.4)
Eos: 4 %
Hematocrit: 34.3 % — ABNORMAL LOW (ref 37.5–51.0)
Hemoglobin: 11.9 g/dL — ABNORMAL LOW (ref 13.0–17.7)
Immature Grans (Abs): 0 10*3/uL (ref 0.0–0.1)
Immature Granulocytes: 0 %
Lymphocytes Absolute: 1.3 10*3/uL (ref 0.7–3.1)
Lymphs: 41 %
MCH: 33 pg (ref 26.6–33.0)
MCHC: 34.7 g/dL (ref 31.5–35.7)
MCV: 95 fL (ref 79–97)
Monocytes Absolute: 0.2 10*3/uL (ref 0.1–0.9)
Monocytes: 7 %
Neutrophils Absolute: 1.5 10*3/uL (ref 1.4–7.0)
Neutrophils: 48 %
Platelets: 63 10*3/uL — CL (ref 150–450)
RBC: 3.61 x10E6/uL — ABNORMAL LOW (ref 4.14–5.80)
RDW: 12.8 % (ref 11.6–15.4)
WBC: 3.1 10*3/uL — ABNORMAL LOW (ref 3.4–10.8)

## 2021-05-24 LAB — CMP14+EGFR
ALT: 15 IU/L (ref 0–44)
AST: 33 IU/L (ref 0–40)
Albumin/Globulin Ratio: 1.5 (ref 1.2–2.2)
Albumin: 4 g/dL (ref 3.8–4.8)
Alkaline Phosphatase: 56 IU/L (ref 44–121)
BUN/Creatinine Ratio: 8 — ABNORMAL LOW (ref 10–24)
BUN: 13 mg/dL (ref 8–27)
Bilirubin Total: 2.1 mg/dL — ABNORMAL HIGH (ref 0.0–1.2)
CO2: 24 mmol/L (ref 20–29)
Calcium: 9.5 mg/dL (ref 8.6–10.2)
Chloride: 105 mmol/L (ref 96–106)
Creatinine, Ser: 1.56 mg/dL — ABNORMAL HIGH (ref 0.76–1.27)
Globulin, Total: 2.6 g/dL (ref 1.5–4.5)
Glucose: 124 mg/dL — ABNORMAL HIGH (ref 70–99)
Potassium: 4.1 mmol/L (ref 3.5–5.2)
Sodium: 144 mmol/L (ref 134–144)
Total Protein: 6.6 g/dL (ref 6.0–8.5)
eGFR: 48 mL/min/{1.73_m2} — ABNORMAL LOW (ref >59)

## 2021-05-24 LAB — LIPID PANEL
Chol/HDL Ratio: 7 ratio — ABNORMAL HIGH (ref 0.0–5.0)
Cholesterol, Total: 175 mg/dL (ref 100–199)
HDL: 25 mg/dL — ABNORMAL LOW (ref >39)
LDL Chol Calc (NIH): 122 mg/dL — ABNORMAL HIGH (ref 0–99)
Triglycerides: 157 mg/dL — ABNORMAL HIGH (ref 0–149)
VLDL Cholesterol Cal: 28 mg/dL (ref 5–40)

## 2021-06-17 ENCOUNTER — Other Ambulatory Visit: Payer: Self-pay | Admitting: Nurse Practitioner

## 2021-06-17 DIAGNOSIS — K219 Gastro-esophageal reflux disease without esophagitis: Secondary | ICD-10-CM

## 2021-07-11 ENCOUNTER — Ambulatory Visit (INDEPENDENT_AMBULATORY_CARE_PROVIDER_SITE_OTHER): Payer: Medicare Other

## 2021-07-11 VITALS — Wt 156.0 lb

## 2021-07-11 DIAGNOSIS — Z0000 Encounter for general adult medical examination without abnormal findings: Secondary | ICD-10-CM | POA: Diagnosis not present

## 2021-07-11 NOTE — Progress Notes (Signed)
Subjective:   Edward [name] is a 71 y.o. male who presents for Medicare Annual/Subsequent preventive examination.  Virtual Visit via Telephone Note  I connected with  Edward [name] on 07/11/21 at 10:30 AM EST by telephone and verified that I am speaking with the correct person using two identifiers.  Location: Patient: Home Provider: [facility] Persons participating in the virtual visit: patient/Nurse Health Advisor   I discussed the limitations, risks, security and privacy concerns of performing an evaluation and management service by telephone and the availability of in person appointments. The patient expressed understanding and agreed to proceed.  Interactive audio and video telecommunications were attempted between this nurse and patient, however failed, due to patient having technical difficulties OR patient did not have access to video capability.  We continued and completed visit with audio only.  Some vital signs may be absent or patient reported.   Oney Tatlock E Velena Keegan, LPN   Review of Systems     Cardiac Risk Factors include: advanced age (>56mn, >>64women);diabetes mellitus;dyslipidemia;hypertension;male gender;sedentary lifestyle;Other (see comment);microalbuminuria, Risk factor comments: CAD, thrombocytopenia     Objective:    Today's Vitals   07/11/21 1023  Weight: 156 lb (70.8 kg)   Body mass index is 22.38 kg/m.  Advanced Directives 07/11/2021 04/21/2021 12/15/2020 08/15/2020 07/07/2020 03/28/2020 12/22/2019  Does Patient Have a Medical Advance Directive? No No No No No No No  Would patient like information on creating a medical advance directive? No - Patient declined No - Patient declined No - Patient declined No - Patient declined No - Patient declined No - Patient declined No - Patient declined    Current Medications (verified) Outpatient Encounter Medications as of 07/11/2021  Medication Sig   ACCU-CHEK AVIVA PLUS test strip USE TO CHECK BLOOD SUGAR ONCE DAILY    aspirin 81 MG tablet Take 81 mg by mouth daily.   atorvastatin (LIPITOR) 40 MG tablet Take 1 tablet by mouth once daily   brimonidine (ALPHAGAN) 0.2 % ophthalmic solution SMARTSIG:In Eye(s)   Cholecalciferol (VITAMIN D3) 2000 UNITS TABS Take 1 tablet by mouth daily.   Copper Gluconate 2 MG CAPS Take by mouth.   Cyanocobalamin (B-12 PO) Take by mouth.   ferrous gluconate (FERGON) 324 MG tablet Take by mouth.   gemfibrozil (LOPID) 600 MG tablet TAKE 1 TABLET BY MOUTH TWICE DAILY BEFORE MEAL(S)   glipiZIDE (GLUCOTROL) 5 MG tablet 2 tablets in morning and 1 tablet at night   LOKELMA 5 g packet SMARTSIG:5 Gram(s) By Mouth Twice a Week   metoprolol tartrate (LOPRESSOR) 25 MG tablet Take 0.5 tablets (12.5 mg total) by mouth in the morning and at bedtime.   nitroGLYCERIN (NITROSTAT) 0.4 MG SL tablet Place 1 tablet (0.4 mg total) under the tongue every 5 (five) minutes x 3 doses as needed for chest pain (if no relief after 2nd dose, proceed to the ED for an evaluation).   Omega-3 Fatty Acids (FISH OIL) 1000 MG CAPS Take by mouth.   pantoprazole (PROTONIX) 40 MG tablet Take 1 tablet by mouth once daily   sodium bicarbonate 650 MG tablet Take by mouth.   traZODone (DESYREL) 100 MG tablet Take 1 tablet (100 mg total) by mouth at bedtime.   VELTASSA 8.4 g packet Take by mouth.   isosorbide mononitrate (IMDUR) 30 MG 24 hr tablet Take 1 tablet (30 mg total) by mouth daily. New 01/30/2018   No facility-administered encounter medications on file as of 07/11/2021.    Allergies (verified) Patient has no  known allergies.   History: Past Medical History:  Diagnosis Date   Cataract    CKD (chronic kidney disease) stage 3, GFR 30-59 ml/min (HCC)    Coronary atherosclerosis of native coronary artery    BMS LAD 2006; residual 75% distal RCA; EF 50%   Essential hypertension    Glaucoma    Low back pain    MI (myocardial infarction) ([place])    Anterior 2006   Mixed hyperlipidemia    Renal insufficiency     Type 2 diabetes mellitus (HCC)    Past Surgical History:  Procedure Laterality Date   CATARACT EXTRACTION, BILATERAL     CORONARY ANGIOPLASTY WITH STENT PLACEMENT  2006   Family History  Problem Relation Age of Onset   Aneurysm Father        Brain aneurysm   Lung cancer Mother        Small cell carcinoma of lung,kidney and heart   Heart disease Mother    Cancer Brother        lungs   Diabetes Sister    Diabetes Sister    Diabetes Brother    Diabetes Brother    Social History   Socioeconomic History   Marital status: Legally Separated    Spouse name: [name]    Number of children: 2   Years of education: 7th   Highest education level: 7th grade  Occupational History   Occupation: Retired  Tobacco Use   Smoking status: Former    Packs/day: 2.00    Years: 20.00    Pack years: 40.00    Types: Cigarettes    Quit date: 05/08/1987    Years since quitting: 34.2   Smokeless tobacco: Never  Vaping Use   Vaping Use: Never used  Substance and Sexual Activity   Alcohol use: No    Alcohol/week: 0.0 standard drinks   Drug use: No   Sexual activity: Not Currently  Other Topics Concern   Not on file  Social History Narrative   Lives alone    Social Determinants of Health   Financial Resource Strain: Low Risk    Difficulty of Paying Living Expenses: Not very hard  Food Insecurity: No Food Insecurity   Worried About Charity fundraiser in the Last Year: Never true   [place] in the Last Year: Never true  Transportation Needs: No Transportation Needs   Lack of Transportation (Medical): No   Lack of Transportation (Non-Medical): No  Physical Activity: Insufficiently Active   Days of Exercise per Week: 7 days   Minutes of Exercise per Session: 20 min  Stress: No Stress Concern Present   Feeling of Stress : Not at all  Social Connections: Socially Isolated   Frequency of Communication with Friends and Family: More than three times a week   Frequency of Social  Gatherings with Friends and Family: More than three times a week   Attends Religious Services: Never   Marine scientist or Organizations: No   Attends Music therapist: Never   Marital Status: Separated    Tobacco Counseling Counseling given: Not Answered   Clinical Intake:  Pre-visit preparation completed: Yes  Pain : No/denies pain     BMI - recorded: 22.38 Nutritional Status: BMI of 19-24  Normal Nutritional Risks: None Diabetes: Yes CBG done?: No Did pt. bring in CBG monitor from home?: No  How often do you need to have someone help you when you read instructions, pamphlets, or other written  materials from your doctor or pharmacy?: 1 - Never  Diabetic? Nutrition Risk Assessment:  Has the patient had any N/V/D within the last 2 months?  No  Does the patient have any non-healing wounds?  No  Has the patient had any unintentional weight loss or weight gain?  No   Diabetes:  Is the patient diabetic?  Yes  If diabetic, was a CBG obtained today?  No  Did the patient bring in their glucometer from home?  No  How often do you monitor your CBG's? Hardly ever.   Financial Strains and Diabetes Management:  Are you having any financial strains with the device, your supplies or your medication? No .  Does the patient want to be seen by Chronic Care Management for management of their diabetes?  No  Would the patient like to be referred to a Nutritionist or for Diabetic Management?  No   Diabetic Exams:  Diabetic Eye Exam: Completed 2020. Overdue for diabetic eye exam. Pt has been advised about the importance in completing this exam. Pt declines referral - says he will make local appt soon  Diabetic Foot Exam: Completed 05/23/2021. Pt has been advised about the importance in completing this exam. Pt is scheduled for diabetic foot exam on next year.    Interpreter Needed?: No  Information entered by :: Marisa Hufstetler, LPN   Activities of Daily Living In  your present state of health, do you have any difficulty performing the following activities: 07/11/2021  Hearing? N  Vision? N  Difficulty concentrating or making decisions? N  Walking or climbing stairs? N  Dressing or bathing? N  Doing errands, shopping? N  Preparing Food and eating ? N  Using the Toilet? N  In the past six months, have you accidently leaked urine? N  Do you have problems with loss of bowel control? N  Managing your Medications? N  Managing your Finances? N  Housekeeping or managing your Housekeeping? N  Some recent data might be hidden    Patient Care Team: [name], FNP as PCP - General (Nurse Practitioner) Satira Sark, MD as PCP - Cardiology (Cardiology) Danice Goltz, MD as Consulting Physician (Ophthalmology) Derek Jack, MD as Consulting Physician (Hematology) Liana Gerold, MD as Consulting Physician (Nephrology) Register, Luetta Nutting, PA-C as Physician Assistant (Dermatology)  Indicate any recent Medical Services you may have received from other than [facility] providers in the past year (date may be approximate).     Assessment:   This is a routine wellness examination for Edward [name].  Hearing/Vision screen Hearing Screening - Comments:: Denies hearing difficulties   Vision Screening - Comments:: Denies vision difficulties - behind on annual eye exams - used to see someone in [place] issues and exercise activities discussed: Current Exercise Habits: Home exercise routine, Type of exercise: walking, Time (Minutes): 20, Frequency (Times/Week): 7, Weekly Exercise (Minutes/Week): 140, Intensity: Mild, Exercise limited by: None identified   Goals Addressed             This Visit's Progress    Prevent falls   On track    Stay active        Depression Screen PHQ 2/9 Scores 07/11/2021 05/23/2021 02/20/2021 08/16/2020 07/07/2020 04/28/2020 01/15/2020  PHQ - 2 Score 0 0 0 0 0 0 0  PHQ- 9 Score 0 0 0 - - - -     Fall Risk Fall Risk  07/11/2021 05/23/2021 02/20/2021 08/16/2020 07/07/2020  Falls in the past year? 0 0 0 0  0  Number falls in past yr: 0 - - - -  Injury with Fall? 0 - - - -  Risk for fall due to : No Fall Risks - - - -  Follow up Falls prevention discussed - - - -    FALL [place]:  Any stairs in or around the home? No  If so, are there any without handrails? No  Home free of loose throw rugs in walkways, pet beds, electrical cords, etc? Yes  Adequate lighting in your home to reduce risk of falls? Yes   ASSISTIVE DEVICES UTILIZED TO PREVENT FALLS:  Life alert? No  Use of a cane, walker or w/c? No  Grab bars in the bathroom? No  Shower chair or bench in shower? No  Elevated toilet seat or a handicapped toilet? No   TIMED UP AND GO:  Was the test performed? No . Telephonic visit  Cognitive Function: Normal cognitive status assessed by direct observation by this Nurse Health Advisor. No abnormalities found.       6CIT Screen 07/07/2020 07/01/2019  What Year? 0 points 0 points  What month? 0 points 0 points  What time? 0 points 0 points  Count back from 20 0 points 0 points  Months in reverse 0 points 4 points  Repeat phrase 0 points 10 points  Total Score 0 14    Immunizations Immunization History  Administered Date(s) Administered   Fluad Quad(high Dose 65+) 02/20/2021   Influenza, High Dose Seasonal PF 02/03/2018, 01/20/2019   Influenza,inj,Quad PF,6+ Mos 03/08/2014, 04/18/2015, 03/05/2016, 01/28/2017   Influenza-Unspecified 01/25/2019, 03/01/2020   Moderna Sars-Covid-2 Vaccination 03/11/2020   PFIZER(Purple Top)SARS-COV-2 Vaccination 07/03/2019, 07/31/2019   Pneumococcal Conjugate-13 08/05/2015   Pneumococcal Polysaccharide-23 10/19/2017   Td 05/07/2004   Tdap 01/13/2015    TDAP status: Up to date  Flu Vaccine status: Up to date  Pneumococcal vaccine status: Up to date  Covid-19 vaccine status: Completed vaccines  Qualifies for  Shingles Vaccine? Yes   Zostavax completed No   Shingrix Completed?: No.    Education has been provided regarding the importance of this vaccine. Patient has been advised to call insurance company to determine out of pocket expense if they have not yet received this vaccine. Advised may also receive vaccine at local pharmacy or Health Dept. Verbalized acceptance and understanding.  Screening Tests Health Maintenance  Topic Date Due   OPHTHALMOLOGY EXAM  03/01/2019   COVID-19 Vaccine (4 - Booster) 05/06/2020   URINE MICROALBUMIN  08/16/2021   Zoster Vaccines- Shingrix (1 of 2) 08/21/2021 (Originally 08/25/1969)   COLONOSCOPY (Pts 45-44yr Insurance coverage will need to be confirmed)  08/17/2021   HEMOGLOBIN A1C  11/20/2021   FOOT EXAM  05/23/2022   TETANUS/TDAP  01/12/2025   Pneumonia Vaccine 71 Years old  Completed   INFLUENZA VACCINE  Completed   Hepatitis C Screening  Completed   HPV VACCINES  Aged Out    Health Maintenance  Health Maintenance Due  Topic Date Due   OPHTHALMOLOGY EXAM  03/01/2019   COVID-19 Vaccine (4 - Booster) 05/06/2020   URINE MICROALBUMIN  08/16/2021    Colorectal cancer screening: Type of screening: Colonoscopy. Completed 08/18/2011. Repeat every 10 years  Lung Cancer Screening: (Low Dose CT Chest recommended if Age 69-80years, 30 pack-year currently smoking OR have quit w/in 15years.) does not qualify.   Additional Screening:  Hepatitis C Screening: does qualify; Completed 10/14/2019  Vision Screening: Recommended annual ophthalmology exams for early detection  of glaucoma and other disorders of the eye. Is the patient up to date with their annual eye exam?  No  Who is the provider or what is the name of the office in which the patient attends annual eye exams? unknown If pt is not established with a provider, would they like to be referred to a provider to establish care? No .   Dental Screening: Recommended annual dental exams for proper oral  hygiene  Community Resource Referral / Chronic Care Management: CRR required this visit?  No   CCM required this visit?  No      Plan:     I have personally reviewed and noted the following in the patients chart:   Medical and social history Use of alcohol, tobacco or illicit drugs  Current medications and supplements including opioid prescriptions. Patient is not currently taking opioid prescriptions. Functional ability and status Nutritional status Physical activity Advanced directives List of other physicians Hospitalizations, surgeries, and ER visits in previous 12 months Vitals Screenings to include cognitive, depression, and falls Referrals and appointments  In addition, I have reviewed and discussed with patient certain preventive protocols, quality metrics, and best practice recommendations. A written personalized care plan for preventive services as well as general preventive health recommendations were provided to patient.     Sandrea Hammond, LPN   05/14/15   Nurse Notes: None

## 2021-07-11 NOTE — Patient Instructions (Signed)
Edward [name] , Thank you for taking time to come for your Medicare Wellness Visit. I appreciate your ongoing commitment to your health goals. Please review the following plan we discussed and let me know if I can assist you in the future.   Screening recommendations/referrals: Colonoscopy:  Done 08/18/2011 - repeat in 10 years Recommended yearly ophthalmology/optometry visit for glaucoma screening and checkup Recommended yearly dental visit for hygiene and checkup  Vaccinations: Influenza vaccine: Done 02/20/2021 - Repeat annually Pneumococcal vaccine: Done 08/05/15 & 10/19/17 - ask about Prevnar-20 Tdap vaccine: Done 01/13/2015 - Repeat in 10 years Shingles vaccine: DUE - Declined   Covid-19: Done 07/03/2019, 07/31/2019, 03/11/20, & ~09/2020  Advanced directives: Advance directive discussed with you today. Even though you declined this today, please call our office should you change your mind, and we can give you the proper paperwork for you to fill out.   Conditions/risks identified: Aim for 30 minutes of exercise or brisk walking, 6-8 glasses of water, and 5 servings of fruits and vegetables each day. See end of the summary for tips on controlling your diabetes.  Next appointment: Follow up in one year for your annual wellness visit.   Preventive Care 33 Years and Older, Male  Preventive care refers to lifestyle choices and visits with your health care provider that can promote health and wellness. What does preventive care include? A yearly physical exam. This is also called an annual well check. Dental exams once or twice a year. Routine eye exams. Ask your health care provider how often you should have your eyes checked. Personal lifestyle choices, including: Daily care of your teeth and gums. Regular physical activity. Eating a healthy diet. Avoiding tobacco and drug use. Limiting alcohol use. Practicing safe sex. Taking low doses of aspirin every day. Taking vitamin and mineral  supplements as recommended by your health care provider. What happens during an annual well check? The services and screenings done by your health care provider during your annual well check will depend on your age, overall health, lifestyle risk factors, and family history of disease. Counseling  Your health care provider may ask you questions about your: Alcohol use. Tobacco use. Drug use. Emotional well-being. Home and relationship well-being. Sexual activity. Eating habits. History of falls. Memory and ability to understand (cognition). Work and work Statistician. Screening  You may have the following tests or measurements: Height, weight, and BMI. Blood pressure. Lipid and cholesterol levels. These may be checked every 5 years, or more frequently if you are over 35 years old. Skin check. Lung cancer screening. You may have this screening every year starting at age 55 if you have a 30-pack-year history of smoking and currently smoke or have quit within the past 15 years. Fecal occult blood test (FOBT) of the stool. You may have this test every year starting at age 49. Flexible sigmoidoscopy or colonoscopy. You may have a sigmoidoscopy every 5 years or a colonoscopy every 10 years starting at age 21. Prostate cancer screening. Recommendations will vary depending on your family history and other risks. Hepatitis C blood test. Hepatitis B blood test. Sexually transmitted disease (STD) testing. Diabetes screening. This is done by checking your blood sugar (glucose) after you have not eaten for a while (fasting). You may have this done every 1-3 years. Abdominal aortic aneurysm (AAA) screening. You may need this if you are a current or former smoker. Osteoporosis. You may be screened starting at age 75 if you are at high risk. Talk with  your health care provider about your test results, treatment options, and if necessary, the need for more tests. Vaccines  Your health care provider  may recommend certain vaccines, such as: Influenza vaccine. This is recommended every year. Tetanus, diphtheria, and acellular pertussis (Tdap, Td) vaccine. You may need a Td booster every 10 years. Zoster vaccine. You may need this after age 47. Pneumococcal 13-valent conjugate (PCV13) vaccine. One dose is recommended after age 66. Pneumococcal polysaccharide (PPSV23) vaccine. One dose is recommended after age 14. Talk to your health care provider about which screenings and vaccines you need and how often you need them. This information is not intended to replace advice given to you by your health care provider. Make sure you discuss any questions you have with your health care provider. Document Released: 05/20/2015 Document Revised: 01/11/2016 Document Reviewed: 02/22/2015 Elsevier Interactive Patient Education  2017 [place] Prevention in the Home Falls can cause injuries. They can happen to people of all ages. There are many things you can do to make your home safe and to help prevent falls. What can I do on the outside of my home? Regularly fix the edges of walkways and driveways and fix any cracks. Remove anything that might make you trip as you walk through a door, such as a raised step or threshold. Trim any bushes or trees on the path to your home. Use bright outdoor lighting. Clear any walking paths of anything that might make someone trip, such as rocks or tools. Regularly check to see if handrails are loose or broken. Make sure that both sides of any steps have handrails. Any raised decks and porches should have guardrails on the edges. Have any leaves, snow, or ice cleared regularly. Use sand or salt on walking paths during winter. Clean up any spills in your garage right away. This includes oil or grease spills. What can I do in the bathroom? Use night lights. Install grab bars by the toilet and in the tub and shower. Do not use towel bars as grab bars. Use  non-skid mats or decals in the tub or shower. If you need to sit down in the shower, use a plastic, non-slip stool. Keep the floor dry. Clean up any water that spills on the floor as soon as it happens. Remove soap buildup in the tub or shower regularly. Attach bath mats securely with double-sided non-slip rug tape. Do not have throw rugs and other things on the floor that can make you trip. What can I do in the bedroom? Use night lights. Make sure that you have a light by your bed that is easy to reach. Do not use any sheets or blankets that are too big for your bed. They should not hang down onto the floor. Have a firm chair that has side arms. You can use this for support while you get dressed. Do not have throw rugs and other things on the floor that can make you trip. What can I do in the kitchen? Clean up any spills right away. Avoid walking on wet floors. Keep items that you use a lot in easy-to-reach places. If you need to reach something above you, use a strong step stool that has a grab bar. Keep electrical cords out of the way. Do not use floor polish or wax that makes floors slippery. If you must use wax, use non-skid floor wax. Do not have throw rugs and other things on the floor that can make you trip.  What can I do with my stairs? Do not leave any items on the stairs. Make sure that there are handrails on both sides of the stairs and use them. Fix handrails that are broken or loose. Make sure that handrails are as long as the stairways. Check any carpeting to make sure that it is firmly attached to the stairs. Fix any carpet that is loose or worn. Avoid having throw rugs at the top or bottom of the stairs. If you do have throw rugs, attach them to the floor with carpet tape. Make sure that you have a light switch at the top of the stairs and the bottom of the stairs. If you do not have them, ask someone to add them for you. What else can I do to help prevent falls? Wear  shoes that: Do not have high heels. Have rubber bottoms. Are comfortable and fit you well. Are closed at the toe. Do not wear sandals. If you use a stepladder: Make sure that it is fully opened. Do not climb a closed stepladder. Make sure that both sides of the stepladder are locked into place. Ask someone to hold it for you, if possible. Clearly mark and make sure that you can see: Any grab bars or handrails. First and last steps. Where the edge of each step is. Use tools that help you move around (mobility aids) if they are needed. These include: Canes. Walkers. Scooters. Crutches. Turn on the lights when you go into a dark area. Replace any light bulbs as soon as they burn out. Set up your furniture so you have a clear path. Avoid moving your furniture around. If any of your floors are uneven, fix them. If there are any pets around you, be aware of where they are. Review your medicines with your doctor. Some medicines can make you feel dizzy. This can increase your chance of falling. Ask your doctor what other things that you can do to help prevent falls. This information is not intended to replace advice given to you by your health care provider. Make sure you discuss any questions you have with your health care provider. Document Released: 02/17/2009 Document Revised: 09/29/2015 Document Reviewed: 05/28/2014 Elsevier Interactive Patient Education  2017 [place].   Diabetes Mellitus and Nutrition, Adult When you have diabetes, or diabetes mellitus, it is very important to have healthy eating habits because your blood sugar (glucose) levels are greatly affected by what you eat and drink. Eating healthy foods in the right amounts, at about the same times every day, can help you: Manage your blood glucose. Lower your risk of heart disease. Improve your blood pressure. Reach or maintain a healthy weight. What can affect my meal plan? Every person with diabetes is different,  and each person has different needs for a meal plan. Your health care provider may recommend that you work with a dietitian to make a meal plan that is best for you. Your meal plan may vary depending on factors such as: The calories you need. The medicines you take. Your weight. Your blood glucose, blood pressure, and cholesterol levels. Your activity level. Other health conditions you have, such as heart or kidney disease. How do carbohydrates affect me? Carbohydrates, also called carbs, affect your blood glucose level more than any other type of food. Eating carbs raises the amount of glucose in your blood. It is important to know how many carbs you can safely have in each meal. This is different for every person. Your  dietitian can help you calculate how many carbs you should have at each meal and for each snack. How does alcohol affect me? Alcohol can cause a decrease in blood glucose (hypoglycemia), especially if you use insulin or take certain diabetes medicines by mouth. Hypoglycemia can be a life-threatening condition. Symptoms of hypoglycemia, such as sleepiness, dizziness, and confusion, are similar to symptoms of having too much alcohol. Do not drink alcohol if: Your health care provider tells you not to drink. You are pregnant, may be pregnant, or are planning to become pregnant. If you drink alcohol: Limit how much you have to: 0-1 drink a day for women. 0-2 drinks a day for men. Know how much alcohol is in your drink. In the [place] one drink equals one 12 oz bottle of beer (355 mL), one 5 oz glass of wine (148 mL), or one 1 oz glass of hard liquor (44 mL). Keep yourself hydrated with water, diet soda, or unsweetened iced tea. Keep in mind that regular soda, juice, and other mixers may contain a lot of sugar and must be counted as carbs. What are tips for following this plan? Reading food labels Start by checking the serving size on the Nutrition Facts label of packaged foods and  drinks. The number of calories and the amount of carbs, fats, and other nutrients listed on the label are based on one serving of the item. Many items contain more than one serving per package. Check the total grams (g) of carbs in one serving. Check the number of grams of saturated fats and trans fats in one serving. Choose foods that have a low amount or none of these fats. Check the number of milligrams (mg) of salt (sodium) in one serving. Most people should limit total sodium intake to less than 2,300 mg per day. Always check the nutrition information of foods labeled as "low-fat" or "nonfat." These foods may be higher in added sugar or refined carbs and should be avoided. Talk to your dietitian to identify your daily goals for nutrients listed on the label. Shopping Avoid buying canned, pre-made, or processed foods. These foods tend to be high in fat, sodium, and added sugar. Shop around the outside edge of the grocery store. This is where you will most often find fresh fruits and vegetables, bulk grains, fresh meats, and fresh dairy products. Cooking Use low-heat cooking methods, such as baking, instead of high-heat cooking methods, such as deep frying. Cook using healthy oils, such as olive, canola, or sunflower oil. Avoid cooking with butter, cream, or high-fat meats. Meal planning Eat meals and snacks regularly, preferably at the same times every day. Avoid going long periods of time without eating. Eat foods that are high in fiber, such as fresh fruits, vegetables, beans, and whole grains. Eat 4-6 oz (112-168 g) of lean protein each day, such as lean meat, chicken, fish, eggs, or tofu. One ounce (oz) (28 g) of lean protein is equal to: 1 oz (28 g) of meat, chicken, or fish. 1 egg.  cup (62 g) of tofu. Eat some foods each day that contain healthy fats, such as avocado, nuts, seeds, and fish. What foods should I eat? Fruits Berries. Apples. Oranges. Peaches. Apricots. Plums. Grapes.  Mangoes. Papayas. Pomegranates. Kiwi. Cherries. Vegetables Leafy greens, including lettuce, spinach, kale, chard, collard greens, mustard greens, and cabbage. Beets. Cauliflower. Broccoli. Carrots. Green beans. Tomatoes. Peppers. Onions. Cucumbers. Brussels sprouts. Grains Whole grains, such as whole-wheat or whole-grain bread, crackers, tortillas, cereal, and pasta. Unsweetened  oatmeal. Quinoa. Brown or wild rice. Meats and other proteins Seafood. Poultry without skin. Lean cuts of poultry and beef. Tofu. Nuts. Seeds. Dairy Low-fat or fat-free dairy products such as milk, yogurt, and cheese. The items listed above may not be a complete list of foods and beverages you can eat and drink. Contact a dietitian for more information. What foods should I avoid? Fruits Fruits canned with syrup. Vegetables Canned vegetables. Frozen vegetables with butter or cream sauce. Grains Refined white flour and flour products such as bread, pasta, snack foods, and cereals. Avoid all processed foods. Meats and other proteins Fatty cuts of meat. Poultry with skin. Breaded or fried meats. Processed meat. Avoid saturated fats. Dairy Full-fat yogurt, cheese, or milk. Beverages Sweetened drinks, such as soda or iced tea. The items listed above may not be a complete list of foods and beverages you should avoid. Contact a dietitian for more information. Questions to ask a health care provider Do I need to meet with a certified diabetes care and education specialist? Do I need to meet with a dietitian? What number can I call if I have questions? When are the best times to check my blood glucose? Where to find more information: American Diabetes Association: diabetes.org Academy of Nutrition and Dietetics: eatright.[employer] of Diabetes and Digestive and Kidney Diseases: [employer] Association of Diabetes Care & Education Specialists: diabeteseducator.org Summary It is important to have healthy  eating habits because your blood sugar (glucose) levels are greatly affected by what you eat and drink. It is important to use alcohol carefully. A healthy meal plan will help you manage your blood glucose and lower your risk of heart disease. Your health care provider may recommend that you work with a dietitian to make a meal plan that is best for you. This information is not intended to replace advice given to you by your health care provider. Make sure you discuss any questions you have with your health care provider. Document Revised: 11/25/2019 Document Reviewed: 11/25/2019 Elsevier Patient Education  [place].

## 2021-08-15 ENCOUNTER — Ambulatory Visit (INDEPENDENT_AMBULATORY_CARE_PROVIDER_SITE_OTHER): Payer: Medicare Other | Admitting: Nurse Practitioner

## 2021-08-15 ENCOUNTER — Encounter: Payer: Self-pay | Admitting: Nurse Practitioner

## 2021-08-15 VITALS — BP 122/65 | HR 64 | Temp 98.2°F | Resp 20 | Ht 70.0 in | Wt 153.0 lb

## 2021-08-15 DIAGNOSIS — F5101 Primary insomnia: Secondary | ICD-10-CM

## 2021-08-15 DIAGNOSIS — K219 Gastro-esophageal reflux disease without esophagitis: Secondary | ICD-10-CM | POA: Diagnosis not present

## 2021-08-15 DIAGNOSIS — E1122 Type 2 diabetes mellitus with diabetic chronic kidney disease: Secondary | ICD-10-CM | POA: Diagnosis not present

## 2021-08-15 DIAGNOSIS — Z125 Encounter for screening for malignant neoplasm of prostate: Secondary | ICD-10-CM

## 2021-08-15 DIAGNOSIS — I25119 Atherosclerotic heart disease of native coronary artery with unspecified angina pectoris: Secondary | ICD-10-CM | POA: Diagnosis not present

## 2021-08-15 DIAGNOSIS — N1832 Chronic kidney disease, stage 3b: Secondary | ICD-10-CM

## 2021-08-15 DIAGNOSIS — I10 Essential (primary) hypertension: Secondary | ICD-10-CM | POA: Diagnosis not present

## 2021-08-15 DIAGNOSIS — E782 Mixed hyperlipidemia: Secondary | ICD-10-CM

## 2021-08-15 LAB — BAYER DCA HB A1C WAIVED: HB A1C (BAYER DCA - WAIVED): 7.3 % — ABNORMAL HIGH (ref 4.8–5.6)

## 2021-08-15 MED ORDER — GEMFIBROZIL 600 MG PO TABS: ORAL_TABLET | ORAL | 1 refills | Status: DC

## 2021-08-15 MED ORDER — ISOSORBIDE MONONITRATE ER 30 MG PO TB24: 30.0000 mg | ORAL_TABLET | Freq: Every day | ORAL | 1 refills | Status: DC

## 2021-08-15 MED ORDER — TRAZODONE HCL 100 MG PO TABS: 100.0000 mg | ORAL_TABLET | Freq: Every day | ORAL | 0 refills | Status: DC

## 2021-08-15 MED ORDER — METOPROLOL TARTRATE 25 MG PO TABS: 12.5000 mg | ORAL_TABLET | Freq: Two times a day (BID) | ORAL | 11 refills | Status: DC

## 2021-08-15 MED ORDER — GLIPIZIDE 5 MG PO TABS: ORAL_TABLET | ORAL | 1 refills | Status: DC

## 2021-08-15 MED ORDER — PANTOPRAZOLE SODIUM 40 MG PO TBEC: 40.0000 mg | DELAYED_RELEASE_TABLET | Freq: Every day | ORAL | 1 refills | Status: DC

## 2021-08-15 MED ORDER — ATORVASTATIN CALCIUM 40 MG PO TABS: 40.0000 mg | ORAL_TABLET | Freq: Every day | ORAL | 1 refills | Status: DC

## 2021-08-15 NOTE — Progress Notes (Addendum)
? ?Subjective:  ? ? Patient ID: Edward [name], male    DOB: [birth year], 71 y.o.   MRN: [id number] ? ? ?Chief Complaint: medical management of chronic issues  ?  ? ?HPI: ? ?Edward [name] is a 71 y.o. who identifies as a male who was assigned male at birth.  ? ?Social history: ?Lives with: by himself ?Work history: retired ? ? ?Comes in today for follow up of the following chronic medical issues: ? ?1. Essential hypertension ?No c/o chest pain, sob or headache. Does not check blood pressure at home. ?BP Readings from Last 3 Encounters:  ?05/23/21 139/66  ?04/21/21 125/74  ?04/12/21 (!) 94/50  ? ? ? ?2. Atherosclerosis of native coronary artery of native heart with angina pectoris ([place]) ?Last saw cardiology on 04/12/22. Review of office note- no change to plan of care and he is to follow up in 6 months. ? ?3. Mixed hyperlipidemia ?Does try to watch diet. Does no exercise. ?Lab Results  ?Component Value Date  ? CHOL 175 05/23/2021  ? HDL 25 (L) 05/23/2021  ? LDLCALC 122 (H) 05/23/2021  ? TRIG 157 (H) 05/23/2021  ? CHOLHDL 7.0 (H) 05/23/2021  ? ?The 10-year ASCVD risk score (Arnett DK, et al., 2019) is: 37.7% ? ? ?4. Type 2 diabetes mellitus with stage 3b chronic kidney disease, without long-term current use of insulin ([place]) ?Patient does not check blood sugars at home. Denies any symptoms of low blood sugar. ?Lab Results  ?Component Value Date  ? HGBA1C 7.4 (H) 05/23/2021  ? ? ? ?5. Gastroesophageal reflux disease without esophagitis ?Is on protonix . That seems to work well for his symptoms. ? ?6. Stage 3b chronic kidney disease ([place]) ?No issues voiding ?Lab Results  ?Component Value Date  ? CREATININE 1.56 (H) 05/23/2021  ? ? ? ?7. Primary insomnia ?Is on trazadone to sleep at night. Sleeps about 8-9 hours a night ? ? ?New complaints: ?None today ? ?No Known Allergies ?Outpatient Encounter Medications as of 08/15/2021  ?Medication Sig  ? ACCU-CHEK AVIVA PLUS test strip USE TO CHECK BLOOD SUGAR ONCE DAILY  ? aspirin 81  MG tablet Take 81 mg by mouth daily.  ? atorvastatin (LIPITOR) 40 MG tablet Take 1 tablet by mouth once daily  ? brimonidine (ALPHAGAN) 0.2 % ophthalmic solution SMARTSIG:In Eye(s)  ? Cholecalciferol (VITAMIN D3) 2000 UNITS TABS Take 1 tablet by mouth daily.  ? Copper Gluconate 2 MG CAPS Take by mouth.  ? Cyanocobalamin (B-12 PO) Take by mouth.  ? ferrous gluconate (FERGON) 324 MG tablet Take by mouth.  ? gemfibrozil (LOPID) 600 MG tablet TAKE 1 TABLET BY MOUTH TWICE DAILY BEFORE MEAL(S)  ? glipiZIDE (GLUCOTROL) 5 MG tablet 2 tablets in morning and 1 tablet at night  ? isosorbide mononitrate (IMDUR) 30 MG 24 hr tablet Take 1 tablet (30 mg total) by mouth daily. New 01/30/2018  ? LOKELMA 5 g packet SMARTSIG:5 Gram(s) By Mouth Twice a Week  ? metoprolol tartrate (LOPRESSOR) 25 MG tablet Take 0.5 tablets (12.5 mg total) by mouth in the morning and at bedtime.  ? nitroGLYCERIN (NITROSTAT) 0.4 MG SL tablet Place 1 tablet (0.4 mg total) under the tongue every 5 (five) minutes x 3 doses as needed for chest pain (if no relief after 2nd dose, proceed to the ED for an evaluation).  ? Omega-3 Fatty Acids (FISH OIL) 1000 MG CAPS Take by mouth.  ? pantoprazole (PROTONIX) 40 MG tablet Take 1 tablet by mouth once daily  ?  traZODone (DESYREL) 100 MG tablet Take 1 tablet (100 mg total) by mouth at bedtime.  ? VELTASSA 8.4 g packet Take by mouth.  ? ?No facility-administered encounter medications on file as of 08/15/2021.  ? ? ?Past Surgical History:  ?Procedure Laterality Date  ? CATARACT EXTRACTION, BILATERAL    ? CORONARY ANGIOPLASTY WITH STENT PLACEMENT  2006  ? ? ?Family History  ?Problem Relation Age of Onset  ? Aneurysm Father   ?     Brain aneurysm  ? Lung cancer Mother   ?     Small cell carcinoma of lung,kidney and heart  ? Heart disease Mother   ? Cancer Brother   ?     lungs  ? Diabetes Sister   ? Diabetes Sister   ? Diabetes Brother   ? Diabetes Brother   ? ? ? ? ?Controlled substance contract: n/a ? ? ? ? ?Review of  Systems  ?Constitutional:  Negative for diaphoresis.  ?Eyes:  Negative for pain.  ?Respiratory:  Negative for shortness of breath.   ?Cardiovascular:  Negative for chest pain, palpitations and leg swelling.  ?Gastrointestinal:  Negative for abdominal pain.  ?Endocrine: Negative for polydipsia.  ?Skin:  Negative for rash.  ?Neurological:  Negative for dizziness, weakness and headaches.  ?Hematological:  Does not bruise/bleed easily.  ?All other systems reviewed and are negative. ? ?   ?Objective:  ? Physical Exam ?Vitals and nursing note reviewed.  ?Constitutional:   ?   Appearance: Normal appearance. He is well-developed.  ?HENT:  ?   Head: Normocephalic.  ?   Nose: Nose normal.  ?   Mouth/Throat:  ?   Mouth: Mucous membranes are moist.  ?   Pharynx: Oropharynx is clear.  ?Eyes:  ?   Pupils: Pupils are equal, round, and reactive to light.  ?Neck:  ?   Thyroid: No thyroid mass or thyromegaly.  ?   Vascular: No carotid bruit or JVD.  ?   Trachea: Phonation normal.  ?Cardiovascular:  ?   Rate and Rhythm: Normal rate and regular rhythm.  ?   Heart sounds: Murmur (2/6) heard.  ?Pulmonary:  ?   Effort: Pulmonary effort is normal. No respiratory distress.  ?   Breath sounds: Normal breath sounds.  ?Abdominal:  ?   General: Bowel sounds are normal.  ?   Palpations: Abdomen is soft.  ?   Tenderness: There is no abdominal tenderness.  ?Musculoskeletal:     ?   General: Normal range of motion.  ?   Cervical back: Normal range of motion and neck supple.  ?Lymphadenopathy:  ?   Cervical: No cervical adenopathy.  ?Skin: ?   General: Skin is warm and dry.  ?Neurological:  ?   Mental Status: He is alert and oriented to person, place, and time.  ?Psychiatric:     ?   Behavior: Behavior normal.     ?   Thought Content: Thought content normal.     ?   Judgment: Judgment normal.  ? ? ?BP 122/65   Pulse 64   Temp 98.2 ?F (36.8 ?C) (Temporal)   Resp 20   Ht 5' 10"  (1.778 m)   Wt 153 lb (69.4 kg)   SpO2 100%   BMI 21.95 kg/m?   ? ? ?HGBA1c 7.3% ?   ?Assessment & Plan:  ?Edward [name] comes in today with chief complaint of Medical Management of Chronic Issues ? ? ?Diagnosis and orders addressed: ? ?1. Essential hypertension ?Low sodium  diet ?- CBC with Differential/Platelet ?- CMP14+EGFR ? ?2. Atherosclerosis of native coronary artery of native heart with angina pectoris ([place]) ?Keep follow up with cardiology ?- isosorbide mononitrate (IMDUR) 30 MG 24 hr tablet; Take 1 tablet (30 mg total) by mouth daily. New 01/30/2018  Dispense: 90 tablet; Refill: 1 ?- metoprolol tartrate (LOPRESSOR) 25 MG tablet; Take 0.5 tablets (12.5 mg total) by mouth in the morning and at bedtime.  Dispense: 30 tablet; Refill: 11 ? ?3. Mixed hyperlipidemia ?Low fat diet ?- Lipid panel ?- atorvastatin (LIPITOR) 40 MG tablet; Take 1 tablet (40 mg total) by mouth daily.  Dispense: 90 tablet; Refill: 1 ?- gemfibrozil (LOPID) 600 MG tablet; TAKE 1 TABLET BY MOUTH TWICE DAILY BEFORE MEAL(S)  Dispense: 180 tablet; Refill: 1 ? ?4. Type 2 diabetes mellitus with stage 3b chronic kidney disease, without long-term current use of insulin ([place]) ?Continue to watch carbs in diet ?- Bayer DCA Hb A1c Waived ?- Microalbumin / creatinine urine ratio ?- Bayer DCA Hb A1c Waived ?- glipiZIDE (GLUCOTROL) 5 MG tablet; 2 tablets in morning and 1 tablet at night  Dispense: 270 tablet; Refill: 1 ? ?5. Gastroesophageal reflux disease without esophagitis ?Avoid spicy foods ?Do not eat 2 hours prior to bedtime ?- pantoprazole (PROTONIX) 40 MG tablet; Take 1 tablet (40 mg total) by mouth daily.  Dispense: 90 tablet; Refill: 1 ? ?6. Stage 3b chronic kidney disease ([place]) ?Labs pending ? ?7. Primary insomnia ?Bedtime routine ?- traZODone (DESYREL) 100 MG tablet; Take 1 tablet (100 mg total) by mouth at bedtime.  Dispense: 90 tablet; Refill: 0 ? ?8. Screening for prostate cancer ?Labs pending ? ? ?Labs pending ?Health Maintenance reviewed ?Diet and exercise encouraged ? ?Follow up plan: ?3  months ? ? ?Mary-Margaret Hassell Done, FNP ? ? ?

## 2021-08-15 NOTE — Patient Instructions (Signed)

## 2021-08-16 LAB — LIPID PANEL
Chol/HDL Ratio: 7 ratio — ABNORMAL HIGH (ref 0.0–5.0)
Cholesterol, Total: 174 mg/dL (ref 100–199)
HDL: 25 mg/dL — ABNORMAL LOW (ref >39)
LDL Chol Calc (NIH): 118 mg/dL — ABNORMAL HIGH (ref 0–99)
Triglycerides: 171 mg/dL — ABNORMAL HIGH (ref 0–149)
VLDL Cholesterol Cal: 31 mg/dL (ref 5–40)

## 2021-08-16 LAB — CMP14+EGFR
ALT: 18 IU/L (ref 0–44)
AST: 37 IU/L (ref 0–40)
Albumin/Globulin Ratio: 1.5 (ref 1.2–2.2)
Albumin: 3.8 g/dL (ref 3.8–4.8)
Alkaline Phosphatase: 58 IU/L (ref 44–121)
BUN/Creatinine Ratio: 9 — ABNORMAL LOW (ref 10–24)
BUN: 14 mg/dL (ref 8–27)
Bilirubin Total: 1.8 mg/dL — ABNORMAL HIGH (ref 0.0–1.2)
CO2: 22 mmol/L (ref 20–29)
Calcium: 9.6 mg/dL (ref 8.6–10.2)
Chloride: 105 mmol/L (ref 96–106)
Creatinine, Ser: 1.62 mg/dL — ABNORMAL HIGH (ref 0.76–1.27)
Globulin, Total: 2.6 g/dL (ref 1.5–4.5)
Glucose: 82 mg/dL (ref 70–99)
Potassium: 3.6 mmol/L (ref 3.5–5.2)
Sodium: 146 mmol/L — ABNORMAL HIGH (ref 134–144)
Total Protein: 6.4 g/dL (ref 6.0–8.5)
eGFR: 46 mL/min/{1.73_m2} — ABNORMAL LOW (ref >59)

## 2021-08-16 LAB — CBC WITH DIFFERENTIAL/PLATELET
Basophils Absolute: 0 10*3/uL (ref 0.0–0.2)
Basos: 1 %
EOS (ABSOLUTE): 0.1 10*3/uL (ref 0.0–0.4)
Eos: 2 %
Hematocrit: 36.7 % — ABNORMAL LOW (ref 37.5–51.0)
Hemoglobin: 12.5 g/dL — ABNORMAL LOW (ref 13.0–17.7)
Immature Grans (Abs): 0 10*3/uL (ref 0.0–0.1)
Immature Granulocytes: 0 %
Lymphocytes Absolute: 1.4 10*3/uL (ref 0.7–3.1)
Lymphs: 38 %
MCH: 31.3 pg (ref 26.6–33.0)
MCHC: 34.1 g/dL (ref 31.5–35.7)
MCV: 92 fL (ref 79–97)
Monocytes Absolute: 0.3 10*3/uL (ref 0.1–0.9)
Monocytes: 8 %
Neutrophils Absolute: 1.8 10*3/uL (ref 1.4–7.0)
Neutrophils: 51 %
Platelets: 64 10*3/uL — CL (ref 150–450)
RBC: 3.99 x10E6/uL — ABNORMAL LOW (ref 4.14–5.80)
RDW: 14.9 % (ref 11.6–15.4)
WBC: 3.5 10*3/uL (ref 3.4–10.8)

## 2021-08-18 LAB — MICROALBUMIN / CREATININE URINE RATIO
Creatinine, Urine: 217.6 mg/dL
Microalb/Creat Ratio: 7 mg/g creat (ref 0–29)
Microalbumin, Urine: 15.7 ug/mL

## 2021-08-21 ENCOUNTER — Ambulatory Visit: Payer: Medicare Other | Admitting: Nurse Practitioner

## 2021-08-23 ENCOUNTER — Ambulatory Visit: Payer: Medicare Other | Admitting: Nurse Practitioner

## 2021-08-25 ENCOUNTER — Inpatient Hospital Stay (HOSPITAL_COMMUNITY): Payer: Medicare Other | Attending: Hematology

## 2021-08-25 DIAGNOSIS — D649 Anemia, unspecified: Secondary | ICD-10-CM | POA: Diagnosis not present

## 2021-08-25 DIAGNOSIS — D696 Thrombocytopenia, unspecified: Secondary | ICD-10-CM | POA: Diagnosis present

## 2021-08-25 DIAGNOSIS — E610 Copper deficiency: Secondary | ICD-10-CM

## 2021-08-25 DIAGNOSIS — D539 Nutritional anemia, unspecified: Secondary | ICD-10-CM

## 2021-08-25 DIAGNOSIS — D509 Iron deficiency anemia, unspecified: Secondary | ICD-10-CM

## 2021-08-25 LAB — CBC WITH DIFFERENTIAL/PLATELET
Abs Immature Granulocytes: 0.03 10*3/uL (ref 0.00–0.07)
Basophils Absolute: 0 10*3/uL (ref 0.0–0.1)
Basophils Relative: 1 %
Eosinophils Absolute: 0.1 10*3/uL (ref 0.0–0.5)
Eosinophils Relative: 3 %
HCT: 38.7 % — ABNORMAL LOW (ref 39.0–52.0)
Hemoglobin: 12.5 g/dL — ABNORMAL LOW (ref 13.0–17.0)
Immature Granulocytes: 1 %
Lymphocytes Relative: 36 %
Lymphs Abs: 1.4 10*3/uL (ref 0.7–4.0)
MCH: 30.6 pg (ref 26.0–34.0)
MCHC: 32.3 g/dL (ref 30.0–36.0)
MCV: 94.9 fL (ref 80.0–100.0)
Monocytes Absolute: 0.3 10*3/uL (ref 0.1–1.0)
Monocytes Relative: 8 %
Neutro Abs: 2 10*3/uL (ref 1.7–7.7)
Neutrophils Relative %: 51 %
Platelets: 66 10*3/uL — ABNORMAL LOW (ref 150–400)
RBC: 4.08 MIL/uL — ABNORMAL LOW (ref 4.22–5.81)
RDW: 15.4 % (ref 11.5–15.5)
WBC Morphology: REACTIVE
WBC: 3.8 10*3/uL — ABNORMAL LOW (ref 4.0–10.5)
nRBC: 0 % (ref 0.0–0.2)

## 2021-08-25 LAB — COMPREHENSIVE METABOLIC PANEL
ALT: 19 U/L (ref 0–44)
AST: 37 U/L (ref 15–41)
Albumin: 3.5 g/dL (ref 3.5–5.0)
Alkaline Phosphatase: 52 U/L (ref 38–126)
Anion gap: 10 (ref 5–15)
BUN: 15 mg/dL (ref 8–23)
CO2: 25 mmol/L (ref 22–32)
Calcium: 9.5 mg/dL (ref 8.9–10.3)
Chloride: 104 mmol/L (ref 98–111)
Creatinine, Ser: 1.57 mg/dL — ABNORMAL HIGH (ref 0.61–1.24)
GFR, Estimated: 47 mL/min — ABNORMAL LOW (ref >60)
Glucose, Bld: 121 mg/dL — ABNORMAL HIGH (ref 70–99)
Potassium: 4.2 mmol/L (ref 3.5–5.1)
Sodium: 139 mmol/L (ref 135–145)
Total Bilirubin: 2.5 mg/dL — ABNORMAL HIGH (ref 0.3–1.2)
Total Protein: 7.1 g/dL (ref 6.5–8.1)

## 2021-08-25 LAB — FERRITIN: Ferritin: 45 ng/mL (ref 24–336)

## 2021-08-25 LAB — IRON AND TIBC
Iron: 89 ug/dL (ref 45–182)
Saturation Ratios: 27 % (ref 17.9–39.5)
TIBC: 331 ug/dL (ref 250–450)
UIBC: 242 ug/dL

## 2021-08-25 LAB — LACTATE DEHYDROGENASE: LDH: 178 U/L (ref 98–192)

## 2021-08-26 LAB — ZINC: Zinc: 75 ug/dL (ref 44–115)

## 2021-08-26 LAB — COPPER, SERUM: Copper: 60 ug/dL — ABNORMAL LOW (ref 69–132)

## 2021-08-26 LAB — CERULOPLASMIN: Ceruloplasmin: 12.2 mg/dL — ABNORMAL LOW (ref 16.0–31.0)

## 2021-09-01 ENCOUNTER — Ambulatory Visit (HOSPITAL_COMMUNITY): Payer: Medicare Other | Admitting: Physician Assistant

## 2021-09-03 NOTE — Progress Notes (Signed)
? ?[place] ?[address] ?[place], [place] [address] ? ? ?CLINIC:  ?Medical Oncology/Hematology ? ?PCP:  ?[name], FNP ?[place] ?[place] [address] ?[phone] ? ? ?REASON FOR VISIT:  ?Follow-up for thrombocytopenia and macrocytic anemia ?  ?PRIOR THERAPY: None ?  ?CURRENT THERAPY: Copper tablets, observation ?  ?INTERVAL HISTORY:  ?[name] 71 y.o. male returns for routine follow-up of his low platelets and macrocytosis.  He was last seen by Tarri Abernethy PA-C on 04/21/2021. ?  ?At today's visit, he reports feeling like his usual self.   ?No other recent hospitalizations, surgeries, or changes in baseline health status.  ?He is taking copper supplement twice daily.    ?He takes iron supplement once daily.  ? ?He does note new symptoms of intermittent myoclonic jerking in his bilateral hands for the past several months.  He denies any other neurologic changes. ?  ?He continues to have chronic fatigue with energy about 25%, somewhat worse than before. ?He denies any B symptoms such as fever, chills, night sweats, unintentional weight loss. ?He reports that he has a good appetite and has not experienced any early satiety, nausea, or abdominal pain.   He denies any abnormal bleeding such as hematemesis, hematochezia, melena, or epistaxis.  He admits to easy bruising, but denies any petechial rash. ? ?He has 25% energy and 75% appetite. He endorses that he is maintaining a stable weight. ? ? ?REVIEW OF SYSTEMS:  ?Review of Systems  ?Constitutional:  Positive for fatigue. Negative for appetite change, chills, diaphoresis, fever and unexpected weight change.  ?HENT:   Negative for lump/mass and nosebleeds.   ?Eyes:  Negative for eye problems.  ?Respiratory:  Negative for cough, hemoptysis and shortness of breath.   ?Cardiovascular:  Negative for chest pain, leg swelling and palpitations.  ?Gastrointestinal:  Negative for abdominal pain, blood in stool, constipation,  diarrhea, nausea and vomiting.  ?Genitourinary:  Negative for hematuria.   ?Skin: Negative.   ?Neurological:  Negative for dizziness, headaches and light-headedness.  ?     Myoclonic jerking of hands  ?Hematological:  Does not bruise/bleed easily.   ? ? ?PAST MEDICAL/SURGICAL HISTORY:  ?Past Medical History:  ?Diagnosis Date  ? Cataract   ? CKD (chronic kidney disease) stage 3, GFR 30-59 ml/min (HCC)   ? Coronary atherosclerosis of native coronary artery   ? BMS LAD 2006; residual 75% distal RCA; EF 50%  ? Essential hypertension   ? Glaucoma   ? Low back pain   ? MI (myocardial infarction) ([facility])   ? Anterior 2006  ? Mixed hyperlipidemia   ? Renal insufficiency   ? Type 2 diabetes mellitus ([place])   ? ?Past Surgical History:  ?Procedure Laterality Date  ? CATARACT EXTRACTION, BILATERAL    ? CORONARY ANGIOPLASTY WITH STENT PLACEMENT  2006  ? ? ? ?SOCIAL HISTORY:  ?Social History  ? ?Socioeconomic History  ? Marital status: Legally Separated  ?  Spouse name: Jeannetta Nap   ? Number of children: 2  ? Years of education: 7th  ? Highest education level: 7th grade  ?Occupational History  ? Occupation: Retired  ?Tobacco Use  ? Smoking status: Former  ?  Packs/day: 2.00  ?  Years: 20.00  ?  Pack years: 40.00  ?  Types: Cigarettes  ?  Quit date: 05/08/1987  ?  Years since quitting: 34.3  ? Smokeless tobacco: Never  ?Vaping Use  ? Vaping Use: Never used  ?Substance and Sexual Activity  ?  Alcohol use: No  ?  Alcohol/week: 0.0 standard drinks  ? Drug use: No  ? Sexual activity: Not Currently  ?Other Topics Concern  ? Not on file  ?Social History Narrative  ? Lives alone   ? ?Social Determinants of Health  ? ?Financial Resource Strain: Low Risk   ? Difficulty of Paying Living Expenses: Not very hard  ?Food Insecurity: No Food Insecurity  ? Worried About Charity fundraiser in the Last Year: Never true  ? Ran Out of Food in the Last Year: Never true  ?Transportation Needs: No Transportation Needs  ? Lack of Transportation (Medical): No  ?  Lack of Transportation (Non-Medical): No  ?Physical Activity: Insufficiently Active  ? Days of Exercise per Week: 7 days  ? Minutes of Exercise per Session: 20 min  ?Stress: No Stress Concern Present  ? Feeling of Stress : Not at all  ?Social Connections: Socially Isolated  ? Frequency of Communication with Friends and Family: More than three times a week  ? Frequency of Social Gatherings with Friends and Family: More than three times a week  ? Attends Religious Services: Never  ? Active Member of Clubs or Organizations: No  ? Attends Archivist Meetings: Never  ? Marital Status: Separated  ?Intimate Partner Violence: Not At Risk  ? Fear of Current or Ex-Partner: No  ? Emotionally Abused: No  ? Physically Abused: No  ? Sexually Abused: No  ? ? ?FAMILY HISTORY:  ?Family History  ?Problem Relation Age of Onset  ? Aneurysm Father   ?     Brain aneurysm  ? Lung cancer Mother   ?     Small cell carcinoma of lung,kidney and heart  ? Heart disease Mother   ? Cancer Brother   ?     lungs  ? Diabetes Sister   ? Diabetes Sister   ? Diabetes Brother   ? Diabetes Brother   ? ? ?CURRENT MEDICATIONS:  ?Outpatient Encounter Medications as of 09/05/2021  ?Medication Sig  ? ACCU-CHEK AVIVA PLUS test strip USE TO CHECK BLOOD SUGAR ONCE DAILY  ? aspirin 81 MG tablet Take 81 mg by mouth daily.  ? atorvastatin (LIPITOR) 40 MG tablet Take 1 tablet (40 mg total) by mouth daily.  ? brimonidine (ALPHAGAN) 0.2 % ophthalmic solution SMARTSIG:In Eye(s)  ? Cholecalciferol (VITAMIN D3) 2000 UNITS TABS Take 1 tablet by mouth daily.  ? Copper Gluconate 2 MG CAPS Take by mouth.  ? Cyanocobalamin (B-12 PO) Take by mouth.  ? ferrous gluconate (FERGON) 324 MG tablet Take by mouth.  ? gemfibrozil (LOPID) 600 MG tablet TAKE 1 TABLET BY MOUTH TWICE DAILY BEFORE MEAL(S)  ? glipiZIDE (GLUCOTROL) 5 MG tablet 2 tablets in morning and 1 tablet at night  ? isosorbide mononitrate (IMDUR) 30 MG 24 hr tablet Take 1 tablet (30 mg total) by mouth daily. New  01/30/2018  ? LOKELMA 5 g packet SMARTSIG:5 Gram(s) By Mouth Twice a Week  ? metoprolol tartrate (LOPRESSOR) 25 MG tablet Take 0.5 tablets (12.5 mg total) by mouth in the morning and at bedtime.  ? nitroGLYCERIN (NITROSTAT) 0.4 MG SL tablet Place 1 tablet (0.4 mg total) under the tongue every 5 (five) minutes x 3 doses as needed for chest pain (if no relief after 2nd dose, proceed to the ED for an evaluation).  ? Omega-3 Fatty Acids (FISH OIL) 1000 MG CAPS Take by mouth.  ? pantoprazole (PROTONIX) 40 MG tablet Take 1 tablet (40 mg total) by  mouth daily.  ? traZODone (DESYREL) 100 MG tablet Take 1 tablet (100 mg total) by mouth at bedtime.  ? VELTASSA 8.4 g packet Take by mouth.  ? ?No facility-administered encounter medications on file as of 09/05/2021.  ? ? ?ALLERGIES:  ?No Known Allergies ? ? ?PHYSICAL EXAM:  ?ECOG PERFORMANCE STATUS: 1 - Symptomatic but completely ambulatory ? ?There were no vitals filed for this visit. ?There were no vitals filed for this visit. ?Physical Exam ?Constitutional:   ?   Appearance: Normal appearance.  ?HENT:  ?   Head: Normocephalic and atraumatic.  ?   Mouth/Throat:  ?   Mouth: Mucous membranes are moist.  ?Eyes:  ?   Extraocular Movements: Extraocular movements intact.  ?   Pupils: Pupils are equal, round, and reactive to light.  ?Cardiovascular:  ?   Rate and Rhythm: Normal rate and regular rhythm.  ?   Pulses: Normal pulses.  ?   Heart sounds: Normal heart sounds.  ?Pulmonary:  ?   Effort: Pulmonary effort is normal.  ?   Breath sounds: Normal breath sounds.  ?Abdominal:  ?   General: Bowel sounds are normal.  ?   Palpations: Abdomen is soft.  ?   Tenderness: There is no abdominal tenderness.  ?   Comments: Edge of liver palpated below rib margins.  No splenomegaly appreciated on exam.  ?Musculoskeletal:     ?   General: No swelling.  ?   Right lower leg: No edema.  ?   Left lower leg: No edema.  ?Lymphadenopathy:  ?   Cervical: No cervical adenopathy.  ?Skin: ?   General: Skin is  warm and dry.  ?Neurological:  ?   General: No focal deficit present.  ?   Mental Status: He is alert and oriented to person, place, and time.  ?Psychiatric:     ?   Mood and Affect: Mood normal.     ?

## 2021-09-05 ENCOUNTER — Inpatient Hospital Stay (HOSPITAL_COMMUNITY): Payer: 59 | Attending: Physician Assistant | Admitting: Physician Assistant

## 2021-09-05 VITALS — BP 121/71 | HR 57 | Temp 97.6°F | Resp 18 | Wt 149.5 lb

## 2021-09-05 DIAGNOSIS — R7401 Elevation of levels of liver transaminase levels: Secondary | ICD-10-CM | POA: Diagnosis not present

## 2021-09-05 DIAGNOSIS — N1832 Chronic kidney disease, stage 3b: Secondary | ICD-10-CM | POA: Insufficient documentation

## 2021-09-05 DIAGNOSIS — E1136 Type 2 diabetes mellitus with diabetic cataract: Secondary | ICD-10-CM | POA: Insufficient documentation

## 2021-09-05 DIAGNOSIS — I129 Hypertensive chronic kidney disease with stage 1 through stage 4 chronic kidney disease, or unspecified chronic kidney disease: Secondary | ICD-10-CM | POA: Insufficient documentation

## 2021-09-05 DIAGNOSIS — Z87891 Personal history of nicotine dependence: Secondary | ICD-10-CM | POA: Diagnosis not present

## 2021-09-05 DIAGNOSIS — D539 Nutritional anemia, unspecified: Secondary | ICD-10-CM | POA: Diagnosis not present

## 2021-09-05 DIAGNOSIS — E1122 Type 2 diabetes mellitus with diabetic chronic kidney disease: Secondary | ICD-10-CM | POA: Diagnosis not present

## 2021-09-05 DIAGNOSIS — D696 Thrombocytopenia, unspecified: Secondary | ICD-10-CM | POA: Diagnosis not present

## 2021-09-05 DIAGNOSIS — G253 Myoclonus: Secondary | ICD-10-CM | POA: Insufficient documentation

## 2021-09-05 DIAGNOSIS — E610 Copper deficiency: Secondary | ICD-10-CM | POA: Insufficient documentation

## 2021-09-05 DIAGNOSIS — Z801 Family history of malignant neoplasm of trachea, bronchus and lung: Secondary | ICD-10-CM | POA: Diagnosis not present

## 2021-09-05 DIAGNOSIS — N183 Chronic kidney disease, stage 3 unspecified: Secondary | ICD-10-CM | POA: Diagnosis not present

## 2021-09-05 DIAGNOSIS — Z8051 Family history of malignant neoplasm of kidney: Secondary | ICD-10-CM | POA: Diagnosis not present

## 2021-09-05 DIAGNOSIS — D509 Iron deficiency anemia, unspecified: Secondary | ICD-10-CM

## 2021-09-05 MED ORDER — COPPER 5 MG PO TABS: 5.0000 mg | ORAL_TABLET | Freq: Two times a day (BID) | ORAL | 3 refills | Status: DC

## 2021-09-05 NOTE — Patient Instructions (Signed)
[place] at [facility] ?Discharge Instructions ? ?You were seen today by Tarri Abernethy PA-C for your follow-up visit.   ? ?LOW PLATELETS: Your platelets remain low, but are stable within their baseline range.  This may be due to immune system dysfunction, your low copper levels, or possible bone marrow dysfunction.  You do not need treatment at this time.  We will recheck your platelet levels in 4 months. ? ?LOW IRON: Make sure that you continue to take your iron pill once daily.  Take this at a DIFFERENT time than your other medications, since you are on medications that can interfere with your body's ability to absorb iron.  (For example, if you take your other medications in the morning, take your iron pill at suppertime in the evening.) ? ?LOW COPPER: Your copper level is still low.  I have sent a new prescription to the pharmacy for increased dose of copper supplement.  This is also available over-the-counter.  You should be taking copper 5 mg twice daily.  I would also like to check a 24-hour urine study to see if you have too much copper in your urine. ? ?FOLLOW-UP APPOINTMENT: Labs in 4 months with office visit 1 week after your labs ? ? ?Thank you for choosing [place] at [facility] to provide your oncology and hematology care.  To afford each patient quality time with our provider, please arrive at least 15 minutes before your scheduled appointment time.  ? ?If you have a lab appointment with the [facility] please come in thru the Main Entrance and check in at the main information desk. ? ?You need to re-schedule your appointment should you arrive 10 or more minutes late.  We strive to give you quality time with our providers, and arriving late affects you and other patients whose appointments are after yours.  Also, if you no show three or more times for appointments you may be dismissed from the clinic at the providers discretion.     ?Again,  thank you for choosing [facility].  Our hope is that these requests will decrease the amount of time that you wait before being seen by our physicians.       ?_____________________________________________________________ ? ?Should you have questions after your visit to [facility], please contact our office at [phone] and follow the prompts.  Our office hours are 8:00 a.m. and 4:30 p.m. Monday - Friday.  Please note that voicemails left after 4:00 p.m. may not be returned until the following business day.  We are closed weekends and major holidays.  You do have access to a nurse 24-7, just call the main number to the clinic [phone] and do not press any options, hold on the line and a nurse will answer the phone.   ? ?For prescription refill requests, have your pharmacy contact our office and allow 72 hours.   ? ?Due to Covid, you will need to wear a mask upon entering the hospital. If you do not have a mask, a mask will be given to you at the Main Entrance upon arrival. For doctor visits, patients may have 1 support person age 50 or older with them. For treatment visits, patients can not have anyone with them due to social distancing guidelines and our immunocompromised population.  ? ? ? ?

## 2021-09-12 ENCOUNTER — Other Ambulatory Visit: Payer: Self-pay

## 2021-09-12 ENCOUNTER — Other Ambulatory Visit (HOSPITAL_COMMUNITY): Payer: Self-pay | Admitting: Physician Assistant

## 2021-09-12 DIAGNOSIS — E610 Copper deficiency: Secondary | ICD-10-CM

## 2021-09-12 DIAGNOSIS — D696 Thrombocytopenia, unspecified: Secondary | ICD-10-CM | POA: Diagnosis not present

## 2021-09-14 LAB — COPPER, URINE - RANDOM OR 24 HOUR
Copper / Creatinine Ratio: 38 ug/g creat (ref 0–49)
Copper, 24H Ur: 72 ug/24 hr — ABNORMAL HIGH (ref 3–35)
Copper, Ur: 35 ug/L
Creatinine(Crt),U: 0.93 g/L (ref 0.30–3.00)
Total Volume: 2050

## 2021-09-29 ENCOUNTER — Encounter (HOSPITAL_COMMUNITY): Payer: Self-pay

## 2021-09-29 NOTE — Progress Notes (Signed)
Per the request of Tarri Abernethy, PA-C, call placed to patient in an attempt to schedule follow-up. Unable to reach patient directly. Patient unable to be reached at [phone] and no VM available. Detailed VM left at [phone] offering the patient a follow-up visit with the provider of his choosing within the clinic. Awaiting call back.

## 2021-10-05 ENCOUNTER — Ambulatory Visit (HOSPITAL_COMMUNITY): Payer: 59 | Admitting: Physician Assistant

## 2021-10-10 ENCOUNTER — Telehealth (HOSPITAL_COMMUNITY): Payer: Self-pay | Admitting: *Deleted

## 2021-10-10 NOTE — Telephone Encounter (Signed)
Attempted to reach out to patient via home and cell phone.  Unable to reach at this time.  Will continue to follow up regarding office visit with Tarri Abernethy - PAC.

## 2021-10-11 ENCOUNTER — Telehealth (HOSPITAL_COMMUNITY): Payer: Self-pay | Admitting: *Deleted

## 2021-10-11 NOTE — Telephone Encounter (Signed)
I have been informed by RN Renda Rolls of the patient's desire to cancel his follow up appointments with our office.  As such, I have reached out to his PCP (FNP Mary-Margaret Hassell Done) to discuss the following:  "I have been following Mr. Cavins here at the [facility] for his thrombocytopenia and other issues.  Recently, I ran some additional testing with some concerning results that need additional follow-up.  However, patient does not wish to follow-up at this time and therefore I have not had a chance to discuss these findings with him in person.  Here is a brief summary of the findings that need to be addressed:   - Given his progressive thrombocytopenia and unexplained splenomegaly, there is some concern that he may have an indolent lymphoma or other bone marrow infiltrative process.  We would like to check CT abdomen/pelvis with contrast and would also like to obtain bone marrow biopsy.  Thrombocytopenia could still be due to immune thrombocytopenia, but due to his splenomegaly, we should at least rule out malignancy.   - We initially thought his thrombocytopenia may be due to copper deficiency.  However, his copper levels have failed to improve despite oral repletion.  Additional work-up revealed low ceruloplasmin and elevated urine copper levels, which raises concern for possible Wilson's disease.  He will need referral to gastroenterology for work-up of possible Wilson's disease (including possible liver biopsy), as well as referral to ophthalmology to evaluate for [facility] rings.   As you can see, the above findings need to be discussed with him in person and need additional follow-up. He was not forthcoming with [place] as to why he did not want to follow-up at our office anymore, so I wanted to bring these things to your attention.  If you can discuss the importance of following up on these results with him, I would appreciate it.  We would be happy to see him again at our office  to complete work-up and treatment of the above, but if he would like to seek care elsewhere, he should be referred to another hematologist for ongoing work-up and treatment.  Regardless of who is taking care of him, I just wanted to make sure these things were addressed!   Thanks for your help in collaborating on this patient's care!"

## 2021-10-11 NOTE — Telephone Encounter (Signed)
Reached out to patient to schedule a follow up appointment to discuss abnormal lab results and plan going forward.  States that he is not interested in an appointment at this time.  When asked if there was a specific reason he did not wish to proceed, he replied that he was just not interested and would call if he changed his mind.  Emphasized importance of reviewing results and potential negative outcome if not addressed.  Verbalized understanding.  Tarri Abernethy - PAC made aware.

## 2021-10-13 ENCOUNTER — Telehealth: Payer: Self-pay | Admitting: Nurse Practitioner

## 2021-10-13 NOTE — Telephone Encounter (Signed)
Reached out to patient yesterday evening myself and explained the importance of following up on these issues. Patients significant other stated that she spoke with the hematology office and she asked them to send everything they had found on the Internet so she and the patient could see it. I advised that even if that was done that he still needs follow up at the hematology office about these issues. She stated that she is willing to do a televisit. Shelah Lewandowsky spoke with significant other this morning and advised that it is extremely important that the patient follow up in office. Significant other advised that her and the patient would discuss it today and they would contact us back today and let us know what they have decided.

## 2021-10-25 NOTE — Telephone Encounter (Signed)
PCP's nurse, Leafy Ro, has already spoken with patient on 10/13/21 under separate telephone encounter.

## 2021-10-31 ENCOUNTER — Ambulatory Visit: Payer: Medicare Other | Admitting: Cardiology

## 2021-11-14 ENCOUNTER — Encounter: Payer: Self-pay | Admitting: Nurse Practitioner

## 2021-11-14 ENCOUNTER — Ambulatory Visit (INDEPENDENT_AMBULATORY_CARE_PROVIDER_SITE_OTHER): Payer: 59 | Admitting: Nurse Practitioner

## 2021-11-14 VITALS — BP 126/66 | HR 65 | Temp 97.5°F | Resp 20 | Ht 70.0 in | Wt 154.0 lb

## 2021-11-14 DIAGNOSIS — E782 Mixed hyperlipidemia: Secondary | ICD-10-CM | POA: Diagnosis not present

## 2021-11-14 DIAGNOSIS — E1122 Type 2 diabetes mellitus with diabetic chronic kidney disease: Secondary | ICD-10-CM

## 2021-11-14 DIAGNOSIS — N1832 Chronic kidney disease, stage 3b: Secondary | ICD-10-CM

## 2021-11-14 DIAGNOSIS — Z125 Encounter for screening for malignant neoplasm of prostate: Secondary | ICD-10-CM

## 2021-11-14 DIAGNOSIS — I10 Essential (primary) hypertension: Secondary | ICD-10-CM

## 2021-11-14 DIAGNOSIS — K219 Gastro-esophageal reflux disease without esophagitis: Secondary | ICD-10-CM

## 2021-11-14 DIAGNOSIS — I25119 Atherosclerotic heart disease of native coronary artery with unspecified angina pectoris: Secondary | ICD-10-CM

## 2021-11-14 DIAGNOSIS — F5101 Primary insomnia: Secondary | ICD-10-CM

## 2021-11-14 LAB — BAYER DCA HB A1C WAIVED: HB A1C (BAYER DCA - WAIVED): 6.7 % — ABNORMAL HIGH (ref 4.8–5.6)

## 2021-11-14 NOTE — Progress Notes (Signed)
Subjective:    Patient ID: Edward [name], male    DOB: 06/11/1950, 71 y.o.   MRN: [id number]   Chief Complaint: medical management of chronic issues     HPI:  Edward [name] is a 71 y.o. who identifies as a male who was assigned male at birth.   Social history: Lives with: by himself Work history: diasabiity   Comes in today for follow up of the following chronic medical issues:  1. Essential hypertension No c/o chest pain, sob or headache. Does not check blood pressure at home. BP Readings from Last 3 Encounters:  09/05/21 121/71  08/15/21 122/65  05/23/21 139/66     2. Mixed hyperlipidemia Does not watch diet and does no dedicated exercise. Lab Results  Component Value Date   CHOL 174 08/15/2021   HDL 25 (L) 08/15/2021   LDLCALC 118 (H) 08/15/2021   TRIG 171 (H) 08/15/2021   CHOLHDL 7.0 (H) 08/15/2021   The 10-year ASCVD risk score (Arnett DK, et al., 2019) is: 39.1%   3. Atherosclerosis of native coronary artery of native heart with angina pectoris [facility]) Last saw cardiology on 04/12/21. Review of office note revealed no change in plan of care.  4. Type 2 diabetes mellitus with stage 3b chronic kidney disease, without long-term current use of insulin ([place]) He doesn ot check his blood sugars very often. He is not watching his diet. Lab Results  Component Value Date   HGBA1C 7.3 (H) 08/15/2021     5. Stage 3b chronic kidney disease ([place]) No voiding issues. Lab Results  Component Value Date   CREATININE 1.57 (H) 08/25/2021     6. Gastroesophageal reflux disease without esophagitis Is on protonix daily and is doing well.  7. Primary insomnia Is on trazadone nightly and sleeps about 8 hours.   New complaints: Oncology called [place] 3 weeks ago and said he needed some testing done and was refusing to come in to be seen. They think there is something going on with his spleen. We spoke with his family and patient, and he has agreed to follow up with  oncology. Not sure when his next appointment is. He says he feels fine.  No Known Allergies Outpatient Encounter Medications as of 11/14/2021  Medication Sig   ACCU-CHEK AVIVA PLUS test strip USE TO CHECK BLOOD SUGAR ONCE DAILY   aspirin 81 MG tablet Take 81 mg by mouth daily.   atorvastatin (LIPITOR) 40 MG tablet Take 1 tablet (40 mg total) by mouth daily.   brimonidine (ALPHAGAN) 0.2 % ophthalmic solution SMARTSIG:In Eye(s)   Cholecalciferol (VITAMIN D3) 2000 UNITS TABS Take 1 tablet by mouth daily.   Copper 5 MG TABS Take 1 tablet (5 mg total) by mouth 2 (two) times daily.   Cyanocobalamin (B-12 PO) Take by mouth.   ferrous gluconate (FERGON) 324 MG tablet Take by mouth.   gemfibrozil (LOPID) 600 MG tablet TAKE 1 TABLET BY MOUTH TWICE DAILY BEFORE MEAL(S)   glipiZIDE (GLUCOTROL) 5 MG tablet 2 tablets in morning and 1 tablet at night   isosorbide mononitrate (IMDUR) 30 MG 24 hr tablet Take 1 tablet (30 mg total) by mouth daily. New 01/30/2018   LOKELMA 5 g packet SMARTSIG:5 Gram(s) By Mouth Twice a Week   metoprolol tartrate (LOPRESSOR) 25 MG tablet Take 0.5 tablets (12.5 mg total) by mouth in the morning and at bedtime.   nitroGLYCERIN (NITROSTAT) 0.4 MG SL tablet Place 1 tablet (0.4 mg total) under the tongue every 5 (  five) minutes x 3 doses as needed for chest pain (if no relief after 2nd dose, proceed to the ED for an evaluation).   Omega-3 Fatty Acids (FISH OIL) 1000 MG CAPS Take by mouth.   pantoprazole (PROTONIX) 40 MG tablet Take 1 tablet (40 mg total) by mouth daily.   traZODone (DESYREL) 100 MG tablet Take 1 tablet (100 mg total) by mouth at bedtime.   VELTASSA 8.4 g packet Take by mouth.   No facility-administered encounter medications on file as of 11/14/2021.    Past Surgical History:  Procedure Laterality Date   CATARACT EXTRACTION, BILATERAL     CORONARY ANGIOPLASTY WITH STENT PLACEMENT  2006    Family History  Problem Relation Age of Onset   Aneurysm Father         Brain aneurysm   Lung cancer Mother        Small cell carcinoma of lung,kidney and heart   Heart disease Mother    Cancer Brother        lungs   Diabetes Sister    Diabetes Sister    Diabetes Brother    Diabetes Brother       Controlled substance contract: n/a     Review of Systems  Constitutional:  Negative for diaphoresis.  Eyes:  Negative for pain.  Respiratory:  Negative for shortness of breath.   Cardiovascular:  Negative for chest pain, palpitations and leg swelling.  Gastrointestinal:  Negative for abdominal pain.  Endocrine: Negative for polydipsia.  Skin:  Negative for rash.  Neurological:  Negative for dizziness, weakness and headaches.  Hematological:  Does not bruise/bleed easily.  All other systems reviewed and are negative.      Objective:   Physical Exam Vitals and nursing note reviewed.  Constitutional:      Appearance: Normal appearance. He is well-developed.  HENT:     Head: Normocephalic.     Nose: Nose normal.     Mouth/Throat:     Mouth: Mucous membranes are moist.     Pharynx: Oropharynx is clear.  Eyes:     Pupils: Pupils are equal, round, and reactive to light.  Neck:     Thyroid: No thyroid mass or thyromegaly.     Vascular: No carotid bruit or JVD.     Trachea: Phonation normal.  Cardiovascular:     Rate and Rhythm: Normal rate and regular rhythm.  Pulmonary:     Effort: Pulmonary effort is normal. No respiratory distress.     Breath sounds: Normal breath sounds.  Abdominal:     General: Bowel sounds are normal.     Palpations: Abdomen is soft.     Tenderness: There is no abdominal tenderness.  Musculoskeletal:        General: Normal range of motion.     Cervical back: Normal range of motion and neck supple.  Lymphadenopathy:     Cervical: No cervical adenopathy.  Skin:    General: Skin is warm and dry.  Neurological:     Mental Status: He is alert and oriented to person, place, and time.  Psychiatric:        Behavior:  Behavior normal.        Thought Content: Thought content normal.        Judgment: Judgment normal.    BP 126/66   Pulse 65   Temp (!) 97.5 F (36.4 C) (Temporal)   Resp 20   Ht 5' 10" (1.778 m)   Wt 154 lb (69.9 kg)  SpO2 99%   BMI 22.10 kg/m   HGBA1c 6.7%      Assessment & Plan:  Shaheem Pichon [facility] comes in today with chief complaint of Medical Management of Chronic Issues   Diagnosis and orders addressed:  1. Essential hypertension Low sodium diet - CBC with Differential/Platelet - CMP14+EGFR  2. Mixed hyperlipidemia Low fat diet - Lipid panel  3. Atherosclerosis of native coronary artery of native heart with angina pectoris [facility]) Keep follow up with caridiology yearly  4. Type 2 diabetes mellitus with stage 3b chronic kidney disease, without long-term current use of insulin (HCC) Contineut to watch carbsoin diet - Bayer DCA Hb A1c Waived  5. Stage 3b chronic kidney disease ([place]) Labs pending  6. Gastroesophageal reflux disease without esophagitis Avoid spicy foods Do not eat 2 hours prior to bedtime  7. Primary insomnia Bedtime routine  8. Screening for prostate cancer Labs pending - PSA, total and free  Discussed oncology appointment- encouraged patien not to missappointmnet  Labs pending Health Maintenance reviewed Diet and exercise encouraged  Follow up plan: 3 months   Mary-Margaret Hassell Done, FNP

## 2021-11-15 LAB — CMP14+EGFR
ALT: 93 IU/L — ABNORMAL HIGH (ref 0–44)
AST: 288 IU/L — ABNORMAL HIGH (ref 0–40)
Albumin/Globulin Ratio: 1.3 (ref 1.2–2.2)
Albumin: 3.4 g/dL — ABNORMAL LOW (ref 3.9–4.9)
Alkaline Phosphatase: 65 IU/L (ref 44–121)
BUN/Creatinine Ratio: 11 (ref 10–24)
BUN: 18 mg/dL (ref 8–27)
Bilirubin Total: 2.8 mg/dL — ABNORMAL HIGH (ref 0.0–1.2)
CO2: 22 mmol/L (ref 20–29)
Calcium: 9.1 mg/dL (ref 8.6–10.2)
Chloride: 98 mmol/L (ref 96–106)
Creatinine, Ser: 1.58 mg/dL — ABNORMAL HIGH (ref 0.76–1.27)
Globulin, Total: 2.6 g/dL (ref 1.5–4.5)
Glucose: 57 mg/dL — ABNORMAL LOW (ref 70–99)
Potassium: 4 mmol/L (ref 3.5–5.2)
Sodium: 137 mmol/L (ref 134–144)
Total Protein: 6 g/dL (ref 6.0–8.5)
eGFR: 47 mL/min/{1.73_m2} — ABNORMAL LOW (ref >59)

## 2021-11-15 LAB — CBC WITH DIFFERENTIAL/PLATELET
Basophils Absolute: 0 10*3/uL (ref 0.0–0.2)
Basos: 0 %
EOS (ABSOLUTE): 0 10*3/uL (ref 0.0–0.4)
Eos: 1 %
Hematocrit: 35.5 % — ABNORMAL LOW (ref 37.5–51.0)
Hemoglobin: 12.2 g/dL — ABNORMAL LOW (ref 13.0–17.7)
Immature Grans (Abs): 0 10*3/uL (ref 0.0–0.1)
Immature Granulocytes: 0 %
Lymphocytes Absolute: 0.9 10*3/uL (ref 0.7–3.1)
Lymphs: 15 %
MCH: 33 pg (ref 26.6–33.0)
MCHC: 34.4 g/dL (ref 31.5–35.7)
MCV: 96 fL (ref 79–97)
Monocytes Absolute: 0.7 10*3/uL (ref 0.1–0.9)
Monocytes: 11 %
Neutrophils Absolute: 4.5 10*3/uL (ref 1.4–7.0)
Neutrophils: 73 %
Platelets: 61 10*3/uL — CL (ref 150–450)
RBC: 3.7 x10E6/uL — ABNORMAL LOW (ref 4.14–5.80)
RDW: 14.4 % (ref 11.6–15.4)
WBC: 6.2 10*3/uL (ref 3.4–10.8)

## 2021-11-15 LAB — PSA, TOTAL AND FREE
PSA, Free Pct: 41.3 %
PSA, Free: 0.33 ng/mL
Prostate Specific Ag, Serum: 0.8 ng/mL (ref 0.0–4.0)

## 2021-11-15 LAB — LIPID PANEL
Chol/HDL Ratio: 5.4 ratio — ABNORMAL HIGH (ref 0.0–5.0)
Cholesterol, Total: 98 mg/dL — ABNORMAL LOW (ref 100–199)
HDL: 18 mg/dL — ABNORMAL LOW (ref >39)
LDL Chol Calc (NIH): 59 mg/dL (ref 0–99)
Triglycerides: 108 mg/dL (ref 0–149)
VLDL Cholesterol Cal: 21 mg/dL (ref 5–40)

## 2021-11-18 ENCOUNTER — Encounter (HOSPITAL_COMMUNITY): Payer: Self-pay | Admitting: Emergency Medicine

## 2021-11-18 ENCOUNTER — Emergency Department (HOSPITAL_COMMUNITY): Payer: 59

## 2021-11-18 ENCOUNTER — Inpatient Hospital Stay (HOSPITAL_COMMUNITY): Payer: 59

## 2021-11-18 ENCOUNTER — Other Ambulatory Visit: Payer: Self-pay

## 2021-11-18 ENCOUNTER — Inpatient Hospital Stay (HOSPITAL_COMMUNITY)
Admission: EM | Admit: 2021-11-18 | Discharge: 2021-11-22 | DRG: 445 | Disposition: A | Discharge status: Home / Self Care | Payer: 59 | Attending: Internal Medicine | Admitting: Internal Medicine

## 2021-11-18 DIAGNOSIS — K828 Other specified diseases of gallbladder: Secondary | ICD-10-CM | POA: Diagnosis present

## 2021-11-18 DIAGNOSIS — I2510 Atherosclerotic heart disease of native coronary artery without angina pectoris: Secondary | ICD-10-CM | POA: Diagnosis present

## 2021-11-18 DIAGNOSIS — R918 Other nonspecific abnormal finding of lung field: Secondary | ICD-10-CM | POA: Diagnosis present

## 2021-11-18 DIAGNOSIS — R188 Other ascites: Secondary | ICD-10-CM

## 2021-11-18 DIAGNOSIS — I10 Essential (primary) hypertension: Secondary | ICD-10-CM | POA: Diagnosis present

## 2021-11-18 DIAGNOSIS — D7589 Other specified diseases of blood and blood-forming organs: Secondary | ICD-10-CM | POA: Diagnosis present

## 2021-11-18 DIAGNOSIS — K219 Gastro-esophageal reflux disease without esophagitis: Secondary | ICD-10-CM | POA: Diagnosis present

## 2021-11-18 DIAGNOSIS — K811 Chronic cholecystitis: Secondary | ICD-10-CM

## 2021-11-18 DIAGNOSIS — I25119 Atherosclerotic heart disease of native coronary artery with unspecified angina pectoris: Secondary | ICD-10-CM | POA: Diagnosis not present

## 2021-11-18 DIAGNOSIS — N1831 Chronic kidney disease, stage 3a: Secondary | ICD-10-CM | POA: Diagnosis not present

## 2021-11-18 DIAGNOSIS — Z955 Presence of coronary angioplasty implant and graft: Secondary | ICD-10-CM

## 2021-11-18 DIAGNOSIS — E1122 Type 2 diabetes mellitus with diabetic chronic kidney disease: Secondary | ICD-10-CM | POA: Diagnosis present

## 2021-11-18 DIAGNOSIS — K769 Liver disease, unspecified: Secondary | ICD-10-CM | POA: Diagnosis present

## 2021-11-18 DIAGNOSIS — K2100 Gastro-esophageal reflux disease with esophagitis, without bleeding: Secondary | ICD-10-CM | POA: Diagnosis present

## 2021-11-18 DIAGNOSIS — M6282 Rhabdomyolysis: Secondary | ICD-10-CM

## 2021-11-18 DIAGNOSIS — I700 Atherosclerosis of aorta: Secondary | ICD-10-CM | POA: Diagnosis present

## 2021-11-18 DIAGNOSIS — D509 Iron deficiency anemia, unspecified: Secondary | ICD-10-CM | POA: Diagnosis present

## 2021-11-18 DIAGNOSIS — R933 Abnormal findings on diagnostic imaging of other parts of digestive tract: Secondary | ICD-10-CM

## 2021-11-18 DIAGNOSIS — R531 Weakness: Principal | ICD-10-CM

## 2021-11-18 DIAGNOSIS — K8010 Calculus of gallbladder with chronic cholecystitis without obstruction: Principal | ICD-10-CM | POA: Diagnosis present

## 2021-11-18 DIAGNOSIS — E1121 Type 2 diabetes mellitus with diabetic nephropathy: Secondary | ICD-10-CM | POA: Diagnosis not present

## 2021-11-18 DIAGNOSIS — E871 Hypo-osmolality and hyponatremia: Secondary | ICD-10-CM | POA: Diagnosis present

## 2021-11-18 DIAGNOSIS — D631 Anemia in chronic kidney disease: Secondary | ICD-10-CM | POA: Diagnosis present

## 2021-11-18 DIAGNOSIS — R7989 Other specified abnormal findings of blood chemistry: Secondary | ICD-10-CM | POA: Diagnosis present

## 2021-11-18 DIAGNOSIS — K8020 Calculus of gallbladder without cholecystitis without obstruction: Secondary | ICD-10-CM | POA: Diagnosis present

## 2021-11-18 DIAGNOSIS — R197 Diarrhea, unspecified: Secondary | ICD-10-CM | POA: Diagnosis present

## 2021-11-18 DIAGNOSIS — E11649 Type 2 diabetes mellitus with hypoglycemia without coma: Secondary | ICD-10-CM | POA: Diagnosis present

## 2021-11-18 DIAGNOSIS — D696 Thrombocytopenia, unspecified: Secondary | ICD-10-CM | POA: Diagnosis present

## 2021-11-18 DIAGNOSIS — R0602 Shortness of breath: Secondary | ICD-10-CM | POA: Diagnosis not present

## 2021-11-18 DIAGNOSIS — N183 Chronic kidney disease, stage 3 unspecified: Secondary | ICD-10-CM | POA: Diagnosis present

## 2021-11-18 DIAGNOSIS — E782 Mixed hyperlipidemia: Secondary | ICD-10-CM | POA: Diagnosis present

## 2021-11-18 DIAGNOSIS — Z8249 Family history of ischemic heart disease and other diseases of the circulatory system: Secondary | ICD-10-CM

## 2021-11-18 DIAGNOSIS — K766 Portal hypertension: Secondary | ICD-10-CM | POA: Diagnosis present

## 2021-11-18 DIAGNOSIS — Z801 Family history of malignant neoplasm of trachea, bronchus and lung: Secondary | ICD-10-CM

## 2021-11-18 DIAGNOSIS — N1832 Chronic kidney disease, stage 3b: Secondary | ICD-10-CM | POA: Diagnosis present

## 2021-11-18 DIAGNOSIS — Z833 Family history of diabetes mellitus: Secondary | ICD-10-CM

## 2021-11-18 DIAGNOSIS — Z87891 Personal history of nicotine dependence: Secondary | ICD-10-CM

## 2021-11-18 DIAGNOSIS — K7460 Unspecified cirrhosis of liver: Secondary | ICD-10-CM | POA: Diagnosis present

## 2021-11-18 DIAGNOSIS — I129 Hypertensive chronic kidney disease with stage 1 through stage 4 chronic kidney disease, or unspecified chronic kidney disease: Secondary | ICD-10-CM | POA: Diagnosis present

## 2021-11-18 DIAGNOSIS — E860 Dehydration: Secondary | ICD-10-CM | POA: Diagnosis present

## 2021-11-18 DIAGNOSIS — N179 Acute kidney failure, unspecified: Secondary | ICD-10-CM | POA: Diagnosis present

## 2021-11-18 DIAGNOSIS — I252 Old myocardial infarction: Secondary | ICD-10-CM

## 2021-11-18 DIAGNOSIS — Z9842 Cataract extraction status, left eye: Secondary | ICD-10-CM

## 2021-11-18 DIAGNOSIS — K810 Acute cholecystitis: Secondary | ICD-10-CM | POA: Diagnosis present

## 2021-11-18 DIAGNOSIS — D689 Coagulation defect, unspecified: Secondary | ICD-10-CM | POA: Diagnosis present

## 2021-11-18 DIAGNOSIS — Z9841 Cataract extraction status, right eye: Secondary | ICD-10-CM

## 2021-11-18 LAB — COMPREHENSIVE METABOLIC PANEL
ALT: 120 U/L — ABNORMAL HIGH (ref 0–44)
AST: 274 U/L — ABNORMAL HIGH (ref 15–41)
Albumin: 2.4 g/dL — ABNORMAL LOW (ref 3.5–5.0)
Alkaline Phosphatase: 50 U/L (ref 38–126)
Anion gap: 10 (ref 5–15)
BUN: 34 mg/dL — ABNORMAL HIGH (ref 8–23)
CO2: 25 mmol/L (ref 22–32)
Calcium: 8.2 mg/dL — ABNORMAL LOW (ref 8.9–10.3)
Chloride: 99 mmol/L (ref 98–111)
Creatinine, Ser: 2.02 mg/dL — ABNORMAL HIGH (ref 0.61–1.24)
GFR, Estimated: 35 mL/min — ABNORMAL LOW (ref >60)
Glucose, Bld: 79 mg/dL (ref 70–99)
Potassium: 3.7 mmol/L (ref 3.5–5.1)
Sodium: 134 mmol/L — ABNORMAL LOW (ref 135–145)
Total Bilirubin: 3.1 mg/dL — ABNORMAL HIGH (ref 0.3–1.2)
Total Protein: 5.3 g/dL — ABNORMAL LOW (ref 6.5–8.1)

## 2021-11-18 LAB — URINALYSIS, ROUTINE W REFLEX MICROSCOPIC
Bacteria, UA: NONE SEEN
Bilirubin Urine: NEGATIVE
Glucose, UA: NEGATIVE mg/dL
Ketones, ur: NEGATIVE mg/dL
Leukocytes,Ua: NEGATIVE
Nitrite: NEGATIVE
Protein, ur: NEGATIVE mg/dL
Specific Gravity, Urine: 1.003 — ABNORMAL LOW (ref 1.005–1.030)
pH: 6 (ref 5.0–8.0)

## 2021-11-18 LAB — GLUCOSE, CAPILLARY
Glucose-Capillary: 111 mg/dL — ABNORMAL HIGH (ref 70–99)
Glucose-Capillary: 105 mg/dL — ABNORMAL HIGH (ref 70–99)

## 2021-11-18 LAB — MAGNESIUM: Magnesium: 2.1 mg/dL (ref 1.7–2.4)

## 2021-11-18 LAB — CBC
HCT: 30.3 % — ABNORMAL LOW (ref 39.0–52.0)
Hemoglobin: 10.4 g/dL — ABNORMAL LOW (ref 13.0–17.0)
MCH: 33.3 pg (ref 26.0–34.0)
MCHC: 34.3 g/dL (ref 30.0–36.0)
MCV: 97.1 fL (ref 80.0–100.0)
Platelets: 52 10*3/uL — ABNORMAL LOW (ref 150–400)
RBC: 3.12 MIL/uL — ABNORMAL LOW (ref 4.22–5.81)
RDW: 14.4 % (ref 11.5–15.5)
WBC: 5.1 10*3/uL (ref 4.0–10.5)
nRBC: 0 % (ref 0.0–0.2)

## 2021-11-18 LAB — PROTIME-INR
INR: 1.4 — ABNORMAL HIGH (ref 0.8–1.2)
Prothrombin Time: 17.4 seconds — ABNORMAL HIGH (ref 11.4–15.2)

## 2021-11-18 LAB — CK: Total CK: 6902 U/L — ABNORMAL HIGH (ref 49–397)

## 2021-11-18 LAB — LACTIC ACID, PLASMA: Lactic Acid, Venous: 1.9 mmol/L (ref 0.5–1.9)

## 2021-11-18 MED ORDER — INSULIN ASPART 100 UNIT/ML IJ SOLN: 0.0000 [IU] | Freq: Every day | INTRAMUSCULAR | Status: DC

## 2021-11-18 MED ORDER — ONDANSETRON HCL 4 MG/2ML IJ SOLN: 4.0000 mg | Freq: Four times a day (QID) | INTRAMUSCULAR | Status: DC | PRN

## 2021-11-18 MED ORDER — SODIUM BICARBONATE 650 MG PO TABS
650.0000 mg | ORAL_TABLET | Freq: Three times a day (TID) | ORAL | Status: DC
  Administered 2021-11-18 – 2021-11-22 (×12): 650 mg via ORAL
  Filled 2021-11-18 (×12): qty 1

## 2021-11-18 MED ORDER — INSULIN ASPART 100 UNIT/ML IJ SOLN: 0.0000 [IU] | Freq: Three times a day (TID) | INTRAMUSCULAR | Status: DC

## 2021-11-18 MED ORDER — FERROUS SULFATE 325 (65 FE) MG PO TABS
325.0000 mg | ORAL_TABLET | Freq: Every day | ORAL | Status: DC
Start: 2021-11-18 — End: 2021-11-22
  Administered 2021-11-18 – 2021-11-22 (×5): 325 mg via ORAL
  Filled 2021-11-18 (×5): qty 1

## 2021-11-18 MED ORDER — SODIUM CHLORIDE 0.9% FLUSH
3.0000 mL | Freq: Two times a day (BID) | INTRAVENOUS | Status: DC
  Administered 2021-11-19 – 2021-11-22 (×6): 3 mL via INTRAVENOUS

## 2021-11-18 MED ORDER — SODIUM CHLORIDE 0.9% FLUSH: 3.0000 mL | INTRAVENOUS | Status: DC | PRN

## 2021-11-18 MED ORDER — SODIUM CHLORIDE 0.9 % IV SOLN: INTRAVENOUS | Status: DC

## 2021-11-18 MED ORDER — ASPIRIN 81 MG PO CHEW
81.0000 mg | CHEWABLE_TABLET | Freq: Every day | ORAL | Status: DC
  Administered 2021-11-18 – 2021-11-22 (×5): 81 mg via ORAL
  Filled 2021-11-18 (×5): qty 1

## 2021-11-18 MED ORDER — BISACODYL 10 MG RE SUPP: 10.0000 mg | Freq: Every day | RECTAL | Status: DC | PRN

## 2021-11-18 MED ORDER — PANTOPRAZOLE SODIUM 40 MG PO TBEC
40.0000 mg | DELAYED_RELEASE_TABLET | Freq: Every day | ORAL | Status: DC
  Administered 2021-11-18 – 2021-11-22 (×5): 40 mg via ORAL
  Filled 2021-11-18 (×5): qty 1

## 2021-11-18 MED ORDER — METRONIDAZOLE 500 MG/100ML IV SOLN
500.0000 mg | Freq: Two times a day (BID) | INTRAVENOUS | Status: DC
  Administered 2021-11-18 – 2021-11-21 (×7): 500 mg via INTRAVENOUS
  Filled 2021-11-18 (×7): qty 100

## 2021-11-18 MED ORDER — SODIUM CHLORIDE 0.9% FLUSH
3.0000 mL | Freq: Two times a day (BID) | INTRAVENOUS | Status: DC
  Administered 2021-11-19 – 2021-11-21 (×3): 3 mL via INTRAVENOUS

## 2021-11-18 MED ORDER — ACETAMINOPHEN 325 MG PO TABS: 650.0000 mg | ORAL_TABLET | Freq: Four times a day (QID) | ORAL | Status: DC | PRN

## 2021-11-18 MED ORDER — HEPARIN SODIUM (PORCINE) 5000 UNIT/ML IJ SOLN
5000.0000 [IU] | Freq: Three times a day (TID) | INTRAMUSCULAR | Status: DC
  Administered 2021-11-18 – 2021-11-22 (×10): 5000 [IU] via SUBCUTANEOUS
  Filled 2021-11-18 (×11): qty 1

## 2021-11-18 MED ORDER — POLYETHYLENE GLYCOL 3350 17 G PO PACK
17.0000 g | PACK | Freq: Every day | ORAL | Status: DC | PRN
Start: 2021-11-18 — End: 2021-11-22

## 2021-11-18 MED ORDER — ACETAMINOPHEN 650 MG RE SUPP: 650.0000 mg | Freq: Four times a day (QID) | RECTAL | Status: DC | PRN

## 2021-11-18 MED ORDER — SODIUM CHLORIDE 0.9 % IV SOLN: INTRAVENOUS | Status: DC | PRN

## 2021-11-18 MED ORDER — SODIUM CHLORIDE 0.9 % IV SOLN
2.0000 g | INTRAVENOUS | Status: DC
  Administered 2021-11-18 – 2021-11-21 (×4): 2 g via INTRAVENOUS
  Filled 2021-11-18 (×4): qty 20

## 2021-11-18 MED ORDER — METOPROLOL TARTRATE 25 MG PO TABS
12.5000 mg | ORAL_TABLET | Freq: Two times a day (BID) | ORAL | Status: DC
  Administered 2021-11-18 – 2021-11-22 (×9): 12.5 mg via ORAL
  Filled 2021-11-18 (×9): qty 1

## 2021-11-18 MED ORDER — SODIUM CHLORIDE 0.9 % IV BOLUS
1000.0000 mL | Freq: Once | INTRAVENOUS | Status: AC
  Administered 2021-11-18: 1000 mL via INTRAVENOUS

## 2021-11-18 MED ORDER — ENSURE ENLIVE PO LIQD
237.0000 mL | Freq: Two times a day (BID) | ORAL | Status: DC
Start: 2021-11-18 — End: 2021-11-22
  Administered 2021-11-18 – 2021-11-22 (×6): 237 mL via ORAL

## 2021-11-18 MED ORDER — VITAMIN B-12 1000 MCG PO TABS
1000.0000 ug | ORAL_TABLET | Freq: Every day | ORAL | Status: DC
  Administered 2021-11-18 – 2021-11-22 (×5): 1000 ug via ORAL
  Filled 2021-11-18 (×5): qty 1

## 2021-11-18 MED ORDER — ONDANSETRON HCL 4 MG PO TABS: 4.0000 mg | ORAL_TABLET | Freq: Four times a day (QID) | ORAL | Status: DC | PRN

## 2021-11-18 MED ORDER — TRAZODONE HCL 50 MG PO TABS
50.0000 mg | ORAL_TABLET | Freq: Every evening | ORAL | Status: DC | PRN
  Administered 2021-11-19: 50 mg via ORAL
  Filled 2021-11-18: qty 1

## 2021-11-18 NOTE — Progress Notes (Signed)
Contacted by admitting provider regarding concern for acute cholecystitis in this patient. The patient presented to the hospital with weakness, difficulty walking, and back pain.  He also had elevated LFTs on outpatient blood work.    He has had an extensive workup in the hospital, including CT abdomen and pelvis, which demonstrates liver cirrhosis, mild ascites, portal venous hypertension, and splenomegaly.  Gallstones were visualized on this study.  Abdominal US was ordered which demonstrated gallbladder wall thickening, stones, and negative murphy's sign.  This was read as concern for acute cholecystitis.  He has no leukocytosis, ALP is normal at 50, AST 274, ALT 120, and Tbili 3.1.    Based on the patient's recent blood work, he is Child-Pugh Class C, which means his abdominal surgery peri-operative mortality is >80%.  Suspect that the gallbladder wall thickening and elevated LFTs are secondary to underlying liver disease, especially in the setting of no leukocytosis and no abdominal pain.  Recommend starting antibiotics and ordering HIDA scan to evaluate for cystic duct obstruction.  If HIDA is positive for non-visualization of the gallbladder, would recommend percutaneous cholecystostomy tube given the significant operative risk with his liver disease. I will plan to see the patient tomorrow. Please call with any questions or concerns.  Graciella Freer, DO [facility] [address], [place] [address] [phone] (office)

## 2021-11-18 NOTE — ED Notes (Signed)
Pt returned from CT °

## 2021-11-18 NOTE — ED Provider Notes (Signed)
[facility] Emergency Department Provider Note MRN:  [id number]  Arrival date & time: 11/18/21     Chief Complaint   Weakness History of Present Illness   Edward [name] is a 71 y.o. year-old male with a history of CKD, CAD, diabetes presenting to the ED with chief complaint of weakness.  Severe weakness over the past few days, barely able to walk, cannot raise his legs, cannot raise his arms.  Aches and pains in the back and the legs.  No chest pain or shortness of breath, no fever.  Review of Systems  A thorough review of systems was obtained and all systems are negative except as noted in the HPI and PMH.   Patient's Health History    Past Medical History:  Diagnosis Date   Cataract    CKD (chronic kidney disease) stage 3, GFR 30-59 ml/min (HCC)    Coronary atherosclerosis of native coronary artery    BMS LAD 2006; residual 75% distal RCA; EF 50%   Essential hypertension    Glaucoma    Low back pain    MI (myocardial infarction) ([place])    Anterior 2006   Mixed hyperlipidemia    Renal insufficiency    Type 2 diabetes mellitus (HCC)     Past Surgical History:  Procedure Laterality Date   CATARACT EXTRACTION, BILATERAL     CORONARY ANGIOPLASTY WITH STENT PLACEMENT  2006    Family History  Problem Relation Age of Onset   Aneurysm Father        Brain aneurysm   Lung cancer Mother        Small cell carcinoma of lung,kidney and heart   Heart disease Mother    Cancer Brother        lungs   Diabetes Sister    Diabetes Sister    Diabetes Brother    Diabetes Brother     Social History   Socioeconomic History   Marital status: Legally Separated    Spouse name: [name]    Number of children: 2   Years of education: 7th   Highest education level: 7th grade  Occupational History   Occupation: Retired  Tobacco Use   Smoking status: Former    Packs/day: 2.00    Years: 20.00    Total pack years: 40.00    Types: Cigarettes    Quit date:  05/08/1987    Years since quitting: 34.5   Smokeless tobacco: Never  Vaping Use   Vaping Use: Never used  Substance and Sexual Activity   Alcohol use: No    Alcohol/week: 0.0 standard drinks of alcohol   Drug use: No   Sexual activity: Not Currently  Other Topics Concern   Not on file  Social History Narrative   Lives alone    Social Determinants of Health   Financial Resource Strain: Low Risk  (07/11/2021)   Overall Financial Resource Strain (CARDIA)    Difficulty of Paying Living Expenses: Not very hard  Food Insecurity: No Food Insecurity (07/11/2021)   Hunger Vital Sign    Worried About Running Out of Food in the Last Year: Never true    Ran Out of Food in the Last Year: Never true  Transportation Needs: No Transportation Needs (07/11/2021)   PRAPARE - Hydrologist (Medical): No    Lack of Transportation (Non-Medical): No  Physical Activity: Insufficiently Active (07/11/2021)   Exercise Vital Sign    Days of Exercise per Week: 7 days  Minutes of Exercise per Session: 20 min  Stress: No Stress Concern Present (07/11/2021)   [place]    Feeling of Stress : Not at all  Social Connections: Socially Isolated (07/11/2021)   Social Connection and Isolation Panel [NHANES]    Frequency of Communication with Friends and Family: More than three times a week    Frequency of Social Gatherings with Friends and Family: More than three times a week    Attends Religious Services: Never    Marine scientist or Organizations: No    Attends Archivist Meetings: Never    Marital Status: Separated  Intimate Partner Violence: Not At Risk (07/11/2021)   Humiliation, Afraid, Rape, and Kick questionnaire    Fear of Current or Ex-Partner: No    Emotionally Abused: No    Physically Abused: No    Sexually Abused: No     Physical Exam   Vitals:   11/18/21 0547  BP: 128/68  Pulse: 64  Resp:  14  Temp: 97.9 F (36.6 C)  SpO2: 100%    CONSTITUTIONAL: Chronically ill-appearing, NAD NEURO/PSYCH:  Alert and oriented x 3, generalized weakness to all 4 extremities EYES:  eyes equal and reactive ENT/NECK:  no LAD, no JVD CARDIO: Regular rate, well-perfused, normal S1 and S2 PULM:  CTAB no wheezing or rhonchi GI/GU:  non-distended, non-tender MSK/SPINE:  No gross deformities, no edema SKIN:  no rash, atraumatic   *Additional and/or pertinent findings included in MDM below  Diagnostic and Interventional Summary    EKG Interpretation  Date/Time:  Saturday November 18 2021 05:46:50 EDT Ventricular Rate:  64 PR Interval:  179 QRS Duration: 83 QT Interval:  479 QTC Calculation: 495 R Axis:   73 Text Interpretation: Sinus rhythm Borderline low voltage, extremity leads Probable anteroseptal infarct, old Confirmed by Gerlene Fee [phone]) on 11/18/2021 6:33:33 AM       Labs Reviewed  CBC  COMPREHENSIVE METABOLIC PANEL  URINALYSIS, ROUTINE W REFLEX MICROSCOPIC  CK  LACTIC ACID, PLASMA  PROTIME-INR  MAGNESIUM    CT HEAD WO CONTRAST (5MM)    (Results Pending)  CT CERVICAL SPINE WO CONTRAST    (Results Pending)  CT CHEST ABDOMEN PELVIS WO CONTRAST    (Results Pending)  CT T-SPINE NO CHARGE    (Results Pending)  CT L-SPINE NO CHARGE    (Results Pending)    Medications  sodium chloride 0.9 % bolus 1,000 mL (1,000 mLs Intravenous New Bag/Given 11/18/21 0603)     Procedures  /  Critical Care Procedures  ED Course and Medical Decision Making  Initial Impression and Ddx Differential diagnosis includes electrolyte disturbance, dehydration, tick bite paralysis, hypokalemia underlying malignancy, awaiting labs, will consider CT imaging.  Patient also having issues with urination, and so spinal lesion is considered.  Past medical/surgical history that increases complexity of ED encounter: Diabetes, CKD  Interpretation of Diagnostics Labs and CT imaging pending. Patient  Reassessment and Ultimate Disposition/Management     Signed out to oncoming provider.  Patient management required discussion with the following services or consulting groups:  None  Complexity of Problems Addressed Acute illness or injury that poses threat of life of bodily function  Additional Data Reviewed and Analyzed Further history obtained from: None  Additional Factors Impacting ED Encounter Risk Consideration of hospitalization  Barth Kirks. [place] Small, [place] [email or web address]  Final Clinical Impressions(s) / ED Diagnoses     ICD-10-CM  1. Weakness  R53.1       ED Discharge Orders     None        Discharge Instructions Discussed with and Provided to Patient:   Discharge Instructions   None      Maudie Flakes, MD 11/18/21 [phone]

## 2021-11-18 NOTE — ED Notes (Signed)
Ultrasound bedside.

## 2021-11-18 NOTE — ED Notes (Addendum)
MD Bero at bedside.  Patient reporting generalized weakness and inability to walk.  Patient able to move all extremities freely upon assessment.

## 2021-11-18 NOTE — ED Triage Notes (Addendum)
Pt with increased weakness. Pt states he has been weak for 2-3 weeks but has not been able to walk over the last couple of days. Per son, pt had some blood work drawn recently and his "liver enzymes were elevated". Son states that pt is normally ambulatory and does not require any assistance. States pt has a decreased appetite and that pt has been having difficulty urinating.

## 2021-11-18 NOTE — ED Notes (Signed)
EDP at bedside at this time.  

## 2021-11-18 NOTE — H&P (Signed)
Patient Demographics:    Edward [name], is a 71 y.o. male  MRN: [id number]   DOB - 05/05/1951  Admit Date - 11/18/2021  Outpatient Primary MD for the patient is [name], FNP   Assessment & Plan:   Assessment and Plan:  1)Acute calculus cholecystitis--- CT abdomen and pelvis and right upper quadrant ultrasound consistent with gallstones and cholecystitis -Discussed with general surgeon Dr. Okey Dupre, she recommends HIDA scan -Official surgical consult pending -Will inquire from GI service if patient needs MRCP or ERCP -IV Rocephin and Flagyl empirically -As needed pain medication and antiemetics -He may need a cholecystectomy tube by IR if HIDA scan suggest cystic duct obstruction  2)Liver cirrhosis with ascites and portal hypertension---??? Etiology -No significant alcohol use -INR 1.4, bilirubin is 3.1 -Random Urine Copper high at 72 (09/12/21) -Patient takes atorvastatin and copper supplements -??? Wilson's disease in the setting of low copper and low ceruloplasmin.  Low ceruloplasmin may also be from copper deficiency-- may need ophthalmology for slit lamp exam to rule out [employer] rings. -Viral hepatitis profile was negative in 2021 - 3)History of copper deficiency--- has been on copper replacement for over 1 year -Random Urine Copper high at 72 (09/12/21) -Hold copper tablets as copper toxicity can lead to hepatic dysfunction  4)Elevated LFTs/elevated CPK -AST 288 >> 274  ALT 93 >> 120 T. Bili 2.8 >> 3.1 -INR 1.4 -CT abdomen and pelvis and right upper quadrant ultrasound consistent with gallstone cholecystitis -Elevated CPK noted----Total CPK 6902 -Elevated copper levels noted -Stop copper supplements and Stop atorvastatin  5)Anemia and Thrombocytopenia--- with macrocytosis  --diagnosed in May 2021 -- SPEP in June 2021 was normal.  Erythropoietin normal at 11.9. -Previously declined bone marrow biopsy for evaluation of possible early MDS --Hgb 10.4, Platelets 52 -Previously treated with copper supplements -Prior work-up revealed somewhat low iron --- B12 and Folate were Not low  6)AKI----acute kidney injury on CKD stage -3B ---Creatinine 2.02 (baseline 1.5 to 1.6) -Suspect due to dehydration -renally adjust medications, avoid nephrotoxic agents / dehydration  / hypotension -Patient usually follows with Dr. Theador Hawthorne  7)Hyponatremia------ suspect dehydration related, sodium 134, -Give IV fluids, avoid excessive free water  8)DM2---  A1c- 6.7 on 11/14/2021.[name]  Reflecting good diabetic control PTA Use Novolog/Humalog Sliding scale insulin with Accu-Cheks/Fingersticks as ordered   9)-Esophagitis--- CT chest shows -Patulous and thickened esophagus. -Protonix as ordered May need EGD in the future  10)Social/ethics--- Discussed with patient's ex-wife and son [name] at bedside -Patient is a full code  Disposition/Need for in-Hospital Stay- patient unable to be discharged at this time due to -gallstones with cholecystitis and elevated LFTs with cirrhosis requiring IV antibiotics and further investigation*  Status is: Inpatient  Remains inpatient appropriate because:   Dispo: The patient is from: Home              Anticipated d/c is to: Home  Anticipated d/c date is: > 3 days              Patient currently is not medically stable to d/c. Barriers: Not Clinically Stable-    With History of - Reviewed by me  Past Medical History:  Diagnosis Date   Cataract    CKD (chronic kidney disease) stage 3, GFR 30-59 ml/min (HCC)    Coronary atherosclerosis of native coronary artery    BMS LAD 2006; residual 75% distal RCA; EF 50%   Essential hypertension    Glaucoma    Low back pain    MI (myocardial infarction) ([facility])    Anterior 2006   Mixed  hyperlipidemia    Renal insufficiency    Type 2 diabetes mellitus ([place])       Past Surgical History:  Procedure Laterality Date   CATARACT EXTRACTION, BILATERAL     CORONARY ANGIOPLASTY WITH STENT PLACEMENT  2006    Chief Complaint  Patient presents with   Multiple complaints      HPI:    Edward [name]  is a 71 y.o. male with past medical history relevant for moderate thrombocytopenia, macrocytic anemia with superimposed iron deficiency, copper deficiency, CKD 3B, HLD, CAD, DM2 and HTN who presents to the ED with worsening fatigue, weakness, myalgias and malaise -Symptoms have worsened over the last  month -Patient has been more sedentary over the last couple weeks - Additional history obtained from patient's ex-wife and son [name] at bedside -Has nausea but no vomiting -No fever  Or chills  No chest pains, no palpitations no dizziness -Patient denies alcohol use -In the ED UA without significant abnormalities -Magnesium and lactic acid WNL -INR 1.4 -Total CPK 6902 -Sodium 134, Hgb 10.4, Platelets 52 --Creatinine 2.02 (baseline 1.5 to 1.6) -AST 288 >> 274  ALT 93 >> 120 T. Bili 2.8 >> 3.1 A1c- 6.7 on 11/14/2021.[name] Random Urine Copper high at 72 (09/12/21)  -Right upper quadrant ultrasound with gallbladder wall thickening and gallstones suggesting acute cholecystitis as well as finding of liver cirrhosis with ascites -CT of the C-spine, T and L-spine without metastatic findings or other acute findings -CT head without acute findings -CT chest abdomen and pelvis with finding of liver cirrhosis with portal hypertension with small volume ascites along with splenomegaly -Small pulmonary nodules measuring 5 mm or less-likely benign -Patulous and thickened esophagus -Gallstones    Review of systems:    In addition to the HPI above,   A full Review of  Systems was done, all other systems reviewed are negative except as noted above in HPI , .    Social History:  Reviewed by  me    Social History   Tobacco Use   Smoking status: Former    Packs/day: 2.00    Years: 20.00    Total pack years: 40.00    Types: Cigarettes    Quit date: 05/08/1987    Years since quitting: 34.5   Smokeless tobacco: Never  Substance Use Topics   Alcohol use: No    Alcohol/week: 0.0 standard drinks of alcohol    Family History :  Reviewed by me    Family History  Problem Relation Age of Onset   Aneurysm Father        Brain aneurysm   Lung cancer Mother        Small cell carcinoma of lung,kidney and heart   Heart disease Mother    Cancer Brother        lungs  Diabetes Sister    Diabetes Sister    Diabetes Brother    Diabetes Brother      Home Medications:   Prior to Admission medications   Medication Sig Start Date End Date Taking? Authorizing Provider  acetaminophen (TYLENOL) 500 MG tablet Take 500 mg by mouth every 6 (six) hours as needed.   Yes [provider]  aspirin 81 MG tablet Take 81 mg by mouth daily.   Yes [provider]  atorvastatin (LIPITOR) 40 MG tablet Take 1 tablet (40 mg total) by mouth daily. 08/15/21  Yes Martin, Mary-Margaret, FNP  brimonidine (ALPHAGAN) 0.2 % ophthalmic solution Place 1 drop into both eyes 2 (two) times daily. 03/14/20  Yes [provider]  Cholecalciferol (VITAMIN D3) 2000 UNITS TABS Take 1 tablet by mouth daily.   Yes [provider]  Copper 5 MG TABS Take 1 tablet (5 mg total) by mouth 2 (two) times daily. Patient taking differently: Take 2.5 mg by mouth 2 (two) times daily. 09/05/21  Yes Pennington, Rebekah M, PA-C  Cyanocobalamin (B-12 PO) Take by mouth.   Yes [provider]  gemfibrozil (LOPID) 600 MG tablet TAKE 1 TABLET BY MOUTH TWICE DAILY BEFORE MEAL(S) 08/15/21  Yes Hassell Done, Mary-Margaret, FNP  glipiZIDE (GLUCOTROL) 5 MG tablet 2 tablets in morning and 1 tablet at night Patient taking differently: Take 5 mg by mouth See admin instructions. 2 tablets in morning and 1 tablet at  evening 08/15/21  Yes Martin, Mary-Margaret, FNP  isosorbide mononitrate (IMDUR) 30 MG 24 hr tablet Take 1 tablet (30 mg total) by mouth daily. New 01/30/2018 08/15/21 11/18/21 Yes Martin, Mary-Margaret, FNP  LOKELMA 5 g packet Take 5 g by mouth 2 (two) times a week. Monday and Thursdays 03/09/21  Yes [provider]  metoprolol tartrate (LOPRESSOR) 25 MG tablet Take 0.5 tablets (12.5 mg total) by mouth in the morning and at bedtime. 08/15/21 08/15/22 Yes Martin, Mary-Margaret, FNP  nitroGLYCERIN (NITROSTAT) 0.4 MG SL tablet Place 1 tablet (0.4 mg total) under the tongue every 5 (five) minutes x 3 doses as needed for chest pain (if no relief after 2nd dose, proceed to the ED for an evaluation). 10/06/20  Yes Satira Sark, MD  Omega-3 Fatty Acids (FISH OIL) 1000 MG CAPS Take by mouth.   Yes [provider]  pantoprazole (PROTONIX) 40 MG tablet Take 1 tablet (40 mg total) by mouth daily. 08/15/21  Yes Martin, Mary-Margaret, FNP  sodium bicarbonate 650 MG tablet Take by mouth. 08/12/21  Yes [provider]  traZODone (DESYREL) 100 MG tablet Take 1 tablet (100 mg total) by mouth at bedtime. 08/15/21  Yes Martin, Mary-Margaret, FNP  VELTASSA 8.4 g packet Take 8.4 g by mouth daily. 06/16/21  Yes [provider]  [place] test strip USE TO CHECK BLOOD SUGAR ONCE DAILY 11/12/17   Hassell Done, Mary-Margaret, FNP     Allergies:    No Known Allergies   Physical Exam:   Vitals  Blood pressure (!) 117/59, pulse 92, temperature 97.9 F (36.6 C), temperature source Oral, resp. rate 16, height 5' 10"  (1.778 m), weight 72.5 kg, SpO2 95 %.  Physical Examination: General appearance - alert,  in no distress  Mental status - alert, oriented to person, place, and time,  Eyes - sclera anicteric Mouth-poor dentition Neck - supple, no JVD elevation , Chest - clear  to auscultation bilaterally, symmetrical air movement,  Heart - S1 and S2 normal, regular  Abdomen - soft,  nondistended, +BS, right upper quadrant discomfort on palpation without definite Murphy sign, no rebound or guarding Neurological - screening mental status exam normal, neck supple without rigidity, cranial nerves II through XII intact, DTR's normal and symmetric Extremities - no pedal edema noted, intact peripheral pulses  Skin - warm, dry     Data Review:    CBC Recent Labs  Lab 11/14/21 0820 11/18/21 0644  WBC 6.2 5.1  HGB 12.2* 10.4*  HCT 35.5* 30.3*  PLT 61* 52*  MCV 96 97.1  MCH 33.0 33.3  MCHC 34.4 34.3  RDW 14.4 14.4  LYMPHSABS 0.9  --   EOSABS 0.0  --   BASOSABS 0.0  --    ------------------------------------------------------------------------------------------------------------------  Chemistries  Recent Labs  Lab 11/14/21 0820 11/18/21 0644  NA 137 134*  K 4.0 3.7  CL 98 99  CO2 22 25  GLUCOSE 57* 79  BUN 18 34*  CREATININE 1.58* 2.02*  CALCIUM 9.1 8.2*  MG  --  2.1  AST 288* 274*  ALT 93* 120*  ALKPHOS 65 50  BILITOT 2.8* 3.1*   ------------------------------------------------------------------------------------------------------------------ estimated creatinine clearance is 35.4 mL/min (A) (by C-G formula based on SCr of 2.02 mg/dL (H)). ------------------------------------------------------------------------------------------------------------------ No results for input(s): "TSH", "T4TOTAL", "T3FREE", "THYROIDAB" in the last 72 hours.  Invalid input(s): "FREET3"   Coagulation profile Recent Labs  Lab 11/18/21 0644  INR 1.4*   ------------------------------------------------------------------------------------------------------------------- No results for input(s): "DDIMER" in the last 72 hours. -------------------------------------------------------------------------------------------------------------------  Cardiac Enzymes No results for input(s): "CKMB", "TROPONINI", "MYOGLOBIN" in the last 168 hours.  Invalid input(s):  "CK" ------------------------------------------------------------------------------------------------------------------ No results found for: "BNP"   ---------------------------------------------------------------------------------------------------------------  Urinalysis    Component Value Date/Time   COLORURINE YELLOW 11/18/2021 [place] 11/18/2021 0855   LABSPEC 1.003 (L) 11/18/2021 0855   PHURINE 6.0 11/18/2021 0855   GLUCOSEU NEGATIVE 11/18/2021 0855   HGBUR LARGE (A) 11/18/2021 0855   BILIRUBINUR NEGATIVE 11/18/2021 0855   KETONESUR NEGATIVE 11/18/2021 0855   PROTEINUR NEGATIVE 11/18/2021 0855   NITRITE NEGATIVE 11/18/2021 0855   LEUKOCYTESUR NEGATIVE 11/18/2021 0855    ----------------------------------------------------------------------------------------------------------------   Imaging Results:    US Abdomen Limited RUQ (LIVER/GB)  Result Date: 11/18/2021 CLINICAL DATA:  Abnormal LFTs. EXAM: ULTRASOUND ABDOMEN LIMITED RIGHT UPPER QUADRANT COMPARISON:  CT of the abdomen and pelvis 11/18/2021 FINDINGS: Gallbladder: Gallbladder wall is thickened measuring 5.3 mm. No sonographic Percell Miller sign is described. Shadowing gallstones measure up to 11 mm. Common bile duct: Diameter: 3.0 mm, within normal limits. Liver: Liver is shrunken within nodular appearance. Heterogeneous increased echogenicity noted. No discrete lesions are present. Portal vein is patent on color Doppler imaging with normal direction of blood flow towards the liver. Other: Moderate abdominal ascites is present. IMPRESSION: 1. Gallbladder wall thickening with cholelithiasis suggesting acute cholecystitis. 2. Findings suggestive of hepatic cirrhosis. 3. Ascites. Electronically Signed   By: San Morelle M.D.   On: 11/18/2021 12:01   CT CHEST ABDOMEN PELVIS WO CONTRAST  Result Date: 11/18/2021 CLINICAL DATA:  71 year old male history of weakness. Metastatic disease. EXAM: CT CHEST, ABDOMEN  AND PELVIS WITHOUT CONTRAST TECHNIQUE: Multidetector CT imaging of the chest, abdomen and pelvis was performed following the standard protocol without IV contrast. RADIATION DOSE REDUCTION: This exam was performed according to the departmental dose-optimization program which includes automated exposure control, adjustment of the mA and/or kV according to patient size and/or use of iterative reconstruction technique. COMPARISON:  No priors. FINDINGS: CT CHEST FINDINGS Cardiovascular: Heart size is normal. There is no  significant pericardial fluid, thickening or pericardial calcification. There is aortic atherosclerosis, as well as atherosclerosis of the great vessels of the mediastinum and the coronary arteries, including calcified atherosclerotic plaque in the left main, left anterior descending, left circumflex and right coronary arteries. No pathologically enlarged lymph nodes. Mediastinum/Nodes: No pathologically enlarged mediastinal or hilar lymph nodes. Please note that accurate exclusion of hilar adenopathy is limited on noncontrast CT scans. Esophagus is mildly patulous and thickened without a discrete esophageal mass confidently identified. No axillary lymphadenopathy. Lungs/Pleura: 3 mm right upper lobe pulmonary nodule (axial image 79 of series 5). 5 mm right lower lobe pulmonary nodule (axial image 91 of series 5). No larger more suspicious appearing pulmonary nodules or masses are noted. No acute consolidative airspace disease. No pleural effusions. Patchy areas of peripheral predominant ground-glass attenuation, septal thickening and mild subpleural reticulation are noted throughout the mid to lower lungs bilaterally. No honeycombing. Musculoskeletal: There are no aggressive appearing lytic or blastic lesions noted in the visualized portions of the skeleton. CT ABDOMEN PELVIS FINDINGS Hepatobiliary: Liver has a shrunken appearance and nodular contour, indicative of underlying cirrhosis. No definite  discrete cystic or solid hepatic lesions are confidently identified on today's noncontrast CT examination. Calcified gallstones lying dependently in the gallbladder. Gallbladder is moderately distended. Accurate assessment for pericholecystic fluid and inflammation is limited by presence of perihepatic ascites. Pancreas: No definite pancreatic mass. Haziness in the peripancreatic fat. No well-defined peripancreatic fluid collections. Spleen: Spleen is enlarged measuring 14.8 x 9.0 x 14.4 cm (estimated splenic volume of 959 mL) . Adrenals/Urinary Tract: Unenhanced appearance of the kidneys and bilateral adrenal glands is normal. No hydroureteronephrosis. Urinary bladder is unremarkable in appearance. Stomach/Bowel: Unenhanced appearance of the stomach is unremarkable. No pathologic dilatation of small bowel or colon. Numerous colonic diverticulae are noted, particularly in the sigmoid colon. No definite focal surrounding inflammatory changes to clearly indicate acute diverticulitis are noted at this time (assessment is limited by presence of small volume of ascites). Appendix is not confidently identified. Vascular/Lymphatic: Atherosclerotic calcifications throughout the abdominal aorta and pelvic vasculature. Portal vein is dilated measuring 16 mm in the porta hepatis. No definite lymphadenopathy noted in the abdomen or pelvis on today's noncontrast CT examination. Reproductive: Prostate gland and seminal vesicles are unremarkable in appearance. Other: Small volume of ascites.  No pneumoperitoneum. Musculoskeletal: There are no aggressive appearing lytic or blastic lesions noted in the visualized portions of the skeleton. IMPRESSION: 1. Cirrhosis with small volume of ascites. Dilated portal vein and splenomegaly indicative of portal hypertension. 2. Small pulmonary nodules measuring 5 mm or less in size in the right lung, nonspecific, but statistically likely benign. No follow-up needed if patient is low-risk (and  has no known or suspected primary neoplasm). Non-contrast chest CT can be considered in 12 months if patient is high-risk. This recommendation follows the consensus statement: Guidelines for Management of Incidental Pulmonary Nodules Detected on CT Images: From the Fleischner Society 2017; Radiology 2017; 284:228-243. 3. No other definite signs of potential metastatic disease noted elsewhere in the chest, abdomen or pelvis on today's noncontrast CT examination. 4. Esophagus appears mildly patulous and diffusely thickened without discrete esophageal mass. Further evaluation with nonemergent endoscopy should be considered to better evaluate these findings in the near future. 5. Cholelithiasis without evidence of acute cholecystitis at this time. 6. Aortic atherosclerosis, in addition to left main and three-vessel coronary artery disease. Please note that although the presence of coronary artery calcium documents the presence of coronary artery disease, the severity  of this disease and any potential stenosis cannot be assessed on this non-gated CT examination. Assessment for potential risk factor modification, dietary therapy or pharmacologic therapy may be warranted, if clinically indicated. 7. Additional incidental findings, as above. Electronically Signed   By: Vinnie Langton M.D.   On: 11/18/2021 09:35   CT L-SPINE NO CHARGE  Result Date: 11/18/2021 CLINICAL DATA:  71 year old male being evaluated for metastatic disease. Weakness. Unable to walk. EXAM: CT LUMBAR SPINE WITHOUT CONTRAST TECHNIQUE: Technique: Multiplanar CT images of the lumbar spine were reconstructed from contemporary CT of the Abdomen and Pelvis. RADIATION DOSE REDUCTION: This exam was performed according to the departmental dose-optimization program which includes automated exposure control, adjustment of the mA and/or kV according to patient size and/or use of iterative reconstruction technique. CONTRAST:  None COMPARISON:  Thoracic spine  CT, CT Chest, Abdomen, and Pelvis today reported separately. FINDINGS: Segmentation: Normal, concordant with the thoracic spine numbering today. Alignment: Normal lumbar lordosis. No spondylolisthesis. Minimal levoconvex lumbar scoliosis. Vertebrae: Bone mineralization is within normal limits for age. No lumbar vertebral fracture or lesion identified. Visible sacrum and SI joints appear intact. Paraspinal and other soft tissues: Abdominal and pelvic viscera are detailed separately. Lumbar paraspinal soft tissues are within normal limits. Disc levels: Age-appropriate lumbar spine degeneration. Disc bulging and endplate spurring with mild if any associated lumbar spinal stenosis. IMPRESSION: 1. Age-appropriate noncontrast CT appearance of the Lumbar Spine. 2.  CT Chest, Abdomen, and Pelvis today are reported separately. Electronically Signed   By: Genevie Ann M.D.   On: 11/18/2021 09:34   CT T-SPINE NO CHARGE  Result Date: 11/18/2021 CLINICAL DATA:  71 year old male being evaluated for metastatic disease. Weakness. Unable to walk. EXAM: CT THORACIC SPINE WITHOUT CONTRAST TECHNIQUE: Multiplanar CT images of the thoracic spine were reconstructed from contemporary CT of the Chest. RADIATION DOSE REDUCTION: This exam was performed according to the departmental dose-optimization program which includes automated exposure control, adjustment of the mA and/or kV according to patient size and/or use of iterative reconstruction technique. CONTRAST:  None COMPARISON:  CT cervical spine, CT Chest, Abdomen, and Pelvis today reported separately. FINDINGS: Limited cervical spine imaging:  Reported separately today. Thoracic spine segmentation:  Normal. Alignment: Normal for age thoracic kyphosis. No spondylolisthesis. No scoliosis. Vertebrae: Bone mineralization is within normal limits for age. No thoracic vertebral lesion by CT. No thoracic vertebral compression or fracture identified. Visible posterior ribs appear intact.  Paraspinal and other soft tissues: Chest and abdominal viscera are reported separately. Thoracic paraspinal soft tissues are within normal limits. Disc levels: Age-appropriate thoracic spine degeneration. No CT evidence of thoracic spinal stenosis. Unremarkable noncontrast appearance of the thoracic spinal canal. IMPRESSION: 1. Normal for age noncontrast CT appearance of the Thoracic Spine. 2.  CT Chest, Abdomen, and Pelvis today are reported separately. Electronically Signed   By: Genevie Ann M.D.   On: 11/18/2021 09:30   CT HEAD WO CONTRAST (5MM)  Result Date: 11/18/2021 CLINICAL DATA:  71 year old male being evaluated for metastatic disease. Weakness. Unable to walk. EXAM: CT HEAD WITHOUT CONTRAST TECHNIQUE: Contiguous axial images were obtained from the base of the skull through the vertex without intravenous contrast. RADIATION DOSE REDUCTION: This exam was performed according to the departmental dose-optimization program which includes automated exposure control, adjustment of the mA and/or kV according to patient size and/or use of iterative reconstruction technique. COMPARISON:  None Available. FINDINGS: Brain: Fairly normal for age cerebral volume. No midline shift, ventriculomegaly, mass effect, evidence of mass lesion, intracranial  hemorrhage or evidence of cortically based acute infarction. Gray-white matter differentiation is within normal limits throughout the brain. No encephalomalacia or cerebral edema identified. Vascular: Calcified atherosclerosis at the skull base. Skull: No acute or suspicious bone lesion identified. Sinuses/Orbits: Visualized paranasal sinuses and mastoids are well aerated. Trace bilateral mastoid air cell fluid. Small mucous retention cyst right maxillary sinus. Other: Negative visible nasopharynx. No suspicious orbit or scalp soft tissue finding. IMPRESSION: Negative for age noncontrast Head CT. Note that early metastatic disease to the brain is not excluded in the absence of  intravenous contrast. Electronically Signed   By: Genevie Ann M.D.   On: 11/18/2021 09:26   CT CERVICAL SPINE WO CONTRAST  Result Date: 11/18/2021 CLINICAL DATA:  71 year old male being evaluated for metastatic disease. Weakness. Unable to walk. EXAM: CT CERVICAL SPINE WITHOUT CONTRAST TECHNIQUE: Multidetector CT imaging of the cervical spine was performed without intravenous contrast. Multiplanar CT image reconstructions were also generated. RADIATION DOSE REDUCTION: This exam was performed according to the departmental dose-optimization program which includes automated exposure control, adjustment of the mA and/or kV according to patient size and/or use of iterative reconstruction technique. COMPARISON:  Head CT reported separately. FINDINGS: Alignment: Mild straightening of cervical lordosis. Cervicothoracic junction alignment is within normal limits. Bilateral posterior element alignment is within normal limits. Skull base and vertebrae: Bone mineralization is within normal limits for age. Visualized skull base is intact. No atlanto-occipital dissociation. No acute or suspicious osseous lesion identified in the cervical spine. Soft tissues and spinal canal: No prevertebral fluid or swelling. No visible canal hematoma. Negative noncontrast visible neck soft tissues except for calcified carotid atherosclerosis. Disc levels: Ordinary lower cervical disc and endplate degeneration, bulky at C6-C7, eccentric to the left, and associated with up to mild spinal stenosis at that level. But no other convincing cervical spinal stenosis by CT. There is associated moderate to severe left C7 proximal foraminal stenosis. Upper chest: Chest CT, thoracic spine CT reported separately. IMPRESSION: 1. No acute or metastatic process identified in the Cervical Spine by noncontrast CT. 2. Ordinary cervical spine degeneration at C6-C7 with suspected mild spinal stenosis and moderate to severe left C7 foraminal stenosis. Electronically  Signed   By: Genevie Ann M.D.   On: 11/18/2021 09:24    Radiological Exams on Admission: US Abdomen Limited RUQ (LIVER/GB)  Result Date: 11/18/2021 CLINICAL DATA:  Abnormal LFTs. EXAM: ULTRASOUND ABDOMEN LIMITED RIGHT UPPER QUADRANT COMPARISON:  CT of the abdomen and pelvis 11/18/2021 FINDINGS: Gallbladder: Gallbladder wall is thickened measuring 5.3 mm. No sonographic Percell Miller sign is described. Shadowing gallstones measure up to 11 mm. Common bile duct: Diameter: 3.0 mm, within normal limits. Liver: Liver is shrunken within nodular appearance. Heterogeneous increased echogenicity noted. No discrete lesions are present. Portal vein is patent on color Doppler imaging with normal direction of blood flow towards the liver. Other: Moderate abdominal ascites is present. IMPRESSION: 1. Gallbladder wall thickening with cholelithiasis suggesting acute cholecystitis. 2. Findings suggestive of hepatic cirrhosis. 3. Ascites. Electronically Signed   By: San Morelle M.D.   On: 11/18/2021 12:01   CT CHEST ABDOMEN PELVIS WO CONTRAST  Result Date: 11/18/2021 CLINICAL DATA:  71 year old male history of weakness. Metastatic disease. EXAM: CT CHEST, ABDOMEN AND PELVIS WITHOUT CONTRAST TECHNIQUE: Multidetector CT imaging of the chest, abdomen and pelvis was performed following the standard protocol without IV contrast. RADIATION DOSE REDUCTION: This exam was performed according to the departmental dose-optimization program which includes automated exposure control, adjustment of the mA and/or kV according  to patient size and/or use of iterative reconstruction technique. COMPARISON:  No priors. FINDINGS: CT CHEST FINDINGS Cardiovascular: Heart size is normal. There is no significant pericardial fluid, thickening or pericardial calcification. There is aortic atherosclerosis, as well as atherosclerosis of the great vessels of the mediastinum and the coronary arteries, including calcified atherosclerotic plaque in the left  main, left anterior descending, left circumflex and right coronary arteries. No pathologically enlarged lymph nodes. Mediastinum/Nodes: No pathologically enlarged mediastinal or hilar lymph nodes. Please note that accurate exclusion of hilar adenopathy is limited on noncontrast CT scans. Esophagus is mildly patulous and thickened without a discrete esophageal mass confidently identified. No axillary lymphadenopathy. Lungs/Pleura: 3 mm right upper lobe pulmonary nodule (axial image 79 of series 5). 5 mm right lower lobe pulmonary nodule (axial image 91 of series 5). No larger more suspicious appearing pulmonary nodules or masses are noted. No acute consolidative airspace disease. No pleural effusions. Patchy areas of peripheral predominant ground-glass attenuation, septal thickening and mild subpleural reticulation are noted throughout the mid to lower lungs bilaterally. No honeycombing. Musculoskeletal: There are no aggressive appearing lytic or blastic lesions noted in the visualized portions of the skeleton. CT ABDOMEN PELVIS FINDINGS Hepatobiliary: Liver has a shrunken appearance and nodular contour, indicative of underlying cirrhosis. No definite discrete cystic or solid hepatic lesions are confidently identified on today's noncontrast CT examination. Calcified gallstones lying dependently in the gallbladder. Gallbladder is moderately distended. Accurate assessment for pericholecystic fluid and inflammation is limited by presence of perihepatic ascites. Pancreas: No definite pancreatic mass. Haziness in the peripancreatic fat. No well-defined peripancreatic fluid collections. Spleen: Spleen is enlarged measuring 14.8 x 9.0 x 14.4 cm (estimated splenic volume of 959 mL) . Adrenals/Urinary Tract: Unenhanced appearance of the kidneys and bilateral adrenal glands is normal. No hydroureteronephrosis. Urinary bladder is unremarkable in appearance. Stomach/Bowel: Unenhanced appearance of the stomach is unremarkable. No  pathologic dilatation of small bowel or colon. Numerous colonic diverticulae are noted, particularly in the sigmoid colon. No definite focal surrounding inflammatory changes to clearly indicate acute diverticulitis are noted at this time (assessment is limited by presence of small volume of ascites). Appendix is not confidently identified. Vascular/Lymphatic: Atherosclerotic calcifications throughout the abdominal aorta and pelvic vasculature. Portal vein is dilated measuring 16 mm in the porta hepatis. No definite lymphadenopathy noted in the abdomen or pelvis on today's noncontrast CT examination. Reproductive: Prostate gland and seminal vesicles are unremarkable in appearance. Other: Small volume of ascites.  No pneumoperitoneum. Musculoskeletal: There are no aggressive appearing lytic or blastic lesions noted in the visualized portions of the skeleton. IMPRESSION: 1. Cirrhosis with small volume of ascites. Dilated portal vein and splenomegaly indicative of portal hypertension. 2. Small pulmonary nodules measuring 5 mm or less in size in the right lung, nonspecific, but statistically likely benign. No follow-up needed if patient is low-risk (and has no known or suspected primary neoplasm). Non-contrast chest CT can be considered in 12 months if patient is high-risk. This recommendation follows the consensus statement: Guidelines for Management of Incidental Pulmonary Nodules Detected on CT Images: From the Fleischner Society 2017; Radiology 2017; 284:228-243. 3. No other definite signs of potential metastatic disease noted elsewhere in the chest, abdomen or pelvis on today's noncontrast CT examination. 4. Esophagus appears mildly patulous and diffusely thickened without discrete esophageal mass. Further evaluation with nonemergent endoscopy should be considered to better evaluate these findings in the near future. 5. Cholelithiasis without evidence of acute cholecystitis at this time. 6. Aortic atherosclerosis,  in addition to  left main and three-vessel coronary artery disease. Please note that although the presence of coronary artery calcium documents the presence of coronary artery disease, the severity of this disease and any potential stenosis cannot be assessed on this non-gated CT examination. Assessment for potential risk factor modification, dietary therapy or pharmacologic therapy may be warranted, if clinically indicated. 7. Additional incidental findings, as above. Electronically Signed   By: Vinnie Langton M.D.   On: 11/18/2021 09:35   CT L-SPINE NO CHARGE  Result Date: 11/18/2021 CLINICAL DATA:  71 year old male being evaluated for metastatic disease. Weakness. Unable to walk. EXAM: CT LUMBAR SPINE WITHOUT CONTRAST TECHNIQUE: Technique: Multiplanar CT images of the lumbar spine were reconstructed from contemporary CT of the Abdomen and Pelvis. RADIATION DOSE REDUCTION: This exam was performed according to the departmental dose-optimization program which includes automated exposure control, adjustment of the mA and/or kV according to patient size and/or use of iterative reconstruction technique. CONTRAST:  None COMPARISON:  Thoracic spine CT, CT Chest, Abdomen, and Pelvis today reported separately. FINDINGS: Segmentation: Normal, concordant with the thoracic spine numbering today. Alignment: Normal lumbar lordosis. No spondylolisthesis. Minimal levoconvex lumbar scoliosis. Vertebrae: Bone mineralization is within normal limits for age. No lumbar vertebral fracture or lesion identified. Visible sacrum and SI joints appear intact. Paraspinal and other soft tissues: Abdominal and pelvic viscera are detailed separately. Lumbar paraspinal soft tissues are within normal limits. Disc levels: Age-appropriate lumbar spine degeneration. Disc bulging and endplate spurring with mild if any associated lumbar spinal stenosis. IMPRESSION: 1. Age-appropriate noncontrast CT appearance of the Lumbar Spine. 2.  CT Chest,  Abdomen, and Pelvis today are reported separately. Electronically Signed   By: Genevie Ann M.D.   On: 11/18/2021 09:34   CT T-SPINE NO CHARGE  Result Date: 11/18/2021 CLINICAL DATA:  71 year old male being evaluated for metastatic disease. Weakness. Unable to walk. EXAM: CT THORACIC SPINE WITHOUT CONTRAST TECHNIQUE: Multiplanar CT images of the thoracic spine were reconstructed from contemporary CT of the Chest. RADIATION DOSE REDUCTION: This exam was performed according to the departmental dose-optimization program which includes automated exposure control, adjustment of the mA and/or kV according to patient size and/or use of iterative reconstruction technique. CONTRAST:  None COMPARISON:  CT cervical spine, CT Chest, Abdomen, and Pelvis today reported separately. FINDINGS: Limited cervical spine imaging:  Reported separately today. Thoracic spine segmentation:  Normal. Alignment: Normal for age thoracic kyphosis. No spondylolisthesis. No scoliosis. Vertebrae: Bone mineralization is within normal limits for age. No thoracic vertebral lesion by CT. No thoracic vertebral compression or fracture identified. Visible posterior ribs appear intact. Paraspinal and other soft tissues: Chest and abdominal viscera are reported separately. Thoracic paraspinal soft tissues are within normal limits. Disc levels: Age-appropriate thoracic spine degeneration. No CT evidence of thoracic spinal stenosis. Unremarkable noncontrast appearance of the thoracic spinal canal. IMPRESSION: 1. Normal for age noncontrast CT appearance of the Thoracic Spine. 2.  CT Chest, Abdomen, and Pelvis today are reported separately. Electronically Signed   By: Genevie Ann M.D.   On: 11/18/2021 09:30   CT HEAD WO CONTRAST (5MM)  Result Date: 11/18/2021 CLINICAL DATA:  71 year old male being evaluated for metastatic disease. Weakness. Unable to walk. EXAM: CT HEAD WITHOUT CONTRAST TECHNIQUE: Contiguous axial images were obtained from the base of the skull  through the vertex without intravenous contrast. RADIATION DOSE REDUCTION: This exam was performed according to the departmental dose-optimization program which includes automated exposure control, adjustment of the mA and/or kV according to patient size and/or use of  iterative reconstruction technique. COMPARISON:  None Available. FINDINGS: Brain: Fairly normal for age cerebral volume. No midline shift, ventriculomegaly, mass effect, evidence of mass lesion, intracranial hemorrhage or evidence of cortically based acute infarction. Gray-white matter differentiation is within normal limits throughout the brain. No encephalomalacia or cerebral edema identified. Vascular: Calcified atherosclerosis at the skull base. Skull: No acute or suspicious bone lesion identified. Sinuses/Orbits: Visualized paranasal sinuses and mastoids are well aerated. Trace bilateral mastoid air cell fluid. Small mucous retention cyst right maxillary sinus. Other: Negative visible nasopharynx. No suspicious orbit or scalp soft tissue finding. IMPRESSION: Negative for age noncontrast Head CT. Note that early metastatic disease to the brain is not excluded in the absence of intravenous contrast. Electronically Signed   By: Genevie Ann M.D.   On: 11/18/2021 09:26   CT CERVICAL SPINE WO CONTRAST  Result Date: 11/18/2021 CLINICAL DATA:  71 year old male being evaluated for metastatic disease. Weakness. Unable to walk. EXAM: CT CERVICAL SPINE WITHOUT CONTRAST TECHNIQUE: Multidetector CT imaging of the cervical spine was performed without intravenous contrast. Multiplanar CT image reconstructions were also generated. RADIATION DOSE REDUCTION: This exam was performed according to the departmental dose-optimization program which includes automated exposure control, adjustment of the mA and/or kV according to patient size and/or use of iterative reconstruction technique. COMPARISON:  Head CT reported separately. FINDINGS: Alignment: Mild straightening  of cervical lordosis. Cervicothoracic junction alignment is within normal limits. Bilateral posterior element alignment is within normal limits. Skull base and vertebrae: Bone mineralization is within normal limits for age. Visualized skull base is intact. No atlanto-occipital dissociation. No acute or suspicious osseous lesion identified in the cervical spine. Soft tissues and spinal canal: No prevertebral fluid or swelling. No visible canal hematoma. Negative noncontrast visible neck soft tissues except for calcified carotid atherosclerosis. Disc levels: Ordinary lower cervical disc and endplate degeneration, bulky at C6-C7, eccentric to the left, and associated with up to mild spinal stenosis at that level. But no other convincing cervical spinal stenosis by CT. There is associated moderate to severe left C7 proximal foraminal stenosis. Upper chest: Chest CT, thoracic spine CT reported separately. IMPRESSION: 1. No acute or metastatic process identified in the Cervical Spine by noncontrast CT. 2. Ordinary cervical spine degeneration at C6-C7 with suspected mild spinal stenosis and moderate to severe left C7 foraminal stenosis. Electronically Signed   By: Genevie Ann M.D.   On: 11/18/2021 09:24    DVT Prophylaxis -SCD /Heparin AM Labs Ordered, also please review Full Orders  Family Communication: Admission, patients condition and plan of care including tests being ordered have been discussed with the patient and ex-wife and son [name] who indicate understanding and agree with the plan   Condition  -stable  Roxan Hockey M.D on 11/18/2021 at 3:54 PM Go to [email or web address] -  for contact info  [facility] - Office  [phone]

## 2021-11-19 DIAGNOSIS — K810 Acute cholecystitis: Secondary | ICD-10-CM | POA: Diagnosis not present

## 2021-11-19 LAB — GLUCOSE, CAPILLARY
Glucose-Capillary: 82 mg/dL (ref 70–99)
Glucose-Capillary: 127 mg/dL — ABNORMAL HIGH (ref 70–99)
Glucose-Capillary: 158 mg/dL — ABNORMAL HIGH (ref 70–99)
Glucose-Capillary: 128 mg/dL — ABNORMAL HIGH (ref 70–99)
Glucose-Capillary: 161 mg/dL — ABNORMAL HIGH (ref 70–99)

## 2021-11-19 LAB — COMPREHENSIVE METABOLIC PANEL
ALT: 127 U/L — ABNORMAL HIGH (ref 0–44)
AST: 290 U/L — ABNORMAL HIGH (ref 15–41)
Albumin: 2.4 g/dL — ABNORMAL LOW (ref 3.5–5.0)
Alkaline Phosphatase: 49 U/L (ref 38–126)
Anion gap: 8 (ref 5–15)
BUN: 30 mg/dL — ABNORMAL HIGH (ref 8–23)
CO2: 24 mmol/L (ref 22–32)
Calcium: 8.3 mg/dL — ABNORMAL LOW (ref 8.9–10.3)
Chloride: 106 mmol/L (ref 98–111)
Creatinine, Ser: 1.6 mg/dL — ABNORMAL HIGH (ref 0.61–1.24)
GFR, Estimated: 46 mL/min — ABNORMAL LOW (ref >60)
Glucose, Bld: 44 mg/dL — CL (ref 70–99)
Potassium: 3.5 mmol/L (ref 3.5–5.1)
Sodium: 138 mmol/L (ref 135–145)
Total Bilirubin: 2.8 mg/dL — ABNORMAL HIGH (ref 0.3–1.2)
Total Protein: 5.3 g/dL — ABNORMAL LOW (ref 6.5–8.1)

## 2021-11-19 LAB — CBC
HCT: 33.9 % — ABNORMAL LOW (ref 39.0–52.0)
Hemoglobin: 11.5 g/dL — ABNORMAL LOW (ref 13.0–17.0)
MCH: 32.9 pg (ref 26.0–34.0)
MCHC: 33.9 g/dL (ref 30.0–36.0)
MCV: 96.9 fL (ref 80.0–100.0)
Platelets: 58 10*3/uL — ABNORMAL LOW (ref 150–400)
RBC: 3.5 MIL/uL — ABNORMAL LOW (ref 4.22–5.81)
RDW: 14.4 % (ref 11.5–15.5)
WBC: 6.3 10*3/uL (ref 4.0–10.5)
nRBC: 0 % (ref 0.0–0.2)

## 2021-11-19 LAB — CK: Total CK: 5817 U/L — ABNORMAL HIGH (ref 49–397)

## 2021-11-19 LAB — HIV ANTIBODY (ROUTINE TESTING W REFLEX): HIV Screen 4th Generation wRfx: NONREACTIVE

## 2021-11-19 LAB — AMMONIA: Ammonia: 49 umol/L — ABNORMAL HIGH (ref 9–35)

## 2021-11-19 MED ORDER — DEXTROSE-NACL 5-0.9 % IV SOLN: INTRAVENOUS | Status: DC

## 2021-11-19 MED ORDER — INSULIN ASPART 100 UNIT/ML IJ SOLN: 0.0000 [IU] | Freq: Every day | INTRAMUSCULAR | Status: DC

## 2021-11-19 MED ORDER — INSULIN ASPART 100 UNIT/ML IJ SOLN
0.0000 [IU] | Freq: Three times a day (TID) | INTRAMUSCULAR | Status: DC
  Administered 2021-11-19 – 2021-11-22 (×5): 1 [IU] via SUBCUTANEOUS

## 2021-11-19 MED ORDER — LACTULOSE 10 GM/15ML PO SOLN
10.0000 g | Freq: Every day | ORAL | Status: DC
  Administered 2021-11-19 – 2021-11-21 (×3): 10 g via ORAL
  Filled 2021-11-19 (×3): qty 30

## 2021-11-19 NOTE — Progress Notes (Signed)
Patient was given two juices for critical cbg of 44 per lab work. Will recheck cbg in an hour.

## 2021-11-19 NOTE — Progress Notes (Signed)
PROGRESS NOTE     Edward [name], is a 71 y.o. male, DOB - [birth year], GMW:[id number]  Admit date - 11/18/2021   Admitting Physician Edward Chalfin Denton Brick, MD  Outpatient Primary MD for the patient is Edward [name], [place]  LOS - 1  Chief Complaint  Patient presents with   Multiple complaints        Brief Narrative:  71 y.o. male with past medical history relevant for moderate thrombocytopenia, macrocytic anemia with superimposed iron deficiency, copper deficiency, CKD 3B, HLD, CAD, DM2 and HTN admitted with possible Calculus Cholecystitis with Elevated LFTs and Liver Cirrhosis  -Assessment and Plan: 1)Possible Acute Calculus Cholecystitis with Elevated LFTs and Liver Cirrhosis---currently no significant abd pain - CT abdomen and pelvis and right upper quadrant ultrasound consistent with gallstones and cholecystitis -Surgical consult appreciated -Gi consult requested -IV Rocephin and Flagyl empirically -As needed pain medication and antiemetics HIDA scan pending -He may need a cholecystectomy tube by IR if HIDA scan suggest cystic duct obstruction   2)Liver Cirrhosis with Ascites and Portal Hypertension---??? Etiology -No significant alcohol use -INR 1.4, bilirubin is 3.1 -Random Urine Copper high at 72 (09/12/21) -PTA Patient was on atorvastatin and copper supplements -??? Wilson's disease in the setting of low copper and low ceruloplasmin.  Low ceruloplasmin may also be from copper deficiency-- may need ophthalmology for slit lamp exam to rule out [employer] rings. -Viral hepatitis profile was negative in 2021 -Ammonia is 49 -Daily lactulose as ordered - 3)History of copper deficiency--- has been on copper replacement for over 1 year -Random Urine Copper high at 72 (09/12/21) -Hold copper tablets as copper toxicity can lead to hepatic dysfunction   4)Elevated LFTs/elevated CPK -AST 288 >> 274 >>290 ALT 93 >> 120>>127 T. Bili 2.8  -INR 1.4 -CT abdomen and pelvis and  right upper quadrant ultrasound consistent with gallstone cholecystitis -Elevated CPK noted----Total CPK 6902>>5,817 -Elevated copper levels noted -Stop copper supplements and Stop atorvastatin   5)Anemia and Thrombocytopenia--- with macrocytosis --diagnosed in May 2021 -- SPEP in June 2021 was normal.  Erythropoietin normal at 11.9. -Previously declined bone marrow biopsy for evaluation of possible early MDS --Hgb 10.4>>11.5 - Platelets 52>>58 -Previously treated with copper supplements -Prior work-up revealed somewhat low iron --- B12 and Folate were Not low   6)AKI----acute kidney injury on CKD stage -3B ---Creatinine on admission was 2.02 -baseline---  1.5 to 1.6 -Creatinine is down to 1.60 -Suspect due to dehydration -renally adjust medications, avoid nephrotoxic agents / dehydration  / hypotension -Patient usually follows with Edward [name]   7)Hyponatremia------ suspect dehydration related,  -sodium 134>>138 --c/n IVF --avoid excessive free water   8)DM2---  A1c- 6.7 on 11/14/2021.[name]  Reflecting good diabetic control PTA Use Novolog/Humalog Sliding scale insulin with Accu-Cheks/Fingersticks as ordered    9)-Esophagitis--- CT chest shows -Patulous and thickened esophagus. -Protonix as ordered May need EGD in the future   10)Social/ethics--- Discussed with patient's ex-wife and son Edward [name] at bedside -Patient is a full code  11)HypoGlycemia----due to poor oral intake in setting of underlying liver cirrhosis  -feed liberally and frequently    Disposition/Need for in-Hospital Stay- patient unable to be discharged at this time due to -gallstones with cholecystitis and elevated LFTs with cirrhosis requiring IV antibiotics and further investigation*   Status is: Inpatient   Remains inpatient appropriate because:    Dispo: The patient is from: Home              Anticipated d/c is to: Home  Anticipated d/c date is: > 3 days              Patient currently is not  medically stable to d/c. Barriers: Not Clinically Stable-   Code Status :  -  Code Status: Full Code   Family Communication:    (patient is alert, awake and coherent)  Discussed with patient's ex- wife and patient's son Edward [name]  DVT Prophylaxis  :   - SCDs   heparin injection 5,000 Units Start: 11/18/21 1515 SCDs Start: 11/18/21 1420 Place TED hose Start: 11/18/21 1420   Lab Results  Component Value Date   PLT 58 (L) 11/19/2021    Inpatient Medications  Scheduled Meds:  aspirin  81 mg Oral Daily   feeding supplement  237 mL Oral BID BM   ferrous sulfate  325 mg Oral Q breakfast   heparin  5,000 Units Subcutaneous Q8H   insulin aspart  0-5 Units Subcutaneous QHS   insulin aspart  0-6 Units Subcutaneous TID WC   metoprolol tartrate  12.5 mg Oral BID   pantoprazole  40 mg Oral Daily   sodium bicarbonate  650 mg Oral TID   sodium chloride flush  3 mL Intravenous Q12H   sodium chloride flush  3 mL Intravenous Q12H   vitamin B-12  1,000 mcg Oral Daily   Continuous Infusions:  sodium chloride 150 mL/hr at 11/19/21 0906   sodium chloride     cefTRIAXone (ROCEPHIN)  IV 2 g (11/19/21 1432)   metronidazole 500 mg (11/19/21 1656)   PRN Meds:.sodium chloride, acetaminophen **OR** acetaminophen, bisacodyl, ondansetron **OR** ondansetron (ZOFRAN) IV, polyethylene glycol, sodium chloride flush, traZODone   Anti-infectives (From admission, onward)    Start     Dose/Rate Route Frequency Ordered Stop   11/18/21 1600  metroNIDAZOLE (FLAGYL) IVPB 500 mg        500 mg 100 mL/hr over 60 Minutes Intravenous Every 12 hours 11/18/21 1430     11/18/21 1530  cefTRIAXone (ROCEPHIN) 2 g in sodium chloride 0.9 % 100 mL IVPB        2 g 200 mL/hr over 30 Minutes Intravenous Every 24 hours 11/18/21 1430           Subjective: Edward [name] today has no fevers, no emesis,  No chest pain,   -Episode of hypoglycemia -Appetite is not great   Objective: Vitals:   11/19/21 0438 11/19/21 0500  11/19/21 0900 11/19/21 1456  BP: (!) 126/56  138/75 118/62  Pulse: 79  84 74  Resp: 20   16  Temp: 98 F (36.7 C)   98.6 F (37 C)  TempSrc: Oral   Oral  SpO2: 96%   97%  Weight:  74.6 kg    Height:  5' 10"  (1.778 m)      Intake/Output Summary (Last 24 hours) at 11/19/2021 1802 Last data filed at 11/19/2021 0659 Gross per 24 hour  Intake 1895.42 ml  Output 200 ml  Net 1695.42 ml   Filed Weights   11/18/21 0544 11/18/21 1325 11/19/21 0500  Weight: 69.9 kg 72.5 kg 74.6 kg    Physical Exam  Gen:- Awake Alert,  in no apparent distress  HEENT:- [place].AT, mild sclera icterus Neck-Supple Neck,No JVD,.  Lungs-  CTAB , fair symmetrical air movement CV- S1, S2 normal, regular  Abd-  +ve B.Sounds, soft, ND,+BS, right upper quadrant discomfort on palpation without definite Murphy sign, no rebound or guarding Extremity/Skin:- No  edema, pedal pulses present  Psych-affect is appropriate, oriented x3  Neuro-generalized weakness, no new focal deficits, no tremors  Data Reviewed: I have personally reviewed following labs and imaging studies  CBC: Recent Labs  Lab 11/14/21 0820 11/18/21 0644 11/19/21 0451  WBC 6.2 5.1 6.3  NEUTROABS 4.5  --   --   HGB 12.2* 10.4* 11.5*  HCT 35.5* 30.3* 33.9*  MCV 96 97.1 96.9  PLT 61* 52* 58*   Basic Metabolic Panel: Recent Labs  Lab 11/14/21 0820 11/18/21 0644 11/19/21 0451  NA 137 134* 138  K 4.0 3.7 3.5  CL 98 99 106  CO2 22 25 24   GLUCOSE 57* 79 44*  BUN 18 34* 30*  CREATININE 1.58* 2.02* 1.60*  CALCIUM 9.1 8.2* 8.3*  MG  --  2.1  --    GFR: Estimated Creatinine Clearance: 45 mL/min (A) (by C-G formula based on SCr of 1.6 mg/dL (H)). Liver Function Tests: Recent Labs  Lab 11/14/21 0820 11/18/21 0644 11/19/21 0451  AST 288* 274* 290*  ALT 93* 120* 127*  ALKPHOS 65 50 49  BILITOT 2.8* 3.1* 2.8*  PROT 6.0 5.3* 5.3*  ALBUMIN 3.4* 2.4* 2.4*   Cardiac Enzymes: Recent Labs  Lab 11/18/21 0644 11/19/21 0451  CKTOTAL 6,902*  5,817*    Radiology Studies: US Abdomen Limited RUQ (LIVER/GB)  Result Date: 11/18/2021 CLINICAL DATA:  Abnormal LFTs. EXAM: ULTRASOUND ABDOMEN LIMITED RIGHT UPPER QUADRANT COMPARISON:  CT of the abdomen and pelvis 11/18/2021 FINDINGS: Gallbladder: Gallbladder wall is thickened measuring 5.3 mm. No sonographic Percell Miller sign is described. Shadowing gallstones measure up to 11 mm. Common bile duct: Diameter: 3.0 mm, within normal limits. Liver: Liver is shrunken within nodular appearance. Heterogeneous increased echogenicity noted. No discrete lesions are present. Portal vein is patent on color Doppler imaging with normal direction of blood flow towards the liver. Other: Moderate abdominal ascites is present. IMPRESSION: 1. Gallbladder wall thickening with cholelithiasis suggesting acute cholecystitis. 2. Findings suggestive of hepatic cirrhosis. 3. Ascites. Electronically Signed   By: San Morelle M.D.   On: 11/18/2021 12:01   CT CHEST ABDOMEN PELVIS WO CONTRAST  Result Date: 11/18/2021 CLINICAL DATA:  71 year old male history of weakness. Metastatic disease. EXAM: CT CHEST, ABDOMEN AND PELVIS WITHOUT CONTRAST TECHNIQUE: Multidetector CT imaging of the chest, abdomen and pelvis was performed following the standard protocol without IV contrast. RADIATION DOSE REDUCTION: This exam was performed according to the departmental dose-optimization program which includes automated exposure control, adjustment of the mA and/or kV according to patient size and/or use of iterative reconstruction technique. COMPARISON:  No priors. FINDINGS: CT CHEST FINDINGS Cardiovascular: Heart size is normal. There is no significant pericardial fluid, thickening or pericardial calcification. There is aortic atherosclerosis, as well as atherosclerosis of the great vessels of the mediastinum and the coronary arteries, including calcified atherosclerotic plaque in the left main, left anterior descending, left circumflex and right  coronary arteries. No pathologically enlarged lymph nodes. Mediastinum/Nodes: No pathologically enlarged mediastinal or hilar lymph nodes. Please note that accurate exclusion of hilar adenopathy is limited on noncontrast CT scans. Esophagus is mildly patulous and thickened without a discrete esophageal mass confidently identified. No axillary lymphadenopathy. Lungs/Pleura: 3 mm right upper lobe pulmonary nodule (axial image 79 of series 5). 5 mm right lower lobe pulmonary nodule (axial image 91 of series 5). No larger more suspicious appearing pulmonary nodules or masses are noted. No acute consolidative airspace disease. No pleural effusions. Patchy areas of peripheral predominant ground-glass attenuation, septal thickening and mild subpleural reticulation are noted throughout the mid to lower lungs  bilaterally. No honeycombing. Musculoskeletal: There are no aggressive appearing lytic or blastic lesions noted in the visualized portions of the skeleton. CT ABDOMEN PELVIS FINDINGS Hepatobiliary: Liver has a shrunken appearance and nodular contour, indicative of underlying cirrhosis. No definite discrete cystic or solid hepatic lesions are confidently identified on today's noncontrast CT examination. Calcified gallstones lying dependently in the gallbladder. Gallbladder is moderately distended. Accurate assessment for pericholecystic fluid and inflammation is limited by presence of perihepatic ascites. Pancreas: No definite pancreatic mass. Haziness in the peripancreatic fat. No well-defined peripancreatic fluid collections. Spleen: Spleen is enlarged measuring 14.8 x 9.0 x 14.4 cm (estimated splenic volume of 959 mL) . Adrenals/Urinary Tract: Unenhanced appearance of the kidneys and bilateral adrenal glands is normal. No hydroureteronephrosis. Urinary bladder is unremarkable in appearance. Stomach/Bowel: Unenhanced appearance of the stomach is unremarkable. No pathologic dilatation of small bowel or colon. Numerous  colonic diverticulae are noted, particularly in the sigmoid colon. No definite focal surrounding inflammatory changes to clearly indicate acute diverticulitis are noted at this time (assessment is limited by presence of small volume of ascites). Appendix is not confidently identified. Vascular/Lymphatic: Atherosclerotic calcifications throughout the abdominal aorta and pelvic vasculature. Portal vein is dilated measuring 16 mm in the porta hepatis. No definite lymphadenopathy noted in the abdomen or pelvis on today's noncontrast CT examination. Reproductive: Prostate gland and seminal vesicles are unremarkable in appearance. Other: Small volume of ascites.  No pneumoperitoneum. Musculoskeletal: There are no aggressive appearing lytic or blastic lesions noted in the visualized portions of the skeleton. IMPRESSION: 1. Cirrhosis with small volume of ascites. Dilated portal vein and splenomegaly indicative of portal hypertension. 2. Small pulmonary nodules measuring 5 mm or less in size in the right lung, nonspecific, but statistically likely benign. No follow-up needed if patient is low-risk (and has no known or suspected primary neoplasm). Non-contrast chest CT can be considered in 12 months if patient is high-risk. This recommendation follows the consensus statement: Guidelines for Management of Incidental Pulmonary Nodules Detected on CT Images: From the Fleischner Society 2017; Radiology 2017; 284:228-243. 3. No other definite signs of potential metastatic disease noted elsewhere in the chest, abdomen or pelvis on today's noncontrast CT examination. 4. Esophagus appears mildly patulous and diffusely thickened without discrete esophageal mass. Further evaluation with nonemergent endoscopy should be considered to better evaluate these findings in the near future. 5. Cholelithiasis without evidence of acute cholecystitis at this time. 6. Aortic atherosclerosis, in addition to left main and three-vessel coronary artery  disease. Please note that although the presence of coronary artery calcium documents the presence of coronary artery disease, the severity of this disease and any potential stenosis cannot be assessed on this non-gated CT examination. Assessment for potential risk factor modification, dietary therapy or pharmacologic therapy may be warranted, if clinically indicated. 7. Additional incidental findings, as above. Electronically Signed   By: Vinnie Langton M.D.   On: 11/18/2021 09:35   CT L-SPINE NO CHARGE  Result Date: 11/18/2021 CLINICAL DATA:  71 year old male being evaluated for metastatic disease. Weakness. Unable to walk. EXAM: CT LUMBAR SPINE WITHOUT CONTRAST TECHNIQUE: Technique: Multiplanar CT images of the lumbar spine were reconstructed from contemporary CT of the Abdomen and Pelvis. RADIATION DOSE REDUCTION: This exam was performed according to the departmental dose-optimization program which includes automated exposure control, adjustment of the mA and/or kV according to patient size and/or use of iterative reconstruction technique. CONTRAST:  None COMPARISON:  Thoracic spine CT, CT Chest, Abdomen, and Pelvis today reported separately. FINDINGS: Segmentation: Normal,  concordant with the thoracic spine numbering today. Alignment: Normal lumbar lordosis. No spondylolisthesis. Minimal levoconvex lumbar scoliosis. Vertebrae: Bone mineralization is within normal limits for age. No lumbar vertebral fracture or lesion identified. Visible sacrum and SI joints appear intact. Paraspinal and other soft tissues: Abdominal and pelvic viscera are detailed separately. Lumbar paraspinal soft tissues are within normal limits. Disc levels: Age-appropriate lumbar spine degeneration. Disc bulging and endplate spurring with mild if any associated lumbar spinal stenosis. IMPRESSION: 1. Age-appropriate noncontrast CT appearance of the Lumbar Spine. 2.  CT Chest, Abdomen, and Pelvis today are reported separately.  Electronically Signed   By: Genevie Ann M.D.   On: 11/18/2021 09:34   CT T-SPINE NO CHARGE  Result Date: 11/18/2021 CLINICAL DATA:  71 year old male being evaluated for metastatic disease. Weakness. Unable to walk. EXAM: CT THORACIC SPINE WITHOUT CONTRAST TECHNIQUE: Multiplanar CT images of the thoracic spine were reconstructed from contemporary CT of the Chest. RADIATION DOSE REDUCTION: This exam was performed according to the departmental dose-optimization program which includes automated exposure control, adjustment of the mA and/or kV according to patient size and/or use of iterative reconstruction technique. CONTRAST:  None COMPARISON:  CT cervical spine, CT Chest, Abdomen, and Pelvis today reported separately. FINDINGS: Limited cervical spine imaging:  Reported separately today. Thoracic spine segmentation:  Normal. Alignment: Normal for age thoracic kyphosis. No spondylolisthesis. No scoliosis. Vertebrae: Bone mineralization is within normal limits for age. No thoracic vertebral lesion by CT. No thoracic vertebral compression or fracture identified. Visible posterior ribs appear intact. Paraspinal and other soft tissues: Chest and abdominal viscera are reported separately. Thoracic paraspinal soft tissues are within normal limits. Disc levels: Age-appropriate thoracic spine degeneration. No CT evidence of thoracic spinal stenosis. Unremarkable noncontrast appearance of the thoracic spinal canal. IMPRESSION: 1. Normal for age noncontrast CT appearance of the Thoracic Spine. 2.  CT Chest, Abdomen, and Pelvis today are reported separately. Electronically Signed   By: Genevie Ann M.D.   On: 11/18/2021 09:30   CT HEAD WO CONTRAST (5MM)  Result Date: 11/18/2021 CLINICAL DATA:  71 year old male being evaluated for metastatic disease. Weakness. Unable to walk. EXAM: CT HEAD WITHOUT CONTRAST TECHNIQUE: Contiguous axial images were obtained from the base of the skull through the vertex without intravenous contrast.  RADIATION DOSE REDUCTION: This exam was performed according to the departmental dose-optimization program which includes automated exposure control, adjustment of the mA and/or kV according to patient size and/or use of iterative reconstruction technique. COMPARISON:  None Available. FINDINGS: Brain: Fairly normal for age cerebral volume. No midline shift, ventriculomegaly, mass effect, evidence of mass lesion, intracranial hemorrhage or evidence of cortically based acute infarction. Gray-white matter differentiation is within normal limits throughout the brain. No encephalomalacia or cerebral edema identified. Vascular: Calcified atherosclerosis at the skull base. Skull: No acute or suspicious bone lesion identified. Sinuses/Orbits: Visualized paranasal sinuses and mastoids are well aerated. Trace bilateral mastoid air cell fluid. Small mucous retention cyst right maxillary sinus. Other: Negative visible nasopharynx. No suspicious orbit or scalp soft tissue finding. IMPRESSION: Negative for age noncontrast Head CT. Note that early metastatic disease to the brain is not excluded in the absence of intravenous contrast. Electronically Signed   By: Genevie Ann M.D.   On: 11/18/2021 09:26   CT CERVICAL SPINE WO CONTRAST  Result Date: 11/18/2021 CLINICAL DATA:  71 year old male being evaluated for metastatic disease. Weakness. Unable to walk. EXAM: CT CERVICAL SPINE WITHOUT CONTRAST TECHNIQUE: Multidetector CT imaging of the cervical spine was performed without intravenous  contrast. Multiplanar CT image reconstructions were also generated. RADIATION DOSE REDUCTION: This exam was performed according to the departmental dose-optimization program which includes automated exposure control, adjustment of the mA and/or kV according to patient size and/or use of iterative reconstruction technique. COMPARISON:  Head CT reported separately. FINDINGS: Alignment: Mild straightening of cervical lordosis. Cervicothoracic junction  alignment is within normal limits. Bilateral posterior element alignment is within normal limits. Skull base and vertebrae: Bone mineralization is within normal limits for age. Visualized skull base is intact. No atlanto-occipital dissociation. No acute or suspicious osseous lesion identified in the cervical spine. Soft tissues and spinal canal: No prevertebral fluid or swelling. No visible canal hematoma. Negative noncontrast visible neck soft tissues except for calcified carotid atherosclerosis. Disc levels: Ordinary lower cervical disc and endplate degeneration, bulky at C6-C7, eccentric to the left, and associated with up to mild spinal stenosis at that level. But no other convincing cervical spinal stenosis by CT. There is associated moderate to severe left C7 proximal foraminal stenosis. Upper chest: Chest CT, thoracic spine CT reported separately. IMPRESSION: 1. No acute or metastatic process identified in the Cervical Spine by noncontrast CT. 2. Ordinary cervical spine degeneration at C6-C7 with suspected mild spinal stenosis and moderate to severe left C7 foraminal stenosis. Electronically Signed   By: Genevie Ann M.D.   On: 11/18/2021 09:24     Scheduled Meds:  aspirin  81 mg Oral Daily   feeding supplement  237 mL Oral BID BM   ferrous sulfate  325 mg Oral Q breakfast   heparin  5,000 Units Subcutaneous Q8H   insulin aspart  0-5 Units Subcutaneous QHS   insulin aspart  0-6 Units Subcutaneous TID WC   metoprolol tartrate  12.5 mg Oral BID   pantoprazole  40 mg Oral Daily   sodium bicarbonate  650 mg Oral TID   sodium chloride flush  3 mL Intravenous Q12H   sodium chloride flush  3 mL Intravenous Q12H   vitamin B-12  1,000 mcg Oral Daily   Continuous Infusions:  sodium chloride 150 mL/hr at 11/19/21 0906   sodium chloride     cefTRIAXone (ROCEPHIN)  IV 2 g (11/19/21 1432)   metronidazole 500 mg (11/19/21 1656)    LOS: 1 day   Roxan Hockey M.D on 11/19/2021 at 6:02 PM  Go to  [email or web address] - for contact info  [facility] - Office  [phone]  If 7PM-7AM, please contact night-coverage [email or web address] 11/19/2021, 6:02 PM

## 2021-11-19 NOTE — Progress Notes (Signed)
   11/19/21 0730  Provider Notification  Provider Name/Title Dr. Joesph Fillers  Date Provider Notified 11/19/21  Time Provider Notified 0730  Method of Notification Page  Notification Reason Critical result  Test performed and critical result Glucose 44  Date Critical Result Received 11/19/21  Time Critical Result Received [phone]

## 2021-11-19 NOTE — Consult Note (Signed)
[facility] Consult  Reason for Consult: Concern for acute cholecystitis Referring Physician: Dr. Denton Brick  Chief Complaint   Multiple complaints     HPI: Edward [name] is a 71 y.o. male who was admitted to the hospital with back pain, weakness, difficulty walking, and elevated LFTs.  He underwent a significant work-up in the emergency department, including a CT of the abdomen pelvis.  This demonstrated gallstones, cirrhosis, portal venous hypertension, mild ascites, and splenomegaly.  He subsequently underwent a right upper quadrant ultrasound which demonstrated cholelithiasis, gallbladder wall thickening, negative Murphy's, and concern for cholecystitis.  He has no leukocytosis, normal alkaline phosphatase, T. bili 3.1, AST 274, ALT 120.  He has been hemodynamically stable.  Patient denies any abdominal pain at this time.  He denies any nausea or vomiting with eating.  He also denies abdominal pain after eating.  He has never had any surgeries on his abdomen.  He believes he might of been told he had liver disease in the past, but he does not believe he was taking any medications for this.  He denies use of blood thinning medications.  He denies use of tobacco products, alcohol, and illicit drugs.  Past Medical History:  Diagnosis Date   Cataract    CKD (chronic kidney disease) stage 3, GFR 30-59 ml/min (HCC)    Coronary atherosclerosis of native coronary artery    BMS LAD 2006; residual 75% distal RCA; EF 50%   Essential hypertension    Glaucoma    Low back pain    MI (myocardial infarction) ([place])    Anterior 2006   Mixed hyperlipidemia    Renal insufficiency    Type 2 diabetes mellitus ([place])     Past Surgical History:  Procedure Laterality Date   CATARACT EXTRACTION, BILATERAL     CORONARY ANGIOPLASTY WITH STENT PLACEMENT  2006    Family History  Problem Relation Age of Onset   Aneurysm Father        Brain aneurysm   Lung cancer Mother        Small cell  carcinoma of lung,kidney and heart   Heart disease Mother    Cancer Brother        lungs   Diabetes Sister    Diabetes Sister    Diabetes Brother    Diabetes Brother     Social History   Tobacco Use   Smoking status: Former    Packs/day: 2.00    Years: 20.00    Total pack years: 40.00    Types: Cigarettes    Quit date: 05/08/1987    Years since quitting: 34.5   Smokeless tobacco: Never  Vaping Use   Vaping Use: Never used  Substance Use Topics   Alcohol use: No    Alcohol/week: 0.0 standard drinks of alcohol   Drug use: No    Medications: I have reviewed the patient's current medications.  No Known Allergies   ROS:  Constitutional: positive for fatigue, negative for chills and fevers Respiratory: negative for shortness of breath Cardiovascular: negative for chest pain Gastrointestinal: negative for abdominal pain, nausea, and vomiting Musculoskeletal:positive for back pain  Blood pressure 138/75, pulse 84, temperature 98 F (36.7 C), temperature source Oral, resp. rate 20, height '5\' 10"'$  (1.778 m), weight 74.6 kg, SpO2 96 %. Physical Exam Vitals reviewed.  Constitutional:      Appearance: Normal appearance.  HENT:     Head: Normocephalic and atraumatic.  Eyes:     Extraocular Movements: Extraocular movements intact.  Pupils: Pupils are equal, round, and reactive to light.  Cardiovascular:     Rate and Rhythm: Normal rate.  Pulmonary:     Effort: Pulmonary effort is normal.  Abdominal:     Comments: Abdomen soft, distended, no percussion tenderness, nontender to palpation; no rigidity, guarding, rebound tenderness; negative Murphy's  Musculoskeletal:     Cervical back: Normal range of motion.  Skin:    General: Skin is warm and dry.  Neurological:     General: No focal deficit present.     Mental Status: He is alert and oriented to person, place, and time.  Psychiatric:        Mood and Affect: Mood normal.        Behavior: Behavior normal.      Results: Results for orders placed or performed during the hospital encounter of 11/18/21 (from the past 48 hour(s))  CBC     Status: Abnormal   Collection Time: 11/18/21  6:44 AM  Result Value Ref Range   WBC 5.1 4.0 - 10.5 K/uL   RBC 3.12 (L) 4.22 - 5.81 MIL/uL   Hemoglobin 10.4 (L) 13.0 - 17.0 g/dL   HCT 30.3 (L) 39.0 - 52.0 %   MCV 97.1 80.0 - 100.0 fL   MCH 33.3 26.0 - 34.0 pg   MCHC 34.3 30.0 - 36.0 g/dL   RDW 14.4 11.5 - 15.5 %   Platelets 52 (L) 150 - 400 K/uL    Comment: Immature Platelet Fraction may be clinically indicated, consider ordering this additional test [id number]    nRBC 0.0 0.0 - 0.2 %    Comment: Performed at [facility], [address], [place], [place] [address]  Comprehensive metabolic panel     Status: Abnormal   Collection Time: 11/18/21  6:44 AM  Result Value Ref Range   Sodium 134 (L) 135 - 145 mmol/L   Potassium 3.7 3.5 - 5.1 mmol/L   Chloride 99 98 - 111 mmol/L   CO2 25 22 - 32 mmol/L   Glucose, Bld 79 70 - 99 mg/dL    Comment: Glucose reference range applies only to samples taken after fasting for at least 8 hours.   BUN 34 (H) 8 - 23 mg/dL   Creatinine, Ser 2.02 (H) 0.61 - 1.24 mg/dL   Calcium 8.2 (L) 8.9 - 10.3 mg/dL   Total Protein 5.3 (L) 6.5 - 8.1 g/dL   Albumin 2.4 (L) 3.5 - 5.0 g/dL   AST 274 (H) 15 - 41 U/L   ALT 120 (H) 0 - 44 U/L   Alkaline Phosphatase 50 38 - 126 U/L   Total Bilirubin 3.1 (H) 0.3 - 1.2 mg/dL   GFR, Estimated 35 (L) >60 mL/min    Comment: (NOTE) Calculated using the CKD-EPI Creatinine Equation (2021)    Anion gap 10 5 - 15    Comment: Performed at [facility], [address], [place], [place] [address]  CK     Status: Abnormal   Collection Time: 11/18/21  6:44 AM  Result Value Ref Range   Total CK 6,902 (H) 49 - 397 U/L    Comment: RESULTS CONFIRMED BY MANUAL DILUTION Performed at [facility], [address] [place], [place] [address]   Lactic acid, plasma     Status: None   Collection Time:  11/18/21  6:44 AM  Result Value Ref Range   Lactic Acid, Venous 1.9 0.5 - 1.9 mmol/L    Comment: Performed at [facility], [address] [place],  [place] [address]  Protime-INR     Status: Abnormal   Collection Time: 11/18/21  6:44 AM  Result Value Ref Range   Prothrombin Time 17.4 (H) 11.4 - 15.2 seconds   INR 1.4 (H) 0.8 - 1.2    Comment: (NOTE) INR goal varies based on device and disease states. Performed at [facility], [address], [place], [place] [address]   Magnesium     Status: None   Collection Time: 11/18/21  6:44 AM  Result Value Ref Range   Magnesium 2.1 1.7 - 2.4 mg/dL    Comment: Performed at [facility], [address] [place], [place] [address]  Urinalysis, Routine w reflex microscopic Urine, Clean Catch     Status: Abnormal   Collection Time: 11/18/21  8:55 AM  Result Value Ref Range   Color, Urine YELLOW YELLOW   APPearance CLEAR CLEAR   Specific Gravity, Urine 1.003 (L) 1.005 - 1.030   pH 6.0 5.0 - 8.0   Glucose, UA NEGATIVE NEGATIVE mg/dL   Hgb urine dipstick LARGE (A) NEGATIVE   Bilirubin Urine NEGATIVE NEGATIVE   Ketones, ur NEGATIVE NEGATIVE mg/dL   Protein, ur NEGATIVE NEGATIVE mg/dL   Nitrite NEGATIVE NEGATIVE   Leukocytes,Ua NEGATIVE NEGATIVE   RBC / HPF 0-5 0 - 5 RBC/hpf   WBC, UA 0-5 0 - 5 WBC/hpf   Bacteria, UA NONE SEEN NONE SEEN    Comment: Performed at [facility], [address] [place], [place] [address]  Glucose, capillary     Status: Abnormal   Collection Time: 11/18/21  4:45 PM  Result Value Ref Range   Glucose-Capillary 111 (H) 70 - 99 mg/dL    Comment: Glucose reference range applies only to samples taken after fasting for at least 8 hours.  Glucose, capillary     Status: Abnormal   Collection Time: 11/18/21  9:00 PM  Result Value Ref Range   Glucose-Capillary 105 (H) 70 - 99 mg/dL    Comment: Glucose reference range applies only to samples taken after fasting for at least 8 hours.  Comprehensive metabolic panel     Status:  Abnormal   Collection Time: 11/19/21  4:51 AM  Result Value Ref Range   Sodium 138 135 - 145 mmol/L   Potassium 3.5 3.5 - 5.1 mmol/L   Chloride 106 98 - 111 mmol/L   CO2 24 22 - 32 mmol/L   Glucose, Bld 44 (LL) 70 - 99 mg/dL    Comment: Glucose reference range applies only to samples taken after fasting for at least 8 hours. CRITICAL RESULT CALLED TO, READ BACK BY AND VERIFIED WITH:  S. STRADARD @ 0700 BY STEPHTR    BUN 30 (H) 8 - 23 mg/dL   Creatinine, Ser 1.60 (H) 0.61 - 1.24 mg/dL   Calcium 8.3 (L) 8.9 - 10.3 mg/dL   Total Protein 5.3 (L) 6.5 - 8.1 g/dL   Albumin 2.4 (L) 3.5 - 5.0 g/dL   AST 290 (H) 15 - 41 U/L   ALT 127 (H) 0 - 44 U/L   Alkaline Phosphatase 49 38 - 126 U/L   Total Bilirubin 2.8 (H) 0.3 - 1.2 mg/dL   GFR, Estimated 46 (L) >60 mL/min    Comment: (NOTE) Calculated using the CKD-EPI Creatinine Equation (2021)    Anion gap 8 5 - 15    Comment: Performed at [facility], [address] [place], [place] [address]  CK     Status: Abnormal   Collection Time: 11/19/21  4:51  AM  Result Value Ref Range   Total CK 5,817 (H) 49 - 397 U/L    Comment: RESULTS CONFIRMED BY MANUAL DILUTION Performed at [facility], [address], [place], [place] [address]   CBC     Status: Abnormal   Collection Time: 11/19/21  4:51 AM  Result Value Ref Range   WBC 6.3 4.0 - 10.5 K/uL   RBC 3.50 (L) 4.22 - 5.81 MIL/uL   Hemoglobin 11.5 (L) 13.0 - 17.0 g/dL   HCT 33.9 (L) 39.0 - 52.0 %   MCV 96.9 80.0 - 100.0 fL   MCH 32.9 26.0 - 34.0 pg   MCHC 33.9 30.0 - 36.0 g/dL   RDW 14.4 11.5 - 15.5 %   Platelets 58 (L) 150 - 400 K/uL    Comment: SPECIMEN CHECKED FOR CLOTS Immature Platelet Fraction may be clinically indicated, consider ordering this additional test [id number] REPEATED TO VERIFY PLATELET COUNT CONFIRMED BY SMEAR    nRBC 0.0 0.0 - 0.2 %    Comment: Performed at [facility], [address], [place], [place] [address]  Ammonia     Status: Abnormal   Collection Time:  11/19/21  4:51 AM  Result Value Ref Range   Ammonia 49 (H) 9 - 35 umol/L    Comment: Performed at [facility], [address] [place], [place] [address]  Glucose, capillary     Status: None   Collection Time: 11/19/21  7:05 AM  Result Value Ref Range   Glucose-Capillary 82 70 - 99 mg/dL    Comment: Glucose reference range applies only to samples taken after fasting for at least 8 hours.  Glucose, capillary     Status: Abnormal   Collection Time: 11/19/21  8:10 AM  Result Value Ref Range   Glucose-Capillary 127 (H) 70 - 99 mg/dL    Comment: Glucose reference range applies only to samples taken after fasting for at least 8 hours.    US Abdomen Limited RUQ (LIVER/GB)  Result Date: 11/18/2021 CLINICAL DATA:  Abnormal LFTs. EXAM: ULTRASOUND ABDOMEN LIMITED RIGHT UPPER QUADRANT COMPARISON:  CT of the abdomen and pelvis 11/18/2021 FINDINGS: Gallbladder: Gallbladder wall is thickened measuring 5.3 mm. No sonographic Percell Miller sign is described. Shadowing gallstones measure up to 11 mm. Common bile duct: Diameter: 3.0 mm, within normal limits. Liver: Liver is shrunken within nodular appearance. Heterogeneous increased echogenicity noted. No discrete lesions are present. Portal vein is patent on color Doppler imaging with normal direction of blood flow towards the liver. Other: Moderate abdominal ascites is present. IMPRESSION: 1. Gallbladder wall thickening with cholelithiasis suggesting acute cholecystitis. 2. Findings suggestive of hepatic cirrhosis. 3. Ascites. Electronically Signed   By: San Morelle M.D.   On: 11/18/2021 12:01   CT CHEST ABDOMEN PELVIS WO CONTRAST  Result Date: 11/18/2021 CLINICAL DATA:  71 year old male history of weakness. Metastatic disease. EXAM: CT CHEST, ABDOMEN AND PELVIS WITHOUT CONTRAST TECHNIQUE: Multidetector CT imaging of the chest, abdomen and pelvis was performed following the standard protocol without IV contrast. RADIATION DOSE REDUCTION: This exam was  performed according to the departmental dose-optimization program which includes automated exposure control, adjustment of the mA and/or kV according to patient size and/or use of iterative reconstruction technique. COMPARISON:  No priors. FINDINGS: CT CHEST FINDINGS Cardiovascular: Heart size is normal. There is no significant pericardial fluid, thickening or pericardial calcification. There is aortic atherosclerosis, as well as atherosclerosis of the great vessels of the mediastinum and the coronary arteries, including calcified atherosclerotic plaque in the left  main, left anterior descending, left circumflex and right coronary arteries. No pathologically enlarged lymph nodes. Mediastinum/Nodes: No pathologically enlarged mediastinal or hilar lymph nodes. Please note that accurate exclusion of hilar adenopathy is limited on noncontrast CT scans. Esophagus is mildly patulous and thickened without a discrete esophageal mass confidently identified. No axillary lymphadenopathy. Lungs/Pleura: 3 mm right upper lobe pulmonary nodule (axial image 79 of series 5). 5 mm right lower lobe pulmonary nodule (axial image 91 of series 5). No larger more suspicious appearing pulmonary nodules or masses are noted. No acute consolidative airspace disease. No pleural effusions. Patchy areas of peripheral predominant ground-glass attenuation, septal thickening and mild subpleural reticulation are noted throughout the mid to lower lungs bilaterally. No honeycombing. Musculoskeletal: There are no aggressive appearing lytic or blastic lesions noted in the visualized portions of the skeleton. CT ABDOMEN PELVIS FINDINGS Hepatobiliary: Liver has a shrunken appearance and nodular contour, indicative of underlying cirrhosis. No definite discrete cystic or solid hepatic lesions are confidently identified on today's noncontrast CT examination. Calcified gallstones lying dependently in the gallbladder. Gallbladder is moderately distended.  Accurate assessment for pericholecystic fluid and inflammation is limited by presence of perihepatic ascites. Pancreas: No definite pancreatic mass. Haziness in the peripancreatic fat. No well-defined peripancreatic fluid collections. Spleen: Spleen is enlarged measuring 14.8 x 9.0 x 14.4 cm (estimated splenic volume of 959 mL) . Adrenals/Urinary Tract: Unenhanced appearance of the kidneys and bilateral adrenal glands is normal. No hydroureteronephrosis. Urinary bladder is unremarkable in appearance. Stomach/Bowel: Unenhanced appearance of the stomach is unremarkable. No pathologic dilatation of small bowel or colon. Numerous colonic diverticulae are noted, particularly in the sigmoid colon. No definite focal surrounding inflammatory changes to clearly indicate acute diverticulitis are noted at this time (assessment is limited by presence of small volume of ascites). Appendix is not confidently identified. Vascular/Lymphatic: Atherosclerotic calcifications throughout the abdominal aorta and pelvic vasculature. Portal vein is dilated measuring 16 mm in the porta hepatis. No definite lymphadenopathy noted in the abdomen or pelvis on today's noncontrast CT examination. Reproductive: Prostate gland and seminal vesicles are unremarkable in appearance. Other: Small volume of ascites.  No pneumoperitoneum. Musculoskeletal: There are no aggressive appearing lytic or blastic lesions noted in the visualized portions of the skeleton. IMPRESSION: 1. Cirrhosis with small volume of ascites. Dilated portal vein and splenomegaly indicative of portal hypertension. 2. Small pulmonary nodules measuring 5 mm or less in size in the right lung, nonspecific, but statistically likely benign. No follow-up needed if patient is low-risk (and has no known or suspected primary neoplasm). Non-contrast chest CT can be considered in 12 months if patient is high-risk. This recommendation follows the consensus statement: Guidelines for Management of  Incidental Pulmonary Nodules Detected on CT Images: From the Fleischner Society 2017; Radiology 2017; 284:228-243. 3. No other definite signs of potential metastatic disease noted elsewhere in the chest, abdomen or pelvis on today's noncontrast CT examination. 4. Esophagus appears mildly patulous and diffusely thickened without discrete esophageal mass. Further evaluation with nonemergent endoscopy should be considered to better evaluate these findings in the near future. 5. Cholelithiasis without evidence of acute cholecystitis at this time. 6. Aortic atherosclerosis, in addition to left main and three-vessel coronary artery disease. Please note that although the presence of coronary artery calcium documents the presence of coronary artery disease, the severity of this disease and any potential stenosis cannot be assessed on this non-gated CT examination. Assessment for potential risk factor modification, dietary therapy or pharmacologic therapy may be warranted, if clinically indicated. 7.  Additional incidental findings, as above. Electronically Signed   By: Vinnie Langton M.D.   On: 11/18/2021 09:35   CT L-SPINE NO CHARGE  Result Date: 11/18/2021 CLINICAL DATA:  71 year old male being evaluated for metastatic disease. Weakness. Unable to walk. EXAM: CT LUMBAR SPINE WITHOUT CONTRAST TECHNIQUE: Technique: Multiplanar CT images of the lumbar spine were reconstructed from contemporary CT of the Abdomen and Pelvis. RADIATION DOSE REDUCTION: This exam was performed according to the departmental dose-optimization program which includes automated exposure control, adjustment of the mA and/or kV according to patient size and/or use of iterative reconstruction technique. CONTRAST:  None COMPARISON:  Thoracic spine CT, CT Chest, Abdomen, and Pelvis today reported separately. FINDINGS: Segmentation: Normal, concordant with the thoracic spine numbering today. Alignment: Normal lumbar lordosis. No spondylolisthesis.  Minimal levoconvex lumbar scoliosis. Vertebrae: Bone mineralization is within normal limits for age. No lumbar vertebral fracture or lesion identified. Visible sacrum and SI joints appear intact. Paraspinal and other soft tissues: Abdominal and pelvic viscera are detailed separately. Lumbar paraspinal soft tissues are within normal limits. Disc levels: Age-appropriate lumbar spine degeneration. Disc bulging and endplate spurring with mild if any associated lumbar spinal stenosis. IMPRESSION: 1. Age-appropriate noncontrast CT appearance of the Lumbar Spine. 2.  CT Chest, Abdomen, and Pelvis today are reported separately. Electronically Signed   By: Genevie Ann M.D.   On: 11/18/2021 09:34   CT T-SPINE NO CHARGE  Result Date: 11/18/2021 CLINICAL DATA:  71 year old male being evaluated for metastatic disease. Weakness. Unable to walk. EXAM: CT THORACIC SPINE WITHOUT CONTRAST TECHNIQUE: Multiplanar CT images of the thoracic spine were reconstructed from contemporary CT of the Chest. RADIATION DOSE REDUCTION: This exam was performed according to the departmental dose-optimization program which includes automated exposure control, adjustment of the mA and/or kV according to patient size and/or use of iterative reconstruction technique. CONTRAST:  None COMPARISON:  CT cervical spine, CT Chest, Abdomen, and Pelvis today reported separately. FINDINGS: Limited cervical spine imaging:  Reported separately today. Thoracic spine segmentation:  Normal. Alignment: Normal for age thoracic kyphosis. No spondylolisthesis. No scoliosis. Vertebrae: Bone mineralization is within normal limits for age. No thoracic vertebral lesion by CT. No thoracic vertebral compression or fracture identified. Visible posterior ribs appear intact. Paraspinal and other soft tissues: Chest and abdominal viscera are reported separately. Thoracic paraspinal soft tissues are within normal limits. Disc levels: Age-appropriate thoracic spine degeneration. No CT  evidence of thoracic spinal stenosis. Unremarkable noncontrast appearance of the thoracic spinal canal. IMPRESSION: 1. Normal for age noncontrast CT appearance of the Thoracic Spine. 2.  CT Chest, Abdomen, and Pelvis today are reported separately. Electronically Signed   By: Genevie Ann M.D.   On: 11/18/2021 09:30   CT HEAD WO CONTRAST (5MM)  Result Date: 11/18/2021 CLINICAL DATA:  71 year old male being evaluated for metastatic disease. Weakness. Unable to walk. EXAM: CT HEAD WITHOUT CONTRAST TECHNIQUE: Contiguous axial images were obtained from the base of the skull through the vertex without intravenous contrast. RADIATION DOSE REDUCTION: This exam was performed according to the departmental dose-optimization program which includes automated exposure control, adjustment of the mA and/or kV according to patient size and/or use of iterative reconstruction technique. COMPARISON:  None Available. FINDINGS: Brain: Fairly normal for age cerebral volume. No midline shift, ventriculomegaly, mass effect, evidence of mass lesion, intracranial hemorrhage or evidence of cortically based acute infarction. Gray-white matter differentiation is within normal limits throughout the brain. No encephalomalacia or cerebral edema identified. Vascular: Calcified atherosclerosis at the skull base. Skull: No  acute or suspicious bone lesion identified. Sinuses/Orbits: Visualized paranasal sinuses and mastoids are well aerated. Trace bilateral mastoid air cell fluid. Small mucous retention cyst right maxillary sinus. Other: Negative visible nasopharynx. No suspicious orbit or scalp soft tissue finding. IMPRESSION: Negative for age noncontrast Head CT. Note that early metastatic disease to the brain is not excluded in the absence of intravenous contrast. Electronically Signed   By: Genevie Ann M.D.   On: 11/18/2021 09:26   CT CERVICAL SPINE WO CONTRAST  Result Date: 11/18/2021 CLINICAL DATA:  71 year old male being evaluated for metastatic  disease. Weakness. Unable to walk. EXAM: CT CERVICAL SPINE WITHOUT CONTRAST TECHNIQUE: Multidetector CT imaging of the cervical spine was performed without intravenous contrast. Multiplanar CT image reconstructions were also generated. RADIATION DOSE REDUCTION: This exam was performed according to the departmental dose-optimization program which includes automated exposure control, adjustment of the mA and/or kV according to patient size and/or use of iterative reconstruction technique. COMPARISON:  Head CT reported separately. FINDINGS: Alignment: Mild straightening of cervical lordosis. Cervicothoracic junction alignment is within normal limits. Bilateral posterior element alignment is within normal limits. Skull base and vertebrae: Bone mineralization is within normal limits for age. Visualized skull base is intact. No atlanto-occipital dissociation. No acute or suspicious osseous lesion identified in the cervical spine. Soft tissues and spinal canal: No prevertebral fluid or swelling. No visible canal hematoma. Negative noncontrast visible neck soft tissues except for calcified carotid atherosclerosis. Disc levels: Ordinary lower cervical disc and endplate degeneration, bulky at C6-C7, eccentric to the left, and associated with up to mild spinal stenosis at that level. But no other convincing cervical spinal stenosis by CT. There is associated moderate to severe left C7 proximal foraminal stenosis. Upper chest: Chest CT, thoracic spine CT reported separately. IMPRESSION: 1. No acute or metastatic process identified in the Cervical Spine by noncontrast CT. 2. Ordinary cervical spine degeneration at C6-C7 with suspected mild spinal stenosis and moderate to severe left C7 foraminal stenosis. Electronically Signed   By: Genevie Ann M.D.   On: 11/18/2021 09:24     Assessment & Plan:  Edward [name] is a 71 y.o. male who presented with weakness, difficulty walking, back pain, and elevated LFTs.  He was admitted with  concerns for acute calculus cholecystitis.  CT abdomen and pelvis demonstrates gallstones, cirrhosis, portal venous hypertension, mild ascites, and splenomegaly.  Abdominal ultrasound demonstrates cholelithiasis and gallbladder wall thickening, negative Murphy's, concern for cholecystitis.  Imaging and blood work evaluated by myself.  -Patient's presentation is not characteristic for any acute cholecystitis, with no abdominal pain, no postprandial abdominal pain, no nausea/vomiting, and no leukocytosis -Suspect that gallbladder wall thickening noted on abdominal ultrasound is secondary to underlying liver disease -LFTs also likely elevated secondary to underlying liver disease -ALP normal and no leukocytosis -Recommend HIDA scan to evaluate for cystic duct obstruction -If gallbladder not visualized on HIDA scan, recommend cholecystostomy tube placement by IR -Continue Rocephin and Flagyl until completion of HIDA scan -Patient is a Child-Pugh class C based on his blood work, which means his abdominal surgery perioperative mortality is >80%.  Would recommend against any surgery given his significant operative risk -Okay for diet from general surgery standpoint.  Will need to be NPO for HIDA scan -Care per primary team  All questions were answered to the satisfaction of the patient.   -- Graciella Freer, DO [facility] [address] [place], [place] [address] [phone] (office)

## 2021-11-20 ENCOUNTER — Inpatient Hospital Stay (HOSPITAL_COMMUNITY): Payer: 59

## 2021-11-20 DIAGNOSIS — K7460 Unspecified cirrhosis of liver: Secondary | ICD-10-CM | POA: Diagnosis not present

## 2021-11-20 DIAGNOSIS — R7989 Other specified abnormal findings of blood chemistry: Secondary | ICD-10-CM | POA: Diagnosis not present

## 2021-11-20 DIAGNOSIS — M6282 Rhabdomyolysis: Secondary | ICD-10-CM

## 2021-11-20 DIAGNOSIS — K219 Gastro-esophageal reflux disease without esophagitis: Secondary | ICD-10-CM | POA: Diagnosis not present

## 2021-11-20 DIAGNOSIS — K810 Acute cholecystitis: Secondary | ICD-10-CM | POA: Diagnosis not present

## 2021-11-20 LAB — COMPREHENSIVE METABOLIC PANEL
ALT: 127 U/L — ABNORMAL HIGH (ref 0–44)
AST: 260 U/L — ABNORMAL HIGH (ref 15–41)
Albumin: 2.2 g/dL — ABNORMAL LOW (ref 3.5–5.0)
Alkaline Phosphatase: 44 U/L (ref 38–126)
Anion gap: 7 (ref 5–15)
BUN: 31 mg/dL — ABNORMAL HIGH (ref 8–23)
CO2: 24 mmol/L (ref 22–32)
Calcium: 8.3 mg/dL — ABNORMAL LOW (ref 8.9–10.3)
Chloride: 109 mmol/L (ref 98–111)
Creatinine, Ser: 1.53 mg/dL — ABNORMAL HIGH (ref 0.61–1.24)
GFR, Estimated: 49 mL/min — ABNORMAL LOW (ref >60)
Glucose, Bld: 93 mg/dL (ref 70–99)
Potassium: 3.9 mmol/L (ref 3.5–5.1)
Sodium: 140 mmol/L (ref 135–145)
Total Bilirubin: 2.1 mg/dL — ABNORMAL HIGH (ref 0.3–1.2)
Total Protein: 5.1 g/dL — ABNORMAL LOW (ref 6.5–8.1)

## 2021-11-20 LAB — HEPATITIS B SURFACE ANTIGEN: Hepatitis B Surface Ag: NONREACTIVE

## 2021-11-20 LAB — CBC
HCT: 31.8 % — ABNORMAL LOW (ref 39.0–52.0)
Hemoglobin: 10.6 g/dL — ABNORMAL LOW (ref 13.0–17.0)
MCH: 32.9 pg (ref 26.0–34.0)
MCHC: 33.3 g/dL (ref 30.0–36.0)
MCV: 98.8 fL (ref 80.0–100.0)
Platelets: 57 10*3/uL — ABNORMAL LOW (ref 150–400)
RBC: 3.22 MIL/uL — ABNORMAL LOW (ref 4.22–5.81)
RDW: 14.6 % (ref 11.5–15.5)
WBC: 6 10*3/uL (ref 4.0–10.5)
nRBC: 0 % (ref 0.0–0.2)

## 2021-11-20 LAB — BILIRUBIN, FRACTIONATED(TOT/DIR/INDIR)
Bilirubin, Direct: 1 mg/dL — ABNORMAL HIGH (ref 0.0–0.2)
Indirect Bilirubin: 1.9 mg/dL — ABNORMAL HIGH (ref 0.3–0.9)
Total Bilirubin: 2.9 mg/dL — ABNORMAL HIGH (ref 0.3–1.2)

## 2021-11-20 LAB — PROTIME-INR
INR: 1.5 — ABNORMAL HIGH (ref 0.8–1.2)
Prothrombin Time: 18.2 seconds — ABNORMAL HIGH (ref 11.4–15.2)

## 2021-11-20 LAB — GLUCOSE, CAPILLARY
Glucose-Capillary: 100 mg/dL — ABNORMAL HIGH (ref 70–99)
Glucose-Capillary: 101 mg/dL — ABNORMAL HIGH (ref 70–99)
Glucose-Capillary: 141 mg/dL — ABNORMAL HIGH (ref 70–99)
Glucose-Capillary: 181 mg/dL — ABNORMAL HIGH (ref 70–99)

## 2021-11-20 LAB — HEPATITIS B CORE ANTIBODY, TOTAL: Hep B Core Total Ab: NONREACTIVE

## 2021-11-20 MED ORDER — SINCALIDE 5 MCG IJ SOLR
INTRAMUSCULAR | Status: AC
  Administered 2021-11-20: 1.5 ug
  Filled 2021-11-20: qty 5

## 2021-11-20 MED ORDER — STERILE WATER FOR INJECTION IJ SOLN
INTRAMUSCULAR | Status: AC
  Administered 2021-11-20: 1.5 mL
  Filled 2021-11-20: qty 10

## 2021-11-20 MED ORDER — TECHNETIUM TC 99M MEBROFENIN IV KIT
5.0000 | PACK | Freq: Once | INTRAVENOUS | Status: AC | PRN
  Administered 2021-11-20: 5.5 via INTRAVENOUS

## 2021-11-20 NOTE — Progress Notes (Signed)
[facility] Progress Note     Subjective: Patient seen and examined.  He is sitting comfortably in the chair.  He denies nausea or vomiting with his diet yesterday.  He denies significant abdominal pain, though he does complain that his abdomen feels tight.  Objective: Vital signs in last 24 hours: Temp:  [98.2 F (36.8 C)-98.6 F (37 C)] 98.2 F (36.8 C) (07/16 2038) Pulse Rate:  [74-80] 80 (07/16 2038) Resp:  [16-18] 18 (07/16 2038) BP: (118-137)/(62-69) 128/67 (07/17 0832) SpO2:  [96 %-97 %] 96 % (07/16 2038) Weight:  [76.5 kg] 76.5 kg (07/17 0552) Last BM Date : 11/21/21  Intake/Output from previous day: 07/16 0701 - 07/17 0700 In: 1958.8 [P.O.:720; I.V.:933.9; IV Piggyback:304.9] Out: -  Intake/Output this shift: No intake/output data recorded.  General appearance: alert, cooperative, and no distress GI: Abdomen soft, moderate distention, no percussion tenderness, nontender to palpation; no rigidity, guarding, or rebound tenderness; negative Murphy's  Lab Results:  Recent Labs    11/19/21 0451 11/20/21 0441  WBC 6.3 6.0  HGB 11.5* 10.6*  HCT 33.9* 31.8*  PLT 58* 57*   BMET Recent Labs    11/19/21 0451 11/20/21 0441  NA 138 140  K 3.5 3.9  CL 106 109  CO2 24 24  GLUCOSE 44* 93  BUN 30* 31*  CREATININE 1.60* 1.53*  CALCIUM 8.3* 8.3*   PT/INR Recent Labs    11/18/21 0644 11/20/21 0441  LABPROT 17.4* 18.2*  INR 1.4* 1.5*    Studies/Results: US Abdomen Limited RUQ (LIVER/GB)  Result Date: 11/18/2021 CLINICAL DATA:  Abnormal LFTs. EXAM: ULTRASOUND ABDOMEN LIMITED RIGHT UPPER QUADRANT COMPARISON:  CT of the abdomen and pelvis 11/18/2021 FINDINGS: Gallbladder: Gallbladder wall is thickened measuring 5.3 mm. No sonographic Percell Miller sign is described. Shadowing gallstones measure up to 11 mm. Common bile duct: Diameter: 3.0 mm, within normal limits. Liver: Liver is shrunken within nodular appearance. Heterogeneous increased echogenicity  noted. No discrete lesions are present. Portal vein is patent on color Doppler imaging with normal direction of blood flow towards the liver. Other: Moderate abdominal ascites is present. IMPRESSION: 1. Gallbladder wall thickening with cholelithiasis suggesting acute cholecystitis. 2. Findings suggestive of hepatic cirrhosis. 3. Ascites. Electronically Signed   By: San Morelle M.D.   On: 11/18/2021 12:01    Anti-infectives: Anti-infectives (From admission, onward)    Start     Dose/Rate Route Frequency Ordered Stop   11/18/21 1600  metroNIDAZOLE (FLAGYL) IVPB 500 mg        500 mg 100 mL/hr over 60 Minutes Intravenous Every 12 hours 11/18/21 1430     11/18/21 1530  cefTRIAXone (ROCEPHIN) 2 g in sodium chloride 0.9 % 100 mL IVPB        2 g 200 mL/hr over 30 Minutes Intravenous Every 24 hours 11/18/21 1430         Assessment/Plan:  Patient is a 71 year old male who was admitted with elevated LFTs, newly diagnosed cirrhosis, and concern for cholecystitis.  -Patient's LFTs improving, ALP within normal limits; no leukocytosis -Patient still denies right upper quadrant tenderness, nausea, and vomiting -HIDA scan today to evaluate for cystic duct obstruction -If gallbladder not visualized on HIDA scan, recommend cholecystostomy tube placement by IR -Continue Rocephin and Flagyl until completion of HIDA scan -Patient is a Child-Pugh class C based on his blood work, which means his abdominal surgery perioperative mortality is >80%.  Would recommend against any surgery given his significant operative risk -Okay for diet post HIDA -Care per  primary team   LOS: 2 days    Johnsie Moscoso A Janina Trafton 11/20/2021

## 2021-11-20 NOTE — Consult Note (Addendum)
@LOGO @   Referring Provider: Dr. Denton Brick Courage Primary Care Physician:  [name], [place] Primary Gastroenterologist:  Dr. Abbey Chatters (previously unassigned)  Date of Admission: 11/18/2021 Date of Consultation: 11/20/2021  Reason for Consultation: Elevated LFTs, cirrhosis  HPI:  Edward [name] is a 71 y.o. year old male with past medical history significant for moderate thrombocytopenia, macrocytic anemia, copper deficiency following with hematology, CKD, HLD, CAD, DM 2, HTN, who presented to the emergency room with worsening fatigue, weakness, myalgias, malaise worsening over the last month.  ED course: VSS  Hemoglobin 10.4 (L) with normocytic indices, platelets 52 (L).  Creatinine 2.02 (H), BUN 34 (H), AST 274 (H), ALT 120 (H), total bilirubin 3.1 (H). INR 1.4 (H) CK elevated at 6902. Lactic acid normal.  CT chest abdomen pelvis without contrast:  Cirrhosis with small volume ascites, dilated portal vein and splenomegaly indicative of portal hypertension, mildly patulous and diffusely thickened esophagus without discrete esophageal mass cholelithiasis without acute cholecystitis, small pulmonary nodules that are nonspecific but likely benign, aortic atherosclerosis.  RUQ ultrasound:  Shadowing gallstones measuring up to 11 mm, gallbladder wall thickening measuring 5.3 mm, no Murphy sign, normal CBD, liver cirrhosis without focal liver lesion, patent portal vein, moderate abdominal ascites.  Copper supplementation and atorvastatin was discontinued. He was started on IV fluids, Protonix.   General surgery was consulted due to concerns for possible cholecystitis.  Noted patient's presentation was not characteristic for acute cholecystitis.  Suspected gallbladder wall thickening was secondary to underlying liver disease.  And recommended HIDA and difficult bladder not visualized on HIDA, recommend cholecystostomy tube placement by IR due to high surgical risk.  He was empirically  started on antibiotics.  Ammonia was checked yesterday and slightly elevated at 49.  Subsequently started on lactulose.   Consult:  Progressive weakness over the last several months and swimmy headedness. Had to get someone to lift his legs and help him get out of bed or a chair.   No fever or chills.  No prior illness.  No nausea, vomiting, brbpr, melena.   Denies rash, joint pain.   Has noticed abdominal swelling for the last several weeks. Pressure in the lower abdomen around the belt line described as a bloated sensation that comes and goes. Seems to improve with bowel movements. Usually bowels move every other day. Today 3-4 Bms daily, loose. Slight swelling in his ankles for the last few weeks.   Feels foggy headed at times. Not currently.   Was taking iron and copper daily. Stopped iron a while ago.   New medications: No new medications.  Alcohol: None.  Illicit drug use: None.  OTC supplements/herbal teas:Copper.  Tylenol: Rarely. No Fhx of liver disease or autoimmune disease.   Colonoscopy about 10 years ago in [place]. Reports 2 polyps. With [facility].  No prior EGD.   No heartburn, dysphagia.  Past Medical History:  Diagnosis Date   Cataract    CKD (chronic kidney disease) stage 3, GFR 30-59 ml/min (HCC)    Coronary atherosclerosis of native coronary artery    BMS LAD 2006; residual 75% distal RCA; EF 50%   Essential hypertension    Glaucoma    Low back pain    MI (myocardial infarction) (Edward [name])    Anterior 2006   Mixed hyperlipidemia    Renal insufficiency    Type 2 diabetes mellitus ([place])     Past Surgical History:  Procedure Laterality Date   CATARACT EXTRACTION, BILATERAL     CORONARY ANGIOPLASTY WITH STENT  PLACEMENT  2006    Prior to Admission medications   Medication Sig Start Date End Date Taking? Authorizing Provider  acetaminophen (TYLENOL) 500 MG tablet Take 500 mg by mouth every 6 (six) hours as needed.   Yes [provider]   aspirin 81 MG tablet Take 81 mg by mouth daily.   Yes [provider]  atorvastatin (LIPITOR) 40 MG tablet Take 1 tablet (40 mg total) by mouth daily. 08/15/21  Yes Martin, Mary-Margaret, FNP  brimonidine (ALPHAGAN) 0.2 % ophthalmic solution Place 1 drop into both eyes 2 (two) times daily. 03/14/20  Yes [provider]  Cholecalciferol (VITAMIN D3) 2000 UNITS TABS Take 1 tablet by mouth daily.   Yes [provider]  Copper 5 MG TABS Take 1 tablet (5 mg total) by mouth 2 (two) times daily. Patient taking differently: Take 2.5 mg by mouth 2 (two) times daily. 09/05/21  Yes Pennington, Rebekah M, PA-C  Cyanocobalamin (B-12 PO) Take by mouth.   Yes [provider]  gemfibrozil (LOPID) 600 MG tablet TAKE 1 TABLET BY MOUTH TWICE DAILY BEFORE MEAL(S) 08/15/21  Yes Hassell Done, Mary-Margaret, FNP  glipiZIDE (GLUCOTROL) 5 MG tablet 2 tablets in morning and 1 tablet at night Patient taking differently: Take 5 mg by mouth See admin instructions. 2 tablets in morning and 1 tablet at evening 08/15/21  Yes Martin, Mary-Margaret, FNP  isosorbide mononitrate (IMDUR) 30 MG 24 hr tablet Take 1 tablet (30 mg total) by mouth daily. New 01/30/2018 08/15/21 11/18/21 Yes Martin, Mary-Margaret, FNP  LOKELMA 5 g packet Take 5 g by mouth 2 (two) times a week. Monday and Thursdays 03/09/21  Yes [provider]  metoprolol tartrate (LOPRESSOR) 25 MG tablet Take 0.5 tablets (12.5 mg total) by mouth in the morning and at bedtime. 08/15/21 08/15/22 Yes Martin, Mary-Margaret, FNP  nitroGLYCERIN (NITROSTAT) 0.4 MG SL tablet Place 1 tablet (0.4 mg total) under the tongue every 5 (five) minutes x 3 doses as needed for chest pain (if no relief after 2nd dose, proceed to the ED for an evaluation). 10/06/20  Yes Satira Sark, MD  Omega-3 Fatty Acids (FISH OIL) 1000 MG CAPS Take by mouth.   Yes [provider]  pantoprazole (PROTONIX) 40 MG tablet Take 1 tablet (40 mg total) by mouth daily.  08/15/21  Yes Martin, Mary-Margaret, FNP  sodium bicarbonate 650 MG tablet Take by mouth. 08/12/21  Yes [provider]  traZODone (DESYREL) 100 MG tablet Take 1 tablet (100 mg total) by mouth at bedtime. 08/15/21  Yes Martin, Mary-Margaret, FNP  VELTASSA 8.4 g packet Take 8.4 g by mouth daily. 06/16/21  Yes [provider]  ACCU-CHEK AVIVA PLUS test strip USE TO CHECK BLOOD SUGAR ONCE DAILY 11/12/17   [name], FNP    Current Facility-Administered Medications  Medication Dose Route Frequency Provider Last Rate Last Admin   0.9 %  sodium chloride infusion   Intravenous PRN Emokpae, Courage, MD       acetaminophen (TYLENOL) tablet 650 mg  650 mg Oral Q6H PRN Emokpae, Courage, MD       Or   acetaminophen (TYLENOL) suppository 650 mg  650 mg Rectal Q6H PRN Emokpae, Courage, MD       aspirin chewable tablet 81 mg  81 mg Oral Daily Emokpae, Courage, MD   81 mg at 11/20/21 0831   bisacodyl (DULCOLAX) suppository 10 mg  10 mg Rectal Daily PRN Roxan Hockey, MD       cefTRIAXone (ROCEPHIN) 2 g  in sodium chloride 0.9 % 100 mL IVPB  2 g Intravenous Q24H Roxan Hockey, MD   Stopped at 11/19/21 1508   dextrose 5 %-0.9 % sodium chloride infusion   Intravenous Continuous Emokpae, Courage, MD 100 mL/hr at 11/20/21 0509 Infusion Verify at 11/20/21 0509   feeding supplement (ENSURE ENLIVE / ENSURE PLUS) liquid 237 mL  237 mL Oral BID BM Emokpae, Courage, MD   237 mL at 11/19/21 1433   ferrous sulfate tablet 325 mg  325 mg Oral Q breakfast Emokpae, Courage, MD   325 mg at 11/20/21 0831   heparin injection 5,000 Units  5,000 Units Subcutaneous Q8H Emokpae, Courage, MD   5,000 Units at 11/19/21 2040   insulin aspart (novoLOG) injection 0-5 Units  0-5 Units Subcutaneous QHS Emokpae, Courage, MD       insulin aspart (novoLOG) injection 0-6 Units  0-6 Units Subcutaneous TID WC Emokpae, Courage, MD   1 Units at 11/19/21 1218   lactulose (CHRONULAC) 10 GM/15ML solution 10 g  10 g Oral  Daily Emokpae, Courage, MD   10 g at 11/20/21 0093   metoprolol tartrate (LOPRESSOR) tablet 12.5 mg  12.5 mg Oral BID Emokpae, Courage, MD   12.5 mg at 11/20/21 8182   metroNIDAZOLE (FLAGYL) IVPB 500 mg  500 mg Intravenous Q12H Emokpae, Courage, MD   Stopped at 11/20/21 0359   ondansetron (ZOFRAN) tablet 4 mg  4 mg Oral Q6H PRN Emokpae, Courage, MD       Or   ondansetron (ZOFRAN) injection 4 mg  4 mg Intravenous Q6H PRN Emokpae, Courage, MD       pantoprazole (PROTONIX) EC tablet 40 mg  40 mg Oral Daily Emokpae, Courage, MD   40 mg at 11/20/21 9937   polyethylene glycol (MIRALAX / GLYCOLAX) packet 17 g  17 g Oral Daily PRN Emokpae, Courage, MD       sodium bicarbonate tablet 650 mg  650 mg Oral TID Denton Brick, Courage, MD   650 mg at 11/20/21 0831   sodium chloride flush (NS) 0.9 % injection 3 mL  3 mL Intravenous Q12H Emokpae, Courage, MD   3 mL at 11/19/21 2043   sodium chloride flush (NS) 0.9 % injection 3 mL  3 mL Intravenous Q12H Emokpae, Courage, MD   3 mL at 11/19/21 2043   sodium chloride flush (NS) 0.9 % injection 3 mL  3 mL Intravenous PRN Emokpae, Courage, MD       traZODone (DESYREL) tablet 50 mg  50 mg Oral QHS PRN Denton Brick, Courage, MD   50 mg at 11/19/21 2040   vitamin B-12 (CYANOCOBALAMIN) tablet 1,000 mcg  1,000 mcg Oral Daily Roxan Hockey, MD   1,000 mcg at 11/20/21 0831    Allergies as of 11/18/2021   (No Known Allergies)    Family History  Problem Relation Age of Onset   Aneurysm Father        Brain aneurysm   Lung cancer Mother        Small cell carcinoma of lung,kidney and heart   Heart disease Mother    Cancer Brother        lungs   Diabetes Sister    Diabetes Sister    Diabetes Brother    Diabetes Brother     Social History   Socioeconomic History   Marital status: Legally Separated    Spouse name: [name]    Number of children: 2   Years of education: 7th   Highest education level: 7th grade  Occupational History   Occupation: Retired  Tobacco Use    Smoking status: Former    Packs/day: 2.00    Years: 20.00    Total pack years: 40.00    Types: Cigarettes    Quit date: 05/08/1987    Years since quitting: 34.5   Smokeless tobacco: Never  Vaping Use   Vaping Use: Never used  Substance and Sexual Activity   Alcohol use: No    Alcohol/week: 0.0 standard drinks of alcohol   Drug use: No   Sexual activity: Not Currently  Other Topics Concern   Not on file  Social History Narrative   Lives alone    Social Determinants of Health   Financial Resource Strain: Low Risk  (07/11/2021)   Overall Financial Resource Strain (CARDIA)    Difficulty of Paying Living Expenses: Not very hard  Food Insecurity: No Food Insecurity (07/11/2021)   Hunger Vital Sign    Worried About Running Out of Food in the Last Year: Never true    Ran Out of Food in the Last Year: Never true  Transportation Needs: No Transportation Needs (07/11/2021)   PRAPARE - Hydrologist (Medical): No    Lack of Transportation (Non-Medical): No  Physical Activity: Insufficiently Active (07/11/2021)   Exercise Vital Sign    Days of Exercise per Week: 7 days    Minutes of Exercise per Session: 20 min  Stress: No Stress Concern Present (07/11/2021)   [place]    Feeling of Stress : Not at all  Social Connections: Socially Isolated (07/11/2021)   Social Connection and Isolation Panel [NHANES]    Frequency of Communication with Friends and Family: More than three times a week    Frequency of Social Gatherings with Friends and Family: More than three times a week    Attends Religious Services: Never    Marine scientist or Organizations: No    Attends Archivist Meetings: Never    Marital Status: Separated  Intimate Partner Violence: Not At Risk (07/11/2021)   Humiliation, Afraid, Rape, and Kick questionnaire    Fear of Current or Ex-Partner: No    Emotionally Abused: No     Physically Abused: No    Sexually Abused: No    Review of Systems: Gen: Denies fever, chills. Admits to occasional lightheadedness.  CV: Denies chest pain, heart palpitations. Resp: Admits to SOB. No cough.  GI: See HPI GU : Denies urinary burning, urinary frequency, urinary incontinence.  MS: Denies joint pain. Derm: Denies rash. Psych: Denies depression, anxiety. Heme: See HPI  Physical Exam: Vital signs in last 24 hours: Temp:  [98.2 F (36.8 C)-98.6 F (37 C)] 98.2 F (36.8 C) (07/16 2038) Pulse Rate:  [74-80] 80 (07/16 2038) Resp:  [16-18] 18 (07/16 2038) BP: (118-137)/(62-69) 128/67 (07/17 0832) SpO2:  [96 %-97 %] 96 % (07/16 2038) Weight:  [76.5 kg] 76.5 kg (07/17 0552) Last BM Date : 11/21/21 General:   Alert,  Well-developed, well-nourished, pleasant and cooperative in NAD Head:  Normocephalic and atraumatic. Eyes:  Sclera clear, no icterus.   Conjunctiva pink. Ears:  Normal auditory acuity. Nose:  No deformity, discharge,  or lesions. Mouth:  No deformity or lesions, dentition normal. Neck:  Supple; no masses or thyromegaly. Lungs:  Clear throughout to auscultation.   No wheezes, crackles, or rhonchi. No acute distress. Heart:  Regular rate and rhythm; no murmurs, clicks, rubs,  or gallops. Abdomen:  Soft, nontender and nondistended. No masses, hepatosplenomegaly or hernias noted. Normal bowel sounds, without guarding, and without rebound.   Rectal:  Deferred until time of colonoscopy.   Msk:  Symmetrical without gross deformities. Normal posture. Pulses:  Normal pulses noted. Extremities:  Without clubbing or edema. Neurologic:  Alert and  oriented x4;  grossly normal neurologically. Skin:  Intact without significant lesions or rashes. Cervical Nodes:  No significant cervical adenopathy. Psych:  Alert and cooperative. Normal mood and affect.  Intake/Output from previous day: 07/16 0701 - 07/17 0700 In: 1958.8 [P.O.:720; I.V.:933.9; IV Piggyback:304.9] Out:  -  Intake/Output this shift: No intake/output data recorded.  Lab Results: Recent Labs    11/18/21 0644 11/19/21 0451 11/20/21 0441  WBC 5.1 6.3 6.0  HGB 10.4* 11.5* 10.6*  HCT 30.3* 33.9* 31.8*  PLT 52* 58* 57*   BMET Recent Labs    11/18/21 0644 11/19/21 0451 11/20/21 0441  NA 134* 138 140  K 3.7 3.5 3.9  CL 99 106 109  CO2 25 24 24   GLUCOSE 79 44* 93  BUN 34* 30* 31*  CREATININE 2.02* 1.60* 1.53*  CALCIUM 8.2* 8.3* 8.3*   LFT Recent Labs    11/18/21 0644 11/19/21 0451 11/20/21 0441  PROT 5.3* 5.3* 5.1*  ALBUMIN 2.4* 2.4* 2.2*  AST 274* 290* 260*  ALT 120* 127* 127*  ALKPHOS 50 49 44  BILITOT 3.1* 2.8* 2.1*   PT/INR Recent Labs    11/18/21 0644 11/20/21 0441  LABPROT 17.4* 18.2*  INR 1.4* 1.5*    Studies/Results: US Abdomen Limited RUQ (LIVER/GB)  Result Date: 11/18/2021 CLINICAL DATA:  Abnormal LFTs. EXAM: ULTRASOUND ABDOMEN LIMITED RIGHT UPPER QUADRANT COMPARISON:  CT of the abdomen and pelvis 11/18/2021 FINDINGS: Gallbladder: Gallbladder wall is thickened measuring 5.3 mm. No sonographic Percell Miller sign is described. Shadowing gallstones measure up to 11 mm. Common bile duct: Diameter: 3.0 mm, within normal limits. Liver: Liver is shrunken within nodular appearance. Heterogeneous increased echogenicity noted. No discrete lesions are present. Portal vein is patent on color Doppler imaging with normal direction of blood flow towards the liver. Other: Moderate abdominal ascites is present. IMPRESSION: 1. Gallbladder wall thickening with cholelithiasis suggesting acute cholecystitis. 2. Findings suggestive of hepatic cirrhosis. 3. Ascites. Electronically Signed   By: San Morelle M.D.   On: 11/18/2021 12:01    Impression: 71 y.o. year old male with past medical history significant for moderate thrombocytopenia, macrocytic anemia, copper deficiency following with hematology, CKD, HLD, CAD, DM 2, HTN, found to have elevated CK, possible calculus  cholecystitis, with elevated LFTs and liver cirrhosis.  Elevated LFTs:  Etiology unclear. History of mildly elevated LFTs in the latter part of 2022, but normalized in January and had been normal until 7/11 when he was found to have AST 280, ALT 93, total bilirubin 2.8, alk phos normal.  Persistent elevation this admission with AST 260, ALT 127, T. bili 2.1, and alk phos remaining normal today.  Denies any new medications, alcohol use, illicit drug use, routine Tylenol use, OTC supplements aside from copper, family history of liver disease or autoimmune conditions. Hep B and C serologies negative in 2021. Recent iron panel wnl. Wilsons disease has been in question outpatient due to low ceruloplasmin and elevated 24 hr urine copper.  He will need slit-lamp with ophthalmology to rule out [employer] rings. May need liver biopsy. CK is elevated this admission which I suspect is contributing to acutely elevated liver enzymes. Additionally, with new diagnosis of cirrhosis, will go  ahead and complete additional work up to rule out AIH and viral hepatitis.   Cirrhosis:  MELD NA 18. Child Pugh C.  Etiology unclear.  Ultrasound in August 2022 with normal appearing liver.  Denies alcohol use.  Notably, Wilson's disease has been in question outpatient the setting of low ceruloplasmin and elevated 24 hr urine copper. He will need slit-lamp with ophthalmology to rule out [employer] rings. May need liver biopsy. Additionally, we will evaluate for AIH and recheck for viral hepatitis (Hep B and C serologies negative in 2021). Cirrhosis is complicated by non-tense ascites and coagulopathy with thrombocytopenia (57) and elevated INR (1.5). Portal vein appear patent on [place] this admission. No symptoms of HE, but ammonia slightly elevated at 49 yesterday and subsequently started on lactulose with 3-4 bowel movements today.   Cholelithiasis with question of cholecystitis: Gallbladder wall thickening noted on ultrasound  suggesting cholecystitis.  HIDA scan today with patent cystic and common bile duct, reduced gallbladder EF which was at least somewhat related to delayed radiotracer clearance from the liver but at least suggestive of chronic cholecystitis/biliary dyskinesia. Clinically, asymptomatic.    Esophageal wall thickening:  Mildly patulous and diffuse thickened esophagus without discrete esophageal mass noted on CT A/P without contrast.  No prior EGD.  Denies heartburn or dysphagia symptoms. No significant weight loss. Will need non-emergent EGD for further evaluation. Timing to be determined. Surgery following, but suspect no need for intervention as patient is asymptotic and cystic and CBD are patent. Will defer current empiric antibiotic management to general surgery..   Anemia:  Chronic mild normocytic anemia with baseline hemoglobin in the 11-12 range, following with hematology.  Per chart review, no evidence of iron deficiency, B12, or folate deficiency.  Had previously been taking iron supplement, but discontinued it, "a while ago".  Hemoglobin 10.4 on admission, down from 12.2, 6 days prior.  Hemoglobin fairly stable today at 10.6. Denies overt GI bleeding.  Will continue to monitor. Reports colonoscopy about 10 years ago with [facility] with 2 polyps.  No prior EGD.   Plan: Fractionate bilirubin, acute hepatitis panel, ANA, AMA, ASMA, immunoglobulins.  Monitor MELD labs daily.  Will need EGD for variceal screening and to evaluate esophageal wall thickening.  Timing to be determined.  Will discuss with Dr. Abbey Chatters. Possibly outpatient.  May need liver biopsy at some point.  Monitor for worsening ascites. Holding off on diuretics in the setting of elevated CK and need for IV fluids at this time.  Avoid hepatotoxic meds  Continue to hold copper Monitor H/H and for overt GI bleeding.  Will need slit lamp evaluation by ophthalmologist to evaluate for [facility] rings. Ok to continue lactulose with  goal of 2-3 BMs daily.    LOS: 2 days    11/20/2021, 9:37 AM   Aliene Altes, PA-C [facility]

## 2021-11-20 NOTE — Progress Notes (Signed)
PROGRESS NOTE     Edward [name], is a 71 y.o. male, DOB - 1950-11-21, BEM:[id number]  Admit date - 11/18/2021   Admitting Physician Herchel Hopkin Denton Brick, MD  Outpatient Primary MD for the patient is [name], [place]  LOS - 2  Chief Complaint  Patient presents with   Multiple complaints        Brief Narrative:  71 y.o. male with past medical history relevant for moderate thrombocytopenia, macrocytic anemia with superimposed iron deficiency, copper deficiency, CKD 3B, HLD, CAD, DM2 and HTN admitted with possible Calculus Cholecystitis with Elevated LFTs and Liver Cirrhosis  -Assessment and Plan: 1)Possible Acute Calculus Cholecystitis with Elevated LFTs and Liver Cirrhosis---currently no significant abd pain - CT abdomen and pelvis and right upper quadrant ultrasound consistent with gallstones and cholecystitis -Surgical consult appreciated -Gi consult appreciated -Consider stopping IV Rocephin and Flagyl on 11/21/2021 -As needed pain medication and antiemetics HIDA scan with cystic duct patency noted -May need liver biopsy as outpatient -EGD as outpatient to rule out varices   2)Liver Cirrhosis with Ascites and Portal Hypertension---??? Etiology -No significant alcohol use -INR 1.4, bilirubin is 3.1 -Random Urine Copper high at 72 (09/12/21) -PTA Patient was on atorvastatin and copper supplements -??? Wilson's disease in the setting of low copper and low ceruloplasmin.  Low ceruloplasmin may also be from copper deficiency-- may need ophthalmology for slit lamp exam to rule out [employer] rings. -Viral hepatitis profile was negative in 2021 -Ammonia was 49--continue lactulose -Would benefit from EGD and liver biopsy as outpatient as noted above #1 defer to GI service - 3)History of copper deficiency--- has been on copper replacement for over 1 year -Random Urine Copper high at 72 (09/12/21) -Hold copper tablets as copper toxicity can lead to hepatic  dysfunction -Discussed with patient's hematologist Dr. Delton Coombes--- liver biopsy will be able to tell if patient has copper toxicity are not   4)Elevated LFTs/elevated CPK -CT abdomen and pelvis and right upper quadrant ultrasound consistent with gallstone cholecystitis -Elevated CPK noted----Total CPK 6902>>5,817 -Elevated copper levels noted -Stopped copper supplements and Stop atorvastatin   5)Anemia and Thrombocytopenia--- with macrocytosis --diagnosed in May 2021 -- SPEP in June 2021 was normal.  Erythropoietin normal at 11.9. -Previously declined bone marrow biopsy for evaluation of possible early MDS --Hgb 10.4>>11.5 - Platelets 52>>58 -Previously treated with copper supplements -Prior work-up revealed somewhat low iron --- B12 and Folate were Not low   6)AKI----acute kidney injury on CKD stage -3B ---Creatinine on admission was 2.02 -baseline---  1.5 to 1.6 -Creatinine is down to 1.53 -Suspect due to dehydration -renally adjust medications, avoid nephrotoxic agents / dehydration  / hypotension -Patient usually follows with Dr. Theador Hawthorne   7)Hyponatremia------ suspect dehydration related,  -sodium 134>>138 --c/n IVF --avoid excessive free water   8)DM2---  A1c- 6.7 on 11/14/2021.[name]  Reflecting good diabetic control PTA Use Novolog/Humalog Sliding scale insulin with Accu-Cheks/Fingersticks as ordered    9)-Esophagitis--- CT chest shows -Patulous and thickened esophagus. -Protonix as ordered EGD as outpatient to be done as noted above #1   10)Social/ethics--- Discussed with patient's ex-wife and son Edward [name] at bedside -Patient is a full code  11)HypoGlycemia----due to poor oral intake in setting of underlying liver cirrhosis  -feed liberally and frequently   12) generalized weakness/malaise--- get physical therapy eval -Check CPK   Disposition/Need for in-Hospital Stay- patient unable to be discharged at this time due to -gallstones with cholecystitis and elevated LFTs  with cirrhosis requiring IV antibiotics and further investigation*   Status  is: Inpatient   Remains inpatient appropriate because:    Dispo: The patient is from: Home              Anticipated d/c is to: Home              Anticipated d/c date is: 1 days              Patient currently is not medically stable to d/c. Barriers: Not Clinically Stable-   Code Status :  -  Code Status: Full Code   Family Communication:    (patient is alert, awake and coherent)  Discussed with patient's ex- wife and patient's son Edward [name]  DVT Prophylaxis  :   - SCDs   heparin injection 5,000 Units Start: 11/18/21 1515 SCDs Start: 11/18/21 1420 Place TED hose Start: 11/18/21 1420   Lab Results  Component Value Date   PLT 57 (L) 11/20/2021    Inpatient Medications  Scheduled Meds:  aspirin  81 mg Oral Daily   feeding supplement  237 mL Oral BID BM   ferrous sulfate  325 mg Oral Q breakfast   heparin  5,000 Units Subcutaneous Q8H   insulin aspart  0-5 Units Subcutaneous QHS   insulin aspart  0-6 Units Subcutaneous TID WC   lactulose  10 g Oral Daily   metoprolol tartrate  12.5 mg Oral BID   pantoprazole  40 mg Oral Daily   sodium bicarbonate  650 mg Oral TID   sodium chloride flush  3 mL Intravenous Q12H   sodium chloride flush  3 mL Intravenous Q12H   vitamin B-12  1,000 mcg Oral Daily   Continuous Infusions:  sodium chloride     cefTRIAXone (ROCEPHIN)  IV 2 g (11/20/21 1526)   dextrose 5 % and 0.9% NaCl 100 mL/hr at 11/20/21 0509   metronidazole 500 mg (11/20/21 1642)   PRN Meds:.sodium chloride, acetaminophen **OR** acetaminophen, bisacodyl, ondansetron **OR** ondansetron (ZOFRAN) IV, polyethylene glycol, sodium chloride flush, traZODone   Anti-infectives (From admission, onward)    Start     Dose/Rate Route Frequency Ordered Stop   11/18/21 1600  metroNIDAZOLE (FLAGYL) IVPB 500 mg        500 mg 100 mL/hr over 60 Minutes Intravenous Every 12 hours 11/18/21 1430     11/18/21 1530   cefTRIAXone (ROCEPHIN) 2 g in sodium chloride 0.9 % 100 mL IVPB        2 g 200 mL/hr over 30 Minutes Intravenous Every 24 hours 11/18/21 1430           Subjective: [name] today has no fevers, no emesis,  No chest pain,   -Up in chair, no further episodes of hypoglycemia oral intake is better   Objective: Vitals:   11/20/21 0552 11/20/21 0832 11/20/21 1250 11/20/21 2100  BP:  128/67 123/64 124/68  Pulse:   64 83  Resp:   20 20  Temp:   97.6 F (36.4 C) 98.5 F (36.9 C)  TempSrc:    Oral  SpO2:   97% 96%  Weight: 76.5 kg     Height:        Intake/Output Summary (Last 24 hours) at 11/20/2021 2107 Last data filed at 11/20/2021 1655 Gross per 24 hour  Intake 2268.27 ml  Output --  Net 2268.27 ml   Filed Weights   11/18/21 1325 11/19/21 0500 11/20/21 0552  Weight: 72.5 kg 74.6 kg 76.5 kg    Physical Exam  Gen:- Awake Alert,  in no apparent  distress  HEENT:- [place].AT, mild sclera icterus Neck-Supple Neck,No JVD,.  Lungs-  CTAB , fair symmetrical air movement CV- S1, S2 normal, regular  Abd-  +ve B.Sounds, soft, ND,+BS, no significant abdominal tenderness, negative Murphy sign, no rebound or guarding Extremity/Skin:- No  edema, pedal pulses present  Psych-affect is appropriate, oriented x3 Neuro-generalized weakness, no new focal deficits, no tremors  Data Reviewed: I have personally reviewed following labs and imaging studies  CBC: Recent Labs  Lab 11/14/21 0820 11/18/21 0644 11/19/21 0451 11/20/21 0441  WBC 6.2 5.1 6.3 6.0  NEUTROABS 4.5  --   --   --   HGB 12.2* 10.4* 11.5* 10.6*  HCT 35.5* 30.3* 33.9* 31.8*  MCV 96 97.1 96.9 98.8  PLT 61* 52* 58* 57*   Basic Metabolic Panel: Recent Labs  Lab 11/14/21 0820 11/18/21 0644 11/19/21 0451 11/20/21 0441  NA 137 134* 138 140  K 4.0 3.7 3.5 3.9  CL 98 99 106 109  CO2 22 25 24 24   GLUCOSE 57* 79 44* 93  BUN 18 34* 30* 31*  CREATININE 1.58* 2.02* 1.60* 1.53*  CALCIUM 9.1 8.2* 8.3* 8.3*  MG  --  2.1   --   --    GFR: Estimated Creatinine Clearance: 47 mL/min (A) (by C-G formula based on SCr of 1.53 mg/dL (H)). Liver Function Tests: Recent Labs  Lab 11/14/21 0820 11/18/21 0644 11/19/21 0451 11/20/21 0441 11/20/21 1633  AST 288* 274* 290* 260*  --   ALT 93* 120* 127* 127*  --   ALKPHOS 65 50 49 44  --   BILITOT 2.8* 3.1* 2.8* 2.1* 2.9*  PROT 6.0 5.3* 5.3* 5.1*  --   ALBUMIN 3.4* 2.4* 2.4* 2.2*  --    Cardiac Enzymes: Recent Labs  Lab 11/18/21 0644 11/19/21 0451  CKTOTAL 6,902* 5,817*    Radiology Studies: NM Hepato W/EF  Result Date: 11/20/2021 CLINICAL DATA:  Right upper quadrant abdominal pain x1 month, nondiagnostic ultrasound. EXAM: NUCLEAR MEDICINE HEPATOBILIARY IMAGING WITH GALLBLADDER EF TECHNIQUE: Sequential images of the abdomen were obtained out to 60 minutes following intravenous administration of radiopharmaceutical. After slow intravenous infusion of 1.5 micrograms Cholecystokinin, gallbladder ejection fraction was determined. RADIOPHARMACEUTICALS:  5.5 mCi Tc-99mCholetec IV COMPARISON:  Ultrasound November 18, 2021 FINDINGS: Delayed blood pool activity with delayed clearance of radiotracer from the liver. Prompt uptake and biliary excretion of activity by the liver is seen. Gallbladder activity is visualized, consistent with patency of cystic duct. Biliary activity passes into small bowel, consistent with patent common bile duct. There is an initial drop in radiotracer activity post CCK administration however there is a subsequent increase in activity towards the end of scintigraphic sequences. In the setting of delayed clearance of radiotracer from the liver. Calculated gallbladder ejection fraction of -56% (at 60 min, normal ejection fraction is greater than 40%.) IMPRESSION: 1. Delayed clearance of radiotracer from blood pool and the liver suggestive of hepatitis, which limits the utility of the study. 2. Reduced gallbladder ejection fraction, which is at least somewhat  related to the delayed radiotracer clearance from the liver but is at least suggestive of chronic cholecystitis/biliary dyskinesia. Electronically Signed   By: JDahlia BailiffM.D.   On: 11/20/2021 13:06     Scheduled Meds:  aspirin  81 mg Oral Daily   feeding supplement  237 mL Oral BID BM   ferrous sulfate  325 mg Oral Q breakfast   heparin  5,000 Units Subcutaneous Q8H   insulin aspart  0-5 Units  Subcutaneous QHS   insulin aspart  0-6 Units Subcutaneous TID WC   lactulose  10 g Oral Daily   metoprolol tartrate  12.5 mg Oral BID   pantoprazole  40 mg Oral Daily   sodium bicarbonate  650 mg Oral TID   sodium chloride flush  3 mL Intravenous Q12H   sodium chloride flush  3 mL Intravenous Q12H   vitamin B-12  1,000 mcg Oral Daily   Continuous Infusions:  sodium chloride     cefTRIAXone (ROCEPHIN)  IV 2 g (11/20/21 1526)   dextrose 5 % and 0.9% NaCl 100 mL/hr at 11/20/21 0509   metronidazole 500 mg (11/20/21 1642)    LOS: 2 days   Roxan Hockey M.D on 11/20/2021 at 9:07 PM  Go to [email or web address] - for contact info  [facility] - Office  [phone]  If 7PM-7AM, please contact night-coverage [email or web address] 11/20/2021, 9:07 PM

## 2021-11-20 NOTE — TOC Progression Note (Signed)
  Transition of Care [facility]) Screening Note   Patient Details  Name: Edward [name] [facility] Date of Birth: 09-Apr-1951   Transition of Care [facility]) CM/SW Contact:    Shade Flood, LCSW Phone Number: 11/20/2021, 10:46 AM    Received [facility] consult for HH/DME needs. Pt does not have PT evaluation ordered. Attending MD does not identify any need at this time. Consult will be cleared.  Transition of Care Department [facility]) has reviewed patient and no TOC needs have been identified at this time. We will continue to monitor patient advancement through interdisciplinary progression rounds. If new patient transition needs arise, please place a TOC consult.

## 2021-11-21 ENCOUNTER — Inpatient Hospital Stay (HOSPITAL_COMMUNITY): Payer: 59

## 2021-11-21 ENCOUNTER — Inpatient Hospital Stay (HOSPITAL_COMMUNITY): Payer: 59 | Admitting: Physician Assistant

## 2021-11-21 DIAGNOSIS — R0602 Shortness of breath: Secondary | ICD-10-CM

## 2021-11-21 DIAGNOSIS — R188 Other ascites: Secondary | ICD-10-CM

## 2021-11-21 DIAGNOSIS — K810 Acute cholecystitis: Secondary | ICD-10-CM | POA: Diagnosis not present

## 2021-11-21 DIAGNOSIS — R933 Abnormal findings on diagnostic imaging of other parts of digestive tract: Secondary | ICD-10-CM

## 2021-11-21 LAB — COMPREHENSIVE METABOLIC PANEL
ALT: 148 U/L — ABNORMAL HIGH (ref 0–44)
AST: 244 U/L — ABNORMAL HIGH (ref 15–41)
Albumin: 2.5 g/dL — ABNORMAL LOW (ref 3.5–5.0)
Alkaline Phosphatase: 53 U/L (ref 38–126)
Anion gap: 7 (ref 5–15)
BUN: 32 mg/dL — ABNORMAL HIGH (ref 8–23)
CO2: 23 mmol/L (ref 22–32)
Calcium: 8.2 mg/dL — ABNORMAL LOW (ref 8.9–10.3)
Chloride: 106 mmol/L (ref 98–111)
Creatinine, Ser: 1.5 mg/dL — ABNORMAL HIGH (ref 0.61–1.24)
GFR, Estimated: 50 mL/min — ABNORMAL LOW (ref >60)
Glucose, Bld: 179 mg/dL — ABNORMAL HIGH (ref 70–99)
Potassium: 3.6 mmol/L (ref 3.5–5.1)
Sodium: 136 mmol/L (ref 135–145)
Total Bilirubin: 2.1 mg/dL — ABNORMAL HIGH (ref 0.3–1.2)
Total Protein: 5.9 g/dL — ABNORMAL LOW (ref 6.5–8.1)

## 2021-11-21 LAB — PROTIME-INR
INR: 1.5 — ABNORMAL HIGH (ref 0.8–1.2)
Prothrombin Time: 17.7 seconds — ABNORMAL HIGH (ref 11.4–15.2)

## 2021-11-21 LAB — CK: Total CK: 4090 U/L — ABNORMAL HIGH (ref 49–397)

## 2021-11-21 LAB — CBC WITH DIFFERENTIAL/PLATELET
Abs Immature Granulocytes: 0.03 10*3/uL (ref 0.00–0.07)
Basophils Absolute: 0.1 10*3/uL (ref 0.0–0.1)
Basophils Relative: 1 %
Eosinophils Absolute: 0.3 10*3/uL (ref 0.0–0.5)
Eosinophils Relative: 3 %
HCT: 36.2 % — ABNORMAL LOW (ref 39.0–52.0)
Hemoglobin: 11.8 g/dL — ABNORMAL LOW (ref 13.0–17.0)
Immature Granulocytes: 0 %
Lymphocytes Relative: 19 %
Lymphs Abs: 1.7 10*3/uL (ref 0.7–4.0)
MCH: 32.8 pg (ref 26.0–34.0)
MCHC: 32.6 g/dL (ref 30.0–36.0)
MCV: 100.6 fL — ABNORMAL HIGH (ref 80.0–100.0)
Monocytes Absolute: 0.8 10*3/uL (ref 0.1–1.0)
Monocytes Relative: 9 %
Neutro Abs: 5.8 10*3/uL (ref 1.7–7.7)
Neutrophils Relative %: 68 %
Platelets: 78 10*3/uL — ABNORMAL LOW (ref 150–400)
RBC: 3.6 MIL/uL — ABNORMAL LOW (ref 4.22–5.81)
RDW: 14.7 % (ref 11.5–15.5)
WBC: 8.6 10*3/uL (ref 4.0–10.5)
nRBC: 0 % (ref 0.0–0.2)

## 2021-11-21 LAB — ANA W/REFLEX IF POSITIVE: Anti Nuclear Antibody (ANA): NEGATIVE

## 2021-11-21 LAB — GLUCOSE, CAPILLARY
Glucose-Capillary: 140 mg/dL — ABNORMAL HIGH (ref 70–99)
Glucose-Capillary: 153 mg/dL — ABNORMAL HIGH (ref 70–99)
Glucose-Capillary: 163 mg/dL — ABNORMAL HIGH (ref 70–99)
Glucose-Capillary: 164 mg/dL — ABNORMAL HIGH (ref 70–99)
Glucose-Capillary: 196 mg/dL — ABNORMAL HIGH (ref 70–99)
Glucose-Capillary: 199 mg/dL — ABNORMAL HIGH (ref 70–99)

## 2021-11-21 LAB — HEPATITIS B SURFACE ANTIBODY, QUANTITATIVE: Hep B S AB Quant (Post): <3.1 m[IU]/mL — ABNORMAL LOW (ref >9.9)

## 2021-11-21 LAB — HEPATITIS A ANTIBODY, IGM: Hep A IgM: NONREACTIVE

## 2021-11-21 LAB — HEPATITIS C ANTIBODY: HCV Ab: NONREACTIVE

## 2021-11-21 LAB — IGG, IGA, IGM
IgA: 507 mg/dL — ABNORMAL HIGH (ref 61–437)
IgG (Immunoglobin G), Serum: 1007 mg/dL (ref 603–1613)
IgM (Immunoglobulin M), Srm: 154 mg/dL (ref 20–172)

## 2021-11-21 LAB — ANTI-SMOOTH MUSCLE ANTIBODY, IGG: F-Actin IgG: 3 Units (ref 0–19)

## 2021-11-21 LAB — MITOCHONDRIAL ANTIBODIES: Mitochondrial M2 Ab, IgG: <20 Units (ref 0.0–20.0)

## 2021-11-21 LAB — HEPATITIS A ANTIBODY, TOTAL: hep A Total Ab: REACTIVE — AB

## 2021-11-21 MED ORDER — LACTULOSE 10 GM/15ML PO SOLN
5.0000 g | Freq: Every day | ORAL | Status: DC
Start: 2021-11-22 — End: 2021-11-22
  Administered 2021-11-22: 5 g via ORAL
  Filled 2021-11-21: qty 30

## 2021-11-21 MED ORDER — DEXTROSE-NACL 5-0.9 % IV SOLN: INTRAVENOUS | Status: AC

## 2021-11-21 NOTE — Evaluation (Signed)
Physical Therapy Evaluation Patient Details Name: Edward [name] MRN: [id number] DOB: [birth year] Today's Date: 11/21/2021  History of Present Illness  Edward [name]  is a 71 y.o. male with past medical history relevant for moderate thrombocytopenia, macrocytic anemia with superimposed iron deficiency, copper deficiency, CKD 3B, HLD, CAD, DM2 and HTN who presents to the ED with worsening fatigue, weakness, myalgias and malaise   Clinical Impression  Patient presents supine in bed and consents to PT evaluation. Patient is modified independent with bed mobility needing extended time with mild labored movement demonstrated. Good trunk control with ability to reach sitting position and scoot to EOB. Patient is modified independent with transfers and no AD. Fair coordination with mild balance deficits observed. Demonstrates appropriate strength needed for transfers. Patient able to ambulate 100 feet in hallway with min guard and no AD. Moderate gait deviations such as lack of B arm swing, excessive B LE ER, and wide base of support impacting gait pattern but able to ambulate safely. Patient able to navigate around objects in hallway without disruption to gait pattern and balance. Patient able to ambulate and transfer to bathroom independently indicating good return for home. Patient left in bathroom with nursing staff notified of mobility status. Patient will benefit from continued skilled physical therapy in hospital and recommended venue below to increase strength, balance, endurance for safe ADLs and gait.       Recommendations for follow up therapy are one component of a multi-disciplinary discharge planning process, led by the attending physician.  Recommendations may be updated based on patient status, additional functional criteria and insurance authorization.  Follow Up Recommendations Home health PT      Assistance Recommended at Discharge PRN  Patient can return home with the following  A  little help with walking and/or transfers;Help with stairs or ramp for entrance    Equipment Recommendations None recommended by PT  Recommendations for Other Services       Functional Status Assessment Patient has not had a recent decline in their functional status     Precautions / Restrictions Precautions Precautions: Fall Restrictions Weight Bearing Restrictions: No      Mobility  Bed Mobility Overal bed mobility: Modified Independent             General bed mobility comments: Modified independent with bed mobility. Extended time needed with mild labored movement. Good trunk control with ability to reach sitting position and scoot to EOB    Transfers Overall transfer level: Modified independent Equipment used: None               General transfer comment: Modified independent with transfers and no AD. Fair coordination with balance deficits observed.    Ambulation/Gait Ambulation/Gait assistance: Min guard Gait Distance (Feet): 100 Feet Assistive device: None Gait Pattern/deviations: Step-through pattern, Decreased step length - left, Decreased step length - right, Decreased stride length, Wide base of support, Decreased dorsiflexion - left, Decreased dorsiflexion - right Gait velocity: decreased     General Gait Details: Able to ambulate 100 feet in hallway with min guard and no AD. Moderate gait deviaitons such as lack of B arm swing, B LE ER and wide BOS impacting gait pattern but able to ambulate safely.  Stairs            Wheelchair Mobility    Modified Rankin (Stroke Patients Only)       Balance Overall balance assessment: Mild deficits observed, not formally tested  Pertinent Vitals/Pain Pain Assessment Pain Assessment: No/denies pain    Home Living Family/patient expects to be discharged to:: Private residence Living Arrangements: Alone Available Help at Discharge:  Family;Available PRN/intermittently Type of Home: House Home Access: Stairs to enter   Entrance Stairs-Number of Steps: 1   Home Layout: One level Home Equipment: Shower seat      Prior Function Prior Level of Function : Independent/Modified Independent             Mobility Comments: Patient reports being able to ambulate at home and in the community independently with no AD. Currently driving ADLs Comments: Patient reports being indepenent with ADL's.     Hand Dominance        Extremity/Trunk Assessment   Upper Extremity Assessment Upper Extremity Assessment: Overall WFL for tasks assessed    Lower Extremity Assessment Lower Extremity Assessment: Overall WFL for tasks assessed    Cervical / Trunk Assessment Cervical / Trunk Assessment: Normal  Communication   Communication: No difficulties  Cognition Arousal/Alertness: Awake/alert Behavior During Therapy: WFL for tasks assessed/performed Overall Cognitive Status: Within Functional Limits for tasks assessed                                          General Comments      Exercises     Assessment/Plan    PT Assessment Patient needs continued PT services  PT Problem List Decreased strength;Decreased activity tolerance;Decreased balance;Decreased mobility;Decreased coordination       PT Treatment Interventions DME instruction;Gait training;Functional mobility training;Therapeutic activities;Therapeutic exercise;Balance training    PT Goals (Current goals can be found in the Care Plan section)  Acute Rehab PT Goals Patient Stated Goal: return home PT Goal Formulation: With patient Time For Goal Achievement: 11/28/21 Potential to Achieve Goals: Good    Frequency Min 2X/week     Co-evaluation               AM-PAC PT "6 Clicks" Mobility  Outcome Measure Help needed turning from your back to your side while in a flat bed without using bedrails?: None Help needed moving from  lying on your back to sitting on the side of a flat bed without using bedrails?: A Little Help needed moving to and from a bed to a chair (including a wheelchair)?: A Little Help needed standing up from a chair using your arms (e.g., wheelchair or bedside chair)?: A Little Help needed to walk in hospital room?: A Little Help needed climbing 3-5 steps with a railing? : A Little 6 Click Score: 19    End of Session Equipment Utilized During Treatment: Gait belt Activity Tolerance: Patient tolerated treatment well;No increased pain;Patient limited by fatigue Patient left: in chair;with call bell/phone within reach Nurse Communication: Mobility status PT Visit Diagnosis: Unsteadiness on feet (R26.81);Muscle weakness (generalized) (M62.81);Other abnormalities of gait and mobility (R26.89)    Time: 7902-4097 PT Time Calculation (min) (ACUTE ONLY): 20 min   Charges:   PT Evaluation $PT Eval Moderate Complexity: 1 Mod PT Treatments $Therapeutic Activity: 8-22 mins        11:02 AM, 11/21/21 Lestine Box, S/PT

## 2021-11-21 NOTE — Progress Notes (Addendum)
Subjective: Has pressure behind his right eye like sinus pressure. Present for years without change. Little soreness across lower abdomen like bloating. Similar to yesterday, no change. Abdomen does seem to be more distended today and a little more tight. No nausea, vomiting. Tolerating diet well. Had 4 BMs yesterday. 2-3 BMs so far today. Stools are like diarrhea. Hasn't paid attention to see if there was any blood or black stools.   Still having SOB this morning. Maybe a little worse.  No confusion.   Feels weakness may be getting a little better.   Objective: Vital signs in last 24 hours: Temp:  [97.6 F (36.4 C)-98.5 F (36.9 C)] 97.9 F (36.6 C) (07/18 0542) Pulse Rate:  [64-83] 71 (07/18 0802) Resp:  [18-21] 21 (07/18 0542) BP: (123-137)/(60-77) 128/60 (07/18 0802) SpO2:  [96 %-98 %] 98 % (07/18 0542) Weight:  [79.7 kg] 79.7 kg (07/18 0420) Last BM Date : 11/21/21 General:   Alert and oriented, pleasant, in bedside recliner.  Head:  Normocephalic and atraumatic. Eyes:  No icterus, sclera clear. Conjuctiva pink.  Lungs: Slight crackles in the lung bases. No wheezing or rhonchi.  Abdomen:  Bowel sounds present. Abdomen is full/distended and mildly tense.  Bruising along lower abdomen in area of heparin injections with mild tenderness in these areas. No rebound or guarding. No masses appreciated  Msk:  Symmetrical without gross deformities. Normal posture. Extremities:  With 2+ pedal edema and 1+ edema up to mid shin. Compression stockings in place.  Neurologic:  Alert and  oriented x4;  grossly normal neurologically. No asterixis.  Skin:  Warm and dry, intact without significant lesions.  Psych:  Normal mood and affect.  Intake/Output from previous day: 07/17 0701 - 07/18 0700 In: 1897.3 [I.V.:1697.1; IV Piggyback:200.1] Out: 2 [Urine:1; Stool:1] Intake/Output this shift: Total I/O In: 200 [P.O.:200] Out: -   Lab Results: Recent Labs    11/19/21 0451  11/20/21 0441 11/21/21 0415  WBC 6.3 6.0 8.6  HGB 11.5* 10.6* 11.8*  HCT 33.9* 31.8* 36.2*  PLT 58* 57* 78*   BMET Recent Labs    11/19/21 0451 11/20/21 0441 11/21/21 0415  NA 138 140 136  K 3.5 3.9 3.6  CL 106 109 106  CO2 24 24 23   GLUCOSE 44* 93 179*  BUN 30* 31* 32*  CREATININE 1.60* 1.53* 1.50*  CALCIUM 8.3* 8.3* 8.2*   LFT Recent Labs    11/19/21 0451 11/20/21 0441 11/20/21 1633 11/21/21 0415  PROT 5.3* 5.1*  --  5.9*  ALBUMIN 2.4* 2.2*  --  2.5*  AST 290* 260*  --  244*  ALT 127* 127*  --  148*  ALKPHOS 49 44  --  53  BILITOT 2.8* 2.1* 2.9* 2.1*  BILIDIR  --   --  1.0*  --   IBILI  --   --  1.9*  --    PT/INR Recent Labs    11/20/21 0441 11/21/21 0415  LABPROT 18.2* 17.7*  INR 1.5* 1.5*   Hepatitis Panel Recent Labs    11/20/21 1633  HEPBSAG NON REACTIVE  HCVAB NON REACTIVE  HEPAIGM NON REACTIVE   Studies/Results: NM Hepato W/EF  Result Date: 11/20/2021 CLINICAL DATA:  Right upper quadrant abdominal pain x1 month, nondiagnostic ultrasound. EXAM: NUCLEAR MEDICINE HEPATOBILIARY IMAGING WITH GALLBLADDER EF TECHNIQUE: Sequential images of the abdomen were obtained out to 60 minutes following intravenous administration of radiopharmaceutical. After slow intravenous infusion of 1.5 micrograms Cholecystokinin, gallbladder ejection fraction was determined. RADIOPHARMACEUTICALS:  5.5 mCi Tc-41mCholetec IV COMPARISON:  Ultrasound November 18, 2021 FINDINGS: Delayed blood pool activity with delayed clearance of radiotracer from the liver. Prompt uptake and biliary excretion of activity by the liver is seen. Gallbladder activity is visualized, consistent with patency of cystic duct. Biliary activity passes into small bowel, consistent with patent common bile duct. There is an initial drop in radiotracer activity post CCK administration however there is a subsequent increase in activity towards the end of scintigraphic sequences. In the setting of delayed clearance of  radiotracer from the liver. Calculated gallbladder ejection fraction of -56% (at 60 min, normal ejection fraction is greater than 40%.) IMPRESSION: 1. Delayed clearance of radiotracer from blood pool and the liver suggestive of hepatitis, which limits the utility of the study. 2. Reduced gallbladder ejection fraction, which is at least somewhat related to the delayed radiotracer clearance from the liver but is at least suggestive of chronic cholecystitis/biliary dyskinesia. Electronically Signed   By: JDahlia BailiffM.D.   On: 11/20/2021 13:06    Assessment: 71y.o. year old male with past medical history significant for moderate thrombocytopenia, macrocytic anemia, copper deficiency following with hematology, CKD, HLD, CAD, DM 2, HTN, found to have elevated CK, possible calculus cholecystitis, elevated LFTs and cirrhosis.  Elevated LFTs:  Etiology unclear. History of mildly elevated LFTs in the latter part of 2022, but normalized in January and had been normal until 7/11 when he was found to have AST 288, ALT 93, total bilirubin 2.8, alk phos normal.  Persistent elevation this admission. AST coming down slightly at 244 today. ALT increasing at 148 today. T bili stable/down slightly at 2.1 today. Notably, yesterday bilirubin was fractionated and both direct and indirect bilirubin were elevated at 1.0 and 1.9 respectively. UKoreashows cirrhosis which is a new diagnosis. Also with cholelithiasis on imaging, HIDA with patent CBD and cystic duct, no acute cholecystitis, but demonstrated biliary dyskinesia/chronic cholecystitis. Denies any new medications, alcohol use, illicit drug use, routine Tylenol use, OTC supplements aside from copper, family history of liver disease or autoimmune conditions.  Chronically on atorvastatin, currently on hold. Recent iron panel wnl. Viral hepatitis negative.  ANA, IgG within normal limits.  Waiting on additional AIH serologies.  Wilsons disease has been in question outpatient due  to low ceruloplasmin and elevated 24 hr urine copper (while on copper supplementation). He will need slit-lamp with ophthalmology to rule out KSunTrustrings. Suspect elevated CK/rhabdomyolysis is contributing to elevated LFTs. Ultimately may need to consider liver biopsy if work-up is unrevealing.   Cirrhosis:  New diagnosis this admission. Ultrasound in August 2022 with normal appearing liver. MELD NA 19. Child Pugh C. Denies alcohol use.  Wilson's disease has been in question outpatient the setting of low ceruloplasmin and elevated 24 hr urine copper (while on copper supplementation). He will need slit-lamp with ophthalmology to rule out KSunTrustrings. We have ordered additional serologies this admission. Viral hepatitis ruled out. ANA, and IgG wnl, additional AIH serologies pending. May need to consider liver biopsy if work-up is unrevealing.  Cirrhosis is complicated by ascites which has increased today with mildly tense abdomen, likely influenced by IV hydration. Also with 1+ pedal edema and 1+ edema up to mid shin. Portal vein appears patent on UKoreathis admission. Also with coagulopathy with thrombocytopenia and elevated INR which is stable at 1.5 today. No symptoms of HE, but ammonia slightly elevated at 49 on 7/16, and patient was started on lactulose, currently having about 4 BM  daily.   We will plan for paracentesis today and decrease lactulose with a goal of 2-3 BMs daily. He will need to be started on low dose diuretics, but will hold off in the setting of elevated CK with need for IV hydration.   Cholelithiasis with question of cholecystitis: Gallbladder wall thickening noted on ultrasound suggesting cholecystitis. HIDA with patent cystic and common bile duct, no acute cholelithiasis, but did demonstrate/suggest chronic cholecystitis/biliary dyskinesia.  Patient is clinically asymptomatic. General surgery has been following and is not recommending any sort of surgical intervention  at this time.  If patient has issue in the future, they recommended cholecystostomy tube by IR given significant perioperative mortality risk. Surgery has recommended discontinuing empiric antibiotics.   Esophageal wall thickening:  Mildly patulous and diffuse thickened esophagus without discrete esophageal mass noted on CT A/P without contrast.  No prior EGD.  Denies heartburn or dysphagia symptoms. No significant weight loss. Will need non-emergent outpatient EGD for further evaluation.   Anemia:  Chronic mild normocytic anemia with baseline hemoglobin in the 11-12 range, following with hematology.  Per chart review, no evidence of iron deficiency, B12, or folate deficiency. Had previously been taking iron supplement, but discontinued it, "a while ago". Hemoglobin 10.4 on admission, down from 12.2, 6 days prior. Hgb improved to 11.8 today. Denies overt GI bleeding.  Will continue to monitor. Reports colonoscopy about 10 years ago with [facility] with 2 polyps.  No prior EGD.  SOB:  Patient complaining of some increased SOB today. Stating 98 % on room air this morning.  No acute findings on chest CT 7/15. Crackles in lung bases on exam today. Will repeat CXR today.    Plan: CXR US paracentesis. Max 4L. Will obtain labs with this.  He will need to be started on low dose diuretics at some point, but will hold off in the setting of elevated CK with need for IV hydration.  2g sodium diet.  Decrease lactulose to 5g daily with goal of 2-3 BMs daily.  Continue to follow CBC, CMP, INR daily.  Follow-up on pending serologies.  Consider liver biopsy if work-up is unrevealing.  Avoid hepatotoxic medications.  Outpatient EGD for variceal screening as well to evaluate esophageal wall thickening. Outpatient follow-up for cirrhosis management. Outpatient slit lamp evaluation by ophthalmologist to evaluate for [facility] rings.   LOS: 3 days    11/21/2021, 10:53 AM   Aliene Altes,  PA-C [facility]

## 2021-11-21 NOTE — Plan of Care (Signed)
  Problem: Acute Rehab PT Goals(only PT should resolve) Goal: Pt Will Go Supine/Side To Sit Outcome: Progressing Flowsheets (Taken 11/21/2021 1102) Pt will go Supine/Side to Sit:  with supervision  with modified independence Goal: Patient Will Transfer Sit To/From Stand Outcome: Progressing Flowsheets (Taken 11/21/2021 1102) Patient will transfer sit to/from stand:  with modified independence  with supervision Goal: Pt Will Transfer Bed To Chair/Chair To Bed Outcome: Progressing Flowsheets (Taken 11/21/2021 1102) Pt will Transfer Bed to Chair/Chair to Bed:  with modified independence  with supervision Goal: Pt Will Perform Standing Balance Or Pre-Gait Outcome: Progressing Flowsheets (Taken 11/21/2021 1102) Pt will perform standing balance or pre-gait:  with minimal assist  with moderate assist  3- 5 min  with no UE support Goal: Pt Will Ambulate Outcome: Progressing Flowsheets (Taken 11/21/2021 1102) Pt will Ambulate:  with min guard assist  with supervision  > 125 feet   11:04 AM, 11/21/21 Lestine Box, S/PT

## 2021-11-21 NOTE — Progress Notes (Addendum)
PROGRESS NOTE     Edward [name], is a 71 y.o. male, DOB - 1950-08-22, YQI:[id number]  Admit date - 11/18/2021   Admitting Physician Peightyn Roberson Denton Brick, MD  Outpatient Primary MD for the patient is [name], [place]  LOS - 3  Chief Complaint  Patient presents with   Multiple complaints        Brief Narrative:  71 y.o. male with past medical history relevant for moderate thrombocytopenia, macrocytic anemia with superimposed iron deficiency, copper deficiency, CKD 3B, HLD, CAD, DM2 and HTN admitted with possible Calculus Cholecystitis with Elevated LFTs and Liver Cirrhosis  -Assessment and Plan: 1)Possible Acute Calculus Cholecystitis with Elevated LFTs and Liver Cirrhosis---currently no significant abd pain - CT abdomen and pelvis and right upper quadrant ultrasound consistent with gallstones and cholecystitis -Surgical consult appreciated -Gi consult appreciated Stopped IV Rocephin and Flagyl on 11/21/2021 -As needed pain medication and antiemetics HIDA scan with cystic duct patency noted-so no need for IR cholecystectomy tube -May need liver biopsy as outpatient -EGD as outpatient to rule out varices -Ultrasound from 11/21/2021 reveals not enough fluid for paracentesis  2)Liver Cirrhosis with Ascites and Portal Hypertension---??? Etiology -No significant alcohol use -INR 1.4, bilirubin is 3.1 -Random Urine Copper high at 72 (09/12/21) -PTA Patient was on atorvastatin and copper supplements -??? Wilson's disease in the setting of low copper and low ceruloplasmin.  Low ceruloplasmin may also be from copper deficiency-- may need ophthalmology for slit lamp exam to rule out [employer] rings. -Viral hepatitis profile was negative (negative for hep C and hep B may have had hep A infection in the past with antibodies noted) -Ammonia was 49--decrease  lactulose to 5 g daily to avoid excessive stools -ANA, mitochondrial antibodies, anti-smooth antibodies, IgG IgA and IgM  requested by GI service pending -Would benefit from EGD and liver biopsy as outpatient as noted above #1 defer to GI service - 3)History of copper deficiency--- has been on copper replacement for over 1 year -Random Urine Copper high at 72 (09/12/21) -Hold copper tablets as copper toxicity can lead to hepatic dysfunction -Discussed with patient's hematologist Dr. Delton Coombes--- liver biopsy will be able to tell if patient has copper toxicity or not   4)Elevated LFTs/elevated CPK -CT abdomen and pelvis and right upper quadrant ultrasound consistent with gallstone cholecystitis -Elevated CPK noted----Total CPK 6902>>5,817>>4,090 -Elevated LFTs including bilirubin noted -Elevated urine copper levels noted -Stopped copper supplements and Stop atorvastatin   5)Anemia and Thrombocytopenia--- with macrocytosis --diagnosed in May 2021 -- SPEP in June 2021 was normal.  Erythropoietin normal at 11.9. -Previously declined bone marrow biopsy for evaluation of possible early MDS --Hgb 10.4>>11.5 - Platelets 52>>58 -Previously treated with copper supplements -Prior work-up revealed somewhat low iron --- B12 and Folate were Not low   6)AKI----acute kidney injury on CKD stage -3B ---Creatinine on admission was 2.02 -baseline---  1.5 to 1.6 -Creatinine is down to 1.50 -Suspect due to dehydration -renally adjust medications, avoid nephrotoxic agents / dehydration  / hypotension -Patient usually follows with Dr. Theador Hawthorne   7)Hyponatremia------ suspect dehydration related,  -sodium 134>>136 --avoid excessive free water   8)DM2---  A1c- 6.7 on 11/14/2021.[name]  Reflecting good diabetic control PTA Use Novolog/Humalog Sliding scale insulin with Accu-Cheks/Fingersticks as ordered    9)-Esophagitis--- CT chest shows -Patulous and thickened esophagus. -Protonix as ordered EGD as outpatient to be done as noted above #1   10)Social/ethics--- Discussed with patient's ex-wife and son [name] at bedside -Patient  is a full code  11)HypoGlycemia----due to poor  oral intake in setting of underlying liver cirrhosis  -feed liberally and frequently   12) generalized weakness/malaise--- CPK is  elevated, PT eval appreciated recommends home health  Disposition/Need for in-Hospital Stay- patient unable to be discharged at this time due to -gallstones with cholecystitis and elevated LFTs with cirrhosis requiring IV antibiotics and further investigation* -Possible discharge home in 1 to 2 days if CPK and LFTs levels improve   Status is: Inpatient   Remains inpatient appropriate because:    Dispo: The patient is from: Home              Anticipated d/c is to: Home              Anticipated d/c date is: 1 days              Patient currently is not medically stable to d/c. Barriers: Not Clinically Stable-   Code Status :  -  Code Status: Full Code   Family Communication:    (patient is alert, awake and coherent)  Discussed with patient's ex- wife and patient's son [name]  DVT Prophylaxis  :   - SCDs   heparin injection 5,000 Units Start: 11/18/21 1515 SCDs Start: 11/18/21 1420 Place TED hose Start: 11/18/21 1420   Lab Results  Component Value Date   PLT 78 (L) 11/21/2021    Inpatient Medications  Scheduled Meds:  aspirin  81 mg Oral Daily   feeding supplement  237 mL Oral BID BM   ferrous sulfate  325 mg Oral Q breakfast   heparin  5,000 Units Subcutaneous Q8H   insulin aspart  0-5 Units Subcutaneous QHS   insulin aspart  0-6 Units Subcutaneous TID WC   [START ON 11/22/2021] lactulose  5 g Oral Daily   metoprolol tartrate  12.5 mg Oral BID   pantoprazole  40 mg Oral Daily   sodium bicarbonate  650 mg Oral TID   sodium chloride flush  3 mL Intravenous Q12H   sodium chloride flush  3 mL Intravenous Q12H   vitamin B-12  1,000 mcg Oral Daily   Continuous Infusions:  sodium chloride     cefTRIAXone (ROCEPHIN)  IV 2 g (11/21/21 1436)   dextrose 5 % and 0.9% NaCl 100 mL/hr at 11/21/21 0528    metronidazole 500 mg (11/21/21 1619)   PRN Meds:.sodium chloride, acetaminophen **OR** acetaminophen, bisacodyl, ondansetron **OR** ondansetron (ZOFRAN) IV, polyethylene glycol, sodium chloride flush, traZODone   Anti-infectives (From admission, onward)    Start     Dose/Rate Route Frequency Ordered Stop   11/18/21 1600  metroNIDAZOLE (FLAGYL) IVPB 500 mg        500 mg 100 mL/hr over 60 Minutes Intravenous Every 12 hours 11/18/21 1430     11/18/21 1530  cefTRIAXone (ROCEPHIN) 2 g in sodium chloride 0.9 % 100 mL IVPB        2 g 200 mL/hr over 30 Minutes Intravenous Every 24 hours 11/18/21 1430           Subjective: [name] today has no fevers, no emesis,  No chest pain,   -- Having loose stools from lactulose -No abdominal pain   Objective: Vitals:   11/21/21 0542 11/21/21 0802 11/21/21 1300 11/21/21 1323  BP: 137/67 128/60 122/61 128/66  Pulse: 71 71 64 66  Resp: (!) 21     Temp: 97.9 F (36.6 C)  97.6 F (36.4 C) 97.9 F (36.6 C)  TempSrc: Oral  Oral Oral  SpO2: 98%  100% 99%  Weight:      Height:        Intake/Output Summary (Last 24 hours) at 11/21/2021 2115 Last data filed at 11/21/2021 1700 Gross per 24 hour  Intake 1707.8 ml  Output --  Net 1707.8 ml   Filed Weights   11/19/21 0500 11/20/21 0552 11/21/21 0420  Weight: 74.6 kg 76.5 kg 79.7 kg    Physical Exam  Gen:- Awake Alert,  in no apparent distress  HEENT:- [place].AT, mild sclera icterus Neck-Supple Neck,No JVD,.  Lungs-  CTAB , fair symmetrical air movement CV- S1, S2 normal, regular  Abd-  +ve B.Sounds, soft, ND,+BS, no significant abdominal tenderness, negative Murphy sign, no rebound or guarding Extremity/Skin:- No  edema, pedal pulses present  Psych-affect is appropriate, oriented x3 Neuro-generalized weakness, no new focal deficits, no tremors  Data Reviewed: I have personally reviewed following labs and imaging studies  CBC: Recent Labs  Lab 11/18/21 0644 11/19/21 0451  11/20/21 0441 11/21/21 0415  WBC 5.1 6.3 6.0 8.6  NEUTROABS  --   --   --  5.8  HGB 10.4* 11.5* 10.6* 11.8*  HCT 30.3* 33.9* 31.8* 36.2*  MCV 97.1 96.9 98.8 100.6*  PLT 52* 58* 57* 78*   Basic Metabolic Panel: Recent Labs  Lab 11/18/21 0644 11/19/21 0451 11/20/21 0441 11/21/21 0415  NA 134* 138 140 136  K 3.7 3.5 3.9 3.6  CL 99 106 109 106  CO2 _0 GLUCOSE 79 44* 93 179*  BUN 34* 30* 31* 32*  CREATININE 2.02* 1.60* 1.53* 1.50*  CALCIUM 8.2* 8.3* 8.3* 8.2*  MG 2.1  --   --   --    GFR: Estimated Creatinine Clearance: 48 mL/min (A) (by C-G formula based on SCr of 1.5 mg/dL (H)). Liver Function Tests: Recent Labs  Lab 11/18/21 0644 11/19/21 0451 11/20/21 0441 11/20/21 1633 11/21/21 0415  AST 274* 290* 260*  --  244*  ALT 120* 127* 127*  --  148*  ALKPHOS 50 49 44  --  53  BILITOT 3.1* 2.8* 2.1* 2.9* 2.1*  PROT 5.3* 5.3* 5.1*  --  5.9*  ALBUMIN 2.4* 2.4* 2.2*  --  2.5*   Cardiac Enzymes: Recent Labs  Lab 11/18/21 0644 11/19/21 0451 11/21/21 0415  CKTOTAL 6,902* 5,817* 4,090*    Radiology Studies: DG Chest 2 View  Result Date: 11/21/2021 CLINICAL DATA:  Shortness of breath and weakness. EXAM: CHEST - 2 VIEW COMPARISON:  Chest two views 09/14/2013, CT chest 11/18/2021 FINDINGS: Cardiac and mediastinal contours are within limits. Interval decrease in bilateral lung volumes compared to 09/14/2013 radiographs, however this is likely similar to 11/18/2021 CT. There are bibasilar opacities including horizontal linear septal thickening that was seen on prior CT. Minimal blunting of the bilateral costophrenic angles raises the question of tiny bilateral pleural effusions, however none were seen on recent CT and this may be secondary to the peripheral inferior pulmonary fibrosis. No pneumothorax. Moderate multilevel degenerative disc changes of the midthoracic spine. IMPRESSION: Decreased lung volumes with bibasilar interlobular septal thickening corresponding to  the mild interstitial pulmonary fibrosis seen on recent CT. Minimal blunting of the bilateral costophrenic angles raises the question of tiny bilateral pleural effusions, however as no pleural effusions were seen on recent CT, this may reflect the chronic interstitial scarring. Electronically Signed   By: Yvonne Kendall M.D.   On: 11/21/2021 12:58   [place] ASCITES (ABDOMEN LIMITED)  Result Date: 11/21/2021 CLINICAL DATA:  Elevated liver function tests.  Ascites. EXAM: LIMITED  ABDOMEN ULTRASOUND FOR ASCITES TECHNIQUE: Limited ultrasound survey for ascites was performed in all four abdominal quadrants. COMPARISON:  11/18/2021 FINDINGS: Small amount of ascites is present in this patient with morphologic findings of cirrhosis. The amount of ascites is currently felt to be below the volume threshold at which paracentesis would yield a significant therapeutic benefit. IMPRESSION: 1. Small amount of scattered ascites in the abdomen.  Cirrhosis. Electronically Signed   By: Van Clines M.D.   On: 11/21/2021 12:05   NM Hepato W/EF  Result Date: 11/20/2021 CLINICAL DATA:  Right upper quadrant abdominal pain x1 month, nondiagnostic ultrasound. EXAM: NUCLEAR MEDICINE HEPATOBILIARY IMAGING WITH GALLBLADDER EF TECHNIQUE: Sequential images of the abdomen were obtained out to 60 minutes following intravenous administration of radiopharmaceutical. After slow intravenous infusion of 1.5 micrograms Cholecystokinin, gallbladder ejection fraction was determined. RADIOPHARMACEUTICALS:  5.5 mCi Tc-73mCholetec IV COMPARISON:  Ultrasound November 18, 2021 FINDINGS: Delayed blood pool activity with delayed clearance of radiotracer from the liver. Prompt uptake and biliary excretion of activity by the liver is seen. Gallbladder activity is visualized, consistent with patency of cystic duct. Biliary activity passes into small bowel, consistent with patent common bile duct. There is an initial drop in radiotracer activity post CCK  administration however there is a subsequent increase in activity towards the end of scintigraphic sequences. In the setting of delayed clearance of radiotracer from the liver. Calculated gallbladder ejection fraction of -56% (at 60 min, normal ejection fraction is greater than 40%.) IMPRESSION: 1. Delayed clearance of radiotracer from blood pool and the liver suggestive of hepatitis, which limits the utility of the study. 2. Reduced gallbladder ejection fraction, which is at least somewhat related to the delayed radiotracer clearance from the liver but is at least suggestive of chronic cholecystitis/biliary dyskinesia. Electronically Signed   By: JDahlia BailiffM.D.   On: 11/20/2021 13:06     Scheduled Meds:  aspirin  81 mg Oral Daily   feeding supplement  237 mL Oral BID BM   ferrous sulfate  325 mg Oral Q breakfast   heparin  5,000 Units Subcutaneous Q8H   insulin aspart  0-5 Units Subcutaneous QHS   insulin aspart  0-6 Units Subcutaneous TID WC   [START ON 11/22/2021] lactulose  5 g Oral Daily   metoprolol tartrate  12.5 mg Oral BID   pantoprazole  40 mg Oral Daily   sodium bicarbonate  650 mg Oral TID   sodium chloride flush  3 mL Intravenous Q12H   sodium chloride flush  3 mL Intravenous Q12H   vitamin B-12  1,000 mcg Oral Daily   Continuous Infusions:  sodium chloride     cefTRIAXone (ROCEPHIN)  IV 2 g (11/21/21 1436)   dextrose 5 % and 0.9% NaCl 100 mL/hr at 11/21/21 0528   metronidazole 500 mg (11/21/21 1619)    LOS: 3 days   CRoxan HockeyM.D on 11/21/2021 at 9:15 PM  Go to [email or web address] - for contact info  [facility] - Office  [phone] If 7PM-7AM, please contact night-coverage [email or web address] 11/21/2021, 9:15 PM

## 2021-11-21 NOTE — Progress Notes (Signed)
[facility] Progress Note     Subjective: Patient seen and examined.  He is resting comfortably in bed.  He complains of abdominal distention and slightly feeling uncomfortable.  He denies postprandial abdominal pain, nausea, and vomiting.  Objective: Vital signs in last 24 hours: Temp:  [97.6 F (36.4 C)-98.5 F (36.9 C)] 97.9 F (36.6 C) (07/18 0542) Pulse Rate:  [64-83] 71 (07/18 0802) Resp:  [18-21] 21 (07/18 0542) BP: (123-137)/(60-77) 128/60 (07/18 0802) SpO2:  [96 %-98 %] 98 % (07/18 0542) Weight:  [79.7 kg] 79.7 kg (07/18 0420) Last BM Date : 11/21/21  Intake/Output from previous day: 07/17 0701 - 07/18 0700 In: 1897.3 [I.V.:1697.1; IV Piggyback:200.1] Out: 2 [Urine:1; Stool:1] Intake/Output this shift: Total I/O In: 200 [P.O.:200] Out: -   General appearance: alert, cooperative, and no distress GI: Abdomen soft, moderate distention, nontender to percussion, no tenderness to palpation; no rigidity, guarding, or rebound tenderness, negative Murphy's  Lab Results:  Recent Labs    11/20/21 0441 11/21/21 0415  WBC 6.0 8.6  HGB 10.6* 11.8*  HCT 31.8* 36.2*  PLT 57* 78*   BMET Recent Labs    11/20/21 0441 11/21/21 0415  NA 140 136  K 3.9 3.6  CL 109 106  CO2 24 23  GLUCOSE 93 179*  BUN 31* 32*  CREATININE 1.53* 1.50*  CALCIUM 8.3* 8.2*   PT/INR Recent Labs    11/20/21 0441 11/21/21 0415  LABPROT 18.2* 17.7*  INR 1.5* 1.5*    Studies/Results: NM Hepato W/EF  Result Date: 11/20/2021 CLINICAL DATA:  Right upper quadrant abdominal pain x1 month, nondiagnostic ultrasound. EXAM: NUCLEAR MEDICINE HEPATOBILIARY IMAGING WITH GALLBLADDER EF TECHNIQUE: Sequential images of the abdomen were obtained out to 60 minutes following intravenous administration of radiopharmaceutical. After slow intravenous infusion of 1.5 micrograms Cholecystokinin, gallbladder ejection fraction was determined. RADIOPHARMACEUTICALS:  5.5 mCi Tc-75mCholetec IV  COMPARISON:  Ultrasound November 18, 2021 FINDINGS: Delayed blood pool activity with delayed clearance of radiotracer from the liver. Prompt uptake and biliary excretion of activity by the liver is seen. Gallbladder activity is visualized, consistent with patency of cystic duct. Biliary activity passes into small bowel, consistent with patent common bile duct. There is an initial drop in radiotracer activity post CCK administration however there is a subsequent increase in activity towards the end of scintigraphic sequences. In the setting of delayed clearance of radiotracer from the liver. Calculated gallbladder ejection fraction of -56% (at 60 min, normal ejection fraction is greater than 40%.) IMPRESSION: 1. Delayed clearance of radiotracer from blood pool and the liver suggestive of hepatitis, which limits the utility of the study. 2. Reduced gallbladder ejection fraction, which is at least somewhat related to the delayed radiotracer clearance from the liver but is at least suggestive of chronic cholecystitis/biliary dyskinesia. Electronically Signed   By: JDahlia BailiffM.D.   On: 11/20/2021 13:06    Anti-infectives: Anti-infectives (From admission, onward)    Start     Dose/Rate Route Frequency Ordered Stop   11/18/21 1600  metroNIDAZOLE (FLAGYL) IVPB 500 mg        500 mg 100 mL/hr over 60 Minutes Intravenous Every 12 hours 11/18/21 1430     11/18/21 1530  cefTRIAXone (ROCEPHIN) 2 g in sodium chloride 0.9 % 100 mL IVPB        2 g 200 mL/hr over 30 Minutes Intravenous Every 24 hours 11/18/21 1430         Assessment/Plan:  Patient is a 71year old male who was admitted  with elevated LFTs, newly diagnosed cirrhosis, and concern for cholecystitis.  -HIDA scan without concern for acute cholecystitis.  Did demonstrate biliary dyskinesia/chronic cholecystitis -Given that the patient is currently asymptomatic with no postprandial abdominal pain, nausea, or vomiting, recommend against any intervention  at this time.  If percutaneous cholecystostomy tube were to be placed, it would likely need to be in place for a long period of time/indefinitely given his perioperative risk of mortality. -May discontinue Rocephin and Flagyl given no concern for acute cholecystitis on HIDA -If patient has an issue in the future, would recommend cholecystostomy tube placement by IR given his significant perioperative mortality risk with abdominal surgery (>80%) -Okay for diet from general surgery standpoint -Appreciate GI recommendations -Care per primary team -Please call with any questions or concerns   LOS: 3 days    Less [place] A Bhavika Schnider 11/21/2021

## 2021-11-22 ENCOUNTER — Telehealth (INDEPENDENT_AMBULATORY_CARE_PROVIDER_SITE_OTHER): Payer: Self-pay | Admitting: Gastroenterology

## 2021-11-22 DIAGNOSIS — K810 Acute cholecystitis: Secondary | ICD-10-CM | POA: Diagnosis not present

## 2021-11-22 DIAGNOSIS — D509 Iron deficiency anemia, unspecified: Secondary | ICD-10-CM | POA: Diagnosis not present

## 2021-11-22 DIAGNOSIS — K7460 Unspecified cirrhosis of liver: Secondary | ICD-10-CM | POA: Diagnosis not present

## 2021-11-22 DIAGNOSIS — I25119 Atherosclerotic heart disease of native coronary artery with unspecified angina pectoris: Secondary | ICD-10-CM

## 2021-11-22 DIAGNOSIS — K219 Gastro-esophageal reflux disease without esophagitis: Secondary | ICD-10-CM

## 2021-11-22 DIAGNOSIS — D696 Thrombocytopenia, unspecified: Secondary | ICD-10-CM

## 2021-11-22 DIAGNOSIS — E782 Mixed hyperlipidemia: Secondary | ICD-10-CM

## 2021-11-22 DIAGNOSIS — R188 Other ascites: Secondary | ICD-10-CM

## 2021-11-22 DIAGNOSIS — R7989 Other specified abnormal findings of blood chemistry: Secondary | ICD-10-CM

## 2021-11-22 DIAGNOSIS — K811 Chronic cholecystitis: Secondary | ICD-10-CM

## 2021-11-22 DIAGNOSIS — N1831 Chronic kidney disease, stage 3a: Secondary | ICD-10-CM | POA: Diagnosis not present

## 2021-11-22 DIAGNOSIS — E1121 Type 2 diabetes mellitus with diabetic nephropathy: Secondary | ICD-10-CM

## 2021-11-22 DIAGNOSIS — I10 Essential (primary) hypertension: Secondary | ICD-10-CM

## 2021-11-22 LAB — COMPREHENSIVE METABOLIC PANEL
ALT: 137 U/L — ABNORMAL HIGH (ref 0–44)
AST: 176 U/L — ABNORMAL HIGH (ref 15–41)
Albumin: 2.4 g/dL — ABNORMAL LOW (ref 3.5–5.0)
Alkaline Phosphatase: 52 U/L (ref 38–126)
Anion gap: 6 (ref 5–15)
BUN: 30 mg/dL — ABNORMAL HIGH (ref 8–23)
CO2: 23 mmol/L (ref 22–32)
Calcium: 8.5 mg/dL — ABNORMAL LOW (ref 8.9–10.3)
Chloride: 109 mmol/L (ref 98–111)
Creatinine, Ser: 1.27 mg/dL — ABNORMAL HIGH (ref 0.61–1.24)
GFR, Estimated: >60 mL/min (ref >60)
Glucose, Bld: 126 mg/dL — ABNORMAL HIGH (ref 70–99)
Potassium: 4.2 mmol/L (ref 3.5–5.1)
Sodium: 138 mmol/L (ref 135–145)
Total Bilirubin: 2.4 mg/dL — ABNORMAL HIGH (ref 0.3–1.2)
Total Protein: 5.6 g/dL — ABNORMAL LOW (ref 6.5–8.1)

## 2021-11-22 LAB — CK: Total CK: 2192 U/L — ABNORMAL HIGH (ref 49–397)

## 2021-11-22 LAB — GLUCOSE, CAPILLARY
Glucose-Capillary: 128 mg/dL — ABNORMAL HIGH (ref 70–99)
Glucose-Capillary: 153 mg/dL — ABNORMAL HIGH (ref 70–99)

## 2021-11-22 LAB — CBC
HCT: 35.9 % — ABNORMAL LOW (ref 39.0–52.0)
Hemoglobin: 11.7 g/dL — ABNORMAL LOW (ref 13.0–17.0)
MCH: 32.5 pg (ref 26.0–34.0)
MCHC: 32.6 g/dL (ref 30.0–36.0)
MCV: 99.7 fL (ref 80.0–100.0)
Platelets: 80 10*3/uL — ABNORMAL LOW (ref 150–400)
RBC: 3.6 MIL/uL — ABNORMAL LOW (ref 4.22–5.81)
RDW: 14.7 % (ref 11.5–15.5)
WBC: 7.8 10*3/uL (ref 4.0–10.5)
nRBC: 0 % (ref 0.0–0.2)

## 2021-11-22 LAB — PROTIME-INR
INR: 1.5 — ABNORMAL HIGH (ref 0.8–1.2)
Prothrombin Time: 18.1 seconds — ABNORMAL HIGH (ref 11.4–15.2)

## 2021-11-22 LAB — COPPER, SERUM: Copper: 57 ug/dL — ABNORMAL LOW (ref 69–132)

## 2021-11-22 MED ORDER — LACTULOSE 10 GM/15ML PO SOLN: 5.0000 g | Freq: Every day | ORAL | 0 refills | Status: DC

## 2021-11-22 MED ORDER — SODIUM BICARBONATE 650 MG PO TABS: 650.0000 mg | ORAL_TABLET | Freq: Three times a day (TID) | ORAL | 2 refills | Status: AC

## 2021-11-22 MED ORDER — ENSURE ENLIVE PO LIQD: 237.0000 mL | Freq: Two times a day (BID) | ORAL | Status: DC

## 2021-11-22 MED ORDER — FERROUS SULFATE 325 (65 FE) MG PO TABS: 325.0000 mg | ORAL_TABLET | Freq: Every day | ORAL | 3 refills | Status: DC

## 2021-11-22 MED ORDER — POLYETHYLENE GLYCOL 3350 17 G PO PACK: 17.0000 g | PACK | Freq: Every day | ORAL | 1 refills | Status: DC | PRN

## 2021-11-22 NOTE — Discharge Summary (Signed)
Physician Discharge Summary   Patient: Edward [name] MRN: [id number] DOB: 10-29-1950  Admit date:     11/18/2021  Discharge date: 11/22/21  Discharge Physician: Edward [name]   PCP: Edward Pretty, FNP   Recommendations at discharge:  Repeat complete metabolic panel to follow electrolytes, renal function and LFTs. Repeat CBC to follow hemoglobin trend/stability Make sure patient has follow-up with gastroenterology as instructed Reassess blood pressure and adjust antihypertensive treatment as required. For now continue holding on his statins due to transaminitis. Continue goals of care and advance care planning discussions.  Discharge Diagnoses: Principal Problem:   Acute Calculous cholecystitis with Elevated LFTs Active Problems:   Abnormal liver function tests   CKD (chronic kidney disease) stage 3B, GFR 30-59 ml/min (HCC)   Gallstones with acute cholecystitis   Thrombocytopenia (HCC)   Iron deficiency anemia   Mixed hyperlipidemia   Coronary atherosclerosis of native coronary artery   Essential hypertension   Gastroesophageal reflux disease without esophagitis   Type 2 diabetes with nephropathy (HCC)   Non-traumatic rhabdomyolysis   Cirrhosis of liver with ascites (HCC)   Shortness of breath   Other ascites   Abnormal CT scan, esophagus   Chronic cholecystitis   Brief Hospital admission course: Edward [name]  is a 71 y.o. male with past medical history relevant for moderate thrombocytopenia, macrocytic anemia with superimposed iron deficiency, copper deficiency, CKD 3B, HLD, CAD, DM2 and HTN who presents to the ED with worsening fatigue, weakness, myalgias and malaise -Symptoms have worsened over the last  month -Patient has been more sedentary over the last couple weeks - Additional history obtained from patient's ex-wife and son Edward [name] at bedside -Has nausea but no vomiting -No fever  Or chills  No chest pains, no palpitations no dizziness -Patient denies  alcohol use -In the ED UA without significant abnormalities -Magnesium and lactic acid WNL -INR 1.4 -Total CPK 6902 -Sodium 134, Hgb 10.4, Platelets 52 --Creatinine 2.02 (baseline 1.5 to 1.6) -AST 288 >> 274  ALT 93 >> 120 T. Bili 2.8 >> 3.1 A1c- 6.7 on 11/14/2021.[name] Random Urine Copper high at 72 (09/12/21)   -Right upper quadrant ultrasound with gallbladder wall thickening and gallstones suggesting acute cholecystitis as well as finding of liver cirrhosis with ascites -CT of the C-spine, T and L-spine without metastatic findings or other acute findings -CT head without acute findings -CT chest abdomen and pelvis with finding of liver cirrhosis with portal hypertension with small volume ascites along with splenomegaly -Small pulmonary nodules measuring 5 mm or less-likely benign -Patulous and thickened esophagus -Gallstones  Assessment and Plan: 1) chronic cholecystitis with Elevated LFTs and Liver Cirrhosis---currently no significant abd pain - CT abdomen and pelvis and right upper quadrant ultrasound consistent with gallstones cholelithiasis. -Per discussion with general surgery and following HIDA scan no signs of acute cholecystitis seen (rule out). -Appreciate GI and general surgery consultation/assistance.  Patient was observed for 48 hours of antibiotics prior to discharge.  No signs of acute cholecystitis. -As needed pain medication and antiemetics HIDA scan with cystic duct patency noted-so no need for IR cholecystectomy tube. -May need liver biopsy as outpatient. -EGD as outpatient to rule out varices and active signs of bleeding -Ultrasound from 11/21/2021 reveals not enough fluid for paracentesis   2)Liver Cirrhosis with Ascites and Portal Hypertension---??? Etiology -No significant alcohol use -INR 1.4, bilirubin is 3.1 -Random Urine Copper high at 72 (09/12/21) -PTA Patient was on atorvastatin and copper supplements -??? Wilson's disease in the setting of low copper  and  low ceruloplasmin.  Low ceruloplasmin may also could be from copper deficiency--outpatient ophthalmology for slit lamp exam to rule out [employer] rings. -Viral hepatitis profile was negative (negative for hep C and hep B may have had hep A infection in the past with antibodies noted) -Ammonia was 49-- no encephalopathy symptoms. Will discharge on lactulose to 5 g daily to avoid excessive stools -ANA, mitochondrial antibodies, anti-smooth antibodies, IgG IgA and IgM requested by GI service and will be follow by them. -Would benefit from EGD and if needed liver biopsy as outpatient.  3)History of copper deficiency--- has been on copper replacement for over 1 year -Random Urine Copper high at 72 (09/12/21) -continue to Hold copper tablets as copper toxicity can lead to hepatic dysfunction. -Discussed with patient's hematologist Dr. Delton [name]--- liver biopsy will be able to tell if patient has copper toxicity or not   4)Elevated LFTs/elevated CPK -CT abdomen and pelvis and right upper quadrant ultrasound consistent with gallstone cholelithiasis. -Elevated CPK noted----Total CPK 6902>>5,817>>4,090 -Elevated LFTs including bilirubin noted -Elevated urine copper levels noted -Stopped copper supplements and Stop atorvastatin -close LFT's monitoring recommended   5)Anemia of iron deficiency and chronic disease/Thrombocytopenia--- with macrocytosis --diagnosed in May 2021 -- SPEP in June 2021 was normal.  Erythropoietin normal at 11.9. -Previously declined bone marrow biopsy for evaluation of possible early MDS --Hgb 10.4>>11.5 - Platelets 52>>58 -Previously treated with copper supplements -Prior work-up revealed somewhat low iron --- B12 and Folate WNL -patient with anemia of other iron deficiency and chronic disease    6)AKI----acute kidney injury on CKD stage -3a ---Creatinine on admission was 2.02 -baseline---  1.5 to 1.6 -Creatinine is down to 1.50 -Suspect due to his baseline prior  to discharge. -Continue to follow with nephrology as an outpatient -Continue to avoid nephrotoxic agents. -Maintain adequate hydration. -Patient usually follows with Edward [name]   7)Hyponatremia------ suspect dehydration related,  -sodium 134>>136 --avoid excessive free water   8)DM2---  A1c- 6.7 on 11/14/2021..   -Reflecting good diabetic control PTA -Continue Accu-Cheks and resume home hypoglycemic regimen.   9)-Esophagitis--- CT chest shows -Patulous and thickened esophagus. -Continue PPI daily. -Per GI service recommendation EGD as outpatient to be done as noted above #1   10)Social/ethics--- Discussed with patient's ex-wife and son Edward [name] at bedside -Patient is a full code.   11)HypoGlycemia----due to poor oral intake in setting of underlying liver cirrhosis  -Patient advised not to skip meals -Continue oral hypoglycemic agents.   Consultants: Gastroenterology service and general surgery. Procedures performed: See below for x-ray reports. Disposition: Home Diet recommendation: Heart healthy/low-sodium diet (less than 2 g).  DISCHARGE MEDICATION: Allergies as of 11/22/2021   No Known Allergies      Medication List     STOP taking these medications    atorvastatin 40 MG tablet Commonly known as: LIPITOR   Copper 5 MG Tabs   Lokelma 5 g packet Generic drug: sodium zirconium cyclosilicate   Veltassa 8.4 g packet Generic drug: patiromer       TAKE these medications    Accu-Chek Aviva Plus test strip Generic drug: glucose blood USE TO CHECK BLOOD SUGAR ONCE DAILY   acetaminophen 500 MG tablet Commonly known as: TYLENOL Take 500 mg by mouth every 6 (six) hours as needed.   aspirin 81 MG tablet Take 81 mg by mouth daily.   B-12 PO Take by mouth.   brimonidine 0.2 % ophthalmic solution Commonly known as: ALPHAGAN Place 1 drop into both eyes 2 (two) times  daily.   feeding supplement Liqd Take 237 mLs by mouth 2 (two) times daily between meals.    ferrous sulfate 325 (65 FE) MG tablet Take 1 tablet (325 mg total) by mouth daily with breakfast. Start taking on: November 23, 2021   Fish Oil 1000 MG Caps Take by mouth.   gemfibrozil 600 MG tablet Commonly known as: LOPID TAKE 1 TABLET BY MOUTH TWICE DAILY BEFORE MEAL(S)   glipiZIDE 5 MG tablet Commonly known as: GLUCOTROL 2 tablets in morning and 1 tablet at night What changed:  how much to take how to take this when to take this additional instructions   isosorbide mononitrate 30 MG 24 hr tablet Commonly known as: IMDUR Take 1 tablet (30 mg total) by mouth daily. New 01/30/2018   lactulose 10 GM/15ML solution Commonly known as: CHRONULAC Take 7.5 mLs (5 g total) by mouth daily. Start taking on: November 23, 2021   metoprolol tartrate 25 MG tablet Commonly known as: LOPRESSOR Take 0.5 tablets (12.5 mg total) by mouth in the morning and at bedtime.   nitroGLYCERIN 0.4 MG SL tablet Commonly known as: NITROSTAT Place 1 tablet (0.4 mg total) under the tongue every 5 (five) minutes x 3 doses as needed for chest pain (if no relief after 2nd dose, proceed to the ED for an evaluation).   pantoprazole 40 MG tablet Commonly known as: PROTONIX Take 1 tablet (40 mg total) by mouth daily.   polyethylene glycol 17 g packet Commonly known as: MIRALAX / GLYCOLAX Take 17 g by mouth daily as needed for mild constipation.   sodium bicarbonate 650 MG tablet Take 1 tablet (650 mg total) by mouth 3 (three) times daily. What changed:  how much to take when to take this   traZODone 100 MG tablet Commonly known as: DESYREL Take 1 tablet (100 mg total) by mouth at bedtime.   Vitamin D3 50 MCG (2000 UT) Tabs Take 1 tablet by mouth daily.        Follow-up Information     Edward Pretty, FNP. Schedule an appointment as soon as possible for a visit in 10 day(s).   Specialty: Family Medicine Contact information: [place] [address] [phone]          Satira Sark, MD .   Specialty: Cardiology Contact information: [place] [address] [phone]         Montez Morita, Quillian Quince, MD. Schedule an appointment as soon as possible for a visit in 2 week(s).   Specialty: Gastroenterology Why: contact office for appointment details. Contact information: [address] [place] [address] [phone]                Discharge Exam: Danley Danker Weights   11/20/21 0552 11/21/21 0420 11/22/21 0500  Weight: 76.5 kg 79.7 kg 78.6 kg   General exam: Alert, awake, oriented x 3; no icterus on exam.  No fever, reports no nausea, no vomiting, no significant abdominal discomfort and is tolerating diet.  Feeling ready to go home. Respiratory system: Good air movement bilateral, no using accessory muscles; good oxygen saturation. Cardiovascular system:RRR.  No rubs, no gallops, no JVD. Gastrointestinal system: Abdomen is slightly distended, positive bowel sounds, saw nurse reported without guarding or significant tenderness.  Positive bowel sounds. Central nervous system: Alert and oriented. No focal neurological deficits. Extremities: No cyanosis, clubbing or edema. Skin: No petechiae. Psychiatry: Judgement and insight appear normal. Mood & affect appropriate.  Condition at discharge: Stable and improved.  The results of significant diagnostics from this hospitalization (including imaging, microbiology, ancillary and laboratory) are listed below for reference.   Imaging Studies: DG Chest 2 View  Result Date: 11/21/2021 CLINICAL DATA:  Shortness of breath and weakness. EXAM: CHEST - 2 VIEW COMPARISON:  Chest two views 09/14/2013, CT chest 11/18/2021 FINDINGS: Cardiac and mediastinal contours are within limits. Interval decrease in bilateral lung volumes compared to 09/14/2013 radiographs, however this is likely similar to 11/18/2021 CT. There are bibasilar opacities including horizontal linear  septal thickening that was seen on prior CT. Minimal blunting of the bilateral costophrenic angles raises the question of tiny bilateral pleural effusions, however none were seen on recent CT and this may be secondary to the peripheral inferior pulmonary fibrosis. No pneumothorax. Moderate multilevel degenerative disc changes of the midthoracic spine. IMPRESSION: Decreased lung volumes with bibasilar interlobular septal thickening corresponding to the mild interstitial pulmonary fibrosis seen on recent CT. Minimal blunting of the bilateral costophrenic angles raises the question of tiny bilateral pleural effusions, however as no pleural effusions were seen on recent CT, this may reflect the chronic interstitial scarring. Electronically Signed   By: Yvonne Kendall M.D.   On: 11/21/2021 12:58   [place] ASCITES (ABDOMEN LIMITED)  Result Date: 11/21/2021 CLINICAL DATA:  Elevated liver function tests.  Ascites. EXAM: LIMITED ABDOMEN ULTRASOUND FOR ASCITES TECHNIQUE: Limited ultrasound survey for ascites was performed in all four abdominal quadrants. COMPARISON:  11/18/2021 FINDINGS: Small amount of ascites is present in this patient with morphologic findings of cirrhosis. The amount of ascites is currently felt to be below the volume threshold at which paracentesis would yield a significant therapeutic benefit. IMPRESSION: 1. Small amount of scattered ascites in the abdomen.  Cirrhosis. Electronically Signed   By: Van Clines M.D.   On: 11/21/2021 12:05   NM Hepato W/EF  Result Date: 11/20/2021 CLINICAL DATA:  Right upper quadrant abdominal pain x1 month, nondiagnostic ultrasound. EXAM: NUCLEAR MEDICINE HEPATOBILIARY IMAGING WITH GALLBLADDER EF TECHNIQUE: Sequential images of the abdomen were obtained out to 60 minutes following intravenous administration of radiopharmaceutical. After slow intravenous infusion of 1.5 micrograms Cholecystokinin, gallbladder ejection fraction was determined. RADIOPHARMACEUTICALS:   5.5 mCi Tc-32mCholetec IV COMPARISON:  Ultrasound November 18, 2021 FINDINGS: Delayed blood pool activity with delayed clearance of radiotracer from the liver. Prompt uptake and biliary excretion of activity by the liver is seen. Gallbladder activity is visualized, consistent with patency of cystic duct. Biliary activity passes into small bowel, consistent with patent common bile duct. There is an initial drop in radiotracer activity post CCK administration however there is a subsequent increase in activity towards the end of scintigraphic sequences. In the setting of delayed clearance of radiotracer from the liver. Calculated gallbladder ejection fraction of -56% (at 60 min, normal ejection fraction is greater than 40%.) IMPRESSION: 1. Delayed clearance of radiotracer from blood pool and the liver suggestive of hepatitis, which limits the utility of the study. 2. Reduced gallbladder ejection fraction, which is at least somewhat related to the delayed radiotracer clearance from the liver but is at least suggestive of chronic cholecystitis/biliary dyskinesia. Electronically Signed   By: JDahlia BailiffM.D.   On: 11/20/2021 13:06   [place]dom[place] Limited RUQ (LIVER/GB)  Result Date: 11/18/2021 CLINICAL DATA:  Abnormal LFTs. EXAM: ULTRASOUND ABDOMEN LIMITED RIGHT UPPER QUADRANT COMPARISON:  CT of the abdomen and pelvis 11/18/2021 FINDINGS: Gallbladder: Gallbladder wall is thickened measuring 5.3 mm. No sonographic [place]ell Mill[place] is described. Shadowing  gallstones measure up to 11 mm. Common bile duct: Diameter: 3.0 mm, within normal limits. Liver: Liver is shrunken within nodular appearance. Heterogeneous increased echogenicity noted. No discrete lesions are present. Portal vein is patent on color Doppler imaging with normal direction of blood flow towards the liver. Other: Moderate abdominal ascites is present. IMPRESSION: 1. Gallbladder wall thickening with cholelithiasis suggesting acute cholecystitis. 2. Findings  suggestive of hepatic cirrhosis. 3. Ascites. Electronically Signed   By: San Morelle M.D.   On: 11/18/2021 12:01   CT CHEST ABDOMEN PELVIS WO CONTRAST  Result Date: 11/18/2021 CLINICAL DATA:  71 year old male history of weakness. Metastatic disease. EXAM: CT CHEST, ABDOMEN AND PELVIS WITHOUT CONTRAST TECHNIQUE: Multidetector CT imaging of the chest, abdomen and pelvis was performed following the standard protocol without IV contrast. RADIATION DOSE REDUCTION: This exam was performed according to the departmental dose-optimization program which includes automated exposure control, adjustment of the mA and/or kV according to patient size and/or use of iterative reconstruction technique. COMPARISON:  No priors. FINDINGS: CT CHEST FINDINGS Cardiovascular: Heart size is normal. There is no significant pericardial fluid, thickening or pericardial calcification. There is aortic atherosclerosis, as well as atherosclerosis of the great vessels of the mediastinum and the coronary arteries, including calcified atherosclerotic plaque in the left main, left anterior descending, left circumflex and right coronary arteries. No pathologically enlarged lymph nodes. Mediastinum/Nodes: No pathologically enlarged mediastinal or hilar lymph nodes. Please note that accurate exclusion of hilar adenopathy is limited on noncontrast CT scans. Esophagus is mildly patulous and thickened without a discrete esophageal mass confidently identified. No axillary lymphadenopathy. Lungs/Pleura: 3 mm right upper lobe pulmonary nodule (axial image 79 of series 5). 5 mm right lower lobe pulmonary nodule (axial image 91 of series 5). No larger more suspicious appearing pulmonary nodules or masses are noted. No acute consolidative airspace disease. No pleural effusions. Patchy areas of peripheral predominant ground-glass attenuation, septal thickening and mild subpleural reticulation are noted throughout the mid to lower lungs bilaterally. No  honeycombing. Musculoskeletal: There are no aggressive appearing lytic or blastic lesions noted in the visualized portions of the skeleton. CT ABDOMEN PELVIS FINDINGS Hepatobiliary: Liver has a shrunken appearance and nodular contour, indicative of underlying cirrhosis. No definite discrete cystic or solid hepatic lesions are confidently identified on today's noncontrast CT examination. Calcified gallstones lying dependently in the gallbladder. Gallbladder is moderately distended. Accurate assessment for pericholecystic fluid and inflammation is limited by presence of perihepatic ascites. Pancreas: No definite pancreatic mass. Haziness in the peripancreatic fat. No well-defined peripancreatic fluid collections. Spleen: Spleen is enlarged measuring 14.8 x 9.0 x 14.4 cm (estimated splenic volume of 959 mL) . Adrenals/Urinary Tract: Unenhanced appearance of the kidneys and bilateral adrenal glands is normal. No hydroureteronephrosis. Urinary bladder is unremarkable in appearance. Stomach/Bowel: Unenhanced appearance of the stomach is unremarkable. No pathologic dilatation of small bowel or colon. Numerous colonic diverticulae are noted, particularly in the sigmoid colon. No definite focal surrounding inflammatory changes to clearly indicate acute diverticulitis are noted at this time (assessment is limited by presence of small volume of ascites). Appendix is not confidently identified. Vascular/Lymphatic: Atherosclerotic calcifications throughout the abdominal aorta and pelvic vasculature. Portal vein is dilated measuring 16 mm in the porta hepatis. No definite lymphadenopathy noted in the abdomen or pelvis on today's noncontrast CT examination. Reproductive: Prostate gland and seminal vesicles are unremarkable in appearance. Other: Small volume of ascites.  No pneumoperitoneum. Musculoskeletal: There are no aggressive appearing lytic or blastic lesions noted in the visualized portions of  the skeleton. IMPRESSION: 1.  Cirrhosis with small volume of ascites. Dilated portal vein and splenomegaly indicative of portal hypertension. 2. Small pulmonary nodules measuring 5 mm or less in size in the right lung, nonspecific, but statistically likely benign. No follow-up needed if patient is low-risk (and has no known or suspected primary neoplasm). Non-contrast chest CT can be considered in 12 months if patient is high-risk. This recommendation follows the consensus statement: Guidelines for Management of Incidental Pulmonary Nodules Detected on CT Images: From the Fleischner Society 2017; Radiology 2017; 284:228-243. 3. No other definite signs of potential metastatic disease noted elsewhere in the chest, abdomen or pelvis on today's noncontrast CT examination. 4. Esophagus appears mildly patulous and diffusely thickened without discrete esophageal mass. Further evaluation with nonemergent endoscopy should be considered to better evaluate these findings in the near future. 5. Cholelithiasis without evidence of acute cholecystitis at this time. 6. Aortic atherosclerosis, in addition to left main and three-vessel coronary artery disease. Please note that although the presence of coronary artery calcium documents the presence of coronary artery disease, the severity of this disease and any potential stenosis cannot be assessed on this non-gated CT examination. Assessment for potential risk factor modification, dietary therapy or pharmacologic therapy may be warranted, if clinically indicated. 7. Additional incidental findings, as above. Electronically Signed   By: Vinnie Langton M.D.   On: 11/18/2021 09:35   CT L-SPINE NO CHARGE  Result Date: 11/18/2021 CLINICAL DATA:  71 year old male being evaluated for metastatic disease. Weakness. Unable to walk. EXAM: CT LUMBAR SPINE WITHOUT CONTRAST TECHNIQUE: Technique: Multiplanar CT images of the lumbar spine were reconstructed from contemporary CT of the Abdomen and Pelvis. RADIATION DOSE  REDUCTION: This exam was performed according to the departmental dose-optimization program which includes automated exposure control, adjustment of the mA and/or kV according to patient size and/or use of iterative reconstruction technique. CONTRAST:  None COMPARISON:  Thoracic spine CT, CT Chest, Abdomen, and Pelvis today reported separately. FINDINGS: Segmentation: Normal, concordant with the thoracic spine numbering today. Alignment: Normal lumbar lordosis. No spondylolisthesis. Minimal levoconvex lumbar scoliosis. Vertebrae: Bone mineralization is within normal limits for age. No lumbar vertebral fracture or lesion identified. Visible sacrum and SI joints appear intact. Paraspinal and other soft tissues: Abdominal and pelvic viscera are detailed separately. Lumbar paraspinal soft tissues are within normal limits. Disc levels: Age-appropriate lumbar spine degeneration. Disc bulging and endplate spurring with mild if any associated lumbar spinal stenosis. IMPRESSION: 1. Age-appropriate noncontrast CT appearance of the Lumbar Spine. 2.  CT Chest, Abdomen, and Pelvis today are reported separately. Electronically Signed   By: Genevie Ann M.D.   On: 11/18/2021 09:34   CT T-SPINE NO CHARGE  Result Date: 11/18/2021 CLINICAL DATA:  71 year old male being evaluated for metastatic disease. Weakness. Unable to walk. EXAM: CT THORACIC SPINE WITHOUT CONTRAST TECHNIQUE: Multiplanar CT images of the thoracic spine were reconstructed from contemporary CT of the Chest. RADIATION DOSE REDUCTION: This exam was performed according to the departmental dose-optimization program which includes automated exposure control, adjustment of the mA and/or kV according to patient size and/or use of iterative reconstruction technique. CONTRAST:  None COMPARISON:  CT cervical spine, CT Chest, Abdomen, and Pelvis today reported separately. FINDINGS: Limited cervical spine imaging:  Reported separately today. Thoracic spine segmentation:  Normal.  Alignment: Normal for age thoracic kyphosis. No spondylolisthesis. No scoliosis. Vertebrae: Bone mineralization is within normal limits for age. No thoracic vertebral lesion by CT. No thoracic vertebral compression or fracture identified. Visible posterior  ribs appear intact. Paraspinal and other soft tissues: Chest and abdominal viscera are reported separately. Thoracic paraspinal soft tissues are within normal limits. Disc levels: Age-appropriate thoracic spine degeneration. No CT evidence of thoracic spinal stenosis. Unremarkable noncontrast appearance of the thoracic spinal canal. IMPRESSION: 1. Normal for age noncontrast CT appearance of the Thoracic Spine. 2.  CT Chest, Abdomen, and Pelvis today are reported separately. Electronically Signed   By: Genevie Ann M.D.   On: 11/18/2021 09:30   CT HEAD WO CONTRAST (5MM)  Result Date: 11/18/2021 CLINICAL DATA:  71 year old male being evaluated for metastatic disease. Weakness. Unable to walk. EXAM: CT HEAD WITHOUT CONTRAST TECHNIQUE: Contiguous axial images were obtained from the base of the skull through the vertex without intravenous contrast. RADIATION DOSE REDUCTION: This exam was performed according to the departmental dose-optimization program which includes automated exposure control, adjustment of the mA and/or kV according to patient size and/or use of iterative reconstruction technique. COMPARISON:  None Available. FINDINGS: Brain: Fairly normal for age cerebral volume. No midline shift, ventriculomegaly, mass effect, evidence of mass lesion, intracranial hemorrhage or evidence of cortically based acute infarction. Gray-white matter differentiation is within normal limits throughout the brain. No encephalomalacia or cerebral edema identified. Vascular: Calcified atherosclerosis at the skull base. Skull: No acute or suspicious bone lesion identified. Sinuses/Orbits: Visualized paranasal sinuses and mastoids are well aerated. Trace bilateral mastoid air cell  fluid. Small mucous retention cyst right maxillary sinus. Other: Negative visible nasopharynx. No suspicious orbit or scalp soft tissue finding. IMPRESSION: Negative for age noncontrast Head CT. Note that early metastatic disease to the brain is not excluded in the absence of intravenous contrast. Electronically Signed   By: Genevie Ann M.D.   On: 11/18/2021 09:26   CT CERVICAL SPINE WO CONTRAST  Result Date: 11/18/2021 CLINICAL DATA:  71 year old male being evaluated for metastatic disease. Weakness. Unable to walk. EXAM: CT CERVICAL SPINE WITHOUT CONTRAST TECHNIQUE: Multidetector CT imaging of the cervical spine was performed without intravenous contrast. Multiplanar CT image reconstructions were also generated. RADIATION DOSE REDUCTION: This exam was performed according to the departmental dose-optimization program which includes automated exposure control, adjustment of the mA and/or kV according to patient size and/or use of iterative reconstruction technique. COMPARISON:  Head CT reported separately. FINDINGS: Alignment: Mild straightening of cervical lordosis. Cervicothoracic junction alignment is within normal limits. Bilateral posterior element alignment is within normal limits. Skull base and vertebrae: Bone mineralization is within normal limits for age. Visualized skull base is intact. No atlanto-occipital dissociation. No acute or suspicious osseous lesion identified in the cervical spine. Soft tissues and spinal canal: No prevertebral fluid or swelling. No visible canal hematoma. Negative noncontrast visible neck soft tissues except for calcified carotid atherosclerosis. Disc levels: Ordinary lower cervical disc and endplate degeneration, bulky at C6-C7, eccentric to the left, and associated with up to mild spinal stenosis at that level. But no other convincing cervical spinal stenosis by CT. There is associated moderate to severe left C7 proximal foraminal stenosis. Upper chest: Chest CT, thoracic  spine CT reported separately. IMPRESSION: 1. No acute or metastatic process identified in the Cervical Spine by noncontrast CT. 2. Ordinary cervical spine degeneration at C6-C7 with suspected mild spinal stenosis and moderate to severe left C7 foraminal stenosis. Electronically Signed   By: Genevie Ann M.D.   On: 11/18/2021 09:24    Microbiology: Results for orders placed or performed in visit on 05/18/16  Veritor Flu A/B Waived     Status: None  Collection Time: 05/18/16  4:45 PM  Result Value Ref Range Status   Influenza A Negative Negative Final   Influenza B Negative Negative Final    Comment: If the test is negative for the presence of influenza A or influenza B antigen, infection due to influenza cannot be ruled-out because the antigen present in the sample may be below the detection limit of the test. It is recommended that these results be confirmed by viral culture or an FDA-cleared influenza A and B molecular assay.     Labs: CBC: Recent Labs  Lab 11/18/21 0644 11/19/21 0451 11/20/21 0441 11/21/21 0415 11/22/21 0610  WBC 5.1 6.3 6.0 8.6 7.8  NEUTROABS  --   --   --  5.8  --   HGB 10.4* 11.5* 10.6* 11.8* 11.7*  HCT 30.3* 33.9* 31.8* 36.2* 35.9*  MCV 97.1 96.9 98.8 100.6* 99.7  PLT 52* 58* 57* 78* 80*   Basic Metabolic Panel: Recent Labs  Lab 11/18/21 0644 11/19/21 0451 11/20/21 0441 11/21/21 0415 11/22/21 0610  NA 134* 138 140 136 138  K 3.7 3.5 3.9 3.6 4.2  CL 99 106 109 106 109  CO2 _0 GLUCOSE 79 44* 93 179* 126*  BUN 34* 30* 31* 32* 30*  CREATININE 2.02* 1.60* 1.53* 1.50* 1.27*  CALCIUM 8.2* 8.3* 8.3* 8.2* 8.5*  MG 2.1  --   --   --   --    Liver Function Tests: Recent Labs  Lab 11/18/21 0644 11/19/21 0451 11/20/21 0441 11/20/21 1633 11/21/21 0415 11/22/21 0610  AST 274* 290* 260*  --  244* 176*  ALT 120* 127* 127*  --  148* 137*  ALKPHOS 50 49 44  --  53 52  BILITOT 3.1* 2.8* 2.1* 2.9* 2.1* 2.4*  PROT 5.3* 5.3* 5.1*  --  5.9* 5.6*   ALBUMIN 2.4* 2.4* 2.2*  --  2.5* 2.4*   CBG: Recent Labs  Lab 11/21/21 1150 11/21/21 1623 11/21/21 2302 11/22/21 0542 11/22/21 1319  GLUCAP 196* 164* 140* 128* 153*    Discharge time spent: greater than 30 minutes.  Signed: Barton Dubois, MD [facility] 11/22/2021

## 2021-11-22 NOTE — Care Management Important Message (Signed)
Important Message  Patient Details  Name: Edward [name] MRN: [id number] Date of Birth: 12-Oct-1950   Medicare Important Message Given:  N/A - LOS <3 / Initial given by admissions     Tommy Medal 11/22/2021, 11:18 AM

## 2021-11-22 NOTE — Progress Notes (Signed)
Subjective: Patient up sitting in the chair upon assessment. States he has a mild headache this morning. Denies nausea, vomiting or abdominal pain other than soreness from lovenox injections. Feels that abdomen is still bloated. Had 2 looser BMs this morning but did not evaluate for presence of blood or melena. He ate a small amount of breakfast this morning and tolerated this well.   Objective: Vital signs in last 24 hours: Temp:  [97.6 F (36.4 C)-98.6 F (37 C)] 98.4 F (36.9 C) (07/19 0412) Pulse Rate:  [64-85] 75 (07/19 0412) Resp:  [19-20] 20 (07/19 0412) BP: (122-133)/(61-70) 133/66 (07/19 0838) SpO2:  [97 %-100 %] 98 % (07/19 0412) Weight:  [78.6 kg] 78.6 kg (07/19 0500) Last BM Date : 11/21/21 General:   Alert and oriented, pleasant Head:  Normocephalic and atraumatic. Eyes:  No icterus, sclera clear. Conjuctiva pink.  Mouth:  Without lesions, mucosa pink and moist.  Heart:  S1, S2 present, no murmurs noted.  Lungs: Clear to auscultation bilaterally, without wheezing, rales, or rhonchi.  Abdomen:  Bowel sounds present. Abdomen is distended and mildly tense, but not taut. Bruising present to bilateral lower abdomen from lovenox injections with soreness present there. Msk:  Symmetrical without gross deformities. Normal posture. Pulses:  Normal pulses noted. Extremities:  1+ pitting edema in LEs, compression socks in place Neurologic:  Alert and  oriented x4;  grossly normal neurologically. No asterixis. Skin:  Warm and dry, intact without significant lesions.  Psych:  Alert and cooperative. Normal mood and affect.  Intake/Output from previous day: 07/18 0701 - 07/19 0700 In: 1337.8 [P.O.:1080; I.V.:257.8] Out: -  Intake/Output this shift: No intake/output data recorded.  Lab Results: Recent Labs    11/20/21 0441 11/21/21 0415 11/22/21 0610  WBC 6.0 8.6 7.8  HGB 10.6* 11.8* 11.7*  HCT 31.8* 36.2* 35.9*  PLT 57* 78* 80*   BMET Recent Labs    11/20/21 0441  11/21/21 0415 11/22/21 0610  NA 140 136 138  K 3.9 3.6 4.2  CL 109 106 109  CO2 _0 GLUCOSE 93 179* 126*  BUN 31* 32* 30*  CREATININE 1.53* 1.50* 1.27*  CALCIUM 8.3* 8.2* 8.5*   LFT Recent Labs    11/20/21 0441 11/20/21 1633 11/21/21 0415 11/22/21 0610  PROT 5.1*  --  5.9* 5.6*  ALBUMIN 2.2*  --  2.5* 2.4*  AST 260*  --  244* 176*  ALT 127*  --  148* 137*  ALKPHOS 44  --  53 52  BILITOT 2.1* 2.9* 2.1* 2.4*  BILIDIR  --  1.0*  --   --   IBILI  --  1.9*  --   --    PT/INR Recent Labs    11/21/21 0415 11/22/21 0610  LABPROT 17.7* 18.1*  INR 1.5* 1.5*   Hepatitis Panel Recent Labs    11/20/21 1633  HEPBSAG NON REACTIVE  HCVAB NON REACTIVE  HEPAIGM NON REACTIVE    Studies/Results: DG Chest 2 View  Result Date: 11/21/2021 CLINICAL DATA:  Shortness of breath and weakness. EXAM: CHEST - 2 VIEW COMPARISON:  Chest two views 09/14/2013, CT chest 11/18/2021 FINDINGS: Cardiac and mediastinal contours are within limits. Interval decrease in bilateral lung volumes compared to 09/14/2013 radiographs, however this is likely similar to 11/18/2021 CT. There are bibasilar opacities including horizontal linear septal thickening that was seen on prior CT. Minimal blunting of the bilateral costophrenic angles raises the question of tiny bilateral pleural effusions, however none were seen on recent CT  and this may be secondary to the peripheral inferior pulmonary fibrosis. No pneumothorax. Moderate multilevel degenerative disc changes of the midthoracic spine. IMPRESSION: Decreased lung volumes with bibasilar interlobular septal thickening corresponding to the mild interstitial pulmonary fibrosis seen on recent CT. Minimal blunting of the bilateral costophrenic angles raises the question of tiny bilateral pleural effusions, however as no pleural effusions were seen on recent CT, this may reflect the chronic interstitial scarring. Electronically Signed   By: Yvonne Kendall M.D.   On:  11/21/2021 12:58   [place] ASCITES (ABDOMEN LIMITED)  Result Date: 11/21/2021 CLINICAL DATA:  Elevated liver function tests.  Ascites. EXAM: LIMITED ABDOMEN ULTRASOUND FOR ASCITES TECHNIQUE: Limited ultrasound survey for ascites was performed in all four abdominal quadrants. COMPARISON:  11/18/2021 FINDINGS: Small amount of ascites is present in this patient with morphologic findings of cirrhosis. The amount of ascites is currently felt to be below the volume threshold at which paracentesis would yield a significant therapeutic benefit. IMPRESSION: 1. Small amount of scattered ascites in the abdomen.  Cirrhosis. Electronically Signed   By: Van Clines M.D.   On: 11/21/2021 12:05   NM Hepato W/EF  Result Date: 11/20/2021 CLINICAL DATA:  Right upper quadrant abdominal pain x1 month, nondiagnostic ultrasound. EXAM: NUCLEAR MEDICINE HEPATOBILIARY IMAGING WITH GALLBLADDER EF TECHNIQUE: Sequential images of the abdomen were obtained out to 60 minutes following intravenous administration of radiopharmaceutical. After slow intravenous infusion of 1.5 micrograms Cholecystokinin, gallbladder ejection fraction was determined. RADIOPHARMACEUTICALS:  5.5 mCi Tc-9mCholetec IV COMPARISON:  Ultrasound November 18, 2021 FINDINGS: Delayed blood pool activity with delayed clearance of radiotracer from the liver. Prompt uptake and biliary excretion of activity by the liver is seen. Gallbladder activity is visualized, consistent with patency of cystic duct. Biliary activity passes into small bowel, consistent with patent common bile duct. There is an initial drop in radiotracer activity post CCK administration however there is a subsequent increase in activity towards the end of scintigraphic sequences. In the setting of delayed clearance of radiotracer from the liver. Calculated gallbladder ejection fraction of -56% (at 60 min, normal ejection fraction is greater than 40%.) IMPRESSION: 1. Delayed clearance of radiotracer from  blood pool and the liver suggestive of hepatitis, which limits the utility of the study. 2. Reduced gallbladder ejection fraction, which is at least somewhat related to the delayed radiotracer clearance from the liver but is at least suggestive of chronic cholecystitis/biliary dyskinesia. Electronically Signed   By: JDahlia BailiffM.D.   On: 11/20/2021 13:06    Assessment: 71y.o. year old male with past medical history significant for moderate thrombocytopenia, macrocytic anemia, copper deficiency following with hematology, CKD, HLD, CAD, DM 2, HTN, found to have elevated CK, possible calculus cholecystitis, elevated LFTs and cirrhosis.  Elevated LFTs:Etiology unclear, possibly secondary to Acute rhabdomyolysis. History of mildly elevated LFTs in the latter part of 2022, but normalized in January and had been normal until 7/11 when he was found to have AST 288, ALT 93, total bilirubin 2.8, alk phos normal.  Persistent elevation this admission, though they appear to be trending down some today with AST 176, ALT 137, ALk PHos WNL, T Bili 2.4.  Notably, yesterday bilirubin was fractionated and both direct and indirect bilirubin were elevated at 1.0 and 1.9 respectively. [place]/cirrhosis which is a new diagnosis. Also with cholelithiasis on imaging, HIDA with patent CBD and cystic duct, no acute cholecystitis, but demonstrated biliary dyskinesia/chronic cholecystitis. Denies any new medications, alcohol use, illicit drug use, routine Tylenol use,  OTC supplements aside from copper, family history of liver disease or autoimmune conditions.  Chronically on atorvastatin, currently on hold. Recent iron panel WNL. Viral hepatitis negative.  AIH serologies negative.  Wilsons disease has been in question outpatient due to low ceruloplasmin and elevated 24 hr urine copper (while on copper supplementation). Will need ophthalmology evaluation to rule out [facility] rings. Ultimately may need to consider liver biopsy if  work-up is unrevealing.   Cirrhosis: new this admission, [place] in aug 2022 WNL. MELD 17 Child Pugh Class B. No hx of ETOH use, question of Wilson's disease, as outlined above. Acute Hep panel negative, AIH serologies Negative.   Cirrhosis complicated by presence of ascites, portal vein appeared patent on [place]. INR elevated at 1.5 and hx of chronic thrombocytopenia. No s/s of HE, ammonia was 49 on 7/16, pt started on lactulose, decrease dose initiated yesterday. Paracentesis planned yesterday though [place] without significant fluid to indicate need for Para. Abdomen is distended and mildly tense today though not taut, patient feels little change from yesterday, likely not enough fluid present to warrant Paracentesis today either. Ultimately, will need diuretic therapy but this will need to be delayed in setting of acute Rhabdo.   Esophageal wall thickening: Mildly patulous and diffuse thickened esophagus without discrete esophageal mass noted on CT A/P without contrast.  No prior EGD.  Denies heartburn or dysphagia symptoms. No significant weight loss. Will need Outpatient EGD for further evaluation after acute illness.   Anemia:chronic, normocytic, baseline hgb 11-12 range. Follows with hematology. No evidence of IDA, B12 or folate deficiency noted in chart. Hgb 10.4 on admission, 11.7 today. No overt GI bleeding. Last TCS 10 years ago with [facility], 2 polyps, no previous upper endoscopies.   Cholelithiasis, concern for cholecystitis: GB wall thickening on [place], HIDA suggested chronic cholecystitis/biliary dyskinesia. Asymptomatic. General Surgery not recommending surgical intervention at this time, though recommended cholecystostomy tube by IR given multi morbidities. Empiric abx discontinued previously.    Plan: 2g sodium diet Trend h&h, monitor for overt GI bleeding Lactulose titration with goal of 2-3 soft BMs daily Monitor for signs of HE MELD labs, INR daily Avoid hepatotoxic meds Outpatient EGD for  esophageal wall thickening and variceal screening Outpatient cirrhosis management Evaluation by ophthalmologist for consideration of Wilson's disease May consider liver Bx if workup is unrevealing for cause of cirrhosis   LOS: 4 days    11/22/2021, 9:06 AM  Greysen Devino L. Alver Sorrow, MSN, APRN, AGNP-C Adult-Gerontology Nurse Practitioner [facility]

## 2021-11-22 NOTE — TOC Progression Note (Signed)
Transition of Care [facility]) - Progression Note    Patient Details  Name: TAYDON NASWORTHY MRN: [id number] Date of Birth: 04/10/1951  Transition of Care [facility]) CM/SW Contact  Shade Flood, LCSW Phone Number: 11/22/2021, 2:53 PM  Clinical Narrative:      Transition of Care [facility]) Screening Note   Patient Details  Name: Baine [facility] Date of Birth: June 29, 1950   Transition of Care [facility]) CM/SW Contact:    Shade Flood, LCSW Phone Number: 11/22/2021, 2:53 PM    PT recommended [facility] PT for pt at dc. Spoke with pt today to review PT recommendation and offer referral. Pt states that he does not want referral for [facility] or outpatient PT at this time. Pt aware that he can follow up with his PCP if he changes his mind.   Transition of Care Department [facility]) has reviewed patient and no TOC needs have been identified at this time. We will continue to monitor patient advancement through interdisciplinary progression rounds. If new patient transition needs arise, please place a TOC consult.    Expected Discharge Plan: Home/Self Care    Expected Discharge Plan and Services Expected Discharge Plan: Home/Self Care         Expected Discharge Date: 11/22/21                                     Social Determinants of Health (SDOH) Interventions    Readmission Risk Interventions    11/22/2021    2:53 PM  Readmission Risk Prevention Plan  Transportation Screening Complete  Home Care Screening Patient refused  Medication Review (RN CM) Complete

## 2021-11-22 NOTE — Progress Notes (Signed)
Nsg Discharge Note  Admit Date:  11/18/2021 Discharge date: 11/22/2021   Sumit Branham [facility] to be D/C'd Home per MD order.  AVS completed.  Copy for chart, and copy for patient signed, and dated. Patient/caregiver able to verbalize understanding.  Discharge Medication: Allergies as of 11/22/2021   No Known Allergies      Medication List     STOP taking these medications    atorvastatin 40 MG tablet Commonly known as: LIPITOR   Copper 5 MG Tabs   Lokelma 5 g packet Generic drug: sodium zirconium cyclosilicate   Veltassa 8.4 g packet Generic drug: patiromer       TAKE these medications    Accu-Chek Aviva Plus test strip Generic drug: glucose blood USE TO CHECK BLOOD SUGAR ONCE DAILY   acetaminophen 500 MG tablet Commonly known as: TYLENOL Take 500 mg by mouth every 6 (six) hours as needed.   aspirin 81 MG tablet Take 81 mg by mouth daily.   B-12 PO Take by mouth.   brimonidine 0.2 % ophthalmic solution Commonly known as: ALPHAGAN Place 1 drop into both eyes 2 (two) times daily.   feeding supplement Liqd Take 237 mLs by mouth 2 (two) times daily between meals.   ferrous sulfate 325 (65 FE) MG tablet Take 1 tablet (325 mg total) by mouth daily with breakfast. Start taking on: November 23, 2021   Fish Oil 1000 MG Caps Take by mouth.   gemfibrozil 600 MG tablet Commonly known as: LOPID TAKE 1 TABLET BY MOUTH TWICE DAILY BEFORE MEAL(S)   glipiZIDE 5 MG tablet Commonly known as: GLUCOTROL 2 tablets in morning and 1 tablet at night What changed:  how much to take how to take this when to take this additional instructions   isosorbide mononitrate 30 MG 24 hr tablet Commonly known as: IMDUR Take 1 tablet (30 mg total) by mouth daily. New 01/30/2018   lactulose 10 GM/15ML solution Commonly known as: CHRONULAC Take 7.5 mLs (5 g total) by mouth daily. Start taking on: November 23, 2021   metoprolol tartrate 25 MG tablet Commonly known as: LOPRESSOR Take 0.5  tablets (12.5 mg total) by mouth in the morning and at bedtime.   nitroGLYCERIN 0.4 MG SL tablet Commonly known as: NITROSTAT Place 1 tablet (0.4 mg total) under the tongue every 5 (five) minutes x 3 doses as needed for chest pain (if no relief after 2nd dose, proceed to the ED for an evaluation).   pantoprazole 40 MG tablet Commonly known as: PROTONIX Take 1 tablet (40 mg total) by mouth daily.   polyethylene glycol 17 g packet Commonly known as: MIRALAX / GLYCOLAX Take 17 g by mouth daily as needed for mild constipation.   sodium bicarbonate 650 MG tablet Take 1 tablet (650 mg total) by mouth 3 (three) times daily. What changed:  how much to take when to take this   traZODone 100 MG tablet Commonly known as: DESYREL Take 1 tablet (100 mg total) by mouth at bedtime.   Vitamin D3 50 MCG (2000 UT) Tabs Take 1 tablet by mouth daily.        Discharge Assessment: Vitals:   11/22/21 0838 11/22/21 1406  BP: 133/66 (!) 133/57  Pulse:  77  Resp:  18  Temp:  97.8 F (36.6 C)  SpO2:  98%   Skin clean, dry and intact without evidence of skin break down, no evidence of skin tears noted. IV catheter discontinued intact. Site without signs and symptoms of complications -  no redness or edema noted at insertion site, patient denies c/o pain - only slight tenderness at site.  Dressing with slight pressure applied.  D/c Instructions-Education: Discharge instructions given to patient/family with verbalized understanding. D/c education completed with patient/family including follow up instructions, medication list, d/c activities limitations if indicated, with other d/c instructions as indicated by MD - patient able to verbalize understanding, all questions fully answered. Patient instructed to return to ED, call 911, or call MD for any changes in condition.  Patient escorted via [place], and D/C home via private auto.  Dorcas Mcmurray, LPN 6/72/0919 8:02 PM

## 2021-11-23 ENCOUNTER — Other Ambulatory Visit: Payer: Self-pay

## 2021-11-23 ENCOUNTER — Emergency Department (HOSPITAL_COMMUNITY)
Admission: EM | Admit: 2021-11-23 | Discharge: 2021-11-24 | Disposition: A | Discharge status: Home / Self Care | Payer: 59 | Attending: Emergency Medicine | Admitting: Emergency Medicine

## 2021-11-23 ENCOUNTER — Other Ambulatory Visit: Payer: Self-pay | Admitting: *Deleted

## 2021-11-23 ENCOUNTER — Encounter (HOSPITAL_COMMUNITY): Payer: Self-pay

## 2021-11-23 ENCOUNTER — Telehealth: Payer: Self-pay

## 2021-11-23 ENCOUNTER — Emergency Department (HOSPITAL_COMMUNITY): Payer: 59

## 2021-11-23 ENCOUNTER — Telehealth: Payer: Self-pay | Admitting: Cardiology

## 2021-11-23 ENCOUNTER — Encounter: Payer: Self-pay | Admitting: Internal Medicine

## 2021-11-23 DIAGNOSIS — I129 Hypertensive chronic kidney disease with stage 1 through stage 4 chronic kidney disease, or unspecified chronic kidney disease: Secondary | ICD-10-CM | POA: Insufficient documentation

## 2021-11-23 DIAGNOSIS — E1122 Type 2 diabetes mellitus with diabetic chronic kidney disease: Secondary | ICD-10-CM | POA: Insufficient documentation

## 2021-11-23 DIAGNOSIS — I2510 Atherosclerotic heart disease of native coronary artery without angina pectoris: Secondary | ICD-10-CM | POA: Diagnosis not present

## 2021-11-23 DIAGNOSIS — R140 Abdominal distension (gaseous): Secondary | ICD-10-CM | POA: Diagnosis present

## 2021-11-23 DIAGNOSIS — N183 Chronic kidney disease, stage 3 unspecified: Secondary | ICD-10-CM | POA: Diagnosis not present

## 2021-11-23 DIAGNOSIS — Z87891 Personal history of nicotine dependence: Secondary | ICD-10-CM | POA: Diagnosis not present

## 2021-11-23 DIAGNOSIS — D509 Iron deficiency anemia, unspecified: Secondary | ICD-10-CM

## 2021-11-23 DIAGNOSIS — K810 Acute cholecystitis: Secondary | ICD-10-CM

## 2021-11-23 DIAGNOSIS — R188 Other ascites: Secondary | ICD-10-CM | POA: Diagnosis not present

## 2021-11-23 DIAGNOSIS — K219 Gastro-esophageal reflux disease without esophagitis: Secondary | ICD-10-CM

## 2021-11-23 LAB — CBC
HCT: 37 % — ABNORMAL LOW (ref 39.0–52.0)
Hemoglobin: 12.4 g/dL — ABNORMAL LOW (ref 13.0–17.0)
MCH: 33.3 pg (ref 26.0–34.0)
MCHC: 33.5 g/dL (ref 30.0–36.0)
MCV: 99.5 fL (ref 80.0–100.0)
Platelets: 93 10*3/uL — ABNORMAL LOW (ref 150–400)
RBC: 3.72 MIL/uL — ABNORMAL LOW (ref 4.22–5.81)
RDW: 15.1 % (ref 11.5–15.5)
WBC: 8.8 10*3/uL (ref 4.0–10.5)
nRBC: 0 % (ref 0.0–0.2)

## 2021-11-23 LAB — COMPREHENSIVE METABOLIC PANEL
ALT: 133 U/L — ABNORMAL HIGH (ref 0–44)
AST: 144 U/L — ABNORMAL HIGH (ref 15–41)
Albumin: 2.6 g/dL — ABNORMAL LOW (ref 3.5–5.0)
Alkaline Phosphatase: 52 U/L (ref 38–126)
Anion gap: 8 (ref 5–15)
BUN: 36 mg/dL — ABNORMAL HIGH (ref 8–23)
CO2: 22 mmol/L (ref 22–32)
Calcium: 8.7 mg/dL — ABNORMAL LOW (ref 8.9–10.3)
Chloride: 103 mmol/L (ref 98–111)
Creatinine, Ser: 1.58 mg/dL — ABNORMAL HIGH (ref 0.61–1.24)
GFR, Estimated: 47 mL/min — ABNORMAL LOW (ref >60)
Glucose, Bld: 134 mg/dL — ABNORMAL HIGH (ref 70–99)
Potassium: 4.1 mmol/L (ref 3.5–5.1)
Sodium: 133 mmol/L — ABNORMAL LOW (ref 135–145)
Total Bilirubin: 2.8 mg/dL — ABNORMAL HIGH (ref 0.3–1.2)
Total Protein: 5.9 g/dL — ABNORMAL LOW (ref 6.5–8.1)

## 2021-11-23 LAB — PROTIME-INR
INR: 1.4 — ABNORMAL HIGH (ref 0.8–1.2)
Prothrombin Time: 17.4 seconds — ABNORMAL HIGH (ref 11.4–15.2)

## 2021-11-23 LAB — LIPASE, BLOOD: Lipase: 110 U/L — ABNORMAL HIGH (ref 11–51)

## 2021-11-23 NOTE — Telephone Encounter (Signed)
[place]:     Patient was discharged from [facility]. He was told to follow up with Dr Domenic Polite in 2 weeks per ex wife. There are no available appointments in either office with any provider or APP. Patient saays he can get a ride to [place]. She wants to know if patient can be worked in at either office please.

## 2021-11-23 NOTE — ED Notes (Addendum)
Pt states he is unable to provide a urine sample at this time. Urinal left at bedside

## 2021-11-23 NOTE — Telephone Encounter (Signed)
Transition Care Management Follow-up Telephone Call Date of discharge and from where: 11/22/21 - [facility] - acute calculous cholecystitis with elevated LFTs How have you been since you were released from the hospital? He is okay, but he weighs 184 today - a couple of weeks ago he weighed 154 - his belly is swollen like he's pregnant, legs are swollen, he can't eat much because he feels full constantly, and gets out of breath when he walks around Any questions or concerns? Yes - noted above  Items Reviewed: Did the pt receive and understand the discharge instructions provided? Yes  Medications obtained and verified? Yes  Other? No  Any new allergies since your discharge? No  Dietary orders reviewed? Yes Do you have support at home? Yes   Home Care and Equipment/Supplies: Were home health services ordered? no  Were any new equipment or medical supplies ordered?  No  Functional Questionnaire: (I = Independent and D = Dependent) ADLs: I  Bathing/Dressing- I  Meal Prep- I  Eating- I  Maintaining continence- I  Transferring/Ambulation- I  Managing Meds- I  Follow up appointments reviewed:  PCP Hospital f/u appt confirmed? Yes  Scheduled to see Shelah Lewandowsky on 12/01/21 @ 12:15. [place] [facility] f/u appt confirmed? Yes  Scheduled to see Laurann Montana @ [facility] on 12/12/21 @ 3:10 and Roseanne Kaufman 01/10/22 @ 8:30 Are transportation arrangements needed? No  If their condition worsens, is the pt aware to call PCP or go to the Emergency Dept.? Yes Was the patient provided with contact information for the PCP's office or ED? Yes Was to pt encouraged to call back with questions or concerns? Yes

## 2021-11-23 NOTE — ED Triage Notes (Signed)
D/C yesterday. Abd distention. Called due to hypotension. Family stated BP in the 80's. Alert/oriented.   BP 106/46  Hr 60 99 RA  CbG 113

## 2021-11-23 NOTE — Telephone Encounter (Signed)
Phoned the pt and spoke with his wife and she was advised of labs needing to be drawn and requested that it be [facility]. Pt's labs were put in and released.

## 2021-11-23 NOTE — ED Provider Notes (Signed)
[facility] Emergency Department Provider Note MRN:  [id number]  Arrival date & time: 11/24/21     Chief Complaint   Abdominal distention History of Present Illness   Edward [name] is a 71 y.o. year-old male with a history of CKD, CAD, diabetes presenting to the ED with chief complaint of abdominal distention.  Worsening abdominal distention and discomfort over the past few days.  Recently in the hospital for liver issues, gallbladder issues.  Denies fever, nausea but no vomiting.  Not having very many bowel movements.  Review of Systems  A thorough review of systems was obtained and all systems are negative except as noted in the HPI and PMH.   Patient's Health History    Past Medical History:  Diagnosis Date   Cataract    CKD (chronic kidney disease) stage 3, GFR 30-59 ml/min (HCC)    Coronary atherosclerosis of native coronary artery    BMS LAD 2006; residual 75% distal RCA; EF 50%   Essential hypertension    Glaucoma    Low back pain    MI (myocardial infarction) ([place])    Anterior 2006   Mixed hyperlipidemia    Renal insufficiency    Type 2 diabetes mellitus (HCC)     Past Surgical History:  Procedure Laterality Date   CATARACT EXTRACTION, BILATERAL     CORONARY ANGIOPLASTY WITH STENT PLACEMENT  2006    Family History  Problem Relation Age of Onset   Aneurysm Father        Brain aneurysm   Lung cancer Mother        Small cell carcinoma of lung,kidney and heart   Heart disease Mother    Cancer Brother        lungs   Diabetes Sister    Diabetes Sister    Diabetes Brother    Diabetes Brother     Social History   Socioeconomic History   Marital status: Legally Separated    Spouse name: [name]    Number of children: 2   Years of education: 7th   Highest education level: 7th grade  Occupational History   Occupation: Retired  Tobacco Use   Smoking status: Former    Packs/day: 2.00    Years: 20.00    Total pack years: 40.00     Types: Cigarettes    Quit date: 05/08/1987    Years since quitting: 34.5   Smokeless tobacco: Never  Vaping Use   Vaping Use: Never used  Substance and Sexual Activity   Alcohol use: No    Alcohol/week: 0.0 standard drinks of alcohol   Drug use: No   Sexual activity: Not Currently  Other Topics Concern   Not on file  Social History Narrative   Lives alone    Social Determinants of Health   Financial Resource Strain: Low Risk  (07/11/2021)   Overall Financial Resource Strain (CARDIA)    Difficulty of Paying Living Expenses: Not very hard  Food Insecurity: No Food Insecurity (07/11/2021)   Hunger Vital Sign    Worried About Running Out of Food in the Last Year: Never true    Ran Out of Food in the Last Year: Never true  Transportation Needs: No Transportation Needs (07/11/2021)   PRAPARE - Hydrologist (Medical): No    Lack of Transportation (Non-Medical): No  Physical Activity: Insufficiently Active (07/11/2021)   Exercise Vital Sign    Days of Exercise per Week: 7 days  Minutes of Exercise per Session: 20 min  Stress: No Stress Concern Present (07/11/2021)   [place]    Feeling of Stress : Not at all  Social Connections: Socially Isolated (07/11/2021)   Social Connection and Isolation Panel [NHANES]    Frequency of Communication with Friends and Family: More than three times a week    Frequency of Social Gatherings with Friends and Family: More than three times a week    Attends Religious Services: Never    Marine scientist or Organizations: No    Attends Archivist Meetings: Never    Marital Status: Separated  Intimate Partner Violence: Not At Risk (07/11/2021)   Humiliation, Afraid, Rape, and Kick questionnaire    Fear of Current or Ex-Partner: No    Emotionally Abused: No    Physically Abused: No    Sexually Abused: No     Physical Exam   Vitals:   11/24/21 0130  11/24/21 0145  BP: 127/64   Pulse: 92 77  Resp: 18 16  Temp:    SpO2: 97% 98%    CONSTITUTIONAL: Chronically ill appearing, NAD NEURO/PSYCH:  Alert and oriented x 3, no focal deficits EYES:  eyes equal and reactive ENT/NECK:  no LAD, no JVD CARDIO: Regular rate, well-perfused, normal S1 and S2 PULM:  CTAB no wheezing or rhonchi GI/GU: Moderately distended, tense, nontender MSK/SPINE:  No gross deformities, no edema SKIN:  no rash, atraumatic   *Additional and/or pertinent findings included in MDM below  Diagnostic and Interventional Summary    EKG Interpretation  Date/Time:    Ventricular Rate:    PR Interval:    QRS Duration:   QT Interval:    QTC Calculation:   R Axis:     Text Interpretation:         Labs Reviewed  LIPASE, BLOOD - Abnormal; Notable for the following components:      Result Value   Lipase 110 (*)    All other components within normal limits  COMPREHENSIVE METABOLIC PANEL - Abnormal; Notable for the following components:   Sodium 133 (*)    Glucose, Bld 134 (*)    BUN 36 (*)    Creatinine, Ser 1.58 (*)    Calcium 8.7 (*)    Total Protein 5.9 (*)    Albumin 2.6 (*)    AST 144 (*)    ALT 133 (*)    Total Bilirubin 2.8 (*)    GFR, Estimated 47 (*)    All other components within normal limits  CBC - Abnormal; Notable for the following components:   RBC 3.72 (*)    Hemoglobin 12.4 (*)    HCT 37.0 (*)    Platelets 93 (*)    All other components within normal limits  PROTIME-INR - Abnormal; Notable for the following components:   Prothrombin Time 17.4 (*)    INR 1.4 (*)    All other components within normal limits  URINALYSIS, ROUTINE W REFLEX MICROSCOPIC    CT ABDOMEN PELVIS W CONTRAST  Final Result      Medications  iohexol (OMNIPAQUE) 300 MG/ML solution 100 mL (100 mLs Intravenous Contrast Given 11/24/21 0011)     Procedures  /  Critical Care Procedures  ED Course and Medical Decision Making  Initial Impression and Ddx Recent  admission for hepatic dysfunction of unclear cause, being worked up for Wilson's disease.  Was also thought to have possible cholecystitis but this was  ruled out with HIDA scan.  Discharge after a few days of antibiotics.  Normal vital signs at this time, ambulating, does have a fairly rigid and distended abdomen at this time, will need CT imaging to evaluate for obstruction, perforated viscus, worsening of possible gallbladder pathology, liver pathology.  Past medical/surgical history that increases complexity of ED encounter: Cirrhosis  Interpretation of Diagnostics I personally reviewed the laboratory assessment and my interpretation is as follows:   largely baseline or unchanged blood counts, electrolytes, liver function, kidney function compared to prior   CT imaging largely nonacute, some increase in ascites  Patient Reassessment and Ultimate Disposition/Management     Patient feeling well, still has normal vital signs, no fever, no leukocytosis, doubt SBP or any other emergent process, admission offered and given the worsening nature of his ascites, cirrhosis of unclear cause but patient prefers to go home, will follow-up with primary care doctor.  Patient management required discussion with the following services or consulting groups:  None  Complexity of Problems Addressed Acute illness or injury that poses threat of life of bodily function  Additional Data Reviewed and Analyzed Further history obtained from: Further history from spouse/family member  Additional Factors Impacting ED Encounter Risk Consideration of hospitalization  Barth Kirks. Sedonia Small, MD [place] [email or web address]  Final Clinical Impressions(s) / ED Diagnoses     ICD-10-CM   1. Other ascites  R18.8       ED Discharge Orders     None        Discharge Instructions Discussed with and Provided to Patient:     Discharge Instructions      You were  evaluated in the Emergency Department and after careful evaluation, we did not find any emergent condition requiring admission or further testing in the hospital.  Your exam/testing today was overall reassuring.  Continue your Lasix at home and follow-up with your regular doctors.  Please return to the Emergency Department if you experience any worsening of your condition.  Thank you for allowing [place] to be a part of your care.        Maudie Flakes, MD 11/24/21 0200

## 2021-11-23 NOTE — Patient Outreach (Addendum)
  Care Coordination [facility] Note Transition Care Management Follow-up Telephone Call Date of discharge and from where: 11/22/21 [facility] How have you been since you were released from the hospital?  Patient states he feels "pretty good" other than the swelling in his legs Any questions or concerns? No  Items Reviewed: Did the pt receive and understand the discharge instructions provided? Yes  Medications obtained and verified? Yes  Other? No  Any new allergies since your discharge? No  Dietary orders reviewed? Yes Do you have support at home? Yes   Home Care and Equipment/Supplies: Were home health services ordered? no If so, what is the name of the agency? N/A  Has the agency set up a time to come to the patient's home? not applicable Were any new equipment or medical supplies ordered?  No What is the name of the medical supply agency? N/A Were you able to get the supplies/equipment? not applicable Do you have any questions related to the use of the equipment or supplies? No  Functional Questionnaire: (I = Independent and D = Dependent) ADLs: I  Bathing/Dressing- I  Meal Prep- WIFE ASSIST  Eating- I  Maintaining continence- I  Transferring/Ambulation- I  Managing Meds- I  Follow up appointments reviewed:  PCP Hospital f/u appt confirmed? No  , nurse will email Steffanie Carrkier Monia Pouch to inform [facility] f/u appt confirmed? Yes  Scheduled to see Cyndi Bender, NP GI on 01/10/22 @ 0830. Are transportation arrangements needed? No  If their condition worsens, is the pt aware to call PCP or go to the Emergency Dept.? Yes Was the patient provided with contact information for the PCP's office or ED? Yes Was to pt encouraged to call back with questions or concerns? Yes  SDOH assessments and interventions completed:   Yes  Care Coordination Interventions Activated:  No Care Coordination Interventions:   N/A  Encounter Outcome:  Pt. Visit Completed  Emelia Loron RN,  BSN [place] [phone] [email or web address]

## 2021-11-23 NOTE — Telephone Encounter (Signed)
Made for [facility] for 12/12/21

## 2021-11-23 NOTE — Telephone Encounter (Addendum)
Discharge summary states patient needs to see pcp in 10 days and GI in 2 weeks. There is no instruction for cardiology f/u as admission was non cardiac related admission per B.Strader, PA-C.   Can offer [facility] to patient.

## 2021-11-24 LAB — URINALYSIS, ROUTINE W REFLEX MICROSCOPIC
Bilirubin Urine: NEGATIVE
Glucose, UA: NEGATIVE mg/dL
Hgb urine dipstick: NEGATIVE
Ketones, ur: NEGATIVE mg/dL
Leukocytes,Ua: NEGATIVE
Nitrite: NEGATIVE
Protein, ur: NEGATIVE mg/dL
Specific Gravity, Urine: 1.021 (ref 1.005–1.030)
pH: 5 (ref 5.0–8.0)

## 2021-11-24 MED ORDER — IOHEXOL 300 MG/ML  SOLN
100.0000 mL | Freq: Once | INTRAMUSCULAR | Status: AC | PRN
  Administered 2021-11-24: 100 mL via INTRAVENOUS

## 2021-11-24 NOTE — ED Notes (Signed)
Patient transported to CT 

## 2021-11-24 NOTE — ED Notes (Signed)
ED Provider at bedside. 

## 2021-11-24 NOTE — Discharge Instructions (Signed)
You were evaluated in the Emergency Department and after careful evaluation, we did not find any emergent condition requiring admission or further testing in the hospital.  Your exam/testing today was overall reassuring.  Continue your Lasix at home and follow-up with your regular doctors.  Please return to the Emergency Department if you experience any worsening of your condition.  Thank you for allowing [place] to be a part of your care.

## 2021-11-27 ENCOUNTER — Encounter: Payer: Self-pay | Admitting: Family Medicine

## 2021-11-27 ENCOUNTER — Inpatient Hospital Stay (HOSPITAL_COMMUNITY)
Admission: EM | Admit: 2021-11-27 | Discharge: 2021-12-03 | DRG: 432 | Disposition: A | Discharge status: Other Acute Inpatient Hospital | Payer: 59 | Attending: Family Medicine | Admitting: Family Medicine

## 2021-11-27 ENCOUNTER — Emergency Department (HOSPITAL_COMMUNITY): Payer: 59

## 2021-11-27 ENCOUNTER — Ambulatory Visit (INDEPENDENT_AMBULATORY_CARE_PROVIDER_SITE_OTHER): Payer: 59 | Admitting: Family Medicine

## 2021-11-27 ENCOUNTER — Encounter (HOSPITAL_COMMUNITY): Payer: Self-pay | Admitting: Emergency Medicine

## 2021-11-27 ENCOUNTER — Other Ambulatory Visit: Payer: Self-pay

## 2021-11-27 VITALS — BP 90/48 | HR 85 | Temp 97.1°F | Ht 70.0 in | Wt 188.4 lb

## 2021-11-27 DIAGNOSIS — I252 Old myocardial infarction: Secondary | ICD-10-CM

## 2021-11-27 DIAGNOSIS — E162 Hypoglycemia, unspecified: Secondary | ICD-10-CM | POA: Diagnosis present

## 2021-11-27 DIAGNOSIS — Z955 Presence of coronary angioplasty implant and graft: Secondary | ICD-10-CM | POA: Diagnosis not present

## 2021-11-27 DIAGNOSIS — I129 Hypertensive chronic kidney disease with stage 1 through stage 4 chronic kidney disease, or unspecified chronic kidney disease: Secondary | ICD-10-CM | POA: Diagnosis present

## 2021-11-27 DIAGNOSIS — N179 Acute kidney failure, unspecified: Secondary | ICD-10-CM | POA: Diagnosis present

## 2021-11-27 DIAGNOSIS — D539 Nutritional anemia, unspecified: Secondary | ICD-10-CM | POA: Diagnosis present

## 2021-11-27 DIAGNOSIS — N1832 Chronic kidney disease, stage 3b: Secondary | ICD-10-CM | POA: Diagnosis present

## 2021-11-27 DIAGNOSIS — D689 Coagulation defect, unspecified: Secondary | ICD-10-CM | POA: Diagnosis present

## 2021-11-27 DIAGNOSIS — E876 Hypokalemia: Secondary | ICD-10-CM | POA: Diagnosis not present

## 2021-11-27 DIAGNOSIS — R601 Generalized edema: Secondary | ICD-10-CM | POA: Diagnosis not present

## 2021-11-27 DIAGNOSIS — M6282 Rhabdomyolysis: Secondary | ICD-10-CM

## 2021-11-27 DIAGNOSIS — R531 Weakness: Secondary | ICD-10-CM | POA: Diagnosis not present

## 2021-11-27 DIAGNOSIS — E1121 Type 2 diabetes mellitus with diabetic nephropathy: Secondary | ICD-10-CM | POA: Diagnosis not present

## 2021-11-27 DIAGNOSIS — K810 Acute cholecystitis: Secondary | ICD-10-CM

## 2021-11-27 DIAGNOSIS — E610 Copper deficiency: Secondary | ICD-10-CM | POA: Diagnosis present

## 2021-11-27 DIAGNOSIS — R188 Other ascites: Secondary | ICD-10-CM

## 2021-11-27 DIAGNOSIS — K766 Portal hypertension: Secondary | ICD-10-CM

## 2021-11-27 DIAGNOSIS — E1122 Type 2 diabetes mellitus with diabetic chronic kidney disease: Secondary | ICD-10-CM | POA: Diagnosis present

## 2021-11-27 DIAGNOSIS — E782 Mixed hyperlipidemia: Secondary | ICD-10-CM | POA: Diagnosis present

## 2021-11-27 DIAGNOSIS — K7460 Unspecified cirrhosis of liver: Secondary | ICD-10-CM

## 2021-11-27 DIAGNOSIS — I959 Hypotension, unspecified: Secondary | ICD-10-CM

## 2021-11-27 DIAGNOSIS — E8809 Other disorders of plasma-protein metabolism, not elsewhere classified: Secondary | ICD-10-CM | POA: Diagnosis present

## 2021-11-27 DIAGNOSIS — Z79899 Other long term (current) drug therapy: Secondary | ICD-10-CM

## 2021-11-27 DIAGNOSIS — K8010 Calculus of gallbladder with chronic cholecystitis without obstruction: Secondary | ICD-10-CM | POA: Diagnosis present

## 2021-11-27 DIAGNOSIS — K7469 Other cirrhosis of liver: Principal | ICD-10-CM | POA: Diagnosis present

## 2021-11-27 DIAGNOSIS — E871 Hypo-osmolality and hyponatremia: Secondary | ICD-10-CM | POA: Diagnosis present

## 2021-11-27 DIAGNOSIS — I2510 Atherosclerotic heart disease of native coronary artery without angina pectoris: Secondary | ICD-10-CM | POA: Diagnosis present

## 2021-11-27 DIAGNOSIS — K7290 Hepatic failure, unspecified without coma: Secondary | ICD-10-CM | POA: Diagnosis present

## 2021-11-27 DIAGNOSIS — Z7982 Long term (current) use of aspirin: Secondary | ICD-10-CM

## 2021-11-27 DIAGNOSIS — Z7984 Long term (current) use of oral hypoglycemic drugs: Secondary | ICD-10-CM

## 2021-11-27 DIAGNOSIS — E11649 Type 2 diabetes mellitus with hypoglycemia without coma: Secondary | ICD-10-CM | POA: Diagnosis present

## 2021-11-27 DIAGNOSIS — J189 Pneumonia, unspecified organism: Secondary | ICD-10-CM | POA: Diagnosis present

## 2021-11-27 DIAGNOSIS — D61818 Other pancytopenia: Secondary | ICD-10-CM | POA: Diagnosis not present

## 2021-11-27 DIAGNOSIS — H409 Unspecified glaucoma: Secondary | ICD-10-CM | POA: Diagnosis present

## 2021-11-27 DIAGNOSIS — E8770 Fluid overload, unspecified: Secondary | ICD-10-CM | POA: Diagnosis present

## 2021-11-27 DIAGNOSIS — Z8249 Family history of ischemic heart disease and other diseases of the circulatory system: Secondary | ICD-10-CM

## 2021-11-27 DIAGNOSIS — K7682 Hepatic encephalopathy: Secondary | ICD-10-CM | POA: Diagnosis present

## 2021-11-27 DIAGNOSIS — K209 Esophagitis, unspecified without bleeding: Secondary | ICD-10-CM | POA: Diagnosis present

## 2021-11-27 DIAGNOSIS — Z7689 Persons encountering health services in other specified circumstances: Secondary | ICD-10-CM

## 2021-11-27 DIAGNOSIS — Z87891 Personal history of nicotine dependence: Secondary | ICD-10-CM

## 2021-11-27 DIAGNOSIS — Z833 Family history of diabetes mellitus: Secondary | ICD-10-CM

## 2021-11-27 DIAGNOSIS — D7589 Other specified diseases of blood and blood-forming organs: Secondary | ICD-10-CM | POA: Diagnosis present

## 2021-11-27 LAB — COMPREHENSIVE METABOLIC PANEL
ALT: 101 U/L — ABNORMAL HIGH (ref 0–44)
AST: 113 U/L — ABNORMAL HIGH (ref 15–41)
Albumin: 2.5 g/dL — ABNORMAL LOW (ref 3.5–5.0)
Alkaline Phosphatase: 60 U/L (ref 38–126)
Anion gap: 11 (ref 5–15)
BUN: 47 mg/dL — ABNORMAL HIGH (ref 8–23)
CO2: 21 mmol/L — ABNORMAL LOW (ref 22–32)
Calcium: 8.4 mg/dL — ABNORMAL LOW (ref 8.9–10.3)
Chloride: 99 mmol/L (ref 98–111)
Creatinine, Ser: 2.64 mg/dL — ABNORMAL HIGH (ref 0.61–1.24)
GFR, Estimated: 25 mL/min — ABNORMAL LOW (ref >60)
Glucose, Bld: 47 mg/dL — ABNORMAL LOW (ref 70–99)
Potassium: 3.8 mmol/L (ref 3.5–5.1)
Sodium: 131 mmol/L — ABNORMAL LOW (ref 135–145)
Total Bilirubin: 2.2 mg/dL — ABNORMAL HIGH (ref 0.3–1.2)
Total Protein: 5.8 g/dL — ABNORMAL LOW (ref 6.5–8.1)

## 2021-11-27 LAB — PROTIME-INR
INR: 1.4 — ABNORMAL HIGH (ref 0.8–1.2)
Prothrombin Time: 16.7 seconds — ABNORMAL HIGH (ref 11.4–15.2)

## 2021-11-27 LAB — CBC WITH DIFFERENTIAL/PLATELET
Abs Immature Granulocytes: 0.03 10*3/uL (ref 0.00–0.07)
Basophils Absolute: 0 10*3/uL (ref 0.0–0.1)
Basophils Relative: 0 %
Eosinophils Absolute: 0.1 10*3/uL (ref 0.0–0.5)
Eosinophils Relative: 1 %
HCT: 34 % — ABNORMAL LOW (ref 39.0–52.0)
Hemoglobin: 11.6 g/dL — ABNORMAL LOW (ref 13.0–17.0)
Immature Granulocytes: 0 %
Lymphocytes Relative: 18 %
Lymphs Abs: 1.5 10*3/uL (ref 0.7–4.0)
MCH: 33.6 pg (ref 26.0–34.0)
MCHC: 34.1 g/dL (ref 30.0–36.0)
MCV: 98.6 fL (ref 80.0–100.0)
Monocytes Absolute: 0.7 10*3/uL (ref 0.1–1.0)
Monocytes Relative: 9 %
Neutro Abs: 6 10*3/uL (ref 1.7–7.7)
Neutrophils Relative %: 72 %
Platelets: 97 10*3/uL — ABNORMAL LOW (ref 150–400)
RBC: 3.45 MIL/uL — ABNORMAL LOW (ref 4.22–5.81)
RDW: 16.1 % — ABNORMAL HIGH (ref 11.5–15.5)
WBC: 8.4 10*3/uL (ref 4.0–10.5)
nRBC: 0 % (ref 0.0–0.2)

## 2021-11-27 LAB — URINALYSIS, ROUTINE W REFLEX MICROSCOPIC
Bilirubin Urine: NEGATIVE
Glucose, UA: NEGATIVE mg/dL
Hgb urine dipstick: NEGATIVE
Ketones, ur: NEGATIVE mg/dL
Leukocytes,Ua: NEGATIVE
Nitrite: NEGATIVE
Protein, ur: NEGATIVE mg/dL
Specific Gravity, Urine: 1.016 (ref 1.005–1.030)
pH: 5 (ref 5.0–8.0)

## 2021-11-27 LAB — CK: Total CK: 367 U/L (ref 49–397)

## 2021-11-27 LAB — TROPONIN I (HIGH SENSITIVITY)
Troponin I (High Sensitivity): 12 ng/L (ref <18)
Troponin I (High Sensitivity): 11 ng/L (ref <18)

## 2021-11-27 LAB — LACTIC ACID, PLASMA: Lactic Acid, Venous: 1.9 mmol/L (ref 0.5–1.9)

## 2021-11-27 LAB — HEMOGLOBIN A1C
Hgb A1c MFr Bld: 6.5 % — ABNORMAL HIGH (ref 4.8–5.6)
Mean Plasma Glucose: 139.85 mg/dL

## 2021-11-27 LAB — CBG MONITORING, ED
Glucose-Capillary: 51 mg/dL — ABNORMAL LOW (ref 70–99)
Glucose-Capillary: 66 mg/dL — ABNORMAL LOW (ref 70–99)

## 2021-11-27 LAB — GLUCOSE, CAPILLARY: Glucose-Capillary: 76 mg/dL (ref 70–99)

## 2021-11-27 LAB — LIPASE, BLOOD: Lipase: 151 U/L — ABNORMAL HIGH (ref 11–51)

## 2021-11-27 LAB — MAGNESIUM: Magnesium: 2.5 mg/dL — ABNORMAL HIGH (ref 1.7–2.4)

## 2021-11-27 MED ORDER — TRAZODONE HCL 50 MG PO TABS
100.0000 mg | ORAL_TABLET | Freq: Every day | ORAL | Status: DC
  Administered 2021-11-27 – 2021-12-02 (×6): 100 mg via ORAL
  Filled 2021-11-27 (×6): qty 2

## 2021-11-27 MED ORDER — TRAZODONE HCL 50 MG PO TABS: 50.0000 mg | ORAL_TABLET | Freq: Every evening | ORAL | Status: DC | PRN

## 2021-11-27 MED ORDER — SODIUM CHLORIDE 0.9 % IV SOLN
INTRAVENOUS | Status: DC | PRN
Start: 2021-11-27 — End: 2021-12-03

## 2021-11-27 MED ORDER — SODIUM CHLORIDE 0.9% FLUSH: 3.0000 mL | INTRAVENOUS | Status: DC | PRN

## 2021-11-27 MED ORDER — SODIUM CHLORIDE 0.9 % IV SOLN
500.0000 mg | INTRAVENOUS | Status: DC
  Administered 2021-11-28 – 2021-12-01 (×5): 500 mg via INTRAVENOUS
  Filled 2021-11-27 (×6): qty 5

## 2021-11-27 MED ORDER — FUROSEMIDE 10 MG/ML IJ SOLN
INTRAMUSCULAR | Status: AC
  Filled 2021-11-27: qty 20

## 2021-11-27 MED ORDER — FUROSEMIDE 10 MG/ML IJ SOLN
4.0000 mg/h | INTRAVENOUS | Status: DC
  Administered 2021-11-27: 4 mg/h via INTRAVENOUS
  Filled 2021-11-27: qty 20

## 2021-11-27 MED ORDER — LACTULOSE 10 GM/15ML PO SOLN
5.0000 g | Freq: Every day | ORAL | Status: DC
  Administered 2021-11-27 – 2021-11-28 (×2): 5 g via ORAL
  Filled 2021-11-27 (×3): qty 30

## 2021-11-27 MED ORDER — HEPARIN SODIUM (PORCINE) 5000 UNIT/ML IJ SOLN
5000.0000 [IU] | Freq: Three times a day (TID) | INTRAMUSCULAR | Status: DC
Start: 2021-11-27 — End: 2021-12-01
  Administered 2021-11-27 – 2021-12-01 (×11): 5000 [IU] via SUBCUTANEOUS
  Filled 2021-11-27 (×11): qty 1

## 2021-11-27 MED ORDER — GLUCOSE 4 G PO CHEW
1.0000 | CHEWABLE_TABLET | Freq: Once | ORAL | Status: AC
  Administered 2021-11-27: 4 g via ORAL
  Filled 2021-11-27: qty 1

## 2021-11-27 MED ORDER — ACETAMINOPHEN 325 MG PO TABS: 650.0000 mg | ORAL_TABLET | Freq: Four times a day (QID) | ORAL | Status: DC | PRN

## 2021-11-27 MED ORDER — BISACODYL 10 MG RE SUPP: 10.0000 mg | Freq: Every day | RECTAL | Status: DC | PRN

## 2021-11-27 MED ORDER — POLYETHYLENE GLYCOL 3350 17 G PO PACK: 17.0000 g | PACK | Freq: Every day | ORAL | Status: DC | PRN

## 2021-11-27 MED ORDER — SODIUM CHLORIDE 0.9 % IV SOLN
1.0000 g | INTRAVENOUS | Status: DC
  Administered 2021-11-28 – 2021-12-03 (×6): 1 g via INTRAVENOUS
  Filled 2021-11-27 (×6): qty 10

## 2021-11-27 MED ORDER — SODIUM BICARBONATE 650 MG PO TABS
650.0000 mg | ORAL_TABLET | Freq: Three times a day (TID) | ORAL | Status: DC
  Administered 2021-11-27 – 2021-11-28 (×2): 650 mg via ORAL
  Filled 2021-11-27 (×2): qty 1

## 2021-11-27 MED ORDER — ALBUTEROL SULFATE (2.5 MG/3ML) 0.083% IN NEBU: 2.5000 mg | INHALATION_SOLUTION | RESPIRATORY_TRACT | Status: DC | PRN

## 2021-11-27 MED ORDER — ACETAMINOPHEN 650 MG RE SUPP: 650.0000 mg | Freq: Four times a day (QID) | RECTAL | Status: DC | PRN

## 2021-11-27 MED ORDER — SODIUM CHLORIDE 0.9% FLUSH
3.0000 mL | Freq: Two times a day (BID) | INTRAVENOUS | Status: DC
Start: 2021-11-27 — End: 2021-12-03
  Administered 2021-11-28 – 2021-12-02 (×7): 3 mL via INTRAVENOUS

## 2021-11-27 MED ORDER — ONDANSETRON HCL 4 MG/2ML IJ SOLN: 4.0000 mg | Freq: Four times a day (QID) | INTRAMUSCULAR | Status: DC | PRN

## 2021-11-27 MED ORDER — PANTOPRAZOLE SODIUM 40 MG PO TBEC
40.0000 mg | DELAYED_RELEASE_TABLET | Freq: Every day | ORAL | Status: DC
  Administered 2021-11-27 – 2021-12-02 (×6): 40 mg via ORAL
  Filled 2021-11-27 (×6): qty 1

## 2021-11-27 MED ORDER — INSULIN ASPART 100 UNIT/ML IJ SOLN: 0.0000 [IU] | Freq: Three times a day (TID) | INTRAMUSCULAR | Status: DC

## 2021-11-27 MED ORDER — INSULIN ASPART 100 UNIT/ML IJ SOLN: 0.0000 [IU] | Freq: Every day | INTRAMUSCULAR | Status: DC

## 2021-11-27 MED ORDER — BRIMONIDINE TARTRATE 0.2 % OP SOLN
1.0000 [drp] | Freq: Two times a day (BID) | OPHTHALMIC | Status: DC
  Administered 2021-11-27 – 2021-12-02 (×11): 1 [drp] via OPHTHALMIC
  Filled 2021-11-27: qty 5

## 2021-11-27 MED ORDER — SODIUM CHLORIDE 0.9% FLUSH
3.0000 mL | Freq: Two times a day (BID) | INTRAVENOUS | Status: DC
  Administered 2021-11-27 – 2021-12-02 (×8): 3 mL via INTRAVENOUS

## 2021-11-27 MED ORDER — ONDANSETRON HCL 4 MG PO TABS: 4.0000 mg | ORAL_TABLET | Freq: Four times a day (QID) | ORAL | Status: DC | PRN

## 2021-11-27 MED ORDER — DM-GUAIFENESIN ER 30-600 MG PO TB12
1.0000 | ORAL_TABLET | Freq: Two times a day (BID) | ORAL | Status: DC
Start: 2021-11-28 — End: 2021-12-03
  Administered 2021-11-28 – 2021-12-02 (×11): 1 via ORAL
  Filled 2021-11-27 (×11): qty 1

## 2021-11-27 MED ORDER — METOPROLOL TARTRATE 25 MG PO TABS
12.5000 mg | ORAL_TABLET | Freq: Two times a day (BID) | ORAL | Status: DC
  Administered 2021-11-27: 12.5 mg via ORAL
  Filled 2021-11-27: qty 1

## 2021-11-27 MED ORDER — ASPIRIN 81 MG PO CHEW
81.0000 mg | CHEWABLE_TABLET | Freq: Every day | ORAL | Status: DC
  Administered 2021-11-27 – 2021-12-02 (×6): 81 mg via ORAL
  Filled 2021-11-27 (×9): qty 1

## 2021-11-27 NOTE — ED Provider Notes (Signed)
[facility] Provider Note   CSN: [id number] Arrival date & time: 11/27/21  1327     History {Add pertinent medical, surgical, social history, OB history to HPI:1} Chief Complaint  Patient presents with   Hypotension    Edward [name] is a 71 y.o. male.  He has a history of CKD and fairly new diagnosis of cirrhosis.  He was recently in the hospital with weakness.  Seen again about 4 days ago for increased edema.  CT at that time showed increased ascites.  Continues to have swelling of his legs and genitals, shortness of breath at times.  No fevers or chills.  Saw his primary care doctor today who found his blood pressure to be low and recommended he come to the emergency department for further evaluation.  Has had some weight gain.  The history is provided by the patient and the spouse.  Weakness Severity:  Moderate Onset quality:  Gradual Timing:  Constant Progression:  Worsening Chronicity:  Recurrent Relieved by:  Nothing Worsened by:  Activity Ineffective treatments:  None tried Associated symptoms: difficulty walking and nausea   Associated symptoms: no abdominal pain, no chest pain, no cough, no dysuria, no falls, no fever, no foul-smelling urine and no vomiting        Home Medications Prior to Admission medications   Medication Sig Start Date End Date Taking? Authorizing Provider  ACCU-CHEK AVIVA PLUS test strip USE TO CHECK BLOOD SUGAR ONCE DAILY 11/12/17   Hassell Done, Mary-Margaret, FNP  acetaminophen (TYLENOL) 500 MG tablet Take 500 mg by mouth every 6 (six) hours as needed.    [provider]  aspirin 81 MG tablet Take 81 mg by mouth daily.    [provider]  brimonidine (ALPHAGAN) 0.2 % ophthalmic solution Place 1 drop into both eyes 2 (two) times daily. 03/14/20   [provider]  Cholecalciferol (VITAMIN D3) 2000 UNITS TABS Take 1 tablet by mouth daily.    [provider]  Cyanocobalamin (B-12 PO) Take by mouth.     [provider]  feeding supplement (ENSURE ENLIVE / ENSURE PLUS) LIQD Take 237 mLs by mouth 2 (two) times daily between meals. 11/22/21   Barton Dubois, MD  ferrous sulfate 325 (65 FE) MG tablet Take 1 tablet (325 mg total) by mouth daily with breakfast. Patient not taking: Reported on 11/27/2021 11/23/21   Barton Dubois, MD  gemfibrozil (LOPID) 600 MG tablet TAKE 1 TABLET BY MOUTH TWICE DAILY BEFORE MEAL(S) 08/15/21   Hassell Done, Mary-Margaret, FNP  glipiZIDE (GLUCOTROL) 5 MG tablet 2 tablets in morning and 1 tablet at night Patient taking differently: Take 5 mg by mouth See admin instructions. 2 tablets in morning and 1 tablet at evening 08/15/21   Hassell Done, Mary-Margaret, FNP  isosorbide mononitrate (IMDUR) 30 MG 24 hr tablet Take 1 tablet (30 mg total) by mouth daily. New 01/30/2018 08/15/21 11/18/21  Hassell Done, Mary-Margaret, FNP  lactulose (CHRONULAC) 10 GM/15ML solution Take 7.5 mLs (5 g total) by mouth daily. 11/23/21   Barton Dubois, MD  metoprolol tartrate (LOPRESSOR) 25 MG tablet Take 0.5 tablets (12.5 mg total) by mouth in the morning and at bedtime. 08/15/21 08/15/22  Hassell Done, Mary-Margaret, FNP  nitroGLYCERIN (NITROSTAT) 0.4 MG SL tablet Place 1 tablet (0.4 mg total) under the tongue every 5 (five) minutes x 3 doses as needed for chest pain (if no relief after 2nd dose, proceed to the ED for an evaluation). 10/06/20   Satira Sark, MD  Omega-3 Fatty Acids (  FISH OIL) 1000 MG CAPS Take by mouth.    [provider]  pantoprazole (PROTONIX) 40 MG tablet Take 1 tablet (40 mg total) by mouth daily. 08/15/21   Hassell Done, Mary-Margaret, FNP  polyethylene glycol (MIRALAX / GLYCOLAX) 17 g packet Take 17 g by mouth daily as needed for mild constipation. 11/22/21   Barton Dubois, MD  sodium bicarbonate 650 MG tablet Take 1 tablet (650 mg total) by mouth 3 (three) times daily. 11/22/21   Barton Dubois, MD  traZODone (DESYREL) 100 MG tablet Take 1 tablet (100 mg total) by mouth at bedtime. 08/15/21    [name], FNP      Allergies    Patient has no known allergies.    Review of Systems   Review of Systems  Constitutional:  Negative for fever.  HENT:  Negative for sore throat.   Eyes:  Negative for visual disturbance.  Respiratory:  Negative for cough.   Cardiovascular:  Positive for leg swelling. Negative for chest pain.  Gastrointestinal:  Positive for nausea. Negative for abdominal pain and vomiting.  Genitourinary:  Negative for dysuria.  Musculoskeletal:  Positive for gait problem. Negative for falls.  Neurological:  Positive for weakness.    Physical Exam Updated Vital Signs BP 107/61   Pulse 69   Temp (!) 97.5 F (36.4 C) (Oral)   Resp (!) 22   Ht '5\' 10"'$  (1.778 m)   Wt 85.7 kg   SpO2 98%   BMI 27.12 kg/m  Physical Exam Vitals and nursing note reviewed.  Constitutional:      General: He is not in acute distress.    Appearance: Normal appearance. He is well-developed.  HENT:     Head: Normocephalic and atraumatic.  Eyes:     Conjunctiva/sclera: Conjunctivae normal.  Cardiovascular:     Rate and Rhythm: Normal rate and regular rhythm.     Heart sounds: No murmur heard. Pulmonary:     Effort: Pulmonary effort is normal. No respiratory distress.     Breath sounds: Normal breath sounds.  Abdominal:     General: There is distension.     Palpations: Abdomen is soft.     Tenderness: There is no abdominal tenderness. There is no guarding or rebound.     Comments: He has some lower abdominal bruising likely from subcu injections  Musculoskeletal:     Cervical back: Neck supple.     Right lower leg: Edema present.     Left lower leg: Edema present.  Skin:    General: Skin is warm and dry.     Capillary Refill: Capillary refill takes less than 2 seconds.  Neurological:     General: No focal deficit present.     Mental Status: He is alert.     ED Results / Procedures / Treatments   Labs (all labs ordered are listed, but only abnormal results  are displayed) Labs Reviewed - No data to display  EKG None  Radiology No results found.  Procedures Procedures  {Document cardiac monitor, telemetry assessment procedure when appropriate:1}  Medications Ordered in ED Medications - No data to display  ED Course/ Medical Decision Making/ A&P                           Medical Decision Making Amount and/or Complexity of Data Reviewed Labs: ordered. Radiology: ordered.  This patient complains of ***; this involves an extensive number of treatment Options and is a complaint that carries  with it a high risk of complications and morbidity. The differential includes ***  I ordered, reviewed and interpreted labs, which included *** I ordered medication *** and reviewed PMP when indicated. I ordered imaging studies which included *** and I independently    visualized and interpreted imaging which showed *** Additional history obtained from *** Previous records obtained and reviewed *** I consulted *** and discussed lab and imaging findings and discussed disposition.  Cardiac monitoring reviewed, *** Social determinants considered, *** Critical Interventions: ***  After the interventions stated above, I reevaluated the patient and found *** Admission and further testing considered, ***    {Document critical care time when appropriate:1} {Document review of labs and clinical decision tools ie heart score, Chads2Vasc2 etc:1}  {Document your independent review of radiology images, and any outside records:1} {Document your discussion with family members, caretakers, and with consultants:1} {Document social determinants of health affecting pt's care:1} {Document your decision making why or why not admission, treatments were needed:1} Final Clinical Impression(s) / ED Diagnoses Final diagnoses:  None    Rx / DC Orders ED Discharge Orders     None

## 2021-11-27 NOTE — Progress Notes (Signed)
Established Patient Office Visit  Subjective   Patient ID: Edward [name], male    DOB: [birth year]  Age: 71 y.o. MRN: [id number]  Chief Complaint  Patient presents with   Transitions Of Care    HPI  Today's visit was for Transitional Care Management.  The patient was discharged from [facility] on 11/22/21 with a primary diagnosis of weakness.   Contact with the patient and/or caregiver, by a clinical staff member, was made on 11/23/21 and was documented as a telephone encounter within the EMR.  Through chart review and discussion with the patient I have determined that management of their condition is of high complexity.   Hartman presented to the ER at [facility] for increased weakness, faituge, and malaise, and nausea. CT of abdomen showed liver cirrhosis with portal hypertension with small volume ascites along with splenomegaly and gallstone cholelithiasis. HIDA scan showed no singed of acute cholecystitis.  He was observed for 48 hours of antibiotics. Elevated LFTs and bilirubin were noted. His urine copper level was elevated as well. He was told to hold copper supplements and stop atorvastatin.   He then presented to the ER at [facility] on 11/23/21 for abdominal distension. CT was done that showed increase in ascites. He was offered admission but he chose to be discharged.  Today he reports increased abdominal distension. He also reports increased swelling in his legs that extends up to his pelvic region. He reports pressure around his chest. He feels very fatigued and weak. Reports nausea but no vomiting, diarrhea, or fever. He has no appetite because his stomach feels so full and has only had about 20 fl oz in the last 24 hours. He feels a little short of breath as well. He reports that he feels terrible.    Past Medical History:  Diagnosis Date   Cataract    CKD (chronic kidney disease) stage 3, GFR 30-59 ml/min (HCC)    Coronary atherosclerosis of native coronary artery    BMS LAD 2006;  residual 75% distal RCA; EF 50%   Essential hypertension    Glaucoma    Low back pain    MI (myocardial infarction) ([place])    Anterior 2006   Mixed hyperlipidemia    Renal insufficiency    Type 2 diabetes mellitus ([place])       ROS As per HPI.    Objective:     BP (!) 90/48   Pulse 85   Temp (!) 97.1 F (36.2 C) (Oral)   Ht '5\' 10"'$  (1.778 m)   Wt 188 lb 6 oz (85.4 kg)   SpO2 95%   BMI 27.03 kg/m  BP Readings from Last 3 Encounters:  11/27/21 (!) 90/48  11/24/21 (!) 111/55  11/22/21 (!) 133/57   Wt Readings from Last 3 Encounters:  11/27/21 188 lb 6 oz (85.4 kg)  11/23/21 185 lb (83.9 kg)  11/22/21 173 lb 4.5 oz (78.6 kg)       Physical Exam Vitals and nursing note reviewed.  Constitutional:      General: He is not in acute distress.    Appearance: He is ill-appearing. He is not toxic-appearing or diaphoretic.  Eyes:     General: No scleral icterus.    Pupils: Pupils are equal, round, and reactive to light.  Cardiovascular:     Rate and Rhythm: Normal rate and regular rhythm.     Heart sounds: Normal heart sounds. No murmur heard. Pulmonary:     Effort: Pulmonary effort is normal.  Breath sounds: Rales present.  Abdominal:     General: There is distension.     Tenderness: There is abdominal tenderness.  Musculoskeletal:     Right lower leg: 3+ Edema present.     Left lower leg: 3+ Edema present.     Comments: 3+ pitting edema to bilateral upper and lower legs as well as ankles and feet  Skin:    General: Skin is warm and dry.     Coloration: Skin is not jaundiced.     Comments: Large fluid blister to LLE  Neurological:     General: No focal deficit present.     Mental Status: He is alert and oriented to person, place, and time.     Motor: Weakness (generalized) present.  Psychiatric:        Mood and Affect: Mood normal.        Behavior: Behavior normal.      No results found for any visits on 11/27/21.  Last CBC Lab Results  Component  Value Date   WBC 8.8 11/23/2021   HGB 12.4 (L) 11/23/2021   HCT 37.0 (L) 11/23/2021   MCV 99.5 11/23/2021   MCH 33.3 11/23/2021   RDW 15.1 11/23/2021   PLT 93 (L) 42/87/6811   Last metabolic panel Lab Results  Component Value Date   GLUCOSE 134 (H) 11/23/2021   NA 133 (L) 11/23/2021   K 4.1 11/23/2021   CL 103 11/23/2021   CO2 22 11/23/2021   BUN 36 (H) 11/23/2021   CREATININE 1.58 (H) 11/23/2021   GFRNONAA 47 (L) 11/23/2021   CALCIUM 8.7 (L) 11/23/2021   PROT 5.9 (L) 11/23/2021   ALBUMIN 2.6 (L) 11/23/2021   LABGLOB 2.6 11/14/2021   AGRATIO 1.3 11/14/2021   BILITOT 2.8 (H) 11/23/2021   ALKPHOS 52 11/23/2021   AST 144 (H) 11/23/2021   ALT 133 (H) 11/23/2021   ANIONGAP 8 11/23/2021   Last lipids Lab Results  Component Value Date   CHOL 98 (L) 11/14/2021   HDL 18 (L) 11/14/2021   LDLCALC 59 11/14/2021   TRIG 108 11/14/2021   CHOLHDL 5.4 (H) 11/14/2021      The ASCVD Risk score (Arnett DK, et al., 2019) failed to calculate for the following reasons:   The valid HDL cholesterol range is 20 to 100 mg/dL   The valid total cholesterol range is 130 to 320 mg/dL    Assessment & Plan:   Previn was seen today for transitions of care.  Diagnoses and all orders for this visit:  Encounter for support and coordination of transition of care Weakness Acute Calculous cholecystitis with Elevated LFTs Cirrhosis of liver with ascites, unspecified hepatic cirrhosis type ([place]) Portal hypertension ([place]) Non-traumatic rhabdomyolysis Hypotension, unspecified hypotension type  Instructed to return to the ER for further evaluation of increased ascites, increased edema, weakness, new shortness of breath, with new hypotension. He has gained an additional 3 lbs in the last few days. Very ill appearing today on exam. Offered to EMS, however he declined and will have his wife take him today.   The patient indicates understanding of these issues and agrees with the plan.   Gwenlyn Perking, FNP

## 2021-11-27 NOTE — ED Notes (Signed)
Pt states 45 lb weight gain from fluid

## 2021-11-27 NOTE — Patient Instructions (Signed)
Go to ER for low blood pressure and increased edema, ascites

## 2021-11-27 NOTE — H&P (Addendum)
Patient Demographics:    Edward [name], is a 71 y.o. male  MRN: [id number]   DOB - [birth year]  Admit Date - 11/27/2021  Outpatient Primary MD for the patient is [name], FNP   Assessment & Plan:   Assessment and Plan:   1)Volume Overload/Anasarca--- patient was recently admitted and had several days of IV fluids due to elevated CKs--he was discharged on 11/23/2021 -Now presenting with significant lower extremity edema including scrotal and penile edema -Blood pressure is soft -Unable to give bolus Lasix -Okay to start IV Lasix drip to gently diurese him  2)AKI----acute kidney injury on CKD stage -3A    --Creatinine is up to 2.64 creatinine was 1.58 on 11/23/2021 -Watch renal function closely with diuresis - renally adjust medications, avoid nephrotoxic agents / dehydration  / hypotension -Patient usually follows with Dr. Theador Hawthorne  3)Liver Cirrhosis with Ascites and Portal Hypertension---??? Etiology -No significant alcohol use -LFTs continue to trend down since discharge, INR 1.4 which is close to patient's baseline -Patient was previously on atorvastatin and copper supplements -Viral hepatitis profile was negative (negative for hep C and hep B may have had hep A infection in the past with antibodies noted) --Ultrasound from 12-21-2021 reveals not enough fluid for paracentesis -Patient work-up by GI service included ANA, mitochondrial antibodies, anti-smooth antibodies, IgG IgA and IgM - -Would benefit from EGD and liver biopsy as outpatient    4)-cholelithiasis - CT abdomen and pelvis and right upper quadrant ultrasound consistent with gallstones and cholecystitis -Recent surgical consult appreciated -Recent Gi consult appreciated HIDA scan with cystic duct patency noted- -May need liver biopsy  as outpatient -EGD as outpatient to rule out varices    5)History of copper deficiency--- has been on copper replacement for over 1 year -Random Urine Copper high at 72 (09/12/21) -Hold copper tablets as copper toxicity can lead to hepatic dysfunction -Discussed with patient's hematologist Dr. Delton Coombes--- liver biopsy will be able to tell if patient has copper toxicity or not   6)Anemia and Thrombocytopenia--- with macrocytosis --diagnosed in May 2021 -- SPEP in June 2021 was normal.  Erythropoietin normal at 11.9. -Previously declined bone marrow biopsy for evaluation of possible early MDS -Previously treated with copper supplements -Prior work-up revealed somewhat low iron --- B12 and Folate were Not low -hemoglobin 11.6 which is close to prior baseline platelets 97 which is higher than prior baseline   7)8)DM2---  A1c- 6.7 on 11/14/2021.[name]  Reflecting good diabetic control PTA -Patient having episodes of hypoglycemia so watch closely Use Novolog/Humalog Sliding scale insulin with Accu-Cheks/Fingersticks as ordered    8)-Esophagitis--- recent CT chest shows -Patulous and thickened esophagus. -Protonix as ordered EGD as outpatient to be done as noted above #1   9)Social/ethics--- Discussed with patient's ex-wife and and sister-in-law at bedside -Patient is a full code   10)HypoGlycemia----due to poor oral intake in setting of underlying liver cirrhosis  -feed liberally and frequently   11)CAP_-patient with some  shortness of breath, chest x-ray suggestive of pneumonia treat empirically with Rocephin/azithromycin along with bronchodilators and mucolytic  Disposition/Need for in-Hospital Stay- patient unable to be discharged at this time due to -anasarca/volume overload requiring gentle IV diuresis in the setting of soft BP  Status is: Inpatient  Remains inpatient appropriate because:   Dispo: The patient is from: Home              Anticipated d/c is to: Home               Anticipated d/c date is: 3 days              Patient currently is not medically stable to d/c. Barriers: Not Clinically Stable-    With History of - Reviewed by me  Past Medical History:  Diagnosis Date   Cataract    CKD (chronic kidney disease) stage 3, GFR 30-59 ml/min (HCC)    Coronary atherosclerosis of native coronary artery    BMS LAD 2006; residual 75% distal RCA; EF 50%   Essential hypertension    Glaucoma    Low back pain    MI (myocardial infarction) ([place])    Anterior 2006   Mixed hyperlipidemia    Renal insufficiency    Type 2 diabetes mellitus ([place])       Past Surgical History:  Procedure Laterality Date   CATARACT EXTRACTION, BILATERAL     CORONARY ANGIOPLASTY WITH STENT PLACEMENT  2006    Chief Complaint  Patient presents with   Hypotension      HPI:    Edward [name]  is a 71 y.o. male with past medical history relevant for moderate thrombocytopenia, macrocytic anemia with superimposed iron deficiency, copper deficiency, CKD 3B, HLD, CAD, DM2 , liver cirrhosis and gallstones and HTN who presents to the ED with worsening fatigue, weakness, myalgias and malaise and increasing lower extremity and scrotal edema -Patient was discharged on 11/24/2019 after IV fluids for rhabdomyolysis -Additional history obtained from patient's ex wife and sister-in-law at bedside Pt was sent over from [facility] due to increasing edema and soft blood pressure -Patient endorses dyspnea at rest and dyspnea on exertion but no frank chest pains, no leg pains or pleuritic symptoms -Troponin is 11 -UA is not suggestive of UTI -Lactic acid is 1.9 -Sodium 131, glucose 47 -Lipase is 151 patient has chronically elevated lipase -Total CK down to 367 -Creatinine is up to 2.64 creatinine was 1.58 on 11/23/2021 -LFTs continue to trend down since discharge, INR 1.4 which is close to patient's baseline -WBC 8.4 hemoglobin 11.6 which is close to prior baseline platelets 97 which is higher than  prior baseline -Chest x-ray with possible pneumonia no CHF type findings  Review of systems:    In addition to the HPI above,   A full Review of  Systems was done, all other systems reviewed are negative except as noted above in HPI , .    Social History:  Reviewed by me    Social History   Tobacco Use   Smoking status: Former    Packs/day: 2.00    Years: 20.00    Total pack years: 40.00    Types: Cigarettes    Quit date: 05/08/1987    Years since quitting: 34.5   Smokeless tobacco: Never  Substance Use Topics   Alcohol use: No    Alcohol/week: 0.0 standard drinks of alcohol     Family History :  Reviewed by me    Family History  Problem Relation Age of Onset   Aneurysm Father        Brain aneurysm   Lung cancer Mother        Small cell carcinoma of lung,kidney and heart   Heart disease Mother    Cancer Brother        lungs   Diabetes Sister    Diabetes Sister    Diabetes Brother    Diabetes Brother      Home Medications:   Prior to Admission medications   Medication Sig Start Date End Date Taking? Authorizing Provider  acetaminophen (TYLENOL) 500 MG tablet Take 500 mg by mouth every 6 (six) hours as needed.   Yes [provider]  aspirin 81 MG tablet Take 81 mg by mouth daily.   Yes [provider]  brimonidine (ALPHAGAN) 0.2 % ophthalmic solution Place 1 drop into both eyes 2 (two) times daily. 03/14/20  Yes [provider]  Cholecalciferol (VITAMIN D3) 2000 UNITS TABS Take 1 tablet by mouth daily.   Yes [provider]  Cyanocobalamin (B-12 PO) Take by mouth.   Yes [provider]  feeding supplement (ENSURE ENLIVE / ENSURE PLUS) LIQD Take 237 mLs by mouth 2 (two) times daily between meals. 11/22/21  Yes Barton Dubois, MD  glipiZIDE (GLUCOTROL) 5 MG tablet 2 tablets in morning and 1 tablet at night Patient taking differently: Take 5 mg by mouth See admin instructions. 2 tablets in morning and 1 tablet at evening  08/15/21  Yes Martin, Mary-Margaret, FNP  isosorbide mononitrate (IMDUR) 30 MG 24 hr tablet Take 1 tablet (30 mg total) by mouth daily. New 01/30/2018 08/15/21 11/27/21 Yes Martin, Mary-Margaret, FNP  lactulose (CHRONULAC) 10 GM/15ML solution Take 7.5 mLs (5 g total) by mouth daily. 11/23/21  Yes Barton Dubois, MD  metoprolol tartrate (LOPRESSOR) 25 MG tablet Take 0.5 tablets (12.5 mg total) by mouth in the morning and at bedtime. 08/15/21 08/15/22 Yes Martin, Mary-Margaret, FNP  nitroGLYCERIN (NITROSTAT) 0.4 MG SL tablet Place 1 tablet (0.4 mg total) under the tongue every 5 (five) minutes x 3 doses as needed for chest pain (if no relief after 2nd dose, proceed to the ED for an evaluation). 10/06/20  Yes Satira Sark, MD  Omega-3 Fatty Acids (FISH OIL) 1000 MG CAPS Take by mouth.   Yes [provider]  pantoprazole (PROTONIX) 40 MG tablet Take 1 tablet (40 mg total) by mouth daily. 08/15/21  Yes Martin, Mary-Margaret, FNP  sodium bicarbonate 650 MG tablet Take 1 tablet (650 mg total) by mouth 3 (three) times daily. 11/22/21  Yes Barton Dubois, MD  traZODone (DESYREL) 100 MG tablet Take 1 tablet (100 mg total) by mouth at bedtime. 08/15/21  Yes Hassell Done, Mary-Margaret, FNP  ACCU-CHEK AVIVA PLUS test strip USE TO CHECK BLOOD SUGAR ONCE DAILY 11/12/17   Hassell Done Mary-Margaret, FNP     Allergies:    No Known Allergies   Physical Exam:   Vitals  Blood pressure 115/60, pulse 77, temperature 98 F (36.7 C), temperature source Oral, resp. rate 12, height 5' 10"  (1.778 m), weight 85.7 kg, SpO2 99 %.  Physical Examination: General appearance - alert,  in no distress, speaking in complete sentences Mental status - alert, oriented to person, place, and time,  Eyes - sclera anicteric Neck - supple, no JVD elevation , Chest -respirations, no wheezing  heart - S1 and S2 normal, regular  Abdomen - soft, nontender, nondistended, +BS Neurological - screening mental status exam normal, neck supple  without  rigidity, cranial nerves II through XII intact, DTR's normal and symmetric Extremities -+3 pedal edema noted, intact peripheral pulses  Skin - warm, dry GU--scrotal  and penile edema    Data Review:    CBC Recent Labs  Lab 11/21/21 0415 11/22/21 0610 11/23/21 2203 11/27/21 1500  WBC 8.6 7.8 8.8 8.4  HGB 11.8* 11.7* 12.4* 11.6*  HCT 36.2* 35.9* 37.0* 34.0*  PLT 78* 80* 93* 97*  MCV 100.6* 99.7 99.5 98.6  MCH 32.8 32.5 33.3 33.6  MCHC 32.6 32.6 33.5 34.1  RDW 14.7 14.7 15.1 16.1*  LYMPHSABS 1.7  --   --  1.5  MONOABS 0.8  --   --  0.7  EOSABS 0.3  --   --  0.1  BASOSABS 0.1  --   --  0.0   ------------------------------------------------------------------------------------------------------------------  Chemistries  Recent Labs  Lab 11/21/21 0415 11/22/21 0610 11/23/21 2203 11/27/21 1500  NA 136 138 133* 131*  K 3.6 4.2 4.1 3.8  CL 106 109 103 99  CO2 23 23 22  21*  GLUCOSE 179* 126* 134* 47*  BUN 32* 30* 36* 47*  CREATININE 1.50* 1.27* 1.58* 2.64*  CALCIUM 8.2* 8.5* 8.7* 8.4*  MG  --   --   --  2.5*  AST 244* 176* 144* 113*  ALT 148* 137* 133* 101*  ALKPHOS 53 52 52 60  BILITOT 2.1* 2.4* 2.8* 2.2*   ------------------------------------------------------------------------------------------------------------------ estimated creatinine clearance is 27.3 mL/min (A) (by C-G formula based on SCr of 2.64 mg/dL (H)). ------------------------------------------------------------------------------------------------------------------ No results for input(s): "TSH", "T4TOTAL", "T3FREE", "THYROIDAB" in the last 72 hours.  Invalid input(s): "FREET3"   Coagulation profile Recent Labs  Lab 11/21/21 0415 11/22/21 0610 11/23/21 2203 11/27/21 1500  INR 1.5* 1.5* 1.4* 1.4*   ------------------------------------------------------------------------------------------------------------------- No results for input(s): "DDIMER" in the last 72  hours. -------------------------------------------------------------------------------------------------------------------  Cardiac Enzymes No results for input(s): "CKMB", "TROPONINI", "MYOGLOBIN" in the last 168 hours.  Invalid input(s): "CK" ------------------------------------------------------------------------------------------------------------------ No results found for: "BNP"   ---------------------------------------------------------------------------------------------------------------  Urinalysis    Component Value Date/Time   COLORURINE YELLOW 11/27/2021 1815   APPEARANCEUR HAZY (A) 11/27/2021 1815   LABSPEC 1.016 11/27/2021 1815   PHURINE 5.0 11/27/2021 1815   GLUCOSEU NEGATIVE 11/27/2021 1815   HGBUR NEGATIVE 11/27/2021 1815   BILIRUBINUR NEGATIVE 11/27/2021 1815   KETONESUR NEGATIVE 11/27/2021 1815   PROTEINUR NEGATIVE 11/27/2021 1815   NITRITE NEGATIVE 11/27/2021 1815   LEUKOCYTESUR NEGATIVE 11/27/2021 1815    ----------------------------------------------------------------------------------------------------------------   Imaging Results:    DG Chest Port 1 View  Result Date: 11/27/2021 CLINICAL DATA:  Shortness of breath EXAM: PORTABLE CHEST 1 VIEW COMPARISON:  11/21/2021 FINDINGS: Cardiac size is within normal limits. There are no signs of pulmonary edema. Linear densities are seen in both lower lung fields with slight improvement. No new focal infiltrates are seen. There is no pleural effusion or pneumothorax. IMPRESSION: Linear densities are seen in the lower lung fields suggesting atelectasis/pneumonia. There is interval improvement in the aeration of both lower lung fields. There are no new infiltrates or signs of pulmonary edema. Electronically Signed   By: Elmer Picker M.D.   On: 11/27/2021 15:57    Radiological Exams on Admission: DG Chest Port 1 View  Result Date: 11/27/2021 CLINICAL DATA:  Shortness of breath EXAM: PORTABLE CHEST 1 VIEW  COMPARISON:  11/21/2021 FINDINGS: Cardiac size is within normal limits. There are no signs of pulmonary edema. Linear densities are seen in both lower lung fields with slight improvement. No new focal infiltrates  are seen. There is no pleural effusion or pneumothorax. IMPRESSION: Linear densities are seen in the lower lung fields suggesting atelectasis/pneumonia. There is interval improvement in the aeration of both lower lung fields. There are no new infiltrates or signs of pulmonary edema. Electronically Signed   By: Elmer Picker M.D.   On: 11/27/2021 15:57    DVT Prophylaxis -SCD/Heparin AM Labs Ordered, also please review Full Orders  Family Communication: Admission, patients condition and plan of care including tests being ordered have been discussed with the patient and his wife and sister-in-law at bedside who indicate understanding and agree with the plan   Condition -stable   Roxan Hockey M.D on 11/27/2021 at 11:15 PM Go to [email or web address] -  for contact info  [facility] - Office  [phone]

## 2021-11-27 NOTE — ED Notes (Signed)
Provided pt with additional graham crackers and applesauce.

## 2021-11-27 NOTE — ED Notes (Signed)
Family requested a sugar check- CBG 51. Pt provided apple juice, peanut butter crackers, ice cream.

## 2021-11-27 NOTE — ED Triage Notes (Signed)
Pt seen for hypotension and fluid overload per family, sent by [facility] for evaluation.

## 2021-11-28 ENCOUNTER — Inpatient Hospital Stay (HOSPITAL_COMMUNITY): Payer: 59

## 2021-11-28 ENCOUNTER — Inpatient Hospital Stay: Payer: Self-pay

## 2021-11-28 ENCOUNTER — Encounter (HOSPITAL_COMMUNITY): Payer: Self-pay | Admitting: Family Medicine

## 2021-11-28 DIAGNOSIS — E162 Hypoglycemia, unspecified: Secondary | ICD-10-CM

## 2021-11-28 DIAGNOSIS — R188 Other ascites: Secondary | ICD-10-CM | POA: Diagnosis not present

## 2021-11-28 DIAGNOSIS — K7460 Unspecified cirrhosis of liver: Secondary | ICD-10-CM | POA: Diagnosis not present

## 2021-11-28 DIAGNOSIS — E8809 Other disorders of plasma-protein metabolism, not elsewhere classified: Secondary | ICD-10-CM | POA: Diagnosis not present

## 2021-11-28 DIAGNOSIS — R601 Generalized edema: Secondary | ICD-10-CM | POA: Diagnosis not present

## 2021-11-28 DIAGNOSIS — J189 Pneumonia, unspecified organism: Secondary | ICD-10-CM | POA: Diagnosis not present

## 2021-11-28 LAB — BODY FLUID CELL COUNT WITH DIFFERENTIAL
Eos, Fluid: 0 %
Lymphs, Fluid: 36 %
Monocyte-Macrophage-Serous Fluid: 54 % (ref 50–90)
Neutrophil Count, Fluid: 10 % (ref 0–25)
Total Nucleated Cell Count, Fluid: 41 cu mm (ref 0–1000)

## 2021-11-28 LAB — GLUCOSE, CAPILLARY
Glucose-Capillary: 107 mg/dL — ABNORMAL HIGH (ref 70–99)
Glucose-Capillary: 38 mg/dL — CL (ref 70–99)
Glucose-Capillary: 62 mg/dL — ABNORMAL LOW (ref 70–99)
Glucose-Capillary: 68 mg/dL — ABNORMAL LOW (ref 70–99)
Glucose-Capillary: 74 mg/dL (ref 70–99)
Glucose-Capillary: 75 mg/dL (ref 70–99)
Glucose-Capillary: 83 mg/dL (ref 70–99)
Glucose-Capillary: 95 mg/dL (ref 70–99)

## 2021-11-28 LAB — BASIC METABOLIC PANEL
Anion gap: 9 (ref 5–15)
BUN: 48 mg/dL — ABNORMAL HIGH (ref 8–23)
CO2: 20 mmol/L — ABNORMAL LOW (ref 22–32)
Calcium: 7.8 mg/dL — ABNORMAL LOW (ref 8.9–10.3)
Chloride: 102 mmol/L (ref 98–111)
Creatinine, Ser: 2.89 mg/dL — ABNORMAL HIGH (ref 0.61–1.24)
GFR, Estimated: 23 mL/min — ABNORMAL LOW (ref >60)
Glucose, Bld: 55 mg/dL — ABNORMAL LOW (ref 70–99)
Potassium: 3.6 mmol/L (ref 3.5–5.1)
Sodium: 131 mmol/L — ABNORMAL LOW (ref 135–145)

## 2021-11-28 LAB — CBC
HCT: 28.2 % — ABNORMAL LOW (ref 39.0–52.0)
Hemoglobin: 9.8 g/dL — ABNORMAL LOW (ref 13.0–17.0)
MCH: 34.3 pg — ABNORMAL HIGH (ref 26.0–34.0)
MCHC: 34.8 g/dL (ref 30.0–36.0)
MCV: 98.6 fL (ref 80.0–100.0)
Platelets: 63 10*3/uL — ABNORMAL LOW (ref 150–400)
RBC: 2.86 MIL/uL — ABNORMAL LOW (ref 4.22–5.81)
RDW: 15.9 % — ABNORMAL HIGH (ref 11.5–15.5)
WBC: 5.6 10*3/uL (ref 4.0–10.5)
nRBC: 0 % (ref 0.0–0.2)

## 2021-11-28 LAB — GRAM STAIN

## 2021-11-28 LAB — HEPATIC FUNCTION PANEL
ALT: 84 U/L — ABNORMAL HIGH (ref 0–44)
AST: 91 U/L — ABNORMAL HIGH (ref 15–41)
Albumin: 2.1 g/dL — ABNORMAL LOW (ref 3.5–5.0)
Alkaline Phosphatase: 53 U/L (ref 38–126)
Bilirubin, Direct: 0.6 mg/dL — ABNORMAL HIGH (ref 0.0–0.2)
Indirect Bilirubin: 1.3 mg/dL — ABNORMAL HIGH (ref 0.3–0.9)
Total Bilirubin: 1.9 mg/dL — ABNORMAL HIGH (ref 0.3–1.2)
Total Protein: 4.9 g/dL — ABNORMAL LOW (ref 6.5–8.1)

## 2021-11-28 LAB — PROTIME-INR
INR: 1.5 — ABNORMAL HIGH (ref 0.8–1.2)
Prothrombin Time: 17.9 seconds — ABNORMAL HIGH (ref 11.4–15.2)

## 2021-11-28 LAB — ALBUMIN, PLEURAL OR PERITONEAL FLUID: Albumin, Fluid: <1.5 g/dL

## 2021-11-28 MED ORDER — SODIUM CHLORIDE 0.9 % IV BOLUS
500.0000 mL | Freq: Once | INTRAVENOUS | Status: AC
  Administered 2021-11-28: 500 mL via INTRAVENOUS

## 2021-11-28 MED ORDER — ALBUMIN HUMAN 25 % IV SOLN
50.0000 g | Freq: Three times a day (TID) | INTRAVENOUS | Status: DC
  Administered 2021-11-28 (×2): 50 g via INTRAVENOUS
  Filled 2021-11-28 (×3): qty 200

## 2021-11-28 MED ORDER — MIDODRINE HCL 5 MG PO TABS
5.0000 mg | ORAL_TABLET | Freq: Three times a day (TID) | ORAL | Status: DC
Start: 2021-11-28 — End: 2021-11-29
  Administered 2021-11-28 – 2021-11-29 (×4): 5 mg via ORAL
  Filled 2021-11-28 (×4): qty 1

## 2021-11-28 MED ORDER — CHLORHEXIDINE GLUCONATE CLOTH 2 % EX PADS
6.0000 | MEDICATED_PAD | Freq: Every day | CUTANEOUS | Status: DC
Start: 2021-11-28 — End: 2021-11-30
  Administered 2021-11-28 – 2021-11-29 (×2): 6 via TOPICAL

## 2021-11-28 MED ORDER — DEXTROSE 10 % IV SOLN: INTRAVENOUS | Status: DC

## 2021-11-28 MED ORDER — DEXTROSE 5 % IV SOLN
INTRAVENOUS | Status: DC
Start: 2021-11-28 — End: 2021-11-28

## 2021-11-28 NOTE — Progress Notes (Signed)
Patient CBG 38, Hypoglycemic protocol initiated, charge nurse and Dr. Josph Macho notified. See new orders in chart.

## 2021-11-28 NOTE — Progress Notes (Signed)
PT tolerated left sided paracentesis procedure well today and 3.3 Liters of clear yellow ascites removed and taken to lab for processing. PT returned back to inpatient bed assignment at this time with no acute distress noted.

## 2021-11-28 NOTE — Progress Notes (Signed)
PROGRESS NOTE   Edward [name] [facility]  ZOX:[id number] DOB: 1950-06-12 DOA: 11/27/2021 PCP: [name], FNP   Chief Complaint  Patient presents with   Hypotension   Level of care: Telemetry  Brief Admission History:   71 y.o. male with past medical history relevant for moderate thrombocytopenia, macrocytic anemia with superimposed iron deficiency, copper deficiency, CKD 3B, HLD, CAD, DM2 , liver cirrhosis and gallstones and HTN who presents to the ED with worsening fatigue, weakness, myalgias and malaise and increasing lower extremity and scrotal edema -Patient was discharged on 11/24/2019 after IV fluids for rhabdomyolysis -Additional history obtained from patient's ex wife and sister-in-law at bedside Pt was sent over from PCPs clinic due to increasing edema and soft blood pressure -Patient endorses dyspnea at rest and dyspnea on exertion but no frank chest pains, no leg pains or pleuritic symptoms -Troponin is 11 -UA is not suggestive of UTI -Lactic acid is 1.9 -Sodium 131, glucose 47 -Lipase is 151 patient has chronically elevated lipase -Total CK down to 367 -Creatinine is up to 2.64 creatinine was 1.58 on 11/23/2021 -LFTs continue to trend down since discharge, INR 1.4 which is close to patient's baseline -WBC 8.4 hemoglobin 11.6 which is close to prior baseline platelets 97 which is higher than prior baseline -Chest x-ray with possible pneumonia no CHF type findings   Assessment and Plan:  1)Volume Overload/Anasarca--- secondary to decompensated liver cirrhosis -Now presenting with significant lower extremity edema including scrotal and penile edema -Blood pressure is soft and started on midodrine 5 mg TID -lasix on hold by nephrology team for now   2)AKI--acute kidney injury on CKD stage -3A    --Creatinine bumped on IV lasix which is being held now - consulted with nephrologist - see recommendations - renally adjust medications, avoid nephrotoxic agents / dehydration   / hypotension - patient follows with Dr. Theador Hawthorne on outpatient basis    3)Liver Cirrhosis with Ascites and Portal Hypertension---?Etiology -No significant alcohol use -LFTs being trended -Patient was previously on atorvastatin and copper supplements -Viral hepatitis profile was negative (negative for hep C and hep B may have had hep A infection in the past with antibodies noted) --Ultrasound from 12/14/2021 reveals not enough fluid for paracentesis -Patient work-up by GI service included ANA, mitochondrial antibodies, anti-smooth antibodies, IgG IgA and IgM - -GI consulted to help determine if patient would benefit from EGD and liver biopsy     4)-cholelithiasis - CT abdomen and pelvis and right upper quadrant ultrasound consistent with gallstones and cholecystitis -Recent surgical consult appreciated -Recent GI consult appreciated HIDA scan with cystic duct patency noted- -May need liver biopsy as outpatient -EGD as outpatient to rule out varices    5)History of copper deficiency--- has been on copper replacement for over 1 year -Random Urine Copper high at 72 (09/12/21) -Hold copper tablets as copper toxicity can lead to hepatic dysfunction -Discussed with patient's hematologist Dr. Delton Coombes--- liver biopsy will be able to tell if patient has copper toxicity or not   6)Anemia and Thrombocytopenia--- with macrocytosis --diagnosed in May 2021 -- SPEP in June 2021 was normal.  Erythropoietin normal at 11.9. -Previously declined bone marrow biopsy for evaluation of possible early MDS -Previously treated with copper supplements -Prior work-up revealed somewhat low iron --- B12 and Folate were Not low -hemoglobin 11.6 which is close to prior baseline platelets 97 which is higher than prior baseline   7)8)DM2---  A1c- 6.7 on 11/14/2021.[name]  Reflecting good diabetic control PTA -Patient having episodes of severe  hypoglycemia, started d10 infusion Use Novolog/Humalog Sliding scale insulin  with Accu-Cheks/Fingersticks as ordered   CBG (last 3)  Recent Labs    11/28/21 0404 11/28/21 0759 11/28/21 1211  GLUCAP 62* 107* 83    8)-Esophagitis--- recent CT chest shows -Patulous and thickened esophagus. -Protonix as ordered EGD as outpatient to be done as noted above #1   9)Social/ethics--- Discussed with patient's ex-wife and and sister-in-law at bedside -Patient is a full code   10)Recurrent severe Hypoglycemia----worsened by sulfonylurea use, impaired gluconeogenesis and impaired glucagon due to decompensated liver disease, due to poor oral intake in setting of underlying liver cirrhosis, I ordered d10 infusion at low rate of 30 cc/hour and hopefully we can get him off of this soon.  DC all insulin.  -feed liberally and frequently   CBG (last 3)  Recent Labs    11/28/21 0404 11/28/21 0759 11/28/21 1211  GLUCAP 62* 107* 83    11)CAP_-patient with some shortness of breath, chest x-ray suggestive of pneumonia treat empirically with ceftriaxone/azithromycin along with bronchodilators and mucolytic   Disposition/Need for in-Hospital Stay- patient unable to be discharged at this time due to -anasarca/volume overload, decompensated liver cirrhosis    DVT prophylaxis: SCDs Code Status: Full  Family Communication:  Disposition: Status is: Inpatient Remains inpatient appropriate because: intensity    Consultants:  GI Nephrology   Procedures:  Paracentesis 7/25 ~3.4L ascitic fluid removed Antimicrobials:  Ceftriaxone/azithromycin   Subjective: Pt reports that he is urinating frequently on IV lasix.  Objective: Vitals:   11/28/21 1350 11/28/21 1400 11/28/21 1410 11/28/21 1433  BP: 110/62 (!) 103/58 91/60 (!) 97/59  Pulse: 65 61 63 63  Resp: 18 18 18 18   Temp:    97.6 F (36.4 C)  TempSrc:    Oral  SpO2: 95% 95% 95% 99%  Weight:      Height:        Intake/Output Summary (Last 24 hours) at 11/28/2021 1628 Last data filed at 11/28/2021 0530 Gross per 24 hour   Intake 960.89 ml  Output --  Net 960.89 ml   Filed Weights   11/27/21 1412 11/28/21 0412  Weight: 85.7 kg 86.7 kg   Examination:  General exam: chronically ill and very volume overloaded, appears anasarcic, Appears calm and comfortable  Respiratory system: Clear to auscultation. Respiratory effort normal. Cardiovascular system: normal S1 & S2 heard. No JVD, murmurs, rubs, gallops or clicks. 2++ pitting edema BLEs. Gastrointestinal system: Abdomen is distended, soft and mildly tender. No organomegaly or masses felt. Normal bowel sounds heard. Central nervous system: Alert and oriented. No focal neurological deficits. Extremities: Symmetric 5 x 5 power. Skin: No rashes, lesions or ulcers. Psychiatry: Judgement and insight appear normal. Mood & affect appropriate.   Data Reviewed: I have personally reviewed following labs and imaging studies  CBC: Recent Labs  Lab 11/22/21 0610 11/23/21 2203 11/27/21 1500 11/28/21 1153  WBC 7.8 8.8 8.4 5.6  NEUTROABS  --   --  6.0  --   HGB 11.7* 12.4* 11.6* 9.8*  HCT 35.9* 37.0* 34.0* 28.2*  MCV 99.7 99.5 98.6 98.6  PLT 80* 93* 97* 63*    Basic Metabolic Panel: Recent Labs  Lab 11/22/21 0610 11/23/21 2203 11/27/21 1500 11/28/21 0447  NA 138 133* 131* 131*  K 4.2 4.1 3.8 3.6  CL 109 103 99 102  CO2 23 22 21* 20*  GLUCOSE 126* 134* 47* 55*  BUN 30* 36* 47* 48*  CREATININE 1.27* 1.58* 2.64* 2.89*  CALCIUM 8.5* 8.7*  8.4* 7.8*  MG  --   --  2.5*  --     CBG: Recent Labs  Lab 11/28/21 0149 11/28/21 0211 11/28/21 0404 11/28/21 0759 11/28/21 1211  GLUCAP 38* 75 62* 107* 83    Recent Results (from the past 240 hour(s))  Gram stain     Status: None   Collection Time: 11/28/21  1:50 PM   Specimen: Peritoneal Cavity  Result Value Ref Range Status   Specimen Description PERITONEAL CAVITY  Final   Special Requests PERITONEAL CAVITY  Final   Gram Stain   Final    WBC PRESENT, PREDOMINANTLY MONONUCLEAR NO ORGANISMS  SEEN CYTOSPIN SMEAR Performed at [facility], [address] [employer], [place] [address]    Report Status 11/28/2021 FINAL  Final  Culture, body fluid w Gram Stain-bottle     Status: None (Preliminary result)   Collection Time: 11/28/21  1:50 PM   Specimen: Peritoneal Cavity  Result Value Ref Range Status   Specimen Description PERITONEAL CAVITY  Final   Special Requests   Final    BOTTLES DRAWN AEROBIC AND ANAEROBIC Blood Culture adequate volume Performed at [facility], [address], [place], [place] [address]    Culture PENDING  Incomplete   Report Status PENDING  Incomplete     Radiology Studies: US Paracentesis  Result Date: 11/28/2021 Lavonia Dana, MD     11/28/2021  2:25 PM PreOperative Dx: Ascites Postoperative Dx: Ascites Procedure:   US guided paracentesis Radiologist:  Thornton Papas Anesthesia:  10 ml of1% lidocaine Specimen:  3.4 L of yellow ascitic fluid EBL:   < 1 ml Complications: None    [place] EKG SITE RITE  Result Date: 11/28/2021 If Site Rite image not attached, placement could not be confirmed due to current cardiac rhythm.  DG Chest Port 1 View  Result Date: 11/27/2021 CLINICAL DATA:  Shortness of breath EXAM: PORTABLE CHEST 1 VIEW COMPARISON:  11/21/2021 FINDINGS: Cardiac size is within normal limits. There are no signs of pulmonary edema. Linear densities are seen in both lower lung fields with slight improvement. No new focal infiltrates are seen. There is no pleural effusion or pneumothorax. IMPRESSION: Linear densities are seen in the lower lung fields suggesting atelectasis/pneumonia. There is interval improvement in the aeration of both lower lung fields. There are no new infiltrates or signs of pulmonary edema. Electronically Signed   By: Elmer Picker M.D.   On: 11/27/2021 15:57    Scheduled Meds:  aspirin  81 mg Oral Daily   brimonidine  1 drop Both Eyes BID   Chlorhexidine Gluconate Cloth  6 each Topical Daily   dextromethorphan-guaiFENesin  1 tablet Oral  BID   heparin  5,000 Units Subcutaneous Q8H   lactulose  5 g Oral Daily   midodrine  5 mg Oral TID WC   pantoprazole  40 mg Oral Daily   sodium chloride flush  3 mL Intravenous Q12H   sodium chloride flush  3 mL Intravenous Q12H   traZODone  100 mg Oral QHS   Continuous Infusions:  sodium chloride     albumin human     azithromycin 500 mg (11/28/21 0130)   cefTRIAXone (ROCEPHIN)  IV 1 g (11/28/21 0043)   dextrose 30 mL/hr at 11/28/21 0932    LOS: 1 day   Time spent: 21 mins  Zacaria Pousson Wynetta Emery, MD How to contact the [facility] Attending or Consulting provider [place] or covering provider during after hours [place], for this patient?  Check the  care team in [facility] and look for a) attending/consulting [place] provider listed and b) the [facility] team listed Log into [email or web address] and use [place]'s universal password to access. If you do not have the password, please contact the hospital operator. Locate the [facility] provider you are looking for under [facility] and page to a number that you can be directly reached. If you still have difficulty reaching the provider, please page the [facility] (Director on Call) for the Hospitalists listed on amion for assistance.  11/28/2021, 4:28 PM

## 2021-11-28 NOTE — Progress Notes (Signed)
Patient manual bp 86/36, nurse made Dr. Earnest Conroy aware. New orders for bolus. Bp recheck 97/48 after bolus.

## 2021-11-28 NOTE — Hospital Course (Signed)
71 y.o. male with past medical history relevant for moderate thrombocytopenia, macrocytic anemia with superimposed iron deficiency, copper deficiency, CKD 3B, HLD, CAD, DM2 , liver cirrhosis and gallstones and HTN who presents to the ED with worsening fatigue, weakness, myalgias and malaise and increasing lower extremity and scrotal edema -Patient was discharged on 11/24/2019 after IV fluids for rhabdomyolysis -Additional history obtained from patient's ex wife and sister-in-law at bedside Pt was sent over from PCPs clinic due to increasing edema and soft blood pressure -Patient endorses dyspnea at rest and dyspnea on exertion but no frank chest pains, no leg pains or pleuritic symptoms -Troponin is 11 -UA is not suggestive of UTI -Lactic acid is 1.9 -Sodium 131, glucose 47 -Lipase is 151 patient has chronically elevated lipase -Total CK down to 367 -Creatinine is up to 2.64 creatinine was 1.58 on 11/23/2021 -LFTs continue to trend down since discharge, INR 1.4 which is close to patient's baseline -WBC 8.4 hemoglobin 11.6 which is close to prior baseline platelets 97 which is higher than prior baseline -Chest x-ray with possible pneumonia no CHF type findings

## 2021-11-28 NOTE — Consult Note (Signed)
$'@LOGO'B$ @   Referring Provider: [facility] Primary Care Physician:  [name], FNP Primary Gastroenterologist:  Dr. Abbey Chatters  Date of Admission:  Date of Consultation:   Reason for Consultation:  Decompensated cirrhosis   HPI:  Edward [name] is a 71 y.o. year old male  with past medical history significant for moderate thrombocytopenia, macrocytic anemia, copper deficiency following with hematology, CKD, HLD, CAD, DM 2, HTN, cirrhosis, gallstones, who presented to the emergency room with worsening fatigue, weakness, myalgias, malaise, increasing lower extremity edema and abdominal distention.  Recent admission 7/15 - 7/19 with progressive weakness, elevated CK, new diagnosis of cirrhosis, elevated LFTs, and possible calculus cholecystitis.  Also his liver enzymes may be elevated secondary to acute rhabdomyolysis.  LFTs seem to be trending down during admission.  HIDA scan during admission with patent CBD and cystic duct, no acute cholecystitis, but demonstrated biliary dyskinesia/chronic cholecystitis.  No clinical symptoms to suggest cholecystitis.  His work-up inpatient showed viral hepatitis panel negative, AIH serologies negative.  Crestor was placed on hold.  Wilsons disease has been in question outpatient due to low ceruloplasmin and elevated 24-hour urine copper (while on copper supplementation), and recommended considering ophthalmology evaluation to rule out Kayser-Fleischer rings.  May ultimately need liver biopsy.  His cirrhosis was complicated by the presence of ascites.  Attempted paracentesis, but not enough fluid for this.  Recommended starting diuretic therapy once rhabdo resolved. We also recommended outpatient EGD for esophageal wall thickening noted on CT and variceal screening.  Notably, on day of discharge, CK on discharge was 2,192.   He presented to the emergency room on 11/23/2021 due to worsening abdominal distention and discomfort.  Laboratory evaluation was  stable.  CT with mild low-density wall thickening of the cecum and ascending colon possibly due to portal colopathy, cirrhosis with portal hypertension with splenomegaly, paraesophageal and perigastric varices, moderate volume ascites has increased, generalized body wall edema and increased, small pleural effusion and adjacent atelectasis, new from prior exam, trace right pleural effusion.  He was offered admission, but preferred to go home and follow-up with primary doctor.  He was seen by PCP on 7/24 and noted increased abdominal distention, lower extremity edema up to his pelvic region, and felt fatigued/weak.  No appetite due to stomach feeling so full and only had about 20 fluid ounces in the last 24 hours.  Also reported shortness of breath.  He was sent to the ER for further evaluation.  ED course: Hypotensive with BP 91/53.  Had few episodes of tachypnea.  Otherwise, vital signs stable. Labs show WBC normal, hemoglobin stable at 11.6, platelets stable at 97.  LFTs elevated but trending down with AST 113, ALT 101, total bilirubin 2.2.  INR 1.4. Creatinine up to 2.64. Lipase 151. CK 367. Lactic acid 1.9. Troponins flat.  Chest x-ray with linear densities in the lower lung field suggesting atelectasis/pneumonia, interval improvement in aeration of both lower lung fields, no new infiltrates or signs of pulmonary edema.  He was started on IV Lasix drip to gently diurese. Also started on azithromycin and Rocephin.  Creatinine this morning up to 2.89.  GI and nephrology both consulted.  Today: Reports he has had increasing abdominal distension and LE edema up to his hips including his scrotum since discharge. No abdominal pain, nausea, or vomiting. Having 4-5 Bms daily with taking Lactulose. No mental status changes. Just feeling tired, same since discharge. Prior weakness seemed to improve somewhat. States he could move around a little better. No fever  or chills. Some SOB. About the same since  hospital discharge. No CP.   No brbpr or melena. No heartburn or dysphagia.   No tylenol or ibuprofen.   Reports he has gained about 30 lbs of fluid.   He has had some episodes of hypotension today but BP is 86/36.  He received 500 ml IV fluid bolus with BP improved to 97/48.  Past Medical History:  Diagnosis Date   Cataract    CKD (chronic kidney disease) stage 3, GFR 30-59 ml/min (HCC)    Coronary atherosclerosis of native coronary artery    BMS LAD 2006; residual 75% distal RCA; EF 50%   Essential hypertension    Glaucoma    Low back pain    MI (myocardial infarction) ([place])    Anterior 2006   Mixed hyperlipidemia    Renal insufficiency    Type 2 diabetes mellitus ([place])     Past Surgical History:  Procedure Laterality Date   CATARACT EXTRACTION, BILATERAL     CORONARY ANGIOPLASTY WITH STENT PLACEMENT  2006    Prior to Admission medications   Medication Sig Start Date End Date Taking? Authorizing Provider  acetaminophen (TYLENOL) 500 MG tablet Take 500 mg by mouth every 6 (six) hours as needed.   Yes [provider]  aspirin 81 MG tablet Take 81 mg by mouth daily.   Yes [provider]  brimonidine (ALPHAGAN) 0.2 % ophthalmic solution Place 1 drop into both eyes 2 (two) times daily. 03/14/20  Yes [provider]  Cholecalciferol (VITAMIN D3) 2000 UNITS TABS Take 1 tablet by mouth daily.   Yes [provider]  Cyanocobalamin (B-12 PO) Take by mouth.   Yes [provider]  feeding supplement (ENSURE ENLIVE / ENSURE PLUS) LIQD Take 237 mLs by mouth 2 (two) times daily between meals. 11/22/21  Yes Barton Dubois, MD  glipiZIDE (GLUCOTROL) 5 MG tablet 2 tablets in morning and 1 tablet at night Patient taking differently: Take 5 mg by mouth See admin instructions. 2 tablets in morning and 1 tablet at evening 08/15/21  Yes Martin, Mary-Margaret, FNP  isosorbide mononitrate (IMDUR) 30 MG 24 hr tablet Take 1 tablet (30 mg total) by mouth  daily. New 01/30/2018 08/15/21 11/27/21 Yes Martin, Mary-Margaret, FNP  lactulose (CHRONULAC) 10 GM/15ML solution Take 7.5 mLs (5 g total) by mouth daily. 11/23/21  Yes Barton Dubois, MD  metoprolol tartrate (LOPRESSOR) 25 MG tablet Take 0.5 tablets (12.5 mg total) by mouth in the morning and at bedtime. 08/15/21 08/15/22 Yes Martin, Mary-Margaret, FNP  nitroGLYCERIN (NITROSTAT) 0.4 MG SL tablet Place 1 tablet (0.4 mg total) under the tongue every 5 (five) minutes x 3 doses as needed for chest pain (if no relief after 2nd dose, proceed to the ED for an evaluation). 10/06/20  Yes Satira Sark, MD  Omega-3 Fatty Acids (FISH OIL) 1000 MG CAPS Take by mouth.   Yes [provider]  pantoprazole (PROTONIX) 40 MG tablet Take 1 tablet (40 mg total) by mouth daily. 08/15/21  Yes Martin, Mary-Margaret, FNP  sodium bicarbonate 650 MG tablet Take 1 tablet (650 mg total) by mouth 3 (three) times daily. 11/22/21  Yes Barton Dubois, MD  traZODone (DESYREL) 100 MG tablet Take 1 tablet (100 mg total) by mouth at bedtime. 08/15/21  Yes Hassell Done, Mary-Margaret, FNP  ACCU-CHEK AVIVA PLUS test strip USE TO CHECK BLOOD SUGAR ONCE DAILY 11/12/17   [name], FNP    Current Facility-Administered Medications  Medication Dose Route Frequency Provider  Last Rate Last Admin   0.9 %  sodium chloride infusion   Intravenous PRN Emokpae, Courage, MD       acetaminophen (TYLENOL) tablet 650 mg  650 mg Oral Q6H PRN Emokpae, Courage, MD       Or   acetaminophen (TYLENOL) suppository 650 mg  650 mg Rectal Q6H PRN Emokpae, Courage, MD       albuterol (PROVENTIL) (2.5 MG/3ML) 0.083% nebulizer solution 2.5 mg  2.5 mg Nebulization Q2H PRN Emokpae, Courage, MD       aspirin chewable tablet 81 mg  81 mg Oral Daily Emokpae, Courage, MD   81 mg at 11/28/21 0853   azithromycin (ZITHROMAX) 500 mg in sodium chloride 0.9 % 250 mL IVPB  500 mg Intravenous Q24H Emokpae, Courage, MD 250 mL/hr at 11/28/21 0130 500 mg at 11/28/21 0130    bisacodyl (DULCOLAX) suppository 10 mg  10 mg Rectal Daily PRN Emokpae, Courage, MD       brimonidine (ALPHAGAN) 0.2 % ophthalmic solution 1 drop  1 drop Both Eyes BID Emokpae, Courage, MD   1 drop at 11/28/21 0854   cefTRIAXone (ROCEPHIN) 1 g in sodium chloride 0.9 % 100 mL IVPB  1 g Intravenous Q24H Emokpae, Courage, MD 200 mL/hr at 11/28/21 0043 1 g at 11/28/21 0043   dextromethorphan-guaiFENesin (MUCINEX DM) 30-600 MG per 12 hr tablet 1 tablet  1 tablet Oral BID Roxan Hockey, MD   1 tablet at 11/28/21 0853   dextrose 10 % infusion   Intravenous Continuous Wynetta Emery, Clanford L, MD 30 mL/hr at 11/28/21 0932 New Bag at 11/28/21 0932   furosemide (LASIX) 200 mg in dextrose 5 % 100 mL (2 mg/mL) infusion  4 mg/hr Intravenous Continuous Emokpae, Courage, MD 2 mL/hr at 11/27/21 2152 4 mg/hr at 11/27/21 2152   heparin injection 5,000 Units  5,000 Units Subcutaneous Q8H Emokpae, Courage, MD   5,000 Units at 11/28/21 0531   lactulose (CHRONULAC) 10 GM/15ML solution 5 g  5 g Oral Daily Emokpae, Courage, MD   5 g at 11/28/21 0853   midodrine (PROAMATINE) tablet 5 mg  5 mg Oral TID WC Johnson, Clanford L, MD   5 mg at 11/28/21 0929   ondansetron (ZOFRAN) tablet 4 mg  4 mg Oral Q6H PRN Emokpae, Courage, MD       Or   ondansetron (ZOFRAN) injection 4 mg  4 mg Intravenous Q6H PRN Emokpae, Courage, MD       pantoprazole (PROTONIX) EC tablet 40 mg  40 mg Oral Daily Emokpae, Courage, MD   40 mg at 11/28/21 0853   polyethylene glycol (MIRALAX / GLYCOLAX) packet 17 g  17 g Oral Daily PRN Emokpae, Courage, MD       sodium bicarbonate tablet 650 mg  650 mg Oral TID Denton Brick, Courage, MD   650 mg at 11/28/21 0854   sodium chloride flush (NS) 0.9 % injection 3 mL  3 mL Intravenous Q12H Emokpae, Courage, MD       sodium chloride flush (NS) 0.9 % injection 3 mL  3 mL Intravenous Q12H Emokpae, Courage, MD   3 mL at 11/27/21 2158   sodium chloride flush (NS) 0.9 % injection 3 mL  3 mL Intravenous PRN Emokpae, Courage,  MD       traZODone (DESYREL) tablet 100 mg  100 mg Oral QHS Emokpae, Courage, MD   100 mg at 11/27/21 2154   traZODone (DESYREL) tablet 50 mg  50 mg Oral QHS PRN Roxan Hockey, MD  Allergies as of 11/27/2021   (No Known Allergies)    Family History  Problem Relation Age of Onset   Aneurysm Father        Brain aneurysm   Lung cancer Mother        Small cell carcinoma of lung,kidney and heart   Heart disease Mother    Cancer Brother        lungs   Diabetes Sister    Diabetes Sister    Diabetes Brother    Diabetes Brother     Social History   Socioeconomic History   Marital status: Legally Separated    Spouse name: [name]    Number of children: 2   Years of education: 7th   Highest education level: 7th grade  Occupational History   Occupation: Retired  Tobacco Use   Smoking status: Former    Packs/day: 2.00    Years: 20.00    Total pack years: 40.00    Types: Cigarettes    Quit date: 05/08/1987    Years since quitting: 34.5   Smokeless tobacco: Never  Vaping Use   Vaping Use: Never used  Substance and Sexual Activity   Alcohol use: No    Alcohol/week: 0.0 standard drinks of alcohol   Drug use: No   Sexual activity: Not Currently  Other Topics Concern   Not on file  Social History Narrative   Lives alone    Social Determinants of Health   Financial Resource Strain: Low Risk  (07/11/2021)   Overall Financial Resource Strain (CARDIA)    Difficulty of Paying Living Expenses: Not very hard  Food Insecurity: No Food Insecurity (07/11/2021)   Hunger Vital Sign    Worried About Running Out of Food in the Last Year: Never true    [place] in the Last Year: Never true  Transportation Needs: No Transportation Needs (07/11/2021)   PRAPARE - Hydrologist (Medical): No    Lack of Transportation (Non-Medical): No  Physical Activity: Insufficiently Active (07/11/2021)   Exercise Vital Sign    Days of Exercise per Week: 7 days     Minutes of Exercise per Session: 20 min  Stress: No Stress Concern Present (07/11/2021)   [place]    Feeling of Stress : Not at all  Social Connections: Socially Isolated (07/11/2021)   Social Connection and Isolation Panel [NHANES]    Frequency of Communication with Friends and Family: More than three times a week    Frequency of Social Gatherings with Friends and Family: More than three times a week    Attends Religious Services: Never    Marine scientist or Organizations: No    Attends Archivist Meetings: Never    Marital Status: Separated  Intimate Partner Violence: Not At Risk (07/11/2021)   Humiliation, Afraid, Rape, and Kick questionnaire    Fear of Current or Ex-Partner: No    Emotionally Abused: No    Physically Abused: No    Sexually Abused: No    Review of Systems: Gen: Denies fever, chills, cold or flulike symptoms, pre-syncope, or syncope.  CV: Denies chest pain, heart palpitations. Resp: Admits to mild SOB.   GI: See HPI GU : Denies urinary burning, urinary frequency, urinary incontinence.  MS: Denies joint pain. Derm: Denies rash. Psych: Denies depression, anxiety,confusion. Heme: See HPI  Physical Exam: Vital signs in last 24 hours: Temp:  [97.1 F (36.2 C)-98.7 F (  37.1 C)] 97.6 F (36.4 C) (07/25 0408) Pulse Rate:  [66-85] 72 (07/25 0633) Resp:  [12-25] 17 (07/25 0408) BP: (86-134)/(36-70) 97/48 (07/25 0633) SpO2:  [94 %-99 %] 96 % (07/25 0408) Weight:  [85.4 kg-86.7 kg] 86.7 kg (07/25 0412) Last BM Date : 11/21/21 General:   Alert,  Well-developed, well-nourished, chronically ill appearing.  Head:  Normocephalic and atraumatic. Eyes:  Sclera clear, no icterus.   Conjunctiva pink. Ears:  Normal auditory acuity. Nose:  No deformity, discharge,  or lesions.\ Lungs:  Clear throughout to auscultation.   No wheezes, crackles, or rhonchi. No acute distress. Heart:  Regular rate  and rhythm; no murmurs, clicks, rubs,  or gallops. Abdomen:  Distended with tense ascites. No tenderness. Gause in place in RLQ that is blood soaked following injection this morning. Normal bowel sounds, without guarding, and without rebound.   Rectal:  Deferred  Msk:  Symmetrical without gross deformities. Normal posture. Pulses:  Normal pulses noted. Extremities:  With 3+ pitting edema up to hips. Popped blister on left foot about 5 cm in size with overlaying skin pealed back. Small blister on right anterior ankle with overlaying skin still intact.  Neurologic:  Alert and  oriented x4;  grossly normal neurologically. Skin:  Intact without significant lesions or rashes. Psych:   Normal mood and affect.  Intake/Output from previous day: 07/24 0701 - 07/25 0700 In: 960.9 [P.O.:237; I.V.:123.9; IV Piggyback:600] Out: -  Intake/Output this shift: No intake/output data recorded.  Lab Results: Recent Labs    11/27/21 1500  WBC 8.4  HGB 11.6*  HCT 34.0*  PLT 97*   BMET Recent Labs    11/27/21 1500 11/28/21 0447  NA 131* 131*  K 3.8 3.6  CL 99 102  CO2 21* 20*  GLUCOSE 47* 55*  BUN 47* 48*  CREATININE 2.64* 2.89*  CALCIUM 8.4* 7.8*   LFT Recent Labs    11/27/21 1500  PROT 5.8*  ALBUMIN 2.5*  AST 113*  ALT 101*  ALKPHOS 60  BILITOT 2.2*   PT/INR Recent Labs    11/27/21 1500  LABPROT 16.7*  INR 1.4*    Studies/Results: DG Chest Port 1 View  Result Date: 11/27/2021 CLINICAL DATA:  Shortness of breath EXAM: PORTABLE CHEST 1 VIEW COMPARISON:  11/21/2021 FINDINGS: Cardiac size is within normal limits. There are no signs of pulmonary edema. Linear densities are seen in both lower lung fields with slight improvement. No new focal infiltrates are seen. There is no pleural effusion or pneumothorax. IMPRESSION: Linear densities are seen in the lower lung fields suggesting atelectasis/pneumonia. There is interval improvement in the aeration of both lower lung fields. There  are no new infiltrates or signs of pulmonary edema. Electronically Signed   By: Elmer Picker M.D.   On: 11/27/2021 15:57    Impression: 71 y.o. year old male  with past medical history significant for moderate thrombocytopenia, macrocytic anemia, copper deficiency following with hematology, CKD, HLD, CAD, DM 2, HTN, cirrhosis complicated by ascites and portal hypertension with paraesophageal and perigastric varices per imaging, gallstones, recent admission and discharge on 7/19 with rhabdomyolysis and also found to have new diagnosis of cirrhosis as well as elevated LFTs with negative work-up while inpatient though Wilsons disease has been in question.  He presented to the emergency room 7/24 with worsening fatigue, weakness, increasing lower extremity edema and abdominal distention, found to have significant anasarca on exam, labs with AKI, chest x-ray with possible atelectasis/pneumonia.  He was admitted, GI and nephrology  consulted to assist with management.  Decompensated cirrhosis: MELD Na 26 on admission.  Complicated by ascites, portal hypertension with paraesophageal and perigastric varices per imaging, coagulopathy with thrombocytopenia/elevated INR.  Admitted with anasarca with 3+ pitting edema up to hips bilaterally, scrotal edema, and worsening ascites on exam, likely influenced by IV hydration provided during recent admission for rhabdomyolysis.  No HE, on lactulose due to mildly elevated ammonia during last admission, but no symptoms of hepatic encephalopathy in the past. Unfortunately, he also has AKI.  He was started on low-dose Lasix drip yesterday, but had worsening kidney function with creatinine up to 2.89 today.  He has also had some hypotension overnight that is improved with IV fluid bolus.  Case discussed with nephrology who is holding diuretics today in hopes that we can resume these in the next couple of days once kidney function has improved.  We will plan for paracentesis  today and will give albumin as well.   Regarding the etiology of his cirrhosis, it is unclear.  Wilson's disease has been in question due to low ceruloplasmin elevated 24-hour urine copper (on copper supplementation).  He will need slit-lamp with ophthalmology to rule out [employer] rings.  Additional prior work-up has been negative for AIH, viral hepatitis. Ferritin is not elevated. We have previously recommended considering outpatient liver biopsy if work-up is unrevealing. He will also need outpatient EGD for variceal screening.   Elevated LFTs:  Noted acute LFT elevation during last admission thought to be secondary to acute rhabdomyolysis.  LFTs continue to improve.  Work-up during last admission was negative for viral hepatitis, AIH.  Wilsons disease remains in question due to outpatient low ceruloplasmin elevated 24-hour urine copper (while on copper supplementation).  We had recommended ophthalmology evaluation to rule out [facility] rings and can consider liver biopsy if work-up is unrevealing.  We will continue to monitor LFTs this admission.  AKI:  Creatinine 2.64 on admission, up from 1.58 on 7/20.  Nephrology on board.  Felt to be multifactorial in the setting of ischemic and prerenal insults with hypotension as well as recent IV contrast exposure on 7/20 for CT scan. Lasix has been stopped for now.  Hopeful that we can resume diuretics once hypotension improves.  Anemia:  Chronic.  Baseline hemoglobin in the 11-12 range, following with hematology.  No evidence of IDA, B12, or folate deficiency noted in the chart.  Hemoglobin is stable.  No overt GI bleeding.  No overt GI bleeding.  Hemoglobin stable.  Last TCS 10 years ago with [facility], 2 polyps, no previous upper endoscopies.    Plan: CBC, HFP, INR today.  Paracentesis today with fluid analysis.  IV 25 % albumin 50 g TID today.  Diuretics per nephrologist. Currently on hold due to AKI.  Continue lactulose with goal of  2-3 bowel movements daily. May need to discontinue if he is having too frequent BMs as this may be contributing to AKI/dehydration/hypotension. Monitor closely.   Continue to monitor CBC, CMP, INR daily.  2g sodium diet. Needs outpatient EGD for variceal screening and follow-up of previously noted patulous esophagus with diffuse thickening on CT completed 7/15. Evaluation by ophthalmologist outpatient to rule out Kayser-Fleischer rings. Consider liver biopsy outpatient if work-up for cirrhosis is unrevealing.     LOS: 1 day    11/28/2021, 9:33 AM   Aliene Altes, PA-C [facility]

## 2021-11-28 NOTE — Procedures (Signed)
PreOperative Dx: Ascites Postoperative Dx: Ascites Procedure:   US guided paracentesis Radiologist:  Thornton Papas Anesthesia:  10 ml of1% lidocaine Specimen:  3.4 L of yellow ascitic fluid EBL:   < 1 ml Complications: None

## 2021-11-28 NOTE — TOC Transition Note (Signed)
Transition of Care [facility]) - CM/SW Discharge Note   Patient Details  Name: TYRIE PORZIO MRN: [id number] Date of Birth: Dec 28, 1950  Transition of Care [facility]) CM/SW Contact:  Boneta Lucks, RN Phone Number: 11/28/2021, 4:08 PM   Clinical Narrative:   Patient admitted with pneumonia with a high risk for readmission. Patient states he is living with his x-wife and son at the present time. He has a walker. He is no needs at this time. TOC following for discharge plan in 3 days.   Final next level of care: Home/Self Care    Patient Goals and CMS Choice Patient states their goals for this hospitalization and ongoing recovery are:: to go home. CMS Medicare.gov Compare Post Acute Care list provided to:: Patient Choice offered to / list presented to : Patient  Discharge Placement      Readmission Risk Interventions    11/28/2021    4:08 PM 11/22/2021    2:53 PM  Readmission Risk Prevention Plan  Transportation Screening Complete Complete  PCP or Specialist Appt within 3-5 Days Not Complete   Home Care Screening  Patient refused  Medication Review (RN CM)  Complete  HRI or Home Care Consult Complete   Social Work Consult for [place] Planning/Counseling Complete   Palliative Care Screening Complete   Medication Review Press photographer) Complete

## 2021-11-28 NOTE — Consult Note (Signed)
[facility] Renal Consultation Note  Requesting MD: Irwin Brakeman, MD Indication for Consultation:  AKI   Chief complaint: weakness and swelling   HPI:  Edward [name] is a 71 y.o. male with a history of cirrhosis, CKD, CAD, type 2 DM, and HTN who presented to the hospital with fatigue and weakness as well as lower extremity and scrotal edema.  He states that he has had spells of time where is it difficult to breathe.  Denies n/v but reports diarrhea which he relates to lactulose.  He has found to have AKI.  Note that for his CKD he follows with Dr. Theador Hawthorne.  Creatinine trends as below. Note hx of recent admission with several days of IV fluids due to elevated CK levels.  Recently mid 1's may be his baseline creatinine.  Nephrology is consulted for assistance with management.  He is on a lasix gtt at the time of consult which is running at 4 mg/hr.  Note hypotension earlier this am per charting and he was given a bolus of fluids for the same per charting.  Note that he recently had a contrasted CT scan (ab/pelvis on 11/23/21.  The CT demonstrated mild bilateral renal atrophy and no hydro; bladder noted to be minimally distended.  He denies any NSAID use.     Creatinine, Ser  Date/Time Value Ref Range Status  11/28/2021 04:47 AM 2.89 (H) 0.61 - 1.24 mg/dL Final  11/27/2021 03:00 PM 2.64 (H) 0.61 - 1.24 mg/dL Final  11/23/2021 10:03 PM 1.58 (H) 0.61 - 1.24 mg/dL Final  11/22/2021 06:10 AM 1.27 (H) 0.61 - 1.24 mg/dL Final  11/21/2021 04:15 AM 1.50 (H) 0.61 - 1.24 mg/dL Final  11/20/2021 04:41 AM 1.53 (H) 0.61 - 1.24 mg/dL Final  11/19/2021 04:51 AM 1.60 (H) 0.61 - 1.24 mg/dL Final  11/18/2021 06:44 AM 2.02 (H) 0.61 - 1.24 mg/dL Final  11/14/2021 08:20 AM 1.58 (H) 0.76 - 1.27 mg/dL Final  08/25/2021 09:07 AM 1.57 (H) 0.61 - 1.24 mg/dL Final  08/15/2021 08:59 AM 1.62 (H) 0.76 - 1.27 mg/dL Final  05/23/2021 08:55 AM 1.56 (H) 0.76 - 1.27 mg/dL Final  04/14/2021 08:17 AM 1.74 (H)  0.61 - 1.24 mg/dL Final  02/20/2021 08:34 AM 1.82 (H) 0.76 - 1.27 mg/dL Final  12/08/2020 08:42 AM 1.64 (H) 0.61 - 1.24 mg/dL Final  08/16/2020 10:22 AM 1.56 (H) 0.76 - 1.27 mg/dL Final  05/02/2020 04:02 PM 2.48 (H) 0.76 - 1.27 mg/dL Final  04/28/2020 08:21 AM 2.71 (H) 0.76 - 1.27 mg/dL Final  01/15/2020 09:57 AM 1.53 (H) 0.76 - 1.27 mg/dL Final  10/14/2019 02:32 PM 1.34 (H) 0.61 - 1.24 mg/dL Final  10/02/2019 08:03 AM 1.36 (H) 0.76 - 1.27 mg/dL Final  03/20/2019 08:21 AM 1.62 (H) 0.76 - 1.27 mg/dL Final  12/16/2018 09:21 AM 1.26 0.76 - 1.27 mg/dL Final  04/15/2018 08:56 AM 1.27 0.76 - 1.27 mg/dL Final  10/29/2017 09:05 AM 1.36 (H) 0.76 - 1.27 mg/dL Final  07/26/2017 08:38 AM 1.51 (H) 0.76 - 1.27 mg/dL Final  04/25/2017 09:41 AM 1.76 (H) 0.76 - 1.27 mg/dL Final  01/22/2017 08:10 AM 1.91 (H) 0.76 - 1.27 mg/dL Final  10/12/2016 08:00 AM 1.99 (H) 0.76 - 1.27 mg/dL Final  07/10/2016 08:17 AM 1.94 (H) 0.76 - 1.27 mg/dL Final  03/05/2016 08:43 AM 1.92 (H) 0.76 - 1.27 mg/dL Final  01/03/2016 08:51 AM 2.33 (H) 0.76 - 1.27 mg/dL Final  12/01/2015 08:03 AM 2.05 (H) 0.76 - 1.27 mg/dL Final  08/05/2015 08:05  AM 1.99 (H) 0.76 - 1.27 mg/dL Final  04/18/2015 08:52 AM 1.97 (H) 0.76 - 1.27 mg/dL Final  01/13/2015 09:34 AM 2.25 (H) 0.76 - 1.27 mg/dL Final  10/07/2014 09:27 AM 2.35 (H) 0.76 - 1.27 mg/dL Final  06/30/2014 10:34 AM 2.13 (H) 0.76 - 1.27 mg/dL Final  03/26/2014 09:49 AM 2.04 (H) 0.76 - 1.27 mg/dL Final  12/21/2013 08:50 AM 2.48 (H) 0.76 - 1.27 mg/dL Final  09/14/2013 08:30 AM 2.42 (H) 0.76 - 1.27 mg/dL Final  07/01/2013 12:14 PM 2.08 (H) 0.76 - 1.27 mg/dL Final  06/15/2013 09:11 AM 2.23 (H) 0.76 - 1.27 mg/dL Final  03/11/2013 08:34 AM 2.03 (H) 0.76 - 1.27 mg/dL Final  12/09/2008 10:58 PM 1.33 0.40 - 1.50 mg/dL Final  11/12/2007 12:00 AM 1.4 mg/dL      PMHx:   Past Medical History:  Diagnosis Date   Cataract    CKD (chronic kidney disease) stage 3, GFR 30-59 ml/min (HCC)     Coronary atherosclerosis of native coronary artery    BMS LAD 2006; residual 75% distal RCA; EF 50%   Essential hypertension    Glaucoma    Low back pain    MI (myocardial infarction) ([place])    Anterior 2006   Mixed hyperlipidemia    Renal insufficiency    Type 2 diabetes mellitus ([place])     Past Surgical History:  Procedure Laterality Date   CATARACT EXTRACTION, BILATERAL     CORONARY ANGIOPLASTY WITH STENT PLACEMENT  2006    Family Hx:  Family History  Problem Relation Age of Onset   Aneurysm Father        Brain aneurysm   Lung cancer Mother        Small cell carcinoma of lung,kidney and heart   Heart disease Mother    Cancer Brother        lungs   Diabetes Sister    Diabetes Sister    Diabetes Brother    Diabetes Brother     Social History:  reports that he quit smoking about 34 years ago. His smoking use included cigarettes. He has a 40.00 pack-year smoking history. He has never used smokeless tobacco. He reports that he does not drink alcohol and does not use drugs.  Allergies: No Known Allergies  Medications: Prior to Admission medications   Medication Sig Start Date End Date Taking? Authorizing Provider  acetaminophen (TYLENOL) 500 MG tablet Take 500 mg by mouth every 6 (six) hours as needed.   Yes [provider]  aspirin 81 MG tablet Take 81 mg by mouth daily.   Yes [provider]  brimonidine (ALPHAGAN) 0.2 % ophthalmic solution Place 1 drop into both eyes 2 (two) times daily. 03/14/20  Yes [provider]  Cholecalciferol (VITAMIN D3) 2000 UNITS TABS Take 1 tablet by mouth daily.   Yes [provider]  Cyanocobalamin (B-12 PO) Take by mouth.   Yes [provider]  feeding supplement (ENSURE ENLIVE / ENSURE PLUS) LIQD Take 237 mLs by mouth 2 (two) times daily between meals. 11/22/21  Yes Barton Dubois, MD  glipiZIDE (GLUCOTROL) 5 MG tablet 2 tablets in morning and 1 tablet at night Patient taking differently: Take 5  mg by mouth See admin instructions. 2 tablets in morning and 1 tablet at evening 08/15/21  Yes Martin, Mary-Margaret, FNP  isosorbide mononitrate (IMDUR) 30 MG 24 hr tablet Take 1 tablet (30 mg total) by mouth daily. New 01/30/2018 08/15/21 11/27/21 Yes Hassell Done, Mary-Margaret, FNP  lactulose [facility])  10 GM/15ML solution Take 7.5 mLs (5 g total) by mouth daily. 11/23/21  Yes Barton Dubois, MD  metoprolol tartrate (LOPRESSOR) 25 MG tablet Take 0.5 tablets (12.5 mg total) by mouth in the morning and at bedtime. 08/15/21 08/15/22 Yes Martin, Mary-Margaret, FNP  nitroGLYCERIN (NITROSTAT) 0.4 MG SL tablet Place 1 tablet (0.4 mg total) under the tongue every 5 (five) minutes x 3 doses as needed for chest pain (if no relief after 2nd dose, proceed to the ED for an evaluation). 10/06/20  Yes Satira Sark, MD  Omega-3 Fatty Acids (FISH OIL) 1000 MG CAPS Take by mouth.   Yes [provider]  pantoprazole (PROTONIX) 40 MG tablet Take 1 tablet (40 mg total) by mouth daily. 08/15/21  Yes Martin, Mary-Margaret, FNP  sodium bicarbonate 650 MG tablet Take 1 tablet (650 mg total) by mouth 3 (three) times daily. 11/22/21  Yes Barton Dubois, MD  traZODone (DESYREL) 100 MG tablet Take 1 tablet (100 mg total) by mouth at bedtime. 08/15/21  Yes Hassell Done, Mary-Margaret, FNP  ACCU-CHEK AVIVA PLUS test strip USE TO CHECK BLOOD SUGAR ONCE DAILY 11/12/17   Hassell Done, Mary-Margaret, FNP    I have reviewed the patient's current and reported prior to admission medications.  Labs:     Latest Ref Rng & Units 11/28/2021    4:47 AM 11/27/2021    3:00 PM 11/23/2021   10:03 PM  BMP  Glucose 70 - 99 mg/dL 55  47  134   BUN 8 - 23 mg/dL 48  47  36   Creatinine 0.61 - 1.24 mg/dL 2.89  2.64  1.58   Sodium 135 - 145 mmol/L 131  131  133   Potassium 3.5 - 5.1 mmol/L 3.6  3.8  4.1   Chloride 98 - 111 mmol/L 102  99  103   CO2 22 - 32 mmol/L '20  21  22   '$ Calcium 8.9 - 10.3 mg/dL 7.8  8.4  8.7     Urinalysis    Component Value  Date/Time   COLORURINE YELLOW 11/27/2021 1815   APPEARANCEUR HAZY (A) 11/27/2021 1815   LABSPEC 1.016 11/27/2021 1815   PHURINE 5.0 11/27/2021 1815   GLUCOSEU NEGATIVE 11/27/2021 1815   HGBUR NEGATIVE 11/27/2021 1815   BILIRUBINUR NEGATIVE 11/27/2021 1815   KETONESUR NEGATIVE 11/27/2021 1815   PROTEINUR NEGATIVE 11/27/2021 1815   NITRITE NEGATIVE 11/27/2021 1815   LEUKOCYTESUR NEGATIVE 11/27/2021 1815     ROS:  Pertinent items noted in HPI and remainder of comprehensive ROS otherwise negative.  Physical Exam: Vitals:   11/28/21 0412 11/28/21 0633  BP: (!) 86/36 (!) 97/48  Pulse:  72  Resp:    Temp:    SpO2:       General: elderly male in bed in NAD  HEENT: NCAT Eyes: EOMI sclera anicteric Neck: supple trachea midline Heart: S1S2 no rub Lungs: clear but reduced on auscultation; normal work of breathing at rest and increased with exertion; on room air  Abdomen: soft but distended nontender Extremities: 2+ edema bilateral lower extremities Skin: no rash on extremities exposed Neuro: alert and oriented to person, time and location; provides hx and follows commands GU scrotal edema no foley Psych Normal mood and affect  Assessment/Plan:  # Cirrhosis with ascites - etiology is in question at this point - note per CT scan pt with portal HTN and splenomegaly as well as paraesophageal and perigastric varcies; ascites noted - Primary team has consulted GI  - He is on lactulose  -  paracentesis per primary team and GI discretion - spoke with GI and they are hoping for paracentesis today  # AKI  - Ischemic and pre-renal insults with hypotension.  He has a history of recent IV contrast exposure on 7/20 for a CT scan.    - Stop lasix gtt  - Check post-void residual bladder scan and in/out cath if over 250 mL retained - note with ascites - can slowly optimize volume status once hypotension improves to avoid multiple insults at once    # CKD stage 3 - Follows with Dr. Theador Hawthorne  and baseline Cr mid 1's it appears  # Hypotension - note hx of HTN.  Holding home meds and per charting he received a bolus.  He is on midodrine at this time as well.  Also on abx per primary team   # Hypoglycemia - low rate of dextrose containing fluids per primary team.   # Hyponatremia - Please switch to dextrose with normal saline if will be prolonged need for dextrose containing fluids  # Anemia - macrocytic  - setting of cirrhosis  - mild at this time    Claudia Desanctis 11/28/2021, 10:39 AM

## 2021-11-29 DIAGNOSIS — K7460 Unspecified cirrhosis of liver: Secondary | ICD-10-CM | POA: Diagnosis not present

## 2021-11-29 DIAGNOSIS — E8809 Other disorders of plasma-protein metabolism, not elsewhere classified: Secondary | ICD-10-CM | POA: Diagnosis not present

## 2021-11-29 DIAGNOSIS — E1121 Type 2 diabetes mellitus with diabetic nephropathy: Secondary | ICD-10-CM | POA: Diagnosis not present

## 2021-11-29 DIAGNOSIS — R188 Other ascites: Secondary | ICD-10-CM | POA: Diagnosis not present

## 2021-11-29 DIAGNOSIS — E162 Hypoglycemia, unspecified: Secondary | ICD-10-CM | POA: Diagnosis not present

## 2021-11-29 DIAGNOSIS — J189 Pneumonia, unspecified organism: Secondary | ICD-10-CM | POA: Diagnosis not present

## 2021-11-29 DIAGNOSIS — R601 Generalized edema: Secondary | ICD-10-CM | POA: Diagnosis not present

## 2021-11-29 LAB — GLUCOSE, CAPILLARY
Glucose-Capillary: 104 mg/dL — ABNORMAL HIGH (ref 70–99)
Glucose-Capillary: 266 mg/dL — ABNORMAL HIGH (ref 70–99)
Glucose-Capillary: 280 mg/dL — ABNORMAL HIGH (ref 70–99)
Glucose-Capillary: 72 mg/dL (ref 70–99)
Glucose-Capillary: 86 mg/dL (ref 70–99)
Glucose-Capillary: 95 mg/dL (ref 70–99)

## 2021-11-29 LAB — COMPREHENSIVE METABOLIC PANEL
ALT: 57 U/L — ABNORMAL HIGH (ref 0–44)
AST: 62 U/L — ABNORMAL HIGH (ref 15–41)
Albumin: 3.1 g/dL — ABNORMAL LOW (ref 3.5–5.0)
Alkaline Phosphatase: 39 U/L (ref 38–126)
Anion gap: 10 (ref 5–15)
BUN: 44 mg/dL — ABNORMAL HIGH (ref 8–23)
CO2: 20 mmol/L — ABNORMAL LOW (ref 22–32)
Calcium: 8 mg/dL — ABNORMAL LOW (ref 8.9–10.3)
Chloride: 101 mmol/L (ref 98–111)
Creatinine, Ser: 2.79 mg/dL — ABNORMAL HIGH (ref 0.61–1.24)
GFR, Estimated: 24 mL/min — ABNORMAL LOW (ref >60)
Glucose, Bld: 79 mg/dL (ref 70–99)
Potassium: 3 mmol/L — ABNORMAL LOW (ref 3.5–5.1)
Sodium: 131 mmol/L — ABNORMAL LOW (ref 135–145)
Total Bilirubin: 1.9 mg/dL — ABNORMAL HIGH (ref 0.3–1.2)
Total Protein: 5.2 g/dL — ABNORMAL LOW (ref 6.5–8.1)

## 2021-11-29 LAB — CBC
HCT: 25.7 % — ABNORMAL LOW (ref 39.0–52.0)
Hemoglobin: 8.5 g/dL — ABNORMAL LOW (ref 13.0–17.0)
MCH: 33.1 pg (ref 26.0–34.0)
MCHC: 33.1 g/dL (ref 30.0–36.0)
MCV: 100 fL (ref 80.0–100.0)
Platelets: 54 10*3/uL — ABNORMAL LOW (ref 150–400)
RBC: 2.57 MIL/uL — ABNORMAL LOW (ref 4.22–5.81)
RDW: 15.9 % — ABNORMAL HIGH (ref 11.5–15.5)
WBC: 3.7 10*3/uL — ABNORMAL LOW (ref 4.0–10.5)
nRBC: 0 % (ref 0.0–0.2)

## 2021-11-29 LAB — MAGNESIUM: Magnesium: 2.3 mg/dL (ref 1.7–2.4)

## 2021-11-29 LAB — PROTIME-INR
INR: 1.8 — ABNORMAL HIGH (ref 0.8–1.2)
Prothrombin Time: 20.6 seconds — ABNORMAL HIGH (ref 11.4–15.2)

## 2021-11-29 MED ORDER — MIDODRINE HCL 5 MG PO TABS
10.0000 mg | ORAL_TABLET | Freq: Three times a day (TID) | ORAL | Status: DC
  Administered 2021-11-29 (×2): 10 mg via ORAL
  Filled 2021-11-29 (×3): qty 2

## 2021-11-29 MED ORDER — ALBUMIN HUMAN 25 % IV SOLN
50.0000 g | Freq: Three times a day (TID) | INTRAVENOUS | Status: AC
  Administered 2021-11-29 – 2021-11-30 (×6): 50 g via INTRAVENOUS
  Filled 2021-11-29 (×6): qty 200

## 2021-11-29 MED ORDER — LACTULOSE 10 GM/15ML PO SOLN
20.0000 g | Freq: Two times a day (BID) | ORAL | Status: DC
  Administered 2021-11-29 – 2021-12-02 (×8): 20 g via ORAL
  Filled 2021-11-29 (×7): qty 30

## 2021-11-29 MED ORDER — OCTREOTIDE ACETATE 100 MCG/ML IJ SOLN
100.0000 ug | Freq: Three times a day (TID) | INTRAMUSCULAR | Status: DC
  Administered 2021-11-29 – 2021-12-02 (×12): 100 ug via SUBCUTANEOUS
  Filled 2021-11-29 (×17): qty 1

## 2021-11-29 MED ORDER — TAMSULOSIN HCL 0.4 MG PO CAPS
0.4000 mg | ORAL_CAPSULE | Freq: Every day | ORAL | Status: DC
  Administered 2021-11-29: 0.4 mg via ORAL
  Filled 2021-11-29: qty 1

## 2021-11-29 NOTE — Progress Notes (Signed)
PROGRESS NOTE   Edward [name] [facility]  KAJ:[id number] DOB: 1951-04-11 DOA: 11/27/2021 PCP: [name], FNP   Chief Complaint  Patient presents with   Hypotension   Level of care: Telemetry  Brief Admission History:   71 y.o. male with past medical history relevant for moderate thrombocytopenia, macrocytic anemia with superimposed iron deficiency, copper deficiency, CKD 3B, HLD, CAD, DM2 , liver cirrhosis and gallstones and HTN who presents to the ED with worsening fatigue, weakness, myalgias and malaise and increasing lower extremity and scrotal edema -Patient was discharged on 11/24/2019 after IV fluids for rhabdomyolysis -Additional history obtained from patient's ex wife and sister-in-law at bedside Pt was sent over from PCPs clinic due to increasing edema and soft blood pressure -Patient endorses dyspnea at rest and dyspnea on exertion but no frank chest pains, no leg pains or pleuritic symptoms -Troponin is 11 -UA is not suggestive of UTI -Lactic acid is 1.9 -Sodium 131, glucose 47 -Lipase is 151 patient has chronically elevated lipase -Total CK down to 367 -Creatinine is up to 2.64 creatinine was 1.58 on 11/23/2021 -LFTs continue to trend down since discharge, INR 1.4 which is close to patient's baseline -WBC 8.4 hemoglobin 11.6 which is close to prior baseline platelets 97 which is higher than prior baseline -Chest x-ray with possible pneumonia no CHF type findings   Assessment and Plan:  1)Volume Overload/Anasarca--- secondary to decompensated liver cirrhosis -Now presenting with significant lower extremity edema including scrotal and penile edema -Blood pressure is soft and started on midodrine 5 mg TID and iv albumin -lasix on hold by nephrology team for now   2)AKI--acute kidney injury on CKD stage -3A    --Creatinine bumped on IV lasix which is being held now - consulted with nephrologist - see recommendations - renally adjust medications, avoid nephrotoxic agents  / dehydration  / hypotension - patient follows with Dr. Theador Hawthorne on outpatient basis    3)Liver Cirrhosis with Ascites and Portal Hypertension---?Etiology -No significant alcohol use -LFTs being trended -Patient was previously on atorvastatin and copper supplements -Viral hepatitis profile was negative (negative for hep C and hep B may have had hep A infection in the past with antibodies noted) --Ultrasound from 25-Nov-2021 reveals not enough fluid for paracentesis -Patient work-up by GI service included ANA, mitochondrial antibodies, anti-smooth antibodies, IgG IgA and IgM - -Pt would benefit from EGD and liver biopsy     4)-cholelithiasis - CT abdomen and pelvis and right upper quadrant ultrasound consistent with gallstones and cholecystitis -Recent surgical consult appreciated -Recent GI consult appreciated HIDA scan with cystic duct patency noted- -May need liver biopsy as outpatient -EGD as outpatient to rule out varices    5)History of copper deficiency--- has been on copper replacement for over 1 year -Random Urine Copper high at 72 (09/12/21) -Hold copper tablets as copper toxicity can lead to hepatic dysfunction -Discussed with patient's hematologist Dr. Delton Coombes--- liver biopsy will be able to tell if patient has copper toxicity or not   6)Anemia and Thrombocytopenia--- with macrocytosis --diagnosed in May 2021 -- SPEP in June 2021 was normal.  Erythropoietin normal at 11.9. -Previously declined bone marrow biopsy for evaluation of possible early MDS -Previously treated with copper supplements -Prior work-up revealed somewhat low iron --- B12 and Folate were Not low -hemoglobin 11.6 which is close to prior baseline platelets 97 which is higher than prior baseline   7)DM2---  A1c- 6.7 on 11/14/2021.[name]  Reflecting good diabetic control PTA -Patient having episodes of severe hypoglycemia,  Decrease  d10 infusion to 30 ml/hr Use Novolog/Humalog Sliding scale insulin with  Accu-Cheks/Fingersticks as ordered   8)-Esophagitis--- recent CT chest shows -Patulous and thickened esophagus. -Protonix as ordered EGD as outpatient to be done as noted above #1   9)Social/ethics--- Discussed with patient's ex-wife and and sister-in-law at bedside -Patient is a full code   10)Recurrent severe Hypoglycemia----worsened by sulfonylurea use, impaired gluconeogenesis and impaired glucagon due to decompensated liver disease, due to poor oral intake in setting of underlying liver cirrhosis, I  -Insulin therapy discontinued -feed liberally and frequently   11)CAP_-patient with some shortness of breath, chest x-ray suggestive of pneumonia -c/n ceftriaxone/azithromycin along with bronchodilators and mucolytics   Disposition/Need for in-Hospital Stay- patient unable to be discharged at this time due to -anasarca/volume overload, decompensated liver cirrhosis    DVT prophylaxis: SCDs Code Status: Full  Family Communication: none present Disposition: Home with home health versus SNF rehab  Status is: Inpatient Remains inpatient appropriate because: intensity    Consultants:  GI Nephrology   Procedures:  Paracentesis 7/25 ~3.4L ascitic fluid removed Antimicrobials:  Ceftriaxone/azithromycin    Subjective: Foley removed 11/29/2021 having difficulty voiding -No fevers no shortness of breath at rest  Objective: Vitals:   11/28/21 1433 11/28/21 2009 11/29/21 0425 11/29/21 1341  BP: (!) 97/59 (!) 95/44 (!) 101/56 100/62  Pulse: 63 72 72 (!) 56  Resp: _0 Temp: 97.6 F (36.4 C) 97.9 F (36.6 C) 98.2 F (36.8 C) 98.2 F (36.8 C)  TempSrc: Oral Oral Oral   SpO2: 99% 97% 95% 99%  Weight:   84.2 kg   Height:   5' 10" (1.778 m)     Intake/Output Summary (Last 24 hours) at 11/29/2021 1834 Last data filed at 11/29/2021 1812 Gross per 24 hour  Intake 2070.66 ml  Output 1100 ml  Net 970.66 ml   Filed Weights   11/27/21 1412 11/28/21 0412 11/29/21 0425   Weight: 85.7 kg 86.7 kg 84.2 kg   Examination:  Physical Examination: General appearance - alert,  in no distress, speaking in complete sentences Mental status - alert, oriented to person, place, and time,  Eyes - sclera anicteric Neck - supple, no JVD elevation , Chest -fair air movement bilaterally, no wheezing  heart - S1 and S2 normal, regular  Abdomen - soft, nontender, nondistended, +BS Neurological - screening mental status exam normal, neck supple without rigidity, cranial nerves II through XII intact, DTR's normal and symmetric Extremities -+3 pedal edema noted, intact peripheral pulses  Skin - warm, dry GU--scrotal  and penile edema  Data Reviewed: I have personally reviewed following labs and imaging studies  CBC: Recent Labs  Lab 11/23/21 2203 11/27/21 1500 11/28/21 1153 11/29/21 0456  WBC 8.8 8.4 5.6 3.7*  NEUTROABS  --  6.0  --   --   HGB 12.4* 11.6* 9.8* 8.5*  HCT 37.0* 34.0* 28.2* 25.7*  MCV 99.5 98.6 98.6 100.0  PLT 93* 97* 63* 54*    Basic Metabolic Panel: Recent Labs  Lab 11/23/21 2203 11/27/21 1500 11/28/21 0447 11/29/21 0456  NA 133* 131* 131* 131*  K 4.1 3.8 3.6 3.0*  CL 103 99 102 101  CO2 22 21* 20* 20*  GLUCOSE 134* 47* 55* 79  BUN 36* 47* 48* 44*  CREATININE 1.58* 2.64* 2.89* 2.79*  CALCIUM 8.7* 8.4* 7.8* 8.0*  MG  --  2.5*  --  2.3   CBG: Recent Labs  Lab 11/29/21 0035 11/29/21 0427 11/29/21 0707 11/29/21 1106 11/29/21  [place] 86 104* 72 95 280*    Recent Results (from the past 240 hour(s))  Gram stain     Status: None   Collection Time: 11/28/21  1:50 PM   Specimen: Peritoneal Cavity  Result Value Ref Range Status   Specimen Description PERITONEAL CAVITY  Final   Special Requests PERITONEAL CAVITY  Final   Gram Stain   Final    WBC PRESENT, PREDOMINANTLY MONONUCLEAR NO ORGANISMS SEEN CYTOSPIN SMEAR Performed at [facility], [address], [place], [place] [address]    Report Status 11/28/2021 FINAL  Final   Culture, body fluid w Gram Stain-bottle     Status: None (Preliminary result)   Collection Time: 11/28/21  1:50 PM   Specimen: Peritoneal Cavity  Result Value Ref Range Status   Specimen Description PERITONEAL CAVITY  Final   Special Requests   Final    BOTTLES DRAWN AEROBIC AND ANAEROBIC Blood Culture adequate volume 10cc   Culture   Final    NO GROWTH < 24 HOURS Performed at [facility], [address], [place], [place] [address]    Report Status PENDING  Incomplete     Radiology Studies: US Paracentesis  Result Date: 11/28/2021 INDICATION: Ascites EXAM: ULTRASOUND GUIDED DIAGNOSTIC AND THERAPEUTIC PARACENTESIS MEDICATIONS: None. COMPLICATIONS: None immediate. PROCEDURE: Informed written consent was obtained from the patient after a discussion of the risks, benefits and alternatives to treatment. A timeout was performed prior to the initiation of the procedure. Initial ultrasound scanning demonstrates a large amount of ascites within the right lower abdominal quadrant. The right lower abdomen was prepped and draped in the usual sterile fashion. 1% lidocaine was used for local anesthesia. Following this, a 19 gauge, 7-cm, Yueh catheter was introduced. An ultrasound image was saved for documentation purposes. The paracentesis was performed. The catheter was removed and a dressing was applied. The patient tolerated the procedure well without immediate post procedural complication. FINDINGS: A total of approximately 3.4 L of yellow ascitic fluid was removed. Samples were sent to the laboratory as requested by the clinical team. IMPRESSION: Successful ultrasound-guided paracentesis yielding 3.4 liters of peritoneal fluid. Electronically Signed   By: Lavonia Dana M.D.   On: 11/28/2021 14:25   [place] EKG SITE RITE  Result Date: 11/28/2021 If Site Rite image not attached, placement could not be confirmed due to current cardiac rhythm.   Scheduled Meds:  aspirin  81 mg Oral Daily   brimonidine  1 drop  Both Eyes BID   Chlorhexidine Gluconate Cloth  6 each Topical Daily   dextromethorphan-guaiFENesin  1 tablet Oral BID   heparin  5,000 Units Subcutaneous Q8H   lactulose  20 g Oral BID   midodrine  10 mg Oral TID WC   octreotide  100 mcg Subcutaneous TID   pantoprazole  40 mg Oral Daily   sodium chloride flush  3 mL Intravenous Q12H   sodium chloride flush  3 mL Intravenous Q12H   tamsulosin  0.4 mg Oral QPC supper   traZODone  100 mg Oral QHS   Continuous Infusions:  sodium chloride     albumin human 60 mL/hr at 11/29/21 1812   azithromycin Stopped (11/29/21 0041)   cefTRIAXone (ROCEPHIN)  IV 1 g (11/29/21 0054)   dextrose 50 mL/hr at 11/29/21 1812    LOS: 2 days   Roxan Hockey, MD How to contact the [facility] Attending or Consulting provider [place] or covering provider during after hours [place], for this patient?  Check the care team in [facility] and look for a) attending/consulting [facility] provider listed and b) the [facility] team listed Log into [email or web address] and use [place]'s universal password to access. If you do not have the password, please contact the hospital operator. Locate the [facility] provider you are looking for under [facility] and page to a number that you can be directly reached. If you still have difficulty reaching the provider, please page the [facility] (Director on Call) for the Hospitalists listed on amion for assistance.  11/29/2021, 6:34 PM

## 2021-11-29 NOTE — Progress Notes (Signed)
Patient CBG 68, Charge nurse and Dr. Clearence Ped notified, order for Dextrose 10% to be increased from 35m/hr to 539mhr. Patient given a cola and had part of a milkyway in room. Recheck of CBG 86. MD updated with recheck. Will continue to monitor patient.

## 2021-11-29 NOTE — Progress Notes (Addendum)
[facility] Progress Note   Assessment/ Plan:   # Cirrhosis with ascites - s/p paracentesis 11/28/21 with 3.4L off - on albumin challenge  - increasing midodrine to 10 mg TID and adding ocreotide for possible HRS - on cefriaxone and azithro (likely covering CAP and possible SBP but paracentesis looks noninfected) - adding fluid restriction   # AKI/ possible HRS - Ischemic and pre-renal insults with hypotension.  He has a history of recent IV contrast exposure on 7/20 for a CT scan.  Possible HRS complicating whole picture - off lasix gtt - on albumin/ midodrine/ octreotide  - d/c Foley, bladder scan - will add back lasix when BP better- he needs to get fluid off      # Hypoglycemia - low rate of dextrose containing fluids per primary team.--> hopefully can stop soon    # Hyponatremia - Please switch to dextrose with normal saline if will be prolonged need for dextrose containing fluids   # Anemia - macrocytic  - setting of cirrhosis  - mild at this time   #CAP: - on azithro and ceftriaxone    Subjective:    Seen in room.  Had low blood glucose last night.  On a D10 infusion.  Cr appears to be plateauing.  Bp still low.  Had 3.4L paracentesis yesterday.   Objective:   BP (!) 101/56 (BP Location: Left Arm)   Pulse 72   Temp 98.2 F (36.8 C) (Oral)   Resp 17   Ht '5\' 10"'$  (1.778 m)   Wt 84.2 kg   SpO2 95%   BMI 26.63 kg/m   Intake/Output Summary (Last 24 hours) at 11/29/2021 0940 Last data filed at 11/29/2021 0753 Gross per 24 hour  Intake 1631.79 ml  Output 1100 ml  Net 531.79 ml   Weight change: -1.542 kg  Physical Exam: Gen:NAD, pale CVS:RRR Resp:clear QQP:[id number] with + fluid wave Ext: 3+ edema to the flank  Imaging: US Paracentesis  Result Date: 11/28/2021 INDICATION: Ascites EXAM: ULTRASOUND GUIDED DIAGNOSTIC AND THERAPEUTIC PARACENTESIS MEDICATIONS: None. COMPLICATIONS: None immediate. PROCEDURE: Informed written consent was obtained  from the patient after a discussion of the risks, benefits and alternatives to treatment. A timeout was performed prior to the initiation of the procedure. Initial ultrasound scanning demonstrates a large amount of ascites within the right lower abdominal quadrant. The right lower abdomen was prepped and draped in the usual sterile fashion. 1% lidocaine was used for local anesthesia. Following this, a 19 gauge, 7-cm, Yueh catheter was introduced. An ultrasound image was saved for documentation purposes. The paracentesis was performed. The catheter was removed and a dressing was applied. The patient tolerated the procedure well without immediate post procedural complication. FINDINGS: A total of approximately 3.4 L of yellow ascitic fluid was removed. Samples were sent to the laboratory as requested by the clinical team. IMPRESSION: Successful ultrasound-guided paracentesis yielding 3.4 liters of peritoneal fluid. Electronically Signed   By: Lavonia Dana M.D.   On: 11/28/2021 14:25   [place] EKG SITE RITE  Result Date: 11/28/2021 If Site Rite image not attached, placement could not be confirmed due to current cardiac rhythm.  DG Chest Port 1 View  Result Date: 11/27/2021 CLINICAL DATA:  Shortness of breath EXAM: PORTABLE CHEST 1 VIEW COMPARISON:  11/21/2021 FINDINGS: Cardiac size is within normal limits. There are no signs of pulmonary edema. Linear densities are seen in both lower lung fields with slight improvement. No new focal infiltrates are seen. There is no pleural effusion  or pneumothorax. IMPRESSION: Linear densities are seen in the lower lung fields suggesting atelectasis/pneumonia. There is interval improvement in the aeration of both lower lung fields. There are no new infiltrates or signs of pulmonary edema. Electronically Signed   By: Elmer Picker M.D.   On: 11/27/2021 15:57    Labs: BMET Recent Labs  Lab 11/23/21 2203 11/27/21 1500 11/28/21 0447 11/29/21 0456  NA 133* 131* 131* 131*   K 4.1 3.8 3.6 3.0*  CL 103 99 102 101  CO2 22 21* 20* 20*  GLUCOSE 134* 47* 55* 79  BUN 36* 47* 48* 44*  CREATININE 1.58* 2.64* 2.89* 2.79*  CALCIUM 8.7* 8.4* 7.8* 8.0*   CBC Recent Labs  Lab 11/23/21 2203 11/27/21 1500 11/28/21 1153 11/29/21 0456  WBC 8.8 8.4 5.6 3.7*  NEUTROABS  --  6.0  --   --   HGB 12.4* 11.6* 9.8* 8.5*  HCT 37.0* 34.0* 28.2* 25.7*  MCV 99.5 98.6 98.6 100.0  PLT 93* 97* 63* 54*    Medications:     aspirin  81 mg Oral Daily   brimonidine  1 drop Both Eyes BID   Chlorhexidine Gluconate Cloth  6 each Topical Daily   dextromethorphan-guaiFENesin  1 tablet Oral BID   heparin  5,000 Units Subcutaneous Q8H   lactulose  20 g Oral BID   midodrine  10 mg Oral TID WC   octreotide  100 mcg Subcutaneous TID   pantoprazole  40 mg Oral Daily   sodium chloride flush  3 mL Intravenous Q12H   sodium chloride flush  3 mL Intravenous Q12H   traZODone  100 mg Oral QHS    Madelon Lips, MD 11/29/2021, 9:40 AM

## 2021-11-29 NOTE — Care Management Important Message (Signed)
Important Message  Patient Details  Name: Edward [name] MRN: [id number] Date of Birth: 03/19/1951   Medicare Important Message Given:  N/A - LOS <3 / Initial given by admissions     Tommy Medal 11/29/2021, 10:17 AM

## 2021-11-29 NOTE — Progress Notes (Signed)
Patients foley removed around 10:30 this am. Patient has not urinated yet, 259m in his bladder per bladder scan. Patient notified to drink fluids.

## 2021-11-29 NOTE — Progress Notes (Signed)
Subjective: Patient states he is feeling tired today. He is a/ox4. He states he feels fluid status is about the same though abdomen feels somewhat less distended. Denies pain, nausea, vomiting. No BMs this morning, last was yesterday morning.   Objective: Vital signs in last 24 hours: Temp:  [97.5 F (36.4 C)-98.2 F (36.8 C)] 98.2 F (36.8 C) (07/26 0425) Pulse Rate:  [60-72] 72 (07/26 0425) Resp:  [17-18] 17 (07/26 0425) BP: (91-110)/(44-67) 101/56 (07/26 0425) SpO2:  [95 %-99 %] 95 % (07/26 0425) Weight:  [84.2 kg] 84.2 kg (07/26 0425) Last BM Date : 11/28/21 General:   Alert and oriented, pleasant Head:  Normocephalic and atraumatic. Eyes:  No icterus, sclera clear. Conjuctiva pink.  Mouth:  Without lesions, mucosa pink and moist.  Neck:  Supple, without thyromegaly or masses.  Heart:  S1, S2 present, no murmurs noted.  Lungs: Clear to auscultation bilaterally, without wheezing, rales, or rhonchi.  Abdomen:  Bowel sounds present, soft, non-tender. Abdomen is distended, mildly tense but not taut. No HSM or hernias noted. No rebound or guarding. No masses appreciated  Msk:  Symmetrical without gross deformities. Normal posture. Pulses:  Normal pulses noted. Extremities:  3+ pitting edema Neurologic:  Alert and  oriented x4;   Very mild asterixis Skin:  Warm and dry, intact without significant lesions.  Psych:  Alert and cooperative. Normal mood and affect.  Intake/Output from previous day: 07/25 0701 - 07/26 0700 In: 1180.1 [P.O.:240; I.V.:639.3; IV Piggyback:300.8] Out: 1100 [Urine:1100] Intake/Output this shift: Total I/O In: 540.8 [I.V.:181.9; IV Piggyback:358.9] Out: -   Lab Results: Recent Labs    11/27/21 1500 11/28/21 1153 11/29/21 0456  WBC 8.4 5.6 3.7*  HGB 11.6* 9.8* 8.5*  HCT 34.0* 28.2* 25.7*  PLT 97* 63* 54*   BMET Recent Labs    11/27/21 1500 11/28/21 0447 11/29/21 0456  NA 131* 131* 131*  K 3.8 3.6 3.0*  CL 99 102 101  CO2 21* 20* 20*   GLUCOSE 47* 55* 79  BUN 47* 48* 44*  CREATININE 2.64* 2.89* 2.79*  CALCIUM 8.4* 7.8* 8.0*   LFT Recent Labs    11/27/21 1500 11/28/21 1153 11/29/21 0456  PROT 5.8* 4.9* 5.2*  ALBUMIN 2.5* 2.1* 3.1*  AST 113* 91* 62*  ALT 101* 84* 57*  ALKPHOS 60 53 39  BILITOT 2.2* 1.9* 1.9*  BILIDIR  --  0.6*  --   IBILI  --  1.3*  --    PT/INR Recent Labs    11/27/21 1500 11/28/21 1153  LABPROT 16.7* 17.9*  INR 1.4* 1.5*    Studies/Results: US Paracentesis  Result Date: 11/28/2021 INDICATION: Ascites EXAM: ULTRASOUND GUIDED DIAGNOSTIC AND THERAPEUTIC PARACENTESIS MEDICATIONS: None. COMPLICATIONS: None immediate. PROCEDURE: Informed written consent was obtained from the patient after a discussion of the risks, benefits and alternatives to treatment. A timeout was performed prior to the initiation of the procedure. Initial ultrasound scanning demonstrates a large amount of ascites within the right lower abdominal quadrant. The right lower abdomen was prepped and draped in the usual sterile fashion. 1% lidocaine was used for local anesthesia. Following this, a 19 gauge, 7-cm, Yueh catheter was introduced. An ultrasound image was saved for documentation purposes. The paracentesis was performed. The catheter was removed and a dressing was applied. The patient tolerated the procedure well without immediate post procedural complication. FINDINGS: A total of approximately 3.4 L of yellow ascitic fluid was removed. Samples were sent to the laboratory as requested by the clinical team. IMPRESSION: Successful  ultrasound-guided paracentesis yielding 3.4 liters of peritoneal fluid. Electronically Signed   By: Lavonia Dana M.D.   On: 11/28/2021 14:25   [place] EKG SITE RITE  Result Date: 11/28/2021 If Site Rite image not attached, placement could not be confirmed due to current cardiac rhythm.  DG Chest Port 1 View  Result Date: 11/27/2021 CLINICAL DATA:  Shortness of breath EXAM: PORTABLE CHEST 1 VIEW  COMPARISON:  11/21/2021 FINDINGS: Cardiac size is within normal limits. There are no signs of pulmonary edema. Linear densities are seen in both lower lung fields with slight improvement. No new focal infiltrates are seen. There is no pleural effusion or pneumothorax. IMPRESSION: Linear densities are seen in the lower lung fields suggesting atelectasis/pneumonia. There is interval improvement in the aeration of both lower lung fields. There are no new infiltrates or signs of pulmonary edema. Electronically Signed   By: Elmer Picker M.D.   On: 11/27/2021 15:57    Assessment: Edward [name] is a 71 year old male  with pmh significant for moderate thrombocytopenia, macrocytic anemia, copper deficiency following with hematology, CKD, HLD, CAD, DM 2,HTN, cirrhosis complicated by ascites and portal hypertension with paraesophageal and perigastric varices per imaging, gallstones, recent admission and discharge on 7/19 with rhabdomyolysis and also found to have new diagnosis of cirrhosis as well as elevated LFTs with negative work-up while inpatient though Wilsons disease has been in question.  He presented to the emergency room 7/24 with worsening fatigue, weakness, increasing lower extremity edema and abdominal distention, found to have significant anasarca on exam, labs with AKI, chest x-ray with possible atelectasis/pneumonia.  He was admitted, GI and nephrology consulted to assist with management.  Decompensated cirrhosis:MELD 25 on admission, Today 28 complicated by ascites, portal HTN with paraesophageal and perigastric varices per imaging, coagulopathy and thrombocytopenia/elevated INR. Anasarca and 3+ pitting edema diffusely in LEs with scrotal edema and worsening ascites. Ammonia was mildly elevated during last admission and he was started on lactulose at that time, though, Some asterixis today, therefore lactulose dose was increased to 20g BID. Low dose lasix ggt started Monday, however, due to  worsening creatnine this is now on hold per nephrology with hopes of resuming in the next few days given renal function improves. Paracentesis done yesterday yielded 3.4L without evidence of SBP, he is receiving albumin 50g TID.  Etiology of Cirrhosis is unclear.  Wilson's disease has been in question due to low ceruloplasmin elevated 24-hour urine copper (on copper supplementation).  will need ophthalmology evaluation to rule out [facility] rings.  Additional prior work-up has been negative for AIH, viral hepatitis. Ferritin is not elevated. We have previously recommended considering outpatient liver biopsy if work-up is unrevealing. He will also need outpatient EGD for variceal screening.   Elevated LFTs: acutely elevated LFTs  during last admission thought secondary to acute rhabdomyolysis.  LFTs continue to improve, though INR 1.8 today, up from 1.4 on admission. Work-up during last admission negative for viral hepatitis, AIH.  Wilsons disease remains in question due to outpatient low ceruloplasmin elevated 24-hour urine copper (while on copper supplementation).  We had recommended ophthalmology evaluation to rule out [facility] rings and can consider liver biopsy if work-up is unrevealing.  will continue to monitor LFts.   AKI: creatnine 2.64 on admission, up from 1.58 on 7/20. Today creatnine is 2.79.  Nephrology is following, likely multifactorial in setting of ischemic and pre renal insults with hypotension as well as IV contrast on 7/20 for CT imaging. Lasix on  hold for now. Ultimately, patient may require dialysis if renal function continues to trend up, appreciate nephrology in put.   Anemia: chronic, baseline 11-12 range hgb. Follows with hematology. No IDA, B12 or folate deficiency. Hgb remains stable without overt GI bleeding. Last TCS  10 years ago with [facility], 2 polyps, no previous EGD.   Plan: MELD labs, INR daily Continue to hold diurietics per nephrology Continue  Lactulose with goal of 2-3 soft BMs per day Continue albumin 50g TID Monitor for signs of HE 2g sodium diet Outpatient EGD for variceal screening/follow up of patulous esophagus/thickening noted on recent CT Ophthalmologist evaluation of [facility] rings Consider liver bx if etiology of cirrhosis is not determined with the above mentioned evaluations   LOS: 2 days    11/29/2021, 9:51 AM   Faustino Luecke L. Alver Sorrow, MSN, APRN, AGNP-C Adult-Gerontology Nurse Practitioner [facility]

## 2021-11-30 DIAGNOSIS — K7460 Unspecified cirrhosis of liver: Secondary | ICD-10-CM | POA: Diagnosis not present

## 2021-11-30 DIAGNOSIS — R188 Other ascites: Secondary | ICD-10-CM | POA: Diagnosis not present

## 2021-11-30 DIAGNOSIS — R601 Generalized edema: Secondary | ICD-10-CM | POA: Diagnosis not present

## 2021-11-30 LAB — COMPREHENSIVE METABOLIC PANEL
ALT: 40 U/L (ref 0–44)
AST: 47 U/L — ABNORMAL HIGH (ref 15–41)
Albumin: 4.1 g/dL (ref 3.5–5.0)
Alkaline Phosphatase: 30 U/L — ABNORMAL LOW (ref 38–126)
Anion gap: 10 (ref 5–15)
BUN: 44 mg/dL — ABNORMAL HIGH (ref 8–23)
CO2: 21 mmol/L — ABNORMAL LOW (ref 22–32)
Calcium: 8.2 mg/dL — ABNORMAL LOW (ref 8.9–10.3)
Chloride: 101 mmol/L (ref 98–111)
Creatinine, Ser: 2.73 mg/dL — ABNORMAL HIGH (ref 0.61–1.24)
GFR, Estimated: 24 mL/min — ABNORMAL LOW (ref >60)
Glucose, Bld: 196 mg/dL — ABNORMAL HIGH (ref 70–99)
Potassium: 3.7 mmol/L (ref 3.5–5.1)
Sodium: 132 mmol/L — ABNORMAL LOW (ref 135–145)
Total Bilirubin: 2.6 mg/dL — ABNORMAL HIGH (ref 0.3–1.2)
Total Protein: 5.6 g/dL — ABNORMAL LOW (ref 6.5–8.1)

## 2021-11-30 LAB — GLUCOSE, CAPILLARY
Glucose-Capillary: 200 mg/dL — ABNORMAL HIGH (ref 70–99)
Glucose-Capillary: 212 mg/dL — ABNORMAL HIGH (ref 70–99)
Glucose-Capillary: 221 mg/dL — ABNORMAL HIGH (ref 70–99)
Glucose-Capillary: 222 mg/dL — ABNORMAL HIGH (ref 70–99)
Glucose-Capillary: 226 mg/dL — ABNORMAL HIGH (ref 70–99)
Glucose-Capillary: 243 mg/dL — ABNORMAL HIGH (ref 70–99)
Glucose-Capillary: 245 mg/dL — ABNORMAL HIGH (ref 70–99)

## 2021-11-30 LAB — CBC
HCT: 22.3 % — ABNORMAL LOW (ref 39.0–52.0)
Hemoglobin: 7.6 g/dL — ABNORMAL LOW (ref 13.0–17.0)
MCH: 34.2 pg — ABNORMAL HIGH (ref 26.0–34.0)
MCHC: 34.1 g/dL (ref 30.0–36.0)
MCV: 100.5 fL — ABNORMAL HIGH (ref 80.0–100.0)
Platelets: 48 10*3/uL — ABNORMAL LOW (ref 150–400)
RBC: 2.22 MIL/uL — ABNORMAL LOW (ref 4.22–5.81)
RDW: 15.9 % — ABNORMAL HIGH (ref 11.5–15.5)
WBC: 3.2 10*3/uL — ABNORMAL LOW (ref 4.0–10.5)
nRBC: 0 % (ref 0.0–0.2)

## 2021-11-30 LAB — PROTIME-INR
INR: 2.1 — ABNORMAL HIGH (ref 0.8–1.2)
Prothrombin Time: 23 seconds — ABNORMAL HIGH (ref 11.4–15.2)

## 2021-11-30 LAB — SODIUM, URINE, RANDOM: Sodium, Ur: 15 mmol/L

## 2021-11-30 MED ORDER — MIDODRINE HCL 5 MG PO TABS
20.0000 mg | ORAL_TABLET | Freq: Three times a day (TID) | ORAL | Status: DC
  Administered 2021-11-30 – 2021-12-02 (×9): 20 mg via ORAL
  Filled 2021-11-30 (×9): qty 4

## 2021-11-30 MED ORDER — MEGESTROL ACETATE 400 MG/10ML PO SUSP
400.0000 mg | Freq: Every day | ORAL | Status: DC
  Administered 2021-11-30 – 2021-12-02 (×3): 400 mg via ORAL
  Filled 2021-11-30 (×3): qty 10

## 2021-11-30 MED ORDER — TAMSULOSIN HCL 0.4 MG PO CAPS
0.4000 mg | ORAL_CAPSULE | Freq: Two times a day (BID) | ORAL | Status: DC
  Administered 2021-11-30 – 2021-12-02 (×5): 0.4 mg via ORAL
  Filled 2021-11-30 (×5): qty 1

## 2021-11-30 MED ORDER — DEXTROSE-NACL 5-0.9 % IV SOLN
INTRAVENOUS | Status: AC
Start: 2021-11-30 — End: 2021-12-01

## 2021-11-30 MED ORDER — FUROSEMIDE 10 MG/ML IJ SOLN
40.0000 mg | Freq: Once | INTRAMUSCULAR | Status: AC
  Administered 2021-11-30: 40 mg via INTRAVENOUS
  Filled 2021-11-30: qty 4

## 2021-11-30 NOTE — Progress Notes (Signed)
Patient took Medications with water, and slept majority of the night only waking when he needed to use the restroom in which he ambulated to toilet with walker and one assist. No complaints of pain. New Small bandage applied to the top of the left foot applied from a blister that has ruptured during day shift yesterday. Legs are continuing to weep and has small blister to right ankle.

## 2021-11-30 NOTE — Progress Notes (Addendum)
Bladder scan showed greater than 766m. In and out cath only produced 400 mL of clear yellow urine. Pt. Tolerated well. MD notified.

## 2021-11-30 NOTE — Progress Notes (Signed)
[facility] Progress Note   Assessment/ Plan:   # Cirrhosis with ascites - s/p paracentesis 11/28/21 with 3.4L off - on albumin challenge  - increasing midodrine to 20 mg TID and continuing ocreotide for possible HRS - on cefriaxone and azithro (likely covering CAP and possible SBP but paracentesis looks noninfected) - adding fluid restriction   # AKI/ possible HRS - Ischemic and pre-renal insults with hypotension.  He has a history of recent IV contrast exposure on 7/20 for a CT scan.  Possible HRS complicating whole picture - on albumin/ midodrine/ octreotide  - d/c Foley, bladder scan - will try 40 IV Lasix x 1 today to assess response     # Hypoglycemia - low rate of dextrose containing fluids per primary team.--> hopefully can stop soon    # Hyponatremia - Please switch to dextrose with normal saline if will be prolonged need for dextrose containing fluids   # Anemia - macrocytic  - setting of cirrhosis  - mild at this time   #CAP: - on azithro and ceftriaxone    Subjective:    BP is better but not at goal yet and Cr stabilized.  No complaints this AM   Objective:   BP 109/63 (BP Location: Right Arm)   Pulse (!) 59   Temp 98.1 F (36.7 C)   Resp 17   Ht '5\' 10"'$  (1.778 m)   Wt 85.2 kg   SpO2 93%   BMI 26.95 kg/m   Intake/Output Summary (Last 24 hours) at 11/30/2021 0955 Last data filed at 11/30/2021 0536 Gross per 24 hour  Intake 1277.89 ml  Output --  Net 1277.89 ml   Weight change: 1.012 kg  Physical Exam: Gen:NAD, pale CVS:RRR Resp:clear JKD:[id number] with + fluid wave Ext: 3+ edema to the flank  Imaging: US Paracentesis  Result Date: 11/28/2021 INDICATION: Ascites EXAM: ULTRASOUND GUIDED DIAGNOSTIC AND THERAPEUTIC PARACENTESIS MEDICATIONS: None. COMPLICATIONS: None immediate. PROCEDURE: Informed written consent was obtained from the patient after a discussion of the risks, benefits and alternatives to treatment. A timeout was  performed prior to the initiation of the procedure. Initial ultrasound scanning demonstrates a large amount of ascites within the right lower abdominal quadrant. The right lower abdomen was prepped and draped in the usual sterile fashion. 1% lidocaine was used for local anesthesia. Following this, a 19 gauge, 7-cm, Yueh catheter was introduced. An ultrasound image was saved for documentation purposes. The paracentesis was performed. The catheter was removed and a dressing was applied. The patient tolerated the procedure well without immediate post procedural complication. FINDINGS: A total of approximately 3.4 L of yellow ascitic fluid was removed. Samples were sent to the laboratory as requested by the clinical team. IMPRESSION: Successful ultrasound-guided paracentesis yielding 3.4 liters of peritoneal fluid. Electronically Signed   By: Lavonia Dana M.D.   On: 11/28/2021 14:25   [place] EKG SITE RITE  Result Date: 11/28/2021 If Site Rite image not attached, placement could not be confirmed due to current cardiac rhythm.   Labs: BMET Recent Labs  Lab 11/23/21 2203 11/27/21 1500 11/28/21 0447 11/29/21 0456 11/30/21 0502  NA 133* 131* 131* 131* 132*  K 4.1 3.8 3.6 3.0* 3.7  CL 103 99 102 101 101  CO2 22 21* 20* 20* 21*  GLUCOSE 134* 47* 55* 79 196*  BUN 36* 47* 48* 44* 44*  CREATININE 1.58* 2.64* 2.89* 2.79* 2.73*  CALCIUM 8.7* 8.4* 7.8* 8.0* 8.2*   CBC Recent Labs  Lab 11/23/21 2203 11/27/21  1500 11/28/21 1153 11/29/21 0456  WBC 8.8 8.4 5.6 3.7*  NEUTROABS  --  6.0  --   --   HGB 12.4* 11.6* 9.8* 8.5*  HCT 37.0* 34.0* 28.2* 25.7*  MCV 99.5 98.6 98.6 100.0  PLT 93* 97* 63* 54*    Medications:     aspirin  81 mg Oral Daily   brimonidine  1 drop Both Eyes BID   Chlorhexidine Gluconate Cloth  6 each Topical Daily   dextromethorphan-guaiFENesin  1 tablet Oral BID   heparin  5,000 Units Subcutaneous Q8H   lactulose  20 g Oral BID   midodrine  20 mg Oral TID WC   octreotide  100  mcg Subcutaneous TID   pantoprazole  40 mg Oral Daily   sodium chloride flush  3 mL Intravenous Q12H   sodium chloride flush  3 mL Intravenous Q12H   tamsulosin  0.4 mg Oral QPC supper   traZODone  100 mg Oral QHS    Madelon Lips, MD 11/30/2021, 9:55 AM

## 2021-11-30 NOTE — Progress Notes (Signed)
PROGRESS NOTE   Edward [name]  MNO:[id number] DOB: Feb 08, 1951 DOA: 11/27/2021 PCP: [name], FNP   Chief Complaint  Patient presents with   Hypotension   Level of care: Telemetry  Brief Admission History:   71 y.o. male with past medical history relevant for moderate thrombocytopenia, macrocytic anemia with superimposed iron deficiency, copper deficiency, CKD 3B, HLD, CAD, DM2 , liver cirrhosis and gallstones and HTN who presents to the ED with worsening fatigue, weakness, myalgias and malaise and increasing lower extremity and scrotal edema -Patient was discharged on 11/24/2019 after IV fluids for rhabdomyolysis -Additional history obtained from patient's ex wife and sister-in-law at bedside Pt was sent over from [facility] clinic due to increasing edema and soft blood pressure -Patient endorses dyspnea at rest and dyspnea on exertion but no frank chest pains, no leg pains or pleuritic symptoms -Troponin is 11 -UA is not suggestive of UTI -Lactic acid is 1.9 -Sodium 131, glucose 47 -Lipase is 151 patient has chronically elevated lipase -Total CK down to 367 -Creatinine is up to 2.64 creatinine was 1.58 on 11/23/2021 -LFTs continue to trend down since discharge, INR 1.4 which is close to patient's baseline -WBC 8.4 hemoglobin 11.6 which is close to prior baseline platelets 97 which is higher than prior baseline -Chest x-ray with possible pneumonia no CHF type findings   Assessment and Plan:  1)Volume Overload/Anasarca--- secondary to decompensated liver cirrhosis -Now presenting with significant lower extremity edema including scrotal and penile edema -Nephrology consult appreciated- -Midodrine increased to 20 mg 3 times daily, continue octreotide for possible HRS 11/30/21--- albumin and Lasix given -lasix on hold by nephrology team for now   2)AKI--acute kidney injury on CKD stage -3A    --Creatinine bumped on IV lasix which is being held now - consulted with nephrologist  - see recommendations - renally adjust medications, avoid nephrotoxic agents / dehydration  / hypotension - patient follows with Dr. Theador Hawthorne on outpatient basis  -Creatinine currently in the 2.7 range   3)Liver Cirrhosis with Ascites and Portal Hypertension---?Etiology -No significant alcohol use - s/p paracentesis 11/28/21 with 3.4L removed -Patient was previously on atorvastatin and copper supplements -Viral hepatitis profile was negative (negative for hep C and hep B may have had hep A infection in the past with antibodies noted) --Ultrasound from 11-25-21 reveals not enough fluid for paracentesis -Patient work-up by GI service included ANA, mitochondrial antibodies, anti-smooth antibodies, IgG IgA and IgM - -Pt would benefit from EGD and liver biopsy  (plan is for outpatient transjugular liver biopsy at [facility] ) -AST/ALT and alk phos normalizing, T. bili remains elevated   4)-cholelithiasis - CT abdomen and pelvis and right upper quadrant ultrasound consistent with gallstones and cholecystitis -Recent surgical consult appreciated -Recent GI consult appreciated HIDA scan with cystic duct patency noted- -EGD as outpatient to rule out varices    5)History of copper deficiency--- has been on copper replacement for over 1 year -Random Urine Copper high at 72 (09/12/21) -Hold copper tablets as copper toxicity can lead to hepatic dysfunction -Discussed with patient's hematologist Dr. Delton Coombes--- liver biopsy will be able to tell if patient has copper toxicity or not (outpatient transjugular liver biopsy at [facility] )   6) leukopenia with anemia and Thrombocytopenia--- with macrocytosis --diagnosed in May 2021 11/30/21-Hgb is down to 7.6, platelets are down to 48 -Worsening pancytopenia -- SPEP in June 2021 was normal.  Erythropoietin normal at 11.9. -Previously declined bone marrow biopsy for evaluation of possible early MDS -Previously treated with  copper supplements -Prior  work-up revealed somewhat low iron --- B12 and Folate were Not low -11/30/21  7)DM2---  A1c- 6.7 on 11/14/2021.[name]  Reflecting good diabetic control PTA -Patient having episodes of severe hypoglycemia,  -Change IV fluids to D5 normal saline due to hyponatremia Use Novolog/Humalog Sliding scale insulin with Accu-Cheks/Fingersticks as ordered   8)-Esophagitis--- recent CT chest shows -Patulous and thickened esophagus. -Protonix as ordered EGD as outpatient to be done as noted above #1   9)Social/ethics--- Discussed with patient's ex-wife and and sister-in-law at bedside -Patient is a full code   10)Recurrent severe Hypoglycemia----worsened by sulfonylurea use, impaired gluconeogenesis and impaired glucagon due to decompensated liver disease, due to poor oral intake in setting of underlying liver cirrhosis, I  -Insulin therapy discontinued -feed liberally and frequently  -Give Megace for appetite stimulation -Continue IV dextrose  11)CAP_-patient with some shortness of breath, chest x-ray suggestive of pneumonia -c/n ceftriaxone/azithromycin along with bronchodilators and mucolytics  12)HypoNatremia----Change IV fluids to D5 normal saline due to hyponatremia   Disposition/Need for in-Hospital Stay- patient unable to be discharged at this time due to -anasarca/volume overload, decompensated liver cirrhosis    DVT prophylaxis: SCDs Code Status: Full  Family Communication: none present Disposition: Home with home health versus SNF rehab  Status is: Inpatient Remains inpatient appropriate because: intensity    Consultants:  GI Nephrology   Procedures:  Paracentesis 7/25 ~3.4L ascitic fluid removed Antimicrobials:  Ceftriaxone/azithromycin    Subjective: -Voiding okay -Appetite remains poor  Objective: Vitals:   11/29/21 1341 11/29/21 2001 11/30/21 0500 11/30/21 1426  BP: 100/62 109/63  110/66  Pulse: (!) 56 (!) 59  60  Resp: 17 17  18   Temp: 98.2 F (36.8 C) 98.1 F (36.7  C)  (!) 97.5 F (36.4 C)  TempSrc:    Oral  SpO2: 99% 93%  91%  Weight:   85.2 kg   Height:        Intake/Output Summary (Last 24 hours) at 11/30/2021 1725 Last data filed at 11/30/2021 0536 Gross per 24 hour  Intake 1050.2 ml  Output --  Net 1050.2 ml   Filed Weights   11/28/21 0412 11/29/21 0425 11/30/21 0500  Weight: 86.7 kg 84.2 kg 85.2 kg   Examination:  Physical Examination: General appearance - alert,  in no distress, speaking in complete sentences Mental status - alert, oriented to person, place, and time,  Eyes - sclera anicteric Neck - supple, no JVD elevation , Chest -fair air movement bilaterally, no wheezing  heart - S1 and S2 normal, regular  Abdomen - soft, nontender, nondistended, +BS Neurological - screening mental status exam normal, neck supple without rigidity, cranial nerves II through XII intact, DTR's normal and symmetric Extremities -+3 pedal edema noted, intact peripheral pulses  Skin - warm, dry GU--scrotal  and penile edema  Data Reviewed: I have personally reviewed following labs and imaging studies  CBC: Recent Labs  Lab 11/23/21 2203 11/27/21 1500 11/28/21 1153 11/29/21 0456 11/30/21 0946  WBC 8.8 8.4 5.6 3.7* 3.2*  NEUTROABS  --  6.0  --   --   --   HGB 12.4* 11.6* 9.8* 8.5* 7.6*  HCT 37.0* 34.0* 28.2* 25.7* 22.3*  MCV 99.5 98.6 98.6 100.0 100.5*  PLT 93* 97* 63* 54* 48*    Basic Metabolic Panel: Recent Labs  Lab 11/23/21 2203 11/27/21 1500 11/28/21 0447 11/29/21 0456 11/30/21 0502  NA 133* 131* 131* 131* 132*  K 4.1 3.8 3.6 3.0* 3.7  CL 103 99 102  101 101  CO2 22 21* 20* 20* 21*  GLUCOSE 134* 47* 55* 79 196*  BUN 36* 47* 48* 44* 44*  CREATININE 1.58* 2.64* 2.89* 2.79* 2.73*  CALCIUM 8.7* 8.4* 7.8* 8.0* 8.2*  MG  --  2.5*  --  2.3  --    CBG: Recent Labs  Lab 11/30/21 0017 11/30/21 0402 11/30/21 0734 11/30/21 1122 11/30/21 1631  GLUCAP 245* 200* 212* 222* 243*    Recent Results (from the past 240 hour(s))   Gram stain     Status: None   Collection Time: 11/28/21  1:50 PM   Specimen: Peritoneal Cavity  Result Value Ref Range Status   Specimen Description PERITONEAL CAVITY  Final   Special Requests PERITONEAL CAVITY  Final   Gram Stain   Final    WBC PRESENT, PREDOMINANTLY MONONUCLEAR NO ORGANISMS SEEN CYTOSPIN SMEAR Performed at [facility], [address], [place], [place] [address]    Report Status 11/28/2021 FINAL  Final  Culture, body fluid w Gram Stain-bottle     Status: None (Preliminary result)   Collection Time: 11/28/21  1:50 PM   Specimen: Peritoneal Cavity  Result Value Ref Range Status   Specimen Description PERITONEAL CAVITY  Final   Special Requests   Final    BOTTLES DRAWN AEROBIC AND ANAEROBIC Blood Culture adequate volume 10cc   Culture   Final    NO GROWTH 2 DAYS Performed at [facility], [address], [place], [place] [address]    Report Status PENDING  Incomplete    Radiology Studies: No results found.  Scheduled Meds:  aspirin  81 mg Oral Daily   brimonidine  1 drop Both Eyes BID   dextromethorphan-guaiFENesin  1 tablet Oral BID   heparin  5,000 Units Subcutaneous Q8H   lactulose  20 g Oral BID   midodrine  20 mg Oral TID WC   octreotide  100 mcg Subcutaneous TID   pantoprazole  40 mg Oral Daily   sodium chloride flush  3 mL Intravenous Q12H   sodium chloride flush  3 mL Intravenous Q12H   tamsulosin  0.4 mg Oral QPC supper   traZODone  100 mg Oral QHS   Continuous Infusions:  sodium chloride     albumin human 50 g (11/30/21 0904)   azithromycin 500 mg (11/30/21 0040)   cefTRIAXone (ROCEPHIN)  IV 1 g (11/29/21 2337)   dextrose 5 % and 0.9% NaCl      LOS: 3 days   Roxan Hockey, MD How to contact the [facility] Attending or Consulting provider [place] or covering provider during after hours [place], for this patient?  Check the care team in [facility] and look for a) attending/consulting [facility] provider listed and b) the [facility] team listed Log into  [email or web address] and use [place]'s universal password to access. If you do not have the password, please contact the hospital operator. Locate the [facility] provider you are looking for under [facility] and page to a number that you can be directly reached. If you still have difficulty reaching the provider, please page the [facility] (Director on Call) for the Hospitalists listed on amion for assistance.  11/30/2021, 5:25 PM

## 2021-11-30 NOTE — Consult Note (Signed)
IR received request for liver biopsy from Edward [name], [place] for gastroenterology at [facility]. After review of patient's case, specifically that the patient has cirrhosis with ascites and an elevated INR, an outpatient transjugular liver biopsy at [facility] was felt by Dr Laurence Ferrari to be the best option for this patient. Spoke with [name] regarding this decision, and she stated she will place an order for this procedure.  Spoke with patient to discuss this plan, and he was amenable to proceed with plan as described above. Once outpatient order is received, patient will hear from IR scheduler regarding the time and date of his appointment. He is agreeable to this.

## 2021-11-30 NOTE — Inpatient Diabetes Management (Signed)
Inpatient Diabetes Program Recommendations  AACE/ADA: New Consensus Statement on Inpatient Glycemic Control  Target Ranges:  Prepandial:   less than 140 mg/dL      Peak postprandial:   less than 180 mg/dL (1-2 hours)      Critically ill patients:  140 - 180 mg/dL    Latest Reference Range & Units 11/30/21 00:17 11/30/21 04:02 11/30/21 07:34  Glucose-Capillary 70 - 99 mg/dL 245 (H) 200 (H) 212 (H)    Latest Reference Range & Units 11/29/21 00:35 11/29/21 04:27 11/29/21 07:07 11/29/21 11:06 11/29/21 17:30 11/29/21 20:03  Glucose-Capillary 70 - 99 mg/dL 86 104 (H) 72 95 280 (H) 266 (H)    Latest Reference Range & Units 11/27/21 15:00  Hemoglobin A1C 4.8 - 5.6 % 6.5 (H)   Review of Glycemic Control  Diabetes history: DM2 Outpatient Diabetes medications: Glipizide 10 mg QAM, Glipiizide 5 mg QPM Current orders for Inpatient glycemic control: None; CBGs  Inpatient Diabetes Program Recommendations:    Insulin: Please consider ordering Novolog 0-9 units AC&HS.  Thanks, Barnie Alderman, RN, MSN, [place] Diabetes Coordinator Inpatient Diabetes Program [phone] (Team Pager from 8am to [place])

## 2021-11-30 NOTE — Progress Notes (Signed)
Gastroenterology Progress Note   Referring Provider: No ref. provider found Primary Care Physician:  [name], [place] Primary Gastroenterologist:  Dr. Elisha Ponder  Patient ID: Edward [name] [facility]; [id number]; [birth year]   Subjective:    Feels tired. Alert and oriented X 4. States he is not urinating much. +BM. Feels like genital edema improved. No abdominal pain. No n/v. No melena, brbpr.   Objective:   Vital signs in last 24 hours: Temp:  [98.1 F (36.7 C)-98.2 F (36.8 C)] 98.1 F (36.7 C) (07/26 2001) Pulse Rate:  [56-59] 59 (07/26 2001) Resp:  [17] 17 (07/26 2001) BP: (100-109)/(62-63) 109/63 (07/26 2001) SpO2:  [93 %-99 %] 93 % (07/26 2001) Weight:  [85.2 kg] 85.2 kg (07/27 0500) Last BM Date : 11/28/21 General:   Alert,  appears older than stated age. Pleasant and cooperative in NAD Head:  Normocephalic and atraumatic. Eyes:  Sclera clear, no icterus.  Chest: CTA bilaterally without rales, rhonchi, crackles.    Heart:  Regular rate and rhythm; no murmurs, clicks, rubs,  or gallops. Abdomen:  Soft, nontender. Mild abdominal distention but not tense.  Normal bowel sounds, without guarding, and without rebound.   Extremities:  left foot with dry dressing. Bilateral 3-4+ pedal edema. 2+ edema to the thighs bilaterally.  Neurologic:  Alert and oriented x4;  grossly normal neurologically. No asterixis. Skin:  no rashes. Psych:  Alert and cooperative. Normal mood and affect.  Intake/Output from previous day: 07/26 0701 - 07/27 0700 In: 1938.7 [P.O.:390; I.V.:647.9; IV Piggyback:900.8] Out: -  Intake/Output this shift: No intake/output data recorded.  Lab Results: CBC Recent Labs    11/27/21 1500 11/28/21 1153 11/29/21 0456  WBC 8.4 5.6 3.7*  HGB 11.6* 9.8* 8.5*  HCT 34.0* 28.2* 25.7*  MCV 98.6 98.6 100.0  PLT 97* 63* 54*   BMET Recent Labs    11/28/21 0447 11/29/21 0456 11/30/21 0502  NA 131* 131* 132*  K 3.6 3.0* 3.7  CL 102 101 101  CO2 20* 20* 21*   GLUCOSE 55* 79 196*  BUN 48* 44* 44*  CREATININE 2.89* 2.79* 2.73*  CALCIUM 7.8* 8.0* 8.2*   LFTs Recent Labs    11/28/21 1153 11/29/21 0456 11/30/21 0502  BILITOT 1.9* 1.9* 2.6*  BILIDIR 0.6*  --   --   IBILI 1.3*  --   --   ALKPHOS 53 39 30*  AST 91* 62* 47*  ALT 84* 57* 40  PROT 4.9* 5.2* 5.6*  ALBUMIN 2.1* 3.1* 4.1   Recent Labs    11/27/21 1500  LIPASE 151*   PT/INR Recent Labs    11/27/21 1500 11/28/21 1153 11/29/21 1251  LABPROT 16.7* 17.9* 20.6*  INR 1.4* 1.5* 1.8*   Imaging Studies: US Paracentesis  Result Date: 11/28/2021 INDICATION: Ascites EXAM: ULTRASOUND GUIDED DIAGNOSTIC AND THERAPEUTIC PARACENTESIS MEDICATIONS: None. COMPLICATIONS: None immediate. PROCEDURE: Informed written consent was obtained from the patient after a discussion of the risks, benefits and alternatives to treatment. A timeout was performed prior to the initiation of the procedure. Initial ultrasound scanning demonstrates a large amount of ascites within the right lower abdominal quadrant. The right lower abdomen was prepped and draped in the usual sterile fashion. 1% lidocaine was used for local anesthesia. Following this, a 19 gauge, 7-cm, Yueh catheter was introduced. An ultrasound image was saved for documentation purposes. The paracentesis was performed. The catheter was removed and a dressing was applied. The patient tolerated the procedure well without immediate post procedural complication. FINDINGS: A total of  approximately 3.4 L of yellow ascitic fluid was removed. Samples were sent to the laboratory as requested by the clinical team. IMPRESSION: Successful ultrasound-guided paracentesis yielding 3.4 liters of peritoneal fluid. Electronically Signed   By: Lavonia Dana M.D.   On: 11/28/2021 14:25   [place] EKG SITE RITE  Result Date: 11/28/2021 If Site Rite image not attached, placement could not be confirmed due to current cardiac rhythm.  DG Chest Port 1 View  Result Date:  11/27/2021 CLINICAL DATA:  Shortness of breath EXAM: PORTABLE CHEST 1 VIEW COMPARISON:  11/21/2021 FINDINGS: Cardiac size is within normal limits. There are no signs of pulmonary edema. Linear densities are seen in both lower lung fields with slight improvement. No new focal infiltrates are seen. There is no pleural effusion or pneumothorax. IMPRESSION: Linear densities are seen in the lower lung fields suggesting atelectasis/pneumonia. There is interval improvement in the aeration of both lower lung fields. There are no new infiltrates or signs of pulmonary edema. Electronically Signed   By: Elmer Picker M.D.   On: 11/27/2021 15:57   CT ABDOMEN PELVIS W CONTRAST  Result Date: 11/24/2021 CLINICAL DATA:  Bowel obstruction suspected. Abdominal distension. Hypotension. EXAM: CT ABDOMEN AND PELVIS WITH CONTRAST TECHNIQUE: Multidetector CT imaging of the abdomen and pelvis was performed using the standard protocol following bolus administration of intravenous contrast. RADIATION DOSE REDUCTION: This exam was performed according to the departmental dose-optimization program which includes automated exposure control, adjustment of the mA and/or kV according to patient size and/or use of iterative reconstruction technique. CONTRAST:  145m OMNIPAQUE IOHEXOL 300 MG/ML  SOLN COMPARISON:  CT 6 days ago 11/08/2021 FINDINGS: Lower chest: Small left pleural effusion and adjacent atelectasis, new from prior exam. Trace right pleural effusion. Heart is normal in size. There are paraesophageal varices. Hepatobiliary: Cirrhotic hepatic morphology. No focal hepatic lesion. Calcified gallstones. No obvious gallbladder wall thickening. There is no biliary dilatation. Pancreas: Parenchymal atrophy. No ductal dilatation or inflammation. Spleen: Again seen splenomegaly, greatest splenic dimension 13.5 cm. No focal splenic abnormality. Adrenals/Urinary Tract: No adrenal nodule. Mild bilateral renal atrophy. No hydronephrosis or  focal renal abnormality. No renal calculi. Urinary bladder is minimally distended. Stomach/Bowel: Paraesophageal and perigastric varices. Assessment for bowel wall thickening is limited due to the presence of abdominal ascites. There is no abnormal bowel distension or obstruction. Normal appendix. Mild low-density wall thickening of the cecum and ascending colon. Left colonic diverticulosis, no focal diverticulitis. Vascular/Lymphatic: Aortic atherosclerosis without aneurysm. The portal vein is patent. Splenic vein is patent. Portosystemic collaterals noted in the abdomen. Paraesophageal and perigastric varices. No bulky abdominopelvic adenopathy. Reproductive: Prostate is unremarkable. Other: Moderate volume ascites with increase from prior exam. No free air. There is generalized body wall edema that has increased. No abdominal wall hernia. Musculoskeletal: There are no acute or suspicious osseous abnormalities. IMPRESSION: 1. No bowel obstruction. Mild low-density wall thickening of the cecum and ascending colon, may be due to portal colopathy. 2. Cirrhosis with portal hypertension and splenomegaly. Paraesophageal and perigastric varices. 3. Moderate volume ascites has increased. Generalized body wall edema has increased. Small left pleural effusion and adjacent atelectasis, new from prior exam. Trace right pleural effusion. 4. Cholelithiasis without CT findings of acute cholecystitis. 5. Colonic diverticulosis without focal diverticulitis. Aortic Atherosclerosis (ICD10-I70.0). Electronically Signed   By: MKeith RakeM.D.   On: 11/24/2021 00:49   DG Chest 2 View  Result Date: 11/21/2021 CLINICAL DATA:  Shortness of breath and weakness. EXAM: CHEST - 2 VIEW COMPARISON:  Chest  two views 09/14/2013, CT chest 11/18/2021 FINDINGS: Cardiac and mediastinal contours are within limits. Interval decrease in bilateral lung volumes compared to 09/14/2013 radiographs, however this is likely similar to 11/18/2021 CT.  There are bibasilar opacities including horizontal linear septal thickening that was seen on prior CT. Minimal blunting of the bilateral costophrenic angles raises the question of tiny bilateral pleural effusions, however none were seen on recent CT and this may be secondary to the peripheral inferior pulmonary fibrosis. No pneumothorax. Moderate multilevel degenerative disc changes of the midthoracic spine. IMPRESSION: Decreased lung volumes with bibasilar interlobular septal thickening corresponding to the mild interstitial pulmonary fibrosis seen on recent CT. Minimal blunting of the bilateral costophrenic angles raises the question of tiny bilateral pleural effusions, however as no pleural effusions were seen on recent CT, this may reflect the chronic interstitial scarring. Electronically Signed   By: Yvonne Kendall M.D.   On: 11/21/2021 12:58   [place] ASCITES (ABDOMEN LIMITED)  Result Date: 11/21/2021 CLINICAL DATA:  Elevated liver function tests.  Ascites. EXAM: LIMITED ABDOMEN ULTRASOUND FOR ASCITES TECHNIQUE: Limited ultrasound survey for ascites was performed in all four abdominal quadrants. COMPARISON:  11/18/2021 FINDINGS: Small amount of ascites is present in this patient with morphologic findings of cirrhosis. The amount of ascites is currently felt to be below the volume threshold at which paracentesis would yield a significant therapeutic benefit. IMPRESSION: 1. Small amount of scattered ascites in the abdomen.  Cirrhosis. Electronically Signed   By: Van Clines M.D.   On: 11/21/2021 12:05   NM Hepato W/EF  Result Date: 11/20/2021 CLINICAL DATA:  Right upper quadrant abdominal pain x1 month, nondiagnostic ultrasound. EXAM: NUCLEAR MEDICINE HEPATOBILIARY IMAGING WITH GALLBLADDER EF TECHNIQUE: Sequential images of the abdomen were obtained out to 60 minutes following intravenous administration of radiopharmaceutical. After slow intravenous infusion of 1.5 micrograms Cholecystokinin,  gallbladder ejection fraction was determined. RADIOPHARMACEUTICALS:  5.5 mCi Tc-36mCholetec IV COMPARISON:  Ultrasound November 18, 2021 FINDINGS: Delayed blood pool activity with delayed clearance of radiotracer from the liver. Prompt uptake and biliary excretion of activity by the liver is seen. Gallbladder activity is visualized, consistent with patency of cystic duct. Biliary activity passes into small bowel, consistent with patent common bile duct. There is an initial drop in radiotracer activity post CCK administration however there is a subsequent increase in activity towards the end of scintigraphic sequences. In the setting of delayed clearance of radiotracer from the liver. Calculated gallbladder ejection fraction of -56% (at 60 min, normal ejection fraction is greater than 40%.) IMPRESSION: 1. Delayed clearance of radiotracer from blood pool and the liver suggestive of hepatitis, which limits the utility of the study. 2. Reduced gallbladder ejection fraction, which is at least somewhat related to the delayed radiotracer clearance from the liver but is at least suggestive of chronic cholecystitis/biliary dyskinesia. Electronically Signed   By: JDahlia BailiffM.D.   On: 11/20/2021 13:06   [place] Limited RUQ (LIVER/GB)  Result Date: 11/18/2021 CLINICAL DATA:  Abnormal LFTs. EXAM: ULTRASOUND ABDOMEN LIMITED RIGHT UPPER QUADRANT COMPARISON:  CT of the abdomen and pelvis 11/18/2021 FINDINGS: Gallbladder: Gallbladder wall is thickened measuring 5.3 mm. No sonographic MP[place]ell Mill[place]ign is described. Shadowing gallstones measure up to 11 mm. Common bile duct: Diameter: 3.0 mm, within normal limits. Liver: Liver is shrunken within nodular appearance. Heterogeneous increased echogenicity noted. No discrete lesions are present. Portal vein is patent on color Doppler imaging with normal direction of blood flow towards the liver. Other: Moderate abdominal ascites is present.  IMPRESSION: 1. Gallbladder wall thickening  with cholelithiasis suggesting acute cholecystitis. 2. Findings suggestive of hepatic cirrhosis. 3. Ascites. Electronically Signed   By: San Morelle M.D.   On: 11/18/2021 12:01   CT CHEST ABDOMEN PELVIS WO CONTRAST  Result Date: 11/18/2021 CLINICAL DATA:  71 year old male history of weakness. Metastatic disease. EXAM: CT CHEST, ABDOMEN AND PELVIS WITHOUT CONTRAST TECHNIQUE: Multidetector CT imaging of the chest, abdomen and pelvis was performed following the standard protocol without IV contrast. RADIATION DOSE REDUCTION: This exam was performed according to the departmental dose-optimization program which includes automated exposure control, adjustment of the mA and/or kV according to patient size and/or use of iterative reconstruction technique. COMPARISON:  No priors. FINDINGS: CT CHEST FINDINGS Cardiovascular: Heart size is normal. There is no significant pericardial fluid, thickening or pericardial calcification. There is aortic atherosclerosis, as well as atherosclerosis of the great vessels of the mediastinum and the coronary arteries, including calcified atherosclerotic plaque in the left main, left anterior descending, left circumflex and right coronary arteries. No pathologically enlarged lymph nodes. Mediastinum/Nodes: No pathologically enlarged mediastinal or hilar lymph nodes. Please note that accurate exclusion of hilar adenopathy is limited on noncontrast CT scans. Esophagus is mildly patulous and thickened without a discrete esophageal mass confidently identified. No axillary lymphadenopathy. Lungs/Pleura: 3 mm right upper lobe pulmonary nodule (axial image 79 of series 5). 5 mm right lower lobe pulmonary nodule (axial image 91 of series 5). No larger more suspicious appearing pulmonary nodules or masses are noted. No acute consolidative airspace disease. No pleural effusions. Patchy areas of peripheral predominant ground-glass attenuation, septal thickening and mild subpleural  reticulation are noted throughout the mid to lower lungs bilaterally. No honeycombing. Musculoskeletal: There are no aggressive appearing lytic or blastic lesions noted in the visualized portions of the skeleton. CT ABDOMEN PELVIS FINDINGS Hepatobiliary: Liver has a shrunken appearance and nodular contour, indicative of underlying cirrhosis. No definite discrete cystic or solid hepatic lesions are confidently identified on today's noncontrast CT examination. Calcified gallstones lying dependently in the gallbladder. Gallbladder is moderately distended. Accurate assessment for pericholecystic fluid and inflammation is limited by presence of perihepatic ascites. Pancreas: No definite pancreatic mass. Haziness in the peripancreatic fat. No well-defined peripancreatic fluid collections. Spleen: Spleen is enlarged measuring 14.8 x 9.0 x 14.4 cm (estimated splenic volume of 959 mL) . Adrenals/Urinary Tract: Unenhanced appearance of the kidneys and bilateral adrenal glands is normal. No hydroureteronephrosis. Urinary bladder is unremarkable in appearance. Stomach/Bowel: Unenhanced appearance of the stomach is unremarkable. No pathologic dilatation of small bowel or colon. Numerous colonic diverticulae are noted, particularly in the sigmoid colon. No definite focal surrounding inflammatory changes to clearly indicate acute diverticulitis are noted at this time (assessment is limited by presence of small volume of ascites). Appendix is not confidently identified. Vascular/Lymphatic: Atherosclerotic calcifications throughout the abdominal aorta and pelvic vasculature. Portal vein is dilated measuring 16 mm in the porta hepatis. No definite lymphadenopathy noted in the abdomen or pelvis on today's noncontrast CT examination. Reproductive: Prostate gland and seminal vesicles are unremarkable in appearance. Other: Small volume of ascites.  No pneumoperitoneum. Musculoskeletal: There are no aggressive appearing lytic or blastic  lesions noted in the visualized portions of the skeleton. IMPRESSION: 1. Cirrhosis with small volume of ascites. Dilated portal vein and splenomegaly indicative of portal hypertension. 2. Small pulmonary nodules measuring 5 mm or less in size in the right lung, nonspecific, but statistically likely benign. No follow-up needed if patient is low-risk (and has no known or suspected primary  neoplasm). Non-contrast chest CT can be considered in 12 months if patient is high-risk. This recommendation follows the consensus statement: Guidelines for Management of Incidental Pulmonary Nodules Detected on CT Images: From the Fleischner Society 2017; Radiology 2017; 284:228-243. 3. No other definite signs of potential metastatic disease noted elsewhere in the chest, abdomen or pelvis on today's noncontrast CT examination. 4. Esophagus appears mildly patulous and diffusely thickened without discrete esophageal mass. Further evaluation with nonemergent endoscopy should be considered to better evaluate these findings in the near future. 5. Cholelithiasis without evidence of acute cholecystitis at this time. 6. Aortic atherosclerosis, in addition to left main and three-vessel coronary artery disease. Please note that although the presence of coronary artery calcium documents the presence of coronary artery disease, the severity of this disease and any potential stenosis cannot be assessed on this non-gated CT examination. Assessment for potential risk factor modification, dietary therapy or pharmacologic therapy may be warranted, if clinically indicated. 7. Additional incidental findings, as above. Electronically Signed   By: Vinnie Langton M.D.   On: 11/18/2021 09:35   CT L-SPINE NO CHARGE  Result Date: 11/18/2021 CLINICAL DATA:  71 year old male being evaluated for metastatic disease. Weakness. Unable to walk. EXAM: CT LUMBAR SPINE WITHOUT CONTRAST TECHNIQUE: Technique: Multiplanar CT images of the lumbar spine were  reconstructed from contemporary CT of the Abdomen and Pelvis. RADIATION DOSE REDUCTION: This exam was performed according to the departmental dose-optimization program which includes automated exposure control, adjustment of the mA and/or kV according to patient size and/or use of iterative reconstruction technique. CONTRAST:  None COMPARISON:  Thoracic spine CT, CT Chest, Abdomen, and Pelvis today reported separately. FINDINGS: Segmentation: Normal, concordant with the thoracic spine numbering today. Alignment: Normal lumbar lordosis. No spondylolisthesis. Minimal levoconvex lumbar scoliosis. Vertebrae: Bone mineralization is within normal limits for age. No lumbar vertebral fracture or lesion identified. Visible sacrum and SI joints appear intact. Paraspinal and other soft tissues: Abdominal and pelvic viscera are detailed separately. Lumbar paraspinal soft tissues are within normal limits. Disc levels: Age-appropriate lumbar spine degeneration. Disc bulging and endplate spurring with mild if any associated lumbar spinal stenosis. IMPRESSION: 1. Age-appropriate noncontrast CT appearance of the Lumbar Spine. 2.  CT Chest, Abdomen, and Pelvis today are reported separately. Electronically Signed   By: Genevie Ann M.D.   On: 11/18/2021 09:34   CT T-SPINE NO CHARGE  Result Date: 11/18/2021 CLINICAL DATA:  71 year old male being evaluated for metastatic disease. Weakness. Unable to walk. EXAM: CT THORACIC SPINE WITHOUT CONTRAST TECHNIQUE: Multiplanar CT images of the thoracic spine were reconstructed from contemporary CT of the Chest. RADIATION DOSE REDUCTION: This exam was performed according to the departmental dose-optimization program which includes automated exposure control, adjustment of the mA and/or kV according to patient size and/or use of iterative reconstruction technique. CONTRAST:  None COMPARISON:  CT cervical spine, CT Chest, Abdomen, and Pelvis today reported separately. FINDINGS: Limited cervical  spine imaging:  Reported separately today. Thoracic spine segmentation:  Normal. Alignment: Normal for age thoracic kyphosis. No spondylolisthesis. No scoliosis. Vertebrae: Bone mineralization is within normal limits for age. No thoracic vertebral lesion by CT. No thoracic vertebral compression or fracture identified. Visible posterior ribs appear intact. Paraspinal and other soft tissues: Chest and abdominal viscera are reported separately. Thoracic paraspinal soft tissues are within normal limits. Disc levels: Age-appropriate thoracic spine degeneration. No CT evidence of thoracic spinal stenosis. Unremarkable noncontrast appearance of the thoracic spinal canal. IMPRESSION: 1. Normal for age noncontrast CT appearance of the  Thoracic Spine. 2.  CT Chest, Abdomen, and Pelvis today are reported separately. Electronically Signed   By: Genevie Ann M.D.   On: 11/18/2021 09:30   CT HEAD WO CONTRAST (5MM)  Result Date: 11/18/2021 CLINICAL DATA:  71 year old male being evaluated for metastatic disease. Weakness. Unable to walk. EXAM: CT HEAD WITHOUT CONTRAST TECHNIQUE: Contiguous axial images were obtained from the base of the skull through the vertex without intravenous contrast. RADIATION DOSE REDUCTION: This exam was performed according to the departmental dose-optimization program which includes automated exposure control, adjustment of the mA and/or kV according to patient size and/or use of iterative reconstruction technique. COMPARISON:  None Available. FINDINGS: Brain: Fairly normal for age cerebral volume. No midline shift, ventriculomegaly, mass effect, evidence of mass lesion, intracranial hemorrhage or evidence of cortically based acute infarction. Gray-white matter differentiation is within normal limits throughout the brain. No encephalomalacia or cerebral edema identified. Vascular: Calcified atherosclerosis at the skull base. Skull: No acute or suspicious bone lesion identified. Sinuses/Orbits: Visualized  paranasal sinuses and mastoids are well aerated. Trace bilateral mastoid air cell fluid. Small mucous retention cyst right maxillary sinus. Other: Negative visible nasopharynx. No suspicious orbit or scalp soft tissue finding. IMPRESSION: Negative for age noncontrast Head CT. Note that early metastatic disease to the brain is not excluded in the absence of intravenous contrast. Electronically Signed   By: Genevie Ann M.D.   On: 11/18/2021 09:26   CT CERVICAL SPINE WO CONTRAST  Result Date: 11/18/2021 CLINICAL DATA:  71 year old male being evaluated for metastatic disease. Weakness. Unable to walk. EXAM: CT CERVICAL SPINE WITHOUT CONTRAST TECHNIQUE: Multidetector CT imaging of the cervical spine was performed without intravenous contrast. Multiplanar CT image reconstructions were also generated. RADIATION DOSE REDUCTION: This exam was performed according to the departmental dose-optimization program which includes automated exposure control, adjustment of the mA and/or kV according to patient size and/or use of iterative reconstruction technique. COMPARISON:  Head CT reported separately. FINDINGS: Alignment: Mild straightening of cervical lordosis. Cervicothoracic junction alignment is within normal limits. Bilateral posterior element alignment is within normal limits. Skull base and vertebrae: Bone mineralization is within normal limits for age. Visualized skull base is intact. No atlanto-occipital dissociation. No acute or suspicious osseous lesion identified in the cervical spine. Soft tissues and spinal canal: No prevertebral fluid or swelling. No visible canal hematoma. Negative noncontrast visible neck soft tissues except for calcified carotid atherosclerosis. Disc levels: Ordinary lower cervical disc and endplate degeneration, bulky at C6-C7, eccentric to the left, and associated with up to mild spinal stenosis at that level. But no other convincing cervical spinal stenosis by CT. There is associated moderate  to severe left C7 proximal foraminal stenosis. Upper chest: Chest CT, thoracic spine CT reported separately. IMPRESSION: 1. No acute or metastatic process identified in the Cervical Spine by noncontrast CT. 2. Ordinary cervical spine degeneration at C6-C7 with suspected mild spinal stenosis and moderate to severe left C7 foraminal stenosis. Electronically Signed   By: Genevie Ann M.D.   On: 11/18/2021 09:24  [2 weeks]  Assessment:   Jensyn Cambria is a 71 year old male  with pmh significant for moderate thrombocytopenia, macrocytic anemia, copper deficiency following with hematology, CKD, HLD, CAD, DM 2,HTN, cirrhosis complicated by ascites and portal hypertension with paraesophageal and perigastric varices per imaging, gallstones, recent admission and discharge on 7/19 with rhabdomyolysis and also found to have new diagnosis of cirrhosis as well as elevated LFTs with negative work-up while inpatient though Wilsons disease has  been in question.  He presented to the emergency room 7/24 with worsening fatigue, weakness, increasing lower extremity edema and abdominal distention, found to have significant anasarca on exam, labs with AKI, chest x-ray with possible atelectasis/pneumonia.  He was admitted, GI and nephrology consulted to assist with management.  Decompensated cirrhosis: etiology of recently diagnosed cirrhosis remains unclear but question of Wilson's given low ceruloplasmin, low serum copper (previously on copper supplementation), elevated 24 hour urine copper, needs ophthalmology evaluation to rule out KF rings. Liver biopsy to determine hepatic copper level could be helpful as well. Molecular genetic testing for mutations in ATP7B is available but 2-3 week turnaround. Not typical Wilson's patient at age 34 presenting with no prior complications of Wilson's.   MELD Na 26 on admission. MELD Na 30 today. INR is up in setting of heparin (theoretically should not affect but has been seen depending on  testing modality/reagent selectivity). Overall total bilirubin up but slight improvement in sodium and creatinine. Accuracy of determining degree of hepatic decompensation in this clinical scenario is questionable.     Anasarca is stable to slightly improved. Nephrology adjusting diuretics. Weight is up 2 pounds in last 24 hours. Will continue daily weights. Encouraged strict I/Os.  Mild hepatic encephalopathy, started on lactulose.  Four BMs yesterday. No asterixis today. AXO X 4. Ongoing fatigue.   Elevated LFTS: continued improvement in transaminases with slight bump in total bilirubin today.   AKI: slight improvement in creatinine today. Urine output last 24 hours, unmeasured output X 4. Discussed with Dr. Hollie Salk (nephrology). Likely multifactorial. IV laxis '40mg'$  IV once today. Increasing midodrine and continuing octreotide for possible HRS.  Anemia: chronic, baseline in 11-12 range. Follows with hematology. No overt GI bleeding.   Plan:   Follow up urine studies, to be collected. Continue midodrine, octreotide, diuretics per nephrology.  Continue daily MELD. Continue lactulose. Liver biopsy and/or molecular genetic testing for mutations in ATP7B could be considered. I have discussed potential liver biopsy with Jannifer Franklin, PA-C with IR. Limitations at this time are INR of 2.1 and thrombocytopenia. Formal consultation requested. If liver biopsy possible, would consider holding heparin and possible FFP if ok from renal standpoint.   EGD as outpatient for variceal screening. Suggested varices noted on imaging.  Outpatient ophthalmology exam for KF rings. May rquire liver biopsy.    LOS: 3 days   Laureen Ochs. Bernarda Caffey [facility] [phone] 7/27/20237:53 AM

## 2021-12-01 ENCOUNTER — Inpatient Hospital Stay: Payer: 59 | Admitting: Nurse Practitioner

## 2021-12-01 ENCOUNTER — Ambulatory Visit (HOSPITAL_COMMUNITY): Payer: 59 | Admitting: Physician Assistant

## 2021-12-01 DIAGNOSIS — R601 Generalized edema: Secondary | ICD-10-CM | POA: Diagnosis not present

## 2021-12-01 DIAGNOSIS — N179 Acute kidney failure, unspecified: Secondary | ICD-10-CM

## 2021-12-01 DIAGNOSIS — N1832 Chronic kidney disease, stage 3b: Secondary | ICD-10-CM

## 2021-12-01 DIAGNOSIS — K7460 Unspecified cirrhosis of liver: Secondary | ICD-10-CM | POA: Diagnosis not present

## 2021-12-01 DIAGNOSIS — R188 Other ascites: Secondary | ICD-10-CM | POA: Diagnosis not present

## 2021-12-01 LAB — CBC
HCT: 24.2 % — ABNORMAL LOW (ref 39.0–52.0)
Hemoglobin: 7.9 g/dL — ABNORMAL LOW (ref 13.0–17.0)
MCH: 33.8 pg (ref 26.0–34.0)
MCHC: 32.6 g/dL (ref 30.0–36.0)
MCV: 103.4 fL — ABNORMAL HIGH (ref 80.0–100.0)
Platelets: 46 10*3/uL — ABNORMAL LOW (ref 150–400)
RBC: 2.34 MIL/uL — ABNORMAL LOW (ref 4.22–5.81)
RDW: 16 % — ABNORMAL HIGH (ref 11.5–15.5)
WBC: 4.9 10*3/uL (ref 4.0–10.5)
nRBC: 0 % (ref 0.0–0.2)

## 2021-12-01 LAB — COMPREHENSIVE METABOLIC PANEL
ALT: 33 U/L (ref 0–44)
AST: 33 U/L (ref 15–41)
Albumin: 4.7 g/dL (ref 3.5–5.0)
Alkaline Phosphatase: 26 U/L — ABNORMAL LOW (ref 38–126)
Anion gap: 12 (ref 5–15)
BUN: 48 mg/dL — ABNORMAL HIGH (ref 8–23)
CO2: 20 mmol/L — ABNORMAL LOW (ref 22–32)
Calcium: 8.5 mg/dL — ABNORMAL LOW (ref 8.9–10.3)
Chloride: 100 mmol/L (ref 98–111)
Creatinine, Ser: 2.77 mg/dL — ABNORMAL HIGH (ref 0.61–1.24)
GFR, Estimated: 24 mL/min — ABNORMAL LOW (ref >60)
Glucose, Bld: 168 mg/dL — ABNORMAL HIGH (ref 70–99)
Potassium: 3.2 mmol/L — ABNORMAL LOW (ref 3.5–5.1)
Sodium: 132 mmol/L — ABNORMAL LOW (ref 135–145)
Total Bilirubin: 3.3 mg/dL — ABNORMAL HIGH (ref 0.3–1.2)
Total Protein: 6.1 g/dL — ABNORMAL LOW (ref 6.5–8.1)

## 2021-12-01 LAB — PROTIME-INR
INR: 2.2 — ABNORMAL HIGH (ref 0.8–1.2)
Prothrombin Time: 24.4 seconds — ABNORMAL HIGH (ref 11.4–15.2)

## 2021-12-01 LAB — GLUCOSE, CAPILLARY
Glucose-Capillary: 161 mg/dL — ABNORMAL HIGH (ref 70–99)
Glucose-Capillary: 172 mg/dL — ABNORMAL HIGH (ref 70–99)
Glucose-Capillary: 216 mg/dL — ABNORMAL HIGH (ref 70–99)
Glucose-Capillary: 191 mg/dL — ABNORMAL HIGH (ref 70–99)
Glucose-Capillary: 200 mg/dL — ABNORMAL HIGH (ref 70–99)

## 2021-12-01 LAB — CYTOLOGY - NON PAP

## 2021-12-01 MED ORDER — FUROSEMIDE 10 MG/ML IJ SOLN
40.0000 mg | Freq: Two times a day (BID) | INTRAMUSCULAR | Status: DC
  Administered 2021-12-01 – 2021-12-02 (×4): 40 mg via INTRAVENOUS
  Filled 2021-12-01 (×4): qty 4

## 2021-12-01 MED ORDER — POTASSIUM CHLORIDE 20 MEQ PO PACK
40.0000 meq | PACK | Freq: Once | ORAL | Status: AC
  Administered 2021-12-01: 40 meq via ORAL
  Filled 2021-12-01: qty 2

## 2021-12-01 NOTE — Progress Notes (Signed)
12/01/21---  -Urine output remains poor despite IV Lasix -Bladder scan appears to be over estimating bladder urine volume due to patient also having concomitant ascites  Roxan Hockey, MD

## 2021-12-01 NOTE — Progress Notes (Signed)
Gastroenterology Progress Note   Referring Provider: No ref. provider found Primary Care Physician:  [name], [place] Primary Gastroenterologist:  Dr. Abbey Chatters  Patient ID: Etai Copado [facility]; [id number]; 11/07/1950    Subjective   Patient reports a lack of appetite today.  He continues to feel tired but he is alert and oriented x4.  He states he is urinating a little bit more today but still not that much.  He has had 1 bowel movement this morning and feels like he needs to go again at the time of my exam.  He continues to feel like his edema is improving.  He denies any abdominal pain, nausea or vomiting, melena, or hematochezia.   Objective   Vital signs in last 24 hours Temp:  [97.5 F (36.4 C)-98.8 F (37.1 C)] 98.4 F (36.9 C) (07/28 0543) Pulse Rate:  [60-81] 81 (07/28 0543) Resp:  [18-20] 18 (07/28 0543) BP: (100-120)/(56-67) 100/56 (07/28 0543) SpO2:  [91 %-92 %] 91 % (07/28 0543) Weight:  [82 kg-85 kg] 85 kg (07/28 0549) Last BM Date : 11/28/21  Physical Exam General:   Alert and oriented, pleasant Head:  Normocephalic and atraumatic. Eyes:  No icterus, sclera clear. Heart:  S1, S2 present, no murmurs noted.  Lungs: Clear to auscultation bilaterally, without wheezing, rales, or rhonchi.  Abdomen:  Bowel sounds present, soft, mild distention but not taut.[name] No HSM or hernias noted. No rebound or guarding. No masses appreciated  Msk:  Symmetrical without gross deformities. Normal posture. Extremities: +3/push for swelling to shins, ankles, and feet.  +2 edema to knees and thighs.  Blister present to right lower ankle and left foot with dry dressing to the top of his foot and open draining blister to anterior aspect of his left foot Neurologic:  Alert and  oriented x4;  grossly normal neurologically. Skin: No rashes, slightly pale/gray appearance Psych:  Alert and cooperative. Normal mood and affect.  Intake/Output from previous day: 07/27 0701 - 07/28 0700 In:  1839.6 [P.O.:240; I.V.:784.7; IV Piggyback:814.9] Out: 600 [Urine:600] Intake/Output this shift: No intake/output data recorded.  Lab Results  Recent Labs    11/29/21 0456 11/30/21 0946 12/01/21 0448  WBC 3.7* 3.2* 4.9  HGB 8.5* 7.6* 7.9*  HCT 25.7* 22.3* 24.2*  PLT 54* 48* 46*   BMET Recent Labs    11/29/21 0456 11/30/21 0502 12/01/21 0448  NA 131* 132* 132*  K 3.0* 3.7 3.2*  CL 101 101 100  CO2 20* 21* 20*  GLUCOSE 79 196* 168*  BUN 44* 44* 48*  CREATININE 2.79* 2.73* 2.77*  CALCIUM 8.0* 8.2* 8.5*   LFT Recent Labs    11/28/21 1153 11/29/21 0456 11/30/21 0502 12/01/21 0448  PROT 4.9* 5.2* 5.6* 6.1*  ALBUMIN 2.1* 3.1* 4.1 4.7  AST 91* 62* 47* 33  ALT 84* 57* 40 33  ALKPHOS 53 39 30* 26*  BILITOT 1.9* 1.9* 2.6* 3.3*  BILIDIR 0.6*  --   --   --   IBILI 1.3*  --   --   --    PT/INR Recent Labs    11/30/21 0759 12/01/21 0448  LABPROT 23.0* 24.4*  INR 2.1* 2.2*   Hepatitis Panel No results for input(s): "HEPBSAG", "HCVAB", "HEPAIGM", "HEPBIGM" in the last 72 hours.   Studies/Results US Paracentesis  Result Date: 11/28/2021 INDICATION: Ascites EXAM: ULTRASOUND GUIDED DIAGNOSTIC AND THERAPEUTIC PARACENTESIS MEDICATIONS: None. COMPLICATIONS: None immediate. PROCEDURE: Informed written consent was obtained from the patient after a discussion of the risks, benefits and  alternatives to treatment. A timeout was performed prior to the initiation of the procedure. Initial ultrasound scanning demonstrates a large amount of ascites within the right lower abdominal quadrant. The right lower abdomen was prepped and draped in the usual sterile fashion. 1% lidocaine was used for local anesthesia. Following this, a 19 gauge, 7-cm, Yueh catheter was introduced. An ultrasound image was saved for documentation purposes. The paracentesis was performed. The catheter was removed and a dressing was applied. The patient tolerated the procedure well without immediate post procedural  complication. FINDINGS: A total of approximately 3.4 L of yellow ascitic fluid was removed. Samples were sent to the laboratory as requested by the clinical team. IMPRESSION: Successful ultrasound-guided paracentesis yielding 3.4 liters of peritoneal fluid. Electronically Signed   By: Lavonia Dana M.D.   On: 11/28/2021 14:25   [place] EKG SITE RITE  Result Date: 11/28/2021 If Site Rite image not attached, placement could not be confirmed due to current cardiac rhythm.  DG Chest Port 1 View  Result Date: 11/27/2021 CLINICAL DATA:  Shortness of breath EXAM: PORTABLE CHEST 1 VIEW COMPARISON:  11/21/2021 FINDINGS: Cardiac size is within normal limits. There are no signs of pulmonary edema. Linear densities are seen in both lower lung fields with slight improvement. No new focal infiltrates are seen. There is no pleural effusion or pneumothorax. IMPRESSION: Linear densities are seen in the lower lung fields suggesting atelectasis/pneumonia. There is interval improvement in the aeration of both lower lung fields. There are no new infiltrates or signs of pulmonary edema. Electronically Signed   By: Elmer Picker M.D.   On: 11/27/2021 15:57   CT ABDOMEN PELVIS W CONTRAST  Result Date: 11/24/2021 CLINICAL DATA:  Bowel obstruction suspected. Abdominal distension. Hypotension. EXAM: CT ABDOMEN AND PELVIS WITH CONTRAST TECHNIQUE: Multidetector CT imaging of the abdomen and pelvis was performed using the standard protocol following bolus administration of intravenous contrast. RADIATION DOSE REDUCTION: This exam was performed according to the departmental dose-optimization program which includes automated exposure control, adjustment of the mA and/or kV according to patient size and/or use of iterative reconstruction technique. CONTRAST:  160m OMNIPAQUE IOHEXOL 300 MG/ML  SOLN COMPARISON:  CT 6 days ago 11/08/2021 FINDINGS: Lower chest: Small left pleural effusion and adjacent atelectasis, new from prior exam. Trace  right pleural effusion. Heart is normal in size. There are paraesophageal varices. Hepatobiliary: Cirrhotic hepatic morphology. No focal hepatic lesion. Calcified gallstones. No obvious gallbladder wall thickening. There is no biliary dilatation. Pancreas: Parenchymal atrophy. No ductal dilatation or inflammation. Spleen: Again seen splenomegaly, greatest splenic dimension 13.5 cm. No focal splenic abnormality. Adrenals/Urinary Tract: No adrenal nodule. Mild bilateral renal atrophy. No hydronephrosis or focal renal abnormality. No renal calculi. Urinary bladder is minimally distended. Stomach/Bowel: Paraesophageal and perigastric varices. Assessment for bowel wall thickening is limited due to the presence of abdominal ascites. There is no abnormal bowel distension or obstruction. Normal appendix. Mild low-density wall thickening of the cecum and ascending colon. Left colonic diverticulosis, no focal diverticulitis. Vascular/Lymphatic: Aortic atherosclerosis without aneurysm. The portal vein is patent. Splenic vein is patent. Portosystemic collaterals noted in the abdomen. Paraesophageal and perigastric varices. No bulky abdominopelvic adenopathy. Reproductive: Prostate is unremarkable. Other: Moderate volume ascites with increase from prior exam. No free air. There is generalized body wall edema that has increased. No abdominal wall hernia. Musculoskeletal: There are no acute or suspicious osseous abnormalities. IMPRESSION: 1. No bowel obstruction. Mild low-density wall thickening of the cecum and ascending colon, may be due to portal colopathy.  2. Cirrhosis with portal hypertension and splenomegaly. Paraesophageal and perigastric varices. 3. Moderate volume ascites has increased. Generalized body wall edema has increased. Small left pleural effusion and adjacent atelectasis, new from prior exam. Trace right pleural effusion. 4. Cholelithiasis without CT findings of acute cholecystitis. 5. Colonic diverticulosis  without focal diverticulitis. Aortic Atherosclerosis (ICD10-I70.0). Electronically Signed   By: Keith Rake M.D.   On: 11/24/2021 00:49   DG Chest 2 View  Result Date: 11/21/2021 CLINICAL DATA:  Shortness of breath and weakness. EXAM: CHEST - 2 VIEW COMPARISON:  Chest two views 09/14/2013, CT chest 11/18/2021 FINDINGS: Cardiac and mediastinal contours are within limits. Interval decrease in bilateral lung volumes compared to 09/14/2013 radiographs, however this is likely similar to 11/18/2021 CT. There are bibasilar opacities including horizontal linear septal thickening that was seen on prior CT. Minimal blunting of the bilateral costophrenic angles raises the question of tiny bilateral pleural effusions, however none were seen on recent CT and this may be secondary to the peripheral inferior pulmonary fibrosis. No pneumothorax. Moderate multilevel degenerative disc changes of the midthoracic spine. IMPRESSION: Decreased lung volumes with bibasilar interlobular septal thickening corresponding to the mild interstitial pulmonary fibrosis seen on recent CT. Minimal blunting of the bilateral costophrenic angles raises the question of tiny bilateral pleural effusions, however as no pleural effusions were seen on recent CT, this may reflect the chronic interstitial scarring. Electronically Signed   By: Yvonne Kendall M.D.   On: 11/21/2021 12:58   [place] ASCITES (ABDOMEN LIMITED)  Result Date: 11/21/2021 CLINICAL DATA:  Elevated liver function tests.  Ascites. EXAM: LIMITED ABDOMEN ULTRASOUND FOR ASCITES TECHNIQUE: Limited ultrasound survey for ascites was performed in all four abdominal quadrants. COMPARISON:  11/18/2021 FINDINGS: Small amount of ascites is present in this patient with morphologic findings of cirrhosis. The amount of ascites is currently felt to be below the volume threshold at which paracentesis would yield a significant therapeutic benefit. IMPRESSION: 1. Small amount of scattered ascites in  the abdomen.  Cirrhosis. Electronically Signed   By: Van Clines M.D.   On: 11/21/2021 12:05   NM Hepato W/EF  Result Date: 11/20/2021 CLINICAL DATA:  Right upper quadrant abdominal pain x1 month, nondiagnostic ultrasound. EXAM: NUCLEAR MEDICINE HEPATOBILIARY IMAGING WITH GALLBLADDER EF TECHNIQUE: Sequential images of the abdomen were obtained out to 60 minutes following intravenous administration of radiopharmaceutical. After slow intravenous infusion of 1.5 micrograms Cholecystokinin, gallbladder ejection fraction was determined. RADIOPHARMACEUTICALS:  5.5 mCi Tc-68mCholetec IV COMPARISON:  Ultrasound November 18, 2021 FINDINGS: Delayed blood pool activity with delayed clearance of radiotracer from the liver. Prompt uptake and biliary excretion of activity by the liver is seen. Gallbladder activity is visualized, consistent with patency of cystic duct. Biliary activity passes into small bowel, consistent with patent common bile duct. There is an initial drop in radiotracer activity post CCK administration however there is a subsequent increase in activity towards the end of scintigraphic sequences. In the setting of delayed clearance of radiotracer from the liver. Calculated gallbladder ejection fraction of -56% (at 60 min, normal ejection fraction is greater than 40%.) IMPRESSION: 1. Delayed clearance of radiotracer from blood pool and the liver suggestive of hepatitis, which limits the utility of the study. 2. Reduced gallbladder ejection fraction, which is at least somewhat related to the delayed radiotracer clearance from the liver but is at least suggestive of chronic cholecystitis/biliary dyskinesia. Electronically Signed   By: JDahlia BailiffM.D.   On: 11/20/2021 13:06   [place] Limited RUQ (LIVER/GB)  Result Date: 11/18/2021 CLINICAL DATA:  Abnormal LFTs. EXAM: ULTRASOUND ABDOMEN LIMITED RIGHT UPPER QUADRANT COMPARISON:  CT of the abdomen and pelvis 11/18/2021 FINDINGS: Gallbladder:  Gallbladder wall is thickened measuring 5.3 mm. No sonographic Percell Miller sign is described. Shadowing gallstones measure up to 11 mm. Common bile duct: Diameter: 3.0 mm, within normal limits. Liver: Liver is shrunken within nodular appearance. Heterogeneous increased echogenicity noted. No discrete lesions are present. Portal vein is patent on color Doppler imaging with normal direction of blood flow towards the liver. Other: Moderate abdominal ascites is present. IMPRESSION: 1. Gallbladder wall thickening with cholelithiasis suggesting acute cholecystitis. 2. Findings suggestive of hepatic cirrhosis. 3. Ascites. Electronically Signed   By: San Morelle M.D.   On: 11/18/2021 12:01   CT CHEST ABDOMEN PELVIS WO CONTRAST  Result Date: 11/18/2021 CLINICAL DATA:  71 year old male history of weakness. Metastatic disease. EXAM: CT CHEST, ABDOMEN AND PELVIS WITHOUT CONTRAST TECHNIQUE: Multidetector CT imaging of the chest, abdomen and pelvis was performed following the standard protocol without IV contrast. RADIATION DOSE REDUCTION: This exam was performed according to the departmental dose-optimization program which includes automated exposure control, adjustment of the mA and/or kV according to patient size and/or use of iterative reconstruction technique. COMPARISON:  No priors. FINDINGS: CT CHEST FINDINGS Cardiovascular: Heart size is normal. There is no significant pericardial fluid, thickening or pericardial calcification. There is aortic atherosclerosis, as well as atherosclerosis of the great vessels of the mediastinum and the coronary arteries, including calcified atherosclerotic plaque in the left main, left anterior descending, left circumflex and right coronary arteries. No pathologically enlarged lymph nodes. Mediastinum/Nodes: No pathologically enlarged mediastinal or hilar lymph nodes. Please note that accurate exclusion of hilar adenopathy is limited on noncontrast CT scans. Esophagus is mildly  patulous and thickened without a discrete esophageal mass confidently identified. No axillary lymphadenopathy. Lungs/Pleura: 3 mm right upper lobe pulmonary nodule (axial image 79 of series 5). 5 mm right lower lobe pulmonary nodule (axial image 91 of series 5). No larger more suspicious appearing pulmonary nodules or masses are noted. No acute consolidative airspace disease. No pleural effusions. Patchy areas of peripheral predominant ground-glass attenuation, septal thickening and mild subpleural reticulation are noted throughout the mid to lower lungs bilaterally. No honeycombing. Musculoskeletal: There are no aggressive appearing lytic or blastic lesions noted in the visualized portions of the skeleton. CT ABDOMEN PELVIS FINDINGS Hepatobiliary: Liver has a shrunken appearance and nodular contour, indicative of underlying cirrhosis. No definite discrete cystic or solid hepatic lesions are confidently identified on today's noncontrast CT examination. Calcified gallstones lying dependently in the gallbladder. Gallbladder is moderately distended. Accurate assessment for pericholecystic fluid and inflammation is limited by presence of perihepatic ascites. Pancreas: No definite pancreatic mass. Haziness in the peripancreatic fat. No well-defined peripancreatic fluid collections. Spleen: Spleen is enlarged measuring 14.8 x 9.0 x 14.4 cm (estimated splenic volume of 959 mL) . Adrenals/Urinary Tract: Unenhanced appearance of the kidneys and bilateral adrenal glands is normal. No hydroureteronephrosis. Urinary bladder is unremarkable in appearance. Stomach/Bowel: Unenhanced appearance of the stomach is unremarkable. No pathologic dilatation of small bowel or colon. Numerous colonic diverticulae are noted, particularly in the sigmoid colon. No definite focal surrounding inflammatory changes to clearly indicate acute diverticulitis are noted at this time (assessment is limited by presence of small volume of ascites).  Appendix is not confidently identified. Vascular/Lymphatic: Atherosclerotic calcifications throughout the abdominal aorta and pelvic vasculature. Portal vein is dilated measuring 16 mm in the porta hepatis. No definite lymphadenopathy noted in the abdomen  or pelvis on today's noncontrast CT examination. Reproductive: Prostate gland and seminal vesicles are unremarkable in appearance. Other: Small volume of ascites.  No pneumoperitoneum. Musculoskeletal: There are no aggressive appearing lytic or blastic lesions noted in the visualized portions of the skeleton. IMPRESSION: 1. Cirrhosis with small volume of ascites. Dilated portal vein and splenomegaly indicative of portal hypertension. 2. Small pulmonary nodules measuring 5 mm or less in size in the right lung, nonspecific, but statistically likely benign. No follow-up needed if patient is low-risk (and has no known or suspected primary neoplasm). Non-contrast chest CT can be considered in 12 months if patient is high-risk. This recommendation follows the consensus statement: Guidelines for Management of Incidental Pulmonary Nodules Detected on CT Images: From the Fleischner Society 2017; Radiology 2017; 284:228-243. 3. No other definite signs of potential metastatic disease noted elsewhere in the chest, abdomen or pelvis on today's noncontrast CT examination. 4. Esophagus appears mildly patulous and diffusely thickened without discrete esophageal mass. Further evaluation with nonemergent endoscopy should be considered to better evaluate these findings in the near future. 5. Cholelithiasis without evidence of acute cholecystitis at this time. 6. Aortic atherosclerosis, in addition to left main and three-vessel coronary artery disease. Please note that although the presence of coronary artery calcium documents the presence of coronary artery disease, the severity of this disease and any potential stenosis cannot be assessed on this non-gated CT examination. Assessment  for potential risk factor modification, dietary therapy or pharmacologic therapy may be warranted, if clinically indicated. 7. Additional incidental findings, as above. Electronically Signed   By: Vinnie Langton M.D.   On: 11/18/2021 09:35   CT L-SPINE NO CHARGE  Result Date: 11/18/2021 CLINICAL DATA:  71 year old male being evaluated for metastatic disease. Weakness. Unable to walk. EXAM: CT LUMBAR SPINE WITHOUT CONTRAST TECHNIQUE: Technique: Multiplanar CT images of the lumbar spine were reconstructed from contemporary CT of the Abdomen and Pelvis. RADIATION DOSE REDUCTION: This exam was performed according to the departmental dose-optimization program which includes automated exposure control, adjustment of the mA and/or kV according to patient size and/or use of iterative reconstruction technique. CONTRAST:  None COMPARISON:  Thoracic spine CT, CT Chest, Abdomen, and Pelvis today reported separately. FINDINGS: Segmentation: Normal, concordant with the thoracic spine numbering today. Alignment: Normal lumbar lordosis. No spondylolisthesis. Minimal levoconvex lumbar scoliosis. Vertebrae: Bone mineralization is within normal limits for age. No lumbar vertebral fracture or lesion identified. Visible sacrum and SI joints appear intact. Paraspinal and other soft tissues: Abdominal and pelvic viscera are detailed separately. Lumbar paraspinal soft tissues are within normal limits. Disc levels: Age-appropriate lumbar spine degeneration. Disc bulging and endplate spurring with mild if any associated lumbar spinal stenosis. IMPRESSION: 1. Age-appropriate noncontrast CT appearance of the Lumbar Spine. 2.  CT Chest, Abdomen, and Pelvis today are reported separately. Electronically Signed   By: Genevie Ann M.D.   On: 11/18/2021 09:34   CT T-SPINE NO CHARGE  Result Date: 11/18/2021 CLINICAL DATA:  71 year old male being evaluated for metastatic disease. Weakness. Unable to walk. EXAM: CT THORACIC SPINE WITHOUT CONTRAST  TECHNIQUE: Multiplanar CT images of the thoracic spine were reconstructed from contemporary CT of the Chest. RADIATION DOSE REDUCTION: This exam was performed according to the departmental dose-optimization program which includes automated exposure control, adjustment of the mA and/or kV according to patient size and/or use of iterative reconstruction technique. CONTRAST:  None COMPARISON:  CT cervical spine, CT Chest, Abdomen, and Pelvis today reported separately. FINDINGS: Limited cervical spine imaging:  Reported separately  today. Thoracic spine segmentation:  Normal. Alignment: Normal for age thoracic kyphosis. No spondylolisthesis. No scoliosis. Vertebrae: Bone mineralization is within normal limits for age. No thoracic vertebral lesion by CT. No thoracic vertebral compression or fracture identified. Visible posterior ribs appear intact. Paraspinal and other soft tissues: Chest and abdominal viscera are reported separately. Thoracic paraspinal soft tissues are within normal limits. Disc levels: Age-appropriate thoracic spine degeneration. No CT evidence of thoracic spinal stenosis. Unremarkable noncontrast appearance of the thoracic spinal canal. IMPRESSION: 1. Normal for age noncontrast CT appearance of the Thoracic Spine. 2.  CT Chest, Abdomen, and Pelvis today are reported separately. Electronically Signed   By: Genevie Ann M.D.   On: 11/18/2021 09:30   CT HEAD WO CONTRAST (5MM)  Result Date: 11/18/2021 CLINICAL DATA:  71 year old male being evaluated for metastatic disease. Weakness. Unable to walk. EXAM: CT HEAD WITHOUT CONTRAST TECHNIQUE: Contiguous axial images were obtained from the base of the skull through the vertex without intravenous contrast. RADIATION DOSE REDUCTION: This exam was performed according to the departmental dose-optimization program which includes automated exposure control, adjustment of the mA and/or kV according to patient size and/or use of iterative reconstruction technique.  COMPARISON:  None Available. FINDINGS: Brain: Fairly normal for age cerebral volume. No midline shift, ventriculomegaly, mass effect, evidence of mass lesion, intracranial hemorrhage or evidence of cortically based acute infarction. Gray-white matter differentiation is within normal limits throughout the brain. No encephalomalacia or cerebral edema identified. Vascular: Calcified atherosclerosis at the skull base. Skull: No acute or suspicious bone lesion identified. Sinuses/Orbits: Visualized paranasal sinuses and mastoids are well aerated. Trace bilateral mastoid air cell fluid. Small mucous retention cyst right maxillary sinus. Other: Negative visible nasopharynx. No suspicious orbit or scalp soft tissue finding. IMPRESSION: Negative for age noncontrast Head CT. Note that early metastatic disease to the brain is not excluded in the absence of intravenous contrast. Electronically Signed   By: Genevie Ann M.D.   On: 11/18/2021 09:26   CT CERVICAL SPINE WO CONTRAST  Result Date: 11/18/2021 CLINICAL DATA:  71 year old male being evaluated for metastatic disease. Weakness. Unable to walk. EXAM: CT CERVICAL SPINE WITHOUT CONTRAST TECHNIQUE: Multidetector CT imaging of the cervical spine was performed without intravenous contrast. Multiplanar CT image reconstructions were also generated. RADIATION DOSE REDUCTION: This exam was performed according to the departmental dose-optimization program which includes automated exposure control, adjustment of the mA and/or kV according to patient size and/or use of iterative reconstruction technique. COMPARISON:  Head CT reported separately. FINDINGS: Alignment: Mild straightening of cervical lordosis. Cervicothoracic junction alignment is within normal limits. Bilateral posterior element alignment is within normal limits. Skull base and vertebrae: Bone mineralization is within normal limits for age. Visualized skull base is intact. No atlanto-occipital dissociation. No acute or  suspicious osseous lesion identified in the cervical spine. Soft tissues and spinal canal: No prevertebral fluid or swelling. No visible canal hematoma. Negative noncontrast visible neck soft tissues except for calcified carotid atherosclerosis. Disc levels: Ordinary lower cervical disc and endplate degeneration, bulky at C6-C7, eccentric to the left, and associated with up to mild spinal stenosis at that level. But no other convincing cervical spinal stenosis by CT. There is associated moderate to severe left C7 proximal foraminal stenosis. Upper chest: Chest CT, thoracic spine CT reported separately. IMPRESSION: 1. No acute or metastatic process identified in the Cervical Spine by noncontrast CT. 2. Ordinary cervical spine degeneration at C6-C7 with suspected mild spinal stenosis and moderate to severe left C7 foraminal stenosis. Electronically  Signed   By: Genevie Ann M.D.   On: 11/18/2021 09:24    Assessment  71 y.o. male with a history of thrombocytopenia, macrocytic anemia, copper deficiency currently following with hematology, CKD, HLD, CAD, diabetes, HTN, cirrhosis complicated by ascites and portal hypertension with paraesophageal and perigastric varices on imaging, gallstones, recent admission and discharged on 7/19 with rhabdomyolysis and found to have new diagnosis of cirrhosis as well as elevated LFTs with negative work-up while inpatient however Wilson's disease has remained in question.  He presented to the ED 7/24 with worsening fatigue, weakness, increasing lower extremity edema, and abdominal distention and found to have significant anasarca on exam, labs revealing AKI, and CXR with possible atelectasis/pneumonia.  He was admitted with GI and nephrology consultation to assist with management.  Decompensated cirrhosis: Patient recently diagnosed with cirrhosis, etiology remains unclear however there has been concern for Wilson's disease given low ceruplasmin, low serum copper (previously on copper  supplementation), elevated 24-hour urine copper while on supplementation.  Prior work-up was been negative for autoimmune hepatitis or viral etiology.  He needs ophthalmology evaluation to rule out KF rings molecular genetic testing for mutations in ATP7B available however would be a 2 to 3-week turnaround.  Wilson's would be atypical with this patient given his age and presenting with no prior complications.  Liver biopsy will be helpful given unclear etiology and rising MELD score, patient's family requested to discuss further about themselves before making decision to proceed with potential transfer to [place] or [place] versus transfer to [facility] for liver biopsy.  Patient has been receiving heparin however and 30 this should not affect his rising INR.  Weight is down 0.2 kg today.  MELD Na 26 on admission.  MELD Na 32 today/ MELD 3.0: 33.  Anasarca appears to be stable, abdominal distention stable as well as it is soft and not felt, mildly distended.  Patient is alert and oriented x4 with some continued fatigue and lack appetite.  No asterixis today. Patient ambulated to bedside commode with little assistance during my exam today.  I spoke with Retta Mac and other family members this afternoon and they have agreed to whatever plan is necessary to care of Mr. Nabozny which includes transfer to tertiary center such as [facility] or [facility].   Elevated LFTs: Bilirubin with slight bump in today to 3.3, was 1.9 on admission.  Alk phos remains low at 26.  AST and ALT have normalized today with both being at 33.  AKI: Slight bump in creatinine today to 2.77 (2.73) however overall improved since 7/25 (2.89).  Nephrology continues to follow the patient and feels like is multifactorial given ischemia and prerenal insults with hypotension and recent IV contrast exposure.  Concern for HRS as well, receiving midodrine and octreotide.  Diuretics being managed by nephrology.  Urine output documented as 600 cc in the  last 24 hours and 1 undocumented occurrence.   Anemia: Chronic.  Baseline is 11-12.  Patient follows with hematology.  Denies any overt GI bleeding  Plan / Recommendations  Transfer to [facility] or [facility] for further evaluation, currently awaiting to hear back from [place] Will await to hear from tertiary center regarding transfer prior to attempting biopsy with IR at [facility].  Continue midodrine, octreotide, diuretics per nephrology Daily MELD labs -CBC, CMP, PT/INR Continue lactulose 2 g sodium diet Daily weights Strict I&Os EGD outpatient for variceal screening Outpatient ophthalmology exam for evaluation of KF rings    LOS: 4 days  12/01/2021, 9:44 AM   Venetia Night, MSN, FNP-BC, AGACNP-BC [facility]

## 2021-12-01 NOTE — Progress Notes (Signed)
PROGRESS NOTE   [name]  IZT:[id number] DOB: [birth year] DOA: 11/27/2021 PCP: [name], FNP   Chief Complaint  Patient presents with   Hypotension   Level of care: Telemetry  Brief Admission History:   72 y.o. male with past medical history relevant for moderate thrombocytopenia, macrocytic anemia with superimposed iron deficiency, copper deficiency, CKD 3B, HLD, CAD, DM2 , liver cirrhosis and gallstones and HTN who presents to the ED with worsening fatigue, weakness, myalgias and malaise and increasing lower extremity and scrotal edema -Patient was discharged on 11/24/2019 after IV fluids for rhabdomyolysis -Additional history obtained from patient's ex wife and sister-in-law at bedside Pt was sent over from PCPs clinic due to increasing edema and soft blood pressure -Patient endorses dyspnea at rest and dyspnea on exertion but no frank chest pains, no leg pains or pleuritic symptoms -Troponin is 11 -UA is not suggestive of UTI -Lactic acid is 1.9 -Sodium 131, glucose 47 -Lipase is 151 patient has chronically elevated lipase -Total CK down to 367 -Creatinine is up to 2.64 creatinine was 1.58 on 11/23/2021 -LFTs continue to trend down since discharge, INR 1.4 which is close to patient's baseline -WBC 8.4 hemoglobin 11.6 which is close to prior baseline platelets 97 which is higher than prior baseline -Chest x-ray with possible pneumonia no CHF type findings   Assessment and Plan:  1)Volume Overload/Anasarca--- secondary to decompensated liver cirrhosis -Now presenting with significant lower extremity edema including scrotal and penile edema -Nephrology consult appreciated- -Midodrine increased to 20 mg 3 times daily, continue octreotide for possible HRS 12/01/21---  -Urine output remains poor despite IV Lasix -Bladder scan appears to be over estimating bladder urine volume due to patient also having concomitant ascites  2)AKI--acute kidney injury on CKD stage -3A     --Creatinine bumped on IV lasix which is being held now - consulted with nephrologist - see recommendations - renally adjust medications, avoid nephrotoxic agents / dehydration  / hypotension - patient follows with Dr. Theador Hawthorne on outpatient basis  -Creatinine currently in the 2.7 range   3)Liver Cirrhosis with Ascites and Portal Hypertension---?Etiology -No significant alcohol use - s/p paracentesis 11/28/21 with 3.4L removed -Patient was previously on atorvastatin and copper supplements -Viral hepatitis profile was negative (negative for hep C and hep B may have had hep A infection in the past with antibodies noted) --Ultrasound from December 10, 2021 reveals not enough fluid for paracentesis -Patient work-up by GI service included ANA, mitochondrial antibodies, anti-smooth antibodies, IgG IgA and IgM - -Pt would benefit from EGD and liver biopsy  (plan is for outpatient transjugular liver biopsy at [facility] ) -AST/ALT and alk phos normalizing, T. bili remains elevated 12/01/21 -Dr. Jenetta Downer from GI service discussed the case with Dr. Fredderick Phenix from [facility] transplant hepatology who accepted his case for transfer and potential evaluation for liver transplant.  Bed availability is pending.   4)-cholelithiasis - CT abdomen and pelvis and right upper quadrant ultrasound consistent with gallstones and cholecystitis -Recent surgical consult appreciated -Recent GI consult appreciated HIDA scan with cystic duct patency noted- -EGD as outpatient to rule out varices    5)History of copper deficiency--- has been on copper replacement for over 1 year -Random Urine Copper high at 72 (09/12/21) -Hold copper tablets as copper toxicity can lead to hepatic dysfunction -Discussed with patient's hematologist Dr. Delton Coombes--- liver biopsy will be able to tell if patient has copper toxicity or not (outpatient transjugular liver biopsy at [facility] )   6) leukopenia with anemia  and Thrombocytopenia---  with macrocytosis --diagnosed in May 2021 -Worsening pancytopenia -- SPEP in June 2021 was normal.  Erythropoietin normal at 11.9. -Previously declined bone marrow biopsy for evaluation of possible early MDS -Previously treated with copper supplements -Prior work-up revealed somewhat low iron --- B12 and Folate were Not low  7)DM2---  A1c- 6.7 on 11/14/2021.[name]  Reflecting good diabetic control PTA -Patient having episodes of severe hypoglycemia,  -C/N  IV fluids to D5 normal saline due to hyponatremia Use Novolog/Humalog Sliding scale insulin with Accu-Cheks/Fingersticks as ordered   8)-Esophagitis--- recent CT chest shows -Patulous and thickened esophagus. -Protonix as ordered EGD as outpatient to be done as noted above #1   9)Social/ethics--- Discussed with patient's ex-wife and and sister-in-law at bedside -Patient is a full code   10)Recurrent severe Hypoglycemia----worsened by sulfonylurea use, impaired gluconeogenesis and impaired glucagon due to decompensated liver disease, due to poor oral intake in setting of underlying liver cirrhosis, I  -Insulin therapy discontinued -feed liberally and frequently  -Give Megace for appetite stimulation -Continue IV dextrose  11)CAP_-patient with some shortness of breath, chest x-ray suggestive of pneumonia -c/n ceftriaxone/azithromycin along with bronchodilators and mucolytics  12)HypoNatremia----Change IV fluids to D5 normal saline due to hyponatremia   Disposition/Need for in-Hospital Stay- patient unable to be discharged at this time due to -anasarca/volume overload, decompensated liver cirrhosis  -Awaiting transfer to [facility]/hepatology/transplant team pending bed availability   DVT prophylaxis: SCDs Code Status: Full  Family Communication: none present Disposition: --Awaiting transfer to [place] [facility]/hepatology/transplant team pending bed availability  Status is: Inpatient Remains inpatient appropriate because: intensity     Consultants:  GI Nephrology   Procedures:  Paracentesis 7/25 ~3.4L ascitic fluid removed Antimicrobials:  Ceftriaxone/azithromycin    Subjective: --Urine output remains poor despite IV Lasix -Appetite is not great  Objective: Vitals:   12/01/21 0500 12/01/21 0543 12/01/21 0549 12/01/21 1210  BP:  (!) 100/56  91/60  Pulse:  81  64  Resp:  18  16  Temp:  98.4 F (36.9 C)  (!) 97.4 F (36.3 C)  TempSrc:  Oral    SpO2:  91%  92%  Weight: 82 kg  85 kg   Height:        Intake/Output Summary (Last 24 hours) at 12/01/2021 2000 Last data filed at 12/01/2021 1700 Gross per 24 hour  Intake 1400.44 ml  Output 400 ml  Net 1000.44 ml   Filed Weights   11/30/21 0500 12/01/21 0500 12/01/21 0549  Weight: 85.2 kg 82 kg 85 kg   Examination:  Physical Examination: General appearance - alert,  in no distress, speaking in complete sentences Mental status - alert, oriented to person, place, and time,  Eyes - sclera anicteric Neck - supple, no JVD elevation , Chest -fair air movement bilaterally, no wheezing  heart - S1 and S2 normal, regular  Abdomen - soft, nontender, nondistended, +BS Neurological - screening mental status exam normal, neck supple without rigidity, cranial nerves II through XII intact, DTR's normal and symmetric Extremities -+3 pedal edema noted, intact peripheral pulses  Skin - warm, dry GU--scrotal  and penile edema  Data Reviewed: I have personally reviewed following labs and imaging studies  CBC: Recent Labs  Lab 11/27/21 1500 11/28/21 1153 11/29/21 0456 11/30/21 0946 12/01/21 0448  WBC 8.4 5.6 3.7* 3.2* 4.9  NEUTROABS 6.0  --   --   --   --   HGB 11.6* 9.8* 8.5* 7.6* 7.9*  HCT 34.0* 28.2* 25.7* 22.3* 24.2*  MCV  98.6 98.6 100.0 100.5* 103.4*  PLT 97* 63* 54* 48* 46*    Basic Metabolic Panel: Recent Labs  Lab 11/27/21 1500 11/28/21 0447 11/29/21 0456 11/30/21 0502 12/01/21 0448  NA 131* 131* 131* 132* 132*  K 3.8 3.6 3.0* 3.7 3.2*  CL 99  102 101 101 100  CO2 21* 20* 20* 21* 20*  GLUCOSE 47* 55* 79 196* 168*  BUN 47* 48* 44* 44* 48*  CREATININE 2.64* 2.89* 2.79* 2.73* 2.77*  CALCIUM 8.4* 7.8* 8.0* 8.2* 8.5*  MG 2.5*  --  2.3  --   --    CBG: Recent Labs  Lab 11/30/21 2343 12/01/21 0411 12/01/21 0729 12/01/21 1111 12/01/21 1619  GLUCAP 221* 161* 172* 216* 191*    Recent Results (from the past 240 hour(s))  Gram stain     Status: None   Collection Time: 11/28/21  1:50 PM   Specimen: Peritoneal Cavity  Result Value Ref Range Status   Specimen Description PERITONEAL CAVITY  Final   Special Requests PERITONEAL CAVITY  Final   Gram Stain   Final    WBC PRESENT, PREDOMINANTLY MONONUCLEAR NO ORGANISMS SEEN CYTOSPIN SMEAR Performed at [facility], [address] [place], [place] [address]    Report Status 11/28/2021 FINAL  Final  Culture, body fluid w Gram Stain-bottle     Status: None (Preliminary result)   Collection Time: 11/28/21  1:50 PM   Specimen: Peritoneal Cavity  Result Value Ref Range Status   Specimen Description PERITONEAL CAVITY  Final   Special Requests   Final    BOTTLES DRAWN AEROBIC AND ANAEROBIC Blood Culture adequate volume 10cc   Culture   Final    NO GROWTH 3 DAYS Performed at [facility], [address] [place], [place] [address]    Report Status PENDING  Incomplete    Radiology Studies: No results found.  Scheduled Meds:  aspirin  81 mg Oral Daily   brimonidine  1 drop Both Eyes BID   dextromethorphan-guaiFENesin  1 tablet Oral BID   furosemide  40 mg Intravenous BID   lactulose  20 g Oral BID   megestrol  400 mg Oral Daily   midodrine  20 mg Oral TID WC   octreotide  100 mcg Subcutaneous TID   pantoprazole  40 mg Oral Daily   sodium chloride flush  3 mL Intravenous Q12H   sodium chloride flush  3 mL Intravenous Q12H   tamsulosin  0.4 mg Oral BID   traZODone  100 mg Oral QHS   Continuous Infusions:  sodium chloride     azithromycin Stopped (12/01/21 0108)    cefTRIAXone (ROCEPHIN)  IV Stopped (12/01/21 0006)    LOS: 4 days   Roxan Hockey, MD How to contact the [facility] Attending or Consulting provider [place] or covering provider during after hours [place], for this patient?  Check the care team in [facility] and look for a) attending/consulting [facility] provider listed and b) the [facility] team listed Log into [email or web address] and use [place]'s universal password to access. If you do not have the password, please contact the hospital operator. Locate the [facility] provider you are looking for under [facility] and page to a number that you can be directly reached. If you still have difficulty reaching the provider, please page the [facility] (Director on Call) for the Hospitalists listed on amion for assistance.  12/01/2021, 8:00 PM

## 2021-12-01 NOTE — Progress Notes (Signed)
Bladder scan showed >900 mL. In and out cath only collected 200 mL of clear, yellow urine.Pt. tolerated well. MD notified

## 2021-12-01 NOTE — Progress Notes (Addendum)
[facility] Progress Note   Assessment/ Plan:   # Cirrhosis with ascites - s/p paracentesis 11/28/21 with 3.4L off - on albumin challenge  - increasing midodrine to 20 mg TID and continuing ocreotide for possible HRS - on cefriaxone and azithro (likely covering CAP and possible SBP but paracentesis looks noninfected) - adding fluid restriction - GI working up for possible Wilson's disease--> copper levels high, going for liver biopsy - on lactulose   # AKI/ possible HRS - Ischemic and pre-renal insults with hypotension.  He has a history of recent IV contrast exposure on 7/20 for a CT scan.  Possible HRS complicating whole picture - on albumin/ midodrine/ octreotide  - d/c Foley, bladder scan - Lasix 40 IV BID- increasing today - strict I and O     # Hypoglycemia - low rate of dextrose containing fluids per primary team.--> hopefully can stop soon    # Hyponatremia - Please switch to dextrose with normal saline if will be prolonged need for dextrose containing fluids   # Anemia - macrocytic  - setting of cirrhosis  - mild at this time   #CAP: - on azithro and ceftriaxone  #Hypokalemia: replete prn    Subjective:    Seen in room.  On the phone.  Appears a little confused.     Objective:   BP (!) 100/56 (BP Location: Right Arm)   Pulse 81   Temp 98.4 F (36.9 C) (Oral)   Resp 18   Ht '5\' 10"'$  (1.778 m)   Wt 85 kg   SpO2 91%   BMI 26.87 kg/m   Intake/Output Summary (Last 24 hours) at 12/01/2021 0935 Last data filed at 12/01/2021 7124 Gross per 24 hour  Intake 1839.63 ml  Output 600 ml  Net 1239.63 ml   Weight change: -3.2 kg  Physical Exam: Gen: NAD, pale CVS:RRR Resp:clear PYK:DXIPJASNK with + fluid wave Ext: 3+ edema to the flank  Imaging: No results found.  Labs: BMET Recent Labs  Lab 11/27/21 1500 11/28/21 0447 11/29/21 0456 11/30/21 0502 12/01/21 0448  NA 131* 131* 131* 132* 132*  K 3.8 3.6 3.0* 3.7 3.2*  CL 99 102 101 101  100  CO2 21* 20* 20* 21* 20*  GLUCOSE 47* 55* 79 196* 168*  BUN 47* 48* 44* 44* 48*  CREATININE 2.64* 2.89* 2.79* 2.73* 2.77*  CALCIUM 8.4* 7.8* 8.0* 8.2* 8.5*   CBC Recent Labs  Lab 11/27/21 1500 11/28/21 1153 11/29/21 0456 11/30/21 0946 12/01/21 0448  WBC 8.4 5.6 3.7* 3.2* 4.9  NEUTROABS 6.0  --   --   --   --   HGB 11.6* 9.8* 8.5* 7.6* 7.9*  HCT 34.0* 28.2* 25.7* 22.3* 24.2*  MCV 98.6 98.6 100.0 100.5* 103.4*  PLT 97* 63* 54* 48* 46*    Medications:     aspirin  81 mg Oral Daily   brimonidine  1 drop Both Eyes BID   dextromethorphan-guaiFENesin  1 tablet Oral BID   lactulose  20 g Oral BID   megestrol  400 mg Oral Daily   midodrine  20 mg Oral TID WC   octreotide  100 mcg Subcutaneous TID   pantoprazole  40 mg Oral Daily   sodium chloride flush  3 mL Intravenous Q12H   sodium chloride flush  3 mL Intravenous Q12H   tamsulosin  0.4 mg Oral BID   traZODone  100 mg Oral QHS    Madelon Lips, MD 12/01/2021, 9:35 AM

## 2021-12-02 ENCOUNTER — Inpatient Hospital Stay (HOSPITAL_COMMUNITY): Payer: 59

## 2021-12-02 DIAGNOSIS — K7682 Hepatic encephalopathy: Secondary | ICD-10-CM | POA: Diagnosis not present

## 2021-12-02 DIAGNOSIS — R188 Other ascites: Secondary | ICD-10-CM | POA: Diagnosis not present

## 2021-12-02 DIAGNOSIS — N179 Acute kidney failure, unspecified: Secondary | ICD-10-CM | POA: Diagnosis not present

## 2021-12-02 DIAGNOSIS — K7460 Unspecified cirrhosis of liver: Secondary | ICD-10-CM | POA: Diagnosis not present

## 2021-12-02 LAB — COMPREHENSIVE METABOLIC PANEL
ALT: 30 U/L (ref 0–44)
AST: 27 U/L (ref 15–41)
Albumin: 3.9 g/dL (ref 3.5–5.0)
Alkaline Phosphatase: 28 U/L — ABNORMAL LOW (ref 38–126)
Anion gap: 9 (ref 5–15)
BUN: 47 mg/dL — ABNORMAL HIGH (ref 8–23)
CO2: 21 mmol/L — ABNORMAL LOW (ref 22–32)
Calcium: 8.4 mg/dL — ABNORMAL LOW (ref 8.9–10.3)
Chloride: 105 mmol/L (ref 98–111)
Creatinine, Ser: 2.5 mg/dL — ABNORMAL HIGH (ref 0.61–1.24)
GFR, Estimated: 27 mL/min — ABNORMAL LOW (ref >60)
Glucose, Bld: 134 mg/dL — ABNORMAL HIGH (ref 70–99)
Potassium: 3.1 mmol/L — ABNORMAL LOW (ref 3.5–5.1)
Sodium: 135 mmol/L (ref 135–145)
Total Bilirubin: 3 mg/dL — ABNORMAL HIGH (ref 0.3–1.2)
Total Protein: 5.6 g/dL — ABNORMAL LOW (ref 6.5–8.1)

## 2021-12-02 LAB — CBC
HCT: 24.9 % — ABNORMAL LOW (ref 39.0–52.0)
Hemoglobin: 8.3 g/dL — ABNORMAL LOW (ref 13.0–17.0)
MCH: 33.6 pg (ref 26.0–34.0)
MCHC: 33.3 g/dL (ref 30.0–36.0)
MCV: 100.8 fL — ABNORMAL HIGH (ref 80.0–100.0)
Platelets: 49 10*3/uL — ABNORMAL LOW (ref 150–400)
RBC: 2.47 MIL/uL — ABNORMAL LOW (ref 4.22–5.81)
RDW: 15.9 % — ABNORMAL HIGH (ref 11.5–15.5)
WBC: 4.4 10*3/uL (ref 4.0–10.5)
nRBC: 0 % (ref 0.0–0.2)

## 2021-12-02 LAB — GLUCOSE, CAPILLARY
Glucose-Capillary: 134 mg/dL — ABNORMAL HIGH (ref 70–99)
Glucose-Capillary: 167 mg/dL — ABNORMAL HIGH (ref 70–99)
Glucose-Capillary: 174 mg/dL — ABNORMAL HIGH (ref 70–99)
Glucose-Capillary: 176 mg/dL — ABNORMAL HIGH (ref 70–99)
Glucose-Capillary: 188 mg/dL — ABNORMAL HIGH (ref 70–99)

## 2021-12-02 LAB — PROTIME-INR
INR: 2.2 — ABNORMAL HIGH (ref 0.8–1.2)
Prothrombin Time: 24.5 seconds — ABNORMAL HIGH (ref 11.4–15.2)

## 2021-12-02 MED ORDER — MIDODRINE HCL 10 MG PO TABS: 20.0000 mg | ORAL_TABLET | Freq: Three times a day (TID) | ORAL | 0 refills | Status: DC

## 2021-12-02 MED ORDER — FUROSEMIDE 10 MG/ML IJ SOLN: 40.0000 mg | Freq: Two times a day (BID) | INTRAMUSCULAR | 0 refills | Status: DC

## 2021-12-02 MED ORDER — OCTREOTIDE ACETATE 100 MCG/ML IJ SOLN: 100.0000 ug | Freq: Three times a day (TID) | INTRAMUSCULAR | 1 refills | Status: DC

## 2021-12-02 MED ORDER — POTASSIUM CHLORIDE CRYS ER 20 MEQ PO TBCR
40.0000 meq | EXTENDED_RELEASE_TABLET | ORAL | Status: AC
  Administered 2021-12-02 (×2): 40 meq via ORAL
  Filled 2021-12-02 (×2): qty 2

## 2021-12-02 MED ORDER — MEGESTROL ACETATE 400 MG/10ML PO SUSP: 400.0000 mg | Freq: Every day | ORAL | 0 refills | Status: DC

## 2021-12-02 MED ORDER — TAMSULOSIN HCL 0.4 MG PO CAPS: 0.4000 mg | ORAL_CAPSULE | Freq: Two times a day (BID) | ORAL | 1 refills | Status: DC

## 2021-12-02 NOTE — Progress Notes (Addendum)
Edward [name], M.D. Gastroenterology & Hepatology   Interval History:  No acute events overnight. Patient reports that he feels the same as yesterday.  Was able to urinate small amount (600 cc) after receiving furosemide per nephrology recommendations. Has been eating some small amount of food as he feels full easily and reported he is still feeling distention of his abdomen but is not causing any respiratory distress at the moment.  However, he reported having worsening shortness of breath yesterday and was put on oxygen.  No melena, hematochezia, nausea, vomiting, fever or chills. Labs today showed mild improvement of his creatinine down to 2.50, total bilirubin also decreased down to 3.0, albumin was 3.9.  Hemoglobin has remained stable at 8.3.  INR has not changed was 2.2.  Inpatient Medications:  Current Facility-Administered Medications:    0.9 %  sodium chloride infusion, , Intravenous, PRN, Emokpae, Courage, MD   acetaminophen (TYLENOL) tablet 650 mg, 650 mg, Oral, Q6H PRN **OR** acetaminophen (TYLENOL) suppository 650 mg, 650 mg, Rectal, Q6H PRN, Emokpae, Courage, MD   albuterol (PROVENTIL) (2.5 MG/3ML) 0.083% nebulizer solution 2.5 mg, 2.5 mg, Nebulization, Q2H PRN, Emokpae, Courage, MD   aspirin chewable tablet 81 mg, 81 mg, Oral, Daily, Emokpae, Courage, MD, 81 mg at 12/02/21 0825   azithromycin (ZITHROMAX) 500 mg in sodium chloride 0.9 % 250 mL IVPB, 500 mg, Intravenous, Q24H, Emokpae, Courage, MD, Last Rate: 250 mL/hr at 12/01/21 2351, 500 mg at 12/01/21 2351   bisacodyl (DULCOLAX) suppository 10 mg, 10 mg, Rectal, Daily PRN, Emokpae, Courage, MD   brimonidine (ALPHAGAN) 0.2 % ophthalmic solution 1 drop, 1 drop, Both Eyes, BID, Emokpae, Courage, MD, 1 drop at 12/02/21 0826   cefTRIAXone (ROCEPHIN) 1 g in sodium chloride 0.9 % 100 mL IVPB, 1 g, Intravenous, Q24H, Emokpae, Courage, MD, Last Rate: 200 mL/hr at 12/01/21 2312, 1 g at 12/01/21 2312   dextromethorphan-guaiFENesin  ([place] DM) 30-600 MG per 12 hr tablet 1 tablet, 1 tablet, Oral, BID, Emokpae, Courage, MD, 1 tablet at 12/02/21 0825   furosemide (LASIX) injection 40 mg, 40 mg, Intravenous, BID, Madelon Lips, MD, 40 mg at 12/02/21 0825   lactulose (CHRONULAC) 10 GM/15ML solution 20 g, 20 g, Oral, BID, Montez Morita, Joeziah Voit, MD, 20 g at 12/02/21 3614   megestrol (MEGACE) 400 MG/10ML suspension 400 mg, 400 mg, Oral, Daily, Emokpae, Courage, MD, 400 mg at 12/02/21 0824   midodrine (PROAMATINE) tablet 20 mg, 20 mg, Oral, TID WC, Madelon Lips, MD, 20 mg at 12/02/21 0825   octreotide (SANDOSTATIN) injection 100 mcg, 100 mcg, Subcutaneous, TID, Madelon Lips, MD, 100 mcg at 12/02/21 0839   ondansetron (ZOFRAN) tablet 4 mg, 4 mg, Oral, Q6H PRN **OR** ondansetron (ZOFRAN) injection 4 mg, 4 mg, Intravenous, Q6H PRN, Emokpae, Courage, MD   pantoprazole (PROTONIX) EC tablet 40 mg, 40 mg, Oral, Daily, Emokpae, Courage, MD, 40 mg at 12/02/21 0827   polyethylene glycol (MIRALAX / GLYCOLAX) packet 17 g, 17 g, Oral, Daily PRN, Emokpae, Courage, MD   potassium chloride SA (KLOR-CON M) CR tablet 40 mEq, 40 mEq, Oral, Q3H, Emokpae, Courage, MD, 40 mEq at 12/02/21 0834   sodium chloride flush (NS) 0.9 % injection 3 mL, 3 mL, Intravenous, Q12H, Emokpae, Courage, MD, 3 mL at 12/02/21 0826   sodium chloride flush (NS) 0.9 % injection 3 mL, 3 mL, Intravenous, Q12H, Emokpae, Courage, MD, 3 mL at 12/02/21 0826   sodium chloride flush (NS) 0.9 % injection 3 mL, 3 mL, Intravenous, PRN, Roxan Hockey, MD  tamsulosin (FLOMAX) capsule 0.4 mg, 0.4 mg, Oral, BID, Emokpae, Courage, MD, 0.4 mg at 12/02/21 0826   traZODone (DESYREL) tablet 100 mg, 100 mg, Oral, QHS, Emokpae, Courage, MD, 100 mg at 12/01/21 2208   I/O    Intake/Output Summary (Last 24 hours) at 12/02/2021 1041 Last data filed at 12/02/2021 0700 Gross per 24 hour  Intake 290 ml  Output --  Net 290 ml     Physical Exam: Temp:  [97.4 F (36.3 C)-98.6 F (37  C)] 98.4 F (36.9 C) (07/29 0432) Pulse Rate:  [64-77] 77 (07/29 0432) Resp:  [16-19] 19 (07/29 0432) BP: (91-98)/(55-62) 97/55 (07/29 0432) SpO2:  [92 %-100 %] 94 % (07/29 0432) Weight:  [84.1 kg] 84.1 kg (07/29 0500)  Temp (24hrs), Avg:98.1 F (36.7 C), Min:97.4 F (36.3 C), Max:98.6 F (37 C) GENERAL: The patient is AO x3, in no acute distress. HEENT: Head is normocephalic and atraumatic. EOMI are intact. Mouth is well hydrated and without lesions. NECK: Supple. No masses LUNGS: Decreased in both lung fields with some mild rales on the bases. HEART: RRR, normal s1 and s2. ABDOMEN: Soft, nontender, no guarding, no peritoneal signs.  He has moderate distention but no tense ascites.  BS +. No masses. EXTREMITIES: Without any cyanosis, clubbing, rash, lesions.  Has +3 lower extremity edema up to his thighs. NEUROLOGIC: AOx3, no focal motor deficit. No asterixis. SKIN: no jaundice, no rashes  Laboratory Data: CBC:     Component Value Date/Time   WBC 4.4 12/02/2021 0615   RBC 2.47 (L) 12/02/2021 0615   HGB 8.3 (L) 12/02/2021 0615   HGB 12.2 (L) 11/14/2021 0820   HCT 24.9 (L) 12/02/2021 0615   HCT 35.5 (L) 11/14/2021 0820   PLT 49 (L) 12/02/2021 0615   PLT 61 (LL) 11/14/2021 0820   MCV 100.8 (H) 12/02/2021 0615   MCV 96 11/14/2021 0820   MCH 33.6 12/02/2021 0615   MCHC 33.3 12/02/2021 0615   RDW 15.9 (H) 12/02/2021 0615   RDW 14.4 11/14/2021 0820   LYMPHSABS 1.5 11/27/2021 1500   LYMPHSABS 0.9 11/14/2021 0820   MONOABS 0.7 11/27/2021 1500   EOSABS 0.1 11/27/2021 1500   EOSABS 0.0 11/14/2021 0820   BASOSABS 0.0 11/27/2021 1500   BASOSABS 0.0 11/14/2021 0820   COAG:  Lab Results  Component Value Date   INR 2.2 (H) 12/02/2021   INR 2.2 (H) 12/01/2021   INR 2.1 (H) 11/30/2021    BMP:     Latest Ref Rng & Units 12/02/2021    6:15 AM 12/01/2021    4:48 AM 11/30/2021    5:02 AM  BMP  Glucose 70 - 99 mg/dL 134  168  196   BUN 8 - 23 mg/dL 47  48  44   Creatinine 0.61  - 1.24 mg/dL 2.50  2.77  2.73   Sodium 135 - 145 mmol/L 135  132  132   Potassium 3.5 - 5.1 mmol/L 3.1  3.2  3.7   Chloride 98 - 111 mmol/L 105  100  101   CO2 22 - 32 mmol/L _0 Calcium 8.9 - 10.3 mg/dL 8.4  8.5  8.2     HEPATIC:     Latest Ref Rng & Units 12/02/2021    6:15 AM 12/01/2021    4:48 AM 11/30/2021    5:02 AM  Hepatic Function  Total Protein 6.5 - 8.1 g/dL 5.6  6.1  5.6   Albumin 3.5 - 5.0  g/dL 3.9  4.7  4.1   AST 15 - 41 U/L 27  33  47   ALT 0 - 44 U/L 30  33  40   Alk Phosphatase 38 - 126 U/L _0 Total Bilirubin 0.3 - 1.2 mg/dL 3.0  3.3  2.6     CARDIAC:  Lab Results  Component Value Date   CKTOTAL 367 11/27/2021      Imaging: I personally reviewed and interpreted the available labs, imaging and endoscopic files.   Assessment/Plan: 71 y.o. year old male  with past medical history of cryptogenic liver cirrhosis complicated by ascites and mild encephalopathy, copper deficiency following with hematology, CKD, HLD, CAD, DM 2, HTN, cirrhosis, gallstones, and recent admission for rhabdomyolysis (unknown etiology), who came to the hospital after presenting worsening anasarca and generalized fatigue.  Patient was found to have severe third spacing since admission along with worsening AKI.  He underwent a paracentesis during the current admission which was negative for SBP.  Nephrology was consulted due to worsening CKD, it was considered that his disease was possibly related to rhabdomyolysis, contrast-induced nephropathy and possible hepatorenal syndrome which has not responded to well to the use of octreotide and midodrine, as well as albumin infusion.  He was restarted on diuretics yesterday and he has made some urine with very mild improvement of his creatinine.  He is still very fluid overloaded and is currently presenting some respiratory symptoms which make me concerned for possible third spacing into his lungs -we will need to obtain a chest x-ray to  further evaluate this.  He will need to continue on oxygen for now.  Patient is presenting with decompensated liver cirrhosis of unknown etiology (there is a question of Wilson's disease given abnormal copper testing in the past but it would be unusual given his age) and worsening CKD.  Given his increasing MELD score, his case was discussed with [facility] transplant hepatology who accepted his case -bed availability is pending.  We will need to continue monitoring his MELD labs daily as well as his urine output strictly.  He had some mild encephalopathy during the current hospitalization which has improved with the use of lactulose.  No further decompensating events.  He may need to have an EGD for variceal screening evaluation, which will be deferred to the transplant center.  However, if he does not get transferred and gets discharged from the hospital, he will need to have this performed as outpatient, as well as a transjugular liver biopsy to determine the etiology of his liver disease.  - Check CBC, MELD labs daily - Low salt diet <2 g per day - Can take Tylenol max of 2 g per day (650 mg q8h) for pain - Avoid NSAIDs for pain - Hold diuretics if possible - Protein shake (Ensure or Boost) every night before going to sleep -Appreciate nephrology recommendations - Lactulose 20 g BID, uptitrate to achieve 2-3 Bms per day - Possible transfer to [facility] for inpatient Transplant hepatology evaluation  Edward Peppers, MD Gastroenterology and Hepatology [facility]

## 2021-12-02 NOTE — Discharge Summary (Signed)
[name], is a 71 y.o. male  DOB 1950/06/15  MRN [id number].  Admission date:  11/27/2021  Admitting Physician  Edward Hockey, MD  Discharge Date:  12/02/2021   Primary MD  Edward Pretty, FNP  Recommendations for primary care physician for things to follow:   Transfer to [name]--Dr. Fredderick [name] from [name] who accepted his case for transfer and potential evaluation for liver transplant--- - decompensated liver cirrhosis of unknown etiology (there is a question of [name]'s disease given abnormal copper testing in the past but it would be unusual given his age) and worsening CKD and increasing MELD score  Admission Diagnosis  Pneumonia [[name]]   Discharge Diagnosis  Pneumonia [[name]]    Principal Problem:   Decompensated cirrhosis of liver with ascites ([name]) Active Problems:   Pneumonia   Hypoglycemia   Anasarca   AKI (acute kidney injury) ([name])   Type 2 diabetes with nephropathy ([name])   Hypoalbuminemia      Past Medical History:  Diagnosis Date   Cataract    CKD (chronic kidney disease) stage 3, GFR 30-59 ml/min (HCC)    Coronary atherosclerosis of native coronary artery    BMS LAD 2006; residual 75% distal RCA; EF 50%   Essential hypertension    Glaucoma    Low back pain    MI (myocardial infarction) ([name])    Anterior 2006   Mixed hyperlipidemia    Renal insufficiency    Type 2 diabetes mellitus ([name])     Past Surgical History:  Procedure Laterality Date   CATARACT EXTRACTION, BILATERAL     CORONARY ANGIOPLASTY WITH STENT PLACEMENT  2006     HPI  from the history and physical done on the day of admission:     [name]  is a 71 y.o. male with past medical history relevant for moderate thrombocytopenia, macrocytic anemia with superimposed iron deficiency, copper deficiency, CKD 3B, HLD, CAD, DM2 , liver cirrhosis and gallstones  and HTN who presents to the ED with worsening fatigue, weakness, myalgias and malaise and increasing lower extremity and scrotal edema -Patient was discharged on 11/24/2019 after IV fluids for rhabdomyolysis -Additional history obtained from patient's ex wife and sister-in-law at bedside Pt was sent over from [facility] due to increasing edema and soft blood pressure -Patient endorses dyspnea at rest and dyspnea on exertion but no frank chest pains, no leg pains or pleuritic symptoms -Troponin is 11 -UA is not suggestive of UTI -Lactic acid is 1.9 -Sodium 131, glucose 47 -Lipase is 151 patient has chronically elevated lipase -Total CK down to 367 -Creatinine is up to 2.64 creatinine was 1.58 on 11/23/2021 -LFTs continue to trend down since discharge, INR 1.4 which is close to patient's baseline -WBC 8.4 hemoglobin 11.6 which is close to prior baseline platelets 97 which is higher than prior baseline -Chest x-ray with possible pneumonia no CHF type findings    [name] Course:      71 y.o. male with past medical history relevant for moderate  thrombocytopenia, macrocytic anemia with superimposed iron deficiency, copper deficiency, CKD 3B, HLD, CAD, DM2 , liver cirrhosis and gallstones and HTN who presents to the ED with worsening fatigue, weakness, myalgias and malaise and increasing lower extremity and scrotal edema -Patient was discharged on 11/24/2019 after IV fluids for rhabdomyolysis -Additional history obtained from patient's ex wife and sister-in-law at bedside Pt was sent over from [facility] due to increasing edema and soft blood pressure -Patient endorses dyspnea at rest and dyspnea on exertion but no frank chest pains, no leg pains or pleuritic symptoms -Troponin is 11 -UA is not suggestive of UTI -Lactic acid is 1.9 -Sodium 131, glucose 47 -Lipase is 151 patient has chronically elevated lipase -Total CK down to 367 -Creatinine is up to 2.64 creatinine was 1.58 on  11/23/2021 -LFTs continue to trend down since discharge, INR 1.4 which is close to patient's baseline -WBC 8.4 hemoglobin 11.6 which is close to prior baseline platelets 97 which is higher than prior baseline -Chest x-ray with possible pneumonia no CHF type findings  Assessment and Plan:  1)Volume Overload/Anasarca--- secondary to decompensated liver cirrhosis -Now presenting with significant lower extremity edema including scrotal and penile edema -Nephrology consult appreciated- -Midodrine increased to 20 mg 3 times daily, continue octreotide for possible HRS 12/02/21---  -Urine output remains poor despite IV Lasix -Bladder scan appears to be over estimating bladder urine volume due to patient also having concomitant ascites   2)AKI--acute kidney injury on CKD stage -3A    - consulted with nephrologist - see recommendations - renally adjust medications, avoid nephrotoxic agents / dehydration  / hypotension - patient follows with Dr. Theador Hawthorne on outpatient basis  -Creatinine currently in the 2.5 range   3)Liver Cirrhosis with Ascites and Portal Hypertension---?Etiology -No significant alcohol use - s/p paracentesis 11/28/21 with 3.4L removed -Patient was previously on atorvastatin and copper supplements -Viral hepatitis profile was negative (negative for hep C and hep B may have had hep A infection in the past with antibodies noted) --Ultrasound from 2021-11-26 reveals not enough fluid for paracentesis -Patient work-up by GI service included ANA, mitochondrial antibodies, anti-smooth antibodies, IgG IgA and IgM - -Pt would benefit from EGD and liver biopsy to determine etiology of liver cirrhosis -AST/ALT and alk phos normalizing, T. bili remains elevated 12/02/21 Transfer to [name]--Dr. [facility]derick [name] from [name] who accepted his case for transfer and potential evaluation for liver transplant--- - decompensated liver cirrhosis of unknown etiology (there is  a question of [name]'s disease given abnormal copper testing in the past but it would be unusual given his age) and worsening CKD and increasing MELD score   4)-cholelithiasis - CT abdomen and pelvis and right upper quadrant ultrasound consistent with gallstones and cholecystitis -Recent surgical consult appreciated -GI consult appreciated HIDA scan with cystic duct patency noted- -EGD as outpatient to rule out varices    5)History of copper deficiency--- has been on copper replacement for over 1 year -Random Urine Copper high at 72 (09/12/21) -Hold copper tablets as copper toxicity can lead to hepatic dysfunction -Discussed with patient's hematologist Dr. Delton Coombes--- liver biopsy will be able to tell if patient has copper toxicity or not    6) leukopenia with anemia and Thrombocytopenia--- with macrocytosis --diagnosed in May 2021 -Leukopenia resolved -Anemia and thrombocytopenia persists -- SPEP in June 2021 was normal.  Erythropoietin normal at 11.9. -Previously declined bone marrow biopsy for evaluation of possible early MDS -Previously treated with copper supplements -Prior work-up revealed somewhat low iron ---  B12 and Folate were Not low   7)DM2---  A1c- 6.7 on 11/14/2021.[name]  Reflecting good diabetic control PTA -Patient having episodes of severe hypoglycemia,  Patient received IV fluids including D5 normal saline due to hypoglycemia and hyponatremia   8)-Esophagitis--- recent CT chest shows -Patulous and thickened esophagus. -Protonix as ordered EGD as outpatient to be done as noted above #1   9)Social/ethics--- Discussed with patient's ex-wife, son [name] and and sister-in-law at bedside -Patient is a full code   10)Recurrent severe Hypoglycemia----worsened by sulfonylurea use, impaired gluconeogenesis and impaired glucagon due to decompensated liver disease, due to poor oral intake in setting of underlying liver cirrhosis, I  -Insulin therapy discontinued -feed liberally  and frequently  -Give Megace for appetite stimulation -Continue IV dextrose   11)CAP_-patient had dyspnea and chest x-ray suggestive of pneumonia -He was treated with ceftriaxone/azithromycin along with bronchodilators and mucolytics   12)HypoNatremia----sodium normalized with IV D5 normal saline    Disposition-- patient unable to be discharged at this time due to -anasarca/volume overload, decompensated liver cirrhosis  -Transfer to [facility]. [name] from [name] who accepted his case for transfer and potential evaluation for liver transplant--- - decompensated liver cirrhosis of unknown etiology (there is a question of [name]'s disease given abnormal copper testing in the past but it would be unusual given his age) and worsening CKD and increasing MELD score   DVT prophylaxis: SCDs Code Status: Full code Family Communication: Discussed with ex wife, son [name]   Consultants:  GI Nephrology    Procedures:  Paracentesis 7/25 ~3.4L ascitic fluid removed Antimicrobials:  Treated with ceftriaxone/azithromycin     Discharge Condition: Stable  Follow UP--[facility]   Consults obtained -GI/nephrology/general surgery/interventional radiology  Diet and Activity recommendation:  As advised  Discharge Instructions   Transfer to [name]--Dr. Fredderick [name] from [name] hepat[facility] who accepted his case for transfer and potential evaluation for liver transplant--- - decompensated liver cirrhosis of unknown etiology (there is a question of [name]'s disease given abnormal copper testing in the past but it would be unusual given his age) and worsening CKD and increasing MELD score  Discharge Instructions     Call MD for:  persistant dizziness or light-headedness   Complete by: As directed    Call MD for:  severe uncontrolled pain   Complete by: As directed    Call MD for:  temperature >100.4   Complete  by: As directed    Diet - low sodium heart healthy   Complete by: As directed    Discharge instructions   Complete by: As directed    Transfer to [name]--Dr. Fredderick [name] from [facility] transplant hepatology who accepted his case for transfer and potential evaluation for liver transplant--- - decompensated liver cirrhosis of unknown etiology (there is a question of [name]'s disease given abnormal copper testing in the past but it would be unusual given his age) and worsening CKD.   increasing MELD score   Increase activity slowly   Complete by: As directed    No wound care   Complete by: As directed          Discharge Medications     Allergies as of 12/02/2021   No Known Allergies      Medication List     STOP taking these medications    Accu-Chek Aviva Plus test strip Generic drug: glucose blood   acetaminophen 500 MG tablet Commonly known as: TYLENOL   aspirin 81 MG tablet  B-12 PO   brimonidine 0.2 % ophthalmic solution Commonly known as: ALPHAGAN   Fish Oil 1000 MG Caps   glipiZIDE 5 MG tablet Commonly known as: GLUCOTROL   isosorbide mononitrate 30 MG 24 hr tablet Commonly known as: IMDUR   metoprolol tartrate 25 MG tablet Commonly known as: LOPRESSOR   nitroGLYCERIN 0.4 MG SL tablet Commonly known as: NITROSTAT   Vitamin D3 50 MCG (2000 UT) Tabs       TAKE these medications    feeding supplement Liqd Take 237 mLs by mouth 2 (two) times daily between meals.   furosemide 10 MG/ML injection Commonly known as: LASIX Inject 4 mLs (40 mg total) into the vein 2 (two) times daily.   lactulose 10 GM/15ML solution Commonly known as: CHRONULAC Take 7.5 mLs (5 g total) by mouth daily.   megestrol 400 MG/10ML suspension Commonly known as: MEGACE Take 10 mLs (400 mg total) by mouth daily. Start taking on: December 03, 2021   midodrine 10 MG tablet Commonly known as: PROAMATINE Take 2 tablets (20 mg total) by mouth 3 (three) times daily  with meals.   octreotide 100 MCG/ML Soln injection Commonly known as: SANDOSTATIN Inject 1 mL (100 mcg total) into the skin 3 (three) times daily.   pantoprazole 40 MG tablet Commonly known as: PROTONIX Take 1 tablet (40 mg total) by mouth daily.   sodium bicarbonate 650 MG tablet Take 1 tablet (650 mg total) by mouth 3 (three) times daily.   tamsulosin 0.4 MG Caps capsule Commonly known as: FLOMAX Take 1 capsule (0.4 mg total) by mouth 2 (two) times daily.   traZODone 100 MG tablet Commonly known as: DESYREL Take 1 tablet (100 mg total) by mouth at bedtime.        Major procedures and Radiology Reports - PLEASE review detailed and final reports for all details, in brief -   DG CHEST PORT 1 VIEW  Result Date: 12/02/2021 CLINICAL DATA:  Shortness of breath EXAM: PORTABLE CHEST 1 VIEW COMPARISON:  11/27/2021 FINDINGS: Stable cardiomediastinal contours. Aortic atherosclerosis. Increased perihilar and bibasilar interstitial markings compared to prior. Probable small left pleural effusion. No pneumothorax. IMPRESSION: Increased perihilar and bibasilar interstitial markings compared to prior, suspect interstitial edema. Probable small left pleural effusion. Electronically Signed   By: Davina Poke D.O.   On: 12/02/2021 13:03   US Paracentesis  Result Date: 11/28/2021 INDICATION: Ascites EXAM: ULTRASOUND GUIDED DIAGNOSTIC AND THERAPEUTIC PARACENTESIS MEDICATIONS: None. COMPLICATIONS: None immediate. PROCEDURE: Informed written consent was obtained from the patient after a discussion of the risks, benefits and alternatives to treatment. A timeout was performed prior to the initiation of the procedure. Initial ultrasound scanning demonstrates a large amount of ascites within the right lower abdominal quadrant. The right lower abdomen was prepped and draped in the usual sterile fashion. 1% lidocaine was used for local anesthesia. Following this, a 19 gauge, 7-cm, Yueh catheter was  introduced. An ultrasound image was saved for documentation purposes. The paracentesis was performed. The catheter was removed and a dressing was applied. The patient tolerated the procedure well without immediate post procedural complication. FINDINGS: A total of approximately 3.4 L of yellow ascitic fluid was removed. Samples were sent to the laboratory as requested by the clinical team. IMPRESSION: Successful ultrasound-guided paracentesis yielding 3.4 liters of peritoneal fluid. Electronically Signed   By: Lavonia Dana M.D.   On: 11/28/2021 14:25   [place] EKG SITE RITE  Result Date: 11/28/2021 If Site Rite image not attached, placement could not  be confirmed due to current cardiac rhythm.  DG Chest Port 1 View  Result Date: 11/27/2021 CLINICAL DATA:  Shortness of breath EXAM: PORTABLE CHEST 1 VIEW COMPARISON:  11/21/2021 FINDINGS: Cardiac size is within normal limits. There are no signs of pulmonary edema. Linear densities are seen in both lower lung fields with slight improvement. No new focal infiltrates are seen. There is no pleural effusion or pneumothorax. IMPRESSION: Linear densities are seen in the lower lung fields suggesting atelectasis/pneumonia. There is interval improvement in the aeration of both lower lung fields. There are no new infiltrates or signs of pulmonary edema. Electronically Signed   By: Elmer Picker M.D.   On: 11/27/2021 15:57   CT ABDOMEN PELVIS W CONTRAST  Result Date: 11/24/2021 CLINICAL DATA:  Bowel obstruction suspected. Abdominal distension. Hypotension. EXAM: CT ABDOMEN AND PELVIS WITH CONTRAST TECHNIQUE: Multidetector CT imaging of the abdomen and pelvis was performed using the standard protocol following bolus administration of intravenous contrast. RADIATION DOSE REDUCTION: This exam was performed according to the departmental dose-optimization program which includes automated exposure control, adjustment of the mA and/or kV according to patient size and/or use  of iterative reconstruction technique. CONTRAST:  147m OMNIPAQUE IOHEXOL 300 MG/ML  SOLN COMPARISON:  CT 6 days ago 11/08/2021 FINDINGS: Lower chest: Small left pleural effusion and adjacent atelectasis, new from prior exam. Trace right pleural effusion. Heart is normal in size. There are paraesophageal varices. Hepatobiliary: Cirrhotic hepatic morphology. No focal hepatic lesion. Calcified gallstones. No obvious gallbladder wall thickening. There is no biliary dilatation. Pancreas: Parenchymal atrophy. No ductal dilatation or inflammation. Spleen: Again seen splenomegaly, greatest splenic dimension 13.5 cm. No focal splenic abnormality. Adrenals/Urinary Tract: No adrenal nodule. Mild bilateral renal atrophy. No hydronephrosis or focal renal abnormality. No renal calculi. Urinary bladder is minimally distended. Stomach/Bowel: Paraesophageal and perigastric varices. Assessment for bowel wall thickening is limited due to the presence of abdominal ascites. There is no abnormal bowel distension or obstruction. Normal appendix. Mild low-density wall thickening of the cecum and ascending colon. Left colonic diverticulosis, no focal diverticulitis. Vascular/Lymphatic: Aortic atherosclerosis without aneurysm. The portal vein is patent. Splenic vein is patent. Portosystemic collaterals noted in the abdomen. Paraesophageal and perigastric varices. No bulky abdominopelvic adenopathy. Reproductive: Prostate is unremarkable. Other: Moderate volume ascites with increase from prior exam. No free air. There is generalized body wall edema that has increased. No abdominal wall hernia. Musculoskeletal: There are no acute or suspicious osseous abnormalities. IMPRESSION: 1. No bowel obstruction. Mild low-density wall thickening of the cecum and ascending colon, may be due to portal colopathy. 2. Cirrhosis with portal hypertension and splenomegaly. Paraesophageal and perigastric varices. 3. Moderate volume ascites has increased.  Generalized body wall edema has increased. Small left pleural effusion and adjacent atelectasis, new from prior exam. Trace right pleural effusion. 4. Cholelithiasis without CT findings of acute cholecystitis. 5. Colonic diverticulosis without focal diverticulitis. Aortic Atherosclerosis (ICD10-I70.0). Electronically Signed   By: MKeith RakeM.D.   On: 11/24/2021 00:49   DG Chest 2 View  Result Date: 11/21/2021 CLINICAL DATA:  Shortness of breath and weakness. EXAM: CHEST - 2 VIEW COMPARISON:  Chest two views 09/14/2013, CT chest 11/18/2021 FINDINGS: Cardiac and mediastinal contours are within limits. Interval decrease in bilateral lung volumes compared to 09/14/2013 radiographs, however this is likely similar to 11/18/2021 CT. There are bibasilar opacities including horizontal linear septal thickening that was seen on prior CT. Minimal blunting of the bilateral costophrenic angles raises the question of tiny bilateral pleural effusions, however none  were seen on recent CT and this may be secondary to the peripheral inferior pulmonary fibrosis. No pneumothorax. Moderate multilevel degenerative disc changes of the midthoracic spine. IMPRESSION: Decreased lung volumes with bibasilar interlobular septal thickening corresponding to the mild interstitial pulmonary fibrosis seen on recent CT. Minimal blunting of the bilateral costophrenic angles raises the question of tiny bilateral pleural effusions, however as no pleural effusions were seen on recent CT, this may reflect the chronic interstitial scarring. Electronically Signed   By: Yvonne Kendall M.D.   On: 11/21/2021 12:58   [place] ASCITES (ABDOMEN LIMITED)  Result Date: 11/21/2021 CLINICAL DATA:  Elevated liver function tests.  Ascites. EXAM: LIMITED ABDOMEN ULTRASOUND FOR ASCITES TECHNIQUE: Limited ultrasound survey for ascites was performed in all four abdominal quadrants. COMPARISON:  11/18/2021 FINDINGS: Small amount of ascites is present in this patient  with morphologic findings of cirrhosis. The amount of ascites is currently felt to be below the volume threshold at which paracentesis would yield a significant therapeutic benefit. IMPRESSION: 1. Small amount of scattered ascites in the abdomen.  Cirrhosis. Electronically Signed   By: Van Clines M.D.   On: 11/21/2021 12:05   NM Hepato W/EF  Result Date: 11/20/2021 CLINICAL DATA:  Right upper quadrant abdominal pain x1 month, nondiagnostic ultrasound. EXAM: NUCLEAR MEDICINE HEPATOBILIARY IMAGING WITH GALLBLADDER EF TECHNIQUE: Sequential images of the abdomen were obtained out to 60 minutes following intravenous administration of radiopharmaceutical. After slow intravenous infusion of 1.5 micrograms Cholecystokinin, gallbladder ejection fraction was determined. RADIOPHARMACEUTICALS:  5.5 mCi Tc-83mCholetec IV COMPARISON:  Ultrasound November 18, 2021 FINDINGS: Delayed blood pool activity with delayed clearance of radiotracer from the liver. Prompt uptake and biliary excretion of activity by the liver is seen. Gallbladder activity is visualized, consistent with patency of cystic duct. Biliary activity passes into small bowel, consistent with patent common bile duct. There is an initial drop in radiotracer activity post CCK administration however there is a subsequent increase in activity towards the end of scintigraphic sequences. In the setting of delayed clearance of radiotracer from the liver. Calculated gallbladder ejection fraction of -56% (at 60 min, normal ejection fraction is greater than 40%.) IMPRESSION: 1. Delayed clearance of radiotracer from blood pool and the liver suggestive of hepatitis, which limits the utility of the study. 2. Reduced gallbladder ejection fraction, which is at least somewhat related to the delayed radiotracer clearance from the liver but is at least suggestive of chronic cholecystitis/biliary dyskinesia. Electronically Signed   By: JDahlia BailiffM.D.   On: 11/20/2021 13:06    [place]dom[place] Limited RUQ (LIVER/GB)  Result Date: 11/18/2021 CLINICAL DATA:  Abnormal LFTs. EXAM: ULTRASOUND ABDOMEN LIMITED RIGHT UPPER QUADRANT COMPARISON:  CT of the abdomen and pelvis 11/18/2021 FINDINGS: Gallbladder: Gallbladder wall is thickened measuring 5.3 mm. No sonographic MPercell Millersign is described. Shadowing gallstones measure up to 11 mm. Common bile duct: Diameter: 3.0 mm, within normal limits. Liver: Liver is shrunken within nodular appearance. Heterogeneous increased echogenicity noted. No discrete lesions are present. Portal vein is patent on color Doppler imaging with normal direction of blood flow towards the liver. Other: Moderate abdominal ascites is present. IMPRESSION: 1. Gallbladder wall thickening with cholelithiasis suggesting acute cholecystitis. 2. Findings suggestive of hepatic cirrhosis. 3. Ascites. Electronically Signed   By: CSan MorelleM.D.   On: 11/18/2021 12:01   CT CHEST ABDOMEN PELVIS WO CONTRAST  Result Date: 11/18/2021 CLINICAL DATA:  71year old male history of weakness. Metastatic disease. EXAM: CT CHEST, ABDOMEN AND PELVIS WITHOUT CONTRAST TECHNIQUE: Multidetector  CT imaging of the chest, abdomen and pelvis was performed following the standard protocol without IV contrast. RADIATION DOSE REDUCTION: This exam was performed according to the departmental dose-optimization program which includes automated exposure control, adjustment of the mA and/or kV according to patient size and/or use of iterative reconstruction technique. COMPARISON:  No priors. FINDINGS: CT CHEST FINDINGS Cardiovascular: Heart size is normal. There is no significant pericardial fluid, thickening or pericardial calcification. There is aortic atherosclerosis, as well as atherosclerosis of the great vessels of the mediastinum and the coronary arteries, including calcified atherosclerotic plaque in the left main, left anterior descending, left circumflex and right coronary arteries. No  pathologically enlarged lymph nodes. Mediastinum/Nodes: No pathologically enlarged mediastinal or hilar lymph nodes. Please note that accurate exclusion of hilar adenopathy is limited on noncontrast CT scans. Esophagus is mildly patulous and thickened without a discrete esophageal mass confidently identified. No axillary lymphadenopathy. Lungs/Pleura: 3 mm right upper lobe pulmonary nodule (axial image 79 of series 5). 5 mm right lower lobe pulmonary nodule (axial image 91 of series 5). No larger more suspicious appearing pulmonary nodules or masses are noted. No acute consolidative airspace disease. No pleural effusions. Patchy areas of peripheral predominant ground-glass attenuation, septal thickening and mild subpleural reticulation are noted throughout the mid to lower lungs bilaterally. No honeycombing. Musculoskeletal: There are no aggressive appearing lytic or blastic lesions noted in the visualized portions of the skeleton. CT ABDOMEN PELVIS FINDINGS Hepatobiliary: Liver has a shrunken appearance and nodular contour, indicative of underlying cirrhosis. No definite discrete cystic or solid hepatic lesions are confidently identified on today's noncontrast CT examination. Calcified gallstones lying dependently in the gallbladder. Gallbladder is moderately distended. Accurate assessment for pericholecystic fluid and inflammation is limited by presence of perihepatic ascites. Pancreas: No definite pancreatic mass. Haziness in the peripancreatic fat. No well-defined peripancreatic fluid collections. Spleen: Spleen is enlarged measuring 14.8 x 9.0 x 14.4 cm (estimated splenic volume of 959 mL) . Adrenals/Urinary Tract: Unenhanced appearance of the kidneys and bilateral adrenal glands is normal. No hydroureteronephrosis. Urinary bladder is unremarkable in appearance. Stomach/Bowel: Unenhanced appearance of the stomach is unremarkable. No pathologic dilatation of small bowel or colon. Numerous colonic diverticulae  are noted, particularly in the sigmoid colon. No definite focal surrounding inflammatory changes to clearly indicate acute diverticulitis are noted at this time (assessment is limited by presence of small volume of ascites). Appendix is not confidently identified. Vascular/Lymphatic: Atherosclerotic calcifications throughout the abdominal aorta and pelvic vasculature. Portal vein is dilated measuring 16 mm in the porta hepatis. No definite lymphadenopathy noted in the abdomen or pelvis on today's noncontrast CT examination. Reproductive: Prostate gland and seminal vesicles are unremarkable in appearance. Other: Small volume of ascites.  No pneumoperitoneum. Musculoskeletal: There are no aggressive appearing lytic or blastic lesions noted in the visualized portions of the skeleton. IMPRESSION: 1. Cirrhosis with small volume of ascites. Dilated portal vein and splenomegaly indicative of portal hypertension. 2. Small pulmonary nodules measuring 5 mm or less in size in the right lung, nonspecific, but statistically likely benign. No follow-up needed if patient is low-risk (and has no known or suspected primary neoplasm). Non-contrast chest CT can be considered in 12 months if patient is high-risk. This recommendation follows the consensus statement: Guidelines for Management of Incidental Pulmonary Nodules Detected on CT Images: From the Fleischner Society 2017; Radiology 2017; 284:228-243. 3. No other definite signs of potential metastatic disease noted elsewhere in the chest, abdomen or pelvis on today's noncontrast CT examination. 4. Esophagus appears mildly  patulous and diffusely thickened without discrete esophageal mass. Further evaluation with nonemergent endoscopy should be considered to better evaluate these findings in the near future. 5. Cholelithiasis without evidence of acute cholecystitis at this time. 6. Aortic atherosclerosis, in addition to left main and three-vessel coronary artery disease. Please note  that although the presence of coronary artery calcium documents the presence of coronary artery disease, the severity of this disease and any potential stenosis cannot be assessed on this non-gated CT examination. Assessment for potential risk factor modification, dietary therapy or pharmacologic therapy may be warranted, if clinically indicated. 7. Additional incidental findings, as above. Electronically Signed   By: Vinnie Langton M.D.   On: 11/18/2021 09:35   CT L-SPINE NO CHARGE  Result Date: 11/18/2021 CLINICAL DATA:  71 year old male being evaluated for metastatic disease. Weakness. Unable to walk. EXAM: CT LUMBAR SPINE WITHOUT CONTRAST TECHNIQUE: Technique: Multiplanar CT images of the lumbar spine were reconstructed from contemporary CT of the Abdomen and Pelvis. RADIATION DOSE REDUCTION: This exam was performed according to the departmental dose-optimization program which includes automated exposure control, adjustment of the mA and/or kV according to patient size and/or use of iterative reconstruction technique. CONTRAST:  None COMPARISON:  Thoracic spine CT, CT Chest, Abdomen, and Pelvis today reported separately. FINDINGS: Segmentation: Normal, concordant with the thoracic spine numbering today. Alignment: Normal lumbar lordosis. No spondylolisthesis. Minimal levoconvex lumbar scoliosis. Vertebrae: Bone mineralization is within normal limits for age. No lumbar vertebral fracture or lesion identified. Visible sacrum and SI joints appear intact. Paraspinal and other soft tissues: Abdominal and pelvic viscera are detailed separately. Lumbar paraspinal soft tissues are within normal limits. Disc levels: Age-appropriate lumbar spine degeneration. Disc bulging and endplate spurring with mild if any associated lumbar spinal stenosis. IMPRESSION: 1. Age-appropriate noncontrast CT appearance of the Lumbar Spine. 2.  CT Chest, Abdomen, and Pelvis today are reported separately. Electronically Signed   By: Genevie Ann M.D.   On: 11/18/2021 09:34   CT T-SPINE NO CHARGE  Result Date: 11/18/2021 CLINICAL DATA:  71 year old male being evaluated for metastatic disease. Weakness. Unable to walk. EXAM: CT THORACIC SPINE WITHOUT CONTRAST TECHNIQUE: Multiplanar CT images of the thoracic spine were reconstructed from contemporary CT of the Chest. RADIATION DOSE REDUCTION: This exam was performed according to the departmental dose-optimization program which includes automated exposure control, adjustment of the mA and/or kV according to patient size and/or use of iterative reconstruction technique. CONTRAST:  None COMPARISON:  CT cervical spine, CT Chest, Abdomen, and Pelvis today reported separately. FINDINGS: Limited cervical spine imaging:  Reported separately today. Thoracic spine segmentation:  Normal. Alignment: Normal for age thoracic kyphosis. No spondylolisthesis. No scoliosis. Vertebrae: Bone mineralization is within normal limits for age. No thoracic vertebral lesion by CT. No thoracic vertebral compression or fracture identified. Visible posterior ribs appear intact. Paraspinal and other soft tissues: Chest and abdominal viscera are reported separately. Thoracic paraspinal soft tissues are within normal limits. Disc levels: Age-appropriate thoracic spine degeneration. No CT evidence of thoracic spinal stenosis. Unremarkable noncontrast appearance of the thoracic spinal canal. IMPRESSION: 1. Normal for age noncontrast CT appearance of the Thoracic Spine. 2.  CT Chest, Abdomen, and Pelvis today are reported separately. Electronically Signed   By: Genevie Ann M.D.   On: 11/18/2021 09:30   CT HEAD WO CONTRAST (5MM)  Result Date: 11/18/2021 CLINICAL DATA:  71 year old male being evaluated for metastatic disease. Weakness. Unable to walk. EXAM: CT HEAD WITHOUT CONTRAST TECHNIQUE: Contiguous axial images were obtained from the  base of the skull through the vertex without intravenous contrast. RADIATION DOSE REDUCTION: This  exam was performed according to the departmental dose-optimization program which includes automated exposure control, adjustment of the mA and/or kV according to patient size and/or use of iterative reconstruction technique. COMPARISON:  None Available. FINDINGS: Brain: Fairly normal for age cerebral volume. No midline shift, ventriculomegaly, mass effect, evidence of mass lesion, intracranial hemorrhage or evidence of cortically based acute infarction. Gray-white matter differentiation is within normal limits throughout the brain. No encephalomalacia or cerebral edema identified. Vascular: Calcified atherosclerosis at the skull base. Skull: No acute or suspicious bone lesion identified. Sinuses/Orbits: Visualized paranasal sinuses and mastoids are well aerated. Trace bilateral mastoid air cell fluid. Small mucous retention cyst right maxillary sinus. Other: Negative visible nasopharynx. No suspicious orbit or scalp soft tissue finding. IMPRESSION: Negative for age noncontrast Head CT. Note that early metastatic disease to the brain is not excluded in the absence of intravenous contrast. Electronically Signed   By: Genevie Ann M.D.   On: 11/18/2021 09:26   CT CERVICAL SPINE WO CONTRAST  Result Date: 11/18/2021 CLINICAL DATA:  71 year old male being evaluated for metastatic disease. Weakness. Unable to walk. EXAM: CT CERVICAL SPINE WITHOUT CONTRAST TECHNIQUE: Multidetector CT imaging of the cervical spine was performed without intravenous contrast. Multiplanar CT image reconstructions were also generated. RADIATION DOSE REDUCTION: This exam was performed according to the departmental dose-optimization program which includes automated exposure control, adjustment of the mA and/or kV according to patient size and/or use of iterative reconstruction technique. COMPARISON:  Head CT reported separately. FINDINGS: Alignment: Mild straightening of cervical lordosis. Cervicothoracic junction alignment is within normal limits.  Bilateral posterior element alignment is within normal limits. Skull base and vertebrae: Bone mineralization is within normal limits for age. Visualized skull base is intact. No atlanto-occipital dissociation. No acute or suspicious osseous lesion identified in the cervical spine. Soft tissues and spinal canal: No prevertebral fluid or swelling. No visible canal hematoma. Negative noncontrast visible neck soft tissues except for calcified carotid atherosclerosis. Disc levels: Ordinary lower cervical disc and endplate degeneration, bulky at C6-C7, eccentric to the left, and associated with up to mild spinal stenosis at that level. But no other convincing cervical spinal stenosis by CT. There is associated moderate to severe left C7 proximal foraminal stenosis. Upper chest: Chest CT, thoracic spine CT reported separately. IMPRESSION: 1. No acute or metastatic process identified in the Cervical Spine by noncontrast CT. 2. Ordinary cervical spine degeneration at C6-C7 with suspected mild spinal stenosis and moderate to severe left C7 foraminal stenosis. Electronically Signed   By: Genevie Ann M.D.   On: 11/18/2021 09:24    Micro Results  Recent Results (from the past 240 hour(s))  Gram stain     Status: None   Collection Time: 11/28/21  1:50 PM   Specimen: Peritoneal Cavity  Result Value Ref Range Status   Specimen Description PERITONEAL CAVITY  Final   Special Requests PERITONEAL CAVITY  Final   Gram Stain   Final    WBC PRESENT, PREDOMINANTLY MONONUCLEAR NO ORGANISMS SEEN CYTOSPIN SMEAR Performed at [name], [address] [place], [place] [address]    Report Status 11/28/2021 FINAL  Final  Culture, body fluid w Gram Stain-bottle     Status: None (Preliminary result)   Collection Time: 11/28/21  1:50 PM   Specimen: Peritoneal Cavity  Result Value Ref Range Status   Specimen Description PERITONEAL CAVITY  Final   Special Requests   Final  BOTTLES DRAWN AEROBIC AND ANAEROBIC Blood Culture  adequate volume 10cc   Culture   Final    NO GROWTH 4 DAYS Performed at [facility], [address] [place], [place] [address]    Report Status PENDING  Incomplete   Today   Subjective   Jerick Khachatryan today has no new complaints  Appetite remains poor --        No fever  Or chills   No Nausea, Vomiting or Diarrhea -Transfer to [name]--Dr. [facility]der[name] from [name] transplant hepatology who accepted his case for transfer and potential evaluation for liver transplant--- - decompensated liver cirrhosis of unknown etiology (there is a question of [name]'s disease given abnormal copper testing in the past but it would be unusual given his age) and worsening CKD and increasing MELD score  Patient has been seen and examined prior to discharge   Objective   Blood pressure 93/61, pulse 65, temperature 98.1 F (36.7 C), resp. rate 16, height 5' 10"  (1.778 m), weight 84.1 kg, SpO2 97 %.   Intake/Output Summary (Last 24 hours) at 12/02/2021 1740 Last data filed at 12/02/2021 1100 Gross per 24 hour  Intake 360 ml  Output --  Net 360 ml   Exam Physical Examination: General appearance - alert,  in no distress, speaking in complete sentences Mental status - alert, oriented to person, place, and time,  Eyes - sclera anicteric Neck - supple, no JVD elevation , Chest -fair air movement bilaterally, no wheezing  heart - S1 and S2 normal, regular  Abdomen - soft, nontender, nondistended, +BS Neurological - screening mental status exam normal, neck supple without rigidity, cranial nerves II through XII intact, DTR's normal and symmetric Extremities -+2 pedal edema noted, intact peripheral pulses  Skin - warm, dry GU--scrotal  and penile edema   Data Review   CBC w Diff:  Lab Results  Component Value Date   WBC 4.4 12/02/2021   HGB 8.3 (L) 12/02/2021   HGB 12.2 (L) 11/14/2021   HCT 24.9 (L) 12/02/2021   HCT 35.5 (L) 11/14/2021   PLT 49 (L) 12/02/2021   PLT 61 (LL)  11/14/2021   LYMPHOPCT 18 11/27/2021   MONOPCT 9 11/27/2021   EOSPCT 1 11/27/2021   BASOPCT 0 11/27/2021   CMP:  Lab Results  Component Value Date   NA 135 12/02/2021   NA 137 11/14/2021   K 3.1 (L) 12/02/2021   CL 105 12/02/2021   CO2 21 (L) 12/02/2021   BUN 47 (H) 12/02/2021   BUN 18 11/14/2021   CREATININE 2.50 (H) 12/02/2021   CREATININE 2.16 (H) 10/07/2012   PROT 5.6 (L) 12/02/2021   PROT 6.0 11/14/2021   ALBUMIN 3.9 12/02/2021   ALBUMIN 3.4 (L) 11/14/2021   BILITOT 3.0 (H) 12/02/2021   BILITOT 2.8 (H) 11/14/2021   ALKPHOS 28 (L) 12/02/2021   AST 27 12/02/2021   ALT 30 12/02/2021  .  Total Discharge time is about 33 minutes  [name] M.D on 12/02/2021 at 5:40 PM  Go to [email or web address] -  for contact info  [facility] - Office  [phone]

## 2021-12-02 NOTE — Discharge Instructions (Signed)
Transfer to [facility]. [name] from [facility] transplant hepatology who accepted his case for transfer and potential evaluation for liver transplant--- - decompensated liver cirrhosis of unknown etiology (there is a question of Wilson's disease given abnormal copper testing in the past but it would be unusual given his age) and worsening CKD.   increasing MELD score

## 2021-12-02 NOTE — Progress Notes (Signed)
Spoke with Levada Dy with [facility] transport team. Pt will be discharged and transported to [facility] tomorrow 12/03/21 via [facility] Pt and family are aware. Report has not been given, per Levada Dy report should be given tomorrow before pt leaves. Number to call report is [phone] and pt is going to bed 6-A05 at [facility].

## 2021-12-02 NOTE — Progress Notes (Signed)
  Transfer to [facility]. [name] from [facility] transplant hepatology who accepted his case for transfer and potential evaluation for liver transplant--- - decompensated liver cirrhosis of unknown etiology (there is a question of Wilson's disease given abnormal copper testing in the past but it would be unusual given his age) and worsening CKD.   increasing MELD score  Roxan Hockey, MD

## 2021-12-02 NOTE — Progress Notes (Signed)
Pt has been pleasant throughout this writers shift. Has been ambulating to [facility] with 1 assist. No c/o pain or discomfort. Family members at bedside. Will continue to monitor.

## 2021-12-02 NOTE — Progress Notes (Signed)
[name] Progress Note   Assessment/ Plan:   # Cirrhosis with ascites - s/p paracentesis 11/28/21 with 3.4L off - s/p albumin challenge  -  midodrine  20 mg TID and continuing ocreotide for possible HRS - on cefriaxone and azithro (likely covering CAP and possible SBP but paracentesis looks noninfected) -  fluid restriction - GI working up for possible Wilson's disease--> copper levels high, going for liver biopsy - on lactulose   # AKI/ possible HRS - Ischemic and pre-renal insults with hypotension.  He has a history of recent IV contrast exposure on 7/20 for a CT scan.  Possible HRS complicating whole picture - s/p  albumin-  now on midodrine/ octreotide  - d/c Foley, bladder scan - Lasix 40 IV BID- increased on 7/27-  probably needs more but no change for now since sodium trended up - strict I and O BUN /crt no worse, maybe just a little better-  no change for now     # Hypoglycemia - previously needing dextrose IVF but now not   # Hyponatremia - trending in right direction after d5 stopped    # Anemia - macrocytic  - setting of cirrhosis  - supportive care  #CAP: - on azithro and ceftriaxone  #Hypokalemia: replete prn-  getting 80 total today  Pt will not be physically seen tomorrow-  call with questions-  will resume visits on Monday     Subjective:    Seen in room.  Still appears confused.  No UOP recorded but nursing says he is making-   weight down 0.9 kg from yesterday- BUN and crt down a little-  sodium up   Objective:   BP 93/61 (BP Location: Right Arm)   Pulse 65   Temp 98.1 F (36.7 C)   Resp 16   Ht '5\' 10"'$  (1.778 m)   Wt 84.1 kg   SpO2 97%   BMI 26.60 kg/m   Intake/Output Summary (Last 24 hours) at 12/02/2021 1241 Last data filed at 12/02/2021 0700 Gross per 24 hour  Intake 240 ml  Output --  Net 240 ml   Weight change: 2.1 kg  Physical Exam: Gen: NAD, pale CVS:RRR Resp:clear WEX:HBZJIRCVE with + fluid wave Ext: 3+ edema  to the flank  Imaging: No results found.  Labs: BMET Recent Labs  Lab 11/27/21 1500 11/28/21 0447 11/29/21 0456 11/30/21 0502 12/01/21 0448 12/02/21 0615  NA 131* 131* 131* 132* 132* 135  K 3.8 3.6 3.0* 3.7 3.2* 3.1*  CL 99 102 101 101 100 105  CO2 21* 20* 20* 21* 20* 21*  GLUCOSE 47* 55* 79 196* 168* 134*  BUN 47* 48* 44* 44* 48* 47*  CREATININE 2.64* 2.89* 2.79* 2.73* 2.77* 2.50*  CALCIUM 8.4* 7.8* 8.0* 8.2* 8.5* 8.4*   CBC Recent Labs  Lab 11/27/21 1500 11/28/21 1153 11/29/21 0456 11/30/21 0946 12/01/21 0448 12/02/21 0615  WBC 8.4   < > 3.7* 3.2* 4.9 4.4  NEUTROABS 6.0  --   --   --   --   --   HGB 11.6*   < > 8.5* 7.6* 7.9* 8.3*  HCT 34.0*   < > 25.7* 22.3* 24.2* 24.9*  MCV 98.6   < > 100.0 100.5* 103.4* 100.8*  PLT 97*   < > 54* 48* 46* 49*   < > = values in this interval not displayed.    Medications:     aspirin  81 mg Oral Daily   brimonidine  1 drop  Both Eyes BID   dextromethorphan-guaiFENesin  1 tablet Oral BID   furosemide  40 mg Intravenous BID   lactulose  20 g Oral BID   megestrol  400 mg Oral Daily   midodrine  20 mg Oral TID WC   octreotide  100 mcg Subcutaneous TID   pantoprazole  40 mg Oral Daily   sodium chloride flush  3 mL Intravenous Q12H   sodium chloride flush  3 mL Intravenous Q12H   tamsulosin  0.4 mg Oral BID   traZODone  100 mg Oral QHS    [name]  12/02/2021, 12:41 PM

## 2021-12-03 LAB — GLUCOSE, CAPILLARY: Glucose-Capillary: 158 mg/dL — ABNORMAL HIGH (ref 70–99)

## 2021-12-03 LAB — CULTURE, BODY FLUID W GRAM STAIN -BOTTLE: Culture: NO GROWTH

## 2021-12-03 NOTE — Progress Notes (Signed)
Vitals stable. Patient packet given to [place] life flight nurse.

## 2021-12-03 NOTE — Progress Notes (Signed)
Spoke to [employer] from [name]. Report was given. Per her they are 45 minutes away.

## 2021-12-03 NOTE — Progress Notes (Signed)
[facility] transport left with patient. Paper work was reviewed with transportation nursing staff from [place] and stated that all paper work was there and it looked correct.

## 2021-12-06 ENCOUNTER — Inpatient Hospital Stay (HOSPITAL_COMMUNITY): Payer: 59 | Admitting: Physician Assistant

## 2021-12-08 ENCOUNTER — Inpatient Hospital Stay (HOSPITAL_COMMUNITY): Payer: 59

## 2021-12-08 ENCOUNTER — Inpatient Hospital Stay (HOSPITAL_COMMUNITY)
Admission: RE | Admit: 2021-12-08 | Discharge: 2021-12-11 | DRG: 433 | Disposition: A | Discharge status: Home / Self Care | Payer: 59 | Source: Other Acute Inpatient Hospital | Attending: Family Medicine | Admitting: Family Medicine

## 2021-12-08 ENCOUNTER — Encounter (HOSPITAL_COMMUNITY): Payer: Self-pay

## 2021-12-08 DIAGNOSIS — E1121 Type 2 diabetes mellitus with diabetic nephropathy: Secondary | ICD-10-CM | POA: Diagnosis present

## 2021-12-08 DIAGNOSIS — Z87891 Personal history of nicotine dependence: Secondary | ICD-10-CM

## 2021-12-08 DIAGNOSIS — F5101 Primary insomnia: Secondary | ICD-10-CM

## 2021-12-08 DIAGNOSIS — E8809 Other disorders of plasma-protein metabolism, not elsewhere classified: Secondary | ICD-10-CM | POA: Diagnosis present

## 2021-12-08 DIAGNOSIS — I129 Hypertensive chronic kidney disease with stage 1 through stage 4 chronic kidney disease, or unspecified chronic kidney disease: Secondary | ICD-10-CM | POA: Diagnosis present

## 2021-12-08 DIAGNOSIS — E1122 Type 2 diabetes mellitus with diabetic chronic kidney disease: Secondary | ICD-10-CM | POA: Diagnosis present

## 2021-12-08 DIAGNOSIS — Z801 Family history of malignant neoplasm of trachea, bronchus and lung: Secondary | ICD-10-CM | POA: Diagnosis not present

## 2021-12-08 DIAGNOSIS — K7460 Unspecified cirrhosis of liver: Principal | ICD-10-CM | POA: Diagnosis present

## 2021-12-08 DIAGNOSIS — N5089 Other specified disorders of the male genital organs: Secondary | ICD-10-CM | POA: Diagnosis present

## 2021-12-08 DIAGNOSIS — Z79899 Other long term (current) drug therapy: Secondary | ICD-10-CM

## 2021-12-08 DIAGNOSIS — I252 Old myocardial infarction: Secondary | ICD-10-CM | POA: Diagnosis not present

## 2021-12-08 DIAGNOSIS — Z955 Presence of coronary angioplasty implant and graft: Secondary | ICD-10-CM

## 2021-12-08 DIAGNOSIS — Z833 Family history of diabetes mellitus: Secondary | ICD-10-CM

## 2021-12-08 DIAGNOSIS — E876 Hypokalemia: Secondary | ICD-10-CM | POA: Diagnosis present

## 2021-12-08 DIAGNOSIS — N1832 Chronic kidney disease, stage 3b: Secondary | ICD-10-CM | POA: Diagnosis present

## 2021-12-08 DIAGNOSIS — Z8249 Family history of ischemic heart disease and other diseases of the circulatory system: Secondary | ICD-10-CM | POA: Diagnosis not present

## 2021-12-08 DIAGNOSIS — R601 Generalized edema: Secondary | ICD-10-CM | POA: Diagnosis present

## 2021-12-08 DIAGNOSIS — R188 Other ascites: Secondary | ICD-10-CM | POA: Diagnosis present

## 2021-12-08 DIAGNOSIS — E8770 Fluid overload, unspecified: Secondary | ICD-10-CM | POA: Diagnosis present

## 2021-12-08 DIAGNOSIS — N183 Chronic kidney disease, stage 3 unspecified: Secondary | ICD-10-CM | POA: Diagnosis present

## 2021-12-08 DIAGNOSIS — N179 Acute kidney failure, unspecified: Secondary | ICD-10-CM | POA: Diagnosis present

## 2021-12-08 DIAGNOSIS — I2510 Atherosclerotic heart disease of native coronary artery without angina pectoris: Secondary | ICD-10-CM | POA: Diagnosis present

## 2021-12-08 DIAGNOSIS — E782 Mixed hyperlipidemia: Secondary | ICD-10-CM | POA: Diagnosis present

## 2021-12-08 LAB — COMPREHENSIVE METABOLIC PANEL
ALT: 43 U/L (ref 0–44)
AST: 64 U/L — ABNORMAL HIGH (ref 15–41)
Albumin: 3.4 g/dL — ABNORMAL LOW (ref 3.5–5.0)
Alkaline Phosphatase: 42 U/L (ref 38–126)
Anion gap: 10 (ref 5–15)
BUN: 21 mg/dL (ref 8–23)
CO2: 25 mmol/L (ref 22–32)
Calcium: 8.4 mg/dL — ABNORMAL LOW (ref 8.9–10.3)
Chloride: 101 mmol/L (ref 98–111)
Creatinine, Ser: 1.62 mg/dL — ABNORMAL HIGH (ref 0.61–1.24)
GFR, Estimated: 46 mL/min — ABNORMAL LOW (ref >60)
Glucose, Bld: 183 mg/dL — ABNORMAL HIGH (ref 70–99)
Potassium: 3.4 mmol/L — ABNORMAL LOW (ref 3.5–5.1)
Sodium: 136 mmol/L (ref 135–145)
Total Bilirubin: 2.8 mg/dL — ABNORMAL HIGH (ref 0.3–1.2)
Total Protein: 5.8 g/dL — ABNORMAL LOW (ref 6.5–8.1)

## 2021-12-08 LAB — CBC
HCT: 26.8 % — ABNORMAL LOW (ref 39.0–52.0)
Hemoglobin: 9 g/dL — ABNORMAL LOW (ref 13.0–17.0)
MCH: 34.6 pg — ABNORMAL HIGH (ref 26.0–34.0)
MCHC: 33.6 g/dL (ref 30.0–36.0)
MCV: 103.1 fL — ABNORMAL HIGH (ref 80.0–100.0)
Platelets: 60 10*3/uL — ABNORMAL LOW (ref 150–400)
RBC: 2.6 MIL/uL — ABNORMAL LOW (ref 4.22–5.81)
RDW: 17.1 % — ABNORMAL HIGH (ref 11.5–15.5)
WBC: 3.9 10*3/uL — ABNORMAL LOW (ref 4.0–10.5)
nRBC: 0 % (ref 0.0–0.2)

## 2021-12-08 LAB — MAGNESIUM: Magnesium: 2.3 mg/dL (ref 1.7–2.4)

## 2021-12-08 MED ORDER — TRAZODONE HCL 50 MG PO TABS
100.0000 mg | ORAL_TABLET | Freq: Every day | ORAL | Status: DC
  Administered 2021-12-08 – 2021-12-10 (×3): 100 mg via ORAL
  Filled 2021-12-08 (×3): qty 2

## 2021-12-08 MED ORDER — PANTOPRAZOLE SODIUM 40 MG PO TBEC
40.0000 mg | DELAYED_RELEASE_TABLET | Freq: Every day | ORAL | Status: DC
  Administered 2021-12-09 – 2021-12-11 (×3): 40 mg via ORAL
  Filled 2021-12-08 (×3): qty 1

## 2021-12-08 MED ORDER — TAMSULOSIN HCL 0.4 MG PO CAPS
0.4000 mg | ORAL_CAPSULE | Freq: Two times a day (BID) | ORAL | Status: DC
  Administered 2021-12-08 – 2021-12-11 (×6): 0.4 mg via ORAL
  Filled 2021-12-08 (×6): qty 1

## 2021-12-08 MED ORDER — ONDANSETRON HCL 4 MG PO TABS: 4.0000 mg | ORAL_TABLET | Freq: Four times a day (QID) | ORAL | Status: DC | PRN

## 2021-12-08 MED ORDER — MIDODRINE HCL 5 MG PO TABS
20.0000 mg | ORAL_TABLET | Freq: Three times a day (TID) | ORAL | Status: DC
Start: 2021-12-09 — End: 2021-12-11
  Administered 2021-12-09 – 2021-12-11 (×7): 20 mg via ORAL
  Filled 2021-12-08 (×7): qty 4

## 2021-12-08 MED ORDER — OXYCODONE HCL 5 MG PO TABS: 5.0000 mg | ORAL_TABLET | ORAL | Status: DC | PRN

## 2021-12-08 MED ORDER — ONDANSETRON HCL 4 MG/2ML IJ SOLN: 4.0000 mg | Freq: Four times a day (QID) | INTRAMUSCULAR | Status: DC | PRN

## 2021-12-08 MED ORDER — ACETAMINOPHEN 325 MG PO TABS: 650.0000 mg | ORAL_TABLET | Freq: Four times a day (QID) | ORAL | Status: DC | PRN

## 2021-12-08 MED ORDER — SODIUM BICARBONATE 650 MG PO TABS
650.0000 mg | ORAL_TABLET | Freq: Three times a day (TID) | ORAL | Status: DC
  Administered 2021-12-08 – 2021-12-11 (×8): 650 mg via ORAL
  Filled 2021-12-08 (×8): qty 1

## 2021-12-08 MED ORDER — MORPHINE SULFATE (PF) 2 MG/ML IV SOLN: 2.0000 mg | INTRAVENOUS | Status: DC | PRN

## 2021-12-08 MED ORDER — MEGESTROL ACETATE 400 MG/10ML PO SUSP
400.0000 mg | Freq: Every day | ORAL | Status: DC
  Administered 2021-12-09 – 2021-12-11 (×3): 400 mg via ORAL
  Filled 2021-12-08 (×3): qty 10

## 2021-12-08 MED ORDER — LACTULOSE 10 GM/15ML PO SOLN
5.0000 g | Freq: Every day | ORAL | Status: DC
  Administered 2021-12-09 – 2021-12-11 (×3): 5 g via ORAL
  Filled 2021-12-08 (×3): qty 30

## 2021-12-08 MED ORDER — FUROSEMIDE 10 MG/ML IJ SOLN
80.0000 mg | Freq: Two times a day (BID) | INTRAMUSCULAR | Status: DC
  Administered 2021-12-08 – 2021-12-10 (×5): 80 mg via INTRAVENOUS
  Filled 2021-12-08 (×5): qty 8

## 2021-12-08 MED ORDER — ENSURE ENLIVE PO LIQD
237.0000 mL | Freq: Two times a day (BID) | ORAL | Status: DC
  Administered 2021-12-09 – 2021-12-11 (×5): 237 mL via ORAL

## 2021-12-08 MED ORDER — ACETAMINOPHEN 650 MG RE SUPP: 650.0000 mg | Freq: Four times a day (QID) | RECTAL | Status: DC | PRN

## 2021-12-08 NOTE — Progress Notes (Signed)
Report given to this nurse from Thomos Lemons at [facility], no further questions at this time. Pt will arrive around 10pm tonight from [facility] transport. Report passed onto oncoming nurse Deanna, no further questions at this time.

## 2021-12-09 ENCOUNTER — Other Ambulatory Visit: Payer: Self-pay

## 2021-12-09 ENCOUNTER — Encounter (HOSPITAL_COMMUNITY): Payer: Self-pay | Admitting: Family Medicine

## 2021-12-09 DIAGNOSIS — N179 Acute kidney failure, unspecified: Secondary | ICD-10-CM

## 2021-12-09 DIAGNOSIS — E8770 Fluid overload, unspecified: Secondary | ICD-10-CM | POA: Diagnosis not present

## 2021-12-09 DIAGNOSIS — E1121 Type 2 diabetes mellitus with diabetic nephropathy: Secondary | ICD-10-CM

## 2021-12-09 DIAGNOSIS — R601 Generalized edema: Secondary | ICD-10-CM | POA: Diagnosis not present

## 2021-12-09 DIAGNOSIS — K7460 Unspecified cirrhosis of liver: Secondary | ICD-10-CM

## 2021-12-09 DIAGNOSIS — E876 Hypokalemia: Secondary | ICD-10-CM

## 2021-12-09 DIAGNOSIS — E8809 Other disorders of plasma-protein metabolism, not elsewhere classified: Secondary | ICD-10-CM

## 2021-12-09 DIAGNOSIS — R188 Other ascites: Secondary | ICD-10-CM

## 2021-12-09 LAB — CBC WITH DIFFERENTIAL/PLATELET
Abs Immature Granulocytes: 0.01 10*3/uL (ref 0.00–0.07)
Basophils Absolute: 0 10*3/uL (ref 0.0–0.1)
Basophils Relative: 1 %
Eosinophils Absolute: 0.1 10*3/uL (ref 0.0–0.5)
Eosinophils Relative: 4 %
HCT: 26.8 % — ABNORMAL LOW (ref 39.0–52.0)
Hemoglobin: 9 g/dL — ABNORMAL LOW (ref 13.0–17.0)
Immature Granulocytes: 0 %
Lymphocytes Relative: 37 %
Lymphs Abs: 1.2 10*3/uL (ref 0.7–4.0)
MCH: 33.7 pg (ref 26.0–34.0)
MCHC: 33.6 g/dL (ref 30.0–36.0)
MCV: 100.4 fL — ABNORMAL HIGH (ref 80.0–100.0)
Monocytes Absolute: 0.3 10*3/uL (ref 0.1–1.0)
Monocytes Relative: 8 %
Neutro Abs: 1.7 10*3/uL (ref 1.7–7.7)
Neutrophils Relative %: 50 %
Platelets: 65 10*3/uL — ABNORMAL LOW (ref 150–400)
RBC: 2.67 MIL/uL — ABNORMAL LOW (ref 4.22–5.81)
RDW: 17.2 % — ABNORMAL HIGH (ref 11.5–15.5)
WBC: 3.3 10*3/uL — ABNORMAL LOW (ref 4.0–10.5)
nRBC: 0 % (ref 0.0–0.2)

## 2021-12-09 LAB — GLUCOSE, CAPILLARY
Glucose-Capillary: 146 mg/dL — ABNORMAL HIGH (ref 70–99)
Glucose-Capillary: 174 mg/dL — ABNORMAL HIGH (ref 70–99)
Glucose-Capillary: 200 mg/dL — ABNORMAL HIGH (ref 70–99)
Glucose-Capillary: 195 mg/dL — ABNORMAL HIGH (ref 70–99)

## 2021-12-09 LAB — COMPREHENSIVE METABOLIC PANEL
ALT: 42 U/L (ref 0–44)
AST: 58 U/L — ABNORMAL HIGH (ref 15–41)
Albumin: 3.4 g/dL — ABNORMAL LOW (ref 3.5–5.0)
Alkaline Phosphatase: 37 U/L — ABNORMAL LOW (ref 38–126)
Anion gap: 9 (ref 5–15)
BUN: 21 mg/dL (ref 8–23)
CO2: 27 mmol/L (ref 22–32)
Calcium: 8.4 mg/dL — ABNORMAL LOW (ref 8.9–10.3)
Chloride: 99 mmol/L (ref 98–111)
Creatinine, Ser: 1.59 mg/dL — ABNORMAL HIGH (ref 0.61–1.24)
GFR, Estimated: 47 mL/min — ABNORMAL LOW (ref >60)
Glucose, Bld: 151 mg/dL — ABNORMAL HIGH (ref 70–99)
Potassium: 3 mmol/L — ABNORMAL LOW (ref 3.5–5.1)
Sodium: 135 mmol/L (ref 135–145)
Total Bilirubin: 3 mg/dL — ABNORMAL HIGH (ref 0.3–1.2)
Total Protein: 5.7 g/dL — ABNORMAL LOW (ref 6.5–8.1)

## 2021-12-09 LAB — MAGNESIUM: Magnesium: 1.9 mg/dL (ref 1.7–2.4)

## 2021-12-09 MED ORDER — POTASSIUM CHLORIDE CRYS ER 20 MEQ PO TBCR
20.0000 meq | EXTENDED_RELEASE_TABLET | Freq: Every evening | ORAL | Status: DC
  Administered 2021-12-09 – 2021-12-10 (×2): 20 meq via ORAL
  Filled 2021-12-09 (×2): qty 1

## 2021-12-09 MED ORDER — INSULIN ASPART 100 UNIT/ML IJ SOLN
0.0000 [IU] | Freq: Three times a day (TID) | INTRAMUSCULAR | Status: DC
  Administered 2021-12-09 – 2021-12-10 (×3): 1 [IU] via SUBCUTANEOUS
  Administered 2021-12-10: 2 [IU] via SUBCUTANEOUS
  Administered 2021-12-10: 4 [IU] via SUBCUTANEOUS
  Administered 2021-12-11: 2 [IU] via SUBCUTANEOUS
  Administered 2021-12-11: 1 [IU] via SUBCUTANEOUS

## 2021-12-09 MED ORDER — POTASSIUM CHLORIDE CRYS ER 20 MEQ PO TBCR
20.0000 meq | EXTENDED_RELEASE_TABLET | Freq: Once | ORAL | Status: AC
Start: 2021-12-09 — End: 2021-12-09
  Administered 2021-12-09: 20 meq via ORAL
  Filled 2021-12-09: qty 1

## 2021-12-09 MED ORDER — FENTANYL CITRATE PF 50 MCG/ML IJ SOSY: 12.5000 ug | PREFILLED_SYRINGE | INTRAMUSCULAR | Status: DC | PRN

## 2021-12-09 NOTE — Assessment & Plan Note (Addendum)
-   Secondary to decompensated liver cirrhosis - Continue midodrine 20 mg TID - He has diuresed at length on IV lasix and since creatinine has bumped we have stopped IV lasix and will give diuretic holiday today and start oral diuretics starting 8/8.    Filed Weights   12/09/21 1016 12/10/21 0500 12/11/21 0500  Weight: 75.2 kg 74.5 kg 74 kg    Intake/Output Summary (Last 24 hours) at 12/11/2021 1146 Last data filed at 12/11/2021 0327 Gross per 24 hour  Intake 580 ml  Output 1475 ml  Net -895 ml

## 2021-12-09 NOTE — H&P (Signed)
History and Physical    Patient: Edward [name] DOB: 06-Dec-1950 DOA: 12/08/2021 DOS: the patient was seen and examined on 12/09/2021 PCP: [name], FNP  Patient coming from: Home  Chief Complaint: No chief complaint on file.  HPI: Edward [name] is a 71 y.o. male with medical history significant of CKD, CAD, essential hypertension, liver cirrhosis, mixed hyperlipidemia, renal insufficiency, type 2 diabetes mellitus, and more presents as a transfer from 67 with a chief complaint of fluid overload.  Patient was recently discharged from this hospital on July 29.  At that time he was discharged to go to [facility] for liver transplant evaluation.  He was reportedly found not to be a liver transplant candidate.  His creatinine did improve while he was there.  He determined that he needs several more days of IV diuresis, but no longer requires a tertiary care center so they transferred him to [facility].  Patient reports he is feeling much better than his initial presentation.  He is agreeable to the plan of continue diuresis before he is ready for discharge. Patient does not smoke, does not drink, does not use illicit drugs.  He is vaccinated for COVID.  Patient is full code. Patient does have a history of smoking 35 years ago, and a history of heavy drinking more than a decade ago as well. Review of Systems: As mentioned in the history of present illness. All other systems reviewed and are negative. Past Medical History:  Diagnosis Date   Cataract    CKD (chronic kidney disease) stage 3, GFR 30-59 ml/min (HCC)    Coronary atherosclerosis of native coronary artery    BMS LAD 2006; residual 75% distal RCA; EF 50%   Essential hypertension    Glaucoma    Low back pain    MI (myocardial infarction) ([place])    Anterior 2006   Mixed hyperlipidemia    Renal insufficiency    Type 2 diabetes mellitus ([place])    Past Surgical History:  Procedure Laterality Date   CATARACT EXTRACTION,  BILATERAL     CORONARY ANGIOPLASTY WITH STENT PLACEMENT  2006   Social History:  reports that he quit smoking about 34 years ago. His smoking use included cigarettes. He has a 40.00 pack-year smoking history. He has never used smokeless tobacco. He reports that he does not drink alcohol and does not use drugs.  No Known Allergies  Family History  Problem Relation Age of Onset   Aneurysm Father        Brain aneurysm   Lung cancer Mother        Small cell carcinoma of lung,kidney and heart   Heart disease Mother    Cancer Brother        lungs   Diabetes Sister    Diabetes Sister    Diabetes Brother    Diabetes Brother     Prior to Admission medications   Medication Sig Start Date End Date Taking? Authorizing Provider  feeding supplement (ENSURE ENLIVE / ENSURE PLUS) LIQD Take 237 mLs by mouth 2 (two) times daily between meals. 11/22/21   Barton Dubois, MD  furosemide (LASIX) 10 MG/ML injection Inject 4 mLs (40 mg total) into the vein 2 (two) times daily. 12/02/21   Roxan Hockey, MD  lactulose (CHRONULAC) 10 GM/15ML solution Take 7.5 mLs (5 g total) by mouth daily. 11/23/21   Barton Dubois, MD  megestrol (MEGACE) 400 MG/10ML suspension Take 10 mLs (400 mg total) by mouth daily. 12/03/21  Roxan Hockey, MD  midodrine (PROAMATINE) 10 MG tablet Take 2 tablets (20 mg total) by mouth 3 (three) times daily with meals. 12/02/21   Roxan Hockey, MD  octreotide (SANDOSTATIN) 100 MCG/ML SOLN injection Inject 1 mL (100 mcg total) into the skin 3 (three) times daily. 12/02/21   Roxan Hockey, MD  pantoprazole (PROTONIX) 40 MG tablet Take 1 tablet (40 mg total) by mouth daily. 08/15/21   Hassell Done, Mary-Margaret, FNP  sodium bicarbonate 650 MG tablet Take 1 tablet (650 mg total) by mouth 3 (three) times daily. 11/22/21   Barton Dubois, MD  tamsulosin (FLOMAX) 0.4 MG CAPS capsule Take 1 capsule (0.4 mg total) by mouth 2 (two) times daily. 12/02/21   Roxan Hockey, MD  traZODone (DESYREL) 100  MG tablet Take 1 tablet (100 mg total) by mouth at bedtime. 08/15/21   [name], FNP    Physical Exam: Vitals:   12/08/21 2200 12/09/21 0349  BP: 110/60 (!) 109/55  Pulse: 66 74  Resp: 18 18  Temp: 97.7 F (36.5 C) 98.3 F (36.8 C)  TempSrc: Oral   SpO2: 98% 95%  Weight: 75.5 kg    1.  General: Patient lying supine in bed,  no acute distress   2. Psychiatric: Alert and oriented x 3, mood and behavior normal for situation, pleasant and cooperative with exam   3. Neurologic: Speech and language are normal, face is symmetric, moves all 4 extremities voluntarily, at baseline without acute deficits on limited exam   4. HEENMT:  Head is atraumatic, normocephalic, pupils reactive to light, neck is supple, trachea is midline, mucous membranes are moist   5. Respiratory : Lungs are clear to auscultation bilaterally without wheezing, rhonchi, rales, no cyanosis, no increase in work of breathing or accessory muscle use   6. Cardiovascular : Heart rate normal, rhythm is regular, cardiac murmur present, rubs or gallops, no pitting edema up to thigh present, peripheral pulses palpated   7. Gastrointestinal:  Abdomen is soft, slightly distended with fluid wave,, nontender to palpation bowel sounds active, no masses or organomegaly palpated   8. Skin:  Skin is warm, dry and intact without rashes, acute lesions, or ulcers on limited exam   9.Musculoskeletal:  No acute deformities or trauma, no asymmetry in tone, no peripheral edema, peripheral pulses palpated, no tenderness to palpation in the extremities  Data Reviewed: At admission Temp 97.7, heart rate 66, respiratory rate 18, blood pressure 118/60, satting at 98% Leukopenia at 3.9, hemoglobin stable 9.0, platelets low at 60 Chemistry shows a hypokalemia 3.4, elevated creatinine 1.62 Albumin  3.4 Chest x-ray shows improving lung aeration with diminishing pulmonary edema from prior exam.  Small suspected pleural  effusions EKG shows heart rate of 63, sinus rhythm, QTc 523 Last echo was July 2023 and showed an ejection fraction of 55% Admission requested for further management of fluid overload  Assessment and Plan: * Fluid overload - Secondary to decompensated liver cirrhosis - Continue midodrine - Monitor urine output and daily weights - Continue to monitor  AKI (acute kidney injury) ([place]) - Creatinine continues to improve with diuresis -Creatinine was as high as 2.89, but has improved to 1.62 which is very near to baseline -Continue IV diuresis -Continue to trend -Avoid nephrotoxic agents when possible  Anasarca - Patient still has pitting edema to the proximal thigh -Continue IV diuresis as recommended by [facility] -Continue to monitor  Decompensated cirrhosis of liver with ascites (HCC) - AST 64, ALT 43 - Patient was evaluated at [facility]  for liver transplant and found to not be a transplant candidate - This is the etiology behind the fluid overload - Continue IV Lasix - Patient may need to be set up for outpatient paracentesis as needed - Last paracentesis in our system was July 25 and had 3.4 L removed -Viral hepatitis profile was negative - Abnormal copper testing in the past - Continue to monitor  Hypoalbuminemia - Encourage nutrient dense food choices - Likely related to decompensated liver cirrhosis  Type 2 diabetes with nephropathy (HCC) Glucose on chemistry was 183 Patient was hypoglycemic at last admission Lowest dose sliding scale ordered with CBG monitoring      Advance Care Planning:   Code Status: Full Code   Consults:   Family Communication: No family at bedside  Severity of Illness: The appropriate patient status for this patient is INPATIENT. Inpatient status is judged to be reasonable and necessary in order to provide the required intensity of service to ensure the patient's safety. The patient's presenting symptoms, physical exam findings, and initial  radiographic and laboratory data in the context of their chronic comorbidities is felt to place them at high risk for further clinical deterioration. Furthermore, it is not anticipated that the patient will be medically stable for discharge from the hospital within 2 midnights of admission.   * I certify that at the point of admission it is my clinical judgment that the patient will require inpatient hospital care spanning beyond 2 midnights from the point of admission due to high intensity of service, high risk for further deterioration and high frequency of surveillance required.*  Author: Rolla Plate, DO 12/09/2021 5:02 AM  For on call review [email or web address].

## 2021-12-09 NOTE — Assessment & Plan Note (Addendum)
-   Creatinine continues to improve with diuresis -Creatinine was as high as 2.89, but has improved near baseline with IV diuresis -recheck BMP in 1 week when he follows up with PCP on 12/18/21

## 2021-12-09 NOTE — Assessment & Plan Note (Addendum)
-   Patient's edema is markedly improved - He was treated with several days of  IV diuresis lasix 80 mg IV BID and now plan to transition to oral diuretics - he will DC home on oral lasix 40 mg daily and spironolactone 12.5 mg daily

## 2021-12-09 NOTE — Progress Notes (Signed)
ASSUMPTION OF CARE NOTE   12/09/2021 1:18 PM  Edward [name] was seen and examined.  The H&P by the admitting provider, orders, imaging was reviewed.  Please see new orders.  Will continue to follow.   Vitals:   12/09/21 1016 12/09/21 1241  BP: (!) 109/55 (!) 104/57  Pulse: 74 90  Resp: 18 20  Temp: 98.3 F (36.8 C) 98.4 F (36.9 C)  SpO2:  97%    Results for orders placed or performed during the hospital encounter of 12/08/21  CBC  Result Value Ref Range   WBC 3.9 (L) 4.0 - 10.5 K/uL   RBC 2.60 (L) 4.22 - 5.81 MIL/uL   Hemoglobin 9.0 (L) 13.0 - 17.0 g/dL   HCT 26.8 (L) 39.0 - 52.0 %   MCV 103.1 (H) 80.0 - 100.0 fL   MCH 34.6 (H) 26.0 - 34.0 pg   MCHC 33.6 30.0 - 36.0 g/dL   RDW 17.1 (H) 11.5 - 15.5 %   Platelets 60 (L) 150 - 400 K/uL   nRBC 0.0 0.0 - 0.2 %  Comprehensive metabolic panel  Result Value Ref Range   Sodium 136 135 - 145 mmol/L   Potassium 3.4 (L) 3.5 - 5.1 mmol/L   Chloride 101 98 - 111 mmol/L   CO2 25 22 - 32 mmol/L   Glucose, Bld 183 (H) 70 - 99 mg/dL   BUN 21 8 - 23 mg/dL   Creatinine, Ser 1.62 (H) 0.61 - 1.24 mg/dL   Calcium 8.4 (L) 8.9 - 10.3 mg/dL   Total Protein 5.8 (L) 6.5 - 8.1 g/dL   Albumin 3.4 (L) 3.5 - 5.0 g/dL   AST 64 (H) 15 - 41 U/L   ALT 43 0 - 44 U/L   Alkaline Phosphatase 42 38 - 126 U/L   Total Bilirubin 2.8 (H) 0.3 - 1.2 mg/dL   GFR, Estimated 46 (L) >60 mL/min   Anion gap 10 5 - 15  Magnesium  Result Value Ref Range   Magnesium 2.3 1.7 - 2.4 mg/dL  Comprehensive metabolic panel  Result Value Ref Range   Sodium 135 135 - 145 mmol/L   Potassium 3.0 (L) 3.5 - 5.1 mmol/L   Chloride 99 98 - 111 mmol/L   CO2 27 22 - 32 mmol/L   Glucose, Bld 151 (H) 70 - 99 mg/dL   BUN 21 8 - 23 mg/dL   Creatinine, Ser 1.59 (H) 0.61 - 1.24 mg/dL   Calcium 8.4 (L) 8.9 - 10.3 mg/dL   Total Protein 5.7 (L) 6.5 - 8.1 g/dL   Albumin 3.4 (L) 3.5 - 5.0 g/dL   AST 58 (H) 15 - 41 U/L   ALT 42 0 - 44 U/L   Alkaline Phosphatase 37 (L) 38 - 126 U/L    Total Bilirubin 3.0 (H) 0.3 - 1.2 mg/dL   GFR, Estimated 47 (L) >60 mL/min   Anion gap 9 5 - 15  Magnesium  Result Value Ref Range   Magnesium 1.9 1.7 - 2.4 mg/dL  CBC with Differential/Platelet  Result Value Ref Range   WBC 3.3 (L) 4.0 - 10.5 K/uL   RBC 2.67 (L) 4.22 - 5.81 MIL/uL   Hemoglobin 9.0 (L) 13.0 - 17.0 g/dL   HCT 26.8 (L) 39.0 - 52.0 %   MCV 100.4 (H) 80.0 - 100.0 fL   MCH 33.7 26.0 - 34.0 pg   MCHC 33.6 30.0 - 36.0 g/dL   RDW 17.2 (H) 11.5 - 15.5 %   Platelets 65 (  L) 150 - 400 K/uL   nRBC 0.0 0.0 - 0.2 %   Neutrophils Relative % 50 %   Neutro Abs 1.7 1.7 - 7.7 K/uL   Lymphocytes Relative 37 %   Lymphs Abs 1.2 0.7 - 4.0 K/uL   Monocytes Relative 8 %   Monocytes Absolute 0.3 0.1 - 1.0 K/uL   Eosinophils Relative 4 %   Eosinophils Absolute 0.1 0.0 - 0.5 K/uL   Basophils Relative 1 %   Basophils Absolute 0.0 0.0 - 0.1 K/uL   Immature Granulocytes 0 %   Abs Immature Granulocytes 0.01 0.00 - 0.07 K/uL  Glucose, capillary  Result Value Ref Range   Glucose-Capillary 146 (H) 70 - 99 mg/dL  Glucose, capillary  Result Value Ref Range   Glucose-Capillary 174 (H) 70 - 99 mg/dL     Murvin Natal, MD [facility]   12/08/2021  9:58 PM How to contact the [facility] Attending or Consulting provider [place] or covering provider during after hours [place], for this patient?  Check the care team in [facility] and look for a) attending/consulting TRH provider listed and b) the [facility] team listed Log into [email or web address] and use [place]'s universal password to access. If you do not have the password, please contact the hospital operator. Locate the [facility] provider you are looking for under [facility] and page to a number that you can be directly reached. If you still have difficulty reaching the provider, please page the [facility] (Director on Call) for the Hospitalists listed on amion for assistance.

## 2021-12-09 NOTE — Assessment & Plan Note (Signed)
-   Encourage nutrient dense food choices - Likely related to decompensated liver cirrhosis

## 2021-12-09 NOTE — Assessment & Plan Note (Addendum)
Repleted. °

## 2021-12-09 NOTE — Assessment & Plan Note (Addendum)
Glucose on chemistry was 183 Patient was hypoglycemic at last admission Lowest dose sliding scale ordered with CBG monitoring CBG (last 3)  Recent Labs    12/10/21 2008 12/11/21 0730 12/11/21 1127  GLUCAP 266* 182* 248*   -- dc home on oral linagliptin 5 mg daily

## 2021-12-09 NOTE — TOC Progression Note (Signed)
Transition of Care [facility]) - Progression Note    Patient Details  Name: Edward [name] MRN: [id number] Date of Birth: July 13, 1950  Transition of Care [facility]) CM/SW Contact  Salome Arnt, [place] Phone Number: 12/09/2021, 9:17 AM  Clinical Narrative:   Transition of Care [facility]) Screening Note   Patient Details  Name: [name] Date of Birth: Oct 02, 1950   Transition of Care [facility]) CM/SW Contact:    Salome Arnt, [place] Phone Number: 12/09/2021, 9:17 AM    Transition of Care Department [facility]) has reviewed patient and no TOC needs have been identified at this time. We will continue to monitor patient advancement through interdisciplinary progression rounds. If new patient transition needs arise, please place a TOC consult.            Expected Discharge Plan and Services                                                 Social Determinants of Health (SDOH) Interventions    Readmission Risk Interventions    11/28/2021    4:08 PM 11/22/2021    2:53 PM  Readmission Risk Prevention Plan  Transportation Screening Complete Complete  PCP or Specialist Appt within 3-5 Days Not Complete   Home Care Screening  Patient refused  Medication Review (RN CM)  Complete  HRI or Home Care Consult Complete   Social Work Consult for [place] Planning/Counseling Complete   Palliative Care Screening Complete   Medication Review Press photographer) Complete

## 2021-12-09 NOTE — Assessment & Plan Note (Addendum)
-   AST 64, ALT 43 - Patient was evaluated at [facility] for liver transplant and found to not be a transplant candidate.  They referred him to hepatologist at [facility] - This is the etiology behind the massive fluid overload - Cmpleted IV Lasix - Patient may need to be set up for outpatient paracentesis as needed - Last paracentesis in our system was July 25 and had 3.4 L removed -Viral hepatitis profile was negative - Abnormal copper testing in the past - Outpatient follow up with Roseanne Kaufman scheduled soon --the patient needs outpatient EGD to be arranged by [facility] on outpatient follow up.  -- he will start oral lasix 40 mg daily and spironolactone 12.5 mg daily.  Lactulose 15 mL BID.  He will continue ciprofloxacin 500 mg Daily for SBP prophylaxis.

## 2021-12-10 DIAGNOSIS — E8770 Fluid overload, unspecified: Secondary | ICD-10-CM | POA: Diagnosis not present

## 2021-12-10 DIAGNOSIS — K7460 Unspecified cirrhosis of liver: Secondary | ICD-10-CM | POA: Diagnosis not present

## 2021-12-10 DIAGNOSIS — R601 Generalized edema: Secondary | ICD-10-CM | POA: Diagnosis not present

## 2021-12-10 DIAGNOSIS — N179 Acute kidney failure, unspecified: Secondary | ICD-10-CM | POA: Diagnosis not present

## 2021-12-10 LAB — GLUCOSE, CAPILLARY
Glucose-Capillary: 161 mg/dL — ABNORMAL HIGH (ref 70–99)
Glucose-Capillary: 227 mg/dL — ABNORMAL HIGH (ref 70–99)
Glucose-Capillary: 310 mg/dL — ABNORMAL HIGH (ref 70–99)
Glucose-Capillary: 266 mg/dL — ABNORMAL HIGH (ref 70–99)

## 2021-12-10 MED ORDER — CIPROFLOXACIN HCL 250 MG PO TABS
500.0000 mg | ORAL_TABLET | Freq: Every day | ORAL | Status: DC
  Administered 2021-12-10: 500 mg via ORAL
  Filled 2021-12-10: qty 2

## 2021-12-10 MED ORDER — BRIMONIDINE TARTRATE 0.15 % OP SOLN
1.0000 [drp] | Freq: Two times a day (BID) | OPHTHALMIC | Status: DC
  Administered 2021-12-10 – 2021-12-11 (×2): 1 [drp] via OPHTHALMIC
  Filled 2021-12-10: qty 5

## 2021-12-10 MED ORDER — CIPROFLOXACIN HCL 250 MG PO TABS: 500.0000 mg | ORAL_TABLET | Freq: Every day | ORAL | Status: DC

## 2021-12-10 NOTE — Progress Notes (Signed)
PROGRESS NOTE   Edward [name] [facility]  IWL:[id number] DOB: 1950-09-14 DOA: 12/08/2021 PCP: [name], FNP   No chief complaint on file.  Level of care: Telemetry  Brief Admission History:  71 y.o. male with medical history significant of CKD, CAD, essential hypertension, liver cirrhosis, mixed hyperlipidemia, renal insufficiency, type 2 diabetes mellitus, and more presents as a transfer from [facility] with a chief complaint of fluid overload.  Patient was recently discharged from this hospital on July 29.  At that time he was discharged to go to [facility] for liver transplant evaluation.  He was reportedly found not to be a liver transplant candidate.  His creatinine did improve while he was there.  He determined that he needs several more days of IV diuresis, but no longer requires a tertiary care center so they transferred him to [facility].  Patient reports he is feeling much better than his initial presentation.  He is agreeable to the plan of continue diuresis before he is ready for discharge.  Patient does not smoke, does not drink, does not use illicit drugs.  He is vaccinated for COVID.  Patient is full code.  Patient does have a history of smoking 35 years ago, and a history of heavy drinking more than a decade ago as well.  Assessment and Plan: * Fluid overload - Secondary to decompensated liver cirrhosis - Continue midodrine - Monitor urine output and daily weights - Continue to monitor  Hypokalemia Repleted   AKI (acute kidney injury) ([place]) - Creatinine continues to improve with diuresis -Creatinine was as high as 2.89, but has improved to 1.62 which is very near baseline -Continue IV diuresis -Avoid nephrotoxic agents when possible  Hypoalbuminemia - Encourage nutrient dense food choices - Likely related to decompensated liver cirrhosis  Anasarca - Patient's edema is markedly improved - Continue IV diuresis with plan to transition to oral diuretics tomorrow  Decompensated  cirrhosis of liver with ascites (HCC) - AST 64, ALT 43 - Patient was evaluated at [facility] for liver transplant and found to not be a transplant candidate - This is the etiology behind the fluid overload - Continue IV Lasix - Patient may need to be set up for outpatient paracentesis as needed - Last paracentesis in our system was July 25 and had 3.4 L removed -Viral hepatitis profile was negative - Abnormal copper testing in the past - Continue to monitor  Type 2 diabetes with nephropathy (HCC) Glucose on chemistry was 183 Patient was hypoglycemic at last admission Lowest dose sliding scale ordered with CBG monitoring CBG (last 3)  Recent Labs    12/09/21 2151 12/10/21 0749 12/10/21 1134  GLUCAP 195* 161* 227*     DVT prophylaxis:  TED hoses  Code Status: Full  Family Communication:  Disposition: Status is: Inpatient Remains inpatient appropriate because: IV lasix for diuresis    Consultants:   Procedures:   Antimicrobials:    Subjective: Pt reports that he is feeling much better and edema in scrotum and legs is much better.   Objective: Vitals:   12/09/21 1241 12/09/21 2153 12/10/21 0403 12/10/21 0500  BP: (!) 104/57 (!) 104/50 (!) 103/55   Pulse: 90 78 87   Resp: '20 18 18   '$ Temp: 98.4 F (36.9 C) 98.8 F (37.1 C) 98.6 F (37 C)   TempSrc: Oral     SpO2: 97% 96% 96%   Weight:    74.5 kg  Height:        Intake/Output Summary (Last 24 hours)  at 12/10/2021 1147 Last data filed at 12/10/2021 0900 Gross per 24 hour  Intake 1080 ml  Output 1375 ml  Net -295 ml   Filed Weights   12/09/21 0500 12/09/21 1016 12/10/21 0500  Weight: 75.2 kg 75.2 kg 74.5 kg   Examination:  General exam: Appears calm and comfortable  Respiratory system: Clear to auscultation. Respiratory effort normal. Cardiovascular system: normal S1 & S2 heard. No JVD, murmurs, rubs, gallops or clicks. 1+ pedal edema. Gastrointestinal system: Abdomen is mildly distended, soft and nontender. No  organomegaly or masses felt. Normal bowel sounds heard. Central nervous system: Alert and oriented. No focal neurological deficits. GU: scrotal edema markedly improved.  Extremities: TED hoses to bilateral LEs.  Skin: No rashes, lesions or ulcers. Psychiatry: Judgement and insight appear normal. Mood & affect appropriate.   Data Reviewed: I have personally reviewed following labs and imaging studies  CBC: Recent Labs  Lab 12/08/21 2312 12/09/21 0459  WBC 3.9* 3.3*  NEUTROABS  --  1.7  HGB 9.0* 9.0*  HCT 26.8* 26.8*  MCV 103.1* 100.4*  PLT 60* 65*    Basic Metabolic Panel: Recent Labs  Lab 12/08/21 2312 12/09/21 0459  NA 136 135  K 3.4* 3.0*  CL 101 99  CO2 25 27  GLUCOSE 183* 151*  BUN 21 21  CREATININE 1.62* 1.59*  CALCIUM 8.4* 8.4*  MG 2.3 1.9    CBG: Recent Labs  Lab 12/09/21 1143 12/09/21 1634 12/09/21 2151 12/10/21 0749 12/10/21 1134  GLUCAP 174* 200* 195* 161* 227*    No results found for this or any previous visit (from the past 240 hour(s)).   Radiology Studies: DG CHEST PORT 1 VIEW  Result Date: 12/08/2021 CLINICAL DATA:  Fluid overload. EXAM: PORTABLE CHEST 1 VIEW COMPARISON:  Radiograph 12/02/2021.  CT 11/18/2021 FINDINGS: Improved aeration from prior exam. Heart is upper normal in size. Stable mediastinal contours. Improving pulmonary edema from prior. Small suspected pleural effusions. No confluent consolidation. No pneumothorax. Stable osseous structures. IMPRESSION: 1. Improving lung aeration with diminishing pulmonary edema from prior exam. 2. Small suspected pleural effusions. Electronically Signed   By: Keith Rake M.D.   On: 12/08/2021 23:25    Scheduled Meds:  feeding supplement  237 mL Oral BID BM   furosemide  80 mg Intravenous Q12H   insulin aspart  0-6 Units Subcutaneous TID WC   lactulose  5 g Oral Daily   megestrol  400 mg Oral Daily   midodrine  20 mg Oral TID WC   pantoprazole  40 mg Oral Daily   potassium chloride  20  mEq Oral QPM   sodium bicarbonate  650 mg Oral TID   tamsulosin  0.4 mg Oral BID   traZODone  100 mg Oral QHS   Continuous Infusions:   LOS: 2 days   Time spent: 35 mins  Rupinder Livingston Wynetta Emery, MD How to contact the [facility] Attending or Consulting provider [place] or covering provider during after hours [place], for this patient?  Check the care team in [facility] and look for a) attending/consulting TRH provider listed and b) the [facility] team listed Log into [email or web address] and use [place]'s universal password to access. If you do not have the password, please contact the hospital operator. Locate the [facility] provider you are looking for under [facility] and page to a number that you can be directly reached. If you still have difficulty reaching the provider, please page the [facility] (Director on Call) for the Hospitalists listed  on amion for assistance.  12/10/2021, 11:47 AM

## 2021-12-11 DIAGNOSIS — E8770 Fluid overload, unspecified: Secondary | ICD-10-CM | POA: Diagnosis not present

## 2021-12-11 DIAGNOSIS — K7460 Unspecified cirrhosis of liver: Secondary | ICD-10-CM | POA: Diagnosis not present

## 2021-12-11 DIAGNOSIS — N179 Acute kidney failure, unspecified: Secondary | ICD-10-CM | POA: Diagnosis not present

## 2021-12-11 DIAGNOSIS — R601 Generalized edema: Secondary | ICD-10-CM | POA: Diagnosis not present

## 2021-12-11 LAB — BASIC METABOLIC PANEL
Anion gap: 10 (ref 5–15)
BUN: 26 mg/dL — ABNORMAL HIGH (ref 8–23)
CO2: 29 mmol/L (ref 22–32)
Calcium: 8.6 mg/dL — ABNORMAL LOW (ref 8.9–10.3)
Chloride: 98 mmol/L (ref 98–111)
Creatinine, Ser: 2.01 mg/dL — ABNORMAL HIGH (ref 0.61–1.24)
GFR, Estimated: 35 mL/min — ABNORMAL LOW (ref >60)
Glucose, Bld: 212 mg/dL — ABNORMAL HIGH (ref 70–99)
Potassium: 3.7 mmol/L (ref 3.5–5.1)
Sodium: 137 mmol/L (ref 135–145)

## 2021-12-11 LAB — MAGNESIUM: Magnesium: 1.6 mg/dL — ABNORMAL LOW (ref 1.7–2.4)

## 2021-12-11 LAB — GLUCOSE, CAPILLARY
Glucose-Capillary: 182 mg/dL — ABNORMAL HIGH (ref 70–99)
Glucose-Capillary: 248 mg/dL — ABNORMAL HIGH (ref 70–99)

## 2021-12-11 MED ORDER — MIDODRINE HCL 10 MG PO TABS: 20.0000 mg | ORAL_TABLET | Freq: Three times a day (TID) | ORAL | 1 refills | Status: DC

## 2021-12-11 MED ORDER — FUROSEMIDE 40 MG PO TABS: 40.0000 mg | ORAL_TABLET | Freq: Every day | ORAL | 1 refills | Status: DC

## 2021-12-11 MED ORDER — LACTULOSE 10 GM/15ML PO SOLN: 10.0000 g | Freq: Two times a day (BID) | ORAL | 1 refills | Status: DC

## 2021-12-11 MED ORDER — TAMSULOSIN HCL 0.4 MG PO CAPS: 0.4000 mg | ORAL_CAPSULE | Freq: Every day | ORAL | 1 refills | Status: DC

## 2021-12-11 MED ORDER — CIPROFLOXACIN HCL 500 MG PO TABS: 500.0000 mg | ORAL_TABLET | Freq: Every day | ORAL | 0 refills | Status: DC

## 2021-12-11 MED ORDER — TRAZODONE HCL 50 MG PO TABS: 50.0000 mg | ORAL_TABLET | Freq: Every day | ORAL | 1 refills | Status: DC

## 2021-12-11 MED ORDER — SPIRONOLACTONE 25 MG PO TABS: 12.5000 mg | ORAL_TABLET | Freq: Every day | ORAL | 1 refills | Status: DC

## 2021-12-11 MED ORDER — POTASSIUM CHLORIDE CRYS ER 10 MEQ PO TBCR: 10.0000 meq | EXTENDED_RELEASE_TABLET | Freq: Every day | ORAL | 1 refills | Status: DC

## 2021-12-11 MED ORDER — MAGNESIUM SULFATE 4 GM/100ML IV SOLN
4.0000 g | Freq: Once | INTRAVENOUS | Status: AC
  Administered 2021-12-11: 4 g via INTRAVENOUS
  Filled 2021-12-11: qty 100

## 2021-12-11 MED ORDER — LINAGLIPTIN 5 MG PO TABS: 5.0000 mg | ORAL_TABLET | Freq: Every day | ORAL | 1 refills | Status: DC

## 2021-12-11 NOTE — Evaluation (Signed)
Physical Therapy Evaluation Patient Details Name: Edward [name] MRN: [id number] DOB: [birth year] Today's Date: 12/11/2021  History of Present Illness  Edward [name] is a 71 y.o. male with medical history significant of CKD, CAD, essential hypertension, liver cirrhosis, mixed hyperlipidemia, renal insufficiency, type 2 diabetes mellitus, and more presents as a transfer from 67 with a chief complaint of fluid overload.  Patient was recently discharged from this hospital on July 29.  At that time he was discharged to go to [facility] for liver transplant evaluation.  He was reportedly found not to be a liver transplant candidate.  His creatinine did improve while he was there.  He determined that he needs several more days of IV diuresis, but no longer requires a tertiary care center so they transferred him to [facility].  Patient reports he is feeling much better than his initial presentation.  He is agreeable to the plan of continue diuresis before he is ready for discharge.  Patient does not smoke, does not drink, does not use illicit drugs.  He is vaccinated for COVID.  Patient is full code.  Patient does have a history of smoking 35 years ago, and a history of heavy drinking more than a decade ago as well.   Clinical Impression  Patient functioning at baseline for functional mobility and gait demonstrating good return for bed mobility, transfers and ambulation in room/hallway without loss of balance.  Plan:  Patient discharged from physical therapy to care of nursing for ambulation daily as tolerated for length of stay.         Recommendations for follow up therapy are one component of a multi-disciplinary discharge planning process, led by the attending physician.  Recommendations may be updated based on patient status, additional functional criteria and insurance authorization.  Follow Up Recommendations        Assistance Recommended at Discharge None  Patient can return home with the following   Assistance with cooking/housework    Equipment Recommendations None recommended by PT  Recommendations for Other Services       Functional Status Assessment Patient has not had a recent decline in their functional status     Precautions / Restrictions Precautions Precautions: None Restrictions Weight Bearing Restrictions: No      Mobility  Bed Mobility Overal bed mobility: Modified Independent                  Transfers Overall transfer level: Modified independent                      Ambulation/Gait Ambulation/Gait assistance: Modified independent (Device/Increase time) Gait Distance (Feet): 150 Feet Assistive device: None Gait Pattern/deviations: Decreased step length - right, WFL(Within Functional Limits) Gait velocity: decreased     General Gait Details: grossly WFL other excessive external rotation RLE which is baseline per patient, good return for ambulation in room and hallway without loss of balance  Stairs            Wheelchair Mobility    Modified Rankin (Stroke Patients Only)       Balance Overall balance assessment: Needs assistance Sitting-balance support: Feet supported, No upper extremity supported Sitting balance-Leahy Scale: Good Sitting balance - Comments: good   Standing balance support: During functional activity, No upper extremity supported Standing balance-Leahy Scale: Good Standing balance comment: without AD  Pertinent Vitals/Pain Pain Assessment Pain Assessment: No/denies pain    Home Living Family/patient expects to be discharged to:: Private residence Living Arrangements: Spouse/significant other Available Help at Discharge: Family;Available PRN/intermittently Type of Home: House Home Access: Stairs to enter Entrance Stairs-Rails: None Entrance Stairs-Number of Steps: 1   Home Layout: One level Home Equipment: Shower seat      Prior Function Prior Level of  Function : Independent/Modified Independent             Mobility Comments: Patient reports being able to ambulate at home and in the community independently with no AD. Currently driving ADLs Comments: Patient reports being indepenent with ADL's.     Hand Dominance        Extremity/Trunk Assessment   Upper Extremity Assessment Upper Extremity Assessment: Overall WFL for tasks assessed    Lower Extremity Assessment Lower Extremity Assessment: Overall WFL for tasks assessed    Cervical / Trunk Assessment Cervical / Trunk Assessment: Normal  Communication   Communication: No difficulties  Cognition Arousal/Alertness: Awake/alert Behavior During Therapy: WFL for tasks assessed/performed Overall Cognitive Status: Within Functional Limits for tasks assessed                                          General Comments      Exercises     Assessment/Plan    PT Assessment Patient does not need any further PT services  PT Problem List         PT Treatment Interventions      PT Goals (Current goals can be found in the Care Plan section)  Acute Rehab PT Goals Patient Stated Goal: return home PT Goal Formulation: With patient Time For Goal Achievement: 12/11/21 Potential to Achieve Goals: Good    Frequency       Co-evaluation               AM-PAC PT "6 Clicks" Mobility  Outcome Measure Help needed turning from your back to your side while in a flat bed without using bedrails?: None Help needed moving from lying on your back to sitting on the side of a flat bed without using bedrails?: None Help needed moving to and from a bed to a chair (including a wheelchair)?: None Help needed standing up from a chair using your arms (e.g., wheelchair or bedside chair)?: None Help needed to walk in hospital room?: None Help needed climbing 3-5 steps with a railing? : None 6 Click Score: 24    End of Session   Activity Tolerance: Patient tolerated  treatment well Patient left: in bed;with call bell/phone within reach   PT Visit Diagnosis: Unsteadiness on feet (R26.81);Muscle weakness (generalized) (M62.81);Other abnormalities of gait and mobility (R26.89)    Time: 6962-9528 PT Time Calculation (min) (ACUTE ONLY): 10 min   Charges:   PT Evaluation $PT Eval Low Complexity: 1 Low PT Treatments $Therapeutic Activity: 8-22 mins        12:16 PM, 12/11/21 Lonell Grandchild, MPT Physical Therapist with [facility] [phone] office [phone] mobile phone

## 2021-12-11 NOTE — TOC Transition Note (Signed)
Transition of Care [facility]) - CM/SW Discharge Note   Patient Details  Name: Edward [name] MRN: [id number] Date of Birth: February 06, 1951  Transition of Care [facility]) CM/SW Contact:  Shade Flood, LCSW Phone Number: 12/11/2021, 11:42 AM   Clinical Narrative:     Pt stable for dc home today per MD. Pt now has a high readmission risk score. Spoke with pt to review dc planning and assess for needs. Pt reports that he lives with his wife (though they are separated) and their son. He states that they assist him with his care. Pt has a rollator for DME. Per pt, his wife or son take him to appointments. He is able to obtain his medications as needed.  At this time, pt reports that he does not have any TOC needs for dc. He states that his family members will transport him home.   Barriers to Discharge: Barriers Resolved   Patient Goals and CMS Choice Patient states their goals for this hospitalization and ongoing recovery are:: go home      Expected Discharge Plan and Services   In-house Referral: Clinical Social Work     Living arrangements for the past 2 months: Single Family Home Expected Discharge Date: 12/11/21                                    Prior Living Arrangements/Services Living arrangements for the past 2 months: Single Family Home Lives with:: Adult Children, Spouse Patient language and need for interpreter reviewed:: Yes Do you feel safe going back to the place where you live?: Yes      Need for Family Participation in Patient Care: Yes (Comment) Care giver support system in place?: Yes (comment) Current home services: DME Criminal Activity/Legal Involvement Pertinent to Current Situation/Hospitalization: No - Comment as needed  Activities of Daily Living Home Assistive Devices/Equipment: None ADL Screening (condition at time of admission) Patient's cognitive ability adequate to safely complete daily activities?: Yes Is the patient deaf or have difficulty  hearing?: No Does the patient have difficulty seeing, even when wearing glasses/contacts?: No Does the patient have difficulty concentrating, remembering, or making decisions?: No Patient able to express need for assistance with ADLs?: Yes Does the patient have difficulty dressing or bathing?: No Independently performs ADLs?: Yes (appropriate for developmental age) Does the patient have difficulty walking or climbing stairs?: Yes Weakness of Legs: Both Weakness of Arms/Hands: Both  Permission Sought/Granted                  Emotional Assessment   Attitude/Demeanor/Rapport: Engaged Affect (typically observed): Pleasant Orientation: : Oriented to Self, Oriented to Place, Oriented to  Time, Oriented to Situation Alcohol / Substance Use: Not Applicable Psych Involvement: No (comment)  Admission diagnosis:  Fluid overload [E87.70] Patient Active Problem List   Diagnosis Date Noted   Hypokalemia 12/09/2021   Fluid overload 12/08/2021   AKI (acute kidney injury) ([place])    Anasarca    Hypoalbuminemia    Pneumonia 11/27/2021   Hypoglycemia 11/27/2021   Chronic cholecystitis    Shortness of breath    Other ascites    Abnormal CT scan, esophagus    Non-traumatic rhabdomyolysis    Decompensated cirrhosis of liver with ascites ([place])    Abnormal liver function tests 11/18/2021   Gallstones with acute cholecystitis 11/18/2021   Acute Calculous cholecystitis with Elevated LFTs 11/18/2021   Thrombocytopenia ([place]) 11/18/2021   Iron deficiency  anemia 11/18/2021   Type 2 diabetes with nephropathy ([place]) 02/20/2021   Gastroesophageal reflux disease without esophagitis 08/16/2020   Essential hypertension 08/05/2015   Insomnia 03/26/2014   CKD (chronic kidney disease) stage 3B, GFR 30-59 ml/min (HCC) 03/08/2014   Coronary atherosclerosis of native coronary artery 10/03/2010   Mixed hyperlipidemia 11/24/2008   PCP:  [name], FNP Pharmacy:   [facility] [address], [place] [address] [place] [address] Phone: [phone] Fax: [phone]     Social Determinants of Health (SDOH) Interventions    Readmission Risk Interventions    11/28/2021    4:08 PM 11/22/2021    2:53 PM  Readmission Risk Prevention Plan  Transportation Screening Complete Complete  PCP or Specialist Appt within 3-5 Days Not Complete   Home Care Screening  Patient refused  Medication Review (RN CM)  Complete  HRI or Home Care Consult Complete   Social Work Consult for [place] Planning/Counseling Complete   Palliative Care Screening Complete   Medication Review Press photographer) Complete      Final next level of care: Home/Self Care Barriers to Discharge: Barriers Resolved   Patient Goals and CMS Choice Patient states their goals for this hospitalization and ongoing recovery are:: go home      Discharge Placement                       Discharge Plan and Services In-house Referral: Clinical Social Work                                   Social Determinants of Health (SDOH) Interventions     Readmission Risk Interventions    11/28/2021    4:08 PM 11/22/2021    2:53 PM  Readmission Risk Prevention Plan  Transportation Screening Complete Complete  PCP or Specialist Appt within 3-5 Days Not Complete   Home Care Screening  Patient refused  Medication Review (RN CM)  Complete  HRI or Home Care Consult Complete   Social Work Consult for [place] Planning/Counseling Complete   Palliative Care Screening Complete   Medication Review Press photographer) Complete

## 2021-12-11 NOTE — Discharge Instructions (Signed)
IMPORTANT INFORMATION: PAY CLOSE ATTENTION   PHYSICIAN DISCHARGE INSTRUCTIONS  Follow with Primary care provider  Martin, Mary-Margaret, FNP  and other consultants as instructed by your Hospitalist Physician  SEEK MEDICAL CARE OR RETURN TO EMERGENCY ROOM IF SYMPTOMS COME BACK, WORSEN OR NEW PROBLEM DEVELOPS   Please note: You were cared for by a hospitalist during your hospital stay. Every effort will be made to forward records to your primary care provider.  You can request that your primary care provider send for your hospital records if they have not received them.  Once you are discharged, your primary care physician will handle any further medical issues. Please note that NO REFILLS for any discharge medications will be authorized once you are discharged, as it is imperative that you return to your primary care physician (or establish a relationship with a primary care physician if you do not have one) for your post hospital discharge needs so that they can reassess your need for medications and monitor your lab values.  Please get a complete blood count and chemistry panel checked by your Primary MD at your next visit, and again as instructed by your Primary MD.  Get Medicines reviewed and adjusted: Please take all your medications with you for your next visit with your Primary MD  Laboratory/radiological data: Please request your Primary MD to go over all hospital tests and procedure/radiological results at the follow up, please ask your primary care provider to get all Hospital records sent to his/her office.  In some cases, they will be blood work, cultures and biopsy results pending at the time of your discharge. Please request that your primary care provider follow up on these results.  If you are diabetic, please bring your blood sugar readings with you to your follow up appointment with primary care.    Please call and make your follow up appointments as soon as possible.     Also Note the following: If you experience worsening of your admission symptoms, develop shortness of breath, life threatening emergency, suicidal or homicidal thoughts you must seek medical attention immediately by calling 911 or calling your MD immediately  if symptoms less severe.  You must read complete instructions/literature along with all the possible adverse reactions/side effects for all the Medicines you take and that have been prescribed to you. Take any new Medicines after you have completely understood and accpet all the possible adverse reactions/side effects.   Do not drive when taking Pain medications or sleeping medications (Benzodiazepines)  Do not take more than prescribed Pain, Sleep and Anxiety Medications. It is not advisable to combine anxiety,sleep and pain medications without talking with your primary care practitioner  Special Instructions: If you have smoked or chewed Tobacco  in the last 2 yrs please stop smoking, stop any regular Alcohol  and or any Recreational drug use.  Wear Seat belts while driving.  Do not drive if taking any narcotic, mind altering or controlled substances or recreational drugs or alcohol.        

## 2021-12-11 NOTE — Discharge Summary (Signed)
Physician Discharge Summary  Edward [name] [facility] BSJ:[id number] DOB: [birth year] DOA: 12/08/2021  PCP: [name], FNP  Admit date: 12/08/2021 Discharge date: 12/11/2021  Admitted From:  HOME  Disposition: HOME   Recommendations for Outpatient Follow-up:  Follow up with PCP in 1 weeks on 12/18/21 as scheduled Please obtain BMP in one week to follow electrolytes and renal function  Please follow up  with GI, cardiology and hepatologist as scheduled [facility] EGD RECOMMENDED BY [facility]  Discharge Condition: STABLE   CODE STATUS: FULL  DIET:  LOW SODIUM    Brief Hospitalization Summary: Please see all hospital notes, images, labs for full details of the hospitalization. Brief Admission History:  71 y.o. male with medical history significant of CKD, CAD, essential hypertension, liver cirrhosis, mixed hyperlipidemia, renal insufficiency, type 2 diabetes mellitus, and more presents as a transfer from [facility] with a chief complaint of fluid overload.  Patient was recently discharged from this hospital on July 29.  At that time he was discharged to go to [facility] for liver transplant evaluation.  He was reportedly found not to be a liver transplant candidate.  His creatinine did improve while he was there.  He determined that he needs several more days of IV diuresis, but no longer requires a tertiary care center so they transferred him to [facility].  Patient reports he is feeling much better than his initial presentation.  He is agreeable to the plan of continue diuresis before he is ready for discharge.  Patient does not smoke, does not drink, does not use illicit drugs.  He is vaccinated for COVID.  Patient is full code.  Patient does have a history of smoking 35 years ago, and a history of heavy drinking more than a decade ago as well.   HOSPITAL COURSE BY PROBLEM   Assessment and Plan: * Massive volume overload - Secondary to decompensated liver cirrhosis - Continue midodrine 20 mg  TID - He has diuresed at length on IV lasix and since creatinine has bumped we have stopped IV lasix and will give diuretic holiday today and start oral diuretics starting 8/8.    Filed Weights   12/09/21 1016 12/10/21 0500 12/11/21 0500  Weight: 75.2 kg 74.5 kg 74 kg    Intake/Output Summary (Last 24 hours) at 12/11/2021 1146 Last data filed at 12/11/2021 0327 Gross per 24 hour  Intake 580 ml  Output 1475 ml  Net -895 ml      Hypokalemia Repleted   AKI (acute kidney injury) ([place]) - Creatinine continues to improve with diuresis -Creatinine was as high as 2.89, but has improved near baseline with IV diuresis -recheck BMP in 1 week when he follows up with PCP on 12/18/21  Hypoalbuminemia - Encourage nutrient dense food choices - Likely related to decompensated liver cirrhosis  Anasarca - Patient's edema is markedly improved - He was treated with several days of  IV diuresis lasix 80 mg IV BID and now plan to transition to oral diuretics - he will DC home on oral lasix 40 mg daily and spironolactone 12.5 mg daily  Decompensated cirrhosis of liver with ascites (HCC) - AST 64, ALT 43 - Patient was evaluated at [facility] for liver transplant and found to not be a transplant candidate.  They referred him to hepatologist at [facility] - This is the etiology behind the massive fluid overload - Cmpleted IV Lasix - Patient may need to be set up for outpatient paracentesis as needed - Last paracentesis in  our system was July 25 and had 3.4 L removed -Viral hepatitis profile was negative - Abnormal copper testing in the past - Outpatient follow up with Roseanne Kaufman scheduled soon --the patient needs outpatient EGD to be arranged by [facility] on outpatient follow up.  -- he will start oral lasix 40 mg daily and spironolactone 12.5 mg daily.  Lactulose 15 mL BID.  He will continue ciprofloxacin 500 mg Daily for SBP prophylaxis.    Type 2 diabetes with nephropathy (HCC) Glucose on  chemistry was 183 Patient was hypoglycemic at last admission Lowest dose sliding scale ordered with CBG monitoring CBG (last 3)  Recent Labs    12/10/21 2008 12/11/21 0730 12/11/21 1127  GLUCAP 266* 182* 248*   -- dc home on oral linagliptin 5 mg daily    Discharge Diagnoses:  Principal Problem:   Massive volume overload Active Problems:   Type 2 diabetes with nephropathy (HCC)   Decompensated cirrhosis of liver with ascites (HCC)   Anasarca   Hypoalbuminemia   AKI (acute kidney injury) ([place])   Hypokalemia   Discharge Instructions:  Allergies as of 12/11/2021   No Known Allergies      Medication List     STOP taking these medications    feeding supplement Liqd   furosemide 10 MG/ML injection Commonly known as: LASIX Replaced by: furosemide 40 MG tablet   heparin 5000 UNIT/ML injection   megestrol 400 MG/10ML suspension Commonly known as: MEGACE   octreotide 100 MCG/ML Soln injection Commonly known as: SANDOSTATIN       TAKE these medications    albuterol 108 (90 Base) MCG/ACT inhaler Commonly known as: VENTOLIN HFA Inhale 2 puffs into the lungs every 6 (six) hours as needed for wheezing or shortness of breath.   brimonidine 0.15 % ophthalmic solution Commonly known as: ALPHAGAN Place 1 drop into both eyes 2 (two) times daily.   ciprofloxacin 500 MG tablet Commonly known as: CIPRO Take 1 tablet (500 mg total) by mouth daily with breakfast.   furosemide 40 MG tablet Commonly known as: Lasix Take 1 tablet (40 mg total) by mouth daily. Start taking on: December 12, 2021 Replaces: furosemide 10 MG/ML injection   lactulose 10 GM/15ML solution Commonly known as: CHRONULAC Take 15 mLs (10 g total) by mouth 2 (two) times daily. What changed:  how much to take when to take this   linagliptin 5 MG Tabs tablet Commonly known as: TRADJENTA Take 1 tablet (5 mg total) by mouth daily.   midodrine 10 MG tablet Commonly known as: PROAMATINE Take 2  tablets (20 mg total) by mouth 3 (three) times daily with meals.   pantoprazole 40 MG tablet Commonly known as: PROTONIX Take 1 tablet (40 mg total) by mouth daily.   potassium chloride 10 MEQ tablet Commonly known as: KLOR-CON M Take 1 tablet (10 mEq total) by mouth daily. Start taking on: December 12, 2021   sodium bicarbonate 650 MG tablet Take 1 tablet (650 mg total) by mouth 3 (three) times daily.   spironolactone 25 MG tablet Commonly known as: ALDACTONE Take 0.5 tablets (12.5 mg total) by mouth daily. Start taking on: December 12, 2021   tamsulosin 0.4 MG Caps capsule Commonly known as: FLOMAX Take 1 capsule (0.4 mg total) by mouth daily after supper.   traZODone 50 MG tablet Commonly known as: DESYREL Take 1 tablet (50 mg total) by mouth at bedtime. What changed:  medication strength how much to take  Follow-up Information     [name], FNP. Go on 12/18/2021.   Specialty: Family Medicine Why: 11 am, Hospital Follow Up Contact information: [place] [address] [phone]         Satira Sark, MD. Go on 02/19/2022.   Specialty: Cardiology Why: at 4pm as scheduled Contact information: [place] [address] [phone]         Harriett Rush, PA-C. Go on 01/16/2022.   Specialty: Oncology Why: at 10:30 am as scheduled Contact information: [address] [place] [address] [phone]                No Known Allergies Allergies as of 12/11/2021   No Known Allergies      Medication List     STOP taking these medications    feeding supplement Liqd   furosemide 10 MG/ML injection Commonly known as: LASIX Replaced by: furosemide 40 MG tablet   heparin 5000 UNIT/ML injection   megestrol 400 MG/10ML suspension Commonly known as: MEGACE   octreotide 100 MCG/ML Soln injection Commonly known as: SANDOSTATIN       TAKE these medications    albuterol 108  (90 Base) MCG/ACT inhaler Commonly known as: VENTOLIN HFA Inhale 2 puffs into the lungs every 6 (six) hours as needed for wheezing or shortness of breath.   brimonidine 0.15 % ophthalmic solution Commonly known as: ALPHAGAN Place 1 drop into both eyes 2 (two) times daily.   ciprofloxacin 500 MG tablet Commonly known as: CIPRO Take 1 tablet (500 mg total) by mouth daily with breakfast.   furosemide 40 MG tablet Commonly known as: Lasix Take 1 tablet (40 mg total) by mouth daily. Start taking on: December 12, 2021 Replaces: furosemide 10 MG/ML injection   lactulose 10 GM/15ML solution Commonly known as: CHRONULAC Take 15 mLs (10 g total) by mouth 2 (two) times daily. What changed:  how much to take when to take this   linagliptin 5 MG Tabs tablet Commonly known as: TRADJENTA Take 1 tablet (5 mg total) by mouth daily.   midodrine 10 MG tablet Commonly known as: PROAMATINE Take 2 tablets (20 mg total) by mouth 3 (three) times daily with meals.   pantoprazole 40 MG tablet Commonly known as: PROTONIX Take 1 tablet (40 mg total) by mouth daily.   potassium chloride 10 MEQ tablet Commonly known as: KLOR-CON M Take 1 tablet (10 mEq total) by mouth daily. Start taking on: December 12, 2021   sodium bicarbonate 650 MG tablet Take 1 tablet (650 mg total) by mouth 3 (three) times daily.   spironolactone 25 MG tablet Commonly known as: ALDACTONE Take 0.5 tablets (12.5 mg total) by mouth daily. Start taking on: December 12, 2021   tamsulosin 0.4 MG Caps capsule Commonly known as: FLOMAX Take 1 capsule (0.4 mg total) by mouth daily after supper.   traZODone 50 MG tablet Commonly known as: DESYREL Take 1 tablet (50 mg total) by mouth at bedtime. What changed:  medication strength how much to take        Procedures/Studies: DG CHEST PORT 1 VIEW  Result Date: 12/08/2021 CLINICAL DATA:  Fluid overload. EXAM: PORTABLE CHEST 1 VIEW COMPARISON:  Radiograph 12/02/2021.  CT  11/18/2021 FINDINGS: Improved aeration from prior exam. Heart is upper normal in size. Stable mediastinal contours. Improving pulmonary edema from prior. Small suspected pleural effusions. No confluent consolidation. No pneumothorax. Stable osseous structures. IMPRESSION: 1. Improving lung aeration with  diminishing pulmonary edema from prior exam. 2. Small suspected pleural effusions. Electronically Signed   By: Keith Rake M.D.   On: 12/08/2021 23:25   DG CHEST PORT 1 VIEW  Result Date: 12/02/2021 CLINICAL DATA:  Shortness of breath EXAM: PORTABLE CHEST 1 VIEW COMPARISON:  11/27/2021 FINDINGS: Stable cardiomediastinal contours. Aortic atherosclerosis. Increased perihilar and bibasilar interstitial markings compared to prior. Probable small left pleural effusion. No pneumothorax. IMPRESSION: Increased perihilar and bibasilar interstitial markings compared to prior, suspect interstitial edema. Probable small left pleural effusion. Electronically Signed   By: Davina Poke D.O.   On: 12/02/2021 13:03   US Paracentesis  Result Date: 11/28/2021 INDICATION: Ascites EXAM: ULTRASOUND GUIDED DIAGNOSTIC AND THERAPEUTIC PARACENTESIS MEDICATIONS: None. COMPLICATIONS: None immediate. PROCEDURE: Informed written consent was obtained from the patient after a discussion of the risks, benefits and alternatives to treatment. A timeout was performed prior to the initiation of the procedure. Initial ultrasound scanning demonstrates a large amount of ascites within the right lower abdominal quadrant. The right lower abdomen was prepped and draped in the usual sterile fashion. 1% lidocaine was used for local anesthesia. Following this, a 19 gauge, 7-cm, Yueh catheter was introduced. An ultrasound image was saved for documentation purposes. The paracentesis was performed. The catheter was removed and a dressing was applied. The patient tolerated the procedure well without immediate post procedural complication. FINDINGS:  A total of approximately 3.4 L of yellow ascitic fluid was removed. Samples were sent to the laboratory as requested by the clinical team. IMPRESSION: Successful ultrasound-guided paracentesis yielding 3.4 liters of peritoneal fluid. Electronically Signed   By: Lavonia Dana M.D.   On: 11/28/2021 14:25   [place] EKG SITE RITE  Result Date: 11/28/2021 If Site Rite image not attached, placement could not be confirmed due to current cardiac rhythm.  DG Chest Port 1 View  Result Date: 11/27/2021 CLINICAL DATA:  Shortness of breath EXAM: PORTABLE CHEST 1 VIEW COMPARISON:  11/21/2021 FINDINGS: Cardiac size is within normal limits. There are no signs of pulmonary edema. Linear densities are seen in both lower lung fields with slight improvement. No new focal infiltrates are seen. There is no pleural effusion or pneumothorax. IMPRESSION: Linear densities are seen in the lower lung fields suggesting atelectasis/pneumonia. There is interval improvement in the aeration of both lower lung fields. There are no new infiltrates or signs of pulmonary edema. Electronically Signed   By: Elmer Picker M.D.   On: 11/27/2021 15:57   CT ABDOMEN PELVIS W CONTRAST  Result Date: 11/24/2021 CLINICAL DATA:  Bowel obstruction suspected. Abdominal distension. Hypotension. EXAM: CT ABDOMEN AND PELVIS WITH CONTRAST TECHNIQUE: Multidetector CT imaging of the abdomen and pelvis was performed using the standard protocol following bolus administration of intravenous contrast. RADIATION DOSE REDUCTION: This exam was performed according to the departmental dose-optimization program which includes automated exposure control, adjustment of the mA and/or kV according to patient size and/or use of iterative reconstruction technique. CONTRAST:  122m OMNIPAQUE IOHEXOL 300 MG/ML  SOLN COMPARISON:  CT 6 days ago 11/08/2021 FINDINGS: Lower chest: Small left pleural effusion and adjacent atelectasis, new from prior exam. Trace right pleural effusion.  Heart is normal in size. There are paraesophageal varices. Hepatobiliary: Cirrhotic hepatic morphology. No focal hepatic lesion. Calcified gallstones. No obvious gallbladder wall thickening. There is no biliary dilatation. Pancreas: Parenchymal atrophy. No ductal dilatation or inflammation. Spleen: Again seen splenomegaly, greatest splenic dimension 13.5 cm. No focal splenic abnormality. Adrenals/Urinary Tract: No adrenal nodule. Mild bilateral renal atrophy. No hydronephrosis or  focal renal abnormality. No renal calculi. Urinary bladder is minimally distended. Stomach/Bowel: Paraesophageal and perigastric varices. Assessment for bowel wall thickening is limited due to the presence of abdominal ascites. There is no abnormal bowel distension or obstruction. Normal appendix. Mild low-density wall thickening of the cecum and ascending colon. Left colonic diverticulosis, no focal diverticulitis. Vascular/Lymphatic: Aortic atherosclerosis without aneurysm. The portal vein is patent. Splenic vein is patent. Portosystemic collaterals noted in the abdomen. Paraesophageal and perigastric varices. No bulky abdominopelvic adenopathy. Reproductive: Prostate is unremarkable. Other: Moderate volume ascites with increase from prior exam. No free air. There is generalized body wall edema that has increased. No abdominal wall hernia. Musculoskeletal: There are no acute or suspicious osseous abnormalities. IMPRESSION: 1. No bowel obstruction. Mild low-density wall thickening of the cecum and ascending colon, may be due to portal colopathy. 2. Cirrhosis with portal hypertension and splenomegaly. Paraesophageal and perigastric varices. 3. Moderate volume ascites has increased. Generalized body wall edema has increased. Small left pleural effusion and adjacent atelectasis, new from prior exam. Trace right pleural effusion. 4. Cholelithiasis without CT findings of acute cholecystitis. 5. Colonic diverticulosis without focal  diverticulitis. Aortic Atherosclerosis (ICD10-I70.0). Electronically Signed   By: Keith Rake M.D.   On: 11/24/2021 00:49   DG Chest 2 View  Result Date: 11/21/2021 CLINICAL DATA:  Shortness of breath and weakness. EXAM: CHEST - 2 VIEW COMPARISON:  Chest two views 09/14/2013, CT chest 11/18/2021 FINDINGS: Cardiac and mediastinal contours are within limits. Interval decrease in bilateral lung volumes compared to 09/14/2013 radiographs, however this is likely similar to 11/18/2021 CT. There are bibasilar opacities including horizontal linear septal thickening that was seen on prior CT. Minimal blunting of the bilateral costophrenic angles raises the question of tiny bilateral pleural effusions, however none were seen on recent CT and this may be secondary to the peripheral inferior pulmonary fibrosis. No pneumothorax. Moderate multilevel degenerative disc changes of the midthoracic spine. IMPRESSION: Decreased lung volumes with bibasilar interlobular septal thickening corresponding to the mild interstitial pulmonary fibrosis seen on recent CT. Minimal blunting of the bilateral costophrenic angles raises the question of tiny bilateral pleural effusions, however as no pleural effusions were seen on recent CT, this may reflect the chronic interstitial scarring. Electronically Signed   By: Yvonne Kendall M.D.   On: 11/21/2021 12:58   [place] ASCITES (ABDOMEN LIMITED)  Result Date: 11/21/2021 CLINICAL DATA:  Elevated liver function tests.  Ascites. EXAM: LIMITED ABDOMEN ULTRASOUND FOR ASCITES TECHNIQUE: Limited ultrasound survey for ascites was performed in all four abdominal quadrants. COMPARISON:  11/18/2021 FINDINGS: Small amount of ascites is present in this patient with morphologic findings of cirrhosis. The amount of ascites is currently felt to be below the volume threshold at which paracentesis would yield a significant therapeutic benefit. IMPRESSION: 1. Small amount of scattered ascites in the abdomen.   Cirrhosis. Electronically Signed   By: Van Clines M.D.   On: 11/21/2021 12:05   NM Hepato W/EF  Result Date: 11/20/2021 CLINICAL DATA:  Right upper quadrant abdominal pain x1 month, nondiagnostic ultrasound. EXAM: NUCLEAR MEDICINE HEPATOBILIARY IMAGING WITH GALLBLADDER EF TECHNIQUE: Sequential images of the abdomen were obtained out to 60 minutes following intravenous administration of radiopharmaceutical. After slow intravenous infusion of 1.5 micrograms Cholecystokinin, gallbladder ejection fraction was determined. RADIOPHARMACEUTICALS:  5.5 mCi Tc-96mCholetec IV COMPARISON:  Ultrasound November 18, 2021 FINDINGS: Delayed blood pool activity with delayed clearance of radiotracer from the liver. Prompt uptake and biliary excretion of activity by the liver is seen. Gallbladder activity  is visualized, consistent with patency of cystic duct. Biliary activity passes into small bowel, consistent with patent common bile duct. There is an initial drop in radiotracer activity post CCK administration however there is a subsequent increase in activity towards the end of scintigraphic sequences. In the setting of delayed clearance of radiotracer from the liver. Calculated gallbladder ejection fraction of -56% (at 60 min, normal ejection fraction is greater than 40%.) IMPRESSION: 1. Delayed clearance of radiotracer from blood pool and the liver suggestive of hepatitis, which limits the utility of the study. 2. Reduced gallbladder ejection fraction, which is at least somewhat related to the delayed radiotracer clearance from the liver but is at least suggestive of chronic cholecystitis/biliary dyskinesia. Electronically Signed   By: Dahlia Bailiff M.D.   On: 11/20/2021 13:06   US Abdomen Limited RUQ (LIVER/GB)  Result Date: 11/18/2021 CLINICAL DATA:  Abnormal LFTs. EXAM: ULTRASOUND ABDOMEN LIMITED RIGHT UPPER QUADRANT COMPARISON:  CT of the abdomen and pelvis 11/18/2021 FINDINGS: Gallbladder: Gallbladder wall is  thickened measuring 5.3 mm. No sonographic Percell Miller sign is described. Shadowing gallstones measure up to 11 mm. Common bile duct: Diameter: 3.0 mm, within normal limits. Liver: Liver is shrunken within nodular appearance. Heterogeneous increased echogenicity noted. No discrete lesions are present. Portal vein is patent on color Doppler imaging with normal direction of blood flow towards the liver. Other: Moderate abdominal ascites is present. IMPRESSION: 1. Gallbladder wall thickening with cholelithiasis suggesting acute cholecystitis. 2. Findings suggestive of hepatic cirrhosis. 3. Ascites. Electronically Signed   By: San Morelle M.D.   On: 11/18/2021 12:01   CT CHEST ABDOMEN PELVIS WO CONTRAST  Result Date: 11/18/2021 CLINICAL DATA:  71 year old male history of weakness. Metastatic disease. EXAM: CT CHEST, ABDOMEN AND PELVIS WITHOUT CONTRAST TECHNIQUE: Multidetector CT imaging of the chest, abdomen and pelvis was performed following the standard protocol without IV contrast. RADIATION DOSE REDUCTION: This exam was performed according to the departmental dose-optimization program which includes automated exposure control, adjustment of the mA and/or kV according to patient size and/or use of iterative reconstruction technique. COMPARISON:  No priors. FINDINGS: CT CHEST FINDINGS Cardiovascular: Heart size is normal. There is no significant pericardial fluid, thickening or pericardial calcification. There is aortic atherosclerosis, as well as atherosclerosis of the great vessels of the mediastinum and the coronary arteries, including calcified atherosclerotic plaque in the left main, left anterior descending, left circumflex and right coronary arteries. No pathologically enlarged lymph nodes. Mediastinum/Nodes: No pathologically enlarged mediastinal or hilar lymph nodes. Please note that accurate exclusion of hilar adenopathy is limited on noncontrast CT scans. Esophagus is mildly patulous and thickened  without a discrete esophageal mass confidently identified. No axillary lymphadenopathy. Lungs/Pleura: 3 mm right upper lobe pulmonary nodule (axial image 79 of series 5). 5 mm right lower lobe pulmonary nodule (axial image 91 of series 5). No larger more suspicious appearing pulmonary nodules or masses are noted. No acute consolidative airspace disease. No pleural effusions. Patchy areas of peripheral predominant ground-glass attenuation, septal thickening and mild subpleural reticulation are noted throughout the mid to lower lungs bilaterally. No honeycombing. Musculoskeletal: There are no aggressive appearing lytic or blastic lesions noted in the visualized portions of the skeleton. CT ABDOMEN PELVIS FINDINGS Hepatobiliary: Liver has a shrunken appearance and nodular contour, indicative of underlying cirrhosis. No definite discrete cystic or solid hepatic lesions are confidently identified on today's noncontrast CT examination. Calcified gallstones lying dependently in the gallbladder. Gallbladder is moderately distended. Accurate assessment for pericholecystic fluid and inflammation is limited by  presence of perihepatic ascites. Pancreas: No definite pancreatic mass. Haziness in the peripancreatic fat. No well-defined peripancreatic fluid collections. Spleen: Spleen is enlarged measuring 14.8 x 9.0 x 14.4 cm (estimated splenic volume of 959 mL) . Adrenals/Urinary Tract: Unenhanced appearance of the kidneys and bilateral adrenal glands is normal. No hydroureteronephrosis. Urinary bladder is unremarkable in appearance. Stomach/Bowel: Unenhanced appearance of the stomach is unremarkable. No pathologic dilatation of small bowel or colon. Numerous colonic diverticulae are noted, particularly in the sigmoid colon. No definite focal surrounding inflammatory changes to clearly indicate acute diverticulitis are noted at this time (assessment is limited by presence of small volume of ascites). Appendix is not confidently  identified. Vascular/Lymphatic: Atherosclerotic calcifications throughout the abdominal aorta and pelvic vasculature. Portal vein is dilated measuring 16 mm in the porta hepatis. No definite lymphadenopathy noted in the abdomen or pelvis on today's noncontrast CT examination. Reproductive: Prostate gland and seminal vesicles are unremarkable in appearance. Other: Small volume of ascites.  No pneumoperitoneum. Musculoskeletal: There are no aggressive appearing lytic or blastic lesions noted in the visualized portions of the skeleton. IMPRESSION: 1. Cirrhosis with small volume of ascites. Dilated portal vein and splenomegaly indicative of portal hypertension. 2. Small pulmonary nodules measuring 5 mm or less in size in the right lung, nonspecific, but statistically likely benign. No follow-up needed if patient is low-risk (and has no known or suspected primary neoplasm). Non-contrast chest CT can be considered in 12 months if patient is high-risk. This recommendation follows the consensus statement: Guidelines for Management of Incidental Pulmonary Nodules Detected on CT Images: From the Fleischner Society 2017; Radiology 2017; 284:228-243. 3. No other definite signs of potential metastatic disease noted elsewhere in the chest, abdomen or pelvis on today's noncontrast CT examination. 4. Esophagus appears mildly patulous and diffusely thickened without discrete esophageal mass. Further evaluation with nonemergent endoscopy should be considered to better evaluate these findings in the near future. 5. Cholelithiasis without evidence of acute cholecystitis at this time. 6. Aortic atherosclerosis, in addition to left main and three-vessel coronary artery disease. Please note that although the presence of coronary artery calcium documents the presence of coronary artery disease, the severity of this disease and any potential stenosis cannot be assessed on this non-gated CT examination. Assessment for potential risk factor  modification, dietary therapy or pharmacologic therapy may be warranted, if clinically indicated. 7. Additional incidental findings, as above. Electronically Signed   By: Vinnie Langton M.D.   On: 11/18/2021 09:35   CT L-SPINE NO CHARGE  Result Date: 11/18/2021 CLINICAL DATA:  71 year old male being evaluated for metastatic disease. Weakness. Unable to walk. EXAM: CT LUMBAR SPINE WITHOUT CONTRAST TECHNIQUE: Technique: Multiplanar CT images of the lumbar spine were reconstructed from contemporary CT of the Abdomen and Pelvis. RADIATION DOSE REDUCTION: This exam was performed according to the departmental dose-optimization program which includes automated exposure control, adjustment of the mA and/or kV according to patient size and/or use of iterative reconstruction technique. CONTRAST:  None COMPARISON:  Thoracic spine CT, CT Chest, Abdomen, and Pelvis today reported separately. FINDINGS: Segmentation: Normal, concordant with the thoracic spine numbering today. Alignment: Normal lumbar lordosis. No spondylolisthesis. Minimal levoconvex lumbar scoliosis. Vertebrae: Bone mineralization is within normal limits for age. No lumbar vertebral fracture or lesion identified. Visible sacrum and SI joints appear intact. Paraspinal and other soft tissues: Abdominal and pelvic viscera are detailed separately. Lumbar paraspinal soft tissues are within normal limits. Disc levels: Age-appropriate lumbar spine degeneration. Disc bulging and endplate spurring with mild if any  associated lumbar spinal stenosis. IMPRESSION: 1. Age-appropriate noncontrast CT appearance of the Lumbar Spine. 2.  CT Chest, Abdomen, and Pelvis today are reported separately. Electronically Signed   By: Genevie Ann M.D.   On: 11/18/2021 09:34   CT T-SPINE NO CHARGE  Result Date: 11/18/2021 CLINICAL DATA:  71 year old male being evaluated for metastatic disease. Weakness. Unable to walk. EXAM: CT THORACIC SPINE WITHOUT CONTRAST TECHNIQUE: Multiplanar CT  images of the thoracic spine were reconstructed from contemporary CT of the Chest. RADIATION DOSE REDUCTION: This exam was performed according to the departmental dose-optimization program which includes automated exposure control, adjustment of the mA and/or kV according to patient size and/or use of iterative reconstruction technique. CONTRAST:  None COMPARISON:  CT cervical spine, CT Chest, Abdomen, and Pelvis today reported separately. FINDINGS: Limited cervical spine imaging:  Reported separately today. Thoracic spine segmentation:  Normal. Alignment: Normal for age thoracic kyphosis. No spondylolisthesis. No scoliosis. Vertebrae: Bone mineralization is within normal limits for age. No thoracic vertebral lesion by CT. No thoracic vertebral compression or fracture identified. Visible posterior ribs appear intact. Paraspinal and other soft tissues: Chest and abdominal viscera are reported separately. Thoracic paraspinal soft tissues are within normal limits. Disc levels: Age-appropriate thoracic spine degeneration. No CT evidence of thoracic spinal stenosis. Unremarkable noncontrast appearance of the thoracic spinal canal. IMPRESSION: 1. Normal for age noncontrast CT appearance of the Thoracic Spine. 2.  CT Chest, Abdomen, and Pelvis today are reported separately. Electronically Signed   By: Genevie Ann M.D.   On: 11/18/2021 09:30   CT HEAD WO CONTRAST (5MM)  Result Date: 11/18/2021 CLINICAL DATA:  71 year old male being evaluated for metastatic disease. Weakness. Unable to walk. EXAM: CT HEAD WITHOUT CONTRAST TECHNIQUE: Contiguous axial images were obtained from the base of the skull through the vertex without intravenous contrast. RADIATION DOSE REDUCTION: This exam was performed according to the departmental dose-optimization program which includes automated exposure control, adjustment of the mA and/or kV according to patient size and/or use of iterative reconstruction technique. COMPARISON:  None Available.  FINDINGS: Brain: Fairly normal for age cerebral volume. No midline shift, ventriculomegaly, mass effect, evidence of mass lesion, intracranial hemorrhage or evidence of cortically based acute infarction. Gray-white matter differentiation is within normal limits throughout the brain. No encephalomalacia or cerebral edema identified. Vascular: Calcified atherosclerosis at the skull base. Skull: No acute or suspicious bone lesion identified. Sinuses/Orbits: Visualized paranasal sinuses and mastoids are well aerated. Trace bilateral mastoid air cell fluid. Small mucous retention cyst right maxillary sinus. Other: Negative visible nasopharynx. No suspicious orbit or scalp soft tissue finding. IMPRESSION: Negative for age noncontrast Head CT. Note that early metastatic disease to the brain is not excluded in the absence of intravenous contrast. Electronically Signed   By: Genevie Ann M.D.   On: 11/18/2021 09:26   CT CERVICAL SPINE WO CONTRAST  Result Date: 11/18/2021 CLINICAL DATA:  71 year old male being evaluated for metastatic disease. Weakness. Unable to walk. EXAM: CT CERVICAL SPINE WITHOUT CONTRAST TECHNIQUE: Multidetector CT imaging of the cervical spine was performed without intravenous contrast. Multiplanar CT image reconstructions were also generated. RADIATION DOSE REDUCTION: This exam was performed according to the departmental dose-optimization program which includes automated exposure control, adjustment of the mA and/or kV according to patient size and/or use of iterative reconstruction technique. COMPARISON:  Head CT reported separately. FINDINGS: Alignment: Mild straightening of cervical lordosis. Cervicothoracic junction alignment is within normal limits. Bilateral posterior element alignment is within normal limits. Skull base and vertebrae: Bone  mineralization is within normal limits for age. Visualized skull base is intact. No atlanto-occipital dissociation. No acute or suspicious osseous lesion  identified in the cervical spine. Soft tissues and spinal canal: No prevertebral fluid or swelling. No visible canal hematoma. Negative noncontrast visible neck soft tissues except for calcified carotid atherosclerosis. Disc levels: Ordinary lower cervical disc and endplate degeneration, bulky at C6-C7, eccentric to the left, and associated with up to mild spinal stenosis at that level. But no other convincing cervical spinal stenosis by CT. There is associated moderate to severe left C7 proximal foraminal stenosis. Upper chest: Chest CT, thoracic spine CT reported separately. IMPRESSION: 1. No acute or metastatic process identified in the Cervical Spine by noncontrast CT. 2. Ordinary cervical spine degeneration at C6-C7 with suspected mild spinal stenosis and moderate to severe left C7 foraminal stenosis. Electronically Signed   By: Genevie Ann M.D.   On: 11/18/2021 09:24     Subjective: Pt reports that he feels well. No specific complaints. Edema is markedly improved.  Scrotal edema is so much better now.    Discharge Exam: Vitals:   12/10/21 2008 12/11/21 0457  BP: (!) 127/53 (!) 95/51  Pulse: 94 72  Resp: 18 18  Temp: (!) 97.5 F (36.4 C) 98.6 F (37 C)  SpO2: 97% 94%   Vitals:   12/10/21 1302 12/10/21 2008 12/11/21 0457 12/11/21 0500  BP: 116/63 (!) 127/53 (!) 95/51   Pulse: 95 94 72   Resp: '20 18 18   '$ Temp: 98.4 F (36.9 C) (!) 97.5 F (36.4 C) 98.6 F (37 C)   TempSrc: Oral     SpO2: 96% 97% 94%   Weight:    74 kg  Height:       General: Pt is alert, awake, not in acute distress Cardiovascular: normal S1/S2 +, no rubs, no gallops Respiratory: CTA bilaterally, no wheezing, no rhonchi Abdominal: Soft, NT, mildly distended, bowel sounds + Extremities: very trace edema, no cyanosis   The results of significant diagnostics from this hospitalization (including imaging, microbiology, ancillary and laboratory) are listed below for reference.     Microbiology: No results found for  this or any previous visit (from the past 240 hour(s)).   Labs: BNP (last 3 results) No results for input(s): "BNP" in the last 8760 hours. Basic Metabolic Panel: Recent Labs  Lab 12/08/21 2312 12/09/21 0459 12/11/21 0420  NA 136 135 137  K 3.4* 3.0* 3.7  CL 101 99 98  CO2 '25 27 29  '$ GLUCOSE 183* 151* 212*  BUN 21 21 26*  CREATININE 1.62* 1.59* 2.01*  CALCIUM 8.4* 8.4* 8.6*  MG 2.3 1.9 1.6*   Liver Function Tests: Recent Labs  Lab 12/08/21 2312 12/09/21 0459  AST 64* 58*  ALT 43 42  ALKPHOS 42 37*  BILITOT 2.8* 3.0*  PROT 5.8* 5.7*  ALBUMIN 3.4* 3.4*   No results for input(s): "LIPASE", "AMYLASE" in the last 168 hours. No results for input(s): "AMMONIA" in the last 168 hours. CBC: Recent Labs  Lab 12/08/21 2312 12/09/21 0459  WBC 3.9* 3.3*  NEUTROABS  --  1.7  HGB 9.0* 9.0*  HCT 26.8* 26.8*  MCV 103.1* 100.4*  PLT 60* 65*   Cardiac Enzymes: No results for input(s): "CKTOTAL", "CKMB", "CKMBINDEX", "TROPONINI" in the last 168 hours. BNP: Invalid input(s): "POCBNP" CBG: Recent Labs  Lab 12/10/21 1134 12/10/21 1615 12/10/21 2008 12/11/21 0730 12/11/21 1127  GLUCAP 227* 310* 266* 182* 248*   D-Dimer No results for input(s): "DDIMER"  in the last 72 hours. Hgb A1c No results for input(s): "HGBA1C" in the last 72 hours. Lipid Profile No results for input(s): "CHOL", "HDL", "LDLCALC", "TRIG", "CHOLHDL", "LDLDIRECT" in the last 72 hours. Thyroid function studies No results for input(s): "TSH", "T4TOTAL", "T3FREE", "THYROIDAB" in the last 72 hours.  Invalid input(s): "FREET3" Anemia work up No results for input(s): "VITAMINB12", "FOLATE", "FERRITIN", "TIBC", "IRON", "RETICCTPCT" in the last 72 hours. Urinalysis    Component Value Date/Time   COLORURINE YELLOW 11/27/2021 1815   APPEARANCEUR HAZY (A) 11/27/2021 1815   LABSPEC 1.016 11/27/2021 1815   PHURINE 5.0 11/27/2021 1815   GLUCOSEU NEGATIVE 11/27/2021 1815   HGBUR NEGATIVE 11/27/2021 1815    BILIRUBINUR NEGATIVE 11/27/2021 1815   KETONESUR NEGATIVE 11/27/2021 1815   PROTEINUR NEGATIVE 11/27/2021 1815   NITRITE NEGATIVE 11/27/2021 1815   LEUKOCYTESUR NEGATIVE 11/27/2021 1815   Sepsis Labs Recent Labs  Lab 12/08/21 2312 12/09/21 0459  WBC 3.9* 3.3*   Microbiology No results found for this or any previous visit (from the past 240 hour(s)).  Time coordinating discharge: 43 mins   SIGNED:  Irwin Brakeman, MD  [facility] 12/11/2021, 11:56 AM How to contact the [facility] Attending or Consulting provider [place] or covering provider during after hours [place], for this patient?  Check the care team in [facility] and look for a) attending/consulting [facility] provider listed and b) the [facility] team listed Log into [email or web address] and use [place]'s universal password to access. If you do not have the password, please contact the hospital operator. Locate the [facility] provider you are looking for under [facility] and page to a number that you can be directly reached. If you still have difficulty reaching the provider, please page the [facility] (Director on Call) for the Hospitalists listed on amion for assistance.

## 2021-12-12 ENCOUNTER — Ambulatory Visit (HOSPITAL_BASED_OUTPATIENT_CLINIC_OR_DEPARTMENT_OTHER): Payer: 59 | Admitting: Family

## 2021-12-12 ENCOUNTER — Telehealth: Payer: Self-pay

## 2021-12-12 NOTE — Telephone Encounter (Signed)
Transition Care Management Unsuccessful Follow-up Telephone Call  Date of discharge and from where:   [place] 12-11-21 Dx: Massive fluid overload  Attempts:  1st Attempt  Reason for unsuccessful TCM follow-up call:  Unable to leave message  Transition Care Management Unsuccessful Follow-up Telephone Call  Date of discharge and from where:  [place] 12-11-21 Dx: Massive fluid overload   Attempts:  2nd Attempt  Reason for unsuccessful TCM follow-up call:  Left voice message

## 2021-12-13 NOTE — Telephone Encounter (Signed)
Transition Care Management Unsuccessful Follow-up Telephone Call  Date of discharge and from where:  12/11/21 - [name] -massive fluid overload  Attempts:  3rd Attempt  Reason for unsuccessful TCM follow-up call:  Left voice message  He has appt for 8/14 - I need to speak with him to do TCM documentation.

## 2021-12-15 NOTE — Telephone Encounter (Signed)
Transition Care Management Follow-up Telephone Call Date of discharge and from where: 12/11/21 - [facility] - massive fluid overload How have you been since you were released from the hospital? Doing better - hasn't gained any weight Any questions or concerns? No  Items Reviewed: Did the pt receive and understand the discharge instructions provided? Yes  Medications obtained and verified? Yes  Other? No  Any new allergies since your discharge? No  Dietary orders reviewed? Yes Do you have support at home? Yes   Home Care and Equipment/Supplies: Were home health services ordered? No - this was recommended but he declined Were any new equipment or medical supplies ordered?  No  Functional Questionnaire: (I = Independent and D = Dependent) ADLs: I  Bathing/Dressing- I  Meal Prep- I  Eating- I  Maintaining continence- I  Transferring/Ambulation- I  Managing Meds- I  Follow up appointments reviewed:  PCP Hospital f/u appt confirmed? Yes  Scheduled to see Shelah Lewandowsky on 12/18/2021 @ 11. [place] [facility] f/u appt confirmed? Yes  Scheduled to see Casey Burkitt, onc/hem on 01/16/22 @ 10:30 and Domenic Polite, cardiology 02/19/22 2 4pm Are transportation arrangements needed? No  If their condition worsens, is the pt aware to call PCP or go to the Emergency Dept.? Yes Was the patient provided with contact information for the PCP's office or ED? Yes Was to pt encouraged to call back with questions or concerns? Yes

## 2021-12-15 NOTE — Addendum Note (Signed)
Addended byAdalberto Cole E on: 12/15/2021 04:09 PM   Modules accepted: Orders

## 2021-12-18 ENCOUNTER — Ambulatory Visit (INDEPENDENT_AMBULATORY_CARE_PROVIDER_SITE_OTHER): Payer: 59 | Admitting: Nurse Practitioner

## 2021-12-18 ENCOUNTER — Encounter: Payer: Self-pay | Admitting: Nurse Practitioner

## 2021-12-18 VITALS — BP 105/57 | HR 81 | Temp 97.3°F | Resp 20 | Ht 70.0 in | Wt 166.0 lb

## 2021-12-18 DIAGNOSIS — R188 Other ascites: Secondary | ICD-10-CM

## 2021-12-18 DIAGNOSIS — K7460 Unspecified cirrhosis of liver: Secondary | ICD-10-CM

## 2021-12-18 DIAGNOSIS — Z7689 Persons encountering health services in other specified circumstances: Secondary | ICD-10-CM

## 2021-12-18 NOTE — Patient Instructions (Signed)
Edema perifrico Peripheral Edema  El edema perifrico es la hinchazn causada por la acumulacin de lquido. En la [employer], el edema perifrico afecta la parte inferior de las piernas, los tobillos y los pies. Tambin [employer], las manos y Therapist, occupational. La zona del cuerpo que presenta edema perifrico se ver hinchada. Tambin puede sentirse pesada o caliente. Es posible que sienta que la ropa comienza a apretarle. La presin sobre la zona puede dejar una marca temporal en la piel (edema con fvea). Tal vez no pueda mover el brazo o la pierna hinchados como lo hace habitualmente. Hay muchas causas posibles de edema perifrico. Puede deberse a una complicacin de otras afecciones, como insuficiencia cardaca, enfermedad renal o un problema con la circulacin. Tambin puede ser un efecto secundario de ciertos medicamentos o deberse a una infeccin. Es frecuente Solicitor. A veces, la causa es desconocida. Siga estas instrucciones en su casa: Control del dolor, la rigidez y Psychologist, sport and exercise (eleve) las piernas mientras est sentado o recostado. Muvase con frecuencia para evitar la rigidez y reducir la hinchazn. No permanezca sentado o de pie durante largos perodos. No use ropa ajustada. No use ligas en la parte superior de las piernas. Ejercite las piernas para Advice worker. Esto ayuda a que el lquido pase nuevamente a sus vasos sanguneos, y puede ayudar a Forensic psychologist. Use medias de compresin como se lo haya indicado su mdico. Estas medias ayudan a evitar la formacin de cogulos de [place] y a reducir la hinchazn de las piernas. Es importante que sean del tamao correcto. Estas medias deben ser prescritas por su mdico para evitar posibles lesiones. Si le recomiendan vendajes, selos como se lo haya indicado el mdico. Medicamentos Use los medicamentos de venta libre y los recetados solamente como se lo haya indicado el mdico. El  mdico puede recetarle un medicamento para ayudar a que el cuerpo elimine el exceso de agua (diurtico). Tome este medicamento si se lo indican. Instrucciones generales Siga una dieta con poco contenido de sal (sodio) segn las indicaciones del mdico. A veces, disminuir el consumo de sal puede reducir la hinchazn. Est atento a cualquier cambio en los sntomas. Humctese la piel todos los das para evitar que se agriete y se seque. Concurra a [place]. Esto es importante. Comunquese con un mdico si: Tiene fiebre. Tiene hinchazn en una sola pierna. Tiene ms hinchazn, enrojecimiento o dolor en una pierna o en ambas. Tiene secrecin o llagas en la zona donde tiene edema. Solicite ayuda de inmediato si: Tiene un edema que aparece de forma repentina o empeora, en especial, si est embarazada o tiene una enfermedad. Le falta el aire, especialmente al [employer]. Tiene dolor en el pecho o el abdomen. Se siente dbil. Se siente como si fuera a desmayarse. Estos sntomas pueden Sales executive. Solicite ayuda de inmediato. Llame al 911. No espere a ver si los sntomas desaparecen. No conduzca por sus propios medios [employer] hospital. Resumen El edema perifrico es la hinchazn causada por la acumulacin de lquido. En la [employer], el edema perifrico afecta la parte inferior de las piernas, los tobillos y los pies. Muvase con frecuencia para evitar la rigidez y reducir la hinchazn. No permanezca sentado o de pie durante largos perodos. Est atento a cualquier cambio en los sntomas. Comunquese con un mdico si tiene un edema que aparece de forma repentina o empeora, en especial si  usted est embarazada o tiene una afeccin mdica. Busque ayuda de inmediato si le falta el aire, especialmente al [employer]. Esta informacin no tiene Marine scientist el consejo del mdico. Asegrese de hacerle al mdico cualquier pregunta que  tenga. Document Revised: 01/16/2021 Document Reviewed: 01/16/2021 Elsevier Patient Education  [place].

## 2021-12-18 NOTE — Progress Notes (Signed)
   Subjective:    Patient ID: Edward [name], male    DOB: 11-10-1950, 71 y.o.   MRN: [id number]  Today's visit was for Transitional Care Management.  The patient was discharged from [facility] on 12/11/21 with a primary diagnosis of massive fluid ovreload.   Contact with the patient and/or caregiver, by a clinical staff member, was made on 12/12/21 and was documented as a telephone encounter within the EMR.  Through chart review and discussion with the patient I have determined that management of their condition is of high complexity.    Patient has recently been dx with cirrhosis of the liver. He was seen at [facility] several weeks ago and was denied for liver transplant. He was discharged form hospital on midodrine '20mg'$  TID. He says he is feeling much better. No SOB      Review of Systems  Constitutional:  Positive for fatigue. Negative for diaphoresis.  Eyes:  Negative for pain.  Respiratory:  Negative for shortness of breath.   Cardiovascular:  Positive for leg swelling. Negative for chest pain and palpitations.  Gastrointestinal:  Negative for abdominal pain.  Endocrine: Negative for polydipsia.  Skin:  Negative for rash.  Neurological:  Negative for dizziness, weakness and headaches.  Hematological:  Does not bruise/bleed easily.  All other systems reviewed and are negative.      Objective:   Physical Exam Vitals reviewed.  Constitutional:      Appearance: Normal appearance.  Eyes:     General: No scleral icterus. Cardiovascular:     Rate and Rhythm: Normal rate and regular rhythm.     Heart sounds: Normal heart sounds.  Pulmonary:     Effort: Pulmonary effort is normal.     Breath sounds: Normal breath sounds.  Musculoskeletal:     Right lower leg: Edema (2+) present.     Left lower leg: Edema (2+) present.  Skin:    General: Skin is warm.     Coloration: Skin is jaundiced.  Neurological:     General: No focal deficit present.     Mental Status: He is alert and  oriented to person, place, and time.   BP (!) 105/57   Pulse 81   Temp (!) 97.3 F (36.3 C) (Temporal)   Resp 20   Ht '5\' 10"'$  (1.778 m)   Wt 166 lb (75.3 kg)   SpO2 99%   BMI 23.82 kg/m          Assessment & Plan:   Edward [name] in today with chief complaint of Transitions Of Care (Needs referral for [place] and transplant)   1. Encounter for support and coordination of transition of care Hospital records reviewed  2. Cirrhosis of liver with ascites, unspecified hepatic cirrhosis type ([place]) Keep all follow up appointments Avoid tylenol and alcohol - Ambulatory referral to Gastroenterology    The above assessment and management plan was discussed with the patient. The patient verbalized understanding of and has agreed to the management plan. Patient is aware to call the clinic if symptoms persist or worsen. Patient is aware when to return to the clinic for a follow-up visit. Patient educated on when it is appropriate to go to the emergency department.   Mary-Margaret Hassell Done, FNP

## 2021-12-19 LAB — CMP14+EGFR
ALT: 42 IU/L (ref 0–44)
AST: 64 IU/L — ABNORMAL HIGH (ref 0–40)
Albumin/Globulin Ratio: 1.7 (ref 1.2–2.2)
Albumin: 3.8 g/dL — ABNORMAL LOW (ref 3.9–4.9)
Alkaline Phosphatase: 70 IU/L (ref 44–121)
BUN/Creatinine Ratio: 15 (ref 10–24)
BUN: 29 mg/dL — ABNORMAL HIGH (ref 8–27)
Bilirubin Total: 3 mg/dL — ABNORMAL HIGH (ref 0.0–1.2)
CO2: 23 mmol/L (ref 20–29)
Calcium: 9.2 mg/dL (ref 8.6–10.2)
Chloride: 98 mmol/L (ref 96–106)
Creatinine, Ser: 1.98 mg/dL — ABNORMAL HIGH (ref 0.76–1.27)
Globulin, Total: 2.3 g/dL (ref 1.5–4.5)
Glucose: 133 mg/dL — ABNORMAL HIGH (ref 70–99)
Potassium: 4.2 mmol/L (ref 3.5–5.2)
Sodium: 138 mmol/L (ref 134–144)
Total Protein: 6.1 g/dL (ref 6.0–8.5)
eGFR: 36 mL/min/{1.73_m2} — ABNORMAL LOW (ref >59)

## 2021-12-21 ENCOUNTER — Telehealth: Payer: Self-pay | Admitting: Nurse Practitioner

## 2022-01-09 ENCOUNTER — Inpatient Hospital Stay: Payer: 59 | Attending: Physician Assistant

## 2022-01-09 DIAGNOSIS — E1122 Type 2 diabetes mellitus with diabetic chronic kidney disease: Secondary | ICD-10-CM | POA: Diagnosis not present

## 2022-01-09 DIAGNOSIS — I129 Hypertensive chronic kidney disease with stage 1 through stage 4 chronic kidney disease, or unspecified chronic kidney disease: Secondary | ICD-10-CM | POA: Diagnosis not present

## 2022-01-09 DIAGNOSIS — Z87891 Personal history of nicotine dependence: Secondary | ICD-10-CM | POA: Diagnosis not present

## 2022-01-09 DIAGNOSIS — E610 Copper deficiency: Secondary | ICD-10-CM | POA: Insufficient documentation

## 2022-01-09 DIAGNOSIS — D539 Nutritional anemia, unspecified: Secondary | ICD-10-CM | POA: Diagnosis not present

## 2022-01-09 DIAGNOSIS — Z801 Family history of malignant neoplasm of trachea, bronchus and lung: Secondary | ICD-10-CM | POA: Insufficient documentation

## 2022-01-09 DIAGNOSIS — N1832 Chronic kidney disease, stage 3b: Secondary | ICD-10-CM | POA: Insufficient documentation

## 2022-01-09 DIAGNOSIS — R7401 Elevation of levels of liver transaminase levels: Secondary | ICD-10-CM | POA: Diagnosis not present

## 2022-01-09 DIAGNOSIS — D509 Iron deficiency anemia, unspecified: Secondary | ICD-10-CM

## 2022-01-09 DIAGNOSIS — D696 Thrombocytopenia, unspecified: Secondary | ICD-10-CM | POA: Insufficient documentation

## 2022-01-09 DIAGNOSIS — K7460 Unspecified cirrhosis of liver: Secondary | ICD-10-CM | POA: Insufficient documentation

## 2022-01-09 DIAGNOSIS — K766 Portal hypertension: Secondary | ICD-10-CM | POA: Insufficient documentation

## 2022-01-09 DIAGNOSIS — R161 Splenomegaly, not elsewhere classified: Secondary | ICD-10-CM | POA: Insufficient documentation

## 2022-01-09 LAB — LACTATE DEHYDROGENASE: LDH: 191 U/L (ref 98–192)

## 2022-01-09 LAB — COMPREHENSIVE METABOLIC PANEL
ALT: 53 U/L — ABNORMAL HIGH (ref 0–44)
AST: 77 U/L — ABNORMAL HIGH (ref 15–41)
Albumin: 3.4 g/dL — ABNORMAL LOW (ref 3.5–5.0)
Alkaline Phosphatase: 77 U/L (ref 38–126)
Anion gap: 9 (ref 5–15)
BUN: 30 mg/dL — ABNORMAL HIGH (ref 8–23)
CO2: 23 mmol/L (ref 22–32)
Calcium: 8.8 mg/dL — ABNORMAL LOW (ref 8.9–10.3)
Chloride: 105 mmol/L (ref 98–111)
Creatinine, Ser: 1.66 mg/dL — ABNORMAL HIGH (ref 0.61–1.24)
GFR, Estimated: 44 mL/min — ABNORMAL LOW (ref >60)
Glucose, Bld: 155 mg/dL — ABNORMAL HIGH (ref 70–99)
Potassium: 3.6 mmol/L (ref 3.5–5.1)
Sodium: 137 mmol/L (ref 135–145)
Total Bilirubin: 3.5 mg/dL — ABNORMAL HIGH (ref 0.3–1.2)
Total Protein: 6.6 g/dL (ref 6.5–8.1)

## 2022-01-09 LAB — CBC WITH DIFFERENTIAL/PLATELET
Abs Immature Granulocytes: 0.02 10*3/uL (ref 0.00–0.07)
Basophils Absolute: 0 10*3/uL (ref 0.0–0.1)
Basophils Relative: 0 %
Eosinophils Absolute: 0.1 10*3/uL (ref 0.0–0.5)
Eosinophils Relative: 2 %
HCT: 30.6 % — ABNORMAL LOW (ref 39.0–52.0)
Hemoglobin: 10.6 g/dL — ABNORMAL LOW (ref 13.0–17.0)
Immature Granulocytes: 0 %
Lymphocytes Relative: 21 %
Lymphs Abs: 0.9 10*3/uL (ref 0.7–4.0)
MCH: 34.6 pg — ABNORMAL HIGH (ref 26.0–34.0)
MCHC: 34.6 g/dL (ref 30.0–36.0)
MCV: 100 fL (ref 80.0–100.0)
Monocytes Absolute: 0.5 10*3/uL (ref 0.1–1.0)
Monocytes Relative: 11 %
Neutro Abs: 3 10*3/uL (ref 1.7–7.7)
Neutrophils Relative %: 66 %
Platelets: 59 10*3/uL — ABNORMAL LOW (ref 150–400)
RBC: 3.06 MIL/uL — ABNORMAL LOW (ref 4.22–5.81)
RDW: 16.3 % — ABNORMAL HIGH (ref 11.5–15.5)
WBC: 4.6 10*3/uL (ref 4.0–10.5)
nRBC: 0 % (ref 0.0–0.2)

## 2022-01-09 LAB — FERRITIN: Ferritin: 83 ng/mL (ref 24–336)

## 2022-01-09 LAB — IRON AND TIBC
Iron: 66 ug/dL (ref 45–182)
Saturation Ratios: 31 % (ref 17.9–39.5)
TIBC: 211 ug/dL — ABNORMAL LOW (ref 250–450)
UIBC: 145 ug/dL

## 2022-01-10 ENCOUNTER — Ambulatory Visit (INDEPENDENT_AMBULATORY_CARE_PROVIDER_SITE_OTHER): Payer: 59 | Admitting: Gastroenterology

## 2022-01-10 ENCOUNTER — Telehealth: Payer: Self-pay | Admitting: *Deleted

## 2022-01-10 ENCOUNTER — Encounter: Payer: Self-pay | Admitting: Gastroenterology

## 2022-01-10 VITALS — BP 126/73 | HR 96 | Temp 98.7°F | Ht 70.0 in | Wt 158.8 lb

## 2022-01-10 DIAGNOSIS — R188 Other ascites: Secondary | ICD-10-CM

## 2022-01-10 DIAGNOSIS — K7460 Unspecified cirrhosis of liver: Secondary | ICD-10-CM | POA: Diagnosis not present

## 2022-01-10 NOTE — Progress Notes (Signed)
Gastroenterology Office Note     Primary Care Physician:  [name], FNP  Primary Gastroenterologist: Dr. Abbey Chatters    Chief Complaint   Chief Complaint  Patient presents with   Cirrhosis    Hospital follow up. Feeling weak, tired and "swimmy headed".      History of Present Illness   Edward [name] is a 72 y.o. male presenting today in follow-up with a history of thrombocytopenia, macrocytic anemia, copper deficiency currently following with hematology, CKD, HLD, CAD, diabetes, HTN, cirrhosis complicated by ascites and portal hypertension with paraesophageal and perigastric varices on imaging, gallstones. 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 He was inpatient in July 2023 with decompensated liver cirrhosis and worsening renal disease. He was transferred to [facility] due to increasing MELD. Not a candidate for [facility] per [facility] while [facility] due to social concerns and unreliable transportation.    Upcoming appt with Roosevelt Locks, NP, on October 4th. September 11th opthalmalogist. Needs outpatient EGD for variceal screening.   Taking 1.5 dropper fulls of Lactulose BID. (15 ml). Having 4-5 BMs if not more.   Colonoscopy 10-11 years ago ago in [place]. Wants to hold off on this now but willing to do EGD.   Lasix 40 mg/day, spironolactone 12.5 mg daily. No lower extremity edema or abdominal swelling. Denies mental status changes or confusion. No abdominal pain. Although feels weak and tired, this is much improved from inpatient admission.      Past Medical History:  Diagnosis Date   Cataract    CKD (chronic kidney disease) stage 3, GFR 30-59 ml/min (HCC)    Coronary atherosclerosis of native coronary artery    BMS LAD 2006; residual 75% distal RCA; EF 50%   Essential hypertension    Glaucoma    Low back pain    MI (myocardial infarction) ([place])    Anterior 2006   Mixed hyperlipidemia    Renal insufficiency    Type 2 diabetes mellitus ([place])      Past Surgical History:  Procedure Laterality Date   CATARACT EXTRACTION, BILATERAL     CORONARY ANGIOPLASTY WITH STENT PLACEMENT  2006    Current Outpatient Medications  Medication Sig Dispense Refill   albuterol (VENTOLIN HFA) 108 (90 Base) MCG/ACT inhaler Inhale 2 puffs into the lungs every 6 (six) hours as needed for wheezing or shortness of breath.     brimonidine (ALPHAGAN) 0.15 % ophthalmic solution Place 1 drop into both eyes 2 (two) times daily.     ciprofloxacin (CIPRO) 500 MG tablet Take 1 tablet (500 mg total) by mouth daily with breakfast. 30 tablet 0   furosemide (LASIX) 40 MG tablet Take 1 tablet (40 mg total) by mouth daily. 30 tablet 1   lactulose (CHRONULAC) 10 GM/15ML solution Take 15 mLs (10 g total) by mouth 2 (two) times daily. 946 mL 1   linagliptin (TRADJENTA) 5 MG TABS tablet Take 1 tablet (5 mg total) by mouth daily. 30 tablet 1   midodrine (PROAMATINE) 10 MG tablet Take 2 tablets (20 mg total) by mouth 3 (three) times daily with meals. 90 tablet 1   pantoprazole (PROTONIX) 40 MG tablet Take 1 tablet (40 mg total) by mouth daily. 90 tablet 1   potassium chloride (KLOR-CON M) 10 MEQ tablet Take 1 tablet (10 mEq total) by mouth daily. 30 tablet 1   sodium bicarbonate 650 MG tablet Take 1 tablet (650 mg total) by mouth 3 (three) times daily. 90 tablet 2   spironolactone (ALDACTONE)  25 MG tablet Take 0.5 tablets (12.5 mg total) by mouth daily. 30 tablet 1   tamsulosin (FLOMAX) 0.4 MG CAPS capsule Take 1 capsule (0.4 mg total) by mouth daily after supper. 30 capsule 1   traZODone (DESYREL) 50 MG tablet Take 1 tablet (50 mg total) by mouth at bedtime. 30 tablet 1   No current facility-administered medications for this visit.    Allergies as of 01/10/2022   (No Known Allergies)    Family History  Problem Relation Age of Onset   Aneurysm Father        Brain aneurysm   Lung cancer Mother        Small cell carcinoma of lung,kidney and heart   Heart disease  Mother    Cancer Brother        lungs   Diabetes Sister    Diabetes Sister    Diabetes Brother    Diabetes Brother     Social History   Socioeconomic History   Marital status: Legally Separated    Spouse name: Edward [name]    Number of children: 2   Years of education: 7th   Highest education level: 7th grade  Occupational History   Occupation: Retired  Tobacco Use   Smoking status: Former    Packs/day: 2.00    Years: 20.00    Total pack years: 40.00    Types: Cigarettes    Quit date: 05/08/1987    Years since quitting: 34.7    Passive exposure: Past   Smokeless tobacco: Never  Vaping Use   Vaping Use: Never used  Substance and Sexual Activity   Alcohol use: No    Alcohol/week: 0.0 standard drinks of alcohol   Drug use: No   Sexual activity: Not Currently  Other Topics Concern   Not on file  Social History Narrative   Lives alone    Social Determinants of Health   Financial Resource Strain: Low Risk  (07/11/2021)   Overall Financial Resource Strain (CARDIA)    Difficulty of Paying Living Expenses: Not very hard  Food Insecurity: No Food Insecurity (07/11/2021)   Hunger Vital Sign    Worried About Running Out of Food in the Last Year: Never true    [place] in the Last Year: Never true  Transportation Needs: No Transportation Needs (07/11/2021)   PRAPARE - Hydrologist (Medical): No    Lack of Transportation (Non-Medical): No  Physical Activity: Insufficiently Active (07/11/2021)   Exercise Vital Sign    Days of Exercise per Week: 7 days    Minutes of Exercise per Session: 20 min  Stress: No Stress Concern Present (07/11/2021)   [place]    Feeling of Stress : Not at all  Social Connections: Socially Isolated (07/11/2021)   Social Connection and Isolation Panel [NHANES]    Frequency of Communication with Friends and Family: More than three times a week    Frequency of  Social Gatherings with Friends and Family: More than three times a week    Attends Religious Services: Never    Marine scientist or Organizations: No    Attends Archivist Meetings: Never    Marital Status: Separated  Intimate Partner Violence: Not At Risk (07/11/2021)   Humiliation, Afraid, Rape, and Kick questionnaire    Fear of Current or Ex-Partner: No    Emotionally Abused: No    Physically Abused: No    Sexually  Abused: No     Review of Systems   See HPI   Physical Exam   BP 126/73 (BP Location: Left Arm, Patient Position: Sitting, Cuff Size: Normal)   Pulse 96   Temp 98.7 F (37.1 C) (Oral)   Ht '5\' 10"'$  (1.778 m)   Wt 158 lb 12.8 oz (72 kg)   BMI 22.79 kg/m  General:   Alert and oriented. Pleasant and cooperative. Well-nourished and well-developed.  Head:  Normocephalic and atraumatic. Eyes:  Without icterus Abdomen:  +BS, soft, non-tender and non-distended. No HSM noted. No guarding or rebound. No masses appreciated.  Rectal:  Deferred  Msk:  Symmetrical without gross deformities. Normal posture. Extremities:  1+ pedal and lower extremity edema Neurologic:  Alert and  oriented x4;  grossly normal neurologically. Skin:  Intact without significant lesions or rashes. Psych:  Alert and cooperative. Normal mood and affect.  Lab Results  Component Value Date   ALT 47 (H) 01/23/2022   AST 68 (H) 01/23/2022   ALKPHOS 85 01/23/2022   BILITOT 4.1 (H) 01/23/2022   Lab Results  Component Value Date   CREATININE 1.73 (H) 01/23/2022   BUN 21 01/23/2022   NA 136 01/23/2022   K 4.0 01/23/2022   CL 100 01/23/2022   CO2 25 01/23/2022   Lab Results  Component Value Date   WBC 4.0 01/23/2022   HGB 10.8 (L) 01/23/2022   HCT 31.4 (L) 01/23/2022   MCV 101.9 (H) 01/23/2022   PLT 65 (L) 01/23/2022   Lab Results  Component Value Date   IRON 66 01/09/2022   TIBC 211 (L) 01/09/2022   FERRITIN 83 01/09/2022      Assessment   Roemello Speyer Flythe is a 71  y.o. 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  Hospital course as noted above. Not a candidate for transplant currently per [facility]. He has an upcoming appt with Roosevelt Locks, NP, on October 4th. He will be seeing ophthalmologist 9/11 for evaluation of KF rings. He need outpatient EGD for variceal screening, which we will arrange .  Multiple looser stools on current lactulose dosing of 1.5 "vials". Will decrease to 1 vial BID instead of 1.5 BID. Needs at least 3 BMs per day.   Continue Lasix 40 mg/day, spironolactone 12.5 mg daily.   May ultimately need liver biopsy, but we will await consultation with the Liver clinic first.   Immune to Hep A but not Hep B. Will need to provide prescription.    PLAN    Keep upcoming Liver clinic appt and ophthalmologist appt May need liver biopsy Continue current diuretic regimen Lactulose titration for 3 soft BMs per day US abdomen in Jan 2024 Needs Hep B vaccination prescription.  Proceed with upper endoscopy by Dr. Abbey Chatters in near future: the risks, benefits, and alternatives have been discussed with the patient in detail. The patient states understanding and desires to proceed.  Follow-up in 3 months   Annitta Needs, PhD, [facility] Gastroenterology

## 2022-01-10 NOTE — Patient Instructions (Addendum)
Let's decrease lactulose to 1 vial a day twice a day. We need to make sure you are having 3 soft bowel movements a day but not too many. If this doesn't help you have 3 bowel movements, please call us.   We are arranging an upper endoscopy with Dr. Abbey Chatters.  I will let you know about the need for a liver biopsy.  I am glad you have an appointment in [place] with the liver clinic and also with the eye doctor!  We will see you in 3 months!  It was a pleasure to see you today. I want to create trusting relationships with patients to provide genuine, compassionate, and quality care. I value your feedback. If you receive a survey regarding your visit,  I greatly appreciate you taking time to fill this out.   Annitta Needs, PhD, ANP-BC [facility] Gastroenterology

## 2022-01-10 NOTE — H&P (View-Only) (Signed)
Gastroenterology Office Note     Primary Care Physician:  Edward Pretty, FNP  Primary Gastroenterologist: Dr. Abbey [name]    Chief Complaint   Chief Complaint  Patient presents with   Cirrhosis    Hospital follow up. Feeling weak, tired and "swimmy headed".      History of Present Illness   Edward [name] is a 71 y.o. male presenting today in follow-up with a history of thrombocytopenia, macrocytic anemia, copper deficiency currently following with hematology, CKD, HLD, CAD, diabetes, HTN, cirrhosis complicated by ascites and portal hypertension with paraesophageal and perigastric varices on imaging, gallstones. 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 He was inpatient in July 2023 with decompensated liver cirrhosis and worsening renal disease. He was transferred to [facility] due to increasing MELD. Not a candidate for [facility] per [facility] while inpatient due to social concerns and unreliable transportation.    Upcoming appt with Edward Locks, NP, on October 4th. September 11th opthalmalogist. Needs outpatient EGD for variceal screening.   Taking 1.5 dropper fulls of Lactulose BID. (15 ml). Having 4-5 BMs if not more.   Colonoscopy 10-11 years ago ago in [place]. Wants to hold off on this now but willing to do EGD.   Lasix 40 mg/day, spironolactone 12.5 mg daily. No lower extremity edema or abdominal swelling. Denies mental status changes or confusion. No abdominal pain. Although feels weak and tired, this is much improved from inpatient admission.      Past Medical History:  Diagnosis Date   Cataract    CKD (chronic kidney disease) stage 3, GFR 30-59 ml/min (HCC)    Coronary atherosclerosis of native coronary artery    BMS LAD 2006; residual 75% distal RCA; EF 50%   Essential hypertension    Glaucoma    Low back pain    MI (myocardial infarction) ([place])    Anterior 2006   Mixed hyperlipidemia    Renal insufficiency    Type 2 diabetes mellitus ([place])      Past Surgical History:  Procedure Laterality Date   CATARACT EXTRACTION, BILATERAL     CORONARY ANGIOPLASTY WITH STENT PLACEMENT  2006    Current Outpatient Medications  Medication Sig Dispense Refill   albuterol (VENTOLIN HFA) 108 (90 Base) MCG/ACT inhaler Inhale 2 puffs into the lungs every 6 (six) hours as needed for wheezing or shortness of breath.     brimonidine (ALPHAGAN) 0.15 % ophthalmic solution Place 1 drop into both eyes 2 (two) times daily.     ciprofloxacin (CIPRO) 500 MG tablet Take 1 tablet (500 mg total) by mouth daily with breakfast. 30 tablet 0   furosemide (LASIX) 40 MG tablet Take 1 tablet (40 mg total) by mouth daily. 30 tablet 1   lactulose (CHRONULAC) 10 GM/15ML solution Take 15 mLs (10 g total) by mouth 2 (two) times daily. 946 mL 1   linagliptin (TRADJENTA) 5 MG TABS tablet Take 1 tablet (5 mg total) by mouth daily. 30 tablet 1   midodrine (PROAMATINE) 10 MG tablet Take 2 tablets (20 mg total) by mouth 3 (three) times daily with meals. 90 tablet 1   pantoprazole (PROTONIX) 40 MG tablet Take 1 tablet (40 mg total) by mouth daily. 90 tablet 1   potassium chloride (KLOR-CON M) 10 MEQ tablet Take 1 tablet (10 mEq total) by mouth daily. 30 tablet 1   sodium bicarbonate 650 MG tablet Take 1 tablet (650 mg total) by mouth 3 (three) times daily. 90 tablet 2   spironolactone (ALDACTONE)  25 MG tablet Take 0.5 tablets (12.5 mg total) by mouth daily. 30 tablet 1   tamsulosin (FLOMAX) 0.4 MG CAPS capsule Take 1 capsule (0.4 mg total) by mouth daily after supper. 30 capsule 1   traZODone (DESYREL) 50 MG tablet Take 1 tablet (50 mg total) by mouth at bedtime. 30 tablet 1   No current facility-administered medications for this visit.    Allergies as of 01/10/2022   (No Known Allergies)    Family History  Problem Relation Age of Onset   Aneurysm Father        Brain aneurysm   Lung cancer Mother        Small cell carcinoma of lung,kidney and heart   Heart disease  Mother    Cancer Brother        lungs   Diabetes Sister    Diabetes Sister    Diabetes Brother    Diabetes Brother     Social History   Socioeconomic History   Marital status: Legally Separated    Spouse name: [name]    Number of children: 2   Years of education: 7th   Highest education level: 7th grade  Occupational History   Occupation: Retired  Tobacco Use   Smoking status: Former    Packs/day: 2.00    Years: 20.00    Total pack years: 40.00    Types: Cigarettes    Quit date: 05/08/1987    Years since quitting: 34.7    Passive exposure: Past   Smokeless tobacco: Never  Vaping Use   Vaping Use: Never used  Substance and Sexual Activity   Alcohol use: No    Alcohol/week: 0.0 standard drinks of alcohol   Drug use: No   Sexual activity: Not Currently  Other Topics Concern   Not on file  Social History Narrative   Lives alone    Social Determinants of Health   Financial Resource Strain: Low Risk  (07/11/2021)   Overall Financial Resource Strain (CARDIA)    Difficulty of Paying Living Expenses: Not very hard  Food Insecurity: No Food Insecurity (07/11/2021)   Hunger Vital Sign    Worried About Running Out of Food in the Last Year: Never true    [place] in the Last Year: Never true  Transportation Needs: No Transportation Needs (07/11/2021)   PRAPARE - Hydrologist (Medical): No    Lack of Transportation (Non-Medical): No  Physical Activity: Insufficiently Active (07/11/2021)   Exercise Vital Sign    Days of Exercise per Week: 7 days    Minutes of Exercise per Session: 20 min  Stress: No Stress Concern Present (07/11/2021)   [place]    Feeling of Stress : Not at all  Social Connections: Socially Isolated (07/11/2021)   Social Connection and Isolation Panel [NHANES]    Frequency of Communication with Friends and Family: More than three times a week    Frequency of  Social Gatherings with Friends and Family: More than three times a week    Attends Religious Services: Never    Marine scientist or Organizations: No    Attends Archivist Meetings: Never    Marital Status: Separated  Intimate Partner Violence: Not At Risk (07/11/2021)   Humiliation, Afraid, Rape, and Kick questionnaire    Fear of Current or Ex-Partner: No    Emotionally Abused: No    Physically Abused: No    Sexually  Abused: No     Review of Systems   See HPI   Physical Exam   BP 126/73 (BP Location: Left Arm, Patient Position: Sitting, Cuff Size: Normal)   Pulse 96   Temp 98.7 F (37.1 C) (Oral)   Ht '5\' 10"'$  (1.778 m)   Wt 158 lb 12.8 oz (72 kg)   BMI 22.79 kg/m  General:   Alert and oriented. Pleasant and cooperative. Well-nourished and well-developed.  Head:  Normocephalic and atraumatic. Eyes:  Without icterus Abdomen:  +BS, soft, non-tender and non-distended. No HSM noted. No guarding or rebound. No masses appreciated.  Rectal:  Deferred  Msk:  Symmetrical without gross deformities. Normal posture. Extremities:  1+ pedal and lower extremity edema Neurologic:  Alert and  oriented x4;  grossly normal neurologically. Skin:  Intact without significant lesions or rashes. Psych:  Alert and cooperative. Normal mood and affect.  Lab Results  Component Value Date   ALT 47 (H) 01/23/2022   AST 68 (H) 01/23/2022   ALKPHOS 85 01/23/2022   BILITOT 4.1 (H) 01/23/2022   Lab Results  Component Value Date   CREATININE 1.73 (H) 01/23/2022   BUN 21 01/23/2022   NA 136 01/23/2022   K 4.0 01/23/2022   CL 100 01/23/2022   CO2 25 01/23/2022   Lab Results  Component Value Date   WBC 4.0 01/23/2022   HGB 10.8 (L) 01/23/2022   HCT 31.4 (L) 01/23/2022   MCV 101.9 (H) 01/23/2022   PLT 65 (L) 01/23/2022   Lab Results  Component Value Date   IRON 66 01/09/2022   TIBC 211 (L) 01/09/2022   FERRITIN 83 01/09/2022      Assessment   Edward [name] is a 71  y.o. 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  Hospital course as noted above. Not a candidate for transplant currently per [facility]. He has an upcoming appt with Edward Locks, NP, on October 4th. He will be seeing ophthalmologist 9/11 for evaluation of KF rings. He need outpatient EGD for variceal screening, which we will arrange .  Multiple looser stools on current lactulose dosing of 1.5 "vials". Will decrease to 1 vial BID instead of 1.5 BID. Needs at least 3 BMs per day.   Continue Lasix 40 mg/day, spironolactone 12.5 mg daily.   May ultimately need liver biopsy, but we will await consultation with the Liver clinic first.   Immune to Hep A but not Hep B. Will need to provide prescription.    PLAN    Keep upcoming Liver clinic appt and ophthalmologist appt May need liver biopsy Continue current diuretic regimen Lactulose titration for 3 soft BMs per day US abdomen in Jan 2024 Needs Hep B vaccination prescription.  Proceed with upper endoscopy by Dr. Abbey [name] in near future: the risks, benefits, and alternatives have been discussed with the patient in detail. The patient states understanding and desires to proceed.  Follow-up in 3 months   Annitta Needs, PhD, [facility] Gastroenterology

## 2022-01-10 NOTE — Telephone Encounter (Signed)
LMOVM to call back to schedule EGD w/ Dr. Abbey Chatters asa 3

## 2022-01-12 ENCOUNTER — Telehealth: Payer: Self-pay | Admitting: Nurse Practitioner

## 2022-01-12 MED ORDER — MIDODRINE HCL 10 MG PO TABS: 20.0000 mg | ORAL_TABLET | Freq: Three times a day (TID) | ORAL | 2 refills | Status: AC

## 2022-01-12 NOTE — Telephone Encounter (Signed)
  Prescription Request  01/12/2022  Is this a "Controlled Substance" medicine? NO  Have you seen your PCP in the last 2 weeks? NO  If YES, route message to pool  -  If NO, patient needs to be scheduled for appointment.  What is the name of the medication or equipment? Midodrine 10 mg - Patient is out  Have you contacted your pharmacy to request a refill? YES    Which pharmacy would you like this sent to? [facility]   Patient notified that their request is being sent to the clinical staff for review and that they should receive a response within 2 business days.

## 2022-01-12 NOTE — Telephone Encounter (Signed)
Prescription sent to pharmacy, patient informed.

## 2022-01-15 LAB — HM DIABETES EYE EXAM

## 2022-01-16 ENCOUNTER — Inpatient Hospital Stay (HOSPITAL_BASED_OUTPATIENT_CLINIC_OR_DEPARTMENT_OTHER): Payer: 59 | Admitting: Physician Assistant

## 2022-01-16 ENCOUNTER — Encounter: Payer: Self-pay | Admitting: *Deleted

## 2022-01-16 ENCOUNTER — Telehealth: Payer: Self-pay | Admitting: *Deleted

## 2022-01-16 VITALS — BP 103/62 | HR 85 | Temp 97.2°F | Resp 17 | Ht 70.0 in | Wt 159.9 lb

## 2022-01-16 DIAGNOSIS — D696 Thrombocytopenia, unspecified: Secondary | ICD-10-CM | POA: Diagnosis not present

## 2022-01-16 DIAGNOSIS — E610 Copper deficiency: Secondary | ICD-10-CM

## 2022-01-16 DIAGNOSIS — D509 Iron deficiency anemia, unspecified: Secondary | ICD-10-CM

## 2022-01-16 NOTE — Progress Notes (Signed)
[place] [place], [place] [address]   CLINIC:  Medical Oncology/Hematology  PCP:  [name], [place]  [phone]   REASON FOR VISIT:  Follow-up for thrombocytopenia and macrocytic anemia  INTERVAL HISTORY:  Mr. Launer 71 y.o. male returns for routine follow-up of his thrombocytopenia and macrocytic anemia.  He was last seen by Tarri Abernethy PA-C on 09/05/2021.  Since his last visit, he was hospitalized 3 times in July and August, and was diagnosed with decompensated liver cirrhosis during that time.  He is following with [facility] (NP Roseanne Kaufman) as well as [place].  Today's visit, he reports feeling fair.  He continues to have mild fatigue.  Copper supplement was stopped during hospitalizations.  He stopped taking iron tablets due to gastric upset.  He denies any B symptoms such as fever, chills, night sweats, unintentional weight loss. He reports that he has a good appetite and has not experienced any early satiety, nausea, or abdominal pain.   He denies any abnormal bleeding such as hematemesis, hematochezia, melena, or epistaxis.  He admits to easy bruising, but denies any petechial rash.  He has 60% energy and 80% appetite. He endorses that he is maintaining a stable weight.   REVIEW OF SYSTEMS:  Review of Systems  Constitutional:  Positive for fatigue. Negative for appetite change, chills, diaphoresis, fever and unexpected weight change.  HENT:   Negative for lump/mass and nosebleeds.   Eyes:  Negative for eye problems.  Respiratory:  Positive for shortness of breath. Negative for cough and hemoptysis.   Cardiovascular:  Negative for chest pain, leg swelling and palpitations.  Gastrointestinal:  Positive for abdominal distention. Negative for abdominal pain, blood in stool, constipation, diarrhea, nausea and vomiting.  Genitourinary:  Negative for  hematuria.   Skin: Negative.   Neurological:  Positive for dizziness. Negative for headaches and light-headedness.  Hematological:  Does not bruise/bleed easily.  Psychiatric/Behavioral:  Positive for sleep disturbance.       PAST MEDICAL/SURGICAL HISTORY:  Past Medical History:  Diagnosis Date   Cataract    CKD (chronic kidney disease) stage 3, GFR 30-59 ml/min (HCC)    Coronary atherosclerosis of native coronary artery    BMS LAD 2006; residual 75% distal RCA; EF 50%   Essential hypertension    Glaucoma    Low back pain    MI (myocardial infarction) ([place])    Anterior 2006   Mixed hyperlipidemia    Renal insufficiency    Type 2 diabetes mellitus ([place])    Past Surgical History:  Procedure Laterality Date   CATARACT EXTRACTION, BILATERAL     CORONARY ANGIOPLASTY WITH STENT PLACEMENT  2006     SOCIAL HISTORY:  Social History   Socioeconomic History   Marital status: Legally Separated    Spouse name: [name]    Number of children: 2   Years of education: 7th   Highest education level: 7th grade  Occupational History   Occupation: Retired  Tobacco Use   Smoking status: Former    Packs/day: 2.00    Years: 20.00    Total pack years: 40.00    Types: Cigarettes    Quit date: 05/08/1987    Years since quitting: 34.7    Passive exposure: Past   Smokeless tobacco: Never  Vaping Use   Vaping Use: Never used  Substance and Sexual Activity   Alcohol use: No    Alcohol/week: 0.0 standard drinks of  alcohol   Drug use: No   Sexual activity: Not Currently  Other Topics Concern   Not on file  Social History Narrative   Lives alone    Social Determinants of Health   Financial Resource Strain: Low Risk  (07/11/2021)   Overall Financial Resource Strain (CARDIA)    Difficulty of Paying Living Expenses: Not very hard  Food Insecurity: No Food Insecurity (07/11/2021)   Hunger Vital Sign    Worried About Running Out of Food in the Last Year: Never true    Ran Out of Food in  the Last Year: Never true  Transportation Needs: No Transportation Needs (07/11/2021)   PRAPARE - Hydrologist (Medical): No    Lack of Transportation (Non-Medical): No  Physical Activity: Insufficiently Active (07/11/2021)   Exercise Vital Sign    Days of Exercise per Week: 7 days    Minutes of Exercise per Session: 20 min  Stress: No Stress Concern Present (07/11/2021)   [place]    Feeling of Stress : Not at all  Social Connections: Socially Isolated (07/11/2021)   Social Connection and Isolation Panel [NHANES]    Frequency of Communication with Friends and Family: More than three times a week    Frequency of Social Gatherings with Friends and Family: More than three times a week    Attends Religious Services: Never    Marine scientist or Organizations: No    Attends Archivist Meetings: Never    Marital Status: Separated  Intimate Partner Violence: Not At Risk (07/11/2021)   Humiliation, Afraid, Rape, and Kick questionnaire    Fear of Current or Ex-Partner: No    Emotionally Abused: No    Physically Abused: No    Sexually Abused: No    FAMILY HISTORY:  Family History  Problem Relation Age of Onset   Aneurysm Father        Brain aneurysm   Lung cancer Mother        Small cell carcinoma of lung,kidney and heart   Heart disease Mother    Cancer Brother        lungs   Diabetes Sister    Diabetes Sister    Diabetes Brother    Diabetes Brother     CURRENT MEDICATIONS:  Outpatient Encounter Medications as of 01/16/2022  Medication Sig   albuterol (VENTOLIN HFA) 108 (90 Base) MCG/ACT inhaler Inhale 2 puffs into the lungs every 6 (six) hours as needed for wheezing or shortness of breath.   brimonidine (ALPHAGAN) 0.15 % ophthalmic solution Place 1 drop into both eyes 2 (two) times daily.   ciprofloxacin (CIPRO) 500 MG tablet Take 1 tablet (500 mg total) by mouth daily with  breakfast.   furosemide (LASIX) 40 MG tablet Take 1 tablet (40 mg total) by mouth daily.   lactulose (CHRONULAC) 10 GM/15ML solution Take 15 mLs (10 g total) by mouth 2 (two) times daily.   linagliptin (TRADJENTA) 5 MG TABS tablet Take 1 tablet (5 mg total) by mouth daily.   midodrine (PROAMATINE) 10 MG tablet Take 2 tablets (20 mg total) by mouth 3 (three) times daily with meals.   pantoprazole (PROTONIX) 40 MG tablet Take 1 tablet (40 mg total) by mouth daily.   potassium chloride (KLOR-CON M) 10 MEQ tablet Take 1 tablet (10 mEq total) by mouth daily.   sodium bicarbonate 650 MG tablet Take 1 tablet (650 mg total) by mouth 3 (  three) times daily.   spironolactone (ALDACTONE) 25 MG tablet Take 0.5 tablets (12.5 mg total) by mouth daily.   tamsulosin (FLOMAX) 0.4 MG CAPS capsule Take 1 capsule (0.4 mg total) by mouth daily after supper.   traZODone (DESYREL) 50 MG tablet Take 1 tablet (50 mg total) by mouth at bedtime.   No facility-administered encounter medications on file as of 01/16/2022.    ALLERGIES:  No Known Allergies   PHYSICAL EXAM:  ECOG PERFORMANCE STATUS: 1 - Symptomatic but completely ambulatory  There were no vitals filed for this visit. There were no vitals filed for this visit. Physical Exam Constitutional:      Appearance: Normal appearance.  HENT:     Head: Normocephalic and atraumatic.     Mouth/Throat:     Mouth: Mucous membranes are moist.  Eyes:     Extraocular Movements: Extraocular movements intact.     Pupils: Pupils are equal, round, and reactive to light.  Cardiovascular:     Rate and Rhythm: Normal rate and regular rhythm.     Pulses: Normal pulses.     Heart sounds: Normal heart sounds.  Pulmonary:     Effort: Pulmonary effort is normal.     Breath sounds: Normal breath sounds.  Abdominal:     General: Bowel sounds are normal. There is distension.     Palpations: Abdomen is soft.     Tenderness: There is no abdominal tenderness.     Comments:  Edge of liver palpated below rib margins.  No splenomegaly appreciated on exam.  Musculoskeletal:        General: No swelling.     Right lower leg: No edema.     Left lower leg: No edema.  Lymphadenopathy:     Cervical: No cervical adenopathy.  Skin:    General: Skin is warm and dry.  Neurological:     General: No focal deficit present.     Mental Status: He is alert and oriented to person, place, and time.  Psychiatric:        Mood and Affect: Mood normal.        Behavior: Behavior normal.      LABORATORY DATA:  I have reviewed the labs as listed.  CBC    Component Value Date/Time   WBC 4.6 01/09/2022 0952   RBC 3.06 (L) 01/09/2022 0952   HGB 10.6 (L) 01/09/2022 0952   HGB 12.2 (L) 11/14/2021 0820   HCT 30.6 (L) 01/09/2022 0952   HCT 35.5 (L) 11/14/2021 0820   PLT 59 (L) 01/09/2022 0952   PLT 61 (LL) 11/14/2021 0820   MCV 100.0 01/09/2022 0952   MCV 96 11/14/2021 0820   MCH 34.6 (H) 01/09/2022 0952   MCHC 34.6 01/09/2022 0952   RDW 16.3 (H) 01/09/2022 0952   RDW 14.4 11/14/2021 0820   LYMPHSABS 0.9 01/09/2022 0952   LYMPHSABS 0.9 11/14/2021 0820   MONOABS 0.5 01/09/2022 0952   EOSABS 0.1 01/09/2022 0952   EOSABS 0.0 11/14/2021 0820   BASOSABS 0.0 01/09/2022 0952   BASOSABS 0.0 11/14/2021 0820      Latest Ref Rng & Units 01/09/2022    9:52 AM 12/18/2021   11:18 AM 12/11/2021    4:20 AM  CMP  Glucose 70 - 99 mg/dL 155  133  212   BUN 8 - 23 mg/dL 30  29  26    Creatinine 0.61 - 1.24 mg/dL 1.66  1.98  2.01   Sodium 135 - 145 mmol/L 137  138  137   Potassium 3.5 - 5.1 mmol/L 3.6  4.2  3.7   Chloride 98 - 111 mmol/L 105  98  98   CO2 22 - 32 mmol/L 23  23  29    Calcium 8.9 - 10.3 mg/dL 8.8  9.2  8.6   Total Protein 6.5 - 8.1 g/dL 6.6  6.1    Total Bilirubin 0.3 - 1.2 mg/dL 3.5  3.0    Alkaline Phos 38 - 126 U/L 77  70    AST 15 - 41 U/L 77  64    ALT 0 - 44 U/L 53  42      DIAGNOSTIC IMAGING:  I have independently reviewed the relevant imaging and discussed  with the patient.  ASSESSMENT & PLAN: 1.   Moderate thrombocytopenia: - Patient has had thrombocytopenia noted since at least May 2021 - last results before 2021 showed normal platelets at 2009 - Initial work-up of thrombocytopenia was unrevealing.  Negative for nutritional deficiencies or infectious causes of thrombocytopenia.  SPEP normal. - Abdominal ultrasound (12/21/2020): Mild splenomegaly with volume 895 mL.  No liver abnormalities noted. - Hospitalized in July/August 2023 and diagnosed with decompensated liver cirrhosis - CT abdomen/pelvis (11/23/2021): Cirrhosis with portal hypertension, splenomegaly (13.5 cm), moderate ascites, and esophageal/perigastric varices - Has easy bruising on the upper extremities but no active bleeding such as hematemesis, hematochezia, melena, or epistaxis.  - He does not have any B symptoms.   - Most recent CBC (01/09/2022): Platelets 59, WBC and differential normal. -Thrombocytopenia suspected to be secondary to liver cirrhosis and splenomegaly.  Differential also includes ITP versus MDS. - PLAN: No treatment of thrombocytopenia indicated at this time. -- RTC in 4 months for repeat labs  - Patient may benefit from bone marrow biopsy in the future if any significant worsening of baseline blood counts.   2.  Macrocytic anemia +/- iron deficiency: - Baseline hemoglobin around 10.0-12.0, with mild macrocytosis - No EGD or colonoscopy on file - SPEP in June 2021 was normal.  Erythropoietin normal at 11.9. - No epistaxis, hematochezia, or melena   - Currently taking ferrous sulfate 325 mg once daily  - Most recent labs (01/09/2022): Hgb 10.6/MCV 100.0, LDH normal.  Ferritin 83 with iron saturation 31%.  Creatinine 1.66/GFR 44 with baseline CKD stage IIIb - Differential diagnosis favors combination anemia from CKD IIIb and relative iron deficiency.  Macrocytosis from liver disease. - PLAN: Continue daily ferrous sulfate, recommended to take in a different time of  day that his Protonix - RTC in 4 months for repeat labs (CBC, CMP, iron panel) and office visit. - If hemoglobin drops to less than 10.0, we will consider using ESA.  3.  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  He is not taking any zinc. - We will check for Wilson's disease in the setting of low copper and low ceruloplasmin.  Low ceruloplasmin may also be from copper deficiency. - PLAN:  We will defer to GI for work-up of possible Wilson's disease.  They have planned for referral to ophthalmology to evaluate for [facility] rings and will consider possible liver biopsy. -  We will hold off on copper supplementation at this time.     4.  CKD stage IIIb: -He has CKD with creatinine ranging from 1.4-1.8. -Renal ultrasound on 02/07/2016 shows mild bilateral lobular renal contour.  Scarring cannot be excluded.  No mass or hydronephrosis. - PLAN: Continue follow-up with Dr. Theador Hawthorne   PLAN SUMMARY & DISPOSITION: Labs in 4 months Office visit 1 week after labs  All questions were answered. The patient knows to call the clinic with any problems, questions or concerns.  Medical decision making: Moderate  Time spent on visit: I spent 20 minutes counseling the patient face to face. The total time spent in the appointment was 30 minutes and more than 50% was on counseling.   Harriett Rush, PA-C  01/16/2022 12:01 PM

## 2022-01-16 NOTE — Telephone Encounter (Signed)
Pt is due to have Esophagogastroduodenoscopy (EGD) not colonoscopy

## 2022-01-16 NOTE — Telephone Encounter (Signed)
Attempted to contact pt to schedule colonoscopy, unable to leave message. Will mail letter

## 2022-01-16 NOTE — Telephone Encounter (Signed)
PA:  APPROVED Authorization #: [id number]  DOS: 02/01/22-05/02/22

## 2022-01-16 NOTE — Addendum Note (Signed)
Addended by: Tarri Abernethy on: 01/16/2022 12:07 PM   Modules accepted: Orders

## 2022-01-16 NOTE — Patient Instructions (Signed)
[place] at [place] **   You were seen today by Tarri Abernethy PA-C for your low platelets and your anemia.    ANEMIA: This is related to your chronic kidney disease, liver cirrhosis, and your iron deficiency. At this time, your anemia is mild and does not need any treatment. We will recheck your labs in 4 months to see if you need any IV iron. We may need to start you on shots in the future to help your body make more blood cells.  LOW PLATELETS: Your low platelets are related to liver cirrhosis.  We will continue to monitor these levels closely.  If your platelet levels get too low, we will check a bone marrow biopsy, but we will hold off on this test for now.  FOLLOW-UP APPOINTMENT: Labs in 4 months with office visit 1 week after labs.  ** Thank you for trusting me with your healthcare!  I strive to provide all of my patients with quality care at each visit.  If you receive a survey for this visit, I would be so grateful to you for taking the time to provide feedback.  Thank you in advance!  ~ Teckla Christiansen                   Dr. Derek Jack   &   Tarri Abernethy, PA-C   - - - - - - - - - - - - - - - - - -    Thank you for choosing [place] at [facility] to provide your oncology and hematology care.  To afford each patient quality time with our provider, please arrive at least 15 minutes before your scheduled appointment time.   If you have a lab appointment with the [facility] please come in thru the Main Entrance and check in at the main information desk.  You need to re-schedule your appointment should you arrive 10 or more minutes late.  We strive to give you quality time with our providers, and arriving late affects you and other patients whose appointments are after yours.  Also, if you no show three or more times for appointments you may be dismissed from the clinic at the  providers discretion.     Again, thank you for choosing [facility].  Our hope is that these requests will decrease the amount of time that you wait before being seen by our physicians.       _____________________________________________________________  Should you have questions after your visit to [facility], please contact our office at [phone] and follow the prompts.  Our office hours are 8:00 a.m. and 4:30 p.m. Monday - Friday.  Please note that voicemails left after 4:00 p.m. may not be returned until the following business day.  We are closed weekends and major holidays.  You do have access to a nurse 24-7, just call the main number to the clinic [phone] and do not press any options, hold on the line and a nurse will answer the phone.    For prescription refill requests, have your pharmacy contact our office and allow 72 hours.

## 2022-01-23 ENCOUNTER — Encounter (HOSPITAL_COMMUNITY): Payer: Self-pay | Admitting: Emergency Medicine

## 2022-01-23 ENCOUNTER — Other Ambulatory Visit: Payer: Self-pay

## 2022-01-23 ENCOUNTER — Emergency Department (HOSPITAL_COMMUNITY)
Admission: EM | Admit: 2022-01-23 | Discharge: 2022-01-23 | Disposition: A | Discharge status: Home / Self Care | Payer: 59 | Attending: Emergency Medicine | Admitting: Emergency Medicine

## 2022-01-23 ENCOUNTER — Emergency Department (HOSPITAL_COMMUNITY): Payer: 59

## 2022-01-23 DIAGNOSIS — N189 Chronic kidney disease, unspecified: Secondary | ICD-10-CM | POA: Diagnosis not present

## 2022-01-23 DIAGNOSIS — D696 Thrombocytopenia, unspecified: Secondary | ICD-10-CM | POA: Insufficient documentation

## 2022-01-23 DIAGNOSIS — R109 Unspecified abdominal pain: Secondary | ICD-10-CM | POA: Diagnosis present

## 2022-01-23 DIAGNOSIS — R748 Abnormal levels of other serum enzymes: Secondary | ICD-10-CM | POA: Diagnosis not present

## 2022-01-23 DIAGNOSIS — Z7984 Long term (current) use of oral hypoglycemic drugs: Secondary | ICD-10-CM | POA: Diagnosis not present

## 2022-01-23 DIAGNOSIS — K7469 Other cirrhosis of liver: Secondary | ICD-10-CM | POA: Diagnosis not present

## 2022-01-23 DIAGNOSIS — Z79899 Other long term (current) drug therapy: Secondary | ICD-10-CM | POA: Insufficient documentation

## 2022-01-23 DIAGNOSIS — I129 Hypertensive chronic kidney disease with stage 1 through stage 4 chronic kidney disease, or unspecified chronic kidney disease: Secondary | ICD-10-CM | POA: Diagnosis not present

## 2022-01-23 DIAGNOSIS — E119 Type 2 diabetes mellitus without complications: Secondary | ICD-10-CM | POA: Diagnosis not present

## 2022-01-23 DIAGNOSIS — E8770 Fluid overload, unspecified: Secondary | ICD-10-CM | POA: Insufficient documentation

## 2022-01-23 DIAGNOSIS — K7460 Unspecified cirrhosis of liver: Secondary | ICD-10-CM

## 2022-01-23 LAB — CBC WITH DIFFERENTIAL/PLATELET
Abs Immature Granulocytes: 0.01 10*3/uL (ref 0.00–0.07)
Basophils Absolute: 0 10*3/uL (ref 0.0–0.1)
Basophils Relative: 1 %
Eosinophils Absolute: 0 10*3/uL (ref 0.0–0.5)
Eosinophils Relative: 1 %
HCT: 31.4 % — ABNORMAL LOW (ref 39.0–52.0)
Hemoglobin: 10.8 g/dL — ABNORMAL LOW (ref 13.0–17.0)
Immature Granulocytes: 0 %
Lymphocytes Relative: 18 %
Lymphs Abs: 0.7 10*3/uL (ref 0.7–4.0)
MCH: 35.1 pg — ABNORMAL HIGH (ref 26.0–34.0)
MCHC: 34.4 g/dL (ref 30.0–36.0)
MCV: 101.9 fL — ABNORMAL HIGH (ref 80.0–100.0)
Monocytes Absolute: 0.4 10*3/uL (ref 0.1–1.0)
Monocytes Relative: 9 %
Neutro Abs: 2.8 10*3/uL (ref 1.7–7.7)
Neutrophils Relative %: 71 %
Platelets: 65 10*3/uL — ABNORMAL LOW (ref 150–400)
RBC: 3.08 MIL/uL — ABNORMAL LOW (ref 4.22–5.81)
RDW: 15.3 % (ref 11.5–15.5)
WBC: 4 10*3/uL (ref 4.0–10.5)
nRBC: 0 % (ref 0.0–0.2)

## 2022-01-23 LAB — COMPREHENSIVE METABOLIC PANEL
ALT: 47 U/L — ABNORMAL HIGH (ref 0–44)
AST: 68 U/L — ABNORMAL HIGH (ref 15–41)
Albumin: 3.3 g/dL — ABNORMAL LOW (ref 3.5–5.0)
Alkaline Phosphatase: 85 U/L (ref 38–126)
Anion gap: 11 (ref 5–15)
BUN: 21 mg/dL (ref 8–23)
CO2: 25 mmol/L (ref 22–32)
Calcium: 8.8 mg/dL — ABNORMAL LOW (ref 8.9–10.3)
Chloride: 100 mmol/L (ref 98–111)
Creatinine, Ser: 1.73 mg/dL — ABNORMAL HIGH (ref 0.61–1.24)
GFR, Estimated: 42 mL/min — ABNORMAL LOW (ref >60)
Glucose, Bld: 176 mg/dL — ABNORMAL HIGH (ref 70–99)
Potassium: 4 mmol/L (ref 3.5–5.1)
Sodium: 136 mmol/L (ref 135–145)
Total Bilirubin: 4.1 mg/dL — ABNORMAL HIGH (ref 0.3–1.2)
Total Protein: 6.4 g/dL — ABNORMAL LOW (ref 6.5–8.1)

## 2022-01-23 LAB — TROPONIN I (HIGH SENSITIVITY)
Troponin I (High Sensitivity): 19 ng/L — ABNORMAL HIGH (ref <18)
Troponin I (High Sensitivity): 23 ng/L — ABNORMAL HIGH (ref <18)

## 2022-01-23 LAB — BODY FLUID CELL COUNT WITH DIFFERENTIAL
Lymphs, Fluid: 90 %
Monocyte-Macrophage-Serous Fluid: 4 % — ABNORMAL LOW (ref 50–90)
Neutrophil Count, Fluid: 6 % (ref 0–25)
Total Nucleated Cell Count, Fluid: 179 cu mm (ref 0–1000)

## 2022-01-23 LAB — GRAM STAIN

## 2022-01-23 LAB — LIPASE, BLOOD: Lipase: 54 U/L — ABNORMAL HIGH (ref 11–51)

## 2022-01-23 LAB — CBG MONITORING, ED: Glucose-Capillary: 183 mg/dL — ABNORMAL HIGH (ref 70–99)

## 2022-01-23 LAB — PROTEIN, PLEURAL OR PERITONEAL FLUID: Total protein, fluid: <3 g/dL

## 2022-01-23 LAB — MAGNESIUM: Magnesium: 2 mg/dL (ref 1.7–2.4)

## 2022-01-23 LAB — ALBUMIN, PLEURAL OR PERITONEAL FLUID: Albumin, Fluid: <1.5 g/dL

## 2022-01-23 MED ORDER — ALBUTEROL SULFATE HFA 108 (90 BASE) MCG/ACT IN AERS: 1.0000 | INHALATION_SPRAY | Freq: Four times a day (QID) | RESPIRATORY_TRACT | 0 refills | Status: AC | PRN

## 2022-01-23 NOTE — ED Notes (Signed)
Patient transported to X-ray 

## 2022-01-23 NOTE — ED Triage Notes (Signed)
Pt to the ED with complaints of abdominal distention with a past history of paracentesis 3 weeks ago.  Pt has a history of liver failure.

## 2022-01-23 NOTE — Discharge Instructions (Addendum)
You had approximately 5 L of fluid drawn from your abdomen today  Please keep your upcoming appointment with GI and with the liver clinic in [place].  Return to the emergency department for any new or worsening symptoms such as fever, chills, abdominal pain, nausea or vomiting.

## 2022-01-23 NOTE — ED Provider Notes (Signed)
[facility] EMERGENCY DEPARTMENT Provider Note   CSN: [id number] Arrival date & time: 01/23/22  0945     History {Add pertinent medical, surgical, social history, OB history to HPI:1} Chief Complaint  Patient presents with   Abdominal Pain    Edward [name] is a 71 y.o. male.   Abdominal Pain Associated symptoms: shortness of breath   Associated symptoms: no chest pain, no chills, no constipation, no cough, no diarrhea, no dysuria, no fever, no nausea and no vomiting        Edward [name] is a 71 y.o. male with past medical history of liver failure, coronary atherosclerosis, hypertension, type 2 diabetes, chronic kidney disease not currently on dialysis who presents to the Emergency Department complaining of abdominal distention, gradually worsening for several weeks.  He underwent paracentesis approximately 3 weeks ago at [facility] and here on 11/28/21 and had temporary relief.  Returns today due to swelling of his abdomen.  He also appreciates some shortness of breath, mostly with exertion.  He denies any abdominal pain, vomiting, fever, chills, confusion, chest pain or increasing peripheral edema.    Home Medications Prior to Admission medications   Medication Sig Start Date End Date Taking? Authorizing Provider  albuterol (VENTOLIN HFA) 108 (90 Base) MCG/ACT inhaler Inhale 2 puffs into the lungs every 6 (six) hours as needed for wheezing or shortness of breath.   Yes [provider]  brimonidine (ALPHAGAN) 0.2 % ophthalmic solution 1 drop 2 (two) times daily. 01/09/22  Yes [provider]  furosemide (LASIX) 40 MG tablet Take 1 tablet (40 mg total) by mouth daily. 12/12/21 12/12/22 Yes Johnson, Clanford L, MD  lactulose (CHRONULAC) 10 GM/15ML solution Take 15 mLs (10 g total) by mouth 2 (two) times daily. 12/11/21  Yes Johnson, Clanford L, MD  linagliptin (TRADJENTA) 5 MG TABS tablet Take 1 tablet (5 mg total) by mouth daily. 12/11/21  Yes Johnson, Clanford L, MD  midodrine  (PROAMATINE) 10 MG tablet Take 2 tablets (20 mg total) by mouth 3 (three) times daily with meals. 01/12/22  Yes Martin, Mary-Margaret, FNP  pantoprazole (PROTONIX) 40 MG tablet Take 1 tablet (40 mg total) by mouth daily. 08/15/21  Yes Martin, Mary-Margaret, FNP  potassium chloride (KLOR-CON M) 10 MEQ tablet Take 1 tablet (10 mEq total) by mouth daily. 12/12/21  Yes Johnson, Clanford L, MD  sodium bicarbonate 650 MG tablet Take 1 tablet (650 mg total) by mouth 3 (three) times daily. 11/22/21  Yes Barton Dubois, MD  spironolactone (ALDACTONE) 25 MG tablet Take 0.5 tablets (12.5 mg total) by mouth daily. 12/12/21  Yes Johnson, Clanford L, MD  tamsulosin (FLOMAX) 0.4 MG CAPS capsule Take 1 capsule (0.4 mg total) by mouth daily after supper. 12/11/21  Yes Johnson, Clanford L, MD  traZODone (DESYREL) 50 MG tablet Take 1 tablet (50 mg total) by mouth at bedtime. 12/11/21  Yes Johnson, Clanford L, MD  ciprofloxacin (CIPRO) 500 MG tablet Take 1 tablet (500 mg total) by mouth daily with breakfast. Patient not taking: Reported on 01/23/2022 12/11/21   Murlean Iba, MD      Allergies    Patient has no known allergies.    Review of Systems   Review of Systems  Constitutional:  Negative for chills and fever.  Respiratory:  Positive for shortness of breath. Negative for cough.   Cardiovascular:  Positive for leg swelling. Negative for chest pain.  Gastrointestinal:  Positive for abdominal distention. Negative for abdominal pain, constipation, diarrhea, nausea and vomiting.  Genitourinary:  Negative for dysuria.  Neurological:  Negative for dizziness, syncope, weakness and headaches.    Physical Exam Updated Vital Signs BP (!) 130/54   Pulse 94   Temp 97.9 F (36.6 C) (Oral)   Resp 19   Ht '5\' 10"'$  (1.778 m)   Wt 72.5 kg   SpO2 95%   BMI 22.94 kg/m  Physical Exam Vitals and nursing note reviewed.  Constitutional:      General: He is not in acute distress.    Appearance: He is not toxic-appearing.   Eyes:     General: No scleral icterus.    Conjunctiva/sclera: Conjunctivae normal.  Cardiovascular:     Rate and Rhythm: Normal rate and regular rhythm.     Pulses: Normal pulses.  Pulmonary:     Effort: Pulmonary effort is normal. No respiratory distress.     Breath sounds: No wheezing.  Abdominal:     General: There is distension.     Tenderness: There is no abdominal tenderness. There is no guarding.     Comments: Abdomen is distended, tympanitic.  No abdominal tenderness on exam no guarding or rebound.  Musculoskeletal:     Right lower leg: Edema present.     Left lower leg: Edema present.     Comments: 2+ pitting edema of the bilateral lower extremities.  Non tender. No erythema or excessive warmth  Skin:    General: Skin is warm.     Capillary Refill: Capillary refill takes less than 2 seconds.     Findings: No erythema or rash.  Neurological:     General: No focal deficit present.     Mental Status: He is alert.     ED Results / Procedures / Treatments   Labs (all labs ordered are listed, but only abnormal results are displayed) Labs Reviewed  CBC WITH DIFFERENTIAL/PLATELET - Abnormal; Notable for the following components:      Result Value   RBC 3.08 (*)    Hemoglobin 10.8 (*)    HCT 31.4 (*)    MCV 101.9 (*)    MCH 35.1 (*)    Platelets 65 (*)    All other components within normal limits  COMPREHENSIVE METABOLIC PANEL - Abnormal; Notable for the following components:   Glucose, Bld 176 (*)    Creatinine, Ser 1.73 (*)    Calcium 8.8 (*)    Total Protein 6.4 (*)    Albumin 3.3 (*)    AST 68 (*)    ALT 47 (*)    Total Bilirubin 4.1 (*)    GFR, Estimated 42 (*)    All other components within normal limits  LIPASE, BLOOD - Abnormal; Notable for the following components:   Lipase 54 (*)    All other components within normal limits  BODY FLUID CELL COUNT WITH DIFFERENTIAL - Abnormal; Notable for the following components:   Color, Fluid YELLOW (*)     Monocyte-Macrophage-Serous Fluid 4 (*)    All other components within normal limits  CBG MONITORING, ED - Abnormal; Notable for the following components:   Glucose-Capillary 183 (*)    All other components within normal limits  TROPONIN I (HIGH SENSITIVITY) - Abnormal; Notable for the following components:   Troponin I (High Sensitivity) 19 (*)    All other components within normal limits  CULTURE, BODY FLUID W GRAM STAIN -BOTTLE  GRAM STAIN  MAGNESIUM  ALBUMIN, PLEURAL OR PERITONEAL FLUID   PROTEIN, PLEURAL OR PERITONEAL FLUID  PATHOLOGIST SMEAR REVIEW  TROPONIN I (HIGH SENSITIVITY)    EKG None  Radiology US Paracentesis  Result Date: 01/23/2022 INDICATION: Ascites. EXAM: ULTRASOUND GUIDED DIAGNOSTIC AND THERAPEUTIC PARACENTESIS MEDICATIONS: None. COMPLICATIONS: None immediate. PROCEDURE: Informed written consent was obtained from the patient after a discussion of the risks, benefits and alternatives to treatment. A timeout was performed prior to the initiation of the procedure. Initial ultrasound scanning demonstrates a large amount of ascites within the right lower abdominal quadrant. The right lower abdomen was prepped and draped in the usual sterile fashion. 1% lidocaine was used for local anesthesia. Following this, a 19 gauge, 7-cm, Yueh catheter was introduced. An ultrasound image was saved for documentation purposes. The paracentesis was performed. The catheter was removed and a dressing was applied. The patient tolerated the procedure well without immediate post procedural complication. FINDINGS: A total of approximately 4.9 L of clear yellow fluid was removed. Samples were sent to the laboratory as requested by the clinical team. IMPRESSION: 1. Successful ultrasound-guided paracentesis yielding 4.9 liters of peritoneal fluid. Electronically Signed   By: Titus Dubin M.D.   On: 01/23/2022 15:29   DG Chest 2 View  Result Date: 01/23/2022 CLINICAL DATA:  Provided history:  Shortness of breath. EXAM: CHEST - 2 VIEW COMPARISON:  Prior chest radiographs 12/08/2021 and earlier. FINDINGS: Shallow inspiration radiograph. Heart size within normal limits. Aortic atherosclerosis. Mild ill-defined opacity within the bilateral lung bases, with an appearance favoring atelectasis. No sizable pleural effusion. No evidence of pneumothorax. Degenerative changes of the spine. IMPRESSION: Shallow inspiration radiograph. Mild ill-defined opacity within the bilateral lung bases, with an appearance favoring atelectasis. Aortic Atherosclerosis (ICD10-I70.0). Electronically Signed   By: Kellie Simmering D.O.   On: 01/23/2022 11:46    Procedures Procedures  {Document cardiac monitor, telemetry assessment procedure when appropriate:1}  Medications Ordered in ED Medications - No data to display  ED Course/ Medical Decision Making/ A&P                           Medical Decision Making Pt here with hx of liver failure.  Admitted here recently for decompensated cirrhosis was transferred to [facility] for evaluation of possible transplant but felt not to be a candidate.  He has local PCP, GI and upcoming appointmen with liver clinic in [place] in early October.  Here today for evaluation for possible paracentesis due to abdominal distention and mild shortness of breath.  He received paracentesis approximately 3 weeks ago  On exam, patient is well-appearing nontoxic.  Abdomen is distended and tympanitic.  No increased work of breathing on my exam.  Vital signs are reassuring.  No hypoxia tachycardia or tachypnea.  He does have some pitting edema the bilateral lower extremities that he reports is baseline for him.  Patient's complaints carry high risk for complications and morbidity.  Differential diagnosis would include but not limited to worsening of his liver failure, acute surgical abdomen, infectious process    Amount and/or Complexity of Data Reviewed Labs: ordered.    Details: Labs interpreted  by me, no evidence of leukocytosis.  Hemoglobin near baseline at 10.8.  Mild thrombocytopenia with platelets of 65 this is improved from 2 weeks ago.  Magnesium unremarkable.  Lipase mildly elevated at 54.  Chemistries show serum creatinine of 1.73 this is slightly increased from his baseline.  Transaminases are mildly elevated, but also near baseline his total bili is elevated from 2 weeks ago. Radiology: ordered.     {Document critical care time  when appropriate:1} {Document review of labs and clinical decision tools ie heart score, Chads2Vasc2 etc:1}  {Document your independent review of radiology images, and any outside records:1} {Document your discussion with family members, caretakers, and with consultants:1} {Document social determinants of health affecting pt's care:1} {Document your decision making why or why not admission, treatments were needed:1} Final Clinical Impression(s) / ED Diagnoses Final diagnoses:  Decompensated hepatic cirrhosis ([place])  Hypervolemia, unspecified hypervolemia type    Rx / DC Orders ED Discharge Orders     None

## 2022-01-23 NOTE — Progress Notes (Signed)
PT tolerated right sided paracentesis procedure well today and 4.9 Liters of clear yellow ascites removed with labs collected and sent for processing. PT verbalized understanding of post procedure instructions and returned to ED bed assignment via stretcher at this time with no acute distress noted.

## 2022-01-24 LAB — PATHOLOGIST SMEAR REVIEW

## 2022-01-25 NOTE — Patient Instructions (Addendum)
Edward [name] [facility]  01/25/2022     '@PREFPERIOPPHARMACY'$ @   Your procedure is scheduled on  02/01/2022.   Report to [facility] at  6: 49 A.M.   Call this number if you have problems the morning of surgery:  [phone]   Remember:  Follow the diet instructions given to you by the office.        Use your inhaler before you come and bring your rescue inhaler with you.     Take these medicines the morning of surgery with A SIP OF WATER                                                     protonix.     Do not wear jewelry, make-up or nail polish.  Do not wear lotions, powders, or perfumes, or deodorant.  Do not shave 48 hours prior to surgery.  Men may shave face and neck.  Do not bring valuables to the hospital.  [facility] is not responsible for any belongings or valuables.  Contacts, dentures or bridgework may not be worn into surgery.  Leave your suitcase in the car.  After surgery it may be brought to your room.  For patients admitted to the hospital, discharge time will be determined by your treatment team.  Patients discharged the day of surgery will not be allowed to drive home and must have someone with them for 24 hours.    Special instructions:   DO NOT smoke tobacco or vape for 24 hours before your procedure.  Please read over the following fact sheets that you were given. Anesthesia Post-op Instructions and Care and Recovery After Surgery      Upper Endoscopy, Adult, Care After After the procedure, it is common to have a sore throat. It is also common to have: Mild stomach pain or discomfort. Bloating. Nausea. Follow these instructions at home: The instructions below may help you care for yourself at home. Your health care provider may give you more instructions. If you have questions, ask your health care provider. If you were given a sedative during the procedure, it can affect you for several hours. Do not drive or operate machinery until your  health care provider says that it is safe. If you will be going home right after the procedure, plan to have a responsible adult: Take you home from the hospital or clinic. You will not be allowed to drive. Care for you for the time you are told. Follow instructions from your health care provider about what you may eat and drink. Return to your normal activities as told by your health care provider. Ask your health care provider what activities are safe for you. Take over-the-counter and prescription medicines only as told by your health care provider. Contact a health care provider if you: Have a sore throat that lasts longer than one day. Have trouble swallowing. Have a fever. Get help right away if you: Vomit blood or your vomit looks like coffee grounds. Have bloody, black, or tarry stools. Have a very bad sore throat or you cannot swallow. Have difficulty breathing or very bad pain in your chest or abdomen. These symptoms may be an emergency. Get help right away. Call 911. Do not wait to see if the symptoms will go away. Do not  drive yourself to the hospital. Summary After the procedure, it is common to have a sore throat, mild stomach discomfort, bloating, and nausea. If you were given a sedative during the procedure, it can affect you for several hours. Do not drive until your health care provider says that it is safe. Follow instructions from your health care provider about what you may eat and drink. Return to your normal activities as told by your health care provider. This information is not intended to replace advice given to you by your health care provider. Make sure you discuss any questions you have with your health care provider. Document Revised: 08/02/2021 Document Reviewed: 08/02/2021 Elsevier Patient Education  [place] After This sheet gives you information about how to care for yourself after your procedure. Your health  care provider may also give you more specific instructions. If you have problems or questions, contact your health care provider. What can I expect after the procedure? After the procedure, it is common to have: Tiredness. Forgetfulness about what happened after the procedure. Impaired judgment for important decisions. Nausea or vomiting. Some difficulty with balance. Follow these instructions at home: For the time period you were told by your health care provider:     Rest as needed. Do not participate in activities where you could fall or become injured. Do not drive or use machinery. Do not drink alcohol. Do not take sleeping pills or medicines that cause drowsiness. Do not make important decisions or sign legal documents. Do not take care of children on your own. Eating and drinking Follow the diet that is recommended by your health care provider. Drink enough fluid to keep your urine pale yellow. If you vomit: Drink water, juice, or soup when you can drink without vomiting. Make sure you have little or no nausea before eating solid foods. General instructions Have a responsible adult stay with you for the time you are told. It is important to have someone help care for you until you are awake and alert. Take over-the-counter and prescription medicines only as told by your health care provider. If you have sleep apnea, surgery and certain medicines can increase your risk for breathing problems. Follow instructions from your health care provider about wearing your sleep device: Anytime you are sleeping, including during daytime naps. While taking prescription pain medicines, sleeping medicines, or medicines that make you drowsy. Avoid smoking. Keep all follow-up visits as told by your health care provider. This is important. Contact a health care provider if: You keep feeling nauseous or you keep vomiting. You feel light-headed. You are still sleepy or having trouble with  balance after 24 hours. You develop a rash. You have a fever. You have redness or swelling around the IV site. Get help right away if: You have trouble breathing. You have new-onset confusion at home. Summary For several hours after your procedure, you may feel tired. You may also be forgetful and have poor judgment. Have a responsible adult stay with you for the time you are told. It is important to have someone help care for you until you are awake and alert. Rest as told. Do not drive or operate machinery. Do not drink alcohol or take sleeping pills. Get help right away if you have trouble breathing, or if you suddenly become confused. This information is not intended to replace advice given to you by your health care provider. Make sure you discuss any questions you have with your health care  provider. Document Revised: 03/28/2021 Document Reviewed: 03/26/2019 Elsevier Patient Education  [place].

## 2022-01-28 LAB — CULTURE, BODY FLUID W GRAM STAIN -BOTTLE
Culture: NO GROWTH
Special Requests: ADEQUATE

## 2022-01-30 ENCOUNTER — Encounter (HOSPITAL_COMMUNITY)
Admission: RE | Admit: 2022-01-30 | Discharge: 2022-01-30 | Disposition: A | Discharge status: Home / Self Care | Payer: 59 | Source: Ambulatory Visit | Attending: Internal Medicine | Admitting: Internal Medicine

## 2022-02-01 ENCOUNTER — Ambulatory Visit (HOSPITAL_COMMUNITY)
Admission: RE | Admit: 2022-02-01 | Discharge: 2022-02-01 | Disposition: A | Discharge status: Home / Self Care | Payer: 59 | Attending: Internal Medicine | Admitting: Internal Medicine

## 2022-02-01 ENCOUNTER — Ambulatory Visit (HOSPITAL_BASED_OUTPATIENT_CLINIC_OR_DEPARTMENT_OTHER): Payer: 59 | Admitting: Anesthesiology

## 2022-02-01 ENCOUNTER — Telehealth: Payer: Self-pay | Admitting: Internal Medicine

## 2022-02-01 ENCOUNTER — Encounter (HOSPITAL_COMMUNITY): Payer: Self-pay

## 2022-02-01 ENCOUNTER — Encounter (HOSPITAL_COMMUNITY)
Admission: RE | Disposition: A | Discharge status: Home / Self Care | Payer: Self-pay | Source: Home / Self Care | Attending: Internal Medicine

## 2022-02-01 ENCOUNTER — Ambulatory Visit (HOSPITAL_COMMUNITY): Payer: 59 | Admitting: Anesthesiology

## 2022-02-01 DIAGNOSIS — E782 Mixed hyperlipidemia: Secondary | ICD-10-CM | POA: Insufficient documentation

## 2022-02-01 DIAGNOSIS — E1122 Type 2 diabetes mellitus with diabetic chronic kidney disease: Secondary | ICD-10-CM | POA: Diagnosis not present

## 2022-02-01 DIAGNOSIS — D696 Thrombocytopenia, unspecified: Secondary | ICD-10-CM | POA: Insufficient documentation

## 2022-02-01 DIAGNOSIS — D649 Anemia, unspecified: Secondary | ICD-10-CM | POA: Insufficient documentation

## 2022-02-01 DIAGNOSIS — Z87891 Personal history of nicotine dependence: Secondary | ICD-10-CM | POA: Insufficient documentation

## 2022-02-01 DIAGNOSIS — E610 Copper deficiency: Secondary | ICD-10-CM | POA: Insufficient documentation

## 2022-02-01 DIAGNOSIS — D539 Nutritional anemia, unspecified: Secondary | ICD-10-CM | POA: Insufficient documentation

## 2022-02-01 DIAGNOSIS — I2510 Atherosclerotic heart disease of native coronary artery without angina pectoris: Secondary | ICD-10-CM | POA: Insufficient documentation

## 2022-02-01 DIAGNOSIS — I129 Hypertensive chronic kidney disease with stage 1 through stage 4 chronic kidney disease, or unspecified chronic kidney disease: Secondary | ICD-10-CM | POA: Insufficient documentation

## 2022-02-01 DIAGNOSIS — K7460 Unspecified cirrhosis of liver: Secondary | ICD-10-CM | POA: Diagnosis not present

## 2022-02-01 DIAGNOSIS — I864 Gastric varices: Secondary | ICD-10-CM | POA: Insufficient documentation

## 2022-02-01 DIAGNOSIS — I8510 Secondary esophageal varices without bleeding: Secondary | ICD-10-CM | POA: Insufficient documentation

## 2022-02-01 DIAGNOSIS — R0602 Shortness of breath: Secondary | ICD-10-CM | POA: Insufficient documentation

## 2022-02-01 DIAGNOSIS — N183 Chronic kidney disease, stage 3 unspecified: Secondary | ICD-10-CM | POA: Insufficient documentation

## 2022-02-01 DIAGNOSIS — E1139 Type 2 diabetes mellitus with other diabetic ophthalmic complication: Secondary | ICD-10-CM | POA: Diagnosis not present

## 2022-02-01 DIAGNOSIS — K219 Gastro-esophageal reflux disease without esophagitis: Secondary | ICD-10-CM | POA: Diagnosis not present

## 2022-02-01 DIAGNOSIS — H42 Glaucoma in diseases classified elsewhere: Secondary | ICD-10-CM | POA: Insufficient documentation

## 2022-02-01 DIAGNOSIS — K766 Portal hypertension: Secondary | ICD-10-CM | POA: Diagnosis not present

## 2022-02-01 HISTORY — PX: ESOPHAGOGASTRODUODENOSCOPY (EGD) WITH PROPOFOL: SHX5813

## 2022-02-01 HISTORY — PX: ESOPHAGEAL BANDING: SHX5518

## 2022-02-01 LAB — GLUCOSE, CAPILLARY: Glucose-Capillary: 162 mg/dL — ABNORMAL HIGH (ref 70–99)

## 2022-02-01 SURGERY — ESOPHAGOGASTRODUODENOSCOPY (EGD) WITH PROPOFOL
Anesthesia: General

## 2022-02-01 MED ORDER — PANTOPRAZOLE SODIUM 40 MG PO TBEC: 40.0000 mg | DELAYED_RELEASE_TABLET | Freq: Two times a day (BID) | ORAL | 3 refills | Status: AC

## 2022-02-01 MED ORDER — PHENYLEPHRINE HCL (PRESSORS) 10 MG/ML IV SOLN
INTRAVENOUS | Status: DC | PRN
  Administered 2022-02-01: 160 ug via INTRAVENOUS

## 2022-02-01 MED ORDER — LACTATED RINGERS IV SOLN: INTRAVENOUS | Status: DC

## 2022-02-01 MED ORDER — LIDOCAINE HCL (CARDIAC) PF 50 MG/5ML IV SOSY
PREFILLED_SYRINGE | INTRAVENOUS | Status: DC | PRN
  Administered 2022-02-01: 80 mg via INTRAVENOUS

## 2022-02-01 MED ORDER — PROPOFOL 10 MG/ML IV BOLUS
INTRAVENOUS | Status: DC | PRN
  Administered 2022-02-01: 100 mg via INTRAVENOUS
  Administered 2022-02-01: 40 mg via INTRAVENOUS

## 2022-02-01 NOTE — Anesthesia Procedure Notes (Signed)
Date/Time: 02/01/2022 8:31 AM  Performed by: Vista Deck, CRNAPre-anesthesia Checklist: Patient identified, Emergency Drugs available, Suction available, Timeout performed and Patient being monitored Patient Re-evaluated:Patient Re-evaluated prior to induction Oxygen Delivery Method: Nasal Cannula

## 2022-02-01 NOTE — Discharge Instructions (Addendum)
EGD Discharge instructions Please read the instructions outlined below and refer to this sheet in the next few weeks. These discharge instructions provide you with general information on caring for yourself after you leave the hospital. Your doctor may also give you specific instructions. While your treatment has been planned according to the most current medical practices available, unavoidable complications occasionally occur. If you have any problems or questions after discharge, please call your doctor. ACTIVITY You may resume your regular activity but move at a slower pace for the next 24 hours.  Take frequent rest periods for the next 24 hours.  Walking will help expel (get rid of) the air and reduce the bloated feeling in your abdomen.  No driving for 24 hours (because of the anesthesia (medicine) used during the test).  You may shower.  Do not sign any important legal documents or operate any machinery for 24 hours (because of the anesthesia used during the test).  NUTRITION Drink plenty of fluids.  You may resume your normal diet.  Begin with a light meal and progress to your normal diet.  Avoid alcoholic beverages for 24 hours or as instructed by your caregiver.  MEDICATIONS You may resume your normal medications unless your caregiver tells you otherwise.  WHAT YOU CAN EXPECT TODAY You may experience abdominal discomfort such as a feeling of fullness or "gas" pains.  FOLLOW-UP Your doctor will discuss the results of your test with you.  SEEK IMMEDIATE MEDICAL ATTENTION IF ANY OF THE FOLLOWING OCCUR: Excessive nausea (feeling sick to your stomach) and/or vomiting.  Severe abdominal pain and distention (swelling).  Trouble swallowing.  Temperature over 101 F (37.8 C).  Rectal bleeding or vomiting of blood.    Your upper endoscopy revealed large esophageal varices and one isolated varix in your stomach as well.  Stomach and small bowel otherwise normal.  I successfully placed 3  rubber bands on the esophageal varices with good treatment.  Keep your diet soft for 2 to 3 days.  I am going to increase your pantoprazole to twice daily and have sent this new prescription to your pharmacy.  Follow-up with hepatology in [place] as scheduled.  Follow-up with [place] in our clinic in 6 weeks.  We will need to repeat EGD in 4 weeks to evaluate response to therapy.  Left a message for office to call you to schedule your follow up office visit in 6 weeks and schedule EGD in 4 weeks.   I hope you have a great rest of your week!  Elon Alas. Abbey Chatters, D.O. Gastroenterology and Hepatology [facility]

## 2022-02-01 NOTE — Telephone Encounter (Signed)
LMOVM to call back 

## 2022-02-01 NOTE — Telephone Encounter (Signed)
Just completed EGD on this patient.  He had large esophageal varices status post banding x3.  Needs repeat EGD in 4 weeks ASA 3.  Can we please arrange?  Thank you

## 2022-02-01 NOTE — Transfer of Care (Signed)
Immediate Anesthesia Transfer of Care Note  Patient: Edward [name] [facility]  Procedure(s) Performed: ESOPHAGOGASTRODUODENOSCOPY (EGD) WITH PROPOFOL ESOPHAGEAL BANDING  Patient Location: PACU  Anesthesia Type:General  Level of Consciousness: awake, drowsy and patient cooperative  Airway & Oxygen Therapy: Patient Spontanous Breathing  Post-op Assessment: Report given to RN, Post -op Vital signs reviewed and stable and Patient moving all extremities X 4  Post vital signs: Reviewed and stable  Last Vitals:  Vitals Value Taken Time  BP 94/43 02/01/22 0846  Temp 36.4 C 02/01/22 0846  Pulse 71 02/01/22 0846  Resp 14 02/01/22 0846  SpO2 99 % 02/01/22 0846    Last Pain:  Vitals:   02/01/22 0846  TempSrc: Oral  PainSc:          Complications: No notable events documented.

## 2022-02-01 NOTE — Interval H&P Note (Signed)
History and Physical Interval Note:  02/01/2022 8:24 AM  Edward [name]  has presented today for surgery, with the diagnosis of cirrhosis.  The various methods of treatment have been discussed with the patient and family. After consideration of risks, benefits and other options for treatment, the patient has consented to  Procedure(s) with comments: ESOPHAGOGASTRODUODENOSCOPY (EGD) WITH PROPOFOL (N/A) - 12:30 am as a surgical intervention.  The patient's history has been reviewed, patient examined, no change in status, stable for surgery.  I have reviewed the patient's chart and labs.  Questions were answered to the patient's satisfaction.     Eloise Harman

## 2022-02-01 NOTE — Anesthesia Preprocedure Evaluation (Signed)
Anesthesia Evaluation  Patient identified by MRN, date of birth, ID band Patient awake    Reviewed: Allergy & Precautions, H&P , NPO status , Patient's Chart, lab work & pertinent test results, reviewed documented beta blocker date and time   Airway Mallampati: II  TM Distance: >3 FB Neck ROM: full    Dental no notable dental hx.    Pulmonary shortness of breath, former smoker,    Pulmonary exam normal breath sounds clear to auscultation       Cardiovascular Exercise Tolerance: Good hypertension, + CAD   Rhythm:regular Rate:Normal     Neuro/Psych  Neuromuscular disease negative psych ROS   GI/Hepatic Neg liver ROS, GERD  Medicated,  Endo/Other  negative endocrine ROSdiabetes, Type 2  Renal/GU CRFRenal disease  negative genitourinary   Musculoskeletal   Abdominal   Peds  Hematology  (+) Blood dyscrasia, anemia ,   Anesthesia Other Findings   Reproductive/Obstetrics negative OB ROS                             Anesthesia Physical Anesthesia Plan  ASA: 3  Anesthesia Plan: General   Post-op Pain Management:    Induction:   PONV Risk Score and Plan: Propofol infusion  Airway Management Planned:   Additional Equipment:   Intra-op Plan:   Post-operative Plan:   Informed Consent: I have reviewed the patients History and Physical, chart, labs and discussed the procedure including the risks, benefits and alternatives for the proposed anesthesia with the patient or authorized representative who has indicated his/her understanding and acceptance.     Dental Advisory Given  Plan Discussed with: CRNA  Anesthesia Plan Comments:         Anesthesia Quick Evaluation

## 2022-02-01 NOTE — Telephone Encounter (Signed)
Per Dr Abbey Chatters patient had EGD today and he wants patient to repeat EGD in 4 weeks and follow up in 6 weeks. I have made [place] for 03/16/2022 at 10 am with LSL.

## 2022-02-01 NOTE — Op Note (Addendum)
[facility] Patient Name: Edward [name] Procedure Date: 02/01/2022 8:24 AM MRN: [id number] Date of Birth: July 22, 1950 Attending MD: Elon Alas. Abbey Chatters DO CSN: [id number] Age: 71 Admit Type: Outpatient Procedure:                Upper GI endoscopy Indications:              Cirrhosis with suspected esophageal varices Providers:                Elon Alas. Abbey Chatters, DO, Caprice Kluver, Everardo Pacific Referring MD:              Medicines:                See the Anesthesia note for documentation of the                            administered medications Complications:            No immediate complications. Estimated Blood Loss:     Estimated blood loss was minimal. Procedure:                Pre-Anesthesia Assessment:                           - The anesthesia plan was to use monitored                            anesthesia care (MAC).                           After obtaining informed consent, the endoscope was                            passed under direct vision. Throughout the                            procedure, the patient's blood pressure, pulse, and                            oxygen saturations were monitored continuously. The                            [id number]-H190 ([id number]) scope was introduced through the                            mouth, and advanced to the second part of duodenum.                            The upper GI endoscopy was accomplished without                            difficulty. The patient tolerated the procedure                            well. Scope In: 8:35:39 AM Scope Out: 8:42:25 AM Total Procedure Duration: 0 hours 6 minutes 46 seconds  Findings:      Four columns of large (> 5 mm)  varices with no bleeding and no stigmata       of recent bleeding were found in the middle third of the esophagus and       in the lower third of the esophagus,. No red wale signs were present.       Three bands were successfully placed with complete eradication,       resulting in  deflation of varices. There was no bleeding at the end of       the procedure.      Type 1 isolated gastric varices (IGV1, varices located in the fundus)       with no bleeding were found in the gastric fundus. There were no       stigmata of recent bleeding.      The duodenal bulb, first portion of the duodenum and second portion of       the duodenum were normal. Impression:               - Large (> 5 mm) esophageal varices with no                            bleeding and no stigmata of recent bleeding.                            Completely eradicated. Banded.                           - Type 1 isolated gastric varices (IGV1, varices                            located in the fundus), without bleeding.                           - Normal duodenal bulb, first portion of the                            duodenum and second portion of the duodenum.                           - No specimens collected. Moderate Sedation:      Per Anesthesia Care Recommendation:           - Patient has a contact number available for                            emergencies. The signs and symptoms of potential                            delayed complications were discussed with the                            patient. Return to normal activities tomorrow.                            Written discharge instructions were provided to the  patient.                           - Soft diet.                           - Use a proton pump inhibitor PO BID.                           - Repeat upper endoscopy in 4 weeks to evaluate the                            response to therapy.                           - Return to GI office in 6 weeks. Procedure Code(s):        --- Professional ---                           [phone], Esophagogastroduodenoscopy, flexible,                            transoral; with band ligation of esophageal/gastric                            varices Diagnosis Code(s):        ---  Professional ---                           K74.60, Unspecified cirrhosis of liver                           I85.10, Secondary esophageal varices without                            bleeding                           I86.4, Gastric varices CPT copyright 2019 [employer]. All rights reserved. The codes documented in this report are preliminary and upon coder review may  be revised to meet current compliance requirements. Elon Alas. Abbey Chatters, DO [place] Abbey Chatters, DO 02/01/2022 8:45:55 AM This report has been signed electronically. Number of Addenda: 0

## 2022-02-01 NOTE — Telephone Encounter (Signed)
Dr. Abbey Chatters already sent a previous note to me. Will schedule later today once he is home

## 2022-02-02 NOTE — Anesthesia Postprocedure Evaluation (Signed)
Anesthesia Post Note  Patient: Youcef Klas [facility]  Procedure(s) Performed: ESOPHAGOGASTRODUODENOSCOPY (EGD) WITH PROPOFOL ESOPHAGEAL BANDING  Patient location during evaluation: Phase II Anesthesia Type: General Level of consciousness: awake Pain management: pain level controlled Vital Signs Assessment: post-procedure vital signs reviewed and stable Respiratory status: spontaneous breathing and respiratory function stable Cardiovascular status: blood pressure returned to baseline and stable Postop Assessment: no headache and no apparent nausea or vomiting Anesthetic complications: no Comments: Late entry   No notable events documented.   Last Vitals:  Vitals:   02/01/22 0745 02/01/22 0846  BP:  (!) 94/43  Pulse: 82 71  Resp: 14 14  Temp:  (!) 36.4 C  SpO2: 100% 99%    Last Pain:  Vitals:   02/02/22 0942  TempSrc:   PainSc: 0-No pain                 Louann Sjogren

## 2022-02-04 ENCOUNTER — Other Ambulatory Visit: Payer: Self-pay

## 2022-02-04 ENCOUNTER — Emergency Department (HOSPITAL_COMMUNITY): Payer: 59

## 2022-02-04 ENCOUNTER — Inpatient Hospital Stay (HOSPITAL_COMMUNITY)
Admission: EM | Admit: 2022-02-04 | Discharge: 2022-02-08 | DRG: 872 | Disposition: A | Discharge status: Home / Self Care | Payer: 59 | Attending: Internal Medicine | Admitting: Internal Medicine

## 2022-02-04 ENCOUNTER — Encounter (HOSPITAL_COMMUNITY): Payer: Self-pay | Admitting: *Deleted

## 2022-02-04 DIAGNOSIS — E782 Mixed hyperlipidemia: Secondary | ICD-10-CM | POA: Diagnosis present

## 2022-02-04 DIAGNOSIS — R509 Fever, unspecified: Secondary | ICD-10-CM | POA: Diagnosis not present

## 2022-02-04 DIAGNOSIS — I252 Old myocardial infarction: Secondary | ICD-10-CM

## 2022-02-04 DIAGNOSIS — Z1611 Resistance to penicillins: Secondary | ICD-10-CM | POA: Diagnosis present

## 2022-02-04 DIAGNOSIS — Z7985 Long-term (current) use of injectable non-insulin antidiabetic drugs: Secondary | ICD-10-CM

## 2022-02-04 DIAGNOSIS — E1165 Type 2 diabetes mellitus with hyperglycemia: Secondary | ICD-10-CM | POA: Diagnosis not present

## 2022-02-04 DIAGNOSIS — R188 Other ascites: Secondary | ICD-10-CM | POA: Diagnosis present

## 2022-02-04 DIAGNOSIS — E610 Copper deficiency: Secondary | ICD-10-CM | POA: Diagnosis present

## 2022-02-04 DIAGNOSIS — N183 Chronic kidney disease, stage 3 unspecified: Secondary | ICD-10-CM | POA: Diagnosis present

## 2022-02-04 DIAGNOSIS — D696 Thrombocytopenia, unspecified: Secondary | ICD-10-CM

## 2022-02-04 DIAGNOSIS — B9620 Unspecified Escherichia coli [E. coli] as the cause of diseases classified elsewhere: Secondary | ICD-10-CM | POA: Diagnosis present

## 2022-02-04 DIAGNOSIS — A419 Sepsis, unspecified organism: Secondary | ICD-10-CM | POA: Diagnosis not present

## 2022-02-04 DIAGNOSIS — R791 Abnormal coagulation profile: Secondary | ICD-10-CM | POA: Diagnosis present

## 2022-02-04 DIAGNOSIS — I8500 Esophageal varices without bleeding: Secondary | ICD-10-CM | POA: Diagnosis present

## 2022-02-04 DIAGNOSIS — I2510 Atherosclerotic heart disease of native coronary artery without angina pectoris: Secondary | ICD-10-CM | POA: Diagnosis present

## 2022-02-04 DIAGNOSIS — Z8249 Family history of ischemic heart disease and other diseases of the circulatory system: Secondary | ICD-10-CM

## 2022-02-04 DIAGNOSIS — N390 Urinary tract infection, site not specified: Secondary | ICD-10-CM | POA: Diagnosis present

## 2022-02-04 DIAGNOSIS — E1122 Type 2 diabetes mellitus with diabetic chronic kidney disease: Secondary | ICD-10-CM | POA: Diagnosis present

## 2022-02-04 DIAGNOSIS — Z833 Family history of diabetes mellitus: Secondary | ICD-10-CM

## 2022-02-04 DIAGNOSIS — I9589 Other hypotension: Secondary | ICD-10-CM | POA: Diagnosis present

## 2022-02-04 DIAGNOSIS — J9811 Atelectasis: Secondary | ICD-10-CM | POA: Diagnosis present

## 2022-02-04 DIAGNOSIS — Z79891 Long term (current) use of opiate analgesic: Secondary | ICD-10-CM

## 2022-02-04 DIAGNOSIS — Z1629 Resistance to other single specified antibiotic: Secondary | ICD-10-CM | POA: Diagnosis present

## 2022-02-04 DIAGNOSIS — K7460 Unspecified cirrhosis of liver: Secondary | ICD-10-CM

## 2022-02-04 DIAGNOSIS — E871 Hypo-osmolality and hyponatremia: Secondary | ICD-10-CM | POA: Diagnosis present

## 2022-02-04 DIAGNOSIS — I10 Essential (primary) hypertension: Secondary | ICD-10-CM | POA: Diagnosis not present

## 2022-02-04 DIAGNOSIS — K7290 Hepatic failure, unspecified without coma: Secondary | ICD-10-CM | POA: Diagnosis not present

## 2022-02-04 DIAGNOSIS — E8770 Fluid overload, unspecified: Secondary | ICD-10-CM | POA: Diagnosis present

## 2022-02-04 DIAGNOSIS — N1832 Chronic kidney disease, stage 3b: Secondary | ICD-10-CM | POA: Diagnosis not present

## 2022-02-04 DIAGNOSIS — H409 Unspecified glaucoma: Secondary | ICD-10-CM | POA: Diagnosis present

## 2022-02-04 DIAGNOSIS — D631 Anemia in chronic kidney disease: Secondary | ICD-10-CM | POA: Diagnosis present

## 2022-02-04 DIAGNOSIS — E1121 Type 2 diabetes mellitus with diabetic nephropathy: Secondary | ICD-10-CM

## 2022-02-04 DIAGNOSIS — Z87891 Personal history of nicotine dependence: Secondary | ICD-10-CM

## 2022-02-04 DIAGNOSIS — Z79899 Other long term (current) drug therapy: Secondary | ICD-10-CM

## 2022-02-04 DIAGNOSIS — I129 Hypertensive chronic kidney disease with stage 1 through stage 4 chronic kidney disease, or unspecified chronic kidney disease: Secondary | ICD-10-CM | POA: Diagnosis present

## 2022-02-04 DIAGNOSIS — E876 Hypokalemia: Secondary | ICD-10-CM | POA: Diagnosis present

## 2022-02-04 DIAGNOSIS — Z955 Presence of coronary angioplasty implant and graft: Secondary | ICD-10-CM

## 2022-02-04 DIAGNOSIS — Z1152 Encounter for screening for COVID-19: Secondary | ICD-10-CM

## 2022-02-04 DIAGNOSIS — D75838 Other thrombocytosis: Secondary | ICD-10-CM | POA: Diagnosis present

## 2022-02-04 LAB — URINALYSIS, ROUTINE W REFLEX MICROSCOPIC
Bacteria, UA: NONE SEEN
Bilirubin Urine: NEGATIVE
Glucose, UA: NEGATIVE mg/dL
Ketones, ur: NEGATIVE mg/dL
Leukocytes,Ua: NEGATIVE
Nitrite: POSITIVE — AB
Protein, ur: NEGATIVE mg/dL
Specific Gravity, Urine: 1.009 (ref 1.005–1.030)
pH: 5 (ref 5.0–8.0)

## 2022-02-04 LAB — HEPATIC FUNCTION PANEL
ALT: 43 U/L (ref 0–44)
AST: 56 U/L — ABNORMAL HIGH (ref 15–41)
Albumin: 3 g/dL — ABNORMAL LOW (ref 3.5–5.0)
Alkaline Phosphatase: 73 U/L (ref 38–126)
Bilirubin, Direct: 1.4 mg/dL — ABNORMAL HIGH (ref 0.0–0.2)
Indirect Bilirubin: 3.6 mg/dL — ABNORMAL HIGH (ref 0.3–0.9)
Total Bilirubin: 5 mg/dL — ABNORMAL HIGH (ref 0.3–1.2)
Total Protein: 6.4 g/dL — ABNORMAL LOW (ref 6.5–8.1)

## 2022-02-04 LAB — BASIC METABOLIC PANEL
Anion gap: 11 (ref 5–15)
BUN: 28 mg/dL — ABNORMAL HIGH (ref 8–23)
CO2: 24 mmol/L (ref 22–32)
Calcium: 8.7 mg/dL — ABNORMAL LOW (ref 8.9–10.3)
Chloride: 96 mmol/L — ABNORMAL LOW (ref 98–111)
Creatinine, Ser: 2.04 mg/dL — ABNORMAL HIGH (ref 0.61–1.24)
GFR, Estimated: 35 mL/min — ABNORMAL LOW (ref >60)
Glucose, Bld: 198 mg/dL — ABNORMAL HIGH (ref 70–99)
Potassium: 3.9 mmol/L (ref 3.5–5.1)
Sodium: 131 mmol/L — ABNORMAL LOW (ref 135–145)

## 2022-02-04 LAB — CBC WITH DIFFERENTIAL/PLATELET
Abs Immature Granulocytes: 0.01 10*3/uL (ref 0.00–0.07)
Basophils Absolute: 0 10*3/uL (ref 0.0–0.1)
Basophils Relative: 1 %
Eosinophils Absolute: 0 10*3/uL (ref 0.0–0.5)
Eosinophils Relative: 0 %
HCT: 32 % — ABNORMAL LOW (ref 39.0–52.0)
Hemoglobin: 11.2 g/dL — ABNORMAL LOW (ref 13.0–17.0)
Immature Granulocytes: 0 %
Lymphocytes Relative: 9 %
Lymphs Abs: 0.4 10*3/uL — ABNORMAL LOW (ref 0.7–4.0)
MCH: 35.4 pg — ABNORMAL HIGH (ref 26.0–34.0)
MCHC: 35 g/dL (ref 30.0–36.0)
MCV: 101.3 fL — ABNORMAL HIGH (ref 80.0–100.0)
Monocytes Absolute: 0.5 10*3/uL (ref 0.1–1.0)
Monocytes Relative: 12 %
Neutro Abs: 3.4 10*3/uL (ref 1.7–7.7)
Neutrophils Relative %: 78 %
Platelets: 54 10*3/uL — ABNORMAL LOW (ref 150–400)
RBC: 3.16 MIL/uL — ABNORMAL LOW (ref 4.22–5.81)
RDW: 14.5 % (ref 11.5–15.5)
WBC: 4.4 10*3/uL (ref 4.0–10.5)
nRBC: 0 % (ref 0.0–0.2)

## 2022-02-04 LAB — PROTIME-INR
INR: 1.7 — ABNORMAL HIGH (ref 0.8–1.2)
Prothrombin Time: 19.6 seconds — ABNORMAL HIGH (ref 11.4–15.2)

## 2022-02-04 LAB — LIPASE, BLOOD: Lipase: 50 U/L (ref 11–51)

## 2022-02-04 LAB — CBG MONITORING, ED
Glucose-Capillary: 199 mg/dL — ABNORMAL HIGH (ref 70–99)
Glucose-Capillary: 223 mg/dL — ABNORMAL HIGH (ref 70–99)

## 2022-02-04 LAB — RESP PANEL BY RT-PCR (FLU A&B, COVID) ARPGX2
Influenza A by PCR: NEGATIVE
Influenza B by PCR: NEGATIVE
SARS Coronavirus 2 by RT PCR: NEGATIVE

## 2022-02-04 MED ORDER — SODIUM CHLORIDE 0.9 % IV SOLN
2.0000 g | INTRAVENOUS | Status: DC
  Administered 2022-02-05 – 2022-02-07 (×3): 2 g via INTRAVENOUS
  Filled 2022-02-04 (×3): qty 20

## 2022-02-04 MED ORDER — FUROSEMIDE 40 MG PO TABS
40.0000 mg | ORAL_TABLET | Freq: Every day | ORAL | Status: DC
  Administered 2022-02-04 – 2022-02-08 (×5): 40 mg via ORAL
  Filled 2022-02-04 (×5): qty 1

## 2022-02-04 MED ORDER — ONDANSETRON HCL 4 MG/2ML IJ SOLN
4.0000 mg | Freq: Four times a day (QID) | INTRAMUSCULAR | Status: DC | PRN
  Administered 2022-02-06 (×2): 4 mg via INTRAVENOUS
  Filled 2022-02-04 (×2): qty 2

## 2022-02-04 MED ORDER — TAMSULOSIN HCL 0.4 MG PO CAPS
0.4000 mg | ORAL_CAPSULE | Freq: Every day | ORAL | Status: DC
  Administered 2022-02-05 – 2022-02-07 (×3): 0.4 mg via ORAL
  Filled 2022-02-04 (×3): qty 1

## 2022-02-04 MED ORDER — ONDANSETRON HCL 4 MG PO TABS: 4.0000 mg | ORAL_TABLET | Freq: Four times a day (QID) | ORAL | Status: DC | PRN

## 2022-02-04 MED ORDER — LACTULOSE 10 GM/15ML PO SOLN
10.0000 g | Freq: Two times a day (BID) | ORAL | Status: DC
  Administered 2022-02-04 – 2022-02-08 (×8): 10 g via ORAL
  Filled 2022-02-04 (×8): qty 30

## 2022-02-04 MED ORDER — MIDODRINE HCL 5 MG PO TABS
20.0000 mg | ORAL_TABLET | Freq: Three times a day (TID) | ORAL | Status: DC
  Administered 2022-02-05 – 2022-02-08 (×10): 20 mg via ORAL
  Filled 2022-02-04 (×10): qty 4

## 2022-02-04 MED ORDER — SODIUM CHLORIDE 0.9 % IV SOLN
1.0000 g | Freq: Once | INTRAVENOUS | Status: AC
  Administered 2022-02-04: 1 g via INTRAVENOUS
  Filled 2022-02-04: qty 10

## 2022-02-04 MED ORDER — SPIRONOLACTONE 12.5 MG HALF TABLET
12.5000 mg | ORAL_TABLET | Freq: Every day | ORAL | Status: DC
  Administered 2022-02-05 – 2022-02-08 (×4): 12.5 mg via ORAL
  Filled 2022-02-04 (×4): qty 1

## 2022-02-04 MED ORDER — PANTOPRAZOLE SODIUM 40 MG PO TBEC
40.0000 mg | DELAYED_RELEASE_TABLET | Freq: Two times a day (BID) | ORAL | Status: DC
  Administered 2022-02-04 – 2022-02-08 (×8): 40 mg via ORAL
  Filled 2022-02-04 (×8): qty 1

## 2022-02-04 MED ORDER — INSULIN ASPART 100 UNIT/ML IJ SOLN
0.0000 [IU] | Freq: Three times a day (TID) | INTRAMUSCULAR | Status: DC
  Administered 2022-02-05 – 2022-02-06 (×4): 2 [IU] via SUBCUTANEOUS
  Administered 2022-02-06 (×2): 5 [IU] via SUBCUTANEOUS
  Administered 2022-02-07: 3 [IU] via SUBCUTANEOUS
  Administered 2022-02-07: 5 [IU] via SUBCUTANEOUS
  Administered 2022-02-07 – 2022-02-08 (×2): 2 [IU] via SUBCUTANEOUS

## 2022-02-04 MED ORDER — ACETAMINOPHEN 650 MG RE SUPP: 650.0000 mg | Freq: Four times a day (QID) | RECTAL | Status: DC | PRN

## 2022-02-04 MED ORDER — ACETAMINOPHEN 325 MG PO TABS
650.0000 mg | ORAL_TABLET | Freq: Four times a day (QID) | ORAL | Status: DC | PRN
  Filled 2022-02-04: qty 2

## 2022-02-04 MED ORDER — INSULIN ASPART 100 UNIT/ML IJ SOLN
0.0000 [IU] | Freq: Every day | INTRAMUSCULAR | Status: DC
  Administered 2022-02-04 – 2022-02-06 (×3): 2 [IU] via SUBCUTANEOUS
  Administered 2022-02-07: 3 [IU] via SUBCUTANEOUS
  Filled 2022-02-04: qty 1

## 2022-02-04 NOTE — Assessment & Plan Note (Signed)
Platelets 54, recent baseline 40s to 60s.  Likely due to liver cirrhosis. -SCDs for DVT prophylaxis

## 2022-02-04 NOTE — Assessment & Plan Note (Signed)
Fever of 100.7.  With abdominal discomfort.  History of liver cirrhosis.  Abdomen distended but non-tender, guarding or rebound and not tense.  No leukocytosis.  Heart rate ranging from 77-105.  Rules in for sepsis.  Also appears volume overloaded.  He denies urinary symptoms, but UA showing positive nitrites.  COVID test negative. -CT abdomen and pelvis without contrast, shows slight interval increase in size of moderate volume ascites -Continue IV ceftriaxone 2 g daily -IR paracentesis in a.m. with fluid analysis -Add on urine cultures -Follow-up blood cultures

## 2022-02-04 NOTE — ED Notes (Signed)
Hospitalist at bedside 

## 2022-02-04 NOTE — Assessment & Plan Note (Signed)
Controlled.  A1c 6.4. -Hold linagliptin - SSI- S

## 2022-02-04 NOTE — Assessment & Plan Note (Signed)
EKG without changes, no chest pain.

## 2022-02-04 NOTE — Assessment & Plan Note (Signed)
Stable. -Resume Lasix and spironolactone

## 2022-02-04 NOTE — ED Provider Notes (Signed)
[facility] EMERGENCY DEPARTMENT Provider Note   CSN: [id number] Arrival date & time: 02/04/22  1140     History  Chief Complaint  Patient presents with   Fever    Edward [name] is a 71 y.o. male.  HPI   Patient with medical history including anemia, copper deficiency, followed hematology, CKD, CLD, CAD, diabetes, hypertension, cirrhosis with esophagus varices presents with complaints of not feeling well.  Patient states that he has not been feeling welling for the last 2 days, states he just feels generalized weakness, states he has slight cough which is nonproductive, he denies any fevers chills general body aches, denies any stomach pains nausea vomiting diarrhea, states that he recently had an endoscopy performed and studies felt bad ever since he denies any chest pain or shortness of breath denies any hemoptysis, hematemesis coffee-ground emesis denies any melena or hematochezia.  I reviewed patient's chart he is followed by GI, he had an EGD done which reveals large  Home Medications Prior to Admission medications   Medication Sig Start Date End Date Taking? Authorizing Provider  albuterol (VENTOLIN HFA) 108 (90 Base) MCG/ACT inhaler Inhale 1-2 puffs into the lungs every 6 (six) hours as needed for wheezing or shortness of breath. 01/23/22   Triplett, Tammy, PA-C  brimonidine (ALPHAGAN) 0.2 % ophthalmic solution Place 1 drop into both eyes 2 (two) times daily. 01/09/22   [provider]  ciprofloxacin (CIPRO) 500 MG tablet Take 1 tablet (500 mg total) by mouth daily with breakfast. Patient not taking: Reported on 01/23/2022 12/11/21   Murlean Iba, MD  furosemide (LASIX) 40 MG tablet Take 1 tablet (40 mg total) by mouth daily. 12/12/21 12/12/22  Johnson, Clanford L, MD  lactulose (CHRONULAC) 10 GM/15ML solution Take 15 mLs (10 g total) by mouth 2 (two) times daily. 12/11/21   Johnson, Clanford L, MD  linagliptin (TRADJENTA) 5 MG TABS tablet Take 1 tablet (5 mg total) by  mouth daily. 12/11/21   Johnson, Clanford L, MD  midodrine (PROAMATINE) 10 MG tablet Take 2 tablets (20 mg total) by mouth 3 (three) times daily with meals. 01/12/22   Hassell Done, Mary-Margaret, FNP  pantoprazole (PROTONIX) 40 MG tablet Take 1 tablet (40 mg total) by mouth 2 (two) times daily. 02/01/22 02/01/23  Eloise Harman, DO  potassium chloride (KLOR-CON M) 10 MEQ tablet Take 1 tablet (10 mEq total) by mouth daily. 12/12/21   Johnson, Clanford L, MD  sodium bicarbonate 650 MG tablet Take 1 tablet (650 mg total) by mouth 3 (three) times daily. 11/22/21   Barton Dubois, MD  spironolactone (ALDACTONE) 25 MG tablet Take 0.5 tablets (12.5 mg total) by mouth daily. 12/12/21   Johnson, Clanford L, MD  tamsulosin (FLOMAX) 0.4 MG CAPS capsule Take 1 capsule (0.4 mg total) by mouth daily after supper. 12/11/21   Johnson, Clanford L, MD  traZODone (DESYREL) 50 MG tablet Take 1 tablet (50 mg total) by mouth at bedtime. 12/11/21   Murlean Iba, MD      Allergies    Patient has no known allergies.    Review of Systems   Review of Systems  Constitutional:  Positive for fatigue. Negative for chills and fever.  Respiratory:  Negative for shortness of breath.   Cardiovascular:  Negative for chest pain.  Gastrointestinal:  Negative for abdominal pain.  Neurological:  Negative for headaches.    Physical Exam Updated Vital Signs BP 123/66 (BP Location: Right Arm)   Pulse 77   Temp [name])  100.7 F (38.2 C) (Rectal)   Resp 17   Ht 5' 10"  (1.778 m)   Wt 71.7 kg   SpO2 99%   BMI 22.67 kg/m  Physical Exam Vitals and nursing note reviewed.  Constitutional:      General: He is not in acute distress.    Appearance: He is not ill-appearing.  HENT:     Head: Normocephalic and atraumatic.     Nose: No congestion.  Eyes:     Conjunctiva/sclera: Conjunctivae normal.  Cardiovascular:     Rate and Rhythm: Normal rate and regular rhythm.     Pulses: Normal pulses.     Heart sounds: No murmur heard.    No  friction rub. No gallop.  Pulmonary:     Effort: No respiratory distress.     Breath sounds: No wheezing, rhonchi or rales.  Abdominal:     General: There is distension.     Palpations: Abdomen is soft.     Tenderness: There is no abdominal tenderness. There is no right CVA tenderness or left CVA tenderness.     Comments: Abdomen distended, soft, nontender, no guarding rebound or peritoneal sign.  Skin:    General: Skin is warm and dry.  Neurological:     Mental Status: He is alert.     GCS: GCS eye subscore is 4. GCS verbal subscore is 5. GCS motor subscore is 6.     Motor: Motor function is intact. No weakness.     Coordination: Romberg sign negative. Finger-Nose-Finger Test and Heel to [place] Test normal.     Comments: Cranial nerves II through XII gross intact no difficulty with word finding following two-step commands no unilateral weakness.  Psychiatric:        Mood and Affect: Mood normal.     ED Results / Procedures / Treatments   Labs (all labs ordered are listed, but only abnormal results are displayed) Labs Reviewed  CBC WITH DIFFERENTIAL/PLATELET - Abnormal; Notable for the following components:      Result Value   RBC 3.16 (*)    Hemoglobin 11.2 (*)    HCT 32.0 (*)    MCV 101.3 (*)    MCH 35.4 (*)    Platelets 54 (*)    Lymphs Abs 0.4 (*)    All other components within normal limits  HEPATIC FUNCTION PANEL - Abnormal; Notable for the following components:   Total Protein 6.4 (*)    Albumin 3.0 (*)    AST 56 (*)    Total Bilirubin 5.0 (*)    Bilirubin, Direct 1.4 (*)    Indirect Bilirubin 3.6 (*)    All other components within normal limits  BASIC METABOLIC PANEL - Abnormal; Notable for the following components:   Sodium 131 (*)    Chloride 96 (*)    Glucose, Bld 198 (*)    BUN 28 (*)    Creatinine, Ser 2.04 (*)    Calcium 8.7 (*)    GFR, Estimated 35 (*)    All other components within normal limits  PROTIME-INR - Abnormal; Notable for the following  components:   Prothrombin Time 19.6 (*)    INR 1.7 (*)    All other components within normal limits  CBG MONITORING, ED - Abnormal; Notable for the following components:   Glucose-Capillary 199 (*)    All other components within normal limits  RESP PANEL BY RT-PCR (FLU A&B, COVID) ARPGX2  CULTURE, BLOOD (ROUTINE X 2)  CULTURE, BLOOD (ROUTINE X 2)  LIPASE, BLOOD  URINALYSIS, ROUTINE W REFLEX MICROSCOPIC    EKG None  Radiology CT ABDOMEN PELVIS WO CONTRAST  Result Date: 02/04/2022 CLINICAL DATA:  Bowel obstruction suspected EXAM: CT ABDOMEN AND PELVIS WITHOUT CONTRAST TECHNIQUE: Multidetector CT imaging of the abdomen and pelvis was performed following the standard protocol without IV contrast. RADIATION DOSE REDUCTION: This exam was performed according to the departmental dose-optimization program which includes automated exposure control, adjustment of the mA and/or kV according to patient size and/or use of iterative reconstruction technique. COMPARISON:  CT abdomen pelvis 11/24/2021 FINDINGS: Lower chest: Trace bilateral pleural effusions. Thickening of the visualized distal esophagus likely due to paraseptal varices. Four-vessel coronary artery calcification. Hepatobiliary: Nodular hepatic contour. Enlarged caudate lobe. No focal liver abnormality. Calcified gallstones noted within the gallbladder lumen. No gallbladder wall thickening or pericholecystic fluid. No biliary dilatation. Pancreas: No focal lesion. Normal pancreatic contour. No surrounding inflammatory changes. No main pancreatic ductal dilatation. Spleen: The spleen is enlarged in size measuring up to 14 cm. No focal splenic lesion. Adrenals/Urinary Tract: No adrenal nodule bilaterally. No nephrolithiasis and no hydronephrosis. No definite contour-deforming renal mass. No ureterolithiasis or hydroureter. The urinary bladder is unremarkable. Stomach/Bowel: Stomach is within normal limits. No evidence of bowel wall thickening or  dilatation. Portal colopathy of the ascending colon. Sigmoid diverticulosis. Appendix appears normal. Vascular/Lymphatic: No abdominal aorta or iliac aneurysm. Mild to moderate atherosclerotic plaque of the aorta and its branches. No abdominal, pelvic, or inguinal lymphadenopathy. Reproductive: Prostate is unremarkable. Other: Interval slight increase in moderate volume free intraperitoneal fluid. Esophageal and paraesophageal venous collaterals. No intraperitoneal free gas. No organized fluid collection. Musculoskeletal: No abdominal wall hernia or abnormality. No suspicious lytic or blastic osseous lesions. No acute displaced fracture. Multilevel degenerative changes of the spine. IMPRESSION: 1. Trace bilateral pleural effusions. 2. Slight interval increase in size of moderate volume ascites. 3. Cirrhosis with portal hypertension. No focal liver lesions identified. Please note that liver protocol enhanced MR and CT are the most sensitive tests for the screening detection of hepatocellular carcinoma in the high risk setting of cirrhosis. 4. Thickening of the visualized distal esophagus likely due to esophageal and paraesophageal varices. Markedly limited evaluation due to noncontrast study. 5. Portal colopathy of the ascending colon. 6. Cholelithiasis with no CT finding of acute cholecystitis. 7. Colonic diverticulosis with no acute diverticulitis. 8. Aortic Atherosclerosis (ICD10-I70.0) including four-vessel coronary calcification. Electronically Signed   By: Iven Finn M.D.   On: 02/04/2022 16:10   DG Abdomen Acute W/Chest  Result Date: 02/04/2022 CLINICAL DATA:  Cough. Stomach distention. Fever, chills, and fatigue. Decreased urinary output. EXAM: DG ABDOMEN ACUTE WITH 1 VIEW CHEST COMPARISON:  Chest radiographs 01/23/2022 and 12/08/2021. CT abdomen and pelvis 11/24/2021 FINDINGS: Cardiac silhouette and mediastinal contours are within normal limits. Moderately decreased lung volumes, mildly worsened  from 01/23/2022 most recent prior chest radiograph. Right-greater-than-left horizontal linear basilar likely subsegmental atelectasis is mildly increased from 01/22/2022. No pleural effusion or pneumothorax. Moderate stool within the ascending and transverse colon. Air is seen within loops of small bowel as well as the ascending, transverse, descending colon, and rectum. On supine view, there is a single loop of vertically oriented bowel overlying the lateral left hemiabdomen that is mildly distended, measuring up to 3.5 cm. Mild-to-moderate multilevel degenerative disc changes of the visualized thoracic and lumbar spine. Mild levocurvature of the upper lumbar spine. No subdiaphragmatic free air on upright view. IMPRESSION: 1. Right-greater-than-left horizontal linear basilar likely subsegmental atelectasis. 2. Moderate stool within the ascending  and transverse colon. 3. On supine view, there is a single loop of vertically oriented bowel overlying the lateral left hemiabdomen that is mildly distended. This may represent a focal ileus. Air is seen within the more distal colon. Electronically Signed   By: Yvonne Kendall M.D.   On: 02/04/2022 15:02    Procedures Procedures    Medications Ordered in ED Medications  cefTRIAXone (ROCEPHIN) 1 g in sodium chloride 0.9 % 100 mL IVPB (has no administration in time range)    ED Course/ Medical Decision Making/ A&P                           Medical Decision Making Amount and/or Complexity of Data Reviewed Labs: ordered. Radiology: ordered.  Risk Decision regarding hospitalization.   This patient presents to the ED for concern of fatigue, this involves an extensive number of treatment options, and is a complaint that carries with it a high risk of complications and morbidity.  The differential diagnosis includes sepsis, SBP, pneumonia, bowel obstruction, CVA    Additional history obtained:  Additional history obtained from family External records  from outside source obtained and reviewed including GI notes   Co morbidities that complicate the patient evaluation  Cirrhosis, esophagus varices, anemia  Social Determinants of Health:  N/A    Lab Tests:  I Ordered, and personally interpreted labs.  The pertinent results include: CBC shows macrocytic anemia with hemoglobin of 11.2, BMP shows sodium 131 glucose 198 BUN 28 creatinine 2, GFR 35, hepatic panel elevated AST T. bili 5, direct 1.4 indirect 3.6  Imaging Studies ordered:  I ordered imaging studies including acute chest abdomen I independently visualized and interpreted imaging which showed acute chest reveals moderate amount of stool burden, overall some distended large bowel concern for possible ileus, CT abdomen pelvis reveals increased ascites without any acute abnormalities I agree with the radiologist interpretation   Cardiac Monitoring:  The patient was maintained on a cardiac monitor.  I personally viewed and interpreted the cardiac monitored which showed an underlying rhythm of: EKG without signs of ischemia   Medicines ordered and prescription drug management:  I ordered medication including ceftriaxone I have reviewed the patients home medicines and have made adjustments as needed  Critical Interventions:  Benign   Reevaluation:  Presents with feeling fatigued, patient slightly tachycardic on my exam and warm to the touch repeat vital signs reveals a temp of 100.9, will obtain lab work and reassess.  Chest reveals possible ileus, patient also has an elevated T. bili, will continue to assess with CT abdomen pelvis.  CT imaging was negative for acute findings, due to his worsening stomach dilation and fever I am concerned for possible SBP, will start on antibiotics and consult with GI for further recommendations    Consultations Obtained:  I requested consultation with the GI Dr. Abbey Chatters,  and discussed lab and imaging findings as well as pertinent  plan - they recommend: With current plan admit to medicine he will be available for consultation the morning Spoke with Dr Denton Brick who will admit the patient     Test Considered:  N/A    Rule out low suspicion for lower lobe pneumonia as lung sounds are clear bilaterally, chest x-ray is unremarkable.  Low suspicion for acute bili abnormality as he has no right upper quadrant tenderness, no elevation in alk phos or liver enzymes.  He does have a slight elevation in his T. bili but suspect  this is from cirrhosis.  I doubt cholangitis or choledocholithiasis is no common duct dilation.  Low suspicion for pancreatitis as lipase is within normal limits.  Low suspicion for ruptured stomach ulcer as she has no peritoneal sign present on exam.  Suspicion for bowel obstruction, volvulus, complicated diverticulitis, abdominal abscess as CT imaging is all negative these findings.  Will defer on paracentesis at this time due to his low platelet counts increased risk for internal bleed.   Dispostion and problem list  After consideration of the diagnostic results and the patients response to treatment, I feel that the patent would benefit from admission.  Febrile illness-I am concerned for possible SBP, will start her on ceftriaxone, patient will need IR paracentesis for further evaluation and will need for consultation by GI            Final Clinical Impression(s) / ED Diagnoses Final diagnoses:  Febrile illness    Rx / DC Orders ED Discharge Orders     None         Marcello Fennel, PA-C 02/04/22 1842    Elgie Congo, MD 02/04/22 1955

## 2022-02-04 NOTE — H&P (Signed)
History and Physical    [name] [facility] OIB:[id number] DOB: 23-Apr-1951 DOA: 02/04/2022  PCP: [name], FNP   Patient coming from: Home  I have personally briefly reviewed patient's old medical records in [name]  Chief Complaint: Fever  HPI: [name] is a 71 y.o. male with medical history significant for liver cirrhosis, diabetes mellitus, CKD 3, coronary artery disease, hypertension. Patient presented to the ED with complaints of fever at home, chills, abdominal distention and decreased urine output.  He reports a fever of up to 100 at home.  Abdominal distention has been gradual, he reports a generalized abdominal "soreness".  He denies pain with urination.  Denies cough, no difficulty breathing.  No chest pain.  No vomiting, no change in bowel habits.  He reports chronic swelling to his bilateral lower extremity that is unchanged.  ED Course: Tmax of 100.7.  Heart rate 77-105.  Respirate rate 14-26.  Blood pressure systolic 945W.  O2 sats greater than 93% on room air. WBC 4.4.  Liver enzymes elevated but about baseline.  UA with positive nitrite.  Platelets 54. Chest and abdominal x-ray obtained, shows right greater than left horizontal linear basilar likely subsegmental atelectasis. Abdominal and pelvic CT without contrast-trace bilateral pleural effusions, slight increased size of moderate volume ascites.  No acute findings. IV ceftriaxone started.   Paracentesis was not attempted in ED due to patient's baseline thrombocytopenia. Plan was for IR paracentesis in a.m.  EDP talked with Dr. Abbey Chatters, recommended admission, will see in consult if needed. Hospitalist to admit for fever possibly from SBP.   Review of Systems: As per HPI all other systems reviewed and negative.  Past Medical History:  Diagnosis Date   Cataract    CKD (chronic kidney disease) stage 3, GFR 30-59 ml/min (HCC)    Coronary atherosclerosis of native coronary artery    BMS LAD 2006;  residual 75% distal RCA; EF 50%   Essential hypertension    Glaucoma    Low back pain    MI (myocardial infarction) ([place])    Anterior 2006   Mixed hyperlipidemia    Renal insufficiency    Type 2 diabetes mellitus ([place])     Past Surgical History:  Procedure Laterality Date   CATARACT EXTRACTION, BILATERAL     CORONARY ANGIOPLASTY WITH STENT PLACEMENT  2006     reports that he quit smoking about 34 years ago. His smoking use included cigarettes. He has a 40.00 pack-year smoking history. He has been exposed to tobacco smoke. He has never used smokeless tobacco. He reports that he does not drink alcohol and does not use drugs.  No Known Allergies  Family History  Problem Relation Age of Onset   Aneurysm Father        Brain aneurysm   Lung cancer Mother        Small cell carcinoma of lung,kidney and heart   Heart disease Mother    Cancer Brother        lungs   Diabetes Sister    Diabetes Sister    Diabetes Brother    Diabetes Brother      Prior to Admission medications   Medication Sig Start Date End Date Taking? Authorizing Provider  albuterol (VENTOLIN HFA) 108 (90 Base) MCG/ACT inhaler Inhale 1-2 puffs into the lungs every 6 (six) hours as needed for wheezing or shortness of breath. 01/23/22   Triplett, Tammy, PA-C  brimonidine (ALPHAGAN) 0.2 % ophthalmic solution Place 1 drop into both eyes  2 (two) times daily. 01/09/22   [provider]  ciprofloxacin (CIPRO) 500 MG tablet Take 1 tablet (500 mg total) by mouth daily with breakfast. Patient not taking: Reported on 01/23/2022 12/11/21   Murlean Iba, MD  furosemide (LASIX) 40 MG tablet Take 1 tablet (40 mg total) by mouth daily. 12/12/21 12/12/22  Johnson, Clanford L, MD  lactulose (CHRONULAC) 10 GM/15ML solution Take 15 mLs (10 g total) by mouth 2 (two) times daily. 12/11/21   Johnson, Clanford L, MD  linagliptin (TRADJENTA) 5 MG TABS tablet Take 1 tablet (5 mg total) by mouth daily. 12/11/21   Johnson, Clanford L, MD   midodrine (PROAMATINE) 10 MG tablet Take 2 tablets (20 mg total) by mouth 3 (three) times daily with meals. 01/12/22   Hassell Done, Mary-Margaret, FNP  pantoprazole (PROTONIX) 40 MG tablet Take 1 tablet (40 mg total) by mouth 2 (two) times daily. 02/01/22 02/01/23  Eloise Harman, DO  potassium chloride (KLOR-CON M) 10 MEQ tablet Take 1 tablet (10 mEq total) by mouth daily. 12/12/21   Johnson, Clanford L, MD  sodium bicarbonate 650 MG tablet Take 1 tablet (650 mg total) by mouth 3 (three) times daily. 11/22/21   Barton Dubois, MD  spironolactone (ALDACTONE) 25 MG tablet Take 0.5 tablets (12.5 mg total) by mouth daily. 12/12/21   Johnson, Clanford L, MD  tamsulosin (FLOMAX) 0.4 MG CAPS capsule Take 1 capsule (0.4 mg total) by mouth daily after supper. 12/11/21   Johnson, Clanford L, MD  traZODone (DESYREL) 50 MG tablet Take 1 tablet (50 mg total) by mouth at bedtime. 12/11/21   Murlean Iba, MD    Physical Exam: Vitals:   02/04/22 1209 02/04/22 1300 02/04/22 1745 02/04/22 1800  BP: 123/66   (!) 125/56  Pulse: (!) 105 77 93 93  Resp:  17 (!) 26 14  Temp: 99.7 F (37.6 C) (!) 100.7 F (38.2 C)    TempSrc: Oral Rectal    SpO2: 93% 99% 96% 97%  Weight:      Height:        Constitutional: NAD, calm, comfortable Vitals:   02/04/22 1209 02/04/22 1300 02/04/22 1745 02/04/22 1800  BP: 123/66   (!) 125/56  Pulse: (!) 105 77 93 93  Resp:  17 (!) 26 14  Temp: 99.7 F (37.6 C) (!) 100.7 F (38.2 C)    TempSrc: Oral Rectal    SpO2: 93% 99% 96% 97%  Weight:      Height:       Eyes: PERRL, lids and conjunctivae normal ENMT: Mucous membranes are moist.   Neck: normal, supple, no masses, no thyromegaly Respiratory: clear to auscultation bilaterally, no wheezing, no crackles.  Cardiovascular: Regular rate and rhythm, 3/6 known systolic murmurs / no rubs / gallops.  2+ pitting lower extremity edema bilaterally, chronic and unchanged.  Extremities warm..  Abdomen: no tenderness, distended but not  tense, no masses palpated. No hepatosplenomegaly. Bowel sounds positive.  Musculoskeletal: no clubbing / cyanosis. No joint deformity upper and lower extremities.  Skin: no rashes, lesions, ulcers. No induration Neurologic: No apparent cranial nerve abnormality, moving extremities spontaneously.  Psychiatric: Normal judgment and insight. Alert and oriented x 3. Normal mood.   Labs on Admission: I have personally reviewed following labs and imaging studies  CBC: Recent Labs  Lab 02/04/22 1235  WBC 4.4  NEUTROABS 3.4  HGB 11.2*  HCT 32.0*  MCV 101.3*  PLT 54*   Basic Metabolic Panel: Recent Labs  Lab 02/04/22 1257  NA 131*  K 3.9  CL 96*  CO2 24  GLUCOSE 198*  BUN 28*  CREATININE 2.04*  CALCIUM 8.7*   GFR: Estimated Creatinine Clearance: 34.7 mL/min (A) (by C-G formula based on SCr of 2.04 mg/dL (H)). Liver Function Tests: Recent Labs  Lab 02/04/22 1235  AST 56*  ALT 43  ALKPHOS 73  BILITOT 5.0*  PROT 6.4*  ALBUMIN 3.0*   Recent Labs  Lab 02/04/22 1235  LIPASE 50   No results for input(s): "AMMONIA" in the last 168 hours. Coagulation Profile: Recent Labs  Lab 02/04/22 1444  INR 1.7*    CBG: Recent Labs  Lab 02/01/22 0705 02/04/22 1207  GLUCAP 162* 199*   Radiological Exams on Admission: CT ABDOMEN PELVIS WO CONTRAST  Result Date: 02/04/2022 CLINICAL DATA:  Bowel obstruction suspected EXAM: CT ABDOMEN AND PELVIS WITHOUT CONTRAST TECHNIQUE: Multidetector CT imaging of the abdomen and pelvis was performed following the standard protocol without IV contrast. RADIATION DOSE REDUCTION: This exam was performed according to the departmental dose-optimization program which includes automated exposure control, adjustment of the mA and/or kV according to patient size and/or use of iterative reconstruction technique. COMPARISON:  CT abdomen pelvis 11/24/2021 FINDINGS: Lower chest: Trace bilateral pleural effusions. Thickening of the visualized distal esophagus  likely due to paraseptal varices. Four-vessel coronary artery calcification. Hepatobiliary: Nodular hepatic contour. Enlarged caudate lobe. No focal liver abnormality. Calcified gallstones noted within the gallbladder lumen. No gallbladder wall thickening or pericholecystic fluid. No biliary dilatation. Pancreas: No focal lesion. Normal pancreatic contour. No surrounding inflammatory changes. No main pancreatic ductal dilatation. Spleen: The spleen is enlarged in size measuring up to 14 cm. No focal splenic lesion. Adrenals/Urinary Tract: No adrenal nodule bilaterally. No nephrolithiasis and no hydronephrosis. No definite contour-deforming renal mass. No ureterolithiasis or hydroureter. The urinary bladder is unremarkable. Stomach/Bowel: Stomach is within normal limits. No evidence of bowel wall thickening or dilatation. Portal colopathy of the ascending colon. Sigmoid diverticulosis. Appendix appears normal. Vascular/Lymphatic: No abdominal aorta or iliac aneurysm. Mild to moderate atherosclerotic plaque of the aorta and its branches. No abdominal, pelvic, or inguinal lymphadenopathy. Reproductive: Prostate is unremarkable. Other: Interval slight increase in moderate volume free intraperitoneal fluid. Esophageal and paraesophageal venous collaterals. No intraperitoneal free gas. No organized fluid collection. Musculoskeletal: No abdominal wall hernia or abnormality. No suspicious lytic or blastic osseous lesions. No acute displaced fracture. Multilevel degenerative changes of the spine. IMPRESSION: 1. Trace bilateral pleural effusions. 2. Slight interval increase in size of moderate volume ascites. 3. Cirrhosis with portal hypertension. No focal liver lesions identified. Please note that liver protocol enhanced MR and CT are the most sensitive tests for the screening detection of hepatocellular carcinoma in the high risk setting of cirrhosis. 4. Thickening of the visualized distal esophagus likely due to esophageal  and paraesophageal varices. Markedly limited evaluation due to noncontrast study. 5. Portal colopathy of the ascending colon. 6. Cholelithiasis with no CT finding of acute cholecystitis. 7. Colonic diverticulosis with no acute diverticulitis. 8. Aortic Atherosclerosis (ICD10-I70.0) including four-vessel coronary calcification. Electronically Signed   By: Iven Finn M.D.   On: 02/04/2022 16:10   DG Abdomen Acute W/Chest  Result Date: 02/04/2022 CLINICAL DATA:  Cough. Stomach distention. Fever, chills, and fatigue. Decreased urinary output. EXAM: DG ABDOMEN ACUTE WITH 1 VIEW CHEST COMPARISON:  Chest radiographs 01/23/2022 and 12/08/2021. CT abdomen and pelvis 11/24/2021 FINDINGS: Cardiac silhouette and mediastinal contours are within normal limits. Moderately decreased lung volumes, mildly worsened from 01/23/2022 most recent prior chest radiograph.  Right-greater-than-left horizontal linear basilar likely subsegmental atelectasis is mildly increased from 01/22/2022. No pleural effusion or pneumothorax. Moderate stool within the ascending and transverse colon. Air is seen within loops of small bowel as well as the ascending, transverse, descending colon, and rectum. On supine view, there is a single loop of vertically oriented bowel overlying the lateral left hemiabdomen that is mildly distended, measuring up to 3.5 cm. Mild-to-moderate multilevel degenerative disc changes of the visualized thoracic and lumbar spine. Mild levocurvature of the upper lumbar spine. No subdiaphragmatic free air on upright view. IMPRESSION: 1. Right-greater-than-left horizontal linear basilar likely subsegmental atelectasis. 2. Moderate stool within the ascending and transverse colon. 3. On supine view, there is a single loop of vertically oriented bowel overlying the lateral left hemiabdomen that is mildly distended. This may represent a focal ileus. Air is seen within the more distal colon. Electronically Signed   By: Yvonne Kendall M.D.   On: 02/04/2022 15:02    EKG: Independently reviewed.  Sinus rhythm, rate 92, QTc 502.  No ST or T wave changes from prior.  Assessment/Plan Principal Problem:   Fever Active Problems:   Decompensated hepatic cirrhosis (HCC)   CKD (chronic kidney disease) stage 3B, GFR 30-59 ml/min (HCC)   Thrombocytopenia (HCC)   Coronary atherosclerosis of native coronary artery   Essential hypertension   Type 2 diabetes with nephropathy (HCC)  Assessment and Plan: * Fever Fever of 100.7.  With abdominal discomfort.  History of liver cirrhosis.  Abdomen distended but non-tender, guarding or rebound and not tense.  No leukocytosis.  Heart rate ranging from 77-105.  Rules in for sepsis.  Also appears volume overloaded.  He denies urinary symptoms, but UA showing positive nitrites.  COVID test negative. -CT abdomen and pelvis without contrast, shows slight interval increase in size of moderate volume ascites -Continue IV ceftriaxone 2 g daily -IR paracentesis in a.m. with fluid analysis -Add on urine cultures -Follow-up blood cultures  Decompensated hepatic cirrhosis (HCC) Presenting with ascites.  Also with chronic thrombocytopenia, and lower extremity swelling.  Last paracentesis 01/23/2022, drained 4.9 L of peritoneal fluid -Resume lactulose -Paracentesis in a.m. -Resume Protonix, spironolactone, midodrine - Lasix 40 mg daily  CKD (chronic kidney disease) stage 3B, GFR 30-59 ml/min (HCC) Creatinine 2.04.  At baseline.  Thrombocytopenia ([place]) Platelets 54, recent baseline 40s to 60s.  Likely due to liver cirrhosis. -SCDs for DVT prophylaxis  Type 2 diabetes with nephropathy ([place]) Controlled.  A1c 6.4. -Hold linagliptin - SSI- S  Essential hypertension Stable. -Resume Lasix and spironolactone  Coronary atherosclerosis of native coronary artery EKG without changes, no chest pain.   DVT prophylaxis:  SCDS Code Status: Full Family Communication: None at  bedside Disposition Plan: ~ 2 days Consults called: None  Admission status: Inpt Tele  I certify that at the point of admission it is my clinical judgment that the patient will require inpatient hospital care spanning beyond 2 midnights from the point of admission due to high intensity of service, high risk for further deterioration and high frequency of surveillance required.    Bethena Roys MD [facility]  02/04/2022, 10:39 PM

## 2022-02-04 NOTE — Assessment & Plan Note (Signed)
Creatinine 2.04.  At baseline.

## 2022-02-04 NOTE — Assessment & Plan Note (Addendum)
Presenting with ascites.  Also with chronic thrombocytopenia, and lower extremity swelling.  Last paracentesis 01/23/2022, drained 4.9 L of peritoneal fluid -Resume lactulose -Paracentesis in a.m. -Resume Protonix, spironolactone, midodrine - Lasix 40 mg daily

## 2022-02-04 NOTE — ED Triage Notes (Addendum)
Here from phone with ex-wife for fever, chills, fatigue, abd distention, and decreased urinary output. Denies pain, sob, cough, NVD. "Just tired". CBG 199. Alert, NAD, calm, interactive. Mentions throat surgery, 1 week ago. Throat appears unremarkable.

## 2022-02-05 ENCOUNTER — Observation Stay (HOSPITAL_COMMUNITY): Payer: 59

## 2022-02-05 ENCOUNTER — Encounter (HOSPITAL_COMMUNITY): Payer: Self-pay | Admitting: Internal Medicine

## 2022-02-05 ENCOUNTER — Telehealth: Payer: Self-pay | Admitting: Nurse Practitioner

## 2022-02-05 ENCOUNTER — Ambulatory Visit: Payer: 59 | Admitting: Nurse Practitioner

## 2022-02-05 DIAGNOSIS — Z1152 Encounter for screening for COVID-19: Secondary | ICD-10-CM | POA: Diagnosis not present

## 2022-02-05 DIAGNOSIS — J9811 Atelectasis: Secondary | ICD-10-CM | POA: Diagnosis present

## 2022-02-05 DIAGNOSIS — N390 Urinary tract infection, site not specified: Secondary | ICD-10-CM | POA: Diagnosis present

## 2022-02-05 DIAGNOSIS — Z1611 Resistance to penicillins: Secondary | ICD-10-CM | POA: Diagnosis present

## 2022-02-05 DIAGNOSIS — E1165 Type 2 diabetes mellitus with hyperglycemia: Secondary | ICD-10-CM | POA: Diagnosis not present

## 2022-02-05 DIAGNOSIS — B9620 Unspecified Escherichia coli [E. coli] as the cause of diseases classified elsewhere: Secondary | ICD-10-CM | POA: Diagnosis present

## 2022-02-05 DIAGNOSIS — E871 Hypo-osmolality and hyponatremia: Secondary | ICD-10-CM | POA: Diagnosis present

## 2022-02-05 DIAGNOSIS — Z1629 Resistance to other single specified antibiotic: Secondary | ICD-10-CM | POA: Diagnosis present

## 2022-02-05 DIAGNOSIS — R791 Abnormal coagulation profile: Secondary | ICD-10-CM | POA: Diagnosis present

## 2022-02-05 DIAGNOSIS — K7290 Hepatic failure, unspecified without coma: Secondary | ICD-10-CM | POA: Diagnosis not present

## 2022-02-05 DIAGNOSIS — R509 Fever, unspecified: Secondary | ICD-10-CM | POA: Diagnosis present

## 2022-02-05 DIAGNOSIS — I129 Hypertensive chronic kidney disease with stage 1 through stage 4 chronic kidney disease, or unspecified chronic kidney disease: Secondary | ICD-10-CM | POA: Diagnosis present

## 2022-02-05 DIAGNOSIS — I9589 Other hypotension: Secondary | ICD-10-CM | POA: Diagnosis present

## 2022-02-05 DIAGNOSIS — E876 Hypokalemia: Secondary | ICD-10-CM | POA: Diagnosis present

## 2022-02-05 DIAGNOSIS — E8770 Fluid overload, unspecified: Secondary | ICD-10-CM | POA: Diagnosis present

## 2022-02-05 DIAGNOSIS — N1832 Chronic kidney disease, stage 3b: Secondary | ICD-10-CM | POA: Diagnosis present

## 2022-02-05 DIAGNOSIS — K7460 Unspecified cirrhosis of liver: Secondary | ICD-10-CM | POA: Diagnosis present

## 2022-02-05 DIAGNOSIS — A419 Sepsis, unspecified organism: Secondary | ICD-10-CM | POA: Diagnosis present

## 2022-02-05 DIAGNOSIS — E1122 Type 2 diabetes mellitus with diabetic chronic kidney disease: Secondary | ICD-10-CM | POA: Diagnosis present

## 2022-02-05 DIAGNOSIS — D75838 Other thrombocytosis: Secondary | ICD-10-CM | POA: Diagnosis present

## 2022-02-05 DIAGNOSIS — I8500 Esophageal varices without bleeding: Secondary | ICD-10-CM | POA: Diagnosis present

## 2022-02-05 DIAGNOSIS — I252 Old myocardial infarction: Secondary | ICD-10-CM | POA: Diagnosis not present

## 2022-02-05 DIAGNOSIS — F5101 Primary insomnia: Secondary | ICD-10-CM

## 2022-02-05 DIAGNOSIS — D696 Thrombocytopenia, unspecified: Secondary | ICD-10-CM | POA: Diagnosis present

## 2022-02-05 DIAGNOSIS — E782 Mixed hyperlipidemia: Secondary | ICD-10-CM | POA: Diagnosis present

## 2022-02-05 DIAGNOSIS — D631 Anemia in chronic kidney disease: Secondary | ICD-10-CM | POA: Diagnosis present

## 2022-02-05 DIAGNOSIS — R188 Other ascites: Secondary | ICD-10-CM | POA: Diagnosis present

## 2022-02-05 LAB — CBC
HCT: 30.4 % — ABNORMAL LOW (ref 39.0–52.0)
Hemoglobin: 10.5 g/dL — ABNORMAL LOW (ref 13.0–17.0)
MCH: 35.1 pg — ABNORMAL HIGH (ref 26.0–34.0)
MCHC: 34.5 g/dL (ref 30.0–36.0)
MCV: 101.7 fL — ABNORMAL HIGH (ref 80.0–100.0)
Platelets: 50 10*3/uL — ABNORMAL LOW (ref 150–400)
RBC: 2.99 MIL/uL — ABNORMAL LOW (ref 4.22–5.81)
RDW: 14.8 % (ref 11.5–15.5)
WBC: 3.9 10*3/uL — ABNORMAL LOW (ref 4.0–10.5)
nRBC: 0 % (ref 0.0–0.2)

## 2022-02-05 LAB — LACTATE DEHYDROGENASE, PLEURAL OR PERITONEAL FLUID: LD, Fluid: 38 U/L — ABNORMAL HIGH (ref 3–23)

## 2022-02-05 LAB — GLUCOSE, CAPILLARY
Glucose-Capillary: 153 mg/dL — ABNORMAL HIGH (ref 70–99)
Glucose-Capillary: 181 mg/dL — ABNORMAL HIGH (ref 70–99)
Glucose-Capillary: 170 mg/dL — ABNORMAL HIGH (ref 70–99)
Glucose-Capillary: 204 mg/dL — ABNORMAL HIGH (ref 70–99)

## 2022-02-05 LAB — GRAM STAIN

## 2022-02-05 LAB — BASIC METABOLIC PANEL
Anion gap: 9 (ref 5–15)
BUN: 29 mg/dL — ABNORMAL HIGH (ref 8–23)
CO2: 26 mmol/L (ref 22–32)
Calcium: 8.4 mg/dL — ABNORMAL LOW (ref 8.9–10.3)
Chloride: 98 mmol/L (ref 98–111)
Creatinine, Ser: 1.98 mg/dL — ABNORMAL HIGH (ref 0.61–1.24)
GFR, Estimated: 36 mL/min — ABNORMAL LOW (ref >60)
Glucose, Bld: 177 mg/dL — ABNORMAL HIGH (ref 70–99)
Potassium: 3.7 mmol/L (ref 3.5–5.1)
Sodium: 133 mmol/L — ABNORMAL LOW (ref 135–145)

## 2022-02-05 LAB — BODY FLUID CELL COUNT WITH DIFFERENTIAL
Eos, Fluid: 0 %
Lymphs, Fluid: 93 %
Monocyte-Macrophage-Serous Fluid: 4 % — ABNORMAL LOW (ref 50–90)
Neutrophil Count, Fluid: 3 % (ref 0–25)
Total Nucleated Cell Count, Fluid: 111 cu mm (ref 0–1000)

## 2022-02-05 LAB — GLUCOSE, PLEURAL OR PERITONEAL FLUID: Glucose, Fluid: 184 mg/dL

## 2022-02-05 LAB — PROTEIN, PLEURAL OR PERITONEAL FLUID: Total protein, fluid: <3 g/dL

## 2022-02-05 MED ORDER — POTASSIUM CHLORIDE CRYS ER 10 MEQ PO TBCR: 10.0000 meq | EXTENDED_RELEASE_TABLET | Freq: Every day | ORAL | 0 refills | Status: DC

## 2022-02-05 MED ORDER — TAMSULOSIN HCL 0.4 MG PO CAPS: 0.4000 mg | ORAL_CAPSULE | Freq: Every day | ORAL | 0 refills | Status: DC

## 2022-02-05 MED ORDER — FUROSEMIDE 40 MG PO TABS: 40.0000 mg | ORAL_TABLET | Freq: Every day | ORAL | 0 refills | Status: DC

## 2022-02-05 MED ORDER — TRAZODONE HCL 50 MG PO TABS: 50.0000 mg | ORAL_TABLET | Freq: Every day | ORAL | 1 refills | Status: AC

## 2022-02-05 NOTE — Procedures (Signed)
PreOperative Dx: Cirrhosis, ascites Postoperative Dx: Cirrhosis, ascites Procedure:   US guided paracentesis Radiologist:  Thornton Papas Anesthesia:  10 ml of1% lidocaine Specimen:  4.5 L of clear yellow ascitic fluid EBL:   < 1 ml Complications:  None

## 2022-02-05 NOTE — Hospital Course (Addendum)
71 y.o.m w/ history significant for liver cirrhosis, diabetes mellitus, CKD 3b creat~1.6-1.9,CAD, hypertension presented to the ED with fever chills abdominal distention decreased urine output, generalized abdominal discomfort.   In the ED was febrile BP stable, labs showed WBC 4.4, LFTS elevated but about baseline.  UA with positive nitrite.Platelets 54.Chest and abdominal x-ray -shows right greater than left horizontal linear basilar likely subsegmental atelectasis.Abdominal and pelvic CT without contrast-trace bilateral pleural effusions, slight increased size of moderate volume ascites.  Patient was admitted on IV ceftriaxone paracentesis was not attempted in the ED due to patient baseline thrombocytopenia, GI consulted and IR consulted and admitted due to concen for SBP. Underwent paracentesis 4.5 L removed, ascitic fluid studies with total nucleated cells 111, neutrophil 3 Gram stain no organism culture sent.  Patient continued on antibiotics last fever spike 10/2 8 AM

## 2022-02-05 NOTE — TOC Progression Note (Signed)
Transition of Care [name]) - Progression Note    Patient Details  Name: [name] MRN: [id number] Date of Birth: 1951/02/02  Transition of Care [name] CM/SW Contact  Salome Arnt, [place] Phone Number: 02/05/2022, 12:33 PM  Clinical Narrative:   Transition of Care [name]) Screening Note   Patient Details  Name: [name] Date of Birth: May 06, 1951   Transition of Care [name]) CM/SW Contact:    Salome Arnt, [place] Phone Number: 02/05/2022, 12:33 PM    Transition of Care Department [name]) has reviewed patient and no TOC needs have been identified at this time. We will continue to monitor patient advancement through interdisciplinary progression rounds. If new patient transition needs arise, please place a TOC consult.            Expected Discharge Plan and Services                                                 Social Determinants of Health (SDOH) Interventions    Readmission Risk Interventions    11/28/2021    4:08 PM 11/22/2021    2:53 PM  Readmission Risk Prevention Plan  Transportation Screening Complete Complete  PCP or Specialist Appt within 3-5 Days Not Complete   Home Care Screening  Patient refused  Medication Review (RN CM)  Complete  HRI or Home Care Consult Complete   Social Work Consult for [place] Planning/Counseling Complete   Palliative Care Screening Complete   Medication Review [employer]) Complete

## 2022-02-05 NOTE — Progress Notes (Signed)
PROGRESS NOTE Edward [name] [facility]  UDJ:[id number] DOB: January 07, 1951 DOA: 02/04/2022 PCP: [name], FNP   Brief Narrative/Hospital Course: 71 y.o.m w/ history significant for liver cirrhosis, diabetes mellitus, CKD 3b creat~1.6-1.9,CAD, hypertension presented to the ED with fever chills abdominal distention decreased urine output, generalized abdominal discomfort.   In the ED was febrile BP stable, labs showed WBC 4.4, LFTS elevated but about baseline.  UA with positive nitrite.Platelets 54.Chest and abdominal x-ray -shows right greater than left horizontal linear basilar likely subsegmental atelectasis.Abdominal and pelvic CT without contrast-trace bilateral pleural effusions, slight increased size of moderate volume ascites.  Patient was admitted on IV ceftriaxone paracentesis was not attempted in the ED due to patient baseline thrombocytopenia, GI consulted and IR consulted and admitted due to concen for SBP.    Subjective: Seen and examined this morning.  He is back from paracentesis mild abdominal soreness, complains of some cough while eating denies any fever chills dysuria abdominal pain nausea vomiting.  Assessment and Plan: Principal Problem:   Fever Active Problems:   Decompensated hepatic cirrhosis (HCC)   CKD (chronic kidney disease) stage 3B, GFR 30-59 ml/min (HCC)   Thrombocytopenia (HCC)   Coronary atherosclerosis of native coronary artery   Essential hypertension   Type 2 diabetes with nephropathy (HCC)   Fever Possible SBP Possible UTI: Infectious work-up unremarkable with imaging with chest x-ray, CT abdomen pelvis-no acute significant finding see report for details.  UA positive for nitrite.  Started on empiric ceftriaxone s/p IR paracentesis 4.5 L of clear yellow ascitic fluid removed-follow-up fluid analysis report.Follow-up urine culture.   Decompensated hepatic cirrhosis with ascites, thrombocytopenia, coagulopathy with INR 1.7 edema anasarca and  Transaminitis: On lactulose PPI Aldactone midodrine Lasix.  for paracentesis as above last was in 9/19 4.9 L removed.  Thrombocythemia in the setting of liver disease monitor-in 50s Recent Labs  Lab 02/04/22 1235 02/05/22 0543  PLT 54* 50*    CKD stage IIIb,creat~1.6-1.9, appears stable monitor Recent Labs  Lab 02/04/22 1235 02/05/22 0543  HGB 11.2* 10.5*  HCT 32.0* 30.4*    Type 2 diabetes mellitus A1c 6.4 holding linagliptin, continue SSI Recent Labs  Lab 02/01/22 0705 02/04/22 1207 02/04/22 2156 02/05/22 0730 02/05/22 1123  GLUCAP 162* 199* 223* 153* 181*    Essential hypertension BP stable continue diuretics as above CAD,no chest pain  Hyponatremia suspect in the setting of patient's anasarca. Anemia likely from chronic disease-chronic liver disease/CKD. Monitor  DVT prophylaxis: SCDs Start: 02/04/22 2149 Code Status:   Code Status: Full Code Family Communication: plan of care discussed with patient at bedside.  Patient status is: Observation changed to inpatient due to decompensates liver cirrhosis with ascites, needing paracentesis, needing IV antibiotics for fever  Level of care: Telemetry  Dispo: The patient is from: home            Anticipated disposition: home in 2 days  Mobility Assessment (last 72 hours)     Mobility Assessment     Row Name 02/05/22 0000 02/04/22 2330         Does patient have an order for bedrest or is patient medically unstable No - Continue assessment No - Continue assessment      What is the highest level of mobility based on the progressive mobility assessment? Level 3 (Stands with assist) - Balance while standing  and cannot march in place --      Is the above level different from baseline mobility prior to current illness? No - Consider discontinuing PT/OT --  Objective: Vitals last 24 hrs: Vitals:   02/05/22 0910 02/05/22 0920 02/05/22 0930 02/05/22 0940  BP: 124/71 124/66 120/68 121/60  Pulse: 93 93  90 89  Resp: '18 18 18 20  '$ Temp:      TempSrc:      SpO2: 97% 98% 96% 97%  Weight:      Height:       Weight change:   Physical Examination: General exam: alert awake,older than stated age, weak appearing. HEENT:Oral mucosa moist, Ear/Nose WNL grossly, dentition normal. Respiratory system: bilaterally diminished BS, no use of accessory muscle Cardiovascular system: S1 & S2 +, No JVD. Gastrointestinal system: Abdomen soft,NT,ND, BS+ Nervous System:Alert, awake, moving extremities and grossly nonfocal Extremities: LE edema +,distal peripheral pulses palpable.  Skin: No rashes,no icterus. MSK: Normal muscle bulk,tone, power  Medications reviewed:  Scheduled Meds:  furosemide  40 mg Oral Daily   insulin aspart  0-5 Units Subcutaneous QHS   insulin aspart  0-9 Units Subcutaneous TID WC   lactulose  10 g Oral BID   midodrine  20 mg Oral TID WC   pantoprazole  40 mg Oral BID   spironolactone  12.5 mg Oral Daily   tamsulosin  0.4 mg Oral QPC supper   Continuous Infusions:  cefTRIAXone (ROCEPHIN)  IV        Diet Order             Diet heart healthy/carb modified Room service appropriate? Yes; Fluid consistency: Thin  Diet effective now                  Intake/Output Summary (Last 24 hours) at 02/05/2022 1032 Last data filed at 02/05/2022 0600 Gross per 24 hour  Intake 100 ml  Output 300 ml  Net -200 ml   Net IO Since Admission: -200 mL [02/05/22 1032]  Wt Readings from Last 3 Encounters:  02/04/22 71.7 kg  01/23/22 72.5 kg  01/16/22 72.5 kg     Unresulted Labs (From admission, onward)     Start     Ordered   02/05/22 0902  Glucose, pleural or peritoneal fluid  Once,   R        02/05/22 0902   02/05/22 0902  Lactate dehydrogenase (pleural or peritoneal fluid)  Once,   R        02/05/22 0902   02/05/22 0902  PH, Body Fluid (NOT for Pleural or CSF)  Once,   R        02/05/22 0902   02/05/22 0902  Protein, pleural or peritoneal fluid  Once,   R        02/05/22  0902   02/05/22 0901  Aerobic/Anaerobic Culture w Gram Stain (surgical/deep wound)  Once,   R        02/05/22 0902   02/05/22 0901  Body fluid cell count with differential  Once,   R       Question:  Are there also cytology or pathology orders on this specimen?  Answer:  Yes   02/05/22 0902   02/05/22 0901  Body fluid culture w Gram Stain  Once,   R       Question:  Are there also cytology or pathology orders on this specimen?  Answer:  Yes   02/05/22 0902   02/04/22 2230  Urine Culture  (Urine Culture)  Add-on,   AD       Question:  Indication  Answer:  Sepsis   02/04/22 2229  Data Reviewed: I have personally reviewed following labs and imaging studies CBC: Recent Labs  Lab 02/04/22 1235 02/05/22 0543  WBC 4.4 3.9*  NEUTROABS 3.4  --   HGB 11.2* 10.5*  HCT 32.0* 30.4*  MCV 101.3* 101.7*  PLT 54* 50*   Basic Metabolic Panel: Recent Labs  Lab 02/04/22 1257 02/05/22 0543  NA 131* 133*  K 3.9 3.7  CL 96* 98  CO2 24 26  GLUCOSE 198* 177*  BUN 28* 29*  CREATININE 2.04* 1.98*  CALCIUM 8.7* 8.4*   GFR: Estimated Creatinine Clearance: 35.7 mL/min (A) (by C-G formula based on SCr of 1.98 mg/dL (H)). Liver Function Tests: Recent Labs  Lab 02/04/22 1235  AST 56*  ALT 43  ALKPHOS 73  BILITOT 5.0*  PROT 6.4*  ALBUMIN 3.0*   Recent Labs  Lab 02/04/22 1235  LIPASE 50   No results for input(s): "AMMONIA" in the last 168 hours. Coagulation Profile: Recent Labs  Lab 02/04/22 1444  INR 1.7*   BNP (last 3 results) No results for input(s): "PROBNP" in the last 8760 hours. HbA1C: No results for input(s): "HGBA1C" in the last 72 hours. CBG: Recent Labs  Lab 02/01/22 0705 02/04/22 1207 02/04/22 2156 02/05/22 0730  GLUCAP 162* 199* 223* 153*   Lipid Profile: No results for input(s): "CHOL", "HDL", "LDLCALC", "TRIG", "CHOLHDL", "LDLDIRECT" in the last 72 hours. Thyroid Function Tests: No results for input(s): "TSH", "T4TOTAL", "FREET4", "T3FREE",  "THYROIDAB" in the last 72 hours. Sepsis Labs: No results for input(s): "PROCALCITON", "LATICACIDVEN" in the last 168 hours.  Recent Results (from the past 240 hour(s))  Blood culture (routine x 2)     Status: None (Preliminary result)   Collection Time: 02/04/22  5:06 PM   Specimen: Site Not Specified; Blood  Result Value Ref Range Status   Specimen Description   Final    SITE NOT SPECIFIED BOTTLES DRAWN AEROBIC AND ANAEROBIC   Special Requests Blood Culture adequate volume  Final   Culture   Final    NO GROWTH < 12 HOURS Performed at [facility], [address] [place], [place] [address]    Report Status PENDING  Incomplete  Blood culture (routine x 2)     Status: None (Preliminary result)   Collection Time: 02/04/22  5:06 PM   Specimen: Site Not Specified; Blood  Result Value Ref Range Status   Specimen Description   Final    SITE NOT SPECIFIED BOTTLES DRAWN AEROBIC AND ANAEROBIC   Special Requests Blood Culture adequate volume  Final   Culture   Final    NO GROWTH < 12 HOURS Performed at [facility], [address] [place], [place] [address]    Report Status PENDING  Incomplete  Resp Panel by RT-PCR (Flu A&B, Covid) Anterior Nasal Swab     Status: None   Collection Time: 02/04/22  6:35 PM   Specimen: Anterior Nasal Swab  Result Value Ref Range Status   SARS Coronavirus 2 by RT PCR NEGATIVE NEGATIVE Final    Comment: (NOTE) SARS-CoV-2 target nucleic acids are NOT DETECTED.  The SARS-CoV-2 RNA is generally detectable in upper respiratory specimens during the acute phase of infection. The lowest concentration of SARS-CoV-2 viral copies this assay can detect is 138 copies/mL. A negative result does not preclude SARS-Cov-2 infection and should not be used as the sole basis for treatment or other patient management decisions. A negative result may occur with  improper specimen collection/handling, submission of specimen other than nasopharyngeal swab, presence  of viral  mutation(s) within the areas targeted by this assay, and inadequate number of viral copies(<138 copies/mL). A negative result must be combined with clinical observations, patient history, and epidemiological information. The expected result is Negative.  Fact Sheet for Patients:  EntrepreneurPulse.com.au  Fact Sheet for Healthcare Providers:  IncredibleEmployment.be  This test is no t yet approved or cleared by the [place] FDA and  has been authorized for detection and/or diagnosis of SARS-CoV-2 by FDA under an Emergency Use Authorization (EUA). This EUA will remain  in effect (meaning this test can be used) for the duration of the COVID-19 declaration under Section 564(b)(1) of the Act, 21 U.S.C.section 360bbb-3(b)(1), unless the authorization is terminated  or revoked sooner.       Influenza A by PCR NEGATIVE NEGATIVE Final   Influenza B by PCR NEGATIVE NEGATIVE Final    Comment: (NOTE) The Xpert Xpress SARS-CoV-2/FLU/RSV plus assay is intended as an aid in the diagnosis of influenza from Nasopharyngeal swab specimens and should not be used as a sole basis for treatment. Nasal washings and aspirates are unacceptable for Xpert Xpress SARS-CoV-2/FLU/RSV testing.  Fact Sheet for Patients: EntrepreneurPulse.com.au  Fact Sheet for Healthcare Providers: IncredibleEmployment.be  This test is not yet approved or cleared by the [place] FDA and has been authorized for detection and/or diagnosis of SARS-CoV-2 by FDA under an Emergency Use Authorization (EUA). This EUA will remain in effect (meaning this test can be used) for the duration of the COVID-19 declaration under Section 564(b)(1) of the Act, 21 U.S.C. section 360bbb-3(b)(1), unless the authorization is terminated or revoked.  Performed at [facility], [address] [place], [place] [address]     Antimicrobials: Anti-infectives (From  admission, onward)    Start     Dose/Rate Route Frequency Ordered Stop   02/05/22 1500  cefTRIAXone (ROCEPHIN) 2 g in sodium chloride 0.9 % 100 mL IVPB        2 g 200 mL/hr over 30 Minutes Intravenous Every 24 hours 02/04/22 2148     02/04/22 2115  cefTRIAXone (ROCEPHIN) 1 g in sodium chloride 0.9 % 100 mL IVPB       Note to Pharmacy: Total 2 g for intra-abd infection   1 g 200 mL/hr over 30 Minutes Intravenous  Once 02/04/22 2109 02/04/22 2253   02/04/22 1700  cefTRIAXone (ROCEPHIN) 1 g in sodium chloride 0.9 % 100 mL IVPB        1 g 200 mL/hr over 30 Minutes Intravenous  Once 02/04/22 1646 02/04/22 1820      Culture/Microbiology    Component Value Date/Time   SDES  02/04/2022 1706    SITE NOT SPECIFIED BOTTLES DRAWN AEROBIC AND ANAEROBIC   SDES  02/04/2022 1706    SITE NOT SPECIFIED BOTTLES DRAWN AEROBIC AND ANAEROBIC   SPECREQUEST Blood Culture adequate volume 02/04/2022 1706   SPECREQUEST Blood Culture adequate volume 02/04/2022 1706   CULT  02/04/2022 1706    NO GROWTH < 12 HOURS Performed at [facility], [address] [place], [place] [address]    [place]  02/04/2022 1706    NO GROWTH < 12 HOURS Performed at [facility], [address] [place], [place] [address]    REPTSTATUS PENDING 02/04/2022 1706   REPTSTATUS PENDING 02/04/2022 1706    Other culture-see note  Radiology Studies: US Paracentesis  Result Date: 02/05/2022 Lavonia Dana, MD     02/05/2022  9:54 AM PreOperative Dx: Cirrhosis, ascites Postoperative Dx: Cirrhosis, ascites Procedure:   US  guided paracentesis Radiologist:  Thornton Papas Anesthesia:  10 ml of1% lidocaine Specimen:  4.5 L of clear yellow ascitic fluid EBL:   < 1 ml Complications:  None    CT ABDOMEN PELVIS WO CONTRAST  Result Date: 02/04/2022 CLINICAL DATA:  Bowel obstruction suspected EXAM: CT ABDOMEN AND PELVIS WITHOUT CONTRAST TECHNIQUE: Multidetector CT imaging of the abdomen and pelvis was performed following the standard protocol without IV  contrast. RADIATION DOSE REDUCTION: This exam was performed according to the departmental dose-optimization program which includes automated exposure control, adjustment of the mA and/or kV according to patient size and/or use of iterative reconstruction technique. COMPARISON:  CT abdomen pelvis 11/24/2021 FINDINGS: Lower chest: Trace bilateral pleural effusions. Thickening of the visualized distal esophagus likely due to paraseptal varices. Four-vessel coronary artery calcification. Hepatobiliary: Nodular hepatic contour. Enlarged caudate lobe. No focal liver abnormality. Calcified gallstones noted within the gallbladder lumen. No gallbladder wall thickening or pericholecystic fluid. No biliary dilatation. Pancreas: No focal lesion. Normal pancreatic contour. No surrounding inflammatory changes. No main pancreatic ductal dilatation. Spleen: The spleen is enlarged in size measuring up to 14 cm. No focal splenic lesion. Adrenals/Urinary Tract: No adrenal nodule bilaterally. No nephrolithiasis and no hydronephrosis. No definite contour-deforming renal mass. No ureterolithiasis or hydroureter. The urinary bladder is unremarkable. Stomach/Bowel: Stomach is within normal limits. No evidence of bowel wall thickening or dilatation. Portal colopathy of the ascending colon. Sigmoid diverticulosis. Appendix appears normal. Vascular/Lymphatic: No abdominal aorta or iliac aneurysm. Mild to moderate atherosclerotic plaque of the aorta and its branches. No abdominal, pelvic, or inguinal lymphadenopathy. Reproductive: Prostate is unremarkable. Other: Interval slight increase in moderate volume free intraperitoneal fluid. Esophageal and paraesophageal venous collaterals. No intraperitoneal free gas. No organized fluid collection. Musculoskeletal: No abdominal wall hernia or abnormality. No suspicious lytic or blastic osseous lesions. No acute displaced fracture. Multilevel degenerative changes of the spine. IMPRESSION: 1. Trace  bilateral pleural effusions. 2. Slight interval increase in size of moderate volume ascites. 3. Cirrhosis with portal hypertension. No focal liver lesions identified. Please note that liver protocol enhanced MR and CT are the most sensitive tests for the screening detection of hepatocellular carcinoma in the high risk setting of cirrhosis. 4. Thickening of the visualized distal esophagus likely due to esophageal and paraesophageal varices. Markedly limited evaluation due to noncontrast study. 5. Portal colopathy of the ascending colon. 6. Cholelithiasis with no CT finding of acute cholecystitis. 7. Colonic diverticulosis with no acute diverticulitis. 8. Aortic Atherosclerosis (ICD10-I70.0) including four-vessel coronary calcification. Electronically Signed   By: Iven Finn M.D.   On: 02/04/2022 16:10   DG Abdomen Acute W/Chest  Result Date: 02/04/2022 CLINICAL DATA:  Cough. Stomach distention. Fever, chills, and fatigue. Decreased urinary output. EXAM: DG ABDOMEN ACUTE WITH 1 VIEW CHEST COMPARISON:  Chest radiographs 01/23/2022 and 12/08/2021. CT abdomen and pelvis 11/24/2021 FINDINGS: Cardiac silhouette and mediastinal contours are within normal limits. Moderately decreased lung volumes, mildly worsened from 01/23/2022 most recent prior chest radiograph. Right-greater-than-left horizontal linear basilar likely subsegmental atelectasis is mildly increased from 01/22/2022. No pleural effusion or pneumothorax. Moderate stool within the ascending and transverse colon. Air is seen within loops of small bowel as well as the ascending, transverse, descending colon, and rectum. On supine view, there is a single loop of vertically oriented bowel overlying the lateral left hemiabdomen that is mildly distended, measuring up to 3.5 cm. Mild-to-moderate multilevel degenerative disc changes of the visualized thoracic and lumbar spine. Mild levocurvature of the upper lumbar spine. No subdiaphragmatic free air on  upright  view. IMPRESSION: 1. Right-greater-than-left horizontal linear basilar likely subsegmental atelectasis. 2. Moderate stool within the ascending and transverse colon. 3. On supine view, there is a single loop of vertically oriented bowel overlying the lateral left hemiabdomen that is mildly distended. This may represent a focal ileus. Air is seen within the more distal colon. Electronically Signed   By: Yvonne Kendall M.D.   On: 02/04/2022 15:02     LOS: 0 days   Antonieta Pert, MD [facility]  02/05/2022, 10:32 AM

## 2022-02-05 NOTE — Telephone Encounter (Signed)
  Prescription Request  02/05/2022  Is this a "Controlled Substance" medicine? no  Have you seen your PCP in the last 2 weeks? Pt is in hospital and had to cancel appt today with mmm  If YES, route message to pool  -  If NO, patient needs to be scheduled for appointment.  What is the name of the medication or equipment? Flomax trazadon, tradjenta, potassium and furomemide  Have you contacted your pharmacy to request a refill? yes   Which pharmacy would you like this sent to? [facility]   Patient notified that their request is being sent to the clinical staff for review and that they should receive a response within 2 business days.

## 2022-02-05 NOTE — Progress Notes (Signed)
PT tolerated right sided paracentesis procedure well today and 4.5 Liters of clear yellow fluid removed with labs collected and sent for processing. PT transported via stretcher at this time back to inpatient bed assignment with no acute distress noted and verbalized understanding of post procedure instructions.

## 2022-02-06 DIAGNOSIS — R509 Fever, unspecified: Secondary | ICD-10-CM | POA: Diagnosis not present

## 2022-02-06 LAB — GLUCOSE, CAPILLARY
Glucose-Capillary: 166 mg/dL — ABNORMAL HIGH (ref 70–99)
Glucose-Capillary: 283 mg/dL — ABNORMAL HIGH (ref 70–99)
Glucose-Capillary: 262 mg/dL — ABNORMAL HIGH (ref 70–99)
Glucose-Capillary: 220 mg/dL — ABNORMAL HIGH (ref 70–99)

## 2022-02-06 LAB — COMPREHENSIVE METABOLIC PANEL
ALT: 43 U/L (ref 0–44)
AST: 60 U/L — ABNORMAL HIGH (ref 15–41)
Albumin: 2.7 g/dL — ABNORMAL LOW (ref 3.5–5.0)
Alkaline Phosphatase: 64 U/L (ref 38–126)
Anion gap: 10 (ref 5–15)
BUN: 27 mg/dL — ABNORMAL HIGH (ref 8–23)
CO2: 28 mmol/L (ref 22–32)
Calcium: 8.3 mg/dL — ABNORMAL LOW (ref 8.9–10.3)
Chloride: 94 mmol/L — ABNORMAL LOW (ref 98–111)
Creatinine, Ser: 1.72 mg/dL — ABNORMAL HIGH (ref 0.61–1.24)
GFR, Estimated: 42 mL/min — ABNORMAL LOW (ref >60)
Glucose, Bld: 169 mg/dL — ABNORMAL HIGH (ref 70–99)
Potassium: 3.5 mmol/L (ref 3.5–5.1)
Sodium: 132 mmol/L — ABNORMAL LOW (ref 135–145)
Total Bilirubin: 3.4 mg/dL — ABNORMAL HIGH (ref 0.3–1.2)
Total Protein: 5.8 g/dL — ABNORMAL LOW (ref 6.5–8.1)

## 2022-02-06 LAB — CBC
HCT: 30.6 % — ABNORMAL LOW (ref 39.0–52.0)
Hemoglobin: 10.9 g/dL — ABNORMAL LOW (ref 13.0–17.0)
MCH: 35.6 pg — ABNORMAL HIGH (ref 26.0–34.0)
MCHC: 35.6 g/dL (ref 30.0–36.0)
MCV: 100 fL (ref 80.0–100.0)
Platelets: 45 10*3/uL — ABNORMAL LOW (ref 150–400)
RBC: 3.06 MIL/uL — ABNORMAL LOW (ref 4.22–5.81)
RDW: 14.6 % (ref 11.5–15.5)
WBC: 4.3 10*3/uL (ref 4.0–10.5)
nRBC: 0 % (ref 0.0–0.2)

## 2022-02-06 MED ORDER — PROCHLORPERAZINE EDISYLATE 10 MG/2ML IJ SOLN
10.0000 mg | Freq: Four times a day (QID) | INTRAMUSCULAR | Status: DC | PRN
  Administered 2022-02-06: 10 mg via INTRAVENOUS
  Filled 2022-02-06: qty 2

## 2022-02-06 NOTE — Plan of Care (Signed)
  Problem: Health Behavior/Discharge Planning: Goal: Ability to manage health-related needs will improve Outcome: Progressing   

## 2022-02-06 NOTE — Progress Notes (Signed)
PROGRESS NOTE [name]  LKG:[id number] DOB: 10/03/1950 DOA: 02/04/2022 PCP: [name], FNP   Brief Narrative/Hospital Course: 71 y.o.m w/ history significant for liver cirrhosis, diabetes mellitus, CKD 3b creat~1.6-1.9,CAD, hypertension presented to the ED with fever chills abdominal distention decreased urine output, generalized abdominal discomfort.   In the ED was febrile BP stable, labs showed WBC 4.4, LFTS elevated but about baseline.  UA with positive nitrite.Platelets 54.Chest and abdominal x-ray -shows right greater than left horizontal linear basilar likely subsegmental atelectasis.Abdominal and pelvic CT without contrast-trace bilateral pleural effusions, slight increased size of moderate volume ascites.  Patient was admitted on IV ceftriaxone paracentesis was not attempted in the ED due to patient baseline thrombocytopenia, GI consulted and IR consulted and admitted due to concen for SBP. Underwent paracentesis 4.5 L removed, ascitic fluid studies with total nucleated cells 111, neutrophil 3 Gram stain no organism culture sent.  Patient continued on antibiotics last fever spike 10/2 8 AM    Subjective: Seen and examined this morning resting comfortably having his. Overnight patient is afebrile Mild abdomen distention present but no pain  Assessment and Plan: Principal Problem:   Fever Active Problems:   Decompensated hepatic cirrhosis (HCC)   CKD (chronic kidney disease) stage 3B, GFR 30-59 ml/min (HCC)   Thrombocytopenia (HCC)   Coronary atherosclerosis of native coronary artery   Essential hypertension   Type 2 diabetes with nephropathy (HCC)   Fever Possible UTI: Infectious work-up unremarkable with imaging with chest x-ray, CT abdomen pelvis-no acute significant finding see report for details.  UA positive for nitrite.  Started on empiric ceftriaxone s/p IR paracentesis 4.5 L of clear yellow ascitic fluid removed-hypertensive SBP in the setting of fluid.   Follow-up urine culture blood culture.  Continue empiric antibiotics    Decompensated hepatic cirrhosis with ascites, thrombocytopenia, coagulopathy with INR 1.7 edema anasarca and Transaminitis: Continue with lactulose PPI Aldactone midodrine Lasix.  S/p paracentesis as above last was in 9/19 4.9 L removed.  Thrombocythemia in the setting of liver disease monitor as a below. Recent Labs  Lab 02/04/22 1235 02/05/22 0543 02/06/22 0758  PLT 54* 50* 45*    CKD stage IIIb,creat~1.6-1.9, overall stable at baseline continue to monitor Recent Labs  Lab 02/04/22 1257 02/05/22 0543 02/06/22 0758  BUN 28* 29* 27*  CREATININE 2.04* 1.98* 1.72*     Type 2 diabetes mellitus A1c well controlled 6.4.  Uncontrolled hyperglycemia at 204 last night.  Holding linagliptin, continue SSI Recent Labs  Lab 02/05/22 0730 02/05/22 1123 02/05/22 1612 02/05/22 2259 02/06/22 0710  GLUCAP 153* 181* 170* 204* 166*   Essential hypertension BP well controlled on diuretics as above CAD,no chest pain  Hyponatremia suspect in the setting of patient's anasarca.  Monitor Anemia likely from chronic disease-chronic liver disease/CKD. Monitor hemoglobin as below Recent Labs  Lab 02/04/22 1235 02/05/22 0543 02/06/22 0758  HGB 11.2* 10.5* 10.9*  HCT 32.0* 30.4* 30.6*    DVT prophylaxis: SCDs Start: 02/04/22 2149 Code Status:   Code Status: Full Code Family Communication: plan of care discussed with patient at bedside.  Patient status is: Inpatient due to fever , further work-up and  for IV antibiotics  Level of care: Telemetry  Dispo: The patient is from: home            Anticipated disposition: home tomorrow if cultures are unremarkable and no fever   Mobility Assessment (last 72 hours)     Mobility Assessment     Row Name 02/05/22 2239 02/05/22 0000  02/04/22 2330       Does patient have an order for bedrest or is patient medically unstable No - Continue assessment No - Continue assessment No -  Continue assessment     What is the highest level of mobility based on the progressive mobility assessment? Level 5 (Walks with assist in room/hall) - Balance while stepping forward/back and can walk in room with assist - Complete Level 3 (Stands with assist) - Balance while standing  and cannot march in place --     Is the above level different from baseline mobility prior to current illness? Yes - Recommend PT order No - Consider discontinuing PT/OT --               Objective: Vitals last 24 hrs: Vitals:   02/05/22 1037 02/05/22 1206 02/05/22 2028 02/06/22 0519  BP: 118/64 118/64 111/62 111/63  Pulse: 81 90 92 84  Resp:  '18 16 14  '$ Temp: 98.3 F (36.8 C) 98.1 F (36.7 C) 99 F (37.2 C) 98.3 F (36.8 C)  TempSrc:    Oral  SpO2: 100% 100% 100% 98%  Weight:      Height:       Weight change:   Physical Examination: General exam: AAox3, older than stated age, weak appearing. HEENT:Oral mucosa moist, Ear/Nose WNL grossly, dentition normal. Respiratory system: bilaterally diminished, no use of accessory muscle Cardiovascular system: S1 & S2 +, No JVD,. Gastrointestinal system: Abdomen soft, mildly distended, nontender,BS+ Nervous System:Alert, awake, moving extremities and grossly nonfocal Extremities: LE ankle edema mild, distal peripheral pulses palpable.  Skin: No rashes,no icterus. MSK: Normal muscle bulk,tone, power   Medications reviewed:  Scheduled Meds:  furosemide  40 mg Oral Daily   insulin aspart  0-5 Units Subcutaneous QHS   insulin aspart  0-9 Units Subcutaneous TID WC   lactulose  10 g Oral BID   midodrine  20 mg Oral TID WC   pantoprazole  40 mg Oral BID   spironolactone  12.5 mg Oral Daily   tamsulosin  0.4 mg Oral QPC supper   Continuous Infusions:  cefTRIAXone (ROCEPHIN)  IV Stopped (02/05/22 1412)      Diet Order             Diet heart healthy/carb modified Room service appropriate? Yes; Fluid consistency: Thin  Diet effective now                   Intake/Output Summary (Last 24 hours) at 02/06/2022 1021 Last data filed at 02/06/2022 0900 Gross per 24 hour  Intake 680 ml  Output 1450 ml  Net -770 ml   Net IO Since Admission: -730 mL [02/06/22 1021]  Wt Readings from Last 3 Encounters:  02/04/22 71.7 kg  01/23/22 72.5 kg  01/16/22 72.5 kg     Unresulted Labs (From admission, onward)     Start     Ordered   02/07/22 2542  Basic metabolic panel  Daily at 5am,   R      02/06/22 0733   02/07/22 0500  CBC  Daily at 5am,   R      02/06/22 0733   02/05/22 0902  PH, Body Fluid (NOT for Pleural or CSF)  Once,   R        02/05/22 0902   02/05/22 0901  Aerobic/Anaerobic Culture w Gram Stain (surgical/deep wound)  Once,   R        02/05/22 0902   02/04/22 2230  Urine Culture  (  Urine Culture)  Add-on,   AD       Question:  Indication  Answer:  Sepsis   02/04/22 2229          Data Reviewed: I have personally reviewed following labs and imaging studies CBC: Recent Labs  Lab 02/04/22 1235 02/05/22 0543 02/06/22 0758  WBC 4.4 3.9* 4.3  NEUTROABS 3.4  --   --   HGB 11.2* 10.5* 10.9*  HCT 32.0* 30.4* 30.6*  MCV 101.3* 101.7* 100.0  PLT 54* 50* 45*   Basic Metabolic Panel: Recent Labs  Lab 02/04/22 1257 02/05/22 0543 02/06/22 0758  NA 131* 133* 132*  K 3.9 3.7 3.5  CL 96* 98 94*  CO2 '24 26 28  '$ GLUCOSE 198* 177* 169*  BUN 28* 29* 27*  CREATININE 2.04* 1.98* 1.72*  CALCIUM 8.7* 8.4* 8.3*   GFR: Estimated Creatinine Clearance: 41.1 mL/min (A) (by C-G formula based on SCr of 1.72 mg/dL (H)). Liver Function Tests: Recent Labs  Lab 02/04/22 1235 02/06/22 0758  AST 56* 60*  ALT 43 43  ALKPHOS 73 64  BILITOT 5.0* 3.4*  PROT 6.4* 5.8*  ALBUMIN 3.0* 2.7*   Recent Labs  Lab 02/04/22 1235  LIPASE 50   No results for input(s): "AMMONIA" in the last 168 hours. Coagulation Profile: Recent Labs  Lab 02/04/22 1444  INR 1.7*   BNP (last 3 results) No results for input(s): "PROBNP" in the last 8760  hours. HbA1C: No results for input(s): "HGBA1C" in the last 72 hours. CBG: Recent Labs  Lab 02/05/22 0730 02/05/22 1123 02/05/22 1612 02/05/22 2259 02/06/22 0710  GLUCAP 153* 181* 170* 204* 166*   Lipid Profile: No results for input(s): "CHOL", "HDL", "LDLCALC", "TRIG", "CHOLHDL", "LDLDIRECT" in the last 72 hours. Thyroid Function Tests: No results for input(s): "TSH", "T4TOTAL", "FREET4", "T3FREE", "THYROIDAB" in the last 72 hours. Sepsis Labs: No results for input(s): "PROCALCITON", "LATICACIDVEN" in the last 168 hours.  Recent Results (from the past 240 hour(s))  Blood culture (routine x 2)     Status: None (Preliminary result)   Collection Time: 02/04/22  5:06 PM   Specimen: Site Not Specified; Blood  Result Value Ref Range Status   Specimen Description   Final    SITE NOT SPECIFIED BOTTLES DRAWN AEROBIC AND ANAEROBIC   Special Requests Blood Culture adequate volume  Final   Culture   Final    NO GROWTH 2 DAYS Performed at [facility], [address] [place], [place] [address]    Report Status PENDING  Incomplete  Blood culture (routine x 2)     Status: None (Preliminary result)   Collection Time: 02/04/22  5:06 PM   Specimen: Site Not Specified; Blood  Result Value Ref Range Status   Specimen Description   Final    SITE NOT SPECIFIED BOTTLES DRAWN AEROBIC AND ANAEROBIC   Special Requests Blood Culture adequate volume  Final   Culture   Final    NO GROWTH 2 DAYS Performed at [facility], [address], [place], [place] [address]    Report Status PENDING  Incomplete  Resp Panel by RT-PCR (Flu A&B, Covid) Anterior Nasal Swab     Status: None   Collection Time: 02/04/22  6:35 PM   Specimen: Anterior Nasal Swab  Result Value Ref Range Status   SARS Coronavirus 2 by RT PCR NEGATIVE NEGATIVE Final    Comment: (NOTE) SARS-CoV-2 target nucleic acids are NOT DETECTED.  The SARS-CoV-2 RNA is generally detectable in upper respiratory specimens  during the acute phase  of infection. The lowest concentration of SARS-CoV-2 viral copies this assay can detect is 138 copies/mL. A negative result does not preclude SARS-Cov-2 infection and should not be used as the sole basis for treatment or other patient management decisions. A negative result may occur with  improper specimen collection/handling, submission of specimen other than nasopharyngeal swab, presence of viral mutation(s) within the areas targeted by this assay, and inadequate number of viral copies(<138 copies/mL). A negative result must be combined with clinical observations, patient history, and epidemiological information. The expected result is Negative.  Fact Sheet for Patients:  EntrepreneurPulse.com.au  Fact Sheet for Healthcare Providers:  IncredibleEmployment.be  This test is no t yet approved or cleared by the [place] FDA and  has been authorized for detection and/or diagnosis of SARS-CoV-2 by FDA under an Emergency Use Authorization (EUA). This EUA will remain  in effect (meaning this test can be used) for the duration of the COVID-19 declaration under Section 564(b)(1) of the Act, 21 U.S.C.section 360bbb-3(b)(1), unless the authorization is terminated  or revoked sooner.       Influenza A by PCR NEGATIVE NEGATIVE Final   Influenza B by PCR NEGATIVE NEGATIVE Final    Comment: (NOTE) The Xpert Xpress SARS-CoV-2/FLU/RSV plus assay is intended as an aid in the diagnosis of influenza from Nasopharyngeal swab specimens and should not be used as a sole basis for treatment. Nasal washings and aspirates are unacceptable for Xpert Xpress SARS-CoV-2/FLU/RSV testing.  Fact Sheet for Patients: EntrepreneurPulse.com.au  Fact Sheet for Healthcare Providers: IncredibleEmployment.be  This test is not yet approved or cleared by the [place] FDA and has been authorized for detection and/or diagnosis of  SARS-CoV-2 by FDA under an Emergency Use Authorization (EUA). This EUA will remain in effect (meaning this test can be used) for the duration of the COVID-19 declaration under Section 564(b)(1) of the Act, 21 U.S.C. section 360bbb-3(b)(1), unless the authorization is terminated or revoked.  Performed at [facility], [address], [place], [place] [address]   Culture, body fluid w Gram Stain-bottle     Status: None (Preliminary result)   Collection Time: 02/05/22  9:01 AM   Specimen: Peritoneal Washings  Result Value Ref Range Status   Specimen Description PERITONEAL  Final   Special Requests BOTTLES DRAWN AEROBIC AND ANAEROBIC 10CC  Final   Culture   Final    NO GROWTH < 24 HOURS Performed at [facility], [address], [place], [place] [address]    Report Status PENDING  Incomplete  Gram stain     Status: None   Collection Time: 02/05/22  9:01 AM   Specimen: Peritoneal Washings  Result Value Ref Range Status   Specimen Description PERITONEAL  Final   Special Requests NONE  Final   Gram Stain   Final    NO ORGANISMS SEEN WBC PRESENT, PREDOMINANTLY MONONUCLEAR CYTOSPIN SMEAR Performed at [facility], [address], [place], [place] [address]    Report Status 02/05/2022 FINAL  Final    Antimicrobials: Anti-infectives (From admission, onward)    Start     Dose/Rate Route Frequency Ordered Stop   02/05/22 1500  cefTRIAXone (ROCEPHIN) 2 g in sodium chloride 0.9 % 100 mL IVPB        2 g 200 mL/hr over 30 Minutes Intravenous Every 24 hours 02/04/22 2148     02/04/22 2115  cefTRIAXone (ROCEPHIN) 1 g in sodium chloride 0.9 % 100 mL IVPB  Note to Pharmacy: Total 2 g for intra-abd infection   1 g 200 mL/hr over 30 Minutes Intravenous  Once 02/04/22 2109 02/04/22 2253   02/04/22 1700  cefTRIAXone (ROCEPHIN) 1 g in sodium chloride 0.9 % 100 mL IVPB        1 g 200 mL/hr over 30 Minutes Intravenous  Once 02/04/22 1646 02/04/22 1820      Culture/Microbiology    Component  Value Date/Time   SDES PERITONEAL 02/05/2022 0901   SDES PERITONEAL 02/05/2022 0901   SPECREQUEST BOTTLES DRAWN AEROBIC AND ANAEROBIC 10CC 02/05/2022 0901   SPECREQUEST NONE 02/05/2022 0901   CULT  02/05/2022 0901    NO GROWTH < 24 HOURS Performed at [facility], [address], [place], [place] [address]    REPTSTATUS PENDING 02/05/2022 0901   REPTSTATUS 02/05/2022 FINAL 02/05/2022 0901    Other culture-see note  Radiology Studies: US Paracentesis  Result Date: 02/05/2022 Lavonia Dana, MD     02/05/2022  9:54 AM PreOperative Dx: Cirrhosis, ascites Postoperative Dx: Cirrhosis, ascites Procedure:   US guided paracentesis Radiologist:  Thornton Papas Anesthesia:  10 ml of1% lidocaine Specimen:  4.5 L of clear yellow ascitic fluid EBL:   < 1 ml Complications:  None    CT ABDOMEN PELVIS WO CONTRAST  Result Date: 02/04/2022 CLINICAL DATA:  Bowel obstruction suspected EXAM: CT ABDOMEN AND PELVIS WITHOUT CONTRAST TECHNIQUE: Multidetector CT imaging of the abdomen and pelvis was performed following the standard protocol without IV contrast. RADIATION DOSE REDUCTION: This exam was performed according to the departmental dose-optimization program which includes automated exposure control, adjustment of the mA and/or kV according to patient size and/or use of iterative reconstruction technique. COMPARISON:  CT abdomen pelvis 11/24/2021 FINDINGS: Lower chest: Trace bilateral pleural effusions. Thickening of the visualized distal esophagus likely due to paraseptal varices. Four-vessel coronary artery calcification. Hepatobiliary: Nodular hepatic contour. Enlarged caudate lobe. No focal liver abnormality. Calcified gallstones noted within the gallbladder lumen. No gallbladder wall thickening or pericholecystic fluid. No biliary dilatation. Pancreas: No focal lesion. Normal pancreatic contour. No surrounding inflammatory changes. No main pancreatic ductal dilatation. Spleen: The spleen is enlarged in size measuring up to  14 cm. No focal splenic lesion. Adrenals/Urinary Tract: No adrenal nodule bilaterally. No nephrolithiasis and no hydronephrosis. No definite contour-deforming renal mass. No ureterolithiasis or hydroureter. The urinary bladder is unremarkable. Stomach/Bowel: Stomach is within normal limits. No evidence of bowel wall thickening or dilatation. Portal colopathy of the ascending colon. Sigmoid diverticulosis. Appendix appears normal. Vascular/Lymphatic: No abdominal aorta or iliac aneurysm. Mild to moderate atherosclerotic plaque of the aorta and its branches. No abdominal, pelvic, or inguinal lymphadenopathy. Reproductive: Prostate is unremarkable. Other: Interval slight increase in moderate volume free intraperitoneal fluid. Esophageal and paraesophageal venous collaterals. No intraperitoneal free gas. No organized fluid collection. Musculoskeletal: No abdominal wall hernia or abnormality. No suspicious lytic or blastic osseous lesions. No acute displaced fracture. Multilevel degenerative changes of the spine. IMPRESSION: 1. Trace bilateral pleural effusions. 2. Slight interval increase in size of moderate volume ascites. 3. Cirrhosis with portal hypertension. No focal liver lesions identified. Please note that liver protocol enhanced MR and CT are the most sensitive tests for the screening detection of hepatocellular carcinoma in the high risk setting of cirrhosis. 4. Thickening of the visualized distal esophagus likely due to esophageal and paraesophageal varices. Markedly limited evaluation due to noncontrast study. 5. Portal colopathy of the ascending colon. 6. Cholelithiasis with no CT finding of acute cholecystitis. 7. Colonic diverticulosis with no acute diverticulitis.  8. Aortic Atherosclerosis (ICD10-I70.0) including four-vessel coronary calcification. Electronically Signed   By: Iven Finn M.D.   On: 02/04/2022 16:10   DG Abdomen Acute W/Chest  Result Date: 02/04/2022 CLINICAL DATA:  Cough. Stomach  distention. Fever, chills, and fatigue. Decreased urinary output. EXAM: DG ABDOMEN ACUTE WITH 1 VIEW CHEST COMPARISON:  Chest radiographs 01/23/2022 and 12/08/2021. CT abdomen and pelvis 11/24/2021 FINDINGS: Cardiac silhouette and mediastinal contours are within normal limits. Moderately decreased lung volumes, mildly worsened from 01/23/2022 most recent prior chest radiograph. Right-greater-than-left horizontal linear basilar likely subsegmental atelectasis is mildly increased from 01/22/2022. No pleural effusion or pneumothorax. Moderate stool within the ascending and transverse colon. Air is seen within loops of small bowel as well as the ascending, transverse, descending colon, and rectum. On supine view, there is a single loop of vertically oriented bowel overlying the lateral left hemiabdomen that is mildly distended, measuring up to 3.5 cm. Mild-to-moderate multilevel degenerative disc changes of the visualized thoracic and lumbar spine. Mild levocurvature of the upper lumbar spine. No subdiaphragmatic free air on upright view. IMPRESSION: 1. Right-greater-than-left horizontal linear basilar likely subsegmental atelectasis. 2. Moderate stool within the ascending and transverse colon. 3. On supine view, there is a single loop of vertically oriented bowel overlying the lateral left hemiabdomen that is mildly distended. This may represent a focal ileus. Air is seen within the more distal colon. Electronically Signed   By: Yvonne Kendall M.D.   On: 02/04/2022 15:02     LOS: 1 day   Antonieta Pert, MD [facility]  02/06/2022, 10:21 AM

## 2022-02-07 ENCOUNTER — Encounter (HOSPITAL_COMMUNITY): Payer: Self-pay | Admitting: Internal Medicine

## 2022-02-07 LAB — BASIC METABOLIC PANEL
Anion gap: 13 (ref 5–15)
BUN: 29 mg/dL — ABNORMAL HIGH (ref 8–23)
CO2: 26 mmol/L (ref 22–32)
Calcium: 8.8 mg/dL — ABNORMAL LOW (ref 8.9–10.3)
Chloride: 94 mmol/L — ABNORMAL LOW (ref 98–111)
Creatinine, Ser: 1.71 mg/dL — ABNORMAL HIGH (ref 0.61–1.24)
GFR, Estimated: 43 mL/min — ABNORMAL LOW (ref >60)
Glucose, Bld: 165 mg/dL — ABNORMAL HIGH (ref 70–99)
Potassium: 3.2 mmol/L — ABNORMAL LOW (ref 3.5–5.1)
Sodium: 133 mmol/L — ABNORMAL LOW (ref 135–145)

## 2022-02-07 LAB — GLUCOSE, CAPILLARY
Glucose-Capillary: 170 mg/dL — ABNORMAL HIGH (ref 70–99)
Glucose-Capillary: 248 mg/dL — ABNORMAL HIGH (ref 70–99)
Glucose-Capillary: 266 mg/dL — ABNORMAL HIGH (ref 70–99)

## 2022-02-07 LAB — CBC
HCT: 32.9 % — ABNORMAL LOW (ref 39.0–52.0)
Hemoglobin: 11.6 g/dL — ABNORMAL LOW (ref 13.0–17.0)
MCH: 35.3 pg — ABNORMAL HIGH (ref 26.0–34.0)
MCHC: 35.3 g/dL (ref 30.0–36.0)
MCV: 100 fL (ref 80.0–100.0)
Platelets: 51 10*3/uL — ABNORMAL LOW (ref 150–400)
RBC: 3.29 MIL/uL — ABNORMAL LOW (ref 4.22–5.81)
RDW: 14.4 % (ref 11.5–15.5)
WBC: 5 10*3/uL (ref 4.0–10.5)
nRBC: 0 % (ref 0.0–0.2)

## 2022-02-07 LAB — PH, BODY FLUID: pH, Body Fluid: 7.4

## 2022-02-07 LAB — AMMONIA: Ammonia: 15 umol/L (ref 9–35)

## 2022-02-07 LAB — URINE CULTURE: Culture: 70000 — AB

## 2022-02-07 LAB — CYTOLOGY - NON PAP

## 2022-02-07 MED ORDER — INSULIN DETEMIR 100 UNIT/ML ~~LOC~~ SOLN
5.0000 [IU] | Freq: Every day | SUBCUTANEOUS | Status: DC
  Administered 2022-02-07 – 2022-02-08 (×2): 5 [IU] via SUBCUTANEOUS
  Filled 2022-02-07 (×3): qty 0.05

## 2022-02-07 MED ORDER — POTASSIUM CHLORIDE CRYS ER 20 MEQ PO TBCR
40.0000 meq | EXTENDED_RELEASE_TABLET | Freq: Once | ORAL | Status: AC
  Administered 2022-02-07: 40 meq via ORAL
  Filled 2022-02-07: qty 2

## 2022-02-07 NOTE — Evaluation (Signed)
Physical Therapy Evaluation Patient Details Name: Edward [name] MRN: [id number] DOB: [birth year] Today's Date: 02/07/2022  History of Present Illness  Edward [name] is a 71 y.o. male with medical history significant for liver cirrhosis, diabetes mellitus, CKD 3, coronary artery disease, hypertension.  Patient presented to the ED with complaints of fever at home, chills, abdominal distention and decreased urine output.  He reports a fever of up to 100 at home.  Abdominal distention has been gradual, he reports a generalized abdominal "soreness".  He denies pain with urination.  Denies cough, no difficulty breathing.  No chest pain.  No vomiting, no change in bowel habits.  He reports chronic swelling to his bilateral lower extremity that is unchanged.   Clinical Impression  Patient functioning near baseline for functional mobility and gait other than requiring verbal cues to let arms swing during ambulation with fair/good carryover, no loss of balance and demonstrates good return for transferring to/from commode in bathroom and chair at bedside.  Patient encouraged to stay out of bed and ambulate with nursing staff as tolerated for length of stay.  Plan:  Patient discharged from physical therapy to care of nursing for ambulation daily as tolerated for length of stay.         Recommendations for follow up therapy are one component of a multi-disciplinary discharge planning process, led by the attending physician.  Recommendations may be updated based on patient status, additional functional criteria and insurance authorization.  Follow Up Recommendations No PT follow up      Assistance Recommended at Discharge Set up Supervision/Assistance  Patient can return home with the following  A little help with walking and/or transfers;A little help with bathing/dressing/bathroom;Help with stairs or ramp for entrance;Assistance with cooking/housework    Equipment Recommendations None recommended by PT   Recommendations for Other Services       Functional Status Assessment Patient has had a recent decline in their functional status and/or demonstrates limited ability to make significant improvements in function in a reasonable and predictable amount of time     Precautions / Restrictions Precautions Precautions: None Restrictions Weight Bearing Restrictions: No      Mobility  Bed Mobility Overal bed mobility: Modified Independent                  Transfers Overall transfer level: Modified independent                      Ambulation/Gait Ambulation/Gait assistance: Modified independent (Device/Increase time), Supervision Gait Distance (Feet): 100 Feet Assistive device: None, 1 person hand held assist Gait Pattern/deviations: Decreased step length - right, Decreased step length - left, Decreased stride length Gait velocity: decreased     General Gait Details: slightly labored cadence requiring verbal cues for letting arms swing with fair/good carryover, no loss of balance and limited mostly due to fatigue  Stairs            Wheelchair Mobility    Modified Rankin (Stroke Patients Only)       Balance Overall balance assessment: No apparent balance deficits (not formally assessed)                                           Pertinent Vitals/Pain Pain Assessment Pain Assessment: No/denies pain    Home Living Family/patient expects to be discharged to:: Private residence Living Arrangements: Alone  Available Help at Discharge: Family;Available PRN/intermittently Type of Home: House Home Access: Stairs to enter Entrance Stairs-Rails: None Entrance Stairs-Number of Steps: 1   Home Layout: One level Home Equipment: Shower seat      Prior Function Prior Level of Function : Independent/Modified Independent             Mobility Comments: Patient reports being able to ambulate at home and in the community independently with  no AD. Currently driving ADLs Comments: Patient reports being indepenent with ADL's.     Hand Dominance        Extremity/Trunk Assessment   Upper Extremity Assessment Upper Extremity Assessment: Overall WFL for tasks assessed    Lower Extremity Assessment Lower Extremity Assessment: Overall WFL for tasks assessed    Cervical / Trunk Assessment Cervical / Trunk Assessment: Normal  Communication   Communication: No difficulties  Cognition Arousal/Alertness: Awake/alert Behavior During Therapy: WFL for tasks assessed/performed Overall Cognitive Status: Within Functional Limits for tasks assessed                                          General Comments      Exercises     Assessment/Plan    PT Assessment Patient does not need any further PT services  PT Problem List         PT Treatment Interventions      PT Goals (Current goals can be found in the Care Plan section)  Acute Rehab PT Goals Patient Stated Goal: return home with family to assist PT Goal Formulation: With patient Time For Goal Achievement: 02/07/22 Potential to Achieve Goals: Good    Frequency       Co-evaluation               AM-PAC PT "6 Clicks" Mobility  Outcome Measure Help needed turning from your back to your side while in a flat bed without using bedrails?: None Help needed moving from lying on your back to sitting on the side of a flat bed without using bedrails?: None Help needed moving to and from a bed to a chair (including a wheelchair)?: None Help needed standing up from a chair using your arms (e.g., wheelchair or bedside chair)?: None Help needed to walk in hospital room?: A Little Help needed climbing 3-5 steps with a railing? : A Little 6 Click Score: 22    End of Session   Activity Tolerance: Patient tolerated treatment well;Patient limited by fatigue Patient left: in chair;with call bell/phone within reach Nurse Communication: Mobility status PT  Visit Diagnosis: Unsteadiness on feet (R26.81);Other abnormalities of gait and mobility (R26.89);Muscle weakness (generalized) (M62.81)    Time: 0254-2706 PT Time Calculation (min) (ACUTE ONLY): 14 min   Charges:   PT Evaluation $PT Eval Low Complexity: 1 Low PT Treatments $Therapeutic Activity: 8-22 mins        12:16 PM, 02/07/22 Lonell Grandchild, MPT Physical Therapist with [facility] 336 [phone] office [phone] mobile phone

## 2022-02-07 NOTE — Progress Notes (Addendum)
PROGRESS NOTE Edward [name] [facility]  NLG:[id number] DOB: July 19, 1950 DOA: 02/04/2022 PCP: [name], FNP   Brief Narrative/Hospital Course: 71 y.o.m w/ history significant for liver cirrhosis, diabetes mellitus, CKD 3b creat~1.6-1.9,CAD, hypertension presented to the ED with fever chills abdominal distention decreased urine output, generalized abdominal discomfort.   In the ED was febrile BP stable, labs showed WBC 4.4, LFTS elevated but about baseline.  UA with positive nitrite.Platelets 54.Chest and abdominal x-ray -shows basilar likely subsegmental atelectasis.Abdominal and pelvic CT without contrast-trace bilateral pleural effusions, slight increased size of moderate volume ascites.  -Admitted, started on ceftriaxone Underwent paracentesis 4.5 L removed, ascitic fluid studies with total nucleated cells 111, neutrophil 3 Gram stain no organism culture sent.  Patient continued on antibiotics last fever spike 10/2 8 AM    Subjective: -Feel weak, was nauseated last night, very poor p.o. intake, no further fevers  Assessment and Plan:  Fever UTI: UA abnormal, urine culture growing 70,000 colonies of GNR, follow-up speciation and sensitivities  -Ascitic fluid negative for SBP, cultures also negative, CT abdomen and chest x-ray unremarkable for infectious process -Continue IV ceftriaxone today -Increase activity, PT OT eval   Decompensated hepatic cirrhosis with ascites, thrombocytopenia, coagulopathy with INR 1.7 edema anasarca and Transaminitis: Continue with lactulose PPI Aldactone midodrine Lasix.  - S/p paracentesis as above last was in 9/19 4.9 L removed. -Also check ammonia level  Chronic hypotension -On high-dose midodrine at baseline, continued  Thrombocythemia  -Secondary to chronic liver disease  CKD stage IIIb,creat~1.6-1.9, overall stable at baseline     Type 2 diabetes mellitus A1c well controlled 6.4.  -Linagliptin on hold, CBGs elevated, add low-dose  Levemir  Essential hypertension BP well controlled on diuretics as above CAD,no chest pain  Hyponatremia  -Likely from diuretics, improving  Hypokalemia -Repeat Lasix  Anemia likely from -chronic liver disease/CKD -Stable, monitor  DVT prophylaxis: SCDs Start: 02/04/22 2149 Code Status:   Code Status: Full Code Family Communication: plan of care discussed with patient at bedside. Disposition, home likely tomorrow  Mobility Assessment (last 72 hours)     Mobility Assessment     Row Name 02/06/22 2300 02/05/22 2239 02/05/22 0000 02/04/22 2330     Does patient have an order for bedrest or is patient medically unstable No - Continue assessment No - Continue assessment No - Continue assessment No - Continue assessment    What is the highest level of mobility based on the progressive mobility assessment? Level 5 (Walks with assist in room/hall) - Balance while stepping forward/back and can walk in room with assist - Complete Level 5 (Walks with assist in room/hall) - Balance while stepping forward/back and can walk in room with assist - Complete Level 3 (Stands with assist) - Balance while standing  and cannot march in place --    Is the above level different from baseline mobility prior to current illness? -- Yes - Recommend PT order No - Consider discontinuing PT/OT --              Objective: Vitals last 24 hrs: Vitals:   02/06/22 0519 02/06/22 1245 02/06/22 2114 02/07/22 0430  BP: 111/63 107/62 116/67 112/67  Pulse: 84 89 75 95  Resp: '14 16 20 16  '$ Temp: 98.3 F (36.8 C) 98.2 F (36.8 C) 98 F (36.7 C) (!) 97.4 F (36.3 C)  TempSrc: Oral Oral Oral   SpO2: 98% 100% 99% 98%  Weight:      Height:       Weight change:  Physical Examination: Gen: Awake, Alert, Oriented X 2, tired appearing HEENT: no JVD Lungs: Good air movement bilaterally, CTAB CVS: S1S2/RRR Abd: soft, Non tender,  distended, BS present, + fluid thrill Extremities: No edema Skin: no new rashes on  exposed skin   Medications reviewed:  Scheduled Meds:  furosemide  40 mg Oral Daily   insulin aspart  0-5 Units Subcutaneous QHS   insulin aspart  0-9 Units Subcutaneous TID WC   lactulose  10 g Oral BID   midodrine  20 mg Oral TID WC   pantoprazole  40 mg Oral BID   potassium chloride  40 mEq Oral Once   spironolactone  12.5 mg Oral Daily   tamsulosin  0.4 mg Oral QPC supper   Continuous Infusions:  cefTRIAXone (ROCEPHIN)  IV 2 g (02/06/22 1358)      Diet Order             Diet heart healthy/carb modified Room service appropriate? Yes; Fluid consistency: Thin  Diet effective now                  Intake/Output Summary (Last 24 hours) at 02/07/2022 0736 Last data filed at 02/07/2022 4235 Gross per 24 hour  Intake 540 ml  Output 300 ml  Net 240 ml   Net IO Since Admission: -590 mL [02/07/22 0736]  Wt Readings from Last 3 Encounters:  02/04/22 71.7 kg  01/23/22 72.5 kg  01/16/22 72.5 kg     Unresulted Labs (From admission, onward)     Start     Ordered   02/07/22 3614  Basic metabolic panel  Daily at 5am,   R      02/06/22 0733   02/07/22 0500  CBC  Daily at 5am,   R      02/06/22 0733   02/05/22 0902  PH, Body Fluid (NOT for Pleural or CSF)  Once,   R        02/05/22 0902   02/05/22 0901  Aerobic/Anaerobic Culture w Gram Stain (surgical/deep wound)  Once,   R        02/05/22 0902          Data Reviewed: I have personally reviewed following labs and imaging studies CBC: Recent Labs  Lab 02/04/22 1235 02/05/22 0543 02/06/22 0758 02/07/22 0319  WBC 4.4 3.9* 4.3 5.0  NEUTROABS 3.4  --   --   --   HGB 11.2* 10.5* 10.9* 11.6*  HCT 32.0* 30.4* 30.6* 32.9*  MCV 101.3* 101.7* 100.0 100.0  PLT 54* 50* 45* 51*   Basic Metabolic Panel: Recent Labs  Lab 02/04/22 1257 02/05/22 0543 02/06/22 0758 02/07/22 0319  NA 131* 133* 132* 133*  K 3.9 3.7 3.5 3.2*  CL 96* 98 94* 94*  CO2 '24 26 28 26  '$ GLUCOSE 198* 177* 169* 165*  BUN 28* 29* 27* 29*   CREATININE 2.04* 1.98* 1.72* 1.71*  CALCIUM 8.7* 8.4* 8.3* 8.8*   GFR: Estimated Creatinine Clearance: 41.3 mL/min (A) (by C-G formula based on SCr of 1.71 mg/dL (H)). Liver Function Tests: Recent Labs  Lab 02/04/22 1235 02/06/22 0758  AST 56* 60*  ALT 43 43  ALKPHOS 73 64  BILITOT 5.0* 3.4*  PROT 6.4* 5.8*  ALBUMIN 3.0* 2.7*   Recent Labs  Lab 02/04/22 1235  LIPASE 50   No results for input(s): "AMMONIA" in the last 168 hours. Coagulation Profile: Recent Labs  Lab 02/04/22 1444  INR 1.7*   BNP (last 3 results) No  results for input(s): "PROBNP" in the last 8760 hours. HbA1C: No results for input(s): "HGBA1C" in the last 72 hours. CBG: Recent Labs  Lab 02/06/22 0710 02/06/22 1129 02/06/22 1702 02/06/22 2117 02/07/22 0710  GLUCAP 166* 283* 262* 220* 170*   Lipid Profile: No results for input(s): "CHOL", "HDL", "LDLCALC", "TRIG", "CHOLHDL", "LDLDIRECT" in the last 72 hours. Thyroid Function Tests: No results for input(s): "TSH", "T4TOTAL", "FREET4", "T3FREE", "THYROIDAB" in the last 72 hours. Sepsis Labs: No results for input(s): "PROCALCITON", "LATICACIDVEN" in the last 168 hours.  Recent Results (from the past 240 hour(s))  Urine Culture     Status: Abnormal   Collection Time: 02/04/22 12:16 PM   Specimen: Urine, Clean Catch  Result Value Ref Range Status   Specimen Description   Final    URINE, CLEAN CATCH Performed at [facility], [address], [place], [place] [address]    Special Requests   Final    NONE Performed at [facility], [address] [place], [place] [address]    Culture 70,000 COLONIES/mL ESCHERICHIA COLI (A)  Final   Report Status 02/07/2022 FINAL  Final   Organism ID, Bacteria ESCHERICHIA COLI (A)  Final      Susceptibility   Escherichia coli - MIC*    AMPICILLIN >=32 RESISTANT Resistant     CEFAZOLIN <=4 SENSITIVE Sensitive     CEFEPIME <=0.12 SENSITIVE Sensitive     CEFTRIAXONE <=0.25 SENSITIVE Sensitive     CIPROFLOXACIN  >=4 RESISTANT Resistant     GENTAMICIN <=1 SENSITIVE Sensitive     IMIPENEM <=0.25 SENSITIVE Sensitive     NITROFURANTOIN <=16 SENSITIVE Sensitive     TRIMETH/SULFA >=320 RESISTANT Resistant     AMPICILLIN/SULBACTAM 8 SENSITIVE Sensitive     PIP/TAZO <=4 SENSITIVE Sensitive     * 70,000 COLONIES/mL ESCHERICHIA COLI  Blood culture (routine x 2)     Status: None (Preliminary result)   Collection Time: 02/04/22  5:06 PM   Specimen: Site Not Specified; Blood  Result Value Ref Range Status   Specimen Description   Final    SITE NOT SPECIFIED BOTTLES DRAWN AEROBIC AND ANAEROBIC   Special Requests Blood Culture adequate volume  Final   Culture   Final    NO GROWTH 2 DAYS Performed at [facility], [address], [place], [place] [address]    Report Status PENDING  Incomplete  Blood culture (routine x 2)     Status: None (Preliminary result)   Collection Time: 02/04/22  5:06 PM   Specimen: Site Not Specified; Blood  Result Value Ref Range Status   Specimen Description   Final    SITE NOT SPECIFIED BOTTLES DRAWN AEROBIC AND ANAEROBIC   Special Requests Blood Culture adequate volume  Final   Culture   Final    NO GROWTH 2 DAYS Performed at [facility], [address] [place],  [address]    Report Status PENDING  Incomplete  Resp Panel by RT-PCR (Flu A&B, Covid) Anterior Nasal Swab     Status: None   Collection Time: 02/04/22  6:35 PM   Specimen: Anterior Nasal Swab  Result Value Ref Range Status   SARS Coronavirus 2 by RT PCR NEGATIVE NEGATIVE Final    Comment: (NOTE) SARS-CoV-2 target nucleic acids are NOT DETECTED.  The SARS-CoV-2 RNA is generally detectable in upper respiratory specimens during the acute phase of infection. The lowest concentration of SARS-CoV-2 viral copies this assay can detect is 138 copies/mL. A negative result does not preclude SARS-Cov-2  infection and should not be used as the sole basis for treatment or other patient management decisions. A  negative result may occur with  improper specimen collection/handling, submission of specimen other than nasopharyngeal swab, presence of viral mutation(s) within the areas targeted by this assay, and inadequate number of viral copies(<138 copies/mL). A negative result must be combined with clinical observations, patient history, and epidemiological information. The expected result is Negative.  Fact Sheet for Patients:  EntrepreneurPulse.com.au  Fact Sheet for Healthcare Providers:  IncredibleEmployment.be  This test is no t yet approved or cleared by the [place] FDA and  has been authorized for detection and/or diagnosis of SARS-CoV-2 by FDA under an Emergency Use Authorization (EUA). This EUA will remain  in effect (meaning this test can be used) for the duration of the COVID-19 declaration under Section 564(b)(1) of the Act, 21 U.S.C.section 360bbb-3(b)(1), unless the authorization is terminated  or revoked sooner.       Influenza A by PCR NEGATIVE NEGATIVE Final   Influenza B by PCR NEGATIVE NEGATIVE Final    Comment: (NOTE) The Xpert Xpress SARS-CoV-2/FLU/RSV plus assay is intended as an aid in the diagnosis of influenza from Nasopharyngeal swab specimens and should not be used as a sole basis for treatment. Nasal washings and aspirates are unacceptable for Xpert Xpress SARS-CoV-2/FLU/RSV testing.  Fact Sheet for Patients: EntrepreneurPulse.com.au  Fact Sheet for Healthcare Providers: IncredibleEmployment.be  This test is not yet approved or cleared by the [place] FDA and has been authorized for detection and/or diagnosis of SARS-CoV-2 by FDA under an Emergency Use Authorization (EUA). This EUA will remain in effect (meaning this test can be used) for the duration of the COVID-19 declaration under Section 564(b)(1) of the Act, 21 U.S.C. section 360bbb-3(b)(1), unless the authorization  is terminated or revoked.  Performed at [facility], [address], [place], [place] [address]   Culture, body fluid w Gram Stain-bottle     Status: None (Preliminary result)   Collection Time: 02/05/22  9:01 AM   Specimen: Peritoneal Washings  Result Value Ref Range Status   Specimen Description PERITONEAL  Final   Special Requests BOTTLES DRAWN AEROBIC AND ANAEROBIC 10CC  Final   Culture   Final    NO GROWTH < 24 HOURS Performed at [facility], [address] [place], [place] [address]    Report Status PENDING  Incomplete  Gram stain     Status: None   Collection Time: 02/05/22  9:01 AM   Specimen: Peritoneal Washings  Result Value Ref Range Status   Specimen Description PERITONEAL  Final   Special Requests NONE  Final   Gram Stain   Final    NO ORGANISMS SEEN WBC PRESENT, PREDOMINANTLY MONONUCLEAR CYTOSPIN SMEAR Performed at [facility], [address] [place], [place] [address]    Report Status 02/05/2022 FINAL  Final    Antimicrobials: Anti-infectives (From admission, onward)    Start     Dose/Rate Route Frequency Ordered Stop   02/05/22 1500  cefTRIAXone (ROCEPHIN) 2 g in sodium chloride 0.9 % 100 mL IVPB        2 g 200 mL/hr over 30 Minutes Intravenous Every 24 hours 02/04/22 2148     02/04/22 2115  cefTRIAXone (ROCEPHIN) 1 g in sodium chloride 0.9 % 100 mL IVPB       Note to Pharmacy: Total 2 g for intra-abd infection   1 g 200 mL/hr over 30 Minutes Intravenous  Once 02/04/22 2109 02/04/22 2253  02/04/22 1700  cefTRIAXone (ROCEPHIN) 1 g in sodium chloride 0.9 % 100 mL IVPB        1 g 200 mL/hr over 30 Minutes Intravenous  Once 02/04/22 1646 02/04/22 1820      Culture/Microbiology    Component Value Date/Time   SDES PERITONEAL 02/05/2022 0901   SDES PERITONEAL 02/05/2022 0901   SPECREQUEST BOTTLES DRAWN AEROBIC AND ANAEROBIC 10CC 02/05/2022 0901   SPECREQUEST NONE 02/05/2022 0901   CULT  02/05/2022 0901    NO GROWTH < 24 HOURS Performed at [facility], [address] [place], [place] [address]    REPTSTATUS PENDING 02/05/2022 0901   REPTSTATUS 02/05/2022 FINAL 02/05/2022 0901    Other culture-see note  Radiology Studies: US Paracentesis  Result Date: 02/05/2022 Lavonia Dana, MD     02/05/2022  9:54 AM PreOperative Dx: Cirrhosis, ascites Postoperative Dx: Cirrhosis, ascites Procedure:   US guided paracentesis Radiologist:  Thornton Papas Anesthesia:  10 ml of1% lidocaine Specimen:  4.5 L of clear yellow ascitic fluid EBL:   < 1 ml Complications:  None      LOS: 2 days   Domenic Polite, MD [facility]  02/07/2022, 7:36 AM

## 2022-02-07 NOTE — Plan of Care (Signed)

## 2022-02-08 ENCOUNTER — Other Ambulatory Visit: Payer: Self-pay | Admitting: Nurse Practitioner

## 2022-02-08 DIAGNOSIS — A419 Sepsis, unspecified organism: Principal | ICD-10-CM

## 2022-02-08 DIAGNOSIS — N390 Urinary tract infection, site not specified: Secondary | ICD-10-CM

## 2022-02-08 LAB — COMPREHENSIVE METABOLIC PANEL
ALT: 43 U/L (ref 0–44)
AST: 54 U/L — ABNORMAL HIGH (ref 15–41)
Albumin: 2.6 g/dL — ABNORMAL LOW (ref 3.5–5.0)
Alkaline Phosphatase: 62 U/L (ref 38–126)
Anion gap: 13 (ref 5–15)
BUN: 32 mg/dL — ABNORMAL HIGH (ref 8–23)
CO2: 25 mmol/L (ref 22–32)
Calcium: 8.5 mg/dL — ABNORMAL LOW (ref 8.9–10.3)
Chloride: 96 mmol/L — ABNORMAL LOW (ref 98–111)
Creatinine, Ser: 1.63 mg/dL — ABNORMAL HIGH (ref 0.61–1.24)
GFR, Estimated: 45 mL/min — ABNORMAL LOW (ref >60)
Glucose, Bld: 164 mg/dL — ABNORMAL HIGH (ref 70–99)
Potassium: 3.3 mmol/L — ABNORMAL LOW (ref 3.5–5.1)
Sodium: 134 mmol/L — ABNORMAL LOW (ref 135–145)
Total Bilirubin: 2.2 mg/dL — ABNORMAL HIGH (ref 0.3–1.2)
Total Protein: 5.6 g/dL — ABNORMAL LOW (ref 6.5–8.1)

## 2022-02-08 LAB — CBC
HCT: 29.6 % — ABNORMAL LOW (ref 39.0–52.0)
Hemoglobin: 10.4 g/dL — ABNORMAL LOW (ref 13.0–17.0)
MCH: 35 pg — ABNORMAL HIGH (ref 26.0–34.0)
MCHC: 35.1 g/dL (ref 30.0–36.0)
MCV: 99.7 fL (ref 80.0–100.0)
Platelets: 54 10*3/uL — ABNORMAL LOW (ref 150–400)
RBC: 2.97 MIL/uL — ABNORMAL LOW (ref 4.22–5.81)
RDW: 14.4 % (ref 11.5–15.5)
WBC: 5.3 10*3/uL (ref 4.0–10.5)
nRBC: 0 % (ref 0.0–0.2)

## 2022-02-08 LAB — GLUCOSE, CAPILLARY
Glucose-Capillary: 253 mg/dL — ABNORMAL HIGH (ref 70–99)
Glucose-Capillary: 154 mg/dL — ABNORMAL HIGH (ref 70–99)

## 2022-02-08 MED ORDER — POTASSIUM CHLORIDE 20 MEQ PO PACK
40.0000 meq | PACK | Freq: Once | ORAL | Status: AC
  Administered 2022-02-08: 40 meq via ORAL
  Filled 2022-02-08: qty 2

## 2022-02-08 MED ORDER — CEFDINIR 300 MG PO CAPS: 300.0000 mg | ORAL_CAPSULE | Freq: Two times a day (BID) | ORAL | 0 refills | Status: AC

## 2022-02-08 MED ORDER — SPIRONOLACTONE 25 MG PO TABS: 25.0000 mg | ORAL_TABLET | Freq: Every day | ORAL | 0 refills | Status: DC

## 2022-02-08 NOTE — Progress Notes (Signed)
Patient slept on and off this shift. He woke up to use the urinal. No complaints of pain this shift. Patient did stated "I can feel a mass in my left breast close to the nipple. I believe it is a cyst" This Probation officer informed patient that AM nurse know would be informed and it will put in a note for morning MD.

## 2022-02-08 NOTE — Progress Notes (Signed)
Pt has discharge orders, discharge teaching given and no further questions at this time. Pt wheeled down to main lobby via w/c by staff to vehicle accompanied by wife.

## 2022-02-08 NOTE — Telephone Encounter (Signed)
  Prescription Request  02/08/2022  What is the name of the medication or equipment? TRADJENTA and LACTULOSE  Have you contacted your pharmacy to request a refill? YES  Which pharmacy would you like this sent to?  [facility]  Pts wife called stating that patient needs these medications refilled ASAP because he is out. Says the hospital initially prescribed it to him but was told that his PCP would give him refills when he needed them.   MMM is on vacation. Can covering provider advise on this.

## 2022-02-08 NOTE — Telephone Encounter (Signed)
LMOVM to call back 

## 2022-02-08 NOTE — Discharge Summary (Signed)
Physician Discharge Summary  [name] ZTI:[id number] DOB: 11-May-1950 DOA: 02/04/2022  PCP: [name], FNP  Admit date: 02/04/2022 Discharge date: 02/08/2022  Time spent: 35 minutes  Recommendations for Outpatient Follow-up:  Gastroenterology 2 weeks PCP Ronnald Collum in 1 week   Discharge Diagnoses:  Principal Problem:   Fever Active Problems:   Decompensated hepatic cirrhosis (HCC)   CKD (chronic kidney disease) stage 3B, GFR 30-59 ml/min (HCC)   Thrombocytopenia (HCC)   Coronary atherosclerosis of native coronary artery   Essential hypertension   Type 2 diabetes with nephropathy ([place])   Sepsis secondary to UTI [facility])   Discharge Condition: Improved  Diet recommendation: Low sodium, diabetic  Filed Weights   02/04/22 1208  Weight: 71.7 kg    History of present illness:  71 y.o.m w/ history significant for liver cirrhosis, diabetes mellitus, CKD 3b creat~1.6-1.9,CAD, hypertension presented to the ED with fever chills abdominal distention decreased urine output, generalized abdominal discomfort.   In the ED was febrile BP stable, labs showed WBC 4.4, LFTS elevated but about baseline.  UA with positive nitrite.Platelets 54.Chest and abdominal x-ray -shows basilar likely subsegmental atelectasis.Abdominal and pelvic CT without contrast-trace bilateral pleural effusions, slight increased size of moderate volume ascites.  -Admitted, started on ceftriaxone Underwent paracentesis 4.5 L removed, ascitic fluid studies with total nucleated cells 111, neutrophil 3 Gram stain no organism culture sent.  Patient continued on antibiotics last fever spike 10/2 8 AM   Hospital Course:   Fever UTI: UA abnormal, urine culture growing 70,000 colonies of GNR, follow-up speciation and sensitivities  -Ascitic fluid negative for SBP, cultures also negative, CT abdomen and chest x-ray unremarkable for infectious process -Treated with IV ceftriaxone and then transitioned to oral  cefdinir for 3 more days -Clinically improving, ambulating with PT, discharged home in a stable condition, advised close follow-up with PCP in 1 week   Decompensated hepatic cirrhosis with ascites, thrombocytopenia, coagulopathy with INR 1.7 edema anasarca and Transaminitis: Continue with lactulose PPI Aldactone midodrine Lasix.  - S/p paracentesis as above last was in 9/19 4.9 L removed. -Educated regarding salt and fluid restriction -Follow-up with gastroenterology   Chronic hypotension -On high-dose midodrine at baseline, continued   Thrombocythemia  -Secondary to chronic liver disease   CKD stage IIIb,creat~1.6-1.9, overall stable at baseline     Type 2 diabetes mellitus A1c well controlled 6.4.  -Linagliptin on hold, CBGs elevated, add low-dose Levemir   Essential hypertension BP well controlled on diuretics as above CAD,no chest pain   Hyponatremia  -Likely from diuretics, improving   Hypokalemia -Repeat Lasix   Anemia likely from -chronic liver disease/CKD -Stable, monitor  Discharge Exam: Vitals:   02/07/22 2118 02/08/22 0537  BP: (!) 118/57 120/73  Pulse: 94 96  Resp: 20 16  Temp: 98.3 F (36.8 C) (!) 97 F (36.1 C)  SpO2: 99% 99%  Gen: Awake, Alert, Oriented X 2, tired appearing HEENT: no JVD Lungs: Good air movement bilaterally, CTAB CVS: S1S2/RRR Abd: soft, Non tender,  distended, BS present, + fluid thrill Extremities: No edema Skin: no new rashes on exposed skin     Discharge Instructions   Discharge Instructions     Diet - low sodium heart healthy   Complete by: As directed    Increase activity slowly   Complete by: As directed       Allergies as of 02/08/2022   No Known Allergies      Medication List     TAKE these medications  albuterol 108 (90 Base) MCG/ACT inhaler Commonly known as: VENTOLIN HFA Inhale 1-2 puffs into the lungs every 6 (six) hours as needed for wheezing or shortness of breath.   brimonidine 0.2 %  ophthalmic solution Commonly known as: ALPHAGAN Place 1 drop into both eyes 2 (two) times daily.   cefdinir 300 MG capsule Commonly known as: OMNICEF Take 1 capsule (300 mg total) by mouth 2 (two) times daily for 3 days.   furosemide 40 MG tablet Commonly known as: Lasix Take 1 tablet (40 mg total) by mouth daily.   lactulose 10 GM/15ML solution Commonly known as: CHRONULAC Take 15 mLs (10 g total) by mouth 2 (two) times daily.   linagliptin 5 MG Tabs tablet Commonly known as: TRADJENTA Take 1 tablet (5 mg total) by mouth daily.   midodrine 10 MG tablet Commonly known as: PROAMATINE Take 2 tablets (20 mg total) by mouth 3 (three) times daily with meals.   pantoprazole 40 MG tablet Commonly known as: PROTONIX Take 1 tablet (40 mg total) by mouth 2 (two) times daily.   potassium chloride 10 MEQ tablet Commonly known as: KLOR-CON M Take 1 tablet (10 mEq total) by mouth daily.   sodium bicarbonate 650 MG tablet Take 1 tablet (650 mg total) by mouth 3 (three) times daily.   spironolactone 25 MG tablet Commonly known as: ALDACTONE Take 1 tablet (25 mg total) by mouth daily. What changed: how much to take   tamsulosin 0.4 MG Caps capsule Commonly known as: FLOMAX Take 1 capsule (0.4 mg total) by mouth daily after supper.   traZODone 50 MG tablet Commonly known as: DESYREL Take 1 tablet (50 mg total) by mouth at bedtime.       No Known Allergies    The results of significant diagnostics from this hospitalization (including imaging, microbiology, ancillary and laboratory) are listed below for reference.    Significant Diagnostic Studies: US Paracentesis  Result Date: 02/05/2022 INDICATION: Cirrhosis, ascites, fever EXAM: ULTRASOUND GUIDED DIAGNOSTIC AND THERAPEUTIC PARACENTESIS MEDICATIONS: None. COMPLICATIONS: None immediate. PROCEDURE: Informed written consent was obtained from the patient after a discussion of the risks, benefits and alternatives to treatment. A  timeout was performed prior to the initiation of the procedure. Initial ultrasound scanning demonstrates a large amount of ascites within the right lower abdominal quadrant. The right lower abdomen was prepped and draped in the usual sterile fashion. 1% lidocaine was used for local anesthesia. Following this, a 19 gauge, 7-cm, Yueh catheter was introduced. An ultrasound image was saved for documentation purposes. The paracentesis was performed. The catheter was removed and a dressing was applied. The patient tolerated the procedure well without immediate post procedural complication. Patient received post-procedure intravenous albumin; see nursing notes for details. FINDINGS: A total of approximately 4.5 L of clear yellow ascitic fluid was removed. Samples were sent to the laboratory as requested by the clinical team. IMPRESSION: Successful ultrasound-guided paracentesis yielding 4.5 liters of peritoneal fluid. Electronically Signed   By: Lavonia Dana M.D.   On: 02/05/2022 09:54   CT ABDOMEN PELVIS WO CONTRAST  Result Date: 02/04/2022 CLINICAL DATA:  Bowel obstruction suspected EXAM: CT ABDOMEN AND PELVIS WITHOUT CONTRAST TECHNIQUE: Multidetector CT imaging of the abdomen and pelvis was performed following the standard protocol without IV contrast. RADIATION DOSE REDUCTION: This exam was performed according to the departmental dose-optimization program which includes automated exposure control, adjustment of the mA and/or kV according to patient size and/or use of iterative reconstruction technique. COMPARISON:  CT abdomen pelvis 11/24/2021  FINDINGS: Lower chest: Trace bilateral pleural effusions. Thickening of the visualized distal esophagus likely due to paraseptal varices. Four-vessel coronary artery calcification. Hepatobiliary: Nodular hepatic contour. Enlarged caudate lobe. No focal liver abnormality. Calcified gallstones noted within the gallbladder lumen. No gallbladder wall thickening or pericholecystic  fluid. No biliary dilatation. Pancreas: No focal lesion. Normal pancreatic contour. No surrounding inflammatory changes. No main pancreatic ductal dilatation. Spleen: The spleen is enlarged in size measuring up to 14 cm. No focal splenic lesion. Adrenals/Urinary Tract: No adrenal nodule bilaterally. No nephrolithiasis and no hydronephrosis. No definite contour-deforming renal mass. No ureterolithiasis or hydroureter. The urinary bladder is unremarkable. Stomach/Bowel: Stomach is within normal limits. No evidence of bowel wall thickening or dilatation. Portal colopathy of the ascending colon. Sigmoid diverticulosis. Appendix appears normal. Vascular/Lymphatic: No abdominal aorta or iliac aneurysm. Mild to moderate atherosclerotic plaque of the aorta and its branches. No abdominal, pelvic, or inguinal lymphadenopathy. Reproductive: Prostate is unremarkable. Other: Interval slight increase in moderate volume free intraperitoneal fluid. Esophageal and paraesophageal venous collaterals. No intraperitoneal free gas. No organized fluid collection. Musculoskeletal: No abdominal wall hernia or abnormality. No suspicious lytic or blastic osseous lesions. No acute displaced fracture. Multilevel degenerative changes of the spine. IMPRESSION: 1. Trace bilateral pleural effusions. 2. Slight interval increase in size of moderate volume ascites. 3. Cirrhosis with portal hypertension. No focal liver lesions identified. Please note that liver protocol enhanced MR and CT are the most sensitive tests for the screening detection of hepatocellular carcinoma in the high risk setting of cirrhosis. 4. Thickening of the visualized distal esophagus likely due to esophageal and paraesophageal varices. Markedly limited evaluation due to noncontrast study. 5. Portal colopathy of the ascending colon. 6. Cholelithiasis with no CT finding of acute cholecystitis. 7. Colonic diverticulosis with no acute diverticulitis. 8. Aortic Atherosclerosis  (ICD10-I70.0) including four-vessel coronary calcification. Electronically Signed   By: Iven Finn M.D.   On: 02/04/2022 16:10   DG Abdomen Acute W/Chest  Result Date: 02/04/2022 CLINICAL DATA:  Cough. Stomach distention. Fever, chills, and fatigue. Decreased urinary output. EXAM: DG ABDOMEN ACUTE WITH 1 VIEW CHEST COMPARISON:  Chest radiographs 01/23/2022 and 12/08/2021. CT abdomen and pelvis 11/24/2021 FINDINGS: Cardiac silhouette and mediastinal contours are within normal limits. Moderately decreased lung volumes, mildly worsened from 01/23/2022 most recent prior chest radiograph. Right-greater-than-left horizontal linear basilar likely subsegmental atelectasis is mildly increased from 01/22/2022. No pleural effusion or pneumothorax. Moderate stool within the ascending and transverse colon. Air is seen within loops of small bowel as well as the ascending, transverse, descending colon, and rectum. On supine view, there is a single loop of vertically oriented bowel overlying the lateral left hemiabdomen that is mildly distended, measuring up to 3.5 cm. Mild-to-moderate multilevel degenerative disc changes of the visualized thoracic and lumbar spine. Mild levocurvature of the upper lumbar spine. No subdiaphragmatic free air on upright view. IMPRESSION: 1. Right-greater-than-left horizontal linear basilar likely subsegmental atelectasis. 2. Moderate stool within the ascending and transverse colon. 3. On supine view, there is a single loop of vertically oriented bowel overlying the lateral left hemiabdomen that is mildly distended. This may represent a focal ileus. Air is seen within the more distal colon. Electronically Signed   By: Yvonne Kendall M.D.   On: 02/04/2022 15:02   US Paracentesis  Result Date: 01/23/2022 INDICATION: Ascites. EXAM: ULTRASOUND GUIDED DIAGNOSTIC AND THERAPEUTIC PARACENTESIS MEDICATIONS: None. COMPLICATIONS: None immediate. PROCEDURE: Informed written consent was obtained from  the patient after a discussion of the risks, benefits and alternatives to  treatment. A timeout was performed prior to the initiation of the procedure. Initial ultrasound scanning demonstrates a large amount of ascites within the right lower abdominal quadrant. The right lower abdomen was prepped and draped in the usual sterile fashion. 1% lidocaine was used for local anesthesia. Following this, a 19 gauge, 7-cm, Yueh catheter was introduced. An ultrasound image was saved for documentation purposes. The paracentesis was performed. The catheter was removed and a dressing was applied. The patient tolerated the procedure well without immediate post procedural complication. FINDINGS: A total of approximately 4.9 L of clear yellow fluid was removed. Samples were sent to the laboratory as requested by the clinical team. IMPRESSION: 1. Successful ultrasound-guided paracentesis yielding 4.9 liters of peritoneal fluid. Electronically Signed   By: Titus Dubin M.D.   On: 01/23/2022 15:29   DG Chest 2 View  Result Date: 01/23/2022 CLINICAL DATA:  Provided history: Shortness of breath. EXAM: CHEST - 2 VIEW COMPARISON:  Prior chest radiographs 12/08/2021 and earlier. FINDINGS: Shallow inspiration radiograph. Heart size within normal limits. Aortic atherosclerosis. Mild ill-defined opacity within the bilateral lung bases, with an appearance favoring atelectasis. No sizable pleural effusion. No evidence of pneumothorax. Degenerative changes of the spine. IMPRESSION: Shallow inspiration radiograph. Mild ill-defined opacity within the bilateral lung bases, with an appearance favoring atelectasis. Aortic Atherosclerosis (ICD10-I70.0). Electronically Signed   By: Kellie Simmering D.O.   On: 01/23/2022 11:46    Microbiology: Recent Results (from the past 240 hour(s))  Urine Culture     Status: Abnormal   Collection Time: 02/04/22 12:16 PM   Specimen: Urine, Clean Catch  Result Value Ref Range Status   Specimen Description    Final    URINE, CLEAN CATCH Performed at [facility], [address], [place], [place] [address]    Special Requests   Final    NONE Performed at [facility], [address], [place], [place] [address]    Culture 70,000 COLONIES/mL ESCHERICHIA COLI (A)  Final   Report Status 02/07/2022 FINAL  Final   Organism ID, Bacteria ESCHERICHIA COLI (A)  Final      Susceptibility   Escherichia coli - MIC*    AMPICILLIN >=32 RESISTANT Resistant     CEFAZOLIN <=4 SENSITIVE Sensitive     CEFEPIME <=0.12 SENSITIVE Sensitive     CEFTRIAXONE <=0.25 SENSITIVE Sensitive     CIPROFLOXACIN >=4 RESISTANT Resistant     GENTAMICIN <=1 SENSITIVE Sensitive     IMIPENEM <=0.25 SENSITIVE Sensitive     NITROFURANTOIN <=16 SENSITIVE Sensitive     TRIMETH/SULFA >=320 RESISTANT Resistant     AMPICILLIN/SULBACTAM 8 SENSITIVE Sensitive     PIP/TAZO <=4 SENSITIVE Sensitive     * 70,000 COLONIES/mL ESCHERICHIA COLI  Blood culture (routine x 2)     Status: None (Preliminary result)   Collection Time: 02/04/22  5:06 PM   Specimen: Site Not Specified; Blood  Result Value Ref Range Status   Specimen Description   Final    SITE NOT SPECIFIED BOTTLES DRAWN AEROBIC AND ANAEROBIC   Special Requests Blood Culture adequate volume  Final   Culture   Final    NO GROWTH 4 DAYS Performed at [facility], [address] [place],  [address]    Report Status PENDING  Incomplete  Blood culture (routine x 2)     Status: None (Preliminary result)   Collection Time: 02/04/22  5:06 PM   Specimen: Site Not Specified; Blood  Result Value Ref Range Status   Specimen Description  Final    SITE NOT SPECIFIED BOTTLES DRAWN AEROBIC AND ANAEROBIC   Special Requests Blood Culture adequate volume  Final   Culture   Final    NO GROWTH 4 DAYS Performed at [facility], [address], [place], [place] [address]    Report Status PENDING  Incomplete  Resp Panel by RT-PCR (Flu A&B, Covid) Anterior Nasal Swab     Status: None    Collection Time: 02/04/22  6:35 PM   Specimen: Anterior Nasal Swab  Result Value Ref Range Status   SARS Coronavirus 2 by RT PCR NEGATIVE NEGATIVE Final    Comment: (NOTE) SARS-CoV-2 target nucleic acids are NOT DETECTED.  The SARS-CoV-2 RNA is generally detectable in upper respiratory specimens during the acute phase of infection. The lowest concentration of SARS-CoV-2 viral copies this assay can detect is 138 copies/mL. A negative result does not preclude SARS-Cov-2 infection and should not be used as the sole basis for treatment or other patient management decisions. A negative result may occur with  improper specimen collection/handling, submission of specimen other than nasopharyngeal swab, presence of viral mutation(s) within the areas targeted by this assay, and inadequate number of viral copies(<138 copies/mL). A negative result must be combined with clinical observations, patient history, and epidemiological information. The expected result is Negative.  Fact Sheet for Patients:  EntrepreneurPulse.com.au  Fact Sheet for Healthcare Providers:  IncredibleEmployment.be  This test is no t yet approved or cleared by the [place] FDA and  has been authorized for detection and/or diagnosis of SARS-CoV-2 by FDA under an Emergency Use Authorization (EUA). This EUA will remain  in effect (meaning this test can be used) for the duration of the COVID-19 declaration under Section 564(b)(1) of the Act, 21 U.S.C.section 360bbb-3(b)(1), unless the authorization is terminated  or revoked sooner.       Influenza A by PCR NEGATIVE NEGATIVE Final   Influenza B by PCR NEGATIVE NEGATIVE Final    Comment: (NOTE) The Xpert Xpress SARS-CoV-2/FLU/RSV plus assay is intended as an aid in the diagnosis of influenza from Nasopharyngeal swab specimens and should not be used as a sole basis for treatment. Nasal washings and aspirates are unacceptable for  Xpert Xpress SARS-CoV-2/FLU/RSV testing.  Fact Sheet for Patients: EntrepreneurPulse.com.au  Fact Sheet for Healthcare Providers: IncredibleEmployment.be  This test is not yet approved or cleared by the [place] FDA and has been authorized for detection and/or diagnosis of SARS-CoV-2 by FDA under an Emergency Use Authorization (EUA). This EUA will remain in effect (meaning this test can be used) for the duration of the COVID-19 declaration under Section 564(b)(1) of the Act, 21 U.S.C. section 360bbb-3(b)(1), unless the authorization is terminated or revoked.  Performed at [facility], [address] [place], [place] [address]   Culture, body fluid w Gram Stain-bottle     Status: None (Preliminary result)   Collection Time: 02/05/22  9:01 AM   Specimen: Peritoneal Washings  Result Value Ref Range Status   Specimen Description PERITONEAL  Final   Special Requests BOTTLES DRAWN AEROBIC AND ANAEROBIC 10CC  Final   Culture   Final    NO GROWTH 3 DAYS Performed at [facility], [address], [place],  [address]    Report Status PENDING  Incomplete  Gram stain     Status: None   Collection Time: 02/05/22  9:01 AM   Specimen: Peritoneal Washings  Result Value Ref Range Status   Specimen Description PERITONEAL  Final   Special Requests NONE  Final   Gram Stain   Final    NO ORGANISMS SEEN WBC PRESENT, PREDOMINANTLY MONONUCLEAR CYTOSPIN SMEAR Performed at [facility], [address], [place], [place] [address]    Report Status 02/05/2022 FINAL  Final     Labs: Basic Metabolic Panel: Recent Labs  Lab 02/04/22 1257 02/05/22 0543 02/06/22 0758 02/07/22 0319 02/08/22 0329  NA 131* 133* 132* 133* 134*  K 3.9 3.7 3.5 3.2* 3.3*  CL 96* 98 94* 94* 96*  CO2 '24 26 28 26 25  '$ GLUCOSE 198* 177* 169* 165* 164*  BUN 28* 29* 27* 29* 32*  CREATININE 2.04* 1.98* 1.72* 1.71* 1.63*  CALCIUM 8.7* 8.4* 8.3* 8.8* 8.5*   Liver Function  Tests: Recent Labs  Lab 02/04/22 1235 02/06/22 0758 02/08/22 0329  AST 56* 60* 54*  ALT 43 43 43  ALKPHOS 73 64 62  BILITOT 5.0* 3.4* 2.2*  PROT 6.4* 5.8* 5.6*  ALBUMIN 3.0* 2.7* 2.6*   Recent Labs  Lab 02/04/22 1235  LIPASE 50   Recent Labs  Lab 02/07/22 1036  AMMONIA 15   CBC: Recent Labs  Lab 02/04/22 1235 02/05/22 0543 02/06/22 0758 02/07/22 0319 02/08/22 0329  WBC 4.4 3.9* 4.3 5.0 5.3  NEUTROABS 3.4  --   --   --   --   HGB 11.2* 10.5* 10.9* 11.6* 10.4*  HCT 32.0* 30.4* 30.6* 32.9* 29.6*  MCV 101.3* 101.7* 100.0 100.0 99.7  PLT 54* 50* 45* 51* 54*   Cardiac Enzymes: No results for input(s): "CKTOTAL", "CKMB", "CKMBINDEX", "TROPONINI" in the last 168 hours. BNP: BNP (last 3 results) No results for input(s): "BNP" in the last 8760 hours.  ProBNP (last 3 results) No results for input(s): "PROBNP" in the last 8760 hours.  CBG: Recent Labs  Lab 02/07/22 0710 02/07/22 1117 02/07/22 1616 02/07/22 2115 02/08/22 0658  GLUCAP 170* 248* 253* 266* 154*       Signed:  Domenic Polite MD.  [facility] 02/08/2022, 11:51 AM

## 2022-02-09 ENCOUNTER — Telehealth: Payer: Self-pay

## 2022-02-09 LAB — CULTURE, BLOOD (ROUTINE X 2)
Culture: NO GROWTH
Culture: NO GROWTH
Special Requests: ADEQUATE
Special Requests: ADEQUATE

## 2022-02-09 MED ORDER — LINAGLIPTIN 5 MG PO TABS: 5.0000 mg | ORAL_TABLET | Freq: Every day | ORAL | 1 refills | Status: AC

## 2022-02-09 MED ORDER — LACTULOSE 10 GM/15ML PO SOLN: 10.0000 g | Freq: Two times a day (BID) | ORAL | 1 refills | Status: AC

## 2022-02-09 NOTE — Telephone Encounter (Signed)
Transition Care Management Unsuccessful Follow-up Telephone Call  Date of discharge and from where:  02/08/2022 [facility]   Attempts:  1st Attempt  Reason for unsuccessful TCM follow-up call:  Left voice message

## 2022-02-09 NOTE — Telephone Encounter (Signed)
Transition Care Management Unsuccessful Follow-up Telephone Call   Date of discharge and from where:  02/08/2022 [name]             Attempts:  2nd Attempt   Reason for unsuccessful TCM follow-up call:  Left voice message

## 2022-02-09 NOTE — Telephone Encounter (Signed)
Closing encounter, addressing in anouther encounter

## 2022-02-10 LAB — CULTURE, BODY FLUID W GRAM STAIN -BOTTLE: Culture: NO GROWTH

## 2022-02-12 NOTE — Telephone Encounter (Signed)
Transition Care Management Unsuccessful Follow-up Telephone Call  Date of discharge and from where:  02/08/2022 [name]  Attempts:  3rd Attempt  Reason for unsuccessful TCM follow-up call:  Left voice message - Letter mailed

## 2022-02-12 NOTE — Telephone Encounter (Signed)
Letter mailed

## 2022-02-13 ENCOUNTER — Other Ambulatory Visit: Payer: Self-pay

## 2022-02-13 ENCOUNTER — Encounter: Payer: Self-pay | Admitting: *Deleted

## 2022-02-13 ENCOUNTER — Encounter (HOSPITAL_COMMUNITY): Payer: Self-pay

## 2022-02-13 ENCOUNTER — Emergency Department (HOSPITAL_COMMUNITY): Payer: 59

## 2022-02-13 ENCOUNTER — Telehealth: Payer: Self-pay | Admitting: *Deleted

## 2022-02-13 ENCOUNTER — Observation Stay (HOSPITAL_COMMUNITY)
Admission: EM | Admit: 2022-02-13 | Discharge: 2022-02-14 | Disposition: A | Discharge status: Home / Self Care | Payer: 59 | Attending: Student | Admitting: Student

## 2022-02-13 ENCOUNTER — Other Ambulatory Visit: Payer: Self-pay | Admitting: *Deleted

## 2022-02-13 DIAGNOSIS — N1832 Chronic kidney disease, stage 3b: Secondary | ICD-10-CM | POA: Insufficient documentation

## 2022-02-13 DIAGNOSIS — D696 Thrombocytopenia, unspecified: Secondary | ICD-10-CM | POA: Insufficient documentation

## 2022-02-13 DIAGNOSIS — D631 Anemia in chronic kidney disease: Secondary | ICD-10-CM | POA: Insufficient documentation

## 2022-02-13 DIAGNOSIS — K7460 Unspecified cirrhosis of liver: Secondary | ICD-10-CM | POA: Diagnosis present

## 2022-02-13 DIAGNOSIS — N183 Chronic kidney disease, stage 3 unspecified: Secondary | ICD-10-CM | POA: Diagnosis present

## 2022-02-13 DIAGNOSIS — D638 Anemia in other chronic diseases classified elsewhere: Secondary | ICD-10-CM

## 2022-02-13 DIAGNOSIS — E1121 Type 2 diabetes mellitus with diabetic nephropathy: Secondary | ICD-10-CM | POA: Insufficient documentation

## 2022-02-13 DIAGNOSIS — E1122 Type 2 diabetes mellitus with diabetic chronic kidney disease: Secondary | ICD-10-CM | POA: Insufficient documentation

## 2022-02-13 DIAGNOSIS — K7031 Alcoholic cirrhosis of liver with ascites: Principal | ICD-10-CM | POA: Insufficient documentation

## 2022-02-13 DIAGNOSIS — R188 Other ascites: Secondary | ICD-10-CM

## 2022-02-13 DIAGNOSIS — I10 Essential (primary) hypertension: Secondary | ICD-10-CM | POA: Diagnosis present

## 2022-02-13 DIAGNOSIS — R0602 Shortness of breath: Secondary | ICD-10-CM | POA: Diagnosis present

## 2022-02-13 DIAGNOSIS — I129 Hypertensive chronic kidney disease with stage 1 through stage 4 chronic kidney disease, or unspecified chronic kidney disease: Secondary | ICD-10-CM | POA: Diagnosis not present

## 2022-02-13 DIAGNOSIS — Z79899 Other long term (current) drug therapy: Secondary | ICD-10-CM | POA: Insufficient documentation

## 2022-02-13 DIAGNOSIS — Z87891 Personal history of nicotine dependence: Secondary | ICD-10-CM | POA: Insufficient documentation

## 2022-02-13 DIAGNOSIS — Z23 Encounter for immunization: Secondary | ICD-10-CM | POA: Diagnosis not present

## 2022-02-13 DIAGNOSIS — K219 Gastro-esophageal reflux disease without esophagitis: Secondary | ICD-10-CM | POA: Diagnosis not present

## 2022-02-13 DIAGNOSIS — N179 Acute kidney failure, unspecified: Secondary | ICD-10-CM | POA: Insufficient documentation

## 2022-02-13 DIAGNOSIS — I2510 Atherosclerotic heart disease of native coronary artery without angina pectoris: Secondary | ICD-10-CM | POA: Insufficient documentation

## 2022-02-13 DIAGNOSIS — R140 Abdominal distension (gaseous): Secondary | ICD-10-CM

## 2022-02-13 LAB — CBC WITH DIFFERENTIAL/PLATELET
Abs Immature Granulocytes: 0.02 10*3/uL (ref 0.00–0.07)
Basophils Absolute: 0 10*3/uL (ref 0.0–0.1)
Basophils Relative: 0 %
Eosinophils Absolute: 0 10*3/uL (ref 0.0–0.5)
Eosinophils Relative: 1 %
HCT: 32 % — ABNORMAL LOW (ref 39.0–52.0)
Hemoglobin: 11.3 g/dL — ABNORMAL LOW (ref 13.0–17.0)
Immature Granulocytes: 0 %
Lymphocytes Relative: 12 %
Lymphs Abs: 0.6 10*3/uL — ABNORMAL LOW (ref 0.7–4.0)
MCH: 35.4 pg — ABNORMAL HIGH (ref 26.0–34.0)
MCHC: 35.3 g/dL (ref 30.0–36.0)
MCV: 100.3 fL — ABNORMAL HIGH (ref 80.0–100.0)
Monocytes Absolute: 0.6 10*3/uL (ref 0.1–1.0)
Monocytes Relative: 12 %
Neutro Abs: 3.7 10*3/uL (ref 1.7–7.7)
Neutrophils Relative %: 75 %
Platelets: 74 10*3/uL — ABNORMAL LOW (ref 150–400)
RBC: 3.19 MIL/uL — ABNORMAL LOW (ref 4.22–5.81)
RDW: 14.2 % (ref 11.5–15.5)
WBC: 5 10*3/uL (ref 4.0–10.5)
nRBC: 0 % (ref 0.0–0.2)

## 2022-02-13 LAB — COMPREHENSIVE METABOLIC PANEL
ALT: 75 U/L — ABNORMAL HIGH (ref 0–44)
AST: 84 U/L — ABNORMAL HIGH (ref 15–41)
Albumin: 3 g/dL — ABNORMAL LOW (ref 3.5–5.0)
Alkaline Phosphatase: 90 U/L (ref 38–126)
Anion gap: 12 (ref 5–15)
BUN: 30 mg/dL — ABNORMAL HIGH (ref 8–23)
CO2: 26 mmol/L (ref 22–32)
Calcium: 8.6 mg/dL — ABNORMAL LOW (ref 8.9–10.3)
Chloride: 91 mmol/L — ABNORMAL LOW (ref 98–111)
Creatinine, Ser: 2.1 mg/dL — ABNORMAL HIGH (ref 0.61–1.24)
GFR, Estimated: 33 mL/min — ABNORMAL LOW (ref >60)
Glucose, Bld: 333 mg/dL — ABNORMAL HIGH (ref 70–99)
Potassium: 4 mmol/L (ref 3.5–5.1)
Sodium: 129 mmol/L — ABNORMAL LOW (ref 135–145)
Total Bilirubin: 3.9 mg/dL — ABNORMAL HIGH (ref 0.3–1.2)
Total Protein: 6.3 g/dL — ABNORMAL LOW (ref 6.5–8.1)

## 2022-02-13 LAB — GLUCOSE, CAPILLARY: Glucose-Capillary: 220 mg/dL — ABNORMAL HIGH (ref 70–99)

## 2022-02-13 LAB — LIPASE, BLOOD: Lipase: 81 U/L — ABNORMAL HIGH (ref 11–51)

## 2022-02-13 LAB — PROTIME-INR
INR: 1.5 — ABNORMAL HIGH (ref 0.8–1.2)
Prothrombin Time: 17.8 seconds — ABNORMAL HIGH (ref 11.4–15.2)

## 2022-02-13 LAB — CBG MONITORING, ED: Glucose-Capillary: 262 mg/dL — ABNORMAL HIGH (ref 70–99)

## 2022-02-13 MED ORDER — SODIUM CHLORIDE 0.9 % IV SOLN: 250.0000 mL | INTRAVENOUS | Status: DC | PRN

## 2022-02-13 MED ORDER — TAMSULOSIN HCL 0.4 MG PO CAPS
0.4000 mg | ORAL_CAPSULE | Freq: Every day | ORAL | Status: DC
  Administered 2022-02-13: 0.4 mg via ORAL
  Filled 2022-02-13: qty 1

## 2022-02-13 MED ORDER — ACETAMINOPHEN 650 MG RE SUPP: 650.0000 mg | Freq: Four times a day (QID) | RECTAL | Status: DC | PRN

## 2022-02-13 MED ORDER — PANTOPRAZOLE SODIUM 40 MG PO TBEC
40.0000 mg | DELAYED_RELEASE_TABLET | Freq: Two times a day (BID) | ORAL | Status: DC
  Administered 2022-02-13 – 2022-02-14 (×2): 40 mg via ORAL
  Filled 2022-02-13 (×2): qty 1

## 2022-02-13 MED ORDER — SODIUM BICARBONATE 650 MG PO TABS
650.0000 mg | ORAL_TABLET | Freq: Three times a day (TID) | ORAL | Status: DC
  Administered 2022-02-13 – 2022-02-14 (×2): 650 mg via ORAL
  Filled 2022-02-13 (×2): qty 1

## 2022-02-13 MED ORDER — MIDODRINE HCL 5 MG PO TABS
20.0000 mg | ORAL_TABLET | Freq: Three times a day (TID) | ORAL | Status: DC
  Administered 2022-02-14 (×2): 20 mg via ORAL
  Filled 2022-02-13 (×2): qty 4

## 2022-02-13 MED ORDER — INSULIN ASPART 100 UNIT/ML IJ SOLN
0.0000 [IU] | INTRAMUSCULAR | Status: DC
  Administered 2022-02-13: 3 [IU] via SUBCUTANEOUS
  Administered 2022-02-14: 2 [IU] via SUBCUTANEOUS
  Administered 2022-02-14: 1 [IU] via SUBCUTANEOUS

## 2022-02-13 MED ORDER — ONDANSETRON HCL 4 MG PO TABS: 4.0000 mg | ORAL_TABLET | Freq: Four times a day (QID) | ORAL | Status: DC | PRN

## 2022-02-13 MED ORDER — BRIMONIDINE TARTRATE 0.2 % OP SOLN
1.0000 [drp] | Freq: Two times a day (BID) | OPHTHALMIC | Status: DC
  Administered 2022-02-13 – 2022-02-14 (×2): 1 [drp] via OPHTHALMIC
  Filled 2022-02-13 (×2): qty 5

## 2022-02-13 MED ORDER — ALBUMIN HUMAN 25 % IV SOLN: 25.0000 g | Freq: Once | INTRAVENOUS | Status: DC

## 2022-02-13 MED ORDER — ONDANSETRON HCL 4 MG/2ML IJ SOLN: 4.0000 mg | Freq: Four times a day (QID) | INTRAMUSCULAR | Status: DC | PRN

## 2022-02-13 MED ORDER — ACETAMINOPHEN 325 MG PO TABS: 650.0000 mg | ORAL_TABLET | Freq: Four times a day (QID) | ORAL | Status: DC | PRN

## 2022-02-13 MED ORDER — INSULIN GLARGINE-YFGN 100 UNIT/ML ~~LOC~~ SOLN
10.0000 [IU] | Freq: Every day | SUBCUTANEOUS | Status: DC
  Administered 2022-02-14: 10 [IU] via SUBCUTANEOUS
  Filled 2022-02-13 (×2): qty 0.1

## 2022-02-13 MED ORDER — SODIUM CHLORIDE 0.9% FLUSH: 3.0000 mL | INTRAVENOUS | Status: DC | PRN

## 2022-02-13 MED ORDER — LACTULOSE 10 GM/15ML PO SOLN
10.0000 g | Freq: Two times a day (BID) | ORAL | Status: DC
  Administered 2022-02-13 – 2022-02-14 (×2): 10 g via ORAL
  Filled 2022-02-13 (×2): qty 30

## 2022-02-13 MED ORDER — INFLUENZA VAC A&B SA ADJ QUAD 0.5 ML IM PRSY
0.5000 mL | PREFILLED_SYRINGE | INTRAMUSCULAR | Status: AC
  Administered 2022-02-14: 0.5 mL via INTRAMUSCULAR
  Filled 2022-02-13: qty 0.5

## 2022-02-13 MED ORDER — SODIUM CHLORIDE 0.9% FLUSH
3.0000 mL | Freq: Two times a day (BID) | INTRAVENOUS | Status: DC
  Administered 2022-02-13 – 2022-02-14 (×2): 3 mL via INTRAVENOUS

## 2022-02-13 NOTE — Telephone Encounter (Signed)
Pt has been scheduled for 02/16/22 at 10 am, arrive at 9:45 am. Instructed no diuretics day of para. Verbalized understanding.

## 2022-02-13 NOTE — ED Provider Notes (Signed)
[facility] EMERGENCY DEPARTMENT Provider Note   CSN: [id number] Arrival date & time: 02/13/22  1318     History  Chief Complaint  Patient presents with   Shortness of Breath    Edward [name] is a 71 y.o. male with a history of decompensated hepatic cirrhosis presents to the ED complaining of increased shortness of breath and swelling of his abdomen.  This is a recurring problem for the patient and has required multiple paracentesis.  His last paracentesis was last week, he states they removed 4 bottles of fluid.  He was recently admitted to the hospital on 10/1 for febrile illness, diagnosed with sepsis secondary to UTI.  Patient completed his oral course of cefdinir and denies any dysuria symptoms today.  Denies chest pain, cough, fever, chills, nausea, vomiting.  He is compliant with lactulose at home, but has not had his dose today.  He is scheduled for outpatient paracentesis for 10/13, but came to ED due to worsening shortness of breath and feeling like he would not be able to wait.     Home Medications Prior to Admission medications   Medication Sig Start Date End Date Taking? Authorizing Provider  albuterol (VENTOLIN HFA) 108 (90 Base) MCG/ACT inhaler Inhale 1-2 puffs into the lungs every 6 (six) hours as needed for wheezing or shortness of breath. 01/23/22  Yes Triplett, Tammy, PA-C  brimonidine (ALPHAGAN) 0.2 % ophthalmic solution Place 1 drop into both eyes 2 (two) times daily. 01/09/22  Yes [provider]  furosemide (LASIX) 40 MG tablet Take 1 tablet (40 mg total) by mouth daily. 02/05/22 02/05/23 Yes Martin, Mary-Margaret, FNP  lactulose (CHRONULAC) 10 GM/15ML solution Take 15 mLs (10 g total) by mouth 2 (two) times daily. 02/09/22  Yes Hawks, Christy A, FNP  linagliptin (TRADJENTA) 5 MG TABS tablet Take 1 tablet (5 mg total) by mouth daily. 02/09/22  Yes Hawks, Christy A, FNP  midodrine (PROAMATINE) 10 MG tablet Take 2 tablets (20 mg total) by mouth 3 (three) times  daily with meals. 01/12/22  Yes Martin, Mary-Margaret, FNP  pantoprazole (PROTONIX) 40 MG tablet Take 1 tablet (40 mg total) by mouth 2 (two) times daily. 02/01/22 02/01/23 Yes Carver, Elon Alas, DO  potassium chloride (KLOR-CON) 10 MEQ tablet Take 10 mEq by mouth daily. 02/05/22  Yes [provider]  sodium bicarbonate 650 MG tablet Take 1 tablet (650 mg total) by mouth 3 (three) times daily. 11/22/21  Yes Barton Dubois, MD  spironolactone (ALDACTONE) 25 MG tablet Take 1 tablet (25 mg total) by mouth daily. 02/08/22  Yes Domenic Polite, MD  tamsulosin (FLOMAX) 0.4 MG CAPS capsule Take 1 capsule (0.4 mg total) by mouth daily after supper. 02/05/22  Yes Martin, Mary-Margaret, FNP  traZODone (DESYREL) 50 MG tablet Take 1 tablet (50 mg total) by mouth at bedtime. 02/05/22  Yes Hassell Done Mary-Margaret, FNP      Allergies    Patient has no known allergies.    Review of Systems   Review of Systems  Constitutional:  Negative for chills, diaphoresis and fever.  Respiratory:  Positive for shortness of breath. Negative for chest tightness.   Cardiovascular:  Negative for chest pain and leg swelling.  Gastrointestinal:  Positive for abdominal distention. Negative for abdominal pain, constipation, diarrhea, nausea and vomiting.  Genitourinary:  Negative for dysuria and flank pain.    Physical Exam Updated Vital Signs BP 120/69   Pulse 80   Temp 98 F (36.7 C) (Oral)   Resp 18  Ht '5\' 10"'$  (1.778 m)   Wt 71.7 kg   SpO2 100%   BMI 22.67 kg/m  Physical Exam Vitals and nursing note reviewed.  Constitutional:      General: He is not in acute distress.    Appearance: He is not ill-appearing or toxic-appearing.  HENT:     Mouth/Throat:     Mouth: Mucous membranes are moist.     Pharynx: Oropharynx is clear.  Eyes:     General: No scleral icterus. Cardiovascular:     Rate and Rhythm: Normal rate and regular rhythm.     Pulses: Normal pulses.     Heart sounds: Normal heart sounds.   Pulmonary:     Effort: Pulmonary effort is normal. No tachypnea or respiratory distress.     Breath sounds: Normal breath sounds and air entry. No decreased air movement. No decreased breath sounds, wheezing, rhonchi or rales.  Abdominal:     General: Bowel sounds are normal. There is distension.     Palpations: Abdomen is rigid.     Tenderness: There is generalized abdominal tenderness.  Musculoskeletal:     Right lower leg: No edema.     Left lower leg: No edema.  Skin:    General: Skin is warm and dry.     Capillary Refill: Capillary refill takes less than 2 seconds.     Coloration: Skin is not jaundiced.  Neurological:     Mental Status: He is alert. Mental status is at baseline.  Psychiatric:        Mood and Affect: Mood normal.        Behavior: Behavior normal.     ED Results / Procedures / Treatments   Labs (all labs ordered are listed, but only abnormal results are displayed) Labs Reviewed  COMPREHENSIVE METABOLIC PANEL - Abnormal; Notable for the following components:      Result Value   Sodium 129 (*)    Chloride 91 (*)    Glucose, Bld 333 (*)    BUN 30 (*)    Creatinine, Ser 2.10 (*)    Calcium 8.6 (*)    Total Protein 6.3 (*)    Albumin 3.0 (*)    AST 84 (*)    ALT 75 (*)    Total Bilirubin 3.9 (*)    GFR, Estimated 33 (*)    All other components within normal limits  CBC WITH DIFFERENTIAL/PLATELET - Abnormal; Notable for the following components:   RBC 3.19 (*)    Hemoglobin 11.3 (*)    HCT 32.0 (*)    MCV 100.3 (*)    MCH 35.4 (*)    Platelets 74 (*)    Lymphs Abs 0.6 (*)    All other components within normal limits  LIPASE, BLOOD - Abnormal; Notable for the following components:   Lipase 81 (*)    All other components within normal limits  PROTIME-INR - Abnormal; Notable for the following components:   Prothrombin Time 17.8 (*)    INR 1.5 (*)    All other components within normal limits  CBG MONITORING, ED - Abnormal; Notable for the following  components:   Glucose-Capillary 262 (*)    All other components within normal limits  URINALYSIS, ROUTINE W REFLEX MICROSCOPIC    EKG EKG Interpretation  Date/Time:  Tuesday February 13 2022 13:48:47 EDT Ventricular Rate:  95 PR Interval:  176 QRS Duration: 74 QT Interval:  400 QTC Calculation: 502 R Axis:   23 Text Interpretation: Normal sinus rhythm  Septal infarct , age undetermined Prolonged QT Abnormal ECG When compared with ECG of 04-Feb-2022 12:28, PREVIOUS ECG IS PRESENT No acute changes No significant change since last tracing Confirmed by Varney Biles [phone]) on 02/13/2022 6:31:34 PM  Radiology DG Abdomen Acute W/Chest  Result Date: 02/13/2022 CLINICAL DATA:  Short of breath, abdominal pain. History of cirrhosis EXAM: DG ABDOMEN ACUTE WITH 1 VIEW CHEST COMPARISON:  Chest and abdomen 02/04/2022 FINDINGS: Heart size and vascularity normal. Mild bibasilar atelectasis. No pleural effusion Normal bowel gas pattern. No bowel obstruction or free air. Negative for urinary tract calculi. IMPRESSION: 1. Mild bibasilar atelectasis. 2. Normal bowel gas pattern. Electronically Signed   By: Franchot Gallo M.D.   On: 02/13/2022 16:21    Procedures Procedures    Medications Ordered in ED Medications - No data to display  ED Course/ Medical Decision Making/ A&P                           Medical Decision Making  Patient presents to ED with concerns of shortness of breath and abdominal distention.  He has history of decompensated hepatic cirrhosis with frequent need of paracentesis, often presents to ED for this.  He had paracentesis last week.  Patient contacted gastroenterology and scheduled paracentesis for 10/13, however came to ED because he feels he cannot wait that long due to worsening symptoms.   Was recently hospitalized and discharged 10/1 for febrile illness and was diagnosed with UTI.  Patient completed oral course of cefdinir.  He denies any urinary symptoms today.  Upon  physical examination, patient is not in acute respiratory distress.  All lung fields are clear on auscultation, SPO2 100%.  I do not feel an emergent paracentesis is necessary at this time.  Patient appears to be uncomfortable, but stable.  He does not have any chest pain.  His vital signs are also reassuring at this time.  Abdomen is distended and firm with diffuse, mild tenderness.  Patient describes abdomen as "sore".  Physical exam findings consistent with ascites.    I obtained additional history from review of patient's EMR and from family members at bedside.  I ordered and reviewed laboratory work-up. CBC shows macrocytic anemia and thrombocytopenia.  Platelets and hemoglobin trending upward since 5 days ago. Metabolic panel shows hyponatremia, elevated glucose 333, creatinine 2.10, hypocalcemia, hypoalbuminemia.  Mildly elevated liver enzymes and elevated bilirubin.  Repeat POC CBG shows glucose 262. Lipase 81. Prothrombin 17.8, INR 1.5.  ECG ordered and shows normal sinus rhythm with prolonged QT.  No acute abnormality or significant changes when compared with last ECG. Ordered x-ray abdomen acute with chest which shows mild bibasilar atelectasis, no pleural effusion.  I agree with radiologist interpretation. Patient is Child-Pugh class C after calculation. US paracentesis was ordered.  Was informed at time of ordering paracentesis, [place] had departed for the day.  Plan to admit patient for observation and have paracentesis completed tomorrow.   Discussed HPI, physical exam, and plan of care with attending and Ankit Nanavati.  The attending physician evaluated this patient as part of a shared visit and agrees with plan.   Consulted hospitalist team, Dr. Darrick Meigs recommended placing patient in observation.  Plan to admit patient with paracentesis tomorrow.  Patient and family at bedside agreed with plan of care.        Final Clinical Impression(s) / ED Diagnoses Final diagnoses:  None     Rx / DC Orders ED Discharge Orders  None         Pat Kocher, [place] 02/13/22 2148    Varney Biles, MD 02/14/22 0008

## 2022-02-13 NOTE — Telephone Encounter (Signed)
Pt's wife called and stated that pt needed to have fluid drawn off his stomach.  Please advise. Thank you

## 2022-02-13 NOTE — Telephone Encounter (Signed)
Pt has been scheduled for 03/12/22 at 8:45 am for repeat EGD. Instructions and pre-op date will be mailed to pt.

## 2022-02-13 NOTE — ED Provider Triage Note (Signed)
Emergency Medicine Provider Triage Evaluation Note  Edward [name] [facility] , a 71 y.o. male with past medical history significant for type 2 diabetes, hypertension, CKD, anemia and decompensated hepatic cirrhosis and was evaluated in triage.  Pt complains of increasing shortness of breath and swelling of his abdomen.  This is a reoccurring problem for him and has required multiple paracentesis over the last several months.  He was admitted to this hospital on 1 October for febrile illness, diagnosed with sepsis secondary to UTI.  Completed oral course of cefdinir.  Denies any dysuria symptoms today.  No chest pain or cough.  No fever or chills.  Contacted [facility] office today and was set up for outpatient scheduled paracentesis for Friday, but came here today because he felt he could not wait until Friday to have the fluid drawn from his abdomen.  Review of Systems  Positive: Shortness of breath, abdominal distention Negative: Chest pain, fever, chills, dysuria  Physical Exam  BP 127/67 (BP Location: Right Arm)   Pulse 100   Temp 98 F (36.7 C) (Oral)   Resp 18   Ht '5\' 10"'$  (1.778 m)   Wt 71.7 kg   SpO2 99%   BMI 22.67 kg/m  Gen:   Awake, no distress   Resp:  Normal effort  MSK:   Moves extremities without difficulty  Other:  Abdomen distended, nontender  Medical Decision Making  Medically screening exam initiated at 2:33 PM.  Appropriate orders placed.  Edward [name] [facility] was informed that the remainder of the evaluation will be completed by another provider, this initial triage assessment does not replace that evaluation, and the importance of remaining in the ED until their evaluation is complete.     Kem Parkinson, PA-C 02/13/22 1439

## 2022-02-13 NOTE — Telephone Encounter (Signed)
Noted. He can have a para. Limit 4 liters. Recommend Albumin 25 g IV at start of para. No diuretics day of para. Needs cell count, culture, cytology.   Need sooner follow-up than November. Needs to be seen in next 2 weeks.

## 2022-02-13 NOTE — ED Triage Notes (Signed)
Pt presents to ED with complaints of increased SOB started yesterday. Pt abdomen is distended. Pt states he needs another paracentesis.

## 2022-02-13 NOTE — H&P (Signed)
TRH H&P    Patient Demographics:    Arin Peral, is a 70 y.o. male  MRN: [id number]  DOB - [birth year]  Admit Date - 02/13/2022  Referring MD/NP/PA: Dr. Kathrynn Humble  Outpatient Primary MD for the patient is [name], FNP  Patient coming from: Home  Chief complaint-shortness of breath, abdominal distention   HPI:    Moritz Lever  is a 71 y.o. male, with medical history of liver cirrhosis, diabetes mellitus type 2, CKD stage IIIb with baseline creatinine 1.6-1.9, CAD, hypertension who was recently discharged from the hospital after he was admitted for decompensated liver cirrhosis with ascites.  Patient says that over the past few days he has noted worsening of abdominal swelling and became short of breath.  He was supposed to get paracentesis as outpatient on 02/16/2022 however his symptoms became worse so he came to the ED for further evaluation. He complains of shortness of breath, denies chest pain Denies nausea vomiting or diarrhea Denies abdominal pain or dysuria      Review of systems:    In addition to the HPI above,    All other systems reviewed and are negative.    Past History of the following :    Past Medical History:  Diagnosis Date   Cataract    CKD (chronic kidney disease) stage 3, GFR 30-59 ml/min (HCC)    Coronary atherosclerosis of native coronary artery    BMS LAD 2006; residual 75% distal RCA; EF 50%   Essential hypertension    Glaucoma    Low back pain    MI (myocardial infarction) ([place])    Anterior 2006   Mixed hyperlipidemia    Renal insufficiency    Type 2 diabetes mellitus ([place])       Past Surgical History:  Procedure Laterality Date   CATARACT EXTRACTION, BILATERAL     CORONARY ANGIOPLASTY WITH STENT PLACEMENT  2006   ESOPHAGEAL BANDING  02/01/2022   Procedure: ESOPHAGEAL BANDING;  Surgeon: Eloise Harman, DO;  Location: [facility];  Service:  Endoscopy;;   ESOPHAGOGASTRODUODENOSCOPY (EGD) WITH PROPOFOL N/A 02/01/2022   Procedure: ESOPHAGOGASTRODUODENOSCOPY (EGD) WITH PROPOFOL;  Surgeon: Eloise Harman, DO;  Location: [facility];  Service: Endoscopy;  Laterality: N/A;  12:30 am      Social History:      Social History   Tobacco Use   Smoking status: Former    Packs/day: 2.00    Years: 20.00    Total pack years: 40.00    Types: Cigarettes    Quit date: 05/08/1987    Years since quitting: 34.7    Passive exposure: Past   Smokeless tobacco: Never  Substance Use Topics   Alcohol use: No    Alcohol/week: 0.0 standard drinks of alcohol       Family History :     Family History  Problem Relation Age of Onset   Aneurysm Father        Brain aneurysm   Lung cancer Mother        Small cell carcinoma of  lung,kidney and heart   Heart disease Mother    Cancer Brother        lungs   Diabetes Sister    Diabetes Sister    Diabetes Brother    Diabetes Brother       Home Medications:   Prior to Admission medications   Medication Sig Start Date End Date Taking? Authorizing Provider  albuterol (VENTOLIN HFA) 108 (90 Base) MCG/ACT inhaler Inhale 1-2 puffs into the lungs every 6 (six) hours as needed for wheezing or shortness of breath. 01/23/22  Yes Triplett, Tammy, PA-C  brimonidine (ALPHAGAN) 0.2 % ophthalmic solution Place 1 drop into both eyes 2 (two) times daily. 01/09/22  Yes [provider]  furosemide (LASIX) 40 MG tablet Take 1 tablet (40 mg total) by mouth daily. 02/05/22 02/05/23 Yes Martin, Mary-Margaret, FNP  lactulose (CHRONULAC) 10 GM/15ML solution Take 15 mLs (10 g total) by mouth 2 (two) times daily. 02/09/22  Yes Hawks, Christy A, FNP  linagliptin (TRADJENTA) 5 MG TABS tablet Take 1 tablet (5 mg total) by mouth daily. 02/09/22  Yes Hawks, Christy A, FNP  midodrine (PROAMATINE) 10 MG tablet Take 2 tablets (20 mg total) by mouth 3 (three) times daily with meals. 01/12/22  Yes Martin, Mary-Margaret, FNP   pantoprazole (PROTONIX) 40 MG tablet Take 1 tablet (40 mg total) by mouth 2 (two) times daily. 02/01/22 02/01/23 Yes Carver, Elon Alas, DO  potassium chloride (KLOR-CON) 10 MEQ tablet Take 10 mEq by mouth daily. 02/05/22  Yes [provider]  sodium bicarbonate 650 MG tablet Take 1 tablet (650 mg total) by mouth 3 (three) times daily. 11/22/21  Yes Barton Dubois, MD  spironolactone (ALDACTONE) 25 MG tablet Take 1 tablet (25 mg total) by mouth daily. 02/08/22  Yes Domenic Polite, MD  tamsulosin (FLOMAX) 0.4 MG CAPS capsule Take 1 capsule (0.4 mg total) by mouth daily after supper. 02/05/22  Yes Martin, Mary-Margaret, FNP  traZODone (DESYREL) 50 MG tablet Take 1 tablet (50 mg total) by mouth at bedtime. 02/05/22  Yes Martin, Mary-Margaret, FNP     Allergies:    No Known Allergies   Physical Exam:   Vitals  Blood pressure 120/69, pulse 80, temperature 98 F (36.7 C), temperature source Oral, resp. rate 18, height '5\' 10"'$  (1.778 m), weight 71.7 kg, SpO2 100 %.  1.  General: Appears in no acute distress  2. Psychiatric: Alert, oriented x3, intact insight and judgment  3. Neurologic: Cranial nerves II through XII grossly intact, no focal deficit noted  4. HEENMT:  Atraumatic normocephalic, extraocular muscles are intact  5. Respiratory : Clear to auscultation bilaterally  6. Cardiovascular : S1-S2, regular, no murmur auscultated  7. Gastrointestinal:  Abdomen is soft, distended, no organomegaly  8. Skin:  No rashes noted  9.Musculoskeletal:  Trace edema in the lower extremities    Data Review:    CBC Recent Labs  Lab 02/07/22 0319 02/08/22 0329 02/13/22 1450  WBC 5.0 5.3 5.0  HGB 11.6* 10.4* 11.3*  HCT 32.9* 29.6* 32.0*  PLT 51* 54* 74*  MCV 100.0 99.7 100.3*  MCH 35.3* 35.0* 35.4*  MCHC 35.3 35.1 35.3  RDW 14.4 14.4 14.2  LYMPHSABS  --   --  0.6*  MONOABS  --   --  0.6  EOSABS  --   --  0.0  BASOSABS  --   --  0.0    ------------------------------------------------------------------------------------------------------------------  Results for orders placed or performed during the hospital encounter of 02/13/22 (from the past 48  hour(s))  Comprehensive metabolic panel     Status: Abnormal   Collection Time: 02/13/22  2:50 PM  Result Value Ref Range   Sodium 129 (L) 135 - 145 mmol/L   Potassium 4.0 3.5 - 5.1 mmol/L   Chloride 91 (L) 98 - 111 mmol/L   CO2 26 22 - 32 mmol/L   Glucose, Bld 333 (H) 70 - 99 mg/dL    Comment: Glucose reference range applies only to samples taken after fasting for at least 8 hours.   BUN 30 (H) 8 - 23 mg/dL   Creatinine, Ser 2.10 (H) 0.61 - 1.24 mg/dL   Calcium 8.6 (L) 8.9 - 10.3 mg/dL   Total Protein 6.3 (L) 6.5 - 8.1 g/dL   Albumin 3.0 (L) 3.5 - 5.0 g/dL   AST 84 (H) 15 - 41 U/L   ALT 75 (H) 0 - 44 U/L   Alkaline Phosphatase 90 38 - 126 U/L   Total Bilirubin 3.9 (H) 0.3 - 1.2 mg/dL   GFR, Estimated 33 (L) >60 mL/min    Comment: (NOTE) Calculated using the CKD-EPI Creatinine Equation (2021)    Anion gap 12 5 - 15    Comment: Performed at [facility], [address] [place], [place] [address]  CBC with Differential     Status: Abnormal   Collection Time: 02/13/22  2:50 PM  Result Value Ref Range   WBC 5.0 4.0 - 10.5 K/uL   RBC 3.19 (L) 4.22 - 5.81 MIL/uL   Hemoglobin 11.3 (L) 13.0 - 17.0 g/dL   HCT 32.0 (L) 39.0 - 52.0 %   MCV 100.3 (H) 80.0 - 100.0 fL   MCH 35.4 (H) 26.0 - 34.0 pg   MCHC 35.3 30.0 - 36.0 g/dL   RDW 14.2 11.5 - 15.5 %   Platelets 74 (L) 150 - 400 K/uL    Comment: Immature Platelet Fraction may be clinically indicated, consider ordering this additional test [id number]    nRBC 0.0 0.0 - 0.2 %   Neutrophils Relative % 75 %   Neutro Abs 3.7 1.7 - 7.7 K/uL   Lymphocytes Relative 12 %   Lymphs Abs 0.6 (L) 0.7 - 4.0 K/uL   Monocytes Relative 12 %   Monocytes Absolute 0.6 0.1 - 1.0 K/uL   Eosinophils Relative 1 %   Eosinophils Absolute 0.0  0.0 - 0.5 K/uL   Basophils Relative 0 %   Basophils Absolute 0.0 0.0 - 0.1 K/uL   Immature Granulocytes 0 %   Abs Immature Granulocytes 0.02 0.00 - 0.07 K/uL    Comment: Performed at [facility], [address] [place], [place] [address]  Lipase, blood     Status: Abnormal   Collection Time: 02/13/22  2:50 PM  Result Value Ref Range   Lipase 81 (H) 11 - 51 U/L    Comment: Performed at [facility], [address], [place], [place] [address]  Protime-INR     Status: Abnormal   Collection Time: 02/13/22  7:20 PM  Result Value Ref Range   Prothrombin Time 17.8 (H) 11.4 - 15.2 seconds   INR 1.5 (H) 0.8 - 1.2    Comment: (NOTE) INR goal varies based on device and disease states. Performed at [facility], [address], [place], [place] [address]   POC CBG, ED     Status: Abnormal   Collection Time: 02/13/22  7:53 PM  Result Value Ref Range   Glucose-Capillary 262 (H) 70 - 99 mg/dL    Comment: Glucose reference  range applies only to samples taken after fasting for at least 8 hours.    Chemistries  Recent Labs  Lab 02/07/22 0319 02/08/22 0329 02/13/22 1450  NA 133* 134* 129*  K 3.2* 3.3* 4.0  CL 94* 96* 91*  CO2 '26 25 26  '$ GLUCOSE 165* 164* 333*  BUN 29* 32* 30*  CREATININE 1.71* 1.63* 2.10*  CALCIUM 8.8* 8.5* 8.6*  AST  --  54* 84*  ALT  --  43 75*  ALKPHOS  --  62 90  BILITOT  --  2.2* 3.9*   ------------------------------------------------------------------------------------------------------------------  ------------------------------------------------------------------------------------------------------------------ GFR: Estimated Creatinine Clearance: 33.7 mL/min (A) (by C-G formula based on SCr of 2.1 mg/dL (H)). Liver Function Tests: Recent Labs  Lab 02/08/22 0329 02/13/22 1450  AST 54* 84*  ALT 43 75*  ALKPHOS 62 90  BILITOT 2.2* 3.9*  PROT 5.6* 6.3*  ALBUMIN 2.6* 3.0*   Recent Labs  Lab 02/13/22 1450  LIPASE 81*   Recent Labs  Lab 02/07/22 1036   AMMONIA 15   Coagulation Profile: Recent Labs  Lab 02/13/22 1920  INR 1.5*   Cardiac Enzymes: No results for input(s): "CKTOTAL", "CKMB", "CKMBINDEX", "TROPONINI" in the last 168 hours. BNP (last 3 results) No results for input(s): "PROBNP" in the last 8760 hours. HbA1C: No results for input(s): "HGBA1C" in the last 72 hours. CBG: Recent Labs  Lab 02/07/22 1117 02/07/22 1616 02/07/22 2115 02/08/22 0658 02/13/22 1953  GLUCAP 248* 253* 266* 154* 262*   Lipid Profile: No results for input(s): "CHOL", "HDL", "LDLCALC", "TRIG", "CHOLHDL", "LDLDIRECT" in the last 72 hours. Thyroid Function Tests: No results for input(s): "TSH", "T4TOTAL", "FREET4", "T3FREE", "THYROIDAB" in the last 72 hours. Anemia Panel: No results for input(s): "VITAMINB12", "FOLATE", "FERRITIN", "TIBC", "IRON", "RETICCTPCT" in the last 72 hours.  --------------------------------------------------------------------------------------------------------------- Urine analysis:    Component Value Date/Time   COLORURINE YELLOW 02/04/2022 1216   APPEARANCEUR CLEAR 02/04/2022 1216   LABSPEC 1.009 02/04/2022 1216   PHURINE 5.0 02/04/2022 1216   GLUCOSEU NEGATIVE 02/04/2022 1216   HGBUR SMALL (A) 02/04/2022 1216   BILIRUBINUR NEGATIVE 02/04/2022 1216   KETONESUR NEGATIVE 02/04/2022 1216   PROTEINUR NEGATIVE 02/04/2022 1216   NITRITE POSITIVE (A) 02/04/2022 1216   LEUKOCYTESUR NEGATIVE 02/04/2022 1216      Imaging Results:    DG Abdomen Acute W/Chest  Result Date: 02/13/2022 CLINICAL DATA:  Short of breath, abdominal pain. History of cirrhosis EXAM: DG ABDOMEN ACUTE WITH 1 VIEW CHEST COMPARISON:  Chest and abdomen 02/04/2022 FINDINGS: Heart size and vascularity normal. Mild bibasilar atelectasis. No pleural effusion Normal bowel gas pattern. No bowel obstruction or free air. Negative for urinary tract calculi. IMPRESSION: 1. Mild bibasilar atelectasis. 2. Normal bowel gas pattern. Electronically Signed   By:  Franchot Gallo M.D.   On: 02/13/2022 16:21    My personal review of EKG: Rhythm NSR, QTc 502   Assessment & Plan:    Principal Problem:   Ascites   Ascites-patient has history of liver cirrhosis, last paracentesis was last week at that time 4.5 L of fluid was removed.  Plan was to follow-up with gastroenterology as outpatient.  Now again presenting with worsening shortness of breath and ascites.  Will order ultrasound-guided paracentesis with maximum 4 L of ascitic fluid to be removed.  Consider giving albumin 25 g IV x1 after paracentesis in a.m. Chronic hypotension-continue midodrine 20 mg p.o. 3 times daily Acute kidney injury on CKD stage IIIb-creatinine is worse today at 2.6, baseline creatinine is 1.6-1.9.  Will  hold diuretics at this time.  Follow BMP in am. Liver cirrhosis-continue lactulose.  Diuretics on hold as above due to worsening renal function. Diabetes mellitus type 2-we will start sliding scale insulin NovoLog, Semglee 10 units subcu daily. Hyponatremia-likely hypervolemic hyponatremia from underlying cirrhosis.  Diuretics on hold.  Follow BMP in am.   DVT Prophylaxis-    SCDs   AM Labs Ordered, also please review Full Orders  Family Communication: Admission, patients condition and plan of care including tests being ordered have been discussed with the patient and family members at bedside who indicate understanding and agree with the plan and Code Status.  Code Status: Full code  Admission status: Observation/Inpatient :The appropriate admission status for this patient is INPATIENT. Inpatient status is judged to be reasonable and necessary in order to provide the required intensity of service to ensure the patient's safety. The patient's presenting symptoms, physical exam findings, and initial radiographic and laboratory data in the context of their chronic comorbidities is felt to place them at high risk for further clinical deterioration. Furthermore, it is not  anticipated that the patient will be medically stable for discharge from the hospital within 2 midnights of admission. The following factors support the admission status of inpatient.         * I certify that at the point of admission it is my clinical judgment that the patient will require inpatient hospital care spanning beyond 2 midnights from the point of admission due to high intensity of service, high risk for further deterioration and high frequency of surveillance required.*  Time spent in minutes : 60 minutes   Michiah Masse S Jourden Delmont M.D

## 2022-02-14 ENCOUNTER — Observation Stay (HOSPITAL_COMMUNITY): Payer: 59

## 2022-02-14 ENCOUNTER — Encounter (HOSPITAL_COMMUNITY): Payer: Self-pay | Admitting: Family Medicine

## 2022-02-14 DIAGNOSIS — N179 Acute kidney failure, unspecified: Secondary | ICD-10-CM

## 2022-02-14 DIAGNOSIS — K7460 Unspecified cirrhosis of liver: Secondary | ICD-10-CM | POA: Diagnosis not present

## 2022-02-14 DIAGNOSIS — K7031 Alcoholic cirrhosis of liver with ascites: Secondary | ICD-10-CM | POA: Diagnosis not present

## 2022-02-14 DIAGNOSIS — D696 Thrombocytopenia, unspecified: Secondary | ICD-10-CM

## 2022-02-14 DIAGNOSIS — N1832 Chronic kidney disease, stage 3b: Secondary | ICD-10-CM | POA: Diagnosis not present

## 2022-02-14 DIAGNOSIS — D638 Anemia in other chronic diseases classified elsewhere: Secondary | ICD-10-CM | POA: Diagnosis not present

## 2022-02-14 DIAGNOSIS — R188 Other ascites: Secondary | ICD-10-CM

## 2022-02-14 DIAGNOSIS — E1121 Type 2 diabetes mellitus with diabetic nephropathy: Secondary | ICD-10-CM

## 2022-02-14 DIAGNOSIS — K219 Gastro-esophageal reflux disease without esophagitis: Secondary | ICD-10-CM

## 2022-02-14 DIAGNOSIS — I25119 Atherosclerotic heart disease of native coronary artery with unspecified angina pectoris: Secondary | ICD-10-CM

## 2022-02-14 DIAGNOSIS — I10 Essential (primary) hypertension: Secondary | ICD-10-CM

## 2022-02-14 LAB — BODY FLUID CELL COUNT WITH DIFFERENTIAL
Eos, Fluid: 0 %
Lymphs, Fluid: 58 %
Monocyte-Macrophage-Serous Fluid: 39 % — ABNORMAL LOW (ref 50–90)
Neutrophil Count, Fluid: 3 % (ref 0–25)
Total Nucleated Cell Count, Fluid: 187 cu mm (ref 0–1000)

## 2022-02-14 LAB — COMPREHENSIVE METABOLIC PANEL
ALT: 58 U/L — ABNORMAL HIGH (ref 0–44)
AST: 59 U/L — ABNORMAL HIGH (ref 15–41)
Albumin: 2.5 g/dL — ABNORMAL LOW (ref 3.5–5.0)
Alkaline Phosphatase: 68 U/L (ref 38–126)
Anion gap: 9 (ref 5–15)
BUN: 30 mg/dL — ABNORMAL HIGH (ref 8–23)
CO2: 26 mmol/L (ref 22–32)
Calcium: 8.3 mg/dL — ABNORMAL LOW (ref 8.9–10.3)
Chloride: 95 mmol/L — ABNORMAL LOW (ref 98–111)
Creatinine, Ser: 1.87 mg/dL — ABNORMAL HIGH (ref 0.61–1.24)
GFR, Estimated: 38 mL/min — ABNORMAL LOW (ref >60)
Glucose, Bld: 181 mg/dL — ABNORMAL HIGH (ref 70–99)
Potassium: 3.5 mmol/L (ref 3.5–5.1)
Sodium: 130 mmol/L — ABNORMAL LOW (ref 135–145)
Total Bilirubin: 3.8 mg/dL — ABNORMAL HIGH (ref 0.3–1.2)
Total Protein: 5.3 g/dL — ABNORMAL LOW (ref 6.5–8.1)

## 2022-02-14 LAB — GRAM STAIN

## 2022-02-14 LAB — GLUCOSE, CAPILLARY
Glucose-Capillary: 164 mg/dL — ABNORMAL HIGH (ref 70–99)
Glucose-Capillary: 147 mg/dL — ABNORMAL HIGH (ref 70–99)
Glucose-Capillary: 156 mg/dL — ABNORMAL HIGH (ref 70–99)

## 2022-02-14 LAB — CBC
HCT: 27.2 % — ABNORMAL LOW (ref 39.0–52.0)
Hemoglobin: 9.6 g/dL — ABNORMAL LOW (ref 13.0–17.0)
MCH: 35.4 pg — ABNORMAL HIGH (ref 26.0–34.0)
MCHC: 35.3 g/dL (ref 30.0–36.0)
MCV: 100.4 fL — ABNORMAL HIGH (ref 80.0–100.0)
Platelets: 68 10*3/uL — ABNORMAL LOW (ref 150–400)
RBC: 2.71 MIL/uL — ABNORMAL LOW (ref 4.22–5.81)
RDW: 14.3 % (ref 11.5–15.5)
WBC: 4 10*3/uL (ref 4.0–10.5)
nRBC: 0 % (ref 0.0–0.2)

## 2022-02-14 MED ORDER — ALBUMIN HUMAN 25 % IV SOLN: 25.0000 g | Freq: Once | INTRAVENOUS | Status: AC

## 2022-02-14 MED ORDER — ALBUMIN HUMAN 25 % IV SOLN
INTRAVENOUS | Status: AC
  Administered 2022-02-14: 25 g via INTRAVENOUS
  Filled 2022-02-14: qty 100

## 2022-02-14 NOTE — Progress Notes (Signed)
Patient slept on and off this shift waking for vitals and blood glucose levels to be checked, no complaints of pain. Continued to monitor.

## 2022-02-14 NOTE — Discharge Summary (Signed)
Physician Discharge Summary  Edward [name] GEX:[id number] DOB: January 18, 1951 DOA: 02/13/2022  PCP: [name], FNP  Admit date: 02/13/2022 Discharge date: 02/14/2022 Admitted From: Home Disposition: Home  Recommendations for Outpatient Follow-up:  Follow up with PCP in 1 to 2 weeks Outpatient follow-up with GI as previously planned Check BMP and CBC in 1 to 2 weeks Please follow up on the following pending results: Peritoneal fluid cytology and culture  Home Health: Not indicated Equipment/Devices: Patient has appropriate DME at home  Discharge Condition: Stable CODE STATUS: Full code  Follow-up [place], [place], [place]. Schedule an appointment as soon as possible for a visit in 1 week(s).   Specialty: Family Medicine Contact information: [place] [address] [phone]         Satira Sark, MD .   Specialty: Cardiology Contact information: [place] [address] [phone]                 Hospital course 71 year old M with PMH of liver cirrhosis, DM-2, CKD-3B, CAD, HTN and hypertension presenting with increased abdominal swelling and shortness of breath and admitted for liver cirrhosis with ascites.  Patient was agreeable for outpatient paracentesis on 02/16/2022.  However, his symptoms became worse to wait for outpatient paracentesis, and he presented to ED.  In ED, afebrile.  Vital stable.  Labs with Na 129, Cr 2.1 (baseline 1.6-1.7), K4.0, lipase 81, AST 84, ALT 75, total bili 3.9, Hgb 11.3, platelets 74 and INR 1.5.  CXR and KUB without significant finding.  Patient underwent paracentesis with removal of 3.4 L and significant improvement in his symptoms.  Fluid cell count and Gram stain not consistent with SBP.  He has no abdominal tenderness on exam either.  He feels well and ready to go home.  Patient to continue home Lasix and Aldactone.  Also counseled on the importance of  sodium restriction.  Peritoneal fluid culture and cytology pending at time of discharge.  Recommended outpatient follow-up with GI as previously planned or sooner if needed  AKI basically resolved at time of discharge.  Hyponatremia stable.  LFT improved.  CBC is stable.  See individual problem list below for more.   Problems addressed during this hospitalization Principal Problem:   Cirrhosis of liver with ascites ([facility]) Active Problems:   AKI (acute kidney injury) ([facility])   CKD (chronic kidney disease) stage 3B, GFR 30-59 ml/min (HCC)   Thrombocytopenia (HCC)   Coronary atherosclerosis of native coronary artery   Essential hypertension   Gastroesophageal reflux disease without esophagitis   Type 2 diabetes with nephropathy (HCC)   Anemia of chronic disease              Vital signs Vitals:   02/14/22 0840 02/14/22 0850 02/14/22 0936 02/14/22 1133  BP: (!) 116/53 (!) 108/54 (!) 102/59 Comment: Nurse Deanna aware (!) 100/59 Comment: primary RN Deanna alerted  Pulse: 83 79 79   Temp:   98.1 F (36.7 C)   Resp: '18 18 16   '$ Height:      Weight:      SpO2: 96% 96% 100%   TempSrc:   Oral   BMI (Calculated):         Discharge exam  GENERAL: No apparent distress.  Nontoxic. HEENT: MMM.  Vision and hearing grossly intact.  NECK: Supple.  No apparent JVD.  RESP:  No IWOB.  Fair aeration bilaterally. CVS:  RRR. Heart sounds normal.  ABD/GI/GU: BS+. Abd soft, NTND.  MSK/EXT:  Moves extremities.  Significant muscle mass and subcu fat loss. SKIN: no apparent skin lesion or wound NEURO: Awake and alert. Oriented appropriately.  No apparent focal neuro deficit. PSYCH: Calm. Normal affect.   Discharge Instructions Discharge Instructions     Call MD for:  difficulty breathing, headache or visual disturbances   Complete by: As directed    Call MD for:  extreme fatigue   Complete by: As directed    Call MD for:  persistant dizziness or light-headedness   Complete by: As  directed    Diet - low sodium heart healthy   Complete by: As directed    Diet Carb Modified   Complete by: As directed    Discharge instructions   Complete by: As directed    It has been a pleasure taking care of you!  You were hospitalized due to abdominal swelling and shortness of breath likely from ascites (fluid in your belly) that has been drained.  Continue taking your fluid and other medications as prescribed.  Follow-up with your primary care doctor and gastroenterologist in 1 to 2 weeks ago sooner if needed.  In addition to taking your medications as prescribed, we also recommend you avoid alcohol or over-the-counter pain medication other than plain Tylenol, limit the amount of water/fluid you drink to less than 6 cups (1500 cc) a day,  limit your sodium (salt) intake to less than 2 g (2000 mg) a day and weigh yourself daily at the same time and keeping your weight log.     Take care,   Increase activity slowly   Complete by: As directed       Allergies as of 02/14/2022   No Known Allergies      Medication List     TAKE these medications    albuterol 108 (90 Base) MCG/ACT inhaler Commonly known as: VENTOLIN HFA Inhale 1-2 puffs into the lungs every 6 (six) hours as needed for wheezing or shortness of breath.   brimonidine 0.2 % ophthalmic solution Commonly known as: ALPHAGAN Place 1 drop into both eyes 2 (two) times daily.   furosemide 40 MG tablet Commonly known as: Lasix Take 1 tablet (40 mg total) by mouth daily.   lactulose 10 GM/15ML solution Commonly known as: CHRONULAC Take 15 mLs (10 g total) by mouth 2 (two) times daily.   linagliptin 5 MG Tabs tablet Commonly known as: TRADJENTA Take 1 tablet (5 mg total) by mouth daily.   midodrine 10 MG tablet Commonly known as: PROAMATINE Take 2 tablets (20 mg total) by mouth 3 (three) times daily with meals.   pantoprazole 40 MG tablet Commonly known as: PROTONIX Take 1 tablet (40 mg total) by mouth 2  (two) times daily.   potassium chloride 10 MEQ tablet Commonly known as: KLOR-CON Take 10 mEq by mouth daily.   sodium bicarbonate 650 MG tablet Take 1 tablet (650 mg total) by mouth 3 (three) times daily.   spironolactone 25 MG tablet Commonly known as: ALDACTONE Take 1 tablet (25 mg total) by mouth daily.   tamsulosin 0.4 MG Caps capsule Commonly known as: FLOMAX Take 1 capsule (0.4 mg total) by mouth daily after supper.   traZODone 50 MG tablet Commonly known as: DESYREL Take 1 tablet (50 mg total) by mouth at bedtime.        Consultations: IR Procedures/Studies:    US Paracentesis  Result Date: 02/14/2022 INDICATION: Cirrhosis with recurrent ascites EXAM: ULTRASOUND GUIDED RLQ  PARACENTESIS MEDICATIONS: None. COMPLICATIONS: None immediate. PROCEDURE: Informed written consent was obtained from the patient after a discussion of the risks, benefits and alternatives to treatment. A timeout was performed prior to the initiation of the procedure. Initial ultrasound scanning demonstrates a large amount of ascites within the right lower abdominal quadrant. The right lower abdomen was prepped and draped in the usual sterile fashion. 1% lidocaine was used for local anesthesia. Following this, a 19 gauge, 7-cm, Yueh catheter was introduced. An ultrasound image was saved for documentation purposes. The paracentesis was performed. The catheter was removed and a dressing was applied. The patient tolerated the procedure well without immediate post procedural complication. Patient received post-procedure intravenous albumin; see nursing notes for details. FINDINGS: A total of approximately 3.4L of peritoneal fluid was removed. Samples were sent to the laboratory as requested by the clinical team. IMPRESSION: Successful ultrasound-guided paracentesis yielding 3.4 liters of peritoneal fluid. PLAN: The patient has required >/=2 paracenteses in a 30 day period and a formal evaluation by the  [facility] has been arranged. Electronically Signed   By: Lavonia Dana M.D.   On: 02/14/2022 10:56   DG Abdomen Acute W/Chest  Result Date: 02/13/2022 CLINICAL DATA:  Short of breath, abdominal pain. History of cirrhosis EXAM: DG ABDOMEN ACUTE WITH 1 VIEW CHEST COMPARISON:  Chest and abdomen 02/04/2022 FINDINGS: Heart size and vascularity normal. Mild bibasilar atelectasis. No pleural effusion Normal bowel gas pattern. No bowel obstruction or free air. Negative for urinary tract calculi. IMPRESSION: 1. Mild bibasilar atelectasis. 2. Normal bowel gas pattern. Electronically Signed   By: Franchot Gallo M.D.   On: 02/13/2022 16:21   US Paracentesis  Result Date: 02/05/2022 INDICATION: Cirrhosis, ascites, fever EXAM: ULTRASOUND GUIDED DIAGNOSTIC AND THERAPEUTIC PARACENTESIS MEDICATIONS: None. COMPLICATIONS: None immediate. PROCEDURE: Informed written consent was obtained from the patient after a discussion of the risks, benefits and alternatives to treatment. A timeout was performed prior to the initiation of the procedure. Initial ultrasound scanning demonstrates a large amount of ascites within the right lower abdominal quadrant. The right lower abdomen was prepped and draped in the usual sterile fashion. 1% lidocaine was used for local anesthesia. Following this, a 19 gauge, 7-cm, Yueh catheter was introduced. An ultrasound image was saved for documentation purposes. The paracentesis was performed. The catheter was removed and a dressing was applied. The patient tolerated the procedure well without immediate post procedural complication. Patient received post-procedure intravenous albumin; see nursing notes for details. FINDINGS: A total of approximately 4.5 L of clear yellow ascitic fluid was removed. Samples were sent to the laboratory as requested by the clinical team. IMPRESSION: Successful ultrasound-guided paracentesis yielding 4.5 liters of peritoneal  fluid. Electronically Signed   By: Lavonia Dana M.D.   On: 02/05/2022 09:54   CT ABDOMEN PELVIS WO CONTRAST  Result Date: 02/04/2022 CLINICAL DATA:  Bowel obstruction suspected EXAM: CT ABDOMEN AND PELVIS WITHOUT CONTRAST TECHNIQUE: Multidetector CT imaging of the abdomen and pelvis was performed following the standard protocol without IV contrast. RADIATION DOSE REDUCTION: This exam was performed according to the departmental dose-optimization program which includes automated exposure control, adjustment of the mA and/or kV according to patient size and/or use of iterative reconstruction technique. COMPARISON:  CT abdomen pelvis 11/24/2021 FINDINGS: Lower chest: Trace bilateral pleural effusions. Thickening of the visualized distal esophagus likely due to paraseptal varices. Four-vessel coronary artery calcification. Hepatobiliary: Nodular hepatic contour. Enlarged caudate lobe. No focal liver abnormality. Calcified gallstones noted within the gallbladder lumen.  No gallbladder wall thickening or pericholecystic fluid. No biliary dilatation. Pancreas: No focal lesion. Normal pancreatic contour. No surrounding inflammatory changes. No main pancreatic ductal dilatation. Spleen: The spleen is enlarged in size measuring up to 14 cm. No focal splenic lesion. Adrenals/Urinary Tract: No adrenal nodule bilaterally. No nephrolithiasis and no hydronephrosis. No definite contour-deforming renal mass. No ureterolithiasis or hydroureter. The urinary bladder is unremarkable. Stomach/Bowel: Stomach is within normal limits. No evidence of bowel wall thickening or dilatation. Portal colopathy of the ascending colon. Sigmoid diverticulosis. Appendix appears normal. Vascular/Lymphatic: No abdominal aorta or iliac aneurysm. Mild to moderate atherosclerotic plaque of the aorta and its branches. No abdominal, pelvic, or inguinal lymphadenopathy. Reproductive: Prostate is unremarkable. Other: Interval slight increase in moderate volume  free intraperitoneal fluid. Esophageal and paraesophageal venous collaterals. No intraperitoneal free gas. No organized fluid collection. Musculoskeletal: No abdominal wall hernia or abnormality. No suspicious lytic or blastic osseous lesions. No acute displaced fracture. Multilevel degenerative changes of the spine. IMPRESSION: 1. Trace bilateral pleural effusions. 2. Slight interval increase in size of moderate volume ascites. 3. Cirrhosis with portal hypertension. No focal liver lesions identified. Please note that liver protocol enhanced MR and CT are the most sensitive tests for the screening detection of hepatocellular carcinoma in the high risk setting of cirrhosis. 4. Thickening of the visualized distal esophagus likely due to esophageal and paraesophageal varices. Markedly limited evaluation due to noncontrast study. 5. Portal colopathy of the ascending colon. 6. Cholelithiasis with no CT finding of acute cholecystitis. 7. Colonic diverticulosis with no acute diverticulitis. 8. Aortic Atherosclerosis (ICD10-I70.0) including four-vessel coronary calcification. Electronically Signed   By: Iven Finn M.D.   On: 02/04/2022 16:10   DG Abdomen Acute W/Chest  Result Date: 02/04/2022 CLINICAL DATA:  Cough. Stomach distention. Fever, chills, and fatigue. Decreased urinary output. EXAM: DG ABDOMEN ACUTE WITH 1 VIEW CHEST COMPARISON:  Chest radiographs 01/23/2022 and 12/08/2021. CT abdomen and pelvis 11/24/2021 FINDINGS: Cardiac silhouette and mediastinal contours are within normal limits. Moderately decreased lung volumes, mildly worsened from 01/23/2022 most recent prior chest radiograph. Right-greater-than-left horizontal linear basilar likely subsegmental atelectasis is mildly increased from 01/22/2022. No pleural effusion or pneumothorax. Moderate stool within the ascending and transverse colon. Air is seen within loops of small bowel as well as the ascending, transverse, descending colon, and rectum. On  supine view, there is a single loop of vertically oriented bowel overlying the lateral left hemiabdomen that is mildly distended, measuring up to 3.5 cm. Mild-to-moderate multilevel degenerative disc changes of the visualized thoracic and lumbar spine. Mild levocurvature of the upper lumbar spine. No subdiaphragmatic free air on upright view. IMPRESSION: 1. Right-greater-than-left horizontal linear basilar likely subsegmental atelectasis. 2. Moderate stool within the ascending and transverse colon. 3. On supine view, there is a single loop of vertically oriented bowel overlying the lateral left hemiabdomen that is mildly distended. This may represent a focal ileus. Air is seen within the more distal colon. Electronically Signed   By: Yvonne Kendall M.D.   On: 02/04/2022 15:02   US Paracentesis  Result Date: 01/23/2022 INDICATION: Ascites. EXAM: ULTRASOUND GUIDED DIAGNOSTIC AND THERAPEUTIC PARACENTESIS MEDICATIONS: None. COMPLICATIONS: None immediate. PROCEDURE: Informed written consent was obtained from the patient after a discussion of the risks, benefits and alternatives to treatment. A timeout was performed prior to the initiation of the procedure. Initial ultrasound scanning demonstrates a large amount of ascites within the right lower abdominal quadrant. The right lower abdomen was prepped and draped in the usual sterile fashion. 1%  lidocaine was used for local anesthesia. Following this, a 19 gauge, 7-cm, Yueh catheter was introduced. An ultrasound image was saved for documentation purposes. The paracentesis was performed. The catheter was removed and a dressing was applied. The patient tolerated the procedure well without immediate post procedural complication. FINDINGS: A total of approximately 4.9 L of clear yellow fluid was removed. Samples were sent to the laboratory as requested by the clinical team. IMPRESSION: 1. Successful ultrasound-guided paracentesis yielding 4.9 liters of peritoneal fluid.  Electronically Signed   By: Titus Dubin M.D.   On: 01/23/2022 15:29   DG Chest 2 View  Result Date: 01/23/2022 CLINICAL DATA:  Provided history: Shortness of breath. EXAM: CHEST - 2 VIEW COMPARISON:  Prior chest radiographs 12/08/2021 and earlier. FINDINGS: Shallow inspiration radiograph. Heart size within normal limits. Aortic atherosclerosis. Mild ill-defined opacity within the bilateral lung bases, with an appearance favoring atelectasis. No sizable pleural effusion. No evidence of pneumothorax. Degenerative changes of the spine. IMPRESSION: Shallow inspiration radiograph. Mild ill-defined opacity within the bilateral lung bases, with an appearance favoring atelectasis. Aortic Atherosclerosis (ICD10-I70.0). Electronically Signed   By: Kellie Simmering D.O.   On: 01/23/2022 11:46       The results of significant diagnostics from this hospitalization (including imaging, microbiology, ancillary and laboratory) are listed below for reference.     Microbiology: Recent Results (from the past 240 hour(s))  Blood culture (routine x 2)     Status: None   Collection Time: 02/04/22  5:06 PM   Specimen: Site Not Specified; Blood  Result Value Ref Range Status   Specimen Description   Final    SITE NOT SPECIFIED BOTTLES DRAWN AEROBIC AND ANAEROBIC   Special Requests Blood Culture adequate volume  Final   Culture   Final    NO GROWTH 5 DAYS Performed at [facility], [address], [place], [place] [address]    Report Status 02/09/2022 FINAL  Final  Blood culture (routine x 2)     Status: None   Collection Time: 02/04/22  5:06 PM   Specimen: Site Not Specified; Blood  Result Value Ref Range Status   Specimen Description   Final    SITE NOT SPECIFIED BOTTLES DRAWN AEROBIC AND ANAEROBIC   Special Requests Blood Culture adequate volume  Final   Culture   Final    NO GROWTH 5 DAYS Performed at [facility], [address] [place], [place] [address]    Report Status 02/09/2022 FINAL  Final   Resp Panel by RT-PCR (Flu A&B, Covid) Anterior Nasal Swab     Status: None   Collection Time: 02/04/22  6:35 PM   Specimen: Anterior Nasal Swab  Result Value Ref Range Status   SARS Coronavirus 2 by RT PCR NEGATIVE NEGATIVE Final    Comment: (NOTE) SARS-CoV-2 target nucleic acids are NOT DETECTED.  The SARS-CoV-2 RNA is generally detectable in upper respiratory specimens during the acute phase of infection. The lowest concentration of SARS-CoV-2 viral copies this assay can detect is 138 copies/mL. A negative result does not preclude SARS-Cov-2 infection and should not be used as the sole basis for treatment or other patient management decisions. A negative result may occur with  improper specimen collection/handling, submission of specimen other than nasopharyngeal swab, presence of viral mutation(s) within the areas targeted by this assay, and inadequate number of viral copies(<138 copies/mL). A negative result must be combined with clinical observations, patient history, and epidemiological information. The expected result is Negative.  Fact Sheet for  Patients:  EntrepreneurPulse.com.au  Fact Sheet for Healthcare Providers:  IncredibleEmployment.be  This test is no t yet approved or cleared by the [place] FDA and  has been authorized for detection and/or diagnosis of SARS-CoV-2 by FDA under an Emergency Use Authorization (EUA). This EUA will remain  in effect (meaning this test can be used) for the duration of the COVID-19 declaration under Section 564(b)(1) of the Act, 21 U.S.C.section 360bbb-3(b)(1), unless the authorization is terminated  or revoked sooner.       Influenza A by PCR NEGATIVE NEGATIVE Final   Influenza B by PCR NEGATIVE NEGATIVE Final    Comment: (NOTE) The Xpert Xpress SARS-CoV-2/FLU/RSV plus assay is intended as an aid in the diagnosis of influenza from Nasopharyngeal swab specimens and should not be used as a  sole basis for treatment. Nasal washings and aspirates are unacceptable for Xpert Xpress SARS-CoV-2/FLU/RSV testing.  Fact Sheet for Patients: EntrepreneurPulse.com.au  Fact Sheet for Healthcare Providers: IncredibleEmployment.be  This test is not yet approved or cleared by the [place] FDA and has been authorized for detection and/or diagnosis of SARS-CoV-2 by FDA under an Emergency Use Authorization (EUA). This EUA will remain in effect (meaning this test can be used) for the duration of the COVID-19 declaration under Section 564(b)(1) of the Act, 21 U.S.C. section 360bbb-3(b)(1), unless the authorization is terminated or revoked.  Performed at [facility], [address], [place], [place] [address]   Culture, body fluid w Gram Stain-bottle     Status: None   Collection Time: 02/05/22  9:01 AM   Specimen: Peritoneal Washings  Result Value Ref Range Status   Specimen Description PERITONEAL  Final   Special Requests BOTTLES DRAWN AEROBIC AND ANAEROBIC 10CC  Final   Culture   Final    NO GROWTH 5 DAYS Performed at [facility], [address], [place], [place] [address]    Report Status 02/10/2022 FINAL  Final  Gram stain     Status: None   Collection Time: 02/05/22  9:01 AM   Specimen: Peritoneal Washings  Result Value Ref Range Status   Specimen Description PERITONEAL  Final   Special Requests NONE  Final   Gram Stain   Final    NO ORGANISMS SEEN WBC PRESENT, PREDOMINANTLY MONONUCLEAR CYTOSPIN SMEAR Performed at [facility], [address], [place], [place] [address]    Report Status 02/05/2022 FINAL  Final  Gram stain     Status: None   Collection Time: 02/14/22  8:40 AM   Specimen: Peritoneal Washings  Result Value Ref Range Status   Specimen Description PERITONEAL ASC  Final   Special Requests NONE  Final   Gram Stain   Final    NO ORGANISMS SEEN WBC PRESENT, PREDOMINANTLY MONONUCLEAR CYTOSPIN SMEAR Performed at [facility], [address], [place],  [address]    Report Status 02/14/2022 FINAL  Final     Labs:  CBC: Recent Labs  Lab 02/08/22 0329 02/13/22 1450 02/14/22 0408  WBC 5.3 5.0 4.0  NEUTROABS  --  3.7  --   HGB 10.4* 11.3* 9.6*  HCT 29.6* 32.0* 27.2*  MCV 99.7 100.3* 100.4*  PLT 54* 74* 68*   BMP &GFR Recent Labs  Lab 02/08/22 0329 02/13/22 1450 02/14/22 0408  NA 134* 129* 130*  K 3.3* 4.0 3.5  CL 96* 91* 95*  CO2 '25 26 26  '$ GLUCOSE 164* 333* 181*  BUN 32* 30* 30*  CREATININE 1.63* 2.10* 1.87*  CALCIUM 8.5* 8.6* 8.3*  Estimated Creatinine Clearance: 37.8 mL/min (A) (by C-G formula based on SCr of 1.87 mg/dL (H)). Liver & Pancreas: Recent Labs  Lab 02/08/22 0329 02/13/22 1450 02/14/22 0408  AST 54* 84* 59*  ALT 43 75* 58*  ALKPHOS 62 90 68  BILITOT 2.2* 3.9* 3.8*  PROT 5.6* 6.3* 5.3*  ALBUMIN 2.6* 3.0* 2.5*   Recent Labs  Lab 02/13/22 1450  LIPASE 81*   No results for input(s): "AMMONIA" in the last 168 hours. Diabetic: No results for input(s): "HGBA1C" in the last 72 hours. Recent Labs  Lab 02/13/22 1953 02/13/22 2348 02/14/22 0537 02/14/22 0717 02/14/22 1121  GLUCAP 262* 220* 164* 147* 156*   Cardiac Enzymes: No results for input(s): "CKTOTAL", "CKMB", "CKMBINDEX", "TROPONINI" in the last 168 hours. No results for input(s): "PROBNP" in the last 8760 hours. Coagulation Profile: Recent Labs  Lab 02/13/22 1920  INR 1.5*   Thyroid Function Tests: No results for input(s): "TSH", "T4TOTAL", "FREET4", "T3FREE", "THYROIDAB" in the last 72 hours. Lipid Profile: No results for input(s): "CHOL", "HDL", "LDLCALC", "TRIG", "CHOLHDL", "LDLDIRECT" in the last 72 hours. Anemia Panel: No results for input(s): "VITAMINB12", "FOLATE", "FERRITIN", "TIBC", "IRON", "RETICCTPCT" in the last 72 hours. Urine analysis:    Component Value Date/Time   COLORURINE YELLOW 02/04/2022 1216   APPEARANCEUR CLEAR 02/04/2022 1216   LABSPEC 1.009 02/04/2022 1216    PHURINE 5.0 02/04/2022 1216   GLUCOSEU NEGATIVE 02/04/2022 1216   HGBUR SMALL (A) 02/04/2022 1216   BILIRUBINUR NEGATIVE 02/04/2022 1216   KETONESUR NEGATIVE 02/04/2022 1216   PROTEINUR NEGATIVE 02/04/2022 1216   NITRITE POSITIVE (A) 02/04/2022 1216   LEUKOCYTESUR NEGATIVE 02/04/2022 1216   Sepsis Labs: Invalid input(s): "PROCALCITONIN", "LACTICIDVEN"   SIGNED:  Mercy Riding, MD  [facility] 02/14/2022, 4:35 PM

## 2022-02-14 NOTE — Procedures (Signed)
PROCEDURE SUMMARY:  Successful ultrasound guided paracentesis from the RLQ.  Yielded 3.4L of peritoneal fluid.  No immediate complications.  The patient tolerated the procedure well.   Specimen was sent for labs.  EBL < 49m  The patient has required >/=2 paracenteses in a 30 day period and a screening evaluation by the [facility] has been arranged.   Electronically Signed: HPasty Spillers PA-C 02/14/2022, 9:57 AM

## 2022-02-14 NOTE — Progress Notes (Signed)
PT tolerated right sided paracentesis procedure and 25G of IV albumin well today and 3.4 Liters of clear yellow ascites removed with labs collected and sent for processing. PT verbalized understanding of post procedure instructions and transported back to inpatient bed assignment at this time via stretcher with no acute distress noted.

## 2022-02-14 NOTE — Progress Notes (Signed)
  Transition of Care [facility]) Screening Note   Patient Details  Name: Edward [name] [facility] Date of Birth: 04-13-1951   Transition of Care [facility]) CM/SW Contact:    Ihor Gully, LCSW Phone Number: 02/14/2022, 1:02 PM    Transition of Care Department [facility]) has reviewed patient and no TOC needs have been identified at this time. We will continue to monitor patient advancement through interdisciplinary progression rounds. If new patient transition needs arise, please place a TOC consult.

## 2022-02-15 LAB — CYTOLOGY - NON PAP

## 2022-02-16 ENCOUNTER — Ambulatory Visit (HOSPITAL_COMMUNITY): Admission: RE | Admit: 2022-02-16 | Payer: 59 | Source: Ambulatory Visit

## 2022-02-19 ENCOUNTER — Other Ambulatory Visit: Payer: Self-pay

## 2022-02-19 ENCOUNTER — Encounter (HOSPITAL_COMMUNITY): Payer: Self-pay

## 2022-02-19 ENCOUNTER — Other Ambulatory Visit: Payer: Self-pay | Admitting: *Deleted

## 2022-02-19 ENCOUNTER — Telehealth: Payer: Self-pay | Admitting: Gastroenterology

## 2022-02-19 ENCOUNTER — Encounter: Payer: Self-pay | Admitting: Cardiology

## 2022-02-19 ENCOUNTER — Telehealth: Payer: Self-pay

## 2022-02-19 ENCOUNTER — Ambulatory Visit (INDEPENDENT_AMBULATORY_CARE_PROVIDER_SITE_OTHER): Payer: 59 | Admitting: Cardiology

## 2022-02-19 ENCOUNTER — Ambulatory Visit (HOSPITAL_COMMUNITY)
Admission: RE | Admit: 2022-02-19 | Discharge: 2022-02-19 | Disposition: A | Discharge status: Home / Self Care | Payer: 59 | Source: Ambulatory Visit | Attending: Gastroenterology | Admitting: Gastroenterology

## 2022-02-19 VITALS — BP 128/70 | HR 99 | Ht 70.0 in | Wt 142.4 lb

## 2022-02-19 DIAGNOSIS — K7460 Unspecified cirrhosis of liver: Secondary | ICD-10-CM | POA: Insufficient documentation

## 2022-02-19 DIAGNOSIS — I951 Orthostatic hypotension: Secondary | ICD-10-CM

## 2022-02-19 DIAGNOSIS — N1832 Chronic kidney disease, stage 3b: Secondary | ICD-10-CM

## 2022-02-19 DIAGNOSIS — I252 Old myocardial infarction: Secondary | ICD-10-CM

## 2022-02-19 DIAGNOSIS — I864 Gastric varices: Secondary | ICD-10-CM | POA: Diagnosis present

## 2022-02-19 DIAGNOSIS — I25119 Atherosclerotic heart disease of native coronary artery with unspecified angina pectoris: Secondary | ICD-10-CM | POA: Insufficient documentation

## 2022-02-19 DIAGNOSIS — K766 Portal hypertension: Secondary | ICD-10-CM | POA: Diagnosis present

## 2022-02-19 DIAGNOSIS — Z955 Presence of coronary angioplasty implant and graft: Secondary | ICD-10-CM

## 2022-02-19 DIAGNOSIS — R188 Other ascites: Secondary | ICD-10-CM | POA: Insufficient documentation

## 2022-02-19 DIAGNOSIS — G928 Other toxic encephalopathy: Secondary | ICD-10-CM | POA: Diagnosis present

## 2022-02-19 DIAGNOSIS — L89151 Pressure ulcer of sacral region, stage 1: Secondary | ICD-10-CM | POA: Diagnosis present

## 2022-02-19 DIAGNOSIS — J9601 Acute respiratory failure with hypoxia: Secondary | ICD-10-CM | POA: Diagnosis present

## 2022-02-19 DIAGNOSIS — Z7984 Long term (current) use of oral hypoglycemic drugs: Secondary | ICD-10-CM

## 2022-02-19 DIAGNOSIS — R578 Other shock: Secondary | ICD-10-CM | POA: Diagnosis present

## 2022-02-19 DIAGNOSIS — E43 Unspecified severe protein-calorie malnutrition: Secondary | ICD-10-CM | POA: Diagnosis present

## 2022-02-19 DIAGNOSIS — Z833 Family history of diabetes mellitus: Secondary | ICD-10-CM

## 2022-02-19 DIAGNOSIS — Z79899 Other long term (current) drug therapy: Secondary | ICD-10-CM

## 2022-02-19 DIAGNOSIS — K767 Hepatorenal syndrome: Secondary | ICD-10-CM | POA: Diagnosis present

## 2022-02-19 DIAGNOSIS — Z8249 Family history of ischemic heart disease and other diseases of the circulatory system: Secondary | ICD-10-CM

## 2022-02-19 DIAGNOSIS — D62 Acute posthemorrhagic anemia: Secondary | ICD-10-CM | POA: Diagnosis present

## 2022-02-19 DIAGNOSIS — K219 Gastro-esophageal reflux disease without esophagitis: Secondary | ICD-10-CM | POA: Diagnosis present

## 2022-02-19 DIAGNOSIS — K7682 Hepatic encephalopathy: Secondary | ICD-10-CM | POA: Diagnosis present

## 2022-02-19 DIAGNOSIS — Z5982 Transportation insecurity: Secondary | ICD-10-CM

## 2022-02-19 DIAGNOSIS — I129 Hypertensive chronic kidney disease with stage 1 through stage 4 chronic kidney disease, or unspecified chronic kidney disease: Secondary | ICD-10-CM | POA: Diagnosis present

## 2022-02-19 DIAGNOSIS — D61818 Other pancytopenia: Secondary | ICD-10-CM | POA: Diagnosis present

## 2022-02-19 DIAGNOSIS — I2510 Atherosclerotic heart disease of native coronary artery without angina pectoris: Secondary | ICD-10-CM | POA: Diagnosis present

## 2022-02-19 DIAGNOSIS — E871 Hypo-osmolality and hyponatremia: Secondary | ICD-10-CM | POA: Diagnosis present

## 2022-02-19 DIAGNOSIS — N179 Acute kidney failure, unspecified: Secondary | ICD-10-CM | POA: Diagnosis present

## 2022-02-19 DIAGNOSIS — I8511 Secondary esophageal varices with bleeding: Secondary | ICD-10-CM | POA: Diagnosis present

## 2022-02-19 DIAGNOSIS — D65 Disseminated intravascular coagulation [defibrination syndrome]: Secondary | ICD-10-CM | POA: Diagnosis present

## 2022-02-19 DIAGNOSIS — E1165 Type 2 diabetes mellitus with hyperglycemia: Secondary | ICD-10-CM | POA: Diagnosis present

## 2022-02-19 DIAGNOSIS — E782 Mixed hyperlipidemia: Secondary | ICD-10-CM | POA: Diagnosis present

## 2022-02-19 DIAGNOSIS — E1122 Type 2 diabetes mellitus with diabetic chronic kidney disease: Secondary | ICD-10-CM | POA: Diagnosis present

## 2022-02-19 DIAGNOSIS — H409 Unspecified glaucoma: Secondary | ICD-10-CM | POA: Diagnosis present

## 2022-02-19 DIAGNOSIS — L89621 Pressure ulcer of left heel, stage 1: Secondary | ICD-10-CM | POA: Diagnosis present

## 2022-02-19 DIAGNOSIS — Z87891 Personal history of nicotine dependence: Secondary | ICD-10-CM

## 2022-02-19 DIAGNOSIS — Z6823 Body mass index (BMI) 23.0-23.9, adult: Secondary | ICD-10-CM

## 2022-02-19 DIAGNOSIS — D684 Acquired coagulation factor deficiency: Secondary | ICD-10-CM | POA: Diagnosis present

## 2022-02-19 LAB — BODY FLUID CELL COUNT WITH DIFFERENTIAL
Eos, Fluid: 0 %
Lymphs, Fluid: 95 %
Monocyte-Macrophage-Serous Fluid: 4 % — ABNORMAL LOW (ref 50–90)
Neutrophil Count, Fluid: 1 % (ref 0–25)
Total Nucleated Cell Count, Fluid: 176 cu mm (ref 0–1000)

## 2022-02-19 LAB — CULTURE, BODY FLUID W GRAM STAIN -BOTTLE: Culture: NO GROWTH

## 2022-02-19 LAB — GRAM STAIN

## 2022-02-19 MED ORDER — ALBUMIN HUMAN 25 % IV SOLN
INTRAVENOUS | Status: AC
  Administered 2022-02-19: 25 g via INTRAVENOUS
  Filled 2022-02-19: qty 100

## 2022-02-19 MED ORDER — ALBUMIN HUMAN 25 % IV SOLN
0.0000 g | Freq: Once | INTRAVENOUS | Status: AC
  Filled 2022-02-19: qty 400

## 2022-02-19 MED ORDER — NITROGLYCERIN 0.4 MG SL SUBL: 0.4000 mg | SUBLINGUAL_TABLET | SUBLINGUAL | 3 refills | Status: AC | PRN

## 2022-02-19 NOTE — Telephone Encounter (Signed)
Mandy: we should have openings tomorrow.

## 2022-02-19 NOTE — Telephone Encounter (Signed)
Phoned the pt and LMOVM to return call 

## 2022-02-19 NOTE — Progress Notes (Signed)
Patient tolerated right sided paracentesis procedure and 25 G of IV albumin well today and 4 Liters of yellow ascites removed with labs collected and sent for processing. PT verbalized understanding of discharge instructions and left via wheelchair with family at this time with no acute distress noted.

## 2022-02-19 NOTE — ED Triage Notes (Signed)
Pt arrived from home via POV w c/o severe pain and cramping in all extremities. Pt stated that he had a paracentesis this am. Pt states that when his hands cramp that he has to take the other hand and pry apart the fingers of the first hand.

## 2022-02-19 NOTE — Patient Instructions (Addendum)

## 2022-02-19 NOTE — Telephone Encounter (Signed)
Edward [name] suggested getting patient in before Nov and there are appts available on 10/17... lm offering [id number] 283 or 200 and asked patient to call us back.

## 2022-02-19 NOTE — Telephone Encounter (Signed)
Wife called requesting the nurse to return her call. Pt seen Vicente Males last.

## 2022-02-19 NOTE — Progress Notes (Signed)
Cardiology Office Note  Date: 02/19/2022   ID: Edward [name], DOB 09-09-1950, MRN [id number]  PCP:  [name], FNP  Cardiologist:  Rozann Lesches, MD Electrophysiologist:  None   Chief Complaint  Patient presents with   Cardiac follow-up    History of Present Illness: Edward [name] is a 71 y.o. male last seen in December 2022.  He is here today with his wife for a follow-up visit. Records indicate recent hospitalization with recurrent ascites requiring paracentesis.  I reviewed his lab work which is noted below.  He is chronically fatigued, reports compliance with his medications and has continued follow-up by GI with essentially weekly paracenteses.  Weight is down in comparison to the recent hospital stay.  He is on Aldactone and Lasix with potassium supplement, also lactulose.  Cardiac regimen was significantly simplified since our last encounter, taken off several medications.  No longer on aspirin or statin.  On Proamatine for blood pressure support and therefore no antihypertensives.  Past Medical History:  Diagnosis Date   Cataract    CKD (chronic kidney disease) stage 3, GFR 30-59 ml/min (HCC)    Coronary atherosclerosis of native coronary artery    BMS LAD 2006; residual 75% distal RCA; EF 50%   Essential hypertension    Glaucoma    Low back pain    MI (myocardial infarction) ([place])    Anterior 2006   Mixed hyperlipidemia    Renal insufficiency    Type 2 diabetes mellitus ([place])     Past Surgical History:  Procedure Laterality Date   CATARACT EXTRACTION, BILATERAL     CORONARY ANGIOPLASTY WITH STENT PLACEMENT  2006   ESOPHAGEAL BANDING  02/01/2022   Procedure: ESOPHAGEAL BANDING;  Surgeon: Eloise Harman, DO;  [facility];  Service: Endoscopy;;   ESOPHAGOGASTRODUODENOSCOPY (EGD) WITH PROPOFOL N/A 02/01/2022   Procedure: ESOPHAGOGASTRODUODENOSCOPY (EGD) WITH PROPOFOL;  Surgeon: Eloise Harman, DO;  Location[facility];  Service:  Endoscopy;  Laterality: N/A;  12:30 am    Current Outpatient Medications  Medication Sig Dispense Refill   albuterol (VENTOLIN HFA) 108 (90 Base) MCG/ACT inhaler Inhale 1-2 puffs into the lungs every 6 (six) hours as needed for wheezing or shortness of breath. 1 each 0   brimonidine (ALPHAGAN) 0.2 % ophthalmic solution Place 1 drop into both eyes 2 (two) times daily.     furosemide (LASIX) 40 MG tablet Take 1 tablet (40 mg total) by mouth daily. 30 tablet 0   lactulose (CHRONULAC) 10 GM/15ML solution Take 15 mLs (10 g total) by mouth 2 (two) times daily. 946 mL 1   linagliptin (TRADJENTA) 5 MG TABS tablet Take 1 tablet (5 mg total) by mouth daily. 30 tablet 1   midodrine (PROAMATINE) 10 MG tablet Take 2 tablets (20 mg total) by mouth 3 (three) times daily with meals. 180 tablet 2   nitroGLYCERIN (NITROSTAT) 0.4 MG SL tablet Place 0.4 mg under the tongue every 5 (five) minutes x 3 doses as needed for chest pain.     pantoprazole (PROTONIX) 40 MG tablet Take 1 tablet (40 mg total) by mouth 2 (two) times daily. 180 tablet 3   potassium chloride (KLOR-CON) 10 MEQ tablet Take 10 mEq by mouth daily.     sodium bicarbonate 650 MG tablet Take 1 tablet (650 mg total) by mouth 3 (three) times daily. 90 tablet 2   spironolactone (ALDACTONE) 25 MG tablet Take 1 tablet (25 mg total) by mouth daily. 30 tablet 0  tamsulosin (FLOMAX) 0.4 MG CAPS capsule Take 1 capsule (0.4 mg total) by mouth daily after supper. 30 capsule 0   traZODone (DESYREL) 50 MG tablet Take 1 tablet (50 mg total) by mouth at bedtime. 30 tablet 1   No current facility-administered medications for this visit.   Allergies:  Patient has no known allergies.   ROS: Intermittent leg cramps.  No palpitations or syncope.  Physical Exam: VS:  BP 128/70 (BP Location: Left Arm, Patient Position: Sitting, Cuff Size: Normal)   Pulse 99   Ht '5\' 10"'$  (1.778 m)   Wt 142 lb 6.4 oz (64.6 kg)   SpO2 98%   BMI 20.43 kg/m , BMI Body mass index is  20.43 kg/m.  Wt Readings from Last 3 Encounters:  02/19/22 142 lb 6.4 oz (64.6 kg)  02/13/22 158 lb (71.7 kg)  02/04/22 158 lb (71.7 kg)    General: Patient appears comfortable at rest. HEENT: Conjunctiva and lids normal. Neck: Supple, no elevated JVP or carotid bruits. Lungs: Clear to auscultation, nonlabored breathing at rest. Cardiac: Regular rate and rhythm, no S3, 1/6 systolic murmur, no pericardial rub. Extremities: No pitting edema.  ECG:  An ECG dated 02/13/2022 was personally reviewed today and demonstrated:  Sinus rhythm with anteroseptal Q waves, nonspecific T wave changes.  Recent Labwork: 01/23/2022: Magnesium 2.0 02/14/2022: ALT 58; AST 59; BUN 30; Creatinine, Ser 1.87; Hemoglobin 9.6; Platelets 68; Potassium 3.5; Sodium 130     Component Value Date/Time   CHOL 98 (L) 11/14/2021 0820   CHOL 177 09/25/2012 1015   TRIG 108 11/14/2021 0820   TRIG 206 (H) 10/07/2014 0927   TRIG 269 (H) 09/25/2012 1015   HDL 18 (L) 11/14/2021 0820   HDL 22 (L) 10/07/2014 0927   HDL 28 (L) 09/25/2012 1015   CHOLHDL 5.4 (H) 11/14/2021 0820   LDLCALC 59 11/14/2021 0820   LDLCALC 98 12/21/2013 0850   LDLCALC 95 09/25/2012 1015    Other Studies Reviewed Today:  [facility] Adam Myoview 02/04/2018: There was no ST segment deviation noted during stress. Defect 1: There is a medium defect of severe severity present in the mid anteroseptal, apical anterior, apical septal and apex location. This is consistent with myocardial scar. Defect 2: There is a medium defect of moderate severity present in the mid inferolateral, apical inferior and apical lateral location. This is consistent with scar with a moderate degree of peri-infarct ischemia. Findings consistent with prior myocardial infarction with moderate peri-infarct ischemia in the inferolateral wall. This is an intermediate risk study. Nuclear stress EF: 53%.  Assessment and Plan:  1.  CAD status post BMS to the LAD in 2006 with moderate  residual RCA disease managed medically.  Reports no active angina at this time with plan for conservative follow-up.  No longer on aspirin or Lipitor in the setting of progressive liver disease.  As needed nitroglycerin available.  2.  Orthostatic hypotension, now off all antihypertensives and on midodrine.  3.  Hepatic cirrhosis with recurrent ascites.  Being followed by gastroenterology.  Undergoing serial paracenteses.  4.  CKD stage IIIb, recent creatinine 1.87.  Medication Adjustments/Labs and Tests Ordered: Current medicines are reviewed at length with the patient today.  Concerns regarding medicines are outlined above.   Tests Ordered: No orders of the defined types were placed in this encounter.   Medication Changes: No orders of the defined types were placed in this encounter.   Disposition:  Follow up  6 months.  Signed, Satira Sark, MD, [facility]  02/19/2022 4:25 PM    [facility] at [address], [place], [place] [address] Phone: [phone]; Fax: [phone]

## 2022-02-20 ENCOUNTER — Inpatient Hospital Stay (HOSPITAL_COMMUNITY): Payer: 59 | Admitting: Anesthesiology

## 2022-02-20 ENCOUNTER — Inpatient Hospital Stay (HOSPITAL_COMMUNITY)
Admission: EM | Admit: 2022-02-20 | Discharge: 2022-03-01 | DRG: 432 | Disposition: A | Discharge status: Home / Self Care | Payer: 59 | Attending: Family Medicine | Admitting: Family Medicine

## 2022-02-20 ENCOUNTER — Inpatient Hospital Stay (HOSPITAL_COMMUNITY): Payer: 59

## 2022-02-20 ENCOUNTER — Emergency Department (HOSPITAL_COMMUNITY): Payer: 59

## 2022-02-20 ENCOUNTER — Encounter (HOSPITAL_COMMUNITY)
Admission: EM | Disposition: A | Discharge status: Home Health | Payer: Self-pay | Source: Home / Self Care | Attending: Internal Medicine

## 2022-02-20 ENCOUNTER — Encounter (HOSPITAL_COMMUNITY): Payer: Self-pay | Admitting: Pulmonary Disease

## 2022-02-20 DIAGNOSIS — R578 Other shock: Secondary | ICD-10-CM | POA: Diagnosis present

## 2022-02-20 DIAGNOSIS — L8990 Pressure ulcer of unspecified site, unspecified stage: Secondary | ICD-10-CM | POA: Insufficient documentation

## 2022-02-20 DIAGNOSIS — K7460 Unspecified cirrhosis of liver: Secondary | ICD-10-CM | POA: Diagnosis present

## 2022-02-20 DIAGNOSIS — N179 Acute kidney failure, unspecified: Secondary | ICD-10-CM | POA: Diagnosis present

## 2022-02-20 DIAGNOSIS — D61818 Other pancytopenia: Secondary | ICD-10-CM | POA: Diagnosis present

## 2022-02-20 DIAGNOSIS — E782 Mixed hyperlipidemia: Secondary | ICD-10-CM | POA: Diagnosis present

## 2022-02-20 DIAGNOSIS — E1165 Type 2 diabetes mellitus with hyperglycemia: Secondary | ICD-10-CM | POA: Diagnosis present

## 2022-02-20 DIAGNOSIS — K920 Hematemesis: Secondary | ICD-10-CM | POA: Diagnosis not present

## 2022-02-20 DIAGNOSIS — D62 Acute posthemorrhagic anemia: Secondary | ICD-10-CM | POA: Diagnosis present

## 2022-02-20 DIAGNOSIS — I8501 Esophageal varices with bleeding: Secondary | ICD-10-CM

## 2022-02-20 DIAGNOSIS — Z515 Encounter for palliative care: Secondary | ICD-10-CM | POA: Diagnosis not present

## 2022-02-20 DIAGNOSIS — N1832 Chronic kidney disease, stage 3b: Secondary | ICD-10-CM | POA: Diagnosis present

## 2022-02-20 DIAGNOSIS — K767 Hepatorenal syndrome: Secondary | ICD-10-CM | POA: Diagnosis present

## 2022-02-20 DIAGNOSIS — R188 Other ascites: Secondary | ICD-10-CM | POA: Diagnosis present

## 2022-02-20 DIAGNOSIS — D684 Acquired coagulation factor deficiency: Secondary | ICD-10-CM | POA: Diagnosis present

## 2022-02-20 DIAGNOSIS — K254 Chronic or unspecified gastric ulcer with hemorrhage: Secondary | ICD-10-CM | POA: Diagnosis not present

## 2022-02-20 DIAGNOSIS — G9340 Encephalopathy, unspecified: Secondary | ICD-10-CM | POA: Diagnosis not present

## 2022-02-20 DIAGNOSIS — I8511 Secondary esophageal varices with bleeding: Secondary | ICD-10-CM | POA: Insufficient documentation

## 2022-02-20 DIAGNOSIS — I129 Hypertensive chronic kidney disease with stage 1 through stage 4 chronic kidney disease, or unspecified chronic kidney disease: Secondary | ICD-10-CM | POA: Diagnosis present

## 2022-02-20 DIAGNOSIS — J9601 Acute respiratory failure with hypoxia: Secondary | ICD-10-CM | POA: Diagnosis present

## 2022-02-20 DIAGNOSIS — K7210 Chronic hepatic failure without coma: Secondary | ICD-10-CM | POA: Diagnosis not present

## 2022-02-20 DIAGNOSIS — K922 Gastrointestinal hemorrhage, unspecified: Secondary | ICD-10-CM | POA: Diagnosis present

## 2022-02-20 DIAGNOSIS — L89151 Pressure ulcer of sacral region, stage 1: Secondary | ICD-10-CM | POA: Diagnosis present

## 2022-02-20 DIAGNOSIS — D65 Disseminated intravascular coagulation [defibrination syndrome]: Secondary | ICD-10-CM | POA: Diagnosis present

## 2022-02-20 DIAGNOSIS — E871 Hypo-osmolality and hyponatremia: Secondary | ICD-10-CM | POA: Diagnosis present

## 2022-02-20 DIAGNOSIS — K766 Portal hypertension: Secondary | ICD-10-CM | POA: Diagnosis present

## 2022-02-20 DIAGNOSIS — E1122 Type 2 diabetes mellitus with diabetic chronic kidney disease: Secondary | ICD-10-CM | POA: Diagnosis present

## 2022-02-20 DIAGNOSIS — Z9911 Dependence on respirator [ventilator] status: Secondary | ICD-10-CM | POA: Diagnosis not present

## 2022-02-20 DIAGNOSIS — Z7189 Other specified counseling: Secondary | ICD-10-CM | POA: Diagnosis not present

## 2022-02-20 DIAGNOSIS — R0689 Other abnormalities of breathing: Secondary | ICD-10-CM | POA: Insufficient documentation

## 2022-02-20 DIAGNOSIS — D689 Coagulation defect, unspecified: Secondary | ICD-10-CM | POA: Diagnosis not present

## 2022-02-20 DIAGNOSIS — K7682 Hepatic encephalopathy: Secondary | ICD-10-CM | POA: Diagnosis present

## 2022-02-20 DIAGNOSIS — K7031 Alcoholic cirrhosis of liver with ascites: Secondary | ICD-10-CM | POA: Diagnosis not present

## 2022-02-20 DIAGNOSIS — E43 Unspecified severe protein-calorie malnutrition: Secondary | ICD-10-CM | POA: Insufficient documentation

## 2022-02-20 DIAGNOSIS — G928 Other toxic encephalopathy: Secondary | ICD-10-CM | POA: Diagnosis present

## 2022-02-20 DIAGNOSIS — N183 Chronic kidney disease, stage 3 unspecified: Secondary | ICD-10-CM | POA: Diagnosis not present

## 2022-02-20 HISTORY — PX: ESOPHAGEAL BANDING: SHX5518

## 2022-02-20 HISTORY — PX: ESOPHAGOGASTRODUODENOSCOPY (EGD) WITH PROPOFOL: SHX5813

## 2022-02-20 LAB — I-STAT ARTERIAL BLOOD GAS, ED
Acid-base deficit: 4 mmol/L — ABNORMAL HIGH (ref 0.0–2.0)
Bicarbonate: 19.3 mmol/L — ABNORMAL LOW (ref 20.0–28.0)
Calcium, Ion: 0.84 mmol/L — CL (ref 1.15–1.40)
HCT: 38 % — ABNORMAL LOW (ref 39.0–52.0)
Hemoglobin: 12.9 g/dL — ABNORMAL LOW (ref 13.0–17.0)
O2 Saturation: 100 %
Patient temperature: 93.8
Potassium: 3.8 mmol/L (ref 3.5–5.1)
Sodium: 131 mmol/L — ABNORMAL LOW (ref 135–145)
TCO2: 20 mmol/L — ABNORMAL LOW (ref 22–32)
pCO2 arterial: 25.9 mmHg — ABNORMAL LOW (ref 32–48)
pH, Arterial: 7.468 — ABNORMAL HIGH (ref 7.35–7.45)
pO2, Arterial: 218 mmHg — ABNORMAL HIGH (ref 83–108)

## 2022-02-20 LAB — COMPREHENSIVE METABOLIC PANEL
ALT: 49 U/L — ABNORMAL HIGH (ref 0–44)
AST: 57 U/L — ABNORMAL HIGH (ref 15–41)
Albumin: 3.5 g/dL (ref 3.5–5.0)
Alkaline Phosphatase: 81 U/L (ref 38–126)
Anion gap: 11 (ref 5–15)
BUN: 26 mg/dL — ABNORMAL HIGH (ref 8–23)
CO2: 29 mmol/L (ref 22–32)
Calcium: 9.1 mg/dL (ref 8.9–10.3)
Chloride: 93 mmol/L — ABNORMAL LOW (ref 98–111)
Creatinine, Ser: 2.3 mg/dL — ABNORMAL HIGH (ref 0.61–1.24)
GFR, Estimated: 30 mL/min — ABNORMAL LOW (ref >60)
Glucose, Bld: 198 mg/dL — ABNORMAL HIGH (ref 70–99)
Potassium: 4.8 mmol/L (ref 3.5–5.1)
Sodium: 133 mmol/L — ABNORMAL LOW (ref 135–145)
Total Bilirubin: 3.1 mg/dL — ABNORMAL HIGH (ref 0.3–1.2)
Total Protein: 6.7 g/dL (ref 6.5–8.1)

## 2022-02-20 LAB — GLUCOSE, CAPILLARY
Glucose-Capillary: 298 mg/dL — ABNORMAL HIGH (ref 70–99)
Glucose-Capillary: 284 mg/dL — ABNORMAL HIGH (ref 70–99)
Glucose-Capillary: 304 mg/dL — ABNORMAL HIGH (ref 70–99)
Glucose-Capillary: 233 mg/dL — ABNORMAL HIGH (ref 70–99)
Glucose-Capillary: 277 mg/dL — ABNORMAL HIGH (ref 70–99)

## 2022-02-20 LAB — CBC
HCT: 32.2 % — ABNORMAL LOW (ref 39.0–52.0)
Hemoglobin: 11 g/dL — ABNORMAL LOW (ref 13.0–17.0)
MCH: 34.8 pg — ABNORMAL HIGH (ref 26.0–34.0)
MCHC: 34.2 g/dL (ref 30.0–36.0)
MCV: 101.9 fL — ABNORMAL HIGH (ref 80.0–100.0)
Platelets: 52 10*3/uL — ABNORMAL LOW (ref 150–400)
RBC: 3.16 MIL/uL — ABNORMAL LOW (ref 4.22–5.81)
RDW: 14.3 % (ref 11.5–15.5)
WBC: 5.4 10*3/uL (ref 4.0–10.5)
nRBC: 0 % (ref 0.0–0.2)

## 2022-02-20 LAB — HEMOGLOBIN AND HEMATOCRIT, BLOOD
HCT: 39.9 % (ref 39.0–52.0)
HCT: 37.1 % — ABNORMAL LOW (ref 39.0–52.0)
HCT: 31.2 % — ABNORMAL LOW (ref 39.0–52.0)
HCT: 29.9 % — ABNORMAL LOW (ref 39.0–52.0)
Hemoglobin: 13.8 g/dL (ref 13.0–17.0)
Hemoglobin: 13.5 g/dL (ref 13.0–17.0)
Hemoglobin: 11.1 g/dL — ABNORMAL LOW (ref 13.0–17.0)
Hemoglobin: 10.6 g/dL — ABNORMAL LOW (ref 13.0–17.0)

## 2022-02-20 LAB — PROTIME-INR
INR: 1.6 — ABNORMAL HIGH (ref 0.8–1.2)
INR: >10 (ref 0.8–1.2)
Prothrombin Time: 18.4 seconds — ABNORMAL HIGH (ref 11.4–15.2)
Prothrombin Time: >90 seconds — ABNORMAL HIGH (ref 11.4–15.2)

## 2022-02-20 LAB — FIBRINOGEN: Fibrinogen: <60 mg/dL — CL (ref 210–475)

## 2022-02-20 LAB — PREPARE RBC (CROSSMATCH)

## 2022-02-20 LAB — MASSIVE TRANSFUSION PROTOCOL ORDER (BLOOD BANK NOTIFICATION)

## 2022-02-20 LAB — APTT: aPTT: >200 seconds (ref 24–36)

## 2022-02-20 LAB — MRSA NEXT GEN BY PCR, NASAL: MRSA by PCR Next Gen: NOT DETECTED

## 2022-02-20 SURGERY — ESOPHAGOGASTRODUODENOSCOPY (EGD) WITH PROPOFOL
Anesthesia: General

## 2022-02-20 MED ORDER — FENTANYL CITRATE PF 50 MCG/ML IJ SOSY
50.0000 ug | PREFILLED_SYRINGE | INTRAMUSCULAR | Status: DC | PRN
  Filled 2022-02-20: qty 1

## 2022-02-20 MED ORDER — CALCIUM GLUCONATE-NACL 2-0.675 GM/100ML-% IV SOLN
2.0000 g | Freq: Once | INTRAVENOUS | Status: AC
  Administered 2022-02-20: 2000 mg via INTRAVENOUS
  Filled 2022-02-20: qty 100

## 2022-02-20 MED ORDER — SODIUM CHLORIDE 0.9% FLUSH
10.0000 mL | Freq: Two times a day (BID) | INTRAVENOUS | Status: DC
  Administered 2022-02-20 – 2022-03-01 (×15): 10 mL

## 2022-02-20 MED ORDER — LACTATED RINGERS IV SOLN: INTRAVENOUS | Status: DC

## 2022-02-20 MED ORDER — SODIUM CHLORIDE 0.9% IV SOLUTION: Freq: Once | INTRAVENOUS | Status: AC

## 2022-02-20 MED ORDER — ALBUMIN HUMAN 5 % IV SOLN
25.0000 g | Freq: Once | INTRAVENOUS | Status: AC
  Administered 2022-02-20: 25 g via INTRAVENOUS
  Filled 2022-02-20: qty 500

## 2022-02-20 MED ORDER — TERBINAFINE HCL 1 % EX CREA
TOPICAL_CREAM | Freq: Two times a day (BID) | CUTANEOUS | Status: DC
  Administered 2022-02-22 – 2022-02-23 (×3): 1 via TOPICAL
  Filled 2022-02-20 (×2): qty 12

## 2022-02-20 MED ORDER — SODIUM CHLORIDE 0.9 % IV SOLN: INTRAVENOUS | Status: DC | PRN

## 2022-02-20 MED ORDER — POLYETHYLENE GLYCOL 3350 17 G PO PACK: 17.0000 g | PACK | Freq: Every day | ORAL | Status: DC

## 2022-02-20 MED ORDER — SODIUM CHLORIDE 0.9 % IV SOLN
50.0000 ug/h | INTRAVENOUS | Status: AC
  Administered 2022-02-20 – 2022-02-22 (×7): 50 ug/h via INTRAVENOUS
  Filled 2022-02-20 (×8): qty 1

## 2022-02-20 MED ORDER — SODIUM CHLORIDE 0.9 % IV BOLUS
500.0000 mL | Freq: Once | INTRAVENOUS | Status: AC
  Administered 2022-02-20: 500 mL via INTRAVENOUS

## 2022-02-20 MED ORDER — SODIUM CHLORIDE 0.9 % IV SOLN: INTRAVENOUS | Status: DC

## 2022-02-20 MED ORDER — PROPOFOL 1000 MG/100ML IV EMUL
5.0000 ug/kg/min | INTRAVENOUS | Status: DC
  Administered 2022-02-20: 5 ug/kg/min via INTRAVENOUS
  Administered 2022-02-20 – 2022-02-22 (×6): 30 ug/kg/min via INTRAVENOUS
  Filled 2022-02-20 (×6): qty 100

## 2022-02-20 MED ORDER — NOREPINEPHRINE 4 MG/250ML-% IV SOLN
0.0000 ug/min | INTRAVENOUS | Status: DC
  Administered 2022-02-20: 8 ug/min via INTRAVENOUS
  Administered 2022-02-20 – 2022-02-21 (×2): 2 ug/min via INTRAVENOUS
  Filled 2022-02-20 (×2): qty 250
  Filled 2022-02-20: qty 500

## 2022-02-20 MED ORDER — FENTANYL CITRATE PF 50 MCG/ML IJ SOSY
50.0000 ug | PREFILLED_SYRINGE | INTRAMUSCULAR | Status: DC | PRN
  Administered 2022-02-20 (×2): 200 ug via INTRAVENOUS
  Filled 2022-02-20 (×2): qty 4

## 2022-02-20 MED ORDER — MIDAZOLAM HCL 2 MG/2ML IJ SOLN
2.0000 mg | INTRAMUSCULAR | Status: DC | PRN
  Administered 2022-02-20 (×2): 2 mg via INTRAVENOUS
  Filled 2022-02-20 (×2): qty 2

## 2022-02-20 MED ORDER — ORAL CARE MOUTH RINSE: 15.0000 mL | OROMUCOSAL | Status: DC | PRN

## 2022-02-20 MED ORDER — PANTOPRAZOLE INFUSION (NEW) - SIMPLE MED
8.0000 mg/h | INTRAVENOUS | Status: AC
  Administered 2022-02-20 – 2022-02-22 (×10): 8 mg/h via INTRAVENOUS
  Filled 2022-02-20 (×9): qty 100

## 2022-02-20 MED ORDER — INSULIN ASPART 100 UNIT/ML IJ SOLN
0.0000 [IU] | INTRAMUSCULAR | Status: DC
  Administered 2022-02-20 (×2): 5 [IU] via SUBCUTANEOUS
  Administered 2022-02-20: 7 [IU] via SUBCUTANEOUS
  Administered 2022-02-20 – 2022-02-21 (×2): 3 [IU] via SUBCUTANEOUS
  Administered 2022-02-21: 2 [IU] via SUBCUTANEOUS
  Administered 2022-02-21 (×2): 3 [IU] via SUBCUTANEOUS
  Administered 2022-02-21: 2 [IU] via SUBCUTANEOUS
  Administered 2022-02-21: 3 [IU] via SUBCUTANEOUS
  Administered 2022-02-22 – 2022-02-23 (×8): 2 [IU] via SUBCUTANEOUS
  Administered 2022-02-23: 3 [IU] via SUBCUTANEOUS
  Administered 2022-02-23: 2 [IU] via SUBCUTANEOUS
  Administered 2022-02-23: 3 [IU] via SUBCUTANEOUS
  Administered 2022-02-24: 2 [IU] via SUBCUTANEOUS
  Administered 2022-02-24 – 2022-02-25 (×2): 1 [IU] via SUBCUTANEOUS
  Administered 2022-02-25: 2 [IU] via SUBCUTANEOUS
  Administered 2022-02-25: 3 [IU] via SUBCUTANEOUS

## 2022-02-20 MED ORDER — MIDAZOLAM HCL 2 MG/2ML IJ SOLN
2.0000 mg | INTRAMUSCULAR | Status: DC | PRN
  Administered 2022-02-20: 2 mg via INTRAVENOUS
  Filled 2022-02-20: qty 2

## 2022-02-20 MED ORDER — ORAL CARE MOUTH RINSE
15.0000 mL | OROMUCOSAL | Status: DC
  Administered 2022-02-20 – 2022-02-21 (×12): 15 mL via OROMUCOSAL

## 2022-02-20 MED ORDER — SODIUM CHLORIDE 0.9 % IV SOLN
2.0000 g | Freq: Once | INTRAVENOUS | Status: AC
  Administered 2022-02-20: 2 g via INTRAVENOUS
  Filled 2022-02-20: qty 20

## 2022-02-20 MED ORDER — FENTANYL CITRATE (PF) 100 MCG/2ML IJ SOLN
INTRAMUSCULAR | Status: AC
  Filled 2022-02-20: qty 4

## 2022-02-20 MED ORDER — MIDAZOLAM HCL (PF) 5 MG/ML IJ SOLN
INTRAMUSCULAR | Status: AC
  Filled 2022-02-20: qty 2

## 2022-02-20 MED ORDER — PROPOFOL 1000 MG/100ML IV EMUL
INTRAVENOUS | Status: AC
  Filled 2022-02-20: qty 100

## 2022-02-20 MED ORDER — CHLORHEXIDINE GLUCONATE CLOTH 2 % EX PADS
6.0000 | MEDICATED_PAD | Freq: Every day | CUTANEOUS | Status: DC
  Administered 2022-02-20 – 2022-02-26 (×7): 6 via TOPICAL

## 2022-02-20 MED ORDER — SODIUM CHLORIDE 0.9% FLUSH
10.0000 mL | INTRAVENOUS | Status: DC | PRN
  Administered 2022-02-28: 10 mL

## 2022-02-20 MED ORDER — PANTOPRAZOLE 80MG IVPB - SIMPLE MED
80.0000 mg | Freq: Once | INTRAVENOUS | Status: AC
  Administered 2022-02-20: 80 mg via INTRAVENOUS
  Filled 2022-02-20: qty 100

## 2022-02-20 MED ORDER — ROCURONIUM BROMIDE 10 MG/ML (PF) SYRINGE
PREFILLED_SYRINGE | INTRAVENOUS | Status: AC
  Administered 2022-02-20: 100 mg
  Filled 2022-02-20: qty 10

## 2022-02-20 MED ORDER — DOCUSATE SODIUM 50 MG/5ML PO LIQD: 100.0000 mg | Freq: Two times a day (BID) | ORAL | Status: DC | PRN

## 2022-02-20 MED ORDER — ONDANSETRON HCL 4 MG/2ML IJ SOLN: 4.0000 mg | Freq: Once | INTRAMUSCULAR | Status: DC | PRN

## 2022-02-20 MED ORDER — POLYETHYLENE GLYCOL 3350 17 G PO PACK: 17.0000 g | PACK | Freq: Every day | ORAL | Status: DC | PRN

## 2022-02-20 MED ORDER — OCTREOTIDE LOAD VIA INFUSION
50.0000 ug | Freq: Once | INTRAVENOUS | Status: AC
  Administered 2022-02-20: 50 ug via INTRAVENOUS
  Filled 2022-02-20: qty 25

## 2022-02-20 MED ORDER — PANTOPRAZOLE SODIUM 40 MG IV SOLR
40.0000 mg | Freq: Two times a day (BID) | INTRAVENOUS | Status: DC
  Administered 2022-02-23 – 2022-02-27 (×10): 40 mg via INTRAVENOUS
  Filled 2022-02-20 (×10): qty 10

## 2022-02-20 MED ORDER — SODIUM CHLORIDE 0.9% IV SOLUTION: Freq: Once | INTRAVENOUS | Status: DC

## 2022-02-20 MED ORDER — DOCUSATE SODIUM 50 MG/5ML PO LIQD: 100.0000 mg | Freq: Two times a day (BID) | ORAL | Status: DC

## 2022-02-20 MED ORDER — NOREPINEPHRINE 4 MG/250ML-% IV SOLN
INTRAVENOUS | Status: AC
  Filled 2022-02-20: qty 250

## 2022-02-20 MED ORDER — ETOMIDATE 2 MG/ML IV SOLN
INTRAVENOUS | Status: AC
  Administered 2022-02-20: 10 mg
  Filled 2022-02-20: qty 10

## 2022-02-20 MED ORDER — SODIUM CHLORIDE 0.9 % IV BOLUS
1000.0000 mL | Freq: Once | INTRAVENOUS | Status: AC
  Administered 2022-02-20: 1000 mL via INTRAVENOUS

## 2022-02-20 MED ORDER — SODIUM CHLORIDE 0.9 % IV SOLN
2.0000 g | INTRAVENOUS | Status: DC
  Administered 2022-02-20: 2 g via INTRAVENOUS
  Filled 2022-02-20: qty 20

## 2022-02-20 MED ORDER — ONDANSETRON HCL 4 MG/2ML IJ SOLN
4.0000 mg | Freq: Once | INTRAMUSCULAR | Status: AC
  Administered 2022-02-20: 4 mg via INTRAVENOUS
  Filled 2022-02-20: qty 2

## 2022-02-20 MED ORDER — OCTREOTIDE ACETATE 500 MCG/ML IJ SOLN
INTRAMUSCULAR | Status: AC
  Filled 2022-02-20: qty 1

## 2022-02-20 SURGICAL SUPPLY — 15 items

## 2022-02-20 NOTE — ED Provider Notes (Signed)
[facility] Emergency Department Provider Note MRN:  [id number]  Arrival date & time: 02/20/22     Chief Complaint   Extremity Pain and extremity cramping   History of Present Illness   Edward [name] is a 71 y.o. year-old male with a history of CAD, CKD, cirrhosis, diabetes presenting to the ED with chief complaint of cramping.  Cramping to the arms and legs as well as the feet and hands.  Sometimes his fingers get stuck in a painful position and he has to pry them open with the other hand.  The symptoms bothering him for the past several days.  No leg swelling, no chest pain, no abdominal pain.  Had a paracentesis earlier today.  Review of Systems  A thorough review of systems was obtained and all systems are negative except as noted in the HPI and PMH.   Patient's Health History    Past Medical History:  Diagnosis Date   Cataract    CKD (chronic kidney disease) stage 3, GFR 30-59 ml/min (HCC)    Coronary atherosclerosis of native coronary artery    BMS LAD 2006; residual 75% distal RCA; EF 50%   Essential hypertension    Glaucoma    Low back pain    MI (myocardial infarction) ([place])    Anterior 2006   Mixed hyperlipidemia    Renal insufficiency    Type 2 diabetes mellitus ([place])     Past Surgical History:  Procedure Laterality Date   CATARACT EXTRACTION, BILATERAL     CORONARY ANGIOPLASTY WITH STENT PLACEMENT  2006   ESOPHAGEAL BANDING  02/01/2022   Procedure: ESOPHAGEAL BANDING;  Surgeon: Eloise Harman, DO;  [facility];  Service: Endoscopy;;   ESOPHAGOGASTRODUODENOSCOPY (EGD) WITH PROPOFOL N/A 02/01/2022   Procedure: ESOPHAGOGASTRODUODENOSCOPY (EGD) WITH PROPOFOL;  Surgeon: Eloise Harman, DO;  [facility];  Service: Endoscopy;  Laterality: N/A;  12:30 am    Family History  Problem Relation Age of Onset   Aneurysm Father        Brain aneurysm   Lung cancer Mother        Small cell carcinoma of lung,kidney and  heart   Heart disease Mother    Cancer Brother        lungs   Diabetes Sister    Diabetes Sister    Diabetes Brother    Diabetes Brother     Social History   Socioeconomic History   Marital status: Legally Separated    Spouse name: Edward [name]    Number of children: 2   Years of education: 7th   Highest education level: 7th grade  Occupational History   Occupation: Retired  Tobacco Use   Smoking status: Former    Packs/day: 2.00    Years: 20.00    Total pack years: 40.00    Types: Cigarettes    Quit date: 05/08/1987    Years since quitting: 34.8    Passive exposure: Past   Smokeless tobacco: Never  Vaping Use   Vaping Use: Never used  Substance and Sexual Activity   Alcohol use: No    Alcohol/week: 0.0 standard drinks of alcohol   Drug use: No   Sexual activity: Not Currently  Other Topics Concern   Not on file  Social History Narrative   Lives alone    Social Determinants of Health   Financial Resource Strain: Low Risk  (07/11/2021)   Overall Financial Resource Strain (CARDIA)    Difficulty of Paying  Living Expenses: Not very hard  Food Insecurity: Unknown (02/13/2022)   Hunger Vital Sign    Worried About Running Out of Food in the Last Year: Patient refused    [place] in the Last Year: Patient refused  Transportation Needs: Unknown (02/13/2022)   [place] - Hydrologist (Medical): Patient refused    Lack of Transportation (Non-Medical): Patient refused  Physical Activity: Insufficiently Active (07/11/2021)   Exercise Vital Sign    Days of Exercise per Week: 7 days    Minutes of Exercise per Session: 20 min  Stress: No Stress Concern Present (07/11/2021)   [place]    Feeling of Stress : Not at all  Social Connections: Socially Isolated (07/11/2021)   Social Connection and Isolation Panel [NHANES]    Frequency of Communication with Friends and Family: More than  three times a week    Frequency of Social Gatherings with Friends and Family: More than three times a week    Attends Religious Services: Never    Marine scientist or Organizations: No    Attends Archivist Meetings: Never    Marital Status: Separated  Intimate Partner Violence: Unknown (02/13/2022)   Humiliation, Afraid, Rape, and Kick questionnaire    Fear of Current or Ex-Partner: Patient refused    Emotionally Abused: Patient refused    Physically Abused: Patient refused    Sexually Abused: Patient refused     Physical Exam   Vitals:   02/20/22 0445 02/20/22 0500  BP: 127/75 127/72  Pulse: (!) 103 (!) 111  Resp: 16 (!) 24  Temp: (!) 96.4 F (35.8 C) (!) 95.9 F (35.5 C)  SpO2: 100% 100%    CONSTITUTIONAL: Chronically ill-appearing, NAD NEURO/PSYCH:  Alert and oriented x 3, no focal deficits EYES:  eyes equal and reactive ENT/NECK:  no LAD, no JVD CARDIO: Regular rate, well-perfused, normal S1 and S2 PULM:  CTAB no wheezing or rhonchi GI/GU:  non-distended, non-tender MSK/SPINE:  No gross deformities, no edema SKIN:  no rash, atraumatic   *Additional and/or pertinent findings included in MDM below  Diagnostic and Interventional Summary    EKG Interpretation  Date/Time:    Ventricular Rate:    PR Interval:    QRS Duration:   QT Interval:    QTC Calculation:   R Axis:     Text Interpretation:         Labs Reviewed  CBC - Abnormal; Notable for the following components:      Result Value   RBC 3.16 (*)    Hemoglobin 11.0 (*)    HCT 32.2 (*)    MCV 101.9 (*)    MCH 34.8 (*)    Platelets 52 (*)    All other components within normal limits  COMPREHENSIVE METABOLIC PANEL - Abnormal; Notable for the following components:   Sodium 133 (*)    Chloride 93 (*)    Glucose, Bld 198 (*)    BUN 26 (*)    Creatinine, Ser 2.30 (*)    AST 57 (*)    ALT 49 (*)    Total Bilirubin 3.1 (*)    GFR, Estimated 30 (*)    All other components within  normal limits  PROTIME-INR - Abnormal; Notable for the following components:   Prothrombin Time 18.4 (*)    INR 1.6 (*)    All other components within normal limits  TYPE AND SCREEN  MASSIVE TRANSFUSION PROTOCOL ORDER (BLOOD BANK NOTIFICATION)  PREPARE PLATELET PHERESIS  PREPARE FRESH FROZEN PLASMA  ABO/RH    DG Chest Port 1 View  Final Result      Medications  pantoprozole (PROTONIX) 80 mg /NS 100 mL infusion (0 mg/hr Intravenous Stopped 02/20/22 0414)  pantoprazole (PROTONIX) injection 40 mg (has no administration in time range)  octreotide (SANDOSTATIN) 2 mcg/mL load via infusion 50 mcg (50 mcg Intravenous Bolus from Bag 02/20/22 0330)    And  octreotide (SANDOSTATIN) 500 mcg in sodium chloride 0.9 % 250 mL (2 mcg/mL) infusion (50 mcg/hr Intravenous Infusion Verify 02/20/22 0418)  sodium chloride 0.9 % bolus 500 mL (0 mLs Intravenous Stopped 02/20/22 0347)  ondansetron (ZOFRAN) injection 4 mg (4 mg Intravenous Given 02/20/22 0320)  cefTRIAXone (ROCEPHIN) 2 g in sodium chloride 0.9 % 100 mL IVPB (0 g Intravenous Stopped 02/20/22 0347)  pantoprazole (PROTONIX) 80 mg /NS 100 mL IVPB (0 mg Intravenous Stopped 02/20/22 0404)  rocuronium bromide 100 MG/10ML SOSY (100 mg  Given 02/20/22 0344)  etomidate (AMIDATE) 2 MG/ML injection (10 mg  Given 02/20/22 0344)  norepinephrine (LEVOPHED) 4-5 MG/250ML-% infusion SOLN (  New Bag/Given 02/20/22 0344)     Procedures  /  Critical Care .Critical Care  Performed by: Maudie Flakes, MD Authorized by: Maudie Flakes, MD   Critical care provider statement:    Critical care time (minutes):  82   Critical care was necessary to treat or prevent imminent or life-threatening deterioration of the following conditions: Hemorrhagic shock, variceal bleeding.   Critical care was time spent personally by me on the following activities:  Development of treatment plan with patient or surrogate, discussions with consultants, evaluation of patient's  response to treatment, examination of patient, ordering and review of laboratory studies, ordering and review of radiographic studies, ordering and performing treatments and interventions, pulse oximetry, re-evaluation of patient's condition and review of old charts Procedure Name: Intubation Date/Time: 02/20/2022 5:21 AM  Performed by: Maudie Flakes, MDPre-anesthesia Checklist: Patient identified, Patient being monitored, Emergency Drugs available, Timeout performed and Suction available Oxygen Delivery Method: Non-rebreather mask Preoxygenation: Pre-oxygenation with 100% oxygen Induction Type: Rapid sequence Ventilation: Mask ventilation without difficulty Laryngoscope Size: Glidescope and 4 Grade View: Grade I Number of attempts: 1 Airway Equipment and Method: Rigid stylet Placement Confirmation: ETT inserted through vocal cords under direct vision, CO2 detector and Breath sounds checked- equal and bilateral Secured at: 25 cm Tube secured with: ETT holder Difficulty Due To: Difficulty was anticipated Comments: Intubating in the setting of hemorrhagic shock, and so pretreated with a push dose of epinephrine, 1 cc of the epinephrine from the code cart.  RSI intubation with 10 mg etomidate, 100 mg rocuronium.  Difficulty anticipated due to bloody airway.      ED Course and Medical Decision Making  Initial Impression and Ddx Suspicious for electrolyte disturbance, dehydration.  Well-appearing with normal vital signs, not currently in any distress or having cramps.  Will check screening labs.  Past medical/surgical history that increases complexity of ED encounter: Cirrhosis  Interpretation of Diagnostics I personally reviewed the laboratory assessment and my interpretation is as follows: Minimal increasing creatinine, otherwise no significant blood count or electrolyte disturbance    Patient Reassessment and Ultimate Disposition/Management     I was informed by nursing staff  that patient began suddenly experiencing hematemesis.  Large-volume and persistent.  Given patient's history of cirrhosis, there is concern for variceal bleeding.  Patient has known variceal disease per recent  EGD last month.  Patient provided with Protonix, octreotide, ceftriaxone, empirically given emergent release blood given his clinical status, pallor, diminishing mental status, diminished radial pulse.  Patient became profoundly hypotensive and so massive transfusion protocol was initiated.  Improved blood pressure after a few units of blood, intubated for airway protection.  At one point there was concern for this facility in [place] running out of available blood for the patient and so I attempted to facilitate rapid and emergent transport to [facility] emergency department.  Unfortunately local ambulance service unwilling to transport the patient, stating they do not have the proper vent for the patient.  Dr. Eulas Post of gastroenterology recommending Edward [name] transfer for treatment.  Dr. Lorenso Courier of gastroenterology also consulted and will provide any procedural needs.  Awaiting callback from intensivist service.  Patient has received 7 units of blood, a unit of platelets, 500cc crystalloid.  Patient management required discussion with the following services or consulting groups:  Intensivist Service and Gastroenterology  Complexity of Problems Addressed Acute illness or injury that poses threat of life of bodily function  Additional Data Reviewed and Analyzed Further history obtained from: Further history from spouse/family member  Additional Factors Impacting ED Encounter Risk Consideration of hospitalization  Edward [name]. Sedonia Small, MD [place] [email or web address]  Final Clinical Impressions(s) / ED Diagnoses     ICD-10-CM   1. Hematemesis, unspecified whether nausea present  K92.0     2. Bleeding esophageal varices, unspecified  esophageal varices type (HCC)  I85.01     3. Hemorrhagic shock ([place])  R57.8       ED Discharge Orders     None        Discharge Instructions Discussed with and Provided to Patient:   Discharge Instructions   None      Maudie Flakes, MD 02/20/22 [phone]

## 2022-02-20 NOTE — H&P (Addendum)
NAME:  Edward [name], MRN:  [id number], DOB:  Oct 04, 1950, LOS: 0 ADMISSION DATE:  02/20/2022, CONSULTATION DATE:  02/20/22 REFERRING MD:  Karle Starch CHIEF COMPLAINT:  GIB   History of Present Illness:  Edward [name] is a 71 y.o. male who has a PMH as below including but not limited to cirrhosis with ascites undergoing serial paracenteses, portal hypertension with paraesophageal and perigastric varices (s/p esophageal banding 02/01/22 after surveillance EGD), concern for Wilson's disease.  He was apparently evaluated at [facility] for transplant but was deemed to not be a candidate. he presented to [facility] 10/16 with cramping of the hands and feet.  He had had a paracentesis earlier that day with 4 L removed (see below).  While in ED, he began to spit up bright red blood.  This progressed to frank hematemesis, large-volume and persistent.  He was started on Protonix, octreotide, ceftriaxone.  He then became hypotensive; therefore, massive transfusion protocol was initiated.  He received 8 units PRBC, 2 units FFP, 1 unit platelets.  He was intubated for airway protection was also started on Levophed.  Of note, he had an EGD 9/28 that showed no active bleeding but did show esophageal and gastric varices with banding of 3 esophageal varices.  To large-volume hematemesis and hemodynamic instability, he was transferred to [facility] for further evaluation by intensivist and GI services.  GI was consulted and agreed to scope him, this is still pending.  His repeat hemoglobin following transfusion is 12.9 (drawn around 0630).  He is currently on 5 of Levophed and has stable vital signs.  He does have an OG tube in place and has ongoing bleeding with bright red blood.  OG tube on low intermittent suction.  Wife denies any antiplatelet, NSAID, anticoagulation.  He was previously on aspirin; however, this was discontinued by cardiology due to progressive liver disease. He does not drink alcohol anymore, quit some 30 year ago  but per wife, was not heavy drinker prior to that.  He had recent admission 02/13/2022 through 02/14/2022 for dyspnea and abdominal distention secondary to ascites.  He underwent paracentesis/11 with 3.4 L removed.  Fluid was negative for SBP.  He was discharged with instructions for repeat paracentesis as an outpatient following discharge.  Per notes, it appears that he underwent repeat paracentesis on 10/16 with 4 L removed.  Pertinent  Medical History:  has Mixed hyperlipidemia; Coronary atherosclerosis of native coronary artery; CKD (chronic kidney disease) stage 3B, GFR 30-59 ml/min ([place]); Insomnia; Essential hypertension; Gastroesophageal reflux disease without esophagitis; Type 2 diabetes with nephropathy ([place]); Abnormal liver function tests; Gallstones with acute cholecystitis; Acute Calculous cholecystitis with Elevated LFTs; Thrombocytopenia ([place]); Iron deficiency anemia; Non-traumatic rhabdomyolysis; Decompensated hepatic cirrhosis ([place]); Shortness of breath; Other ascites; Abnormal CT scan, esophagus; Chronic cholecystitis; Pneumonia; Hypoglycemia; Anasarca; Hypoalbuminemia; AKI (acute kidney injury) ([place]); Massive volume overload; Hypokalemia; Secondary esophageal varices without bleeding ([place]); Fever; Sepsis secondary to UTI ([place]); Cirrhosis of liver with ascites ([place]); Anemia of chronic disease; and GI bleed on their problem list.  Significant Hospital Events: Including procedures, antibiotic start and stop dates in addition to other pertinent events   10/17 admit, GI planning for EGD  Interim History / Subjective:  On vent, no sedation. Does open eyes and follow basic commands.  Objective:  Blood pressure 109/74, pulse (!) 130, temperature (!) 94.9 F (34.9 C), resp. rate 16, height '5\' 10"'$  (1.778 m), weight 64.4 kg, SpO2 99 %.    Vent Mode: PRVC FiO2 (%):  [40 %-  60 %] 40 % Set Rate:  [16 bmp] 16 bmp Vt Set:  [580 mL] 580 mL PEEP:  [5 cmH20] 5 cmH20 Plateau Pressure:  [13  cmH20-17 cmH20] 17 cmH20   Intake/Output Summary (Last 24 hours) at 02/20/2022 0750 Last data filed at 02/20/2022 0452 Gross per 24 hour  Intake 4258.79 ml  Output 1250 ml  Net 3008.79 ml   Filed Weights   02/19/22 2158  Weight: 64.4 kg    Examination: General: WDWN male, critically ill, in NAD. Neuro: Sedated, opens eyes to voice, follows basic commands. HEENT: [place]/AT. Sclerae anicteric. ETT in place, OGT in place with bright red blood. Cardiovascular: RRR, no M/R/G.  Lungs: Respirations even and unlabored.  CTA bilaterally, No W/R/R. Abdomen: BS x 4, soft, NT/ND.  Musculoskeletal: No gross deformities, no edema.  Skin: Intact, warm, no rashes.  Labs/imaging personally reviewed:  EGD 10/17 >   Assessment & Plan:   Large volume upper GI bleed -presumed variceal.  Of note, he is s/p EGD 9/28 with banding of 3 esophageal varices (non-bleeding). Acute blood loss anemia -secondary to above. S/p 8u PRBC. Thrombocytopenia - chronic, s/p 1u platelets. Hx Cirrhosis with ascites (unclear etiology), concern for Wilsons disease (low ceruloplasmin and elevated 24 hour urine copper - GI consulted, planning for emergent EGD. - Continue PPI gtt, octreotide. - Follow H&H, transfuse for Hgb < 7.  Hemorrhagic shock - 2/2 above. - Continue Levophed, goal MAP >65. - Continue fluids and as needed blood products. - Resume home Midodrine when able to use OGT for meds.  Respiratory insufficiency with inability to protect the airway - 2/2 above. - Full vent support. - No weaning until GIB stabilized and controlled. - Bronchial hygiene. - Follow CXR.  AoCKD - ? Hepatorenal. Hypocalcemia. - Continue supportive care. - 2g Calcium Gluconate. - Follow BMP.  Hx CAD, MI HTN, HLD. - Hold home Furosemide, Midodrine, Nitro, Spironolactone.  Hx DM. - SSI. - Hold home Linagliptin.   Best practice (evaluated daily):  Diet/type: NPO DVT prophylaxis: not indicated GI prophylaxis: PPI Lines:  N/A Foley:  N/A Code Status:  full code Last date of multidisciplinary goals of care discussion: None yet.  Labs   CBC: Recent Labs  Lab 02/13/22 1450 02/14/22 0408 02/20/22 0142 02/20/22 0600 02/20/22 0638  WBC 5.0 4.0 5.4  --   --   NEUTROABS 3.7  --   --   --   --   HGB 11.3* 9.6* 11.0* 13.8 12.9*  HCT 32.0* 27.2* 32.2* 39.9 38.0*  MCV 100.3* 100.4* 101.9*  --   --   PLT 74* 68* 52*  --   --     Basic Metabolic Panel: Recent Labs  Lab 02/13/22 1450 02/14/22 0408 02/20/22 0142 02/20/22 0638  NA 129* 130* 133* 131*  K 4.0 3.5 4.8 3.8  CL 91* 95* 93*  --   CO2 '26 26 29  '$ --   GLUCOSE 333* 181* 198*  --   BUN 30* 30* 26*  --   CREATININE 2.10* 1.87* 2.30*  --   CALCIUM 8.6* 8.3* 9.1  --    GFR: Estimated Creatinine Clearance: 27.6 mL/min (A) (by C-G formula based on SCr of 2.3 mg/dL (H)). Recent Labs  Lab 02/13/22 1450 02/14/22 0408 02/20/22 0142  WBC 5.0 4.0 5.4    Liver Function Tests: Recent Labs  Lab 02/13/22 1450 02/14/22 0408 02/20/22 0142  AST 84* 59* 57*  ALT 75* 58* 49*  ALKPHOS 90 68 81  BILITOT 3.9*  3.8* 3.1*  PROT 6.3* 5.3* 6.7  ALBUMIN 3.0* 2.5* 3.5   Recent Labs  Lab 02/13/22 1450  LIPASE 81*   No results for input(s): "AMMONIA" in the last 168 hours.  ABG    Component Value Date/Time   PHART 7.468 (H) 02/20/2022 0638   PCO2ART 25.9 (L) 02/20/2022 0638   PO2ART 218 (H) 02/20/2022 0638   HCO3 19.3 (L) 02/20/2022 0638   TCO2 20 (L) 02/20/2022 0638   ACIDBASEDEF 4.0 (H) 02/20/2022 0638   O2SAT 100 02/20/2022 0638     Coagulation Profile: Recent Labs  Lab 02/13/22 1920 02/20/22 0142  INR 1.5* 1.6*    Cardiac Enzymes: No results for input(s): "CKTOTAL", "CKMB", "CKMBINDEX", "TROPONINI" in the last 168 hours.  HbA1C: HB A1C (BAYER DCA - WAIVED)  Date/Time Value Ref Range Status  11/14/2021 08:18 AM 6.7 (H) 4.8 - 5.6 % Final    Comment:             Prediabetes: 5.7 - 6.4          Diabetes: >6.4          Glycemic  control for adults with diabetes: <7.0   08/15/2021 09:01 AM 7.3 (H) 4.8 - 5.6 % Final    Comment:             Prediabetes: 5.7 - 6.4          Diabetes: >6.4          Glycemic control for adults with diabetes: <7.0    Hgb A1c MFr Bld  Date/Time Value Ref Range Status  11/27/2021 03:00 PM 6.5 (H) 4.8 - 5.6 % Final    Comment:    (NOTE) Pre diabetes:          5.7%-6.4%  Diabetes:              >6.4%  Glycemic control for   <7.0% adults with diabetes     CBG: Recent Labs  Lab 02/13/22 1953 02/13/22 2348 02/14/22 0537 02/14/22 0717 02/14/22 1121  GLUCAP 262* 220* 164* 147* 156*    Review of Systems:   Unable to obtain as pt is encephalopathic.  Past Medical History:  He,  has a past medical history of Cataract, CKD (chronic kidney disease) stage 3, GFR 30-59 ml/min ([facility]), Coronary atherosclerosis of native coronary artery, Essential hypertension, Glaucoma, Low back pain, MI (myocardial infarction) ([place]), Mixed hyperlipidemia, Renal insufficiency, and Type 2 diabetes mellitus ([place]).   Surgical History:   Past Surgical History:  Procedure Laterality Date   CATARACT EXTRACTION, BILATERAL     CORONARY ANGIOPLASTY WITH STENT PLACEMENT  2006   ESOPHAGEAL BANDING  02/01/2022   Procedure: ESOPHAGEAL BANDING;  Surgeon: Eloise Harman, DO;  Location: [facility];  Service: Endoscopy;;   ESOPHAGOGASTRODUODENOSCOPY (EGD) WITH PROPOFOL N/A 02/01/2022   Procedure: ESOPHAGOGASTRODUODENOSCOPY (EGD) WITH PROPOFOL;  Surgeon: Eloise Harman, DO;  Location: [facility];  Service: Endoscopy;  Laterality: N/A;  12:30 am     Social History:   reports that he quit smoking about 34 years ago. His smoking use included cigarettes. He has a 40.00 pack-year smoking history. He has been exposed to tobacco smoke. He has never used smokeless tobacco. He reports that he does not drink alcohol and does not use drugs.   Family History:  His family history includes Aneurysm in his father;  Cancer in his brother; Diabetes in his brother, brother, sister, and sister; Heart disease in his mother; Lung cancer in his mother.  Allergies No Known Allergies   Home Medications  Prior to Admission medications   Medication Sig Start Date End Date Taking? Authorizing Provider  albuterol (VENTOLIN HFA) 108 (90 Base) MCG/ACT inhaler Inhale 1-2 puffs into the lungs every 6 (six) hours as needed for wheezing or shortness of breath. 01/23/22  Yes Triplett, Tammy, PA-C  brimonidine (ALPHAGAN) 0.2 % ophthalmic solution Place 1 drop into both eyes 2 (two) times daily. 01/09/22  Yes [provider]  furosemide (LASIX) 40 MG tablet Take 1 tablet (40 mg total) by mouth daily. 02/05/22 02/05/23 Yes Martin, Mary-Margaret, FNP  lactulose (CHRONULAC) 10 GM/15ML solution Take 15 mLs (10 g total) by mouth 2 (two) times daily. 02/09/22  Yes Hawks, Christy A, FNP  linagliptin (TRADJENTA) 5 MG TABS tablet Take 1 tablet (5 mg total) by mouth daily. 02/09/22  Yes Hawks, Christy A, FNP  midodrine (PROAMATINE) 10 MG tablet Take 2 tablets (20 mg total) by mouth 3 (three) times daily with meals. 01/12/22  Yes Hassell Done, Mary-Margaret, FNP  nitroGLYCERIN (NITROSTAT) 0.4 MG SL tablet Place 1 tablet (0.4 mg total) under the tongue every 5 (five) minutes x 3 doses as needed for chest pain (if no relief after 2nd dose, proceed to ED or call 911). 02/19/22  Yes Satira Sark, MD  pantoprazole (PROTONIX) 40 MG tablet Take 1 tablet (40 mg total) by mouth 2 (two) times daily. 02/01/22 02/01/23 Yes Carver, Elon Alas, DO  potassium chloride (KLOR-CON) 10 MEQ tablet Take 10 mEq by mouth daily. 02/05/22  Yes [provider]  sodium bicarbonate 650 MG tablet Take 1 tablet (650 mg total) by mouth 3 (three) times daily. 11/22/21  Yes Barton Dubois, MD  spironolactone (ALDACTONE) 25 MG tablet Take 1 tablet (25 mg total) by mouth daily. 02/08/22  Yes Domenic Polite, MD  tamsulosin (FLOMAX) 0.4 MG CAPS capsule Take 1 capsule (0.4  mg total) by mouth daily after supper. 02/05/22  Yes Martin, Mary-Margaret, FNP  traZODone (DESYREL) 50 MG tablet Take 1 tablet (50 mg total) by mouth at bedtime. 02/05/22  Yes Hassell Done Mary-Margaret, FNP     Critical care time: 45 min.   Montey Hora, [facility] For pager details, please see AMION or use Epic chat  After 1900, please call [facility] for cross coverage needs 02/20/2022, 7:50 AM

## 2022-02-20 NOTE — ED Notes (Signed)
ED Provider at bedside. 

## 2022-02-20 NOTE — ED Provider Notes (Signed)
5:54 AM Care of the patient assumed on transfer from [place]. He was initially there for hand/leg cramping, has history of cirrhosis. Began vomiting blood, presumably variceal, required multiple PRBC transfusions, as well as PLT, and FFP. He was intubated for airway protection. Now also on levophed drip for persistent hypotension. Was sent ED-to-ED to see GI for emergent scope.   5:55 AM Dr. Duwayne Heck, ICU is aware patient is here. Awaiting call back from Dr. Lorenso Courier, GI.   6:15 AM Dr. Lorenso Courier, GI is aware of the patient and will call back with a plan after checking with the endo lab team.     Truddie Hidden, MD 02/20/22 [phone]

## 2022-02-20 NOTE — Consult Note (Addendum)
[facility] Consult: 8:13 AM 02/20/2022  LOS: 0 days    Referring Provider: Dr Tacy Learn  Primary Care Physician:  [name], [place] Primary Gastroenterologist:  Dr. Abbey Chatters    Reason for Consultation:  UGIB   HPI: Edward [name] is a 71 y.o. male.  PMH cirrhosis.  CKD.  DM 2.  Thrombocytopenia.  Anemia.  CAD.  BMS placed to LAD in 2006.  Copper deficiency. Had colonoscopy around 2013 and at GI patient reports 2 polyps removed.  Diagnosed with cirrhosis in July 2020 based on weakness, swelling CT findings cirrhosis, small ascites, splenomegaly, thickened esophagus, uncomplicated cholelithiasis.  Ultrasound showed HIDA scan showed mild biliary dyskinesia but no acute cholecystitis. Interestingly an ultrasound a year prior in 12/2020 showed no liver abnormality and normal PV Dopplers.  Spleen was mildly enlarged. 02/01/22 EGD: This was investigational study, the patient had not had GI bleeding.  Large, greater than 5 esophageal varices with no stigmata were eradicated with banding varices also nonbleeding.  Non-bleeding GOV  1 gastric Varices.  Examined duodenum unremarkable.  Lab testing related to liver disease as follows.   IgG normal, IgA elevated 507, IgM normal. Smooth muscle antibody normal.  Mitochondrial antibodies normal.  ANA negative.  Hepatitis A total antibody reactive.  Hepatitis A IgM negative HCV nonreactive hep B surface antibody less than 3.1.  Hepatitis B surface antigen nonreactive.  Hepatitis B core total antibody nonreactive.  Ceruloplasmin low 12.2.  Copper level low at 57.   Do not find AFP levels.  Has undergone several paracentesis.  Latest was 4 L tap yesterday.  Fluid analysis negative for SBP.  Diuretics include Aldactone 25 mg, furosemide 40 mg daily.  Potassium.  On lactulose and  twice daily Protonix. Patient also had follow-up with Dr. Domenic Polite, cardiologist yesterday.  He mentions some moderate residual RCA disease which was managed medically.  Patient reported no angina.  Just general fatigue.  Also mentioned orthostatic hypotension for which patient was on midodrine and off all hypertensives.  02/04/2022 CTAP without contrast showed trace bilateral pleural effusions.  Slight increase in moderate volume ascites.  Cirrhosis with changes of portal hypertension.  No focal liver lesions.  Thickening of distal esophagus, likely due to esophageal and paraesophageal varices but without contrast the study is limited.  Portal colopathy in the a sending colon.  Uncomplicated cholelithiasis.  Colonic diverticulosis but no diverticulitis.  4 vessel coronary calcification and aortic atherosclerosis.  For about a week he has been suffering from cramps in the legs, feet and hands.  This despite taking potassium supplement.  Mentally he has been okay.  Appetite is variable but no nausea or vomiting prior to early this morning. Presented to [facility] ED 9 PM yesterday for evaluation.  Got into her room around 1:30 in the morning.  At 3 AM he had onset hematemesis. Started a transfusion protocol and thus far received 8 PRBCs, 2 FFP, 1 platelets. Transferred to [facility] and now headed to the medical ICU.  Hgb past 2 weeks his range from 9.6-11.6.  Arrival it  was 11, repeat 13.8. Platelets 68.. 52.  INR 1.6. Na  133.  BUN/Creat 26/2.8.  GFR 30.    Patient is now intubated for airway protection.  7, octreotide, Protonix initiated.  No history of alcohol use.  Remotely was a moderate drinker but nothing for many years.  Lives with his wife. No family history of liver disease or autoimmune disease.     Past Medical History:  Diagnosis Date   Cataract    CKD (chronic kidney disease) stage 3, GFR 30-59 ml/min (HCC)    Coronary atherosclerosis of native coronary artery     BMS LAD 2006; residual 75% distal RCA; EF 50%   Essential hypertension    Glaucoma    Low back pain    MI (myocardial infarction) ([place])    Anterior 2006   Mixed hyperlipidemia    Renal insufficiency    Type 2 diabetes mellitus ([place])     Past Surgical History:  Procedure Laterality Date   CATARACT EXTRACTION, BILATERAL     CORONARY ANGIOPLASTY WITH STENT PLACEMENT  2006   ESOPHAGEAL BANDING  02/01/2022   Procedure: ESOPHAGEAL BANDING;  Surgeon: Eloise Harman, DO;  Location: [facility];  Service: Endoscopy;;   ESOPHAGOGASTRODUODENOSCOPY (EGD) WITH PROPOFOL N/A 02/01/2022   Procedure: ESOPHAGOGASTRODUODENOSCOPY (EGD) WITH PROPOFOL;  Surgeon: Eloise Harman, DO;  Location: [facility]ITE;  Service: Endoscopy;  Laterality: N/A;  12:30 am    Prior to Admission medications   Medication Sig Start Date End Date Taking? Authorizing Provider  albuterol (VENTOLIN HFA) 108 (90 Base) MCG/ACT inhaler Inhale 1-2 puffs into the lungs every 6 (six) hours as needed for wheezing or shortness of breath. 01/23/22  Yes Triplett, Tammy, PA-C  brimonidine (ALPHAGAN) 0.2 % ophthalmic solution Place 1 drop into both eyes 2 (two) times daily. 01/09/22  Yes [provider]  furosemide (LASIX) 40 MG tablet Take 1 tablet (40 mg total) by mouth daily. 02/05/22 02/05/23 Yes Martin, Mary-Margaret, FNP  lactulose (CHRONULAC) 10 GM/15ML solution Take 15 mLs (10 g total) by mouth 2 (two) times daily. 02/09/22  Yes Hawks, Christy A, FNP  linagliptin (TRADJENTA) 5 MG TABS tablet Take 1 tablet (5 mg total) by mouth daily. 02/09/22  Yes Hawks, Christy A, FNP  midodrine (PROAMATINE) 10 MG tablet Take 2 tablets (20 mg total) by mouth 3 (three) times daily with meals. 01/12/22  Yes Hassell Done, Mary-Margaret, FNP  nitroGLYCERIN (NITROSTAT) 0.4 MG SL tablet Place 1 tablet (0.4 mg total) under the tongue every 5 (five) minutes x 3 doses as needed for chest pain (if no relief after 2nd dose, proceed to ED or call 911). 02/19/22   Yes Satira Sark, MD  pantoprazole (PROTONIX) 40 MG tablet Take 1 tablet (40 mg total) by mouth 2 (two) times daily. 02/01/22 02/01/23 Yes Carver, Elon Alas, DO  potassium chloride (KLOR-CON) 10 MEQ tablet Take 10 mEq by mouth daily. 02/05/22  Yes [provider]  sodium bicarbonate 650 MG tablet Take 1 tablet (650 mg total) by mouth 3 (three) times daily. 11/22/21  Yes Barton Dubois, MD  spironolactone (ALDACTONE) 25 MG tablet Take 1 tablet (25 mg total) by mouth daily. 02/08/22  Yes Domenic Polite, MD  tamsulosin (FLOMAX) 0.4 MG CAPS capsule Take 1 capsule (0.4 mg total) by mouth daily after supper. 02/05/22  Yes Martin, Mary-Margaret, FNP  traZODone (DESYREL) 50 MG tablet Take 1 tablet (50 mg total) by mouth at bedtime. 02/05/22  Yes Hassell Done Mary-Margaret, FNP    Scheduled Meds:  docusate  100 mg Per Tube BID   [START ON 02/23/2022] pantoprazole  40 mg Intravenous Q12H   polyethylene glycol  17 g Per Tube Daily   Infusions:  calcium gluconate     lactated ringers 100 mL/hr at 02/20/22 0805   norepinephrine (LEVOPHED) Adult infusion 2 mcg/min (02/20/22 0807)   octreotide (SANDOSTATIN) 500 mcg in sodium chloride 0.9 % 250 mL (2 mcg/mL) infusion 50 mcg/hr (02/20/22 3710)   pantoprazole Stopped (02/20/22 0414)   PRN Meds: docusate, fentaNYL (SUBLIMAZE) injection, fentaNYL (SUBLIMAZE) injection, midazolam, midazolam, polyethylene glycol   Allergies as of 02/19/2022   (No Known Allergies)    Family History  Problem Relation Age of Onset   Aneurysm Father        Brain aneurysm   Lung cancer Mother        Small cell carcinoma of lung,kidney and heart   Heart disease Mother    Cancer Brother        lungs   Diabetes Sister    Diabetes Sister    Diabetes Brother    Diabetes Brother     Social History   Socioeconomic History   Marital status: Legally Separated    Spouse name: [name]    Number of children: 2   Years of education: 7th   Highest education level: 7th  grade  Occupational History   Occupation: Retired  Tobacco Use   Smoking status: Former    Packs/day: 2.00    Years: 20.00    Total pack years: 40.00    Types: Cigarettes    Quit date: 05/08/1987    Years since quitting: 34.8    Passive exposure: Past   Smokeless tobacco: Never  Vaping Use   Vaping Use: Never used  Substance and Sexual Activity   Alcohol use: No    Alcohol/week: 0.0 standard drinks of alcohol   Drug use: No   Sexual activity: Not Currently  Other Topics Concern   Not on file  Social History Narrative   Lives alone    Social Determinants of Health   Financial Resource Strain: Low Risk  (07/11/2021)   Overall Financial Resource Strain (CARDIA)    Difficulty of Paying Living Expenses: Not very hard  Food Insecurity: Unknown (02/13/2022)   Hunger Vital Sign    Worried About Running Out of Food in the Last Year: Patient refused    [place] in the Last Year: Patient refused  Transportation Needs: Unknown (02/13/2022)   [place] - Transportation    Lack of Transportation (Medical): Patient refused    Lack of Transportation (Non-Medical): Patient refused  Physical Activity: Insufficiently Active (07/11/2021)   Exercise Vital Sign    Days of Exercise per Week: 7 days    Minutes of Exercise per Session: 20 min  Stress: No Stress Concern Present (07/11/2021)   [employer] [place]    Feeling of Stress : Not at all  Social Connections: Socially Isolated (07/11/2021)   Social Connection and Isolation Panel [NHANES]    Frequency of Communication with Friends and Family: More than three times a week    Frequency of Social Gatherings with Friends and Family: More than three times a week    Attends Religious Services: Never    Marine scientist or Organizations: No    Attends Archivist Meetings: Never    Marital Status: Separated  Intimate Partner Violence: Unknown (02/13/2022)   Humiliation,  Afraid, Rape, and Kick questionnaire  Fear of Current or Ex-Partner: Patient refused    Emotionally Abused: Patient refused    Physically Abused: Patient refused    Sexually Abused: Patient refused    REVIEW OF SYSTEMS: This information was obtained from his wife and sister-in-law as the patient is sedated/intubated Constitutional: Patient generally has weakness but this has not changed dramatically in recent days. ENT:  No nose bleeds Pulm: No cough.  No shortness of breath. CV:  No palpitations, no angina.  Overall lower extremity edema improved from a few months back. GU:  No hematuria, no frequency.  No oliguria. GI: See HPI. Heme: No unusual or excessive bleeding or bruising. Transfusions: As per HPI. Neuro: Peripheral cramping and some tingling in the limbs. Derm:  No itching, no rash or sores.  Endocrine:  No sweats or chills.  No polyuria or dysuria Immunization: Reviewed.  Do not see any records of immunizations. Travel: Not queried.   PHYSICAL EXAM: Vital signs in last 24 hours: Vitals:   02/20/22 0736 02/20/22 0745  BP: 109/74 104/76  Pulse: (!) 130 (!) 123  Resp: 16 17  Temp: (!) 94.9 F (34.9 C) (!) 95.1 F (35.1 C)  SpO2: 99% 99%   Wt Readings from Last 3 Encounters:  02/19/22 64.4 kg  02/19/22 64.6 kg  02/13/22 71.7 kg    General: Patient intubated, pale, looks ill and frail.  Unresponsive to exam. Head: No facial asymmetry or swelling.  Streaks of dried blood coming from the mouth.  Patient is intubated. Eyes: No scleral icterus.  No conjunctival pallor. Ears: Unable to assess Nose: No obvious discharge Mouth: ETT in place.  Dried blood coming from the corners of the mouth.  Did not examine oral cavity. Neck: No JVD Lungs: Quiet breathing on the vent. Heart: Giller rhythm.  Slight tachycardia. Abdomen: Soft with active bowel sounds.  No tenderness elicited on exam..   Rectal: Deferred Musc/Skeltl: No joint redness, swelling or gross  deformity Extremities: Slight pedal edema, barely pitting. Neurologic: Unresponsive. Skin: Pale.     Intake/Output from previous day: 10/16 0701 - 10/17 0700 In: 4258.8 [I.V.:43.8; Blood:3615; IV [id number]:782] Out: 1250 [Emesis/NG output:850] Intake/Output this shift: No intake/output data recorded.  LAB RESULTS: Recent Labs    02/20/22 0142 02/20/22 0600 02/20/22 0638  WBC 5.4  --   --   HGB 11.0* 13.8 12.9*  HCT 32.2* 39.9 38.0*  PLT 52*  --   --    BMET Lab Results  Component Value Date   NA 131 (L) 02/20/2022   NA 133 (L) 02/20/2022   NA 130 (L) 02/14/2022   K 3.8 02/20/2022   K 4.8 02/20/2022   K 3.5 02/14/2022   CL 93 (L) 02/20/2022   CL 95 (L) 02/14/2022   CL 91 (L) 02/13/2022   CO2 29 02/20/2022   CO2 26 02/14/2022   CO2 26 02/13/2022   GLUCOSE 198 (H) 02/20/2022   GLUCOSE 181 (H) 02/14/2022   GLUCOSE 333 (H) 02/13/2022   BUN 26 (H) 02/20/2022   BUN 30 (H) 02/14/2022   BUN 30 (H) 02/13/2022   CREATININE 2.30 (H) 02/20/2022   CREATININE 1.87 (H) 02/14/2022   CREATININE 2.10 (H) 02/13/2022   CALCIUM 9.1 02/20/2022   CALCIUM 8.3 (L) 02/14/2022   CALCIUM 8.6 (L) 02/13/2022   LFT Recent Labs    02/20/22 0142  PROT 6.7  ALBUMIN 3.5  AST 57*  ALT 49*  ALKPHOS 81  BILITOT 3.1*   PT/INR Lab Results  Component Value Date  INR 1.6 (H) 02/20/2022   INR 1.5 (H) 02/13/2022   INR 1.7 (H) 02/04/2022   Hepatitis Panel No results for input(s): "HEPBSAG", "HCVAB", "HEPAIGM", "HEPBIGM" in the last 72 hours. C-Diff No components found for: "CDIFF" Lipase     Component Value Date/Time   LIPASE 81 (H) 02/13/2022 1450    Drugs of Abuse  No results found for: "LABOPIA", "COCAINSCRNUR", "LABBENZ", "AMPHETMU", "THCU", "LABBARB"   RADIOLOGY STUDIES: DG Chest Port 1 View  Result Date: 02/20/2022 CLINICAL DATA:  Intubation EXAM: PORTABLE CHEST 1 VIEW COMPARISON:  02/13/2022 FINDINGS: Endotracheal tube with tip just below the clavicular heads. The  enteric tube at least reaches the stomach. Artifact from EKG leads. Low volume chest with streaky density at the bases suggesting atelectasis. No effusion or pneumothorax. Skin fold at the left apex. IMPRESSION: 1. Unremarkable hardware positioning. 2. Low volume chest with mild atelectasis. Electronically Signed   By: Jorje Guild M.D.   On: 02/20/2022 04:13   US Paracentesis  Result Date: 02/19/2022 INDICATION: Ascites secondary to cirrhosis EXAM: ULTRASOUND GUIDED  PARACENTESIS MEDICATIONS: None. COMPLICATIONS: None immediate. PROCEDURE: Informed written consent was obtained from the patient after a discussion of the risks, benefits and alternatives to treatment. A timeout was performed prior to the initiation of the procedure. Initial ultrasound scanning demonstrates a large amount of ascites within the right lower abdominal quadrant. The right lower abdomen was prepped and draped in the usual sterile fashion. 1% lidocaine was used for local anesthesia. Following this, a 19 gauge, 7-cm, Yueh catheter was introduced. An ultrasound image was saved for documentation purposes. The paracentesis was performed. The catheter was removed and a dressing was applied. The patient tolerated the procedure well without immediate post procedural complication. Patient received post-procedure intravenous albumin; see nursing notes for details. FINDINGS: A total of approximately 4.0 L of serous fluid was removed. Samples were sent to the laboratory as requested by the clinical team. IMPRESSION: Successful ultrasound-guided paracentesis yielding 4.0 L of peritoneal fluid. Electronically Signed   By: Abigail Miyamoto M.D.   On: 02/19/2022 12:13      IMPRESSION:   Acute hematemesis.  Patient underwent EGD with banding/eradication of large esophageal varices as well as nonbleeding gastric varices.  ?  Recurrent esophageal varices with bleeding versus bleeding from gastric varices.  Could also have bled from ulcer at site of  band placement and esophageal varices.  Thrombocytopenia.  Has received platelets.  Coagulopathy.  Received FFP.  Blood loss anemia.  Has received 8 PRBCs thus far.  Cirrhosis of the liver.  Etiology undetermined but is not due to metabolic, autoimmune or viral causes.    PLAN:       EGD at bedside.      Continue Octreotide gtt, Protonix gtt., Rocephin.   Azucena Freed  02/20/2022, 8:13 AM Phone [phone]   Attending Physician Note   I have taken a history, reviewed the chart and examined the patient. I performed a substantive portion of this encounter, including complete performance of at least one of the key components, in conjunction with the APP. I agree with the APP's note, impression and recommendations with my edits. My additional impressions and recommendations are as follows.   Acute major UGI bleed with known esophageal and gastric varices. EGD with esophageal variceal banding on 02/01/2022 by Dr. Abbey Chatters. Acute hematemesis. Intubated for airway protection. Suspected esophageal or gastric variceal bleed or bleed from banding related esophageal ulcers. Octreotide gtt, pantoprazole gtt, Rocephin. Urgent EGD at beside in  ICU. If bleeding gastric varices or refractory esophageal variceal bleeding consult IR for consideration of TIPS.   ABL anemia. Received 8 U PRBCs thus far and platelets. Trend CBC. Transfusions to maintain Hgb > 7.  Decompensated cirrhosis with ascites, esophageal varices. Etiology unclear.  Trend CMP, PT/INR.   Lucio Edward, MD [facility] See [facility], [facility], for our on call provider

## 2022-02-20 NOTE — Op Note (Signed)
[facility] Patient Name: Edward [name] Procedure Date : 02/20/2022 MRN: [id number] Attending MD: Ladene Artist , MD Date of Birth: [birth year] CSN: [id number] Age: 71 Admit Type: Inpatient Procedure:                Upper GI endoscopy Indications:              Hematemesis, Active gastrointestinal bleeding Providers:                Pricilla Riffle. Fuller Plan, MD, Jaci Carrel, RN, Brien Mates, RNFA, Luan Moore, Technician Referring MD:             CCM Medicines:                Propofol per ICU Complications:            No immediate complications. Estimated Blood Loss:     Estimated blood loss was minimal. Procedure:                Pre-Anesthesia Assessment:                           - Prior to the procedure, a History and Physical                            was performed, and patient medications and                            allergies were reviewed. The patient's tolerance of                            previous anesthesia was also reviewed. The risks                            and benefits of the procedure and the sedation                            options and risks were discussed with the patient.                            All questions were answered, and informed consent                            was obtained. Prior Anticoagulants: The patient has                            taken no previous anticoagulant or antiplatelet                            agents. ASA Grade Assessment: IV - A patient with                            severe systemic disease that is a constant threat  to life. After reviewing the risks and benefits,                            the patient was deemed in satisfactory condition to                            undergo the procedure.                           After obtaining informed consent, the endoscope was                            passed under direct vision. Throughout the                             procedure, the patient's blood pressure, pulse, and                            oxygen saturations were monitored continuously. The                            [id number]-[id number] ([id number]) Olympus endoscope was introduced                            through the mouth, and advanced to the second part                            of duodenum. Technical problems with execessive                            brightness on photos noted after the procedure was                            completed however endoscopic views during the                            procedure were normal. Scope In: Scope Out: Findings:      Grade III varices actively bleeding were found in the mid esophagus and       in the distal esophagus. They were 6 mm in largest diameter. Two fibrin       plugs on one distal esophageal varix were present. Six bands were       successfully placed with complete eradication of distal esophageal       varices and incomplete eradication of mid esophageal varices. Bleeding       had stopped at the end of the procedure.      The exam of the esophagus was otherwise normal.      Large volume of red blood and clots found in the cardia, in the gastric       fundus and in the gastric body. Unable able to clear the majority of the       blood. The cardia and fundus were not visualized.      The exam of the stomach was otherwise normal.      The duodenal bulb and second portion of the  duodenum were normal. Impression:               - Grade III esophageal varices actively bleeding.                            Incompletely eradicated. Banded.                           - Large volume of red blood and clots in the                            cardia, in the gastric fundus and in the gastric                            body. The cardia and fundus were not visualized.                           - Normal duodenal bulb and second portion of the                            duodenum.                           - No  specimens collected. Recommendation:           - Remain in ICU for ongoing care.                           - NPO today.                           - Continue present medications.                           - Observe for rebleeding and consult IR for                            consideration of TIPS if bleeding recurs.                           - Outpatient GI follow up with Dr. Abbey Chatters Procedure Code(s):        --- Professional ---                           [phone], Esophagogastroduodenoscopy, flexible,                            transoral; with band ligation of esophageal/gastric                            varices Diagnosis Code(s):        --- Professional ---                           I85.01, Esophageal varices with bleeding  K92.2, Gastrointestinal hemorrhage, unspecified                           K92.0, Hematemesis CPT copyright 2019 American Medical Association. All rights reserved. The codes documented in this report are preliminary and upon coder review may  be revised to meet current compliance requirements. Ladene Artist, MD 02/20/2022 10:24:21 AM This report has been signed electronically. Number of Addenda: 0

## 2022-02-20 NOTE — Procedures (Signed)
Central Venous Catheter Insertion Procedure Note  Edward [name] [facility]  [id number]  27-Feb-1951  Date:02/20/22  Time:10:22 PM   Provider Performing:Ricci Dirocco R Abbygayle Helfand   Procedure: Insertion of Non-tunneled Central Venous Catheter(36556) with US guidance ([id number])   Indication(s) Medication administration  Consent Unable to obtain consent due to emergent nature of procedure.  Anesthesia Topical only with 1% lidocaine   Timeout Verified patient identification, verified procedure, site/side was marked, verified correct patient position, special equipment/implants available, medications/allergies/relevant history reviewed, required imaging and test results available.  Sterile Technique Maximal sterile technique including full sterile barrier drape, hand hygiene, sterile gown, sterile gloves, mask, hair covering, sterile ultrasound probe cover (if used).  Procedure Description Area of catheter insertion was cleaned with chlorhexidine and draped in sterile fashion.  With real-time ultrasound guidance a central venous catheter was placed into the left internal jugular vein. Nonpulsatile blood flow and easy flushing noted in all ports.  The catheter was sutured in place and sterile dressing applied.  Complications/Tolerance None; patient tolerated the procedure well. Chest X-ray is ordered to verify placement for internal jugular or subclavian cannulation.   Chest x-ray is not ordered for femoral cannulation.  EBL Minimal  Specimen(s) None  [name] [name], PA-C

## 2022-02-20 NOTE — Progress Notes (Signed)
[facility] Progress Note Patient Name: SARON TWEED DOB: [birth year] MRN: [id number]   Date of Service  02/20/2022  HPI/Events of Note  Coagulopathy - INR > 10, PTT > 200 and Fibrinogen < 60.   eICU Interventions  Plan: Will transfuse 2 pools of Cryoprecipitate. Repeat PT/INR, PTT and Fibrinogen at 5 AM.      Intervention Category Major Interventions: Other:  Lysle Dingwall 02/20/2022, 11:56 PM

## 2022-02-20 NOTE — ED Notes (Addendum)
Pt to ED via carelink from [place]. Pt unresponsive, carelink reports that pt was not responsive through transport. Pt had 12m out via suction during transport.  Pt transferred to bed, vent and suction on arrival. Dr. SKarle Starchand RT at bedside. Pt received 8U RBCs, 2U FFPs, 1 platelet at [facility].  Pt had approximately 12025moutput per [facility].  Octreotide and norepi gtts infusing on arrival.

## 2022-02-20 NOTE — Consult Note (Signed)
[place] Nurse Consult Note: Patient receiving care in [facility] 3M5. Reason for Consult: circular rash bilateral interior calfs Wound type: unclear etiology.  Wife says he normally wears socks and tennis shoes, not high top boots. Pressure Injury POA: Yes/No/NA Measurement:na Wound bed: Left medial LE has an annular pink lesion; Right medial LE has a small annular pink lesion. There is a dry/crusty lesion on the dorsum of the left foot.  None of these areas have an open wound.  Perhaps areas are related to a condition such as erythema annulare centrifugum.  This really needs a diagnosis and treatment by an MD.  This exceeds the scope of practice of the [place] nurse. Drainage (amount, consistency, odor)  Periwound: Dressing procedure/placement/frequency: If a cover dressing is desired, please use a Telfa non-adherent pad rather than a foam dressing.   Thank you for the consult.  Discussed plan of care with the bedside nurse.  [place] nurse will not follow at this time.  Please re-consult the [place] team if needed.  Val Riles, RN, MSN, CWOCN, CNS-BC, pager [phone]

## 2022-02-20 NOTE — Anesthesia Preprocedure Evaluation (Deleted)
Anesthesia Evaluation  Patient identified by MRN, date of birth, ID band Patient unresponsive    Reviewed: Patient's Chart, lab work & pertinent test results, Unable to perform ROS - Chart review onlyPreop documentation limited or incomplete due to emergent nature of procedure.  Airway Mallampati: Intubated  TM Distance: >3 FB Neck ROM: Full    Dental no notable dental hx.    Pulmonary shortness of breath, former smoker,    Pulmonary exam normal breath sounds clear to auscultation       Cardiovascular hypertension, + CAD, + Past MI and + Cardiac Stents  Normal cardiovascular exam Rhythm:Regular Rate:Normal     Neuro/Psych negative neurological ROS  negative psych ROS   GI/Hepatic GERD  ,(+) Cirrhosis   Esophageal Varices    , Vomiting blood at [facility], intubated there , hx of cirrhosis with varices. previous banding   Endo/Other  diabetes  Renal/GU negative Renal ROS  negative genitourinary   Musculoskeletal negative musculoskeletal ROS (+)   Abdominal   Peds negative pediatric ROS (+)  Hematology  (+) Blood dyscrasia, anemia ,   Anesthesia Other Findings   Reproductive/Obstetrics negative OB ROS                             Anesthesia Physical Anesthesia Plan  ASA: 4 and emergent  Anesthesia Plan: General   Post-op Pain Management: Minimal or no pain anticipated   Induction: Intravenous  PONV Risk Score and Plan: 2 and Ondansetron and Treatment may vary due to age or medical condition  Airway Management Planned: Oral ETT  Additional Equipment:   Intra-op Plan:   Post-operative Plan: Post-operative intubation/ventilation  Informed Consent: I have reviewed the patients History and Physical, chart, labs and discussed the procedure including the risks, benefits and alternatives for the proposed anesthesia with the patient or authorized representative who has indicated his/her  understanding and acceptance.     Dental advisory given  Plan Discussed with: CRNA and Surgeon  Anesthesia Plan Comments:         Anesthesia Quick Evaluation

## 2022-02-20 NOTE — H&P (View-Only) (Signed)
[facility] Consult: 8:13 AM 02/20/2022  LOS: 0 days    Referring Provider: Dr Tacy Learn  Primary Care Physician:  [name], [place] Primary Gastroenterologist:  Dr. Abbey Chatters    Reason for Consultation:  UGIB   HPI: Edward [name] is a 71 y.o. male.  PMH cirrhosis.  CKD.  DM 2.  Thrombocytopenia.  Anemia.  CAD.  BMS placed to LAD in 2006.  Copper deficiency. Had colonoscopy around 2013 and at GI patient reports 2 polyps removed.  Diagnosed with cirrhosis in July 2020 based on weakness, swelling CT findings cirrhosis, small ascites, splenomegaly, thickened esophagus, uncomplicated cholelithiasis.  Ultrasound showed HIDA scan showed mild biliary dyskinesia but no acute cholecystitis. Interestingly an ultrasound a year prior in 12/2020 showed no liver abnormality and normal PV Dopplers.  Spleen was mildly enlarged. 02/01/22 EGD: This was investigational study, the patient had not had GI bleeding.  Large, greater than 5 esophageal varices with no stigmata were eradicated with banding varices also nonbleeding.  Non-bleeding GOV  1 gastric Varices.  Examined duodenum unremarkable.  Lab testing related to liver disease as follows.   IgG normal, IgA elevated 507, IgM normal. Smooth muscle antibody normal.  Mitochondrial antibodies normal.  ANA negative.  Hepatitis A total antibody reactive.  Hepatitis A IgM negative HCV nonreactive hep B surface antibody less than 3.1.  Hepatitis B surface antigen nonreactive.  Hepatitis B core total antibody nonreactive.  Ceruloplasmin low 12.2.  Copper level low at 57.   Do not find AFP levels.  Has undergone several paracentesis.  Latest was 4 L tap yesterday.  Fluid analysis negative for SBP.  Diuretics include Aldactone 25 mg, furosemide 40 mg daily.  Potassium.  On lactulose and  twice daily Protonix. Patient also had follow-up with Dr. Domenic Polite, cardiologist yesterday.  He mentions some moderate residual RCA disease which was managed medically.  Patient reported no angina.  Just general fatigue.  Also mentioned orthostatic hypotension for which patient was on midodrine and off all hypertensives.  02/04/2022 CTAP without contrast showed trace bilateral pleural effusions.  Slight increase in moderate volume ascites.  Cirrhosis with changes of portal hypertension.  No focal liver lesions.  Thickening of distal esophagus, likely due to esophageal and paraesophageal varices but without contrast the study is limited.  Portal colopathy in the a sending colon.  Uncomplicated cholelithiasis.  Colonic diverticulosis but no diverticulitis.  4 vessel coronary calcification and aortic atherosclerosis.  For about a week he has been suffering from cramps in the legs, feet and hands.  This despite taking potassium supplement.  Mentally he has been okay.  Appetite is variable but no nausea or vomiting prior to early this morning. Presented to [facility] ED 9 PM yesterday for evaluation.  Got into her room around 1:30 in the morning.  At 3 AM he had onset hematemesis. Started a transfusion protocol and thus far received 8 PRBCs, 2 FFP, 1 platelets. Transferred to [facility] and now headed to the medical ICU.  Hgb past 2 weeks his range from 9.6-11.6.  Arrival it  was 11, repeat 13.8. Platelets 68.. 52.  INR 1.6. Na  133.  BUN/Creat 26/2.8.  GFR 30.    Patient is now intubated for airway protection.  7, octreotide, Protonix initiated.  No history of alcohol use.  Remotely was a moderate drinker but nothing for many years.  Lives with his wife. No family history of liver disease or autoimmune disease.     Past Medical History:  Diagnosis Date   Cataract    CKD (chronic kidney disease) stage 3, GFR 30-59 ml/min (HCC)    Coronary atherosclerosis of native coronary artery     BMS LAD 2006; residual 75% distal RCA; EF 50%   Essential hypertension    Glaucoma    Low back pain    MI (myocardial infarction) ([place])    Anterior 2006   Mixed hyperlipidemia    Renal insufficiency    Type 2 diabetes mellitus ([place])     Past Surgical History:  Procedure Laterality Date   CATARACT EXTRACTION, BILATERAL     CORONARY ANGIOPLASTY WITH STENT PLACEMENT  2006   ESOPHAGEAL BANDING  02/01/2022   Procedure: ESOPHAGEAL BANDING;  Surgeon: Eloise Harman, DO;  Location: [facility] SUITE;  Service: Endoscopy;;   ESOPHAGOGASTRODUODENOSCOPY (EGD) WITH PROPOFOL N/A 02/01/2022   Procedure: ESOPHAGOGASTRODUODENOSCOPY (EGD) WITH PROPOFOL;  Surgeon: Eloise Harman, DO;  Location: [facility] ENDO SUITE;  Service: Endoscopy;  Laterality: N/A;  12:30 am    Prior to Admission medications   Medication Sig Start Date End Date Taking? Authorizing Provider  albuterol (VENTOLIN HFA) 108 (90 Base) MCG/ACT inhaler Inhale 1-2 puffs into the lungs every 6 (six) hours as needed for wheezing or shortness of breath. 01/23/22  Yes Triplett, Tammy, PA-C  brimonidine (ALPHAGAN) 0.2 % ophthalmic solution Place 1 drop into both eyes 2 (two) times daily. 01/09/22  Yes [provider]  furosemide (LASIX) 40 MG tablet Take 1 tablet (40 mg total) by mouth daily. 02/05/22 02/05/23 Yes Martin, Mary-Margaret, FNP  lactulose (CHRONULAC) 10 GM/15ML solution Take 15 mLs (10 g total) by mouth 2 (two) times daily. 02/09/22  Yes Hawks, Christy A, FNP  linagliptin (TRADJENTA) 5 MG TABS tablet Take 1 tablet (5 mg total) by mouth daily. 02/09/22  Yes Hawks, Christy A, FNP  midodrine (PROAMATINE) 10 MG tablet Take 2 tablets (20 mg total) by mouth 3 (three) times daily with meals. 01/12/22  Yes Hassell Done, Mary-Margaret, FNP  nitroGLYCERIN (NITROSTAT) 0.4 MG SL tablet Place 1 tablet (0.4 mg total) under the tongue every 5 (five) minutes x 3 doses as needed for chest pain (if no relief after 2nd dose, proceed to ED or call 911). 02/19/22   Yes Satira Sark, MD  pantoprazole (PROTONIX) 40 MG tablet Take 1 tablet (40 mg total) by mouth 2 (two) times daily. 02/01/22 02/01/23 Yes Carver, Elon Alas, DO  potassium chloride (KLOR-CON) 10 MEQ tablet Take 10 mEq by mouth daily. 02/05/22  Yes [provider]  sodium bicarbonate 650 MG tablet Take 1 tablet (650 mg total) by mouth 3 (three) times daily. 11/22/21  Yes Barton Dubois, MD  spironolactone (ALDACTONE) 25 MG tablet Take 1 tablet (25 mg total) by mouth daily. 02/08/22  Yes Domenic Polite, MD  tamsulosin (FLOMAX) 0.4 MG CAPS capsule Take 1 capsule (0.4 mg total) by mouth daily after supper. 02/05/22  Yes Martin, Mary-Margaret, FNP  traZODone (DESYREL) 50 MG tablet Take 1 tablet (50 mg total) by mouth at bedtime. 02/05/22  Yes Hassell Done Mary-Margaret, FNP    Scheduled Meds:  docusate  100 mg Per Tube BID   [START ON 02/23/2022] pantoprazole  40 mg Intravenous Q12H   polyethylene glycol  17 g Per Tube Daily   Infusions:  calcium gluconate     lactated ringers 100 mL/hr at 02/20/22 0805   norepinephrine (LEVOPHED) Adult infusion 2 mcg/min (02/20/22 0807)   octreotide (SANDOSTATIN) 500 mcg in sodium chloride 0.9 % 250 mL (2 mcg/mL) infusion 50 mcg/hr (02/20/22 5852)   pantoprazole Stopped (02/20/22 0414)   PRN Meds: docusate, fentaNYL (SUBLIMAZE) injection, fentaNYL (SUBLIMAZE) injection, midazolam, midazolam, polyethylene glycol   Allergies as of 02/19/2022   (No Known Allergies)    Family History  Problem Relation Age of Onset   Aneurysm Father        Brain aneurysm   Lung cancer Mother        Small cell carcinoma of lung,kidney and heart   Heart disease Mother    Cancer Brother        lungs   Diabetes Sister    Diabetes Sister    Diabetes Brother    Diabetes Brother     Social History   Socioeconomic History   Marital status: Legally Separated    Spouse name: [name]    Number of children: 2   Years of education: 7th   Highest education level: 7th  grade  Occupational History   Occupation: Retired  Tobacco Use   Smoking status: Former    Packs/day: 2.00    Years: 20.00    Total pack years: 40.00    Types: Cigarettes    Quit date: 05/08/1987    Years since quitting: 34.8    Passive exposure: Past   Smokeless tobacco: Never  Vaping Use   Vaping Use: Never used  Substance and Sexual Activity   Alcohol use: No    Alcohol/week: 0.0 standard drinks of alcohol   Drug use: No   Sexual activity: Not Currently  Other Topics Concern   Not on file  Social History Narrative   Lives alone    Social Determinants of Health   Financial Resource Strain: Low Risk  (07/11/2021)   Overall Financial Resource Strain (CARDIA)    Difficulty of Paying Living Expenses: Not very hard  Food Insecurity: Unknown (02/13/2022)   Hunger Vital Sign    Worried About Running Out of Food in the Last Year: Patient refused    [place] in the Last Year: Patient refused  Transportation Needs: Unknown (02/13/2022)   [place] - Transportation    Lack of Transportation (Medical): Patient refused    Lack of Transportation (Non-Medical): Patient refused  Physical Activity: Insufficiently Active (07/11/2021)   Exercise Vital Sign    Days of Exercise per Week: 7 days    Minutes of Exercise per Session: 20 min  Stress: No Stress Concern Present (07/11/2021)   [employer] [place]    Feeling of Stress : Not at all  Social Connections: Socially Isolated (07/11/2021)   Social Connection and Isolation Panel [NHANES]    Frequency of Communication with Friends and Family: More than three times a week    Frequency of Social Gatherings with Friends and Family: More than three times a week    Attends Religious Services: Never    Marine scientist or Organizations: No    Attends Archivist Meetings: Never    Marital Status: Separated  Intimate Partner Violence: Unknown (02/13/2022)   Humiliation,  Afraid, Rape, and Kick questionnaire  Fear of Current or Ex-Partner: Patient refused    Emotionally Abused: Patient refused    Physically Abused: Patient refused    Sexually Abused: Patient refused    REVIEW OF SYSTEMS: This information was obtained from his wife and sister-in-law as the patient is sedated/intubated Constitutional: Patient generally has weakness but this has not changed dramatically in recent days. ENT:  No nose bleeds Pulm: No cough.  No shortness of breath. CV:  No palpitations, no angina.  Overall lower extremity edema improved from a few months back. GU:  No hematuria, no frequency.  No oliguria. GI: See HPI. Heme: No unusual or excessive bleeding or bruising. Transfusions: As per HPI. Neuro: Peripheral cramping and some tingling in the limbs. Derm:  No itching, no rash or sores.  Endocrine:  No sweats or chills.  No polyuria or dysuria Immunization: Reviewed.  Do not see any records of immunizations. Travel: Not queried.   PHYSICAL EXAM: Vital signs in last 24 hours: Vitals:   02/20/22 0736 02/20/22 0745  BP: 109/74 104/76  Pulse: (!) 130 (!) 123  Resp: 16 17  Temp: (!) 94.9 F (34.9 C) (!) 95.1 F (35.1 C)  SpO2: 99% 99%   Wt Readings from Last 3 Encounters:  02/19/22 64.4 kg  02/19/22 64.6 kg  02/13/22 71.7 kg    General: Patient intubated, pale, looks ill and frail.  Unresponsive to exam. Head: No facial asymmetry or swelling.  Streaks of dried blood coming from the mouth.  Patient is intubated. Eyes: No scleral icterus.  No conjunctival pallor. Ears: Unable to assess Nose: No obvious discharge Mouth: ETT in place.  Dried blood coming from the corners of the mouth.  Did not examine oral cavity. Neck: No JVD Lungs: Quiet breathing on the vent. Heart: Giller rhythm.  Slight tachycardia. Abdomen: Soft with active bowel sounds.  No tenderness elicited on exam..   Rectal: Deferred Musc/Skeltl: No joint redness, swelling or gross  deformity Extremities: Slight pedal edema, barely pitting. Neurologic: Unresponsive. Skin: Pale.     Intake/Output from previous day: 10/16 0701 - 10/17 0700 In: 4258.8 [I.V.:43.8; Blood:3615; IV TFTDDUKGU:542] Out: 1250 [Emesis/NG output:850] Intake/Output this shift: No intake/output data recorded.  LAB RESULTS: Recent Labs    02/20/22 0142 02/20/22 0600 02/20/22 0638  WBC 5.4  --   --   HGB 11.0* 13.8 12.9*  HCT 32.2* 39.9 38.0*  PLT 52*  --   --    BMET Lab Results  Component Value Date   NA 131 (L) 02/20/2022   NA 133 (L) 02/20/2022   NA 130 (L) 02/14/2022   K 3.8 02/20/2022   K 4.8 02/20/2022   K 3.5 02/14/2022   CL 93 (L) 02/20/2022   CL 95 (L) 02/14/2022   CL 91 (L) 02/13/2022   CO2 29 02/20/2022   CO2 26 02/14/2022   CO2 26 02/13/2022   GLUCOSE 198 (H) 02/20/2022   GLUCOSE 181 (H) 02/14/2022   GLUCOSE 333 (H) 02/13/2022   BUN 26 (H) 02/20/2022   BUN 30 (H) 02/14/2022   BUN 30 (H) 02/13/2022   CREATININE 2.30 (H) 02/20/2022   CREATININE 1.87 (H) 02/14/2022   CREATININE 2.10 (H) 02/13/2022   CALCIUM 9.1 02/20/2022   CALCIUM 8.3 (L) 02/14/2022   CALCIUM 8.6 (L) 02/13/2022   LFT Recent Labs    02/20/22 0142  PROT 6.7  ALBUMIN 3.5  AST 57*  ALT 49*  ALKPHOS 81  BILITOT 3.1*   PT/INR Lab Results  Component Value Date  INR 1.6 (H) 02/20/2022   INR 1.5 (H) 02/13/2022   INR 1.7 (H) 02/04/2022   Hepatitis Panel No results for input(s): "HEPBSAG", "HCVAB", "HEPAIGM", "HEPBIGM" in the last 72 hours. C-Diff No components found for: "CDIFF" Lipase     Component Value Date/Time   LIPASE 81 (H) 02/13/2022 1450    Drugs of Abuse  No results found for: "LABOPIA", "COCAINSCRNUR", "LABBENZ", "AMPHETMU", "THCU", "LABBARB"   RADIOLOGY STUDIES: DG Chest Port 1 View  Result Date: 02/20/2022 CLINICAL DATA:  Intubation EXAM: PORTABLE CHEST 1 VIEW COMPARISON:  02/13/2022 FINDINGS: Endotracheal tube with tip just below the clavicular heads. The  enteric tube at least reaches the stomach. Artifact from EKG leads. Low volume chest with streaky density at the bases suggesting atelectasis. No effusion or pneumothorax. Skin fold at the left apex. IMPRESSION: 1. Unremarkable hardware positioning. 2. Low volume chest with mild atelectasis. Electronically Signed   By: Jorje Guild M.D.   On: 02/20/2022 04:13   US Paracentesis  Result Date: 02/19/2022 INDICATION: Ascites secondary to cirrhosis EXAM: ULTRASOUND GUIDED  PARACENTESIS MEDICATIONS: None. COMPLICATIONS: None immediate. PROCEDURE: Informed written consent was obtained from the patient after a discussion of the risks, benefits and alternatives to treatment. A timeout was performed prior to the initiation of the procedure. Initial ultrasound scanning demonstrates a large amount of ascites within the right lower abdominal quadrant. The right lower abdomen was prepped and draped in the usual sterile fashion. 1% lidocaine was used for local anesthesia. Following this, a 19 gauge, 7-cm, Yueh catheter was introduced. An ultrasound image was saved for documentation purposes. The paracentesis was performed. The catheter was removed and a dressing was applied. The patient tolerated the procedure well without immediate post procedural complication. Patient received post-procedure intravenous albumin; see nursing notes for details. FINDINGS: A total of approximately 4.0 L of serous fluid was removed. Samples were sent to the laboratory as requested by the clinical team. IMPRESSION: Successful ultrasound-guided paracentesis yielding 4.0 L of peritoneal fluid. Electronically Signed   By: Abigail Miyamoto M.D.   On: 02/19/2022 12:13      IMPRESSION:   Acute hematemesis.  Patient underwent EGD with banding/eradication of large esophageal varices as well as nonbleeding gastric varices.  ?  Recurrent esophageal varices with bleeding versus bleeding from gastric varices.  Could also have bled from ulcer at site of  band placement and esophageal varices.  Thrombocytopenia.  Has received platelets.  Coagulopathy.  Received FFP.  Blood loss anemia.  Has received 8 PRBCs thus far.  Cirrhosis of the liver.  Etiology undetermined but is not due to metabolic, autoimmune or viral causes.    PLAN:       EGD at bedside.      Continue Octreotide gtt, Protonix gtt., Rocephin.   Edward [name]  02/20/2022, 8:13 AM Phone [phone]   Attending Physician Note   I have taken a history, reviewed the chart and examined the patient. I performed a substantive portion of this encounter, including complete performance of at least one of the key components, in conjunction with the APP. I agree with the APP's note, impression and recommendations with my edits. My additional impressions and recommendations are as follows.   Acute major UGI bleed with known esophageal and gastric varices. EGD with esophageal variceal banding on 02/01/2022 by Dr. Abbey Chatters. Acute hematemesis. Intubated for airway protection. Suspected esophageal or gastric variceal bleed or bleed from banding related esophageal ulcers. Octreotide gtt, pantoprazole gtt, Rocephin. Urgent EGD at beside in  ICU. If bleeding gastric varices or refractory esophageal variceal bleeding consult IR for consideration of TIPS.   ABL anemia. Received 8 U PRBCs thus far and platelets. Trend CBC. Transfusions to maintain Hgb > 7.  Decompensated cirrhosis with ascites, esophageal varices. Etiology unclear.  Trend CMP, PT/INR.   Lucio Edward, MD [facility] See [facility], [facility], for our on call provider

## 2022-02-20 NOTE — ED Notes (Signed)
10 etomidate given 100 roccuronium given

## 2022-02-20 NOTE — ED Notes (Signed)
22m of epi given

## 2022-02-20 NOTE — Progress Notes (Addendum)
eLink Physician-Brief Progress Note Patient Name: NATIVIDAD HALLS DOB: 1950-12-09 MRN: [id number]   Date of Service  02/20/2022  HPI/Events of Note  Hypotension - SBP in 40's. Nursing reports that the patient has had a huge bloody BM and is now bleeding from his mouth and nose. Now on Norepinephrine IV infusion via PIV at 15 mcg/min. Patient is already on Octreotide and Protonix IV infusion.  Patient had EGD today with six bands placed to attempt to eradicate mid and distal esophageal varices.  eICU Interventions  Plan: Bolus with 0.9 NaCl 1 liter IV over 1 hour now. H/H, PT/INR, PTT, Fibrinogen and ionized Ca++ STAT. Will request PCCM ground team evaluate the patient at bedside and place a central venous catheter.  Monitor CVP now and Q 4 hours post central venous catheter placement.  May need TIPS.     Intervention Category Major Interventions: Hypotension - evaluation and management  Jiraiya Mcewan Cornelia Copa 02/20/2022, 9:19 PM

## 2022-02-20 NOTE — ED Notes (Signed)
GI at bedside

## 2022-02-20 NOTE — Progress Notes (Signed)
RN went into room at 2100 to assess patient due to sudden drop in blood pressure. This RN noted that the patient had a massive bloody BM followed by a small amount of bright red bleeding from the nose and mouth. Elink contacted, orders given, ground team to assess.

## 2022-02-20 NOTE — Progress Notes (Signed)
Pt transport from Trauma C to 3M05 on the vent without any complications.

## 2022-02-20 NOTE — Procedures (Signed)
Arterial Catheter Insertion Procedure Note  Edward [name] [facility]  [id number]  09-17-1950  Date:02/20/22  Time:10:45 PM    Provider Performing: Dimple Nanas    Procedure: Insertion of Arterial Line [phone]) with US guidance ([id number])   Indication(s) Blood pressure monitoring and/or need for frequent ABGs  Consent Unable to obtain consent due to emergent nature of procedure.  Anesthesia None   Time Out Verified patient identification, verified procedure, site/side was marked, verified correct patient position, special equipment/implants available, medications/allergies/relevant history reviewed, required imaging and test results available.   Sterile Technique Maximal sterile technique including full sterile barrier drape, hand hygiene, sterile gown, sterile gloves, mask, hair covering, sterile ultrasound probe cover (if used).   Procedure Description Area of catheter insertion was cleaned with chlorhexidine and draped in sterile fashion. With real-time ultrasound guidance an arterial catheter was placed into the left radial artery.  Appropriate arterial tracings confirmed on monitor.     Complications/Tolerance None; patient tolerated the procedure well.   EBL Minimal   Specimen(s) None

## 2022-02-20 NOTE — Progress Notes (Signed)
PCCM interval progress note:  Called to bedside as patient had a large, bright red bloody BM and bleeding from mouth and nares. He had previously been off pressors after transfusion, but back on Levophed 22mg.  CVC placed, given IVF bolus and Levophed down to 930m.  Per notes, if pt rebled then consult IR for possible TIPS.  Spoke with IR physician on call who reviewed case.  Pt is not an ideal candidate for TIPS as bili >3, should get CTA prior but has an AKI.  Recommended supportive care and IR will see in the AM.  Repeat CBC is pending, an additional 2 units PRBC's transfusing.    LaOtilio Carpenleason, PA-C

## 2022-02-20 NOTE — Telephone Encounter (Signed)
Mandy: he is currently inpatient in [place]. Thanks!

## 2022-02-20 NOTE — ED Notes (Signed)
7.5 ett  25 at the teeth Positive color change

## 2022-02-20 NOTE — Interval H&P Note (Signed)
History and Physical Interval Note:  02/20/2022 9:25 AM  Edward [name]  has presented today for surgery, with the diagnosis of Hematemesis, history of cirrhosis.  The various methods of treatment have been discussed with the patient and family. After consideration of risks, benefits and other options for treatment, the patient has consented to  Procedure(s): ESOPHAGOGASTRODUODENOSCOPY (EGD) WITH PROPOFOL (N/A) as a surgical intervention.  The patient's history has been reviewed, patient examined, no change in status, stable for surgery.  I have reviewed the patient's chart and labs.  Questions were answered to the patient's satisfaction.     Pricilla Riffle. Fuller Plan

## 2022-02-21 ENCOUNTER — Inpatient Hospital Stay (HOSPITAL_COMMUNITY): Payer: 59

## 2022-02-21 DIAGNOSIS — D62 Acute posthemorrhagic anemia: Secondary | ICD-10-CM | POA: Diagnosis not present

## 2022-02-21 DIAGNOSIS — E43 Unspecified severe protein-calorie malnutrition: Secondary | ICD-10-CM | POA: Insufficient documentation

## 2022-02-21 DIAGNOSIS — I8501 Esophageal varices with bleeding: Secondary | ICD-10-CM | POA: Diagnosis not present

## 2022-02-21 DIAGNOSIS — Z7189 Other specified counseling: Secondary | ICD-10-CM | POA: Diagnosis not present

## 2022-02-21 DIAGNOSIS — D689 Coagulation defect, unspecified: Secondary | ICD-10-CM

## 2022-02-21 DIAGNOSIS — Z515 Encounter for palliative care: Secondary | ICD-10-CM | POA: Diagnosis not present

## 2022-02-21 DIAGNOSIS — N183 Chronic kidney disease, stage 3 unspecified: Secondary | ICD-10-CM

## 2022-02-21 DIAGNOSIS — K7460 Unspecified cirrhosis of liver: Principal | ICD-10-CM

## 2022-02-21 DIAGNOSIS — N179 Acute kidney failure, unspecified: Secondary | ICD-10-CM

## 2022-02-21 DIAGNOSIS — R188 Other ascites: Secondary | ICD-10-CM

## 2022-02-21 DIAGNOSIS — K920 Hematemesis: Secondary | ICD-10-CM | POA: Diagnosis not present

## 2022-02-21 LAB — TYPE AND SCREEN
ABO/RH(D): AB POS
Antibody Screen: NEGATIVE
Unit division: 0
Unit division: 0
Unit division: 0
Unit division: 0
Unit division: 0
Unit division: 0
Unit division: 0
Unit division: 0
Unit division: 0
Unit division: 0

## 2022-02-21 LAB — CBC
HCT: 24.8 % — ABNORMAL LOW (ref 39.0–52.0)
HCT: 27.3 % — ABNORMAL LOW (ref 39.0–52.0)
HCT: 28 % — ABNORMAL LOW (ref 39.0–52.0)
Hemoglobin: 8.7 g/dL — ABNORMAL LOW (ref 13.0–17.0)
Hemoglobin: 9.9 g/dL — ABNORMAL LOW (ref 13.0–17.0)
Hemoglobin: 10 g/dL — ABNORMAL LOW (ref 13.0–17.0)
MCH: 32.1 pg (ref 26.0–34.0)
MCH: 31 pg (ref 26.0–34.0)
MCH: 31.1 pg (ref 26.0–34.0)
MCHC: 35.1 g/dL (ref 30.0–36.0)
MCHC: 36.3 g/dL — ABNORMAL HIGH (ref 30.0–36.0)
MCHC: 35.7 g/dL (ref 30.0–36.0)
MCV: 91.5 fL (ref 80.0–100.0)
MCV: 85.6 fL (ref 80.0–100.0)
MCV: 87 fL (ref 80.0–100.0)
Platelets: 75 10*3/uL — ABNORMAL LOW (ref 150–400)
Platelets: 72 10*3/uL — ABNORMAL LOW (ref 150–400)
Platelets: 66 10*3/uL — ABNORMAL LOW (ref 150–400)
RBC: 2.71 MIL/uL — ABNORMAL LOW (ref 4.22–5.81)
RBC: 3.19 MIL/uL — ABNORMAL LOW (ref 4.22–5.81)
RBC: 3.22 MIL/uL — ABNORMAL LOW (ref 4.22–5.81)
RDW: 16.9 % — ABNORMAL HIGH (ref 11.5–15.5)
RDW: 15.9 % — ABNORMAL HIGH (ref 11.5–15.5)
RDW: 16.4 % — ABNORMAL HIGH (ref 11.5–15.5)
WBC: 14.3 10*3/uL — ABNORMAL HIGH (ref 4.0–10.5)
WBC: 10.8 10*3/uL — ABNORMAL HIGH (ref 4.0–10.5)
WBC: 10 10*3/uL (ref 4.0–10.5)
nRBC: 0 % (ref 0.0–0.2)
nRBC: 0 % (ref 0.0–0.2)
nRBC: 0 % (ref 0.0–0.2)

## 2022-02-21 LAB — GLUCOSE, CAPILLARY
Glucose-Capillary: 240 mg/dL — ABNORMAL HIGH (ref 70–99)
Glucose-Capillary: 240 mg/dL — ABNORMAL HIGH (ref 70–99)
Glucose-Capillary: 201 mg/dL — ABNORMAL HIGH (ref 70–99)
Glucose-Capillary: 211 mg/dL — ABNORMAL HIGH (ref 70–99)
Glucose-Capillary: 180 mg/dL — ABNORMAL HIGH (ref 70–99)
Glucose-Capillary: 160 mg/dL — ABNORMAL HIGH (ref 70–99)

## 2022-02-21 LAB — CBC WITH DIFFERENTIAL/PLATELET
Abs Immature Granulocytes: 0.04 10*3/uL (ref 0.00–0.07)
Basophils Absolute: 0 10*3/uL (ref 0.0–0.1)
Basophils Relative: 0 %
Eosinophils Absolute: 0 10*3/uL (ref 0.0–0.5)
Eosinophils Relative: 0 %
HCT: 29.4 % — ABNORMAL LOW (ref 39.0–52.0)
Hemoglobin: 10.9 g/dL — ABNORMAL LOW (ref 13.0–17.0)
Immature Granulocytes: 0 %
Lymphocytes Relative: 11 %
Lymphs Abs: 1.1 10*3/uL (ref 0.7–4.0)
MCH: 31.4 pg (ref 26.0–34.0)
MCHC: 37.1 g/dL — ABNORMAL HIGH (ref 30.0–36.0)
MCV: 84.7 fL (ref 80.0–100.0)
Monocytes Absolute: 0.8 10*3/uL (ref 0.1–1.0)
Monocytes Relative: 8 %
Neutro Abs: 8.3 10*3/uL — ABNORMAL HIGH (ref 1.7–7.7)
Neutrophils Relative %: 81 %
Platelets: 68 10*3/uL — ABNORMAL LOW (ref 150–400)
RBC: 3.47 MIL/uL — ABNORMAL LOW (ref 4.22–5.81)
RDW: 15.9 % — ABNORMAL HIGH (ref 11.5–15.5)
WBC: 10.3 10*3/uL (ref 4.0–10.5)
nRBC: 0 % (ref 0.0–0.2)

## 2022-02-21 LAB — BPAM RBC
Blood Product Expiration Date: 202310282359
Blood Product Expiration Date: 202310282359
Blood Product Expiration Date: 202311082359
Blood Product Expiration Date: 202311082359
Blood Product Expiration Date: 202311082359
Blood Product Expiration Date: 202311082359
Blood Product Expiration Date: 202311152359
Blood Product Expiration Date: 202311162359
Blood Product Expiration Date: 202311162359
Blood Product Expiration Date: 202311162359
ISSUE DATE / TIME: 202310170322
ISSUE DATE / TIME: 202310170322
ISSUE DATE / TIME: 202310170337
ISSUE DATE / TIME: 202310170350
ISSUE DATE / TIME: 202310170358
ISSUE DATE / TIME: 202310170358
ISSUE DATE / TIME: 202310170431
ISSUE DATE / TIME: 202310170431
Unit Type and Rh: 1700
Unit Type and Rh: 6200
Unit Type and Rh: 6200
Unit Type and Rh: 6200
Unit Type and Rh: 6200
Unit Type and Rh: 6200
Unit Type and Rh: 6200
Unit Type and Rh: 9500
Unit Type and Rh: 9500
Unit Type and Rh: 9500

## 2022-02-21 LAB — APTT: aPTT: 35 seconds (ref 24–36)

## 2022-02-21 LAB — COMPREHENSIVE METABOLIC PANEL
ALT: 24 U/L (ref 0–44)
AST: 31 U/L (ref 15–41)
Albumin: 2.5 g/dL — ABNORMAL LOW (ref 3.5–5.0)
Alkaline Phosphatase: 38 U/L (ref 38–126)
Anion gap: 14 (ref 5–15)
BUN: 35 mg/dL — ABNORMAL HIGH (ref 8–23)
CO2: 22 mmol/L (ref 22–32)
Calcium: 8.4 mg/dL — ABNORMAL LOW (ref 8.9–10.3)
Chloride: 99 mmol/L (ref 98–111)
Creatinine, Ser: 2.78 mg/dL — ABNORMAL HIGH (ref 0.61–1.24)
GFR, Estimated: 24 mL/min — ABNORMAL LOW (ref >60)
Glucose, Bld: 243 mg/dL — ABNORMAL HIGH (ref 70–99)
Potassium: 4.9 mmol/L (ref 3.5–5.1)
Sodium: 135 mmol/L (ref 135–145)
Total Bilirubin: 4.9 mg/dL — ABNORMAL HIGH (ref 0.3–1.2)
Total Protein: 4.4 g/dL — ABNORMAL LOW (ref 6.5–8.1)

## 2022-02-21 LAB — TRIGLYCERIDES: Triglycerides: 68 mg/dL (ref <150)

## 2022-02-21 LAB — PREPARE PLATELET PHERESIS: Unit division: 0

## 2022-02-21 LAB — BPAM PLATELET PHERESIS
Blood Product Expiration Date: 202310192359
ISSUE DATE / TIME: 202310170344
Unit Type and Rh: 5100

## 2022-02-21 LAB — PROTIME-INR
INR: 1.7 — ABNORMAL HIGH (ref 0.8–1.2)
INR: 1.7 — ABNORMAL HIGH (ref 0.8–1.2)
Prothrombin Time: 19.7 seconds — ABNORMAL HIGH (ref 11.4–15.2)
Prothrombin Time: 19.6 seconds — ABNORMAL HIGH (ref 11.4–15.2)

## 2022-02-21 LAB — FIBRINOGEN: Fibrinogen: 258 mg/dL (ref 210–475)

## 2022-02-21 LAB — PHOSPHORUS: Phosphorus: 5.8 mg/dL — ABNORMAL HIGH (ref 2.5–4.6)

## 2022-02-21 LAB — CYTOLOGY - NON PAP

## 2022-02-21 LAB — MAGNESIUM: Magnesium: 1.7 mg/dL (ref 1.7–2.4)

## 2022-02-21 MED ORDER — INSULIN GLARGINE-YFGN 100 UNIT/ML ~~LOC~~ SOLN
10.0000 [IU] | Freq: Every day | SUBCUTANEOUS | Status: DC
  Administered 2022-02-21 – 2022-03-01 (×9): 10 [IU] via SUBCUTANEOUS
  Filled 2022-02-21 (×9): qty 0.1

## 2022-02-21 MED ORDER — SODIUM CHLORIDE 0.9 % IV SOLN
1.0000 g | INTRAVENOUS | Status: AC
  Administered 2022-02-21 – 2022-02-24 (×4): 1 g via INTRAVENOUS
  Filled 2022-02-21 (×5): qty 10

## 2022-02-21 MED ORDER — MAGNESIUM SULFATE 2 GM/50ML IV SOLN
2.0000 g | Freq: Once | INTRAVENOUS | Status: AC
  Administered 2022-02-21: 2 g via INTRAVENOUS
  Filled 2022-02-21: qty 50

## 2022-02-21 MED ORDER — CALCIUM GLUCONATE-NACL 1-0.675 GM/50ML-% IV SOLN
1.0000 g | Freq: Once | INTRAVENOUS | Status: AC
  Administered 2022-02-21: 1000 mg via INTRAVENOUS
  Filled 2022-02-21: qty 50

## 2022-02-21 MED ORDER — SODIUM CHLORIDE 0.9% IV SOLUTION: Freq: Once | INTRAVENOUS | Status: AC

## 2022-02-21 MED ORDER — VITAMIN K1 10 MG/ML IJ SOLN
5.0000 mg | Freq: Every day | INTRAVENOUS | Status: AC
  Administered 2022-02-21 – 2022-02-23 (×3): 5 mg via INTRAVENOUS
  Filled 2022-02-21 (×3): qty 0.5

## 2022-02-21 MED ORDER — SODIUM CHLORIDE 0.9% IV SOLUTION: Freq: Once | INTRAVENOUS | Status: DC

## 2022-02-21 NOTE — Consult Note (Addendum)
Consultation Note Date: 02/21/2022   Patient Name: Edward [name] [facility]  DOB: 1951-04-07  MRN: [id number]  Age / Sex: 71 y.o., male  PCP: [name], FNP Referring Physician: Julian Hy, DO  Reason for Consultation: Establishing goals of care  HPI/Patient Profile: 71 y.o. male  with past medical history of cirrhosis with ascites undergoing serial paracenteses, portal hypertension with paraesophageal and perigastric varices (s/p esophageal banding 02/01/22 after surveillance EGD), concern for Wilson's disease, CAD, DM, HTN, and GERD admitted on 02/20/2022 with cramping in hands and feet.  While in the ED he developed frank hematemesis.  Patient became hypotensive and unresponsive and ultimately required intubation.  GI bleed due to esophageal varices -he had 6 bands during EGD 10/18.  Patient also with AKI, likely hepatorenal syndrome.  Patient remains on ventilator and low-dose vasopressors.  Patient with frequent hospital admissions recently.  PMT consulted to discuss goals of care with family.  Clinical Assessment and Goals of Care: I have reviewed medical records including EPIC notes, labs and imaging, received report from RN, assessed the patient and then met with patient's family to include wife, brother, and sister-in-law to discuss diagnosis prognosis, GOC, EOL wishes, disposition and options.  I introduced Palliative Medicine as specialized medical care for people living with serious illness. It focuses on providing relief from the symptoms and stress of a serious illness. The goal is to improve quality of life for both the patient and the family.  We discussed a brief life review of the patient.  Family tells me he worked at a [employer].  Patient lives with his family in [place].  As far as functional and nutritional status family tells me he was doing well prior to admission - able to care for himself. They tell me of some  fluctuation in appetite. No confusion at home.   We discussed patient's current illness and what it means in the larger context of patient's on-going co-morbidities.  Natural disease trajectory and expectations at EOL were discussed. We discussed his liver disease and that [facility] had previously told him he was not a candidate for transplant. We discussed chronic, progressive nature of his disease with no good option to reverse it. We discussed his rapidly worsening status over the past several months with repeated hospitalizations.   I attempted to elicit values and goals of care important to the patient.    The difference between aggressive medical intervention and comfort care was considered in light of the patient's goals of care.   At this point family is hopeful for recovery to baseline status and return home. They are open to further discussion of medical interventions that may prolong his life including TIPS procedure if warranted.  We discussed code status and they were unsure. Encouraged family to consider DNR/DNI status understanding evidenced based poor outcomes in similar hospitalized patients, as the cause of the arrest is likely associated with chronic/terminal disease rather than a reversible acute cardio-pulmonary event. Ultimately decided to remain full code as they continue discussions and discuss with him further.  Family does share that patient would never want long term vent/trach.  Discussed with family the importance of continued conversation with family and the medical providers regarding overall plan of care and treatment options, ensuring decisions are within the context of the patients values and GOCs.    Questions and concerns were addressed. The family was encouraged to call with questions or concerns.  Primary Decision Maker NEXT OF KIN    SUMMARY OF RECOMMENDATIONS  To remain full code at this time - family a little uncertain about this but not ready to change  code status and hopeful that he can wake up and better participate in goals of care conversations  Family remains interested in medical interventions offered to prolong life - would be open to further discussion about TIPS if warranted  Family expresses patient would not want long term life support/trach  PMT to follow  Code Status/Advance Care Planning: Full code  Additional Recommendations (Limitations, Scope, Preferences): No Tracheostomy     Primary Diagnoses: Present on Admission:  GI bleed   I have reviewed the medical record, interviewed the patient and family, and examined the patient. The following aspects are pertinent.  Past Medical History:  Diagnosis Date   Cataract    CKD (chronic kidney disease) stage 3, GFR 30-59 ml/min (HCC)    Coronary atherosclerosis of native coronary artery    BMS LAD 2006; residual 75% distal RCA; EF 50%   Essential hypertension    Glaucoma    Low back pain    MI (myocardial infarction) ([place])    Anterior 2006   Mixed hyperlipidemia    Renal insufficiency    Type 2 diabetes mellitus ([place])    Social History   Socioeconomic History   Marital status: Legally Separated    Spouse name: [name]    Number of children: 2   Years of education: 7th   Highest education level: 7th grade  Occupational History   Occupation: Retired  Tobacco Use   Smoking status: Former    Packs/day: 2.00    Years: 20.00    Total pack years: 40.00    Types: Cigarettes    Quit date: 05/08/1987    Years since quitting: 34.8    Passive exposure: Past   Smokeless tobacco: Never  Vaping Use   Vaping Use: Never used  Substance and Sexual Activity   Alcohol use: No    Alcohol/week: 0.0 standard drinks of alcohol   Drug use: No   Sexual activity: Not Currently  Other Topics Concern   Not on file  Social History Narrative   Lives alone    Social Determinants of Health   Financial Resource Strain: Low Risk  (07/11/2021)   Overall Financial Resource  Strain (CARDIA)    Difficulty of Paying Living Expenses: Not very hard  Food Insecurity: Unknown (02/13/2022)   Hunger Vital Sign    Worried About Running Out of Food in the Last Year: Patient refused    [place] in the Last Year: Patient refused  Transportation Needs: Unknown (02/13/2022)   [place] - Transportation    Lack of Transportation (Medical): Patient refused    Lack of Transportation (Non-Medical): Patient refused  Physical Activity: Insufficiently Active (07/11/2021)   Exercise Vital Sign    Days of Exercise per Week: 7 days    Minutes of Exercise per Session: 20 min  Stress: No Stress Concern Present (07/11/2021)   [employer] [place]    Feeling of Stress : Not at all  Social Connections: Socially Isolated (07/11/2021)   Social Connection and Isolation Panel [NHANES]    Frequency of Communication with Friends and Family: More than three times a week    Frequency of Social Gatherings with Friends and Family: More than three times a week    Attends Religious Services: Never    Marine scientist or Organizations: No    Attends Archivist Meetings:  Never    Marital Status: Separated   Family History  Problem Relation Age of Onset   Aneurysm Father        Brain aneurysm   Lung cancer Mother        Small cell carcinoma of lung,kidney and heart   Heart disease Mother    Cancer Brother        lungs   Diabetes Sister    Diabetes Sister    Diabetes Brother    Diabetes Brother    Scheduled Meds:  sodium chloride   Intravenous Once   sodium chloride   Intravenous Once   Chlorhexidine Gluconate Cloth  6 each Topical Q2000   insulin aspart  0-9 Units Subcutaneous Q4H   insulin glargine-yfgn  10 Units Subcutaneous Daily   [START ON 02/23/2022] pantoprazole  40 mg Intravenous Q12H   sodium chloride flush  10-40 mL Intracatheter Q12H   terbinafine   Topical BID   Continuous Infusions:  sodium  chloride 10 mL/hr at 02/21/22 1500   [START ON 02/22/2022] cefTRIAXone (ROCEPHIN)  IV     norepinephrine (LEVOPHED) Adult infusion 2 mcg/min (02/21/22 1500)   octreotide (SANDOSTATIN) 500 mcg in sodium chloride 0.9 % 250 mL (2 mcg/mL) infusion 50 mcg/hr (02/21/22 1500)   pantoprazole 8 mg/hr (02/21/22 1500)   phytonadione (VITAMIN K) 5 mg in dextrose 5 % 50 mL IVPB Stopped (02/21/22 1347)   propofol (DIPRIVAN) infusion 30 mcg/kg/min (02/21/22 1500)   PRN Meds:.sodium chloride, fentaNYL (SUBLIMAZE) injection, fentaNYL (SUBLIMAZE) injection, midazolam, midazolam, mouth rinse, sodium chloride flush No Known Allergies Review of Systems  Unable to perform ROS: Intubated    Physical Exam Constitutional:      General: He is not in acute distress.    Appearance: He is ill-appearing.     Comments: Remains on vent, sedated  Pulmonary:     Effort: Pulmonary effort is normal.  Skin:    General: Skin is warm and dry.     Vital Signs: BP (!) 110/53   Pulse 69   Temp (!) 97.2 F (36.2 C)   Resp 16   Ht 5' 10"  (1.778 m)   Wt 64.4 kg   SpO2 100%   BMI 20.37 kg/m  Pain Scale: CPOT   Pain Score: 10-Worst pain ever   SpO2: SpO2: 100 % O2 Device:SpO2: 100 % O2 Flow Rate: .O2 Flow Rate (L/min): 0 L/min  IO: Intake/output summary:  Intake/Output Summary (Last 24 hours) at 02/21/2022 1620 Last data filed at 02/21/2022 1500 Gross per 24 hour  Intake 6266.43 ml  Output 510 ml  Net 5756.43 ml    LBM: Last BM Date :  (PTA) Baseline Weight: Weight: 64.4 kg Most recent weight: Weight: 64.4 kg       *Please note that this is a verbal dictation therefore any spelling or grammatical errors are due to the "[employer]" system interpretation.   Juel Burrow, DNP, AGNP-C Palliative Medicine Team [phone] Pager: [phone]

## 2022-02-21 NOTE — Telephone Encounter (Signed)
FYI:  Pt went to ER yesterday.

## 2022-02-21 NOTE — Progress Notes (Addendum)
Daily Rounding Note  02/21/2022, 10:47 AM  LOS: 1 day   SUBJECTIVE:   Chief complaint: Variceal bleed.  Output to the Flexi-Seal continues to be bloody liquid.  Remains intubated on vent.  OBJECTIVE:         Vital signs in last 24 hours:    Temp:  [96.6 F (35.9 C)-99.9 F (37.7 C)] 98.1 F (36.7 C) (10/18 1015) Pulse Rate:  [67-119] 74 (10/18 1015) Resp:  [12-29] 16 (10/18 1015) BP: (60-140)/(47-75) 117/64 (10/18 0800) SpO2:  [97 %-100 %] 100 % (10/18 1015) Arterial Line BP: (91-144)/(44-58) 107/49 (10/18 1015) FiO2 (%):  [40 %] 40 % (10/18 0750) Last BM Date :  (PTA) Filed Weights   02/19/22 2158  Weight: 64.4 kg   General: Breathing quietly on vent.  No response to voice or exam. Heart: RRR. Chest: No labored breathing.  Clear in front. Abdomen: Soft, nontender.  Active bowel sounds.  Burgundy red blood in Flexi-Seal tubing and bag. Extremities: Slight, nonpitting lower extremity edema. Neuro/Psych: Unresponsive.  Intake/Output from previous day: 10/17 0701 - 10/18 0700 In: 6382.9 [I.V.:1942.1; Blood:1919.7; IV Piggyback:2521.1] Out: 465 [Urine:465]  Intake/Output this shift: Total I/O In: 218.3 [I.V.:180.4; IV Piggyback:37.9] Out: 225 [Urine:25; Stool:200]  Lab Results: Recent Labs    02/20/22 0142 02/20/22 0600 02/20/22 1848 02/20/22 2209 02/21/22 0440  WBC 5.4  --   --  14.3* 10.3  HGB 11.0*   < > 10.6* 8.7* 10.9*  HCT 32.2*   < > 29.9* 24.8* 29.4*  PLT 52*  --   --  75* 68*   < > = values in this interval not displayed.   BMET Recent Labs    02/20/22 0142 02/20/22 0638 02/21/22 0440  NA 133* 131* 135  K 4.8 3.8 4.9  CL 93*  --  99  CO2 29  --  22  GLUCOSE 198*  --  243*  BUN 26*  --  35*  CREATININE 2.30*  --  2.78*  CALCIUM 9.1  --  8.4*   LFT Recent Labs    02/20/22 0142 02/21/22 0440  PROT 6.7 4.4*  ALBUMIN 3.5 2.5*  AST 57* 31  ALT 49* 24  ALKPHOS 81 38  BILITOT  3.1* 4.9*   PT/INR Recent Labs    02/20/22 2136 02/21/22 0440  LABPROT >90.0* 19.7*  INR >10.0* 1.7*   Hepatitis Panel No results for input(s): "HEPBSAG", "HCVAB", "HEPAIGM", "HEPBIGM" in the last 72 hours.  Studies/Results: DG Chest Port 1 View  Result Date: 02/21/2022 CLINICAL DATA:  Ventilator dependent respiratory failure. EXAM: PORTABLE CHEST 1 VIEW COMPARISON:  Portable chest yesterday at 10:23 p.m. FINDINGS: 4:59 a.m. ETT tip is 3.1 cm from the carina, left IJ central line tip in the distal SVC. There are multiple overlying monitor wires. There are low lung volumes. Mild elevation right hemidiaphragm limiting visualization of the right base. There is perihilar and right basilar linear atelectasis without focal consolidation the visualized lungs. The cardiomediastinal silhouette and vasculature are normal. There is thoracic spondylosis. IMPRESSION: Limited exam with low lung volumes and elevated right hemidiaphragm. Assessment of the bases is limited. The visualized lungs without focal infiltrates. Support apparatus as above. Electronically Signed   By: Telford Nab M.D.   On: 02/21/2022 06:18   DG CHEST PORT 1 VIEW  Result Date: 02/20/2022 CLINICAL DATA:  Central line placement EXAM: PORTABLE CHEST 1 VIEW COMPARISON:  02/20/2022 FINDINGS: Left central line is been placed with  the tip in the SVC. No pneumothorax. Endotracheal tube is 5 cm above the carina. Heart normal size. No confluent opacities or effusions. IMPRESSION: Support devices in expected position as above. No acute cardiopulmonary disease. Electronically Signed   By: Rolm Baptise M.D.   On: 02/20/2022 22:30   DG Chest Port 1 View  Result Date: 02/20/2022 CLINICAL DATA:  Intubation EXAM: PORTABLE CHEST 1 VIEW COMPARISON:  02/13/2022 FINDINGS: Endotracheal tube with tip just below the clavicular heads. The enteric tube at least reaches the stomach. Artifact from EKG leads. Low volume chest with streaky density at the  bases suggesting atelectasis. No effusion or pneumothorax. Skin fold at the left apex. IMPRESSION: 1. Unremarkable hardware positioning. 2. Low volume chest with mild atelectasis. Electronically Signed   By: Jorje Guild M.D.   On: 02/20/2022 04:13   US Paracentesis  Result Date: 02/19/2022 INDICATION: Ascites secondary to cirrhosis EXAM: ULTRASOUND GUIDED  PARACENTESIS MEDICATIONS: None. COMPLICATIONS: None immediate. PROCEDURE: Informed written consent was obtained from the patient after a discussion of the risks, benefits and alternatives to treatment. A timeout was performed prior to the initiation of the procedure. Initial ultrasound scanning demonstrates a large amount of ascites within the right lower abdominal quadrant. The right lower abdomen was prepped and draped in the usual sterile fashion. 1% lidocaine was used for local anesthesia. Following this, a 19 gauge, 7-cm, Yueh catheter was introduced. An ultrasound image was saved for documentation purposes. The paracentesis was performed. The catheter was removed and a dressing was applied. The patient tolerated the procedure well without immediate post procedural complication. Patient received post-procedure intravenous albumin; see nursing notes for details. FINDINGS: A total of approximately 4.0 L of serous fluid was removed. Samples were sent to the laboratory as requested by the clinical team. IMPRESSION: Successful ultrasound-guided paracentesis yielding 4.0 L of peritoneal fluid. Electronically Signed   By: Abigail Miyamoto M.D.   On: 02/19/2022 12:13    ASSESMENT:   Upper GI bleed with hematemesis. 02/21/2022: Dr. Fuller Plan placed multiple bands to actively bleeding grade 3 esophageal varices.  Noted large volume of blood and clots in the gastric cardia, fundus, body which obscured visualization of the cardia and fundus.  Duodenal bulb, D2 normal. Day 2 octreotide.  Day 2 Protonix gtt.  Day 2 Rocephin  Blood loss anemia.  Hgb 13.. 8.7..  10.9.  s/p 2 PRBC.   Thrombocytopenia.  Platelets 52... 68. S/p platelets.    Coagulopathy.  INR 1.6.. >10.. 1.7.  s/p 2 cryoprecipitate.    Cirrhosis of the liver, not attributed to alcohol, hepatitis.  IgA elevated but o/w normal AIH markers.      AKI    Ascites.  4 L paracentesis 10/16, no SBP.      Intubated for airway protection  PLAN     Continue supportive care.  If GI bleeding appears to resolve and not able to get off vent within the next 36 hours, question place core track for tube feedings?      Give IV Vit K for 3 d.  Follow CBC, INR (both pndg for this afternoon)   Azucena Freed  02/21/2022, 10:47 AM Phone [phone]    Attending Physician Note   I have taken an interval history, reviewed the chart and examined the patient. I performed a substantive portion of this encounter, including complete performance of at least one of the key components, in conjunction with the APP. I agree with the APP's note, impression and recommendations with my  edits. My additional impressions and recommendations are as follows.   Major variceal bleed with 6 bands placed yesterday. Gastric cardia and fundus were obscured by a large volume of blood. If bleeding recurs consult IR for consideration of TIPS.   Decompensated cirrhosis, etiology unclear, with ascites, portal hypertension, gastric and esophageal varices. 4 L paracentesis negative for SBP. Evaluated at [facility] hepatology and was felt not to be a candidate for transplant. [facility] hepatology referral was in process.   ABL anemia  Lucio Edward, MD [facility] See Shea Evans, [facility], for our on call provider

## 2022-02-21 NOTE — Consult Note (Signed)
Chief Complaint: Patient was seen in consultation today for GI bleed.  Referring Physician(s): Dr. Ernest Mallick  Supervising Physician: Sandi Mariscal  Patient Status: [name] - Out-pt  History of Present Illness: Edward [name] is a 71 y.o. male with past medical history of cirrhosis, CKD, MI, DM2, who presented to [facility] ED with hematemesis requiring MTP s/p 8u PRBCs, 2FFPs, and 2u platelets.  Intubated and sedated. On pressors.  Rectal tube in place with dark, melanotic stool. He did undergo EGD with identification of his Grade III esophageal varices with banding.  Large volume fresh blood with clots noted in the stomach. IR consulted for possible TIPS procedure.  Past Medical History:  Diagnosis Date   Cataract    CKD (chronic kidney disease) stage 3, GFR 30-59 ml/min (HCC)    Coronary atherosclerosis of native coronary artery    BMS LAD 2006; residual 75% distal RCA; EF 50%   Essential hypertension    Glaucoma    Low back pain    MI (myocardial infarction) ([place])    Anterior 2006   Mixed hyperlipidemia    Renal insufficiency    Type 2 diabetes mellitus ([place])     Past Surgical History:  Procedure Laterality Date   CATARACT EXTRACTION, BILATERAL     CORONARY ANGIOPLASTY WITH STENT PLACEMENT  2006   ESOPHAGEAL BANDING  02/01/2022   Procedure: ESOPHAGEAL BANDING;  Surgeon: Eloise Harman, DO;  Location: [facility] SUITE;  Service: Endoscopy;;   ESOPHAGOGASTRODUODENOSCOPY (EGD) WITH PROPOFOL N/A 02/01/2022   Procedure: ESOPHAGOGASTRODUODENOSCOPY (EGD) WITH PROPOFOL;  Surgeon: Eloise Harman, DO;  Location: [facility] ENDO SUITE;  Service: Endoscopy;  Laterality: N/A;  12:30 am    Allergies: Patient has no known allergies.  Medications: Prior to Admission medications   Medication Sig Start Date End Date Taking? Authorizing Provider  albuterol (VENTOLIN HFA) 108 (90 Base) MCG/ACT inhaler Inhale 1-2 puffs into the lungs every 6 (six) hours as needed for wheezing or shortness of breath.  01/23/22  Yes Triplett, Tammy, PA-C  brimonidine (ALPHAGAN) 0.2 % ophthalmic solution Place 1 drop into both eyes 2 (two) times daily. 01/09/22  Yes [provider]  furosemide (LASIX) 40 MG tablet Take 1 tablet (40 mg total) by mouth daily. 02/05/22 02/05/23 Yes Martin, Mary-Margaret, FNP  lactulose (CHRONULAC) 10 GM/15ML solution Take 15 mLs (10 g total) by mouth 2 (two) times daily. 02/09/22  Yes Hawks, Christy A, FNP  linagliptin (TRADJENTA) 5 MG TABS tablet Take 1 tablet (5 mg total) by mouth daily. 02/09/22  Yes Hawks, Christy A, FNP  midodrine (PROAMATINE) 10 MG tablet Take 2 tablets (20 mg total) by mouth 3 (three) times daily with meals. 01/12/22  Yes Hassell Done, Mary-Margaret, FNP  nitroGLYCERIN (NITROSTAT) 0.4 MG SL tablet Place 1 tablet (0.4 mg total) under the tongue every 5 (five) minutes x 3 doses as needed for chest pain (if no relief after 2nd dose, proceed to ED or call 911). 02/19/22  Yes Satira Sark, MD  pantoprazole (PROTONIX) 40 MG tablet Take 1 tablet (40 mg total) by mouth 2 (two) times daily. 02/01/22 02/01/23 Yes Carver, Elon Alas, DO  potassium chloride (KLOR-CON) 10 MEQ tablet Take 10 mEq by mouth daily. 02/05/22  Yes [provider]  sodium bicarbonate 650 MG tablet Take 1 tablet (650 mg total) by mouth 3 (three) times daily. 11/22/21  Yes Barton Dubois, MD  spironolactone (ALDACTONE) 25 MG tablet Take 1 tablet (25 mg total) by mouth daily. 02/08/22  Yes Domenic Polite,  MD  tamsulosin (FLOMAX) 0.4 MG CAPS capsule Take 1 capsule (0.4 mg total) by mouth daily after supper. 02/05/22  Yes Martin, Mary-Margaret, FNP  traZODone (DESYREL) 50 MG tablet Take 1 tablet (50 mg total) by mouth at bedtime. 02/05/22  Yes Hassell Done Mary-Margaret, FNP     Family History  Problem Relation Age of Onset   Aneurysm Father        Brain aneurysm   Lung cancer Mother        Small cell carcinoma of lung,kidney and heart   Heart disease Mother    Cancer Brother        lungs   Diabetes  Sister    Diabetes Sister    Diabetes Brother    Diabetes Brother     Social History   Socioeconomic History   Marital status: Legally Separated    Spouse name: [name]    Number of children: 2   Years of education: 7th   Highest education level: 7th grade  Occupational History   Occupation: Retired  Tobacco Use   Smoking status: Former    Packs/day: 2.00    Years: 20.00    Total pack years: 40.00    Types: Cigarettes    Quit date: 05/08/1987    Years since quitting: 34.8    Passive exposure: Past   Smokeless tobacco: Never  Vaping Use   Vaping Use: Never used  Substance and Sexual Activity   Alcohol use: No    Alcohol/week: 0.0 standard drinks of alcohol   Drug use: No   Sexual activity: Not Currently  Other Topics Concern   Not on file  Social History Narrative   Lives alone    Social Determinants of Health   Financial Resource Strain: Low Risk  (07/11/2021)   Overall Financial Resource Strain (CARDIA)    Difficulty of Paying Living Expenses: Not very hard  Food Insecurity: Unknown (02/13/2022)   Hunger Vital Sign    Worried About [place] in the Last Year: Patient refused    [place] in the Last Year: Patient refused  Transportation Needs: Unknown (02/13/2022)   Joyce - Hydrologist (Medical): Patient refused    Lack of Transportation (Non-Medical): Patient refused  Physical Activity: Insufficiently Active (07/11/2021)   Exercise Vital Sign    Days of Exercise per Week: 7 days    Minutes of Exercise per Session: 20 min  Stress: No Stress Concern Present (07/11/2021)   [place] of Stress : Not at all  Social Connections: Socially Isolated (07/11/2021)   Social Connection and Isolation Panel [NHANES]    Frequency of Communication with Friends and Family: More than three times a week    Frequency of Social Gatherings with Friends and Family:  More than three times a week    Attends Religious Services: Never    Marine scientist or Organizations: No    Attends Archivist Meetings: Never    Marital Status: Separated     Review of Systems: A 12 point ROS discussed and pertinent positives are indicated in the HPI above.  All other systems are negative.  Review of Systems  Unable to perform ROS: Acuity of condition    Vital Signs: BP (!) 110/53   Pulse 67   Temp (!) 97.3 F (36.3 C)   Resp 16   Ht '5\' 10"'$  (1.778 m)   Wt  142 lb (64.4 kg)   SpO2 100%   BMI 20.37 kg/m   Physical Exam Vitals and nursing note reviewed.  Constitutional:      Comments: Intubated, sedated  Cardiovascular:     Rate and Rhythm: Normal rate and regular rhythm.  Pulmonary:     Comments: Intubated. Abdominal:     General: There is no distension.     Comments: Rectal tube in place with dark, melanoic liquid stool.  Skin:    General: Skin is warm and dry.  Neurological:     Comments: sedated      MD Evaluation Airway: WNL Heart: WNL Abdomen: WNL Chest/ Lungs: WNL ASA  Classification: 4 Mallampati/Airway Score: Two   Imaging: DG Chest Port 1 View  Result Date: 02/21/2022 CLINICAL DATA:  Ventilator dependent respiratory failure. EXAM: PORTABLE CHEST 1 VIEW COMPARISON:  Portable chest yesterday at 10:23 p.m. FINDINGS: 4:59 a.m. ETT tip is 3.1 cm from the carina, left IJ central line tip in the distal SVC. There are multiple overlying monitor wires. There are low lung volumes. Mild elevation right hemidiaphragm limiting visualization of the right base. There is perihilar and right basilar linear atelectasis without focal consolidation the visualized lungs. The cardiomediastinal silhouette and vasculature are normal. There is thoracic spondylosis. IMPRESSION: Limited exam with low lung volumes and elevated right hemidiaphragm. Assessment of the bases is limited. The visualized lungs without focal infiltrates. Support  apparatus as above. Electronically Signed   By: Telford Nab M.D.   On: 02/21/2022 06:18   DG CHEST PORT 1 VIEW  Result Date: 02/20/2022 CLINICAL DATA:  Central line placement EXAM: PORTABLE CHEST 1 VIEW COMPARISON:  02/20/2022 FINDINGS: Left central line is been placed with the tip in the SVC. No pneumothorax. Endotracheal tube is 5 cm above the carina. Heart normal size. No confluent opacities or effusions. IMPRESSION: Support devices in expected position as above. No acute cardiopulmonary disease. Electronically Signed   By: Rolm Baptise M.D.   On: 02/20/2022 22:30   DG Chest Port 1 View  Result Date: 02/20/2022 CLINICAL DATA:  Intubation EXAM: PORTABLE CHEST 1 VIEW COMPARISON:  02/13/2022 FINDINGS: Endotracheal tube with tip just below the clavicular heads. The enteric tube at least reaches the stomach. Artifact from EKG leads. Low volume chest with streaky density at the bases suggesting atelectasis. No effusion or pneumothorax. Skin fold at the left apex. IMPRESSION: 1. Unremarkable hardware positioning. 2. Low volume chest with mild atelectasis. Electronically Signed   By: Jorje Guild M.D.   On: 02/20/2022 04:13   US Paracentesis  Result Date: 02/19/2022 INDICATION: Ascites secondary to cirrhosis EXAM: ULTRASOUND GUIDED  PARACENTESIS MEDICATIONS: None. COMPLICATIONS: None immediate. PROCEDURE: Informed written consent was obtained from the patient after a discussion of the risks, benefits and alternatives to treatment. A timeout was performed prior to the initiation of the procedure. Initial ultrasound scanning demonstrates a large amount of ascites within the right lower abdominal quadrant. The right lower abdomen was prepped and draped in the usual sterile fashion. 1% lidocaine was used for local anesthesia. Following this, a 19 gauge, 7-cm, Yueh catheter was introduced. An ultrasound image was saved for documentation purposes. The paracentesis was performed. The catheter was removed  and a dressing was applied. The patient tolerated the procedure well without immediate post procedural complication. Patient received post-procedure intravenous albumin; see nursing notes for details. FINDINGS: A total of approximately 4.0 L of serous fluid was removed. Samples were sent to the laboratory as requested by the clinical team.  IMPRESSION: Successful ultrasound-guided paracentesis yielding 4.0 L of peritoneal fluid. Electronically Signed   By: Abigail Miyamoto M.D.   On: 02/19/2022 12:13   US Paracentesis  Result Date: 02/14/2022 INDICATION: Cirrhosis with recurrent ascites EXAM: ULTRASOUND GUIDED RLQ PARACENTESIS MEDICATIONS: None. COMPLICATIONS: None immediate. PROCEDURE: Informed written consent was obtained from the patient after a discussion of the risks, benefits and alternatives to treatment. A timeout was performed prior to the initiation of the procedure. Initial ultrasound scanning demonstrates a large amount of ascites within the right lower abdominal quadrant. The right lower abdomen was prepped and draped in the usual sterile fashion. 1% lidocaine was used for local anesthesia. Following this, a 19 gauge, 7-cm, Yueh catheter was introduced. An ultrasound image was saved for documentation purposes. The paracentesis was performed. The catheter was removed and a dressing was applied. The patient tolerated the procedure well without immediate post procedural complication. Patient received post-procedure intravenous albumin; see nursing notes for details. FINDINGS: A total of approximately 3.4L of peritoneal fluid was removed. Samples were sent to the laboratory as requested by the clinical team. IMPRESSION: Successful ultrasound-guided paracentesis yielding 3.4 liters of peritoneal fluid. PLAN: The patient has required >/=2 paracenteses in a 30 day period and a formal evaluation by the [facility] has been arranged. Electronically Signed   By:  Lavonia Dana M.D.   On: 02/14/2022 10:56   DG Abdomen Acute W/Chest  Result Date: 02/13/2022 CLINICAL DATA:  Short of breath, abdominal pain. History of cirrhosis EXAM: DG ABDOMEN ACUTE WITH 1 VIEW CHEST COMPARISON:  Chest and abdomen 02/04/2022 FINDINGS: Heart size and vascularity normal. Mild bibasilar atelectasis. No pleural effusion Normal bowel gas pattern. No bowel obstruction or free air. Negative for urinary tract calculi. IMPRESSION: 1. Mild bibasilar atelectasis. 2. Normal bowel gas pattern. Electronically Signed   By: Franchot Gallo M.D.   On: 02/13/2022 16:21   US Paracentesis  Result Date: 02/05/2022 INDICATION: Cirrhosis, ascites, fever EXAM: ULTRASOUND GUIDED DIAGNOSTIC AND THERAPEUTIC PARACENTESIS MEDICATIONS: None. COMPLICATIONS: None immediate. PROCEDURE: Informed written consent was obtained from the patient after a discussion of the risks, benefits and alternatives to treatment. A timeout was performed prior to the initiation of the procedure. Initial ultrasound scanning demonstrates a large amount of ascites within the right lower abdominal quadrant. The right lower abdomen was prepped and draped in the usual sterile fashion. 1% lidocaine was used for local anesthesia. Following this, a 19 gauge, 7-cm, Yueh catheter was introduced. An ultrasound image was saved for documentation purposes. The paracentesis was performed. The catheter was removed and a dressing was applied. The patient tolerated the procedure well without immediate post procedural complication. Patient received post-procedure intravenous albumin; see nursing notes for details. FINDINGS: A total of approximately 4.5 L of clear yellow ascitic fluid was removed. Samples were sent to the laboratory as requested by the clinical team. IMPRESSION: Successful ultrasound-guided paracentesis yielding 4.5 liters of peritoneal fluid. Electronically Signed   By: Lavonia Dana M.D.   On: 02/05/2022 09:54   CT ABDOMEN PELVIS WO  CONTRAST  Result Date: 02/04/2022 CLINICAL DATA:  Bowel obstruction suspected EXAM: CT ABDOMEN AND PELVIS WITHOUT CONTRAST TECHNIQUE: Multidetector CT imaging of the abdomen and pelvis was performed following the standard protocol without IV contrast. RADIATION DOSE REDUCTION: This exam was performed according to the departmental dose-optimization program which includes automated exposure control, adjustment of the mA and/or kV according to patient size and/or use of iterative reconstruction technique. COMPARISON:  CT abdomen pelvis  11/24/2021 FINDINGS: Lower chest: Trace bilateral pleural effusions. Thickening of the visualized distal esophagus likely due to paraseptal varices. Four-vessel coronary artery calcification. Hepatobiliary: Nodular hepatic contour. Enlarged caudate lobe. No focal liver abnormality. Calcified gallstones noted within the gallbladder lumen. No gallbladder wall thickening or pericholecystic fluid. No biliary dilatation. Pancreas: No focal lesion. Normal pancreatic contour. No surrounding inflammatory changes. No main pancreatic ductal dilatation. Spleen: The spleen is enlarged in size measuring up to 14 cm. No focal splenic lesion. Adrenals/Urinary Tract: No adrenal nodule bilaterally. No nephrolithiasis and no hydronephrosis. No definite contour-deforming renal mass. No ureterolithiasis or hydroureter. The urinary bladder is unremarkable. Stomach/Bowel: Stomach is within normal limits. No evidence of bowel wall thickening or dilatation. Portal colopathy of the ascending colon. Sigmoid diverticulosis. Appendix appears normal. Vascular/Lymphatic: No abdominal aorta or iliac aneurysm. Mild to moderate atherosclerotic plaque of the aorta and its branches. No abdominal, pelvic, or inguinal lymphadenopathy. Reproductive: Prostate is unremarkable. Other: Interval slight increase in moderate volume free intraperitoneal fluid. Esophageal and paraesophageal venous collaterals. No intraperitoneal  free gas. No organized fluid collection. Musculoskeletal: No abdominal wall hernia or abnormality. No suspicious lytic or blastic osseous lesions. No acute displaced fracture. Multilevel degenerative changes of the spine. IMPRESSION: 1. Trace bilateral pleural effusions. 2. Slight interval increase in size of moderate volume ascites. 3. Cirrhosis with portal hypertension. No focal liver lesions identified. Please note that liver protocol enhanced MR and CT are the most sensitive tests for the screening detection of hepatocellular carcinoma in the high risk setting of cirrhosis. 4. Thickening of the visualized distal esophagus likely due to esophageal and paraesophageal varices. Markedly limited evaluation due to noncontrast study. 5. Portal colopathy of the ascending colon. 6. Cholelithiasis with no CT finding of acute cholecystitis. 7. Colonic diverticulosis with no acute diverticulitis. 8. Aortic Atherosclerosis (ICD10-I70.0) including four-vessel coronary calcification. Electronically Signed   By: Iven Finn M.D.   On: 02/04/2022 16:10   DG Abdomen Acute W/Chest  Result Date: 02/04/2022 CLINICAL DATA:  Cough. Stomach distention. Fever, chills, and fatigue. Decreased urinary output. EXAM: DG ABDOMEN ACUTE WITH 1 VIEW CHEST COMPARISON:  Chest radiographs 01/23/2022 and 12/08/2021. CT abdomen and pelvis 11/24/2021 FINDINGS: Cardiac silhouette and mediastinal contours are within normal limits. Moderately decreased lung volumes, mildly worsened from 01/23/2022 most recent prior chest radiograph. Right-greater-than-left horizontal linear basilar likely subsegmental atelectasis is mildly increased from 01/22/2022. No pleural effusion or pneumothorax. Moderate stool within the ascending and transverse colon. Air is seen within loops of small bowel as well as the ascending, transverse, descending colon, and rectum. On supine view, there is a single loop of vertically oriented bowel overlying the lateral left  hemiabdomen that is mildly distended, measuring up to 3.5 cm. Mild-to-moderate multilevel degenerative disc changes of the visualized thoracic and lumbar spine. Mild levocurvature of the upper lumbar spine. No subdiaphragmatic free air on upright view. IMPRESSION: 1. Right-greater-than-left horizontal linear basilar likely subsegmental atelectasis. 2. Moderate stool within the ascending and transverse colon. 3. On supine view, there is a single loop of vertically oriented bowel overlying the lateral left hemiabdomen that is mildly distended. This may represent a focal ileus. Air is seen within the more distal colon. Electronically Signed   By: Yvonne Kendall M.D.   On: 02/04/2022 15:02   US Paracentesis  Result Date: 01/23/2022 INDICATION: Ascites. EXAM: ULTRASOUND GUIDED DIAGNOSTIC AND THERAPEUTIC PARACENTESIS MEDICATIONS: None. COMPLICATIONS: None immediate. PROCEDURE: Informed written consent was obtained from the patient after a discussion of the risks, benefits and alternatives to  treatment. A timeout was performed prior to the initiation of the procedure. Initial ultrasound scanning demonstrates a large amount of ascites within the right lower abdominal quadrant. The right lower abdomen was prepped and draped in the usual sterile fashion. 1% lidocaine was used for local anesthesia. Following this, a 19 gauge, 7-cm, Yueh catheter was introduced. An ultrasound image was saved for documentation purposes. The paracentesis was performed. The catheter was removed and a dressing was applied. The patient tolerated the procedure well without immediate post procedural complication. FINDINGS: A total of approximately 4.9 L of clear yellow fluid was removed. Samples were sent to the laboratory as requested by the clinical team. IMPRESSION: 1. Successful ultrasound-guided paracentesis yielding 4.9 liters of peritoneal fluid. Electronically Signed   By: Titus Dubin M.D.   On: 01/23/2022 15:29   DG Chest 2  View  Result Date: 01/23/2022 CLINICAL DATA:  Provided history: Shortness of breath. EXAM: CHEST - 2 VIEW COMPARISON:  Prior chest radiographs 12/08/2021 and earlier. FINDINGS: Shallow inspiration radiograph. Heart size within normal limits. Aortic atherosclerosis. Mild ill-defined opacity within the bilateral lung bases, with an appearance favoring atelectasis. No sizable pleural effusion. No evidence of pneumothorax. Degenerative changes of the spine. IMPRESSION: Shallow inspiration radiograph. Mild ill-defined opacity within the bilateral lung bases, with an appearance favoring atelectasis. Aortic Atherosclerosis (ICD10-I70.0). Electronically Signed   By: Kellie Simmering D.O.   On: 01/23/2022 11:46    Labs:  CBC: Recent Labs    02/14/22 0408 02/20/22 0142 02/20/22 0600 02/20/22 1540 02/20/22 1848 02/20/22 2209 02/21/22 0440  WBC 4.0 5.4  --   --   --  14.3* 10.3  HGB 9.6* 11.0*   < > 11.1* 10.6* 8.7* 10.9*  HCT 27.2* 32.2*   < > 31.2* 29.9* 24.8* 29.4*  PLT 68* 52*  --   --   --  75* 68*   < > = values in this interval not displayed.    COAGS: Recent Labs    02/13/22 1920 02/20/22 0142 02/20/22 2136 02/21/22 0440  INR 1.5* 1.6* >10.0* 1.7*  APTT  --   --  >200* 35    BMP: Recent Labs    02/13/22 1450 02/14/22 0408 02/20/22 0142 02/20/22 0638 02/21/22 0440  NA 129* 130* 133* 131* 135  K 4.0 3.5 4.8 3.8 4.9  CL 91* 95* 93*  --  99  CO2 '26 26 29  '$ --  22  GLUCOSE 333* 181* 198*  --  243*  BUN 30* 30* 26*  --  35*  CALCIUM 8.6* 8.3* 9.1  --  8.4*  CREATININE 2.10* 1.87* 2.30*  --  2.78*  GFRNONAA 33* 38* 30*  --  24*    LIVER FUNCTION TESTS: Recent Labs    02/13/22 1450 02/14/22 0408 02/20/22 0142 02/21/22 0440  BILITOT 3.9* 3.8* 3.1* 4.9*  AST 84* 59* 57* 31  ALT 75* 58* 49* 24  ALKPHOS 90 68 81 38  PROT 6.3* 5.3* 6.7 4.4*  ALBUMIN 3.0* 2.5* 3.5 2.5*    TUMOR MARKERS: No results for input(s): "AFPTM", "CEA", "CA199", "CHROMGRNA" in the last 8760  hours.  Assessment and Plan: GI bleed Patient intubated, sedated.  Currently on pressors.  Moderate ascites noted on CT scan.  Patient critically ill s/p MTP for hematemesis with large volume fresh and clotted blood in stomach noted on EGD.   MeldNa 30.  Case reviewed by Dr. Pascal Lux who notes patient acutely ill, but stable on current interventions.  He continues to have  melena which is expected given the amount of blood described in the stomach.  Recommends continuing current management with close monitoring.  If s/s of rebleeding, IR will reconsider for TIPS.   Thank you for this interesting consult.  I greatly enjoyed meeting Edward [name] and look forward to participating in their care.  A copy of this report was sent to the requesting provider on this date.  Electronically Signed: Docia Barrier, PA 02/21/2022, 2:32 PM   I spent a total of 40 Minutes    in face to face in clinical consultation, greater than 50% of which was counseling/coordinating care for GI bleed.

## 2022-02-21 NOTE — Progress Notes (Signed)
NAME:  Edward [name], MRN:  [id number], DOB:  Feb 22, 1951, LOS: 1 ADMISSION DATE:  02/20/2022, CONSULTATION DATE:  02/20/22 REFERRING MD:  Karle Starch CHIEF COMPLAINT:  GIB   History of Present Illness:  Edward [name] is a 71 y.o. male who has a PMH as below including but not limited to cirrhosis with ascites undergoing serial paracenteses, portal hypertension with paraesophageal and perigastric varices (s/p esophageal banding 02/01/22 after surveillance EGD), concern for Wilson's disease.  He was apparently evaluated at [facility] for transplant but was deemed to not be a candidate. He presented to [facility] 10/16 with cramping of the hands and feet.  He had had a paracentesis earlier that day with 4 L removed (see below).  While in ED, he began to spit up bright red blood.  This progressed to frank hematemesis, large-volume and persistent.  He was started on Protonix, octreotide, ceftriaxone.  He then became hypotensive; therefore, massive transfusion protocol was initiated.  He received 8 units PRBC, 2 units FFP, 1 unit platelets.  He was intubated for airway protection was also started on Levophed.  Of note, he had an EGD 9/28 that showed no active bleeding but did show esophageal and gastric varices with banding of 3 esophageal varices.  Due to large-volume hematemesis and hemodynamic instability, he was transferred to [facility] for further evaluation by intensivist and GI services.  GI was consulted and agreed to scope him, this is still pending.  His repeat hemoglobin following transfusion is 12.9 (drawn around 0630).  He is currently on 5 of Levophed and has stable vital signs.  He does have an OG tube in place and has ongoing bleeding with bright red blood.  OG tube on low intermittent suction.  Wife denies any antiplatelet, NSAID, anticoagulation.  He was previously on aspirin; however, this was discontinued by cardiology due to progressive liver disease. He does not drink alcohol anymore, quit some 30 year  ago but per wife, was not heavy drinker prior to that.  He had recent admission 02/13/2022 through 02/14/2022 for dyspnea and abdominal distention secondary to ascites.  He underwent paracentesis/11 with 3.4 L removed.  Fluid was negative for SBP.  He was discharged with instructions for repeat paracentesis as an outpatient following discharge.  Per notes, it appears that he underwent repeat paracentesis on 10/16 with 4 L removed.  Pertinent  Medical History:  has Mixed hyperlipidemia; Coronary atherosclerosis of native coronary artery; CKD (chronic kidney disease) stage 3B, GFR 30-59 ml/min ([place]); Insomnia; Essential hypertension; Gastroesophageal reflux disease without esophagitis; Type 2 diabetes with nephropathy ([place]); Abnormal liver function tests; Gallstones with acute cholecystitis; Acute Calculous cholecystitis with Elevated LFTs; Thrombocytopenia ([place]); Iron deficiency anemia; Non-traumatic rhabdomyolysis; Decompensated hepatic cirrhosis ([place]); Shortness of breath; Other ascites; Abnormal CT scan, esophagus; Chronic cholecystitis; Pneumonia; Hypoglycemia; Anasarca; Hypoalbuminemia; AKI (acute kidney injury) ([place]); Massive volume overload; Hypokalemia; Secondary esophageal varices without bleeding ([place]); Fever; Sepsis secondary to UTI ([place]); Cirrhosis of liver with ascites ([place]); Anemia of chronic disease; GI bleed; Bleeding esophageal varices ([place]); Hematemesis; Hemorrhagic shock ([place]); Respiratory insufficiency; and Pressure injury of skin on their problem list.  Significant Hospital Events: Including procedures, antibiotic start and stop dates in addition to other pertinent events   10/17 admit, EGD  Interim History / Subjective:  Overnight received 2 units cryo, 2 units pRBCs, 1 unit platelets for coagulopathy. Still having bloody stools.   Objective:  Blood pressure 118/71, pulse 87, temperature 99.1 F (37.3 C), temperature source Bladder, resp. rate 17, height  $'5\' 10"'k$  (1.778 m), weight  64.4 kg, SpO2 100 %. CVP:  [4 mmHg-5 mmHg] 4 mmHg  Vent Mode: PRVC FiO2 (%):  [40 %] 40 % Set Rate:  [16 bmp] 16 bmp Vt Set:  [580 mL] 580 mL PEEP:  [5 cmH20] 5 cmH20 Plateau Pressure:  [15 cmH20-16 cmH20] 16 cmH20   Intake/Output Summary (Last 24 hours) at 02/21/2022 0823 Last data filed at 02/21/2022 0600 Gross per 24 hour  Intake 6382.85 ml  Output 465 ml  Net 5917.85 ml    Filed Weights   02/19/22 2158  Weight: 64.4 kg    Examination: General: critically ill appearing man lying in bed in NAD, intubated and sedated Neuro: RASS -5, pinpoint pupils HEENT: [place]/AT, eyes icteric Cardiovascular: S1S2, RRR Lungs: CTAB, synchronous with MV, no ETT secretions. Abdomen: soft, NT. Dark liquid bloody stools from rectal tube Musculoskeletal: minimal muscle mass, no cyanosis or edema Skin: warm, dry, no rashes. Spider angiomas, bruising on arms.   Fibrinogen 258 INR 1.7 WBC 10.3 H/H 10.9/29.4 Platelets 68 BUN 35 Cr 2.78 T bili 4.9 Peritoneal fluid> GNTD  Resolved problem list:    Assessment & Plan:   Acute upper GI bleed 2/2 esophageal varices.  Of note, he is s/p EGD 9/28 with banding of 3 esophageal varices (non-bleeding). S/p 6 bands on 10/18.  MELD-Na= 30 (a/w 27-32% mortality in the next 90 days) Cirrhosis with ascites (unclear etiology), concern for Wilsons disease (low ceruloplasmin and elevated 24 hour urine copper - Appreciate GI's management - reviewed case with IR regarding TIPS. Baseline  -additional platelets this morning -con't PPI & octreotide infusions -ceftriaxone 1g SBP prophylaxis -monitor for ongoing bleeding; transfuse for hemodynamic stability -Discussed possibility of TIPS with IR; need to address goals of care with family given poor baseline recently.  Acute blood loss anemia and hemorrhagic shock - 2/2 above. Thrombocytopenia- acute on chronic - remains on low dose norepi to maintain MAP >65 -resume PTA midodrine when able to get enteral  access -volume resuscitation with blood for ongoing bleeding; needs matched transfusions  Consumptive coagulopathy -monitor -transfuse for active bleeding -recheck coags and CBC this afternoon  Hypocalcemia -replete -con't to monitor  Acute respiratory failure with hypoxia - LTVV -VAP prevention protocol -PAD protocol for sedation -daily SAT & SBT as appropriate; need to ensure bleeding is controlled   AKI on CKD 3b - likely Hepatorenal syndrome -strict I/Os -renally dose meds, avoid nephrotoxic meds -maintain adequate renal preload  Hx CAD, MI HTN, HLD -con't holding PTA furosemide, spironolactone, NTG  Hx DM, uncontrolled hyperglycemia -hold PTA linagliptin -adding semglee 10 units daily -SSI PRN -goal BG 140-180   GOC -Palliative care consult today  Best practice (evaluated daily):  Diet/type: NPO DVT prophylaxis: not indicated GI prophylaxis: PPI Lines: N/A Foley:  N/A Code Status:  full code Last date of multidisciplinary goals of care discussion:    Labs   CBC: Recent Labs  Lab 02/20/22 0142 02/20/22 0600 02/20/22 0956 02/20/22 1540 02/20/22 1848 02/20/22 2209 02/21/22 0440  WBC 5.4  --   --   --   --  14.3* 10.3  NEUTROABS  --   --   --   --   --   --  8.3*  HGB 11.0*   < > 13.5 11.1* 10.6* 8.7* 10.9*  HCT 32.2*   < > 37.1* 31.2* 29.9* 24.8* 29.4*  MCV 101.9*  --   --   --   --  91.5 84.7  PLT 52*  --   --   --   --  75* 68*   < > = values in this interval not displayed.     Basic Metabolic Panel: Recent Labs  Lab 02/20/22 0142 02/20/22 0638 02/21/22 0440  NA 133* 131* 135  K 4.8 3.8 4.9  CL 93*  --  99  CO2 29  --  22  GLUCOSE 198*  --  243*  BUN 26*  --  35*  CREATININE 2.30*  --  2.78*  CALCIUM 9.1  --  8.4*  MG  --   --  1.7  PHOS  --   --  5.8*    GFR: Estimated Creatinine Clearance: 22.8 mL/min (A) (by C-G formula based on SCr of 2.78 mg/dL (H)). Recent Labs  Lab 02/20/22 0142 02/20/22 2209 02/21/22 0440  WBC 5.4  14.3* 10.3     Liver Function Tests: Recent Labs  Lab 02/20/22 0142 02/21/22 0440  AST 57* 31  ALT 49* 24  ALKPHOS 81 38  BILITOT 3.1* 4.9*  PROT 6.7 4.4*  ALBUMIN 3.5 2.5*     This patient is critically ill with multiple organ system failure which requires frequent high complexity decision making, assessment, support, evaluation, and titration of therapies. This was completed through the application of advanced monitoring technologies and extensive interpretation of multiple databases. During this encounter critical care time was devoted to patient care services described in this note for 50 minutes.  Julian Hy, DO 02/21/22 8:54 AM [facility]

## 2022-02-21 NOTE — Progress Notes (Signed)
eLink Physician-Brief Progress Note Patient Name: TREYTON SLIMP DOB: Feb 07, 1951 MRN: [id number]   Date of Service  02/21/2022  HPI/Events of Note  Thrombocytopenia - Platelet count = 75 in setting of active variceal bleeding.   eICU Interventions  Will transfuse 1 unit single donor platelets now.      Intervention Category Major Interventions: Other:  Lysle Dingwall 02/21/2022, 12:52 AM

## 2022-02-21 NOTE — Progress Notes (Signed)
Video chat with wife

## 2022-02-21 NOTE — IPAL (Signed)
  Interdisciplinary Goals of Care Family Meeting   Date carried out:: 02/21/2022  Location of the meeting: Conference room  Member's involved: Physician and Family Member or next of kin. Palliative medicine had just completed a meeting with them.  Durable Power of Tour manager: Wife    Discussion: We discussed goals of care for [employer] .  I met with Mr. Dwan wife and siblings. They understand how ill he is and are hopeful he will make a full recovery. They understand if he rebleeds he will need a TIPS for salvage therapy, but this can come with complications as well. Con't full scope of care.  Code status: Full Code  Disposition: Continue current acute care   Time spent for the meeting: 15 min.  Julian Hy 02/21/2022, 4:17 PM

## 2022-02-21 NOTE — Progress Notes (Signed)
Initial Nutrition Assessment  DOCUMENTATION CODES:   Severe malnutrition in context of chronic illness  INTERVENTION:   - Given severe malnutrition and inability to obtain enteral access, recommend initiation of TPN if within GOC  NUTRITION DIAGNOSIS:   Severe Malnutrition related to chronic illness (cirrhosis) as evidenced by moderate fat depletion, severe muscle depletion, percent weight loss (24.6% weight loss in less than 3 months).  GOAL:   Patient will meet greater than or equal to 90% of their needs  MONITOR:   Vent status, Labs, Weight trends, Skin, I & O's  REASON FOR ASSESSMENT:   Ventilator    ASSESSMENT:   71 year old male who presented to the ED on 10/16. PMH of CAD, CKD stage IIIb, cirrhosis with ascites requiring frequent paracenteses, T2DM, copper deficiency. Pt with sudden onset hematemesis in the ED and required intubation. Pt admitted with large volume upper GI bleed, hemorrhagic shock.  10/17 - s/p EGD showing actively bleeding esophageal varices s/p 6 bands  Discussed pt with RN and during ICU rounds. Plan is for Palliative Medicine to meet with family to discuss [place]. Per notes, pt with poor baseline recently and may not be a candidate for TIPS with IR.  Pt without enteral access. Per RN, GI does not want to place OG or NG tube at this time due to recent bleeding and banding. Given severe malnutrition and inability to obtain enteral access, recommend initiation of TPN if within [place].  Unable to obtain diet and weight history at this time. Reviewed weight history in chart. Pt with a 21 kg weight loss since 11/27/21. This is a 24.6% weight loss in less than 3 months which is severe and significant for timeframe. Suspect some of weight fluctuations may be attributed to frequent paracenteses; however, overall trend is down. Pt meets criteria for severe malnutrition.  Patient is currently intubated on ventilator support MV: 8.9 L/min Temp (24hrs), Avg:98.2 F  (36.8 C), Min:96.6 F (35.9 C), Max:99.3 F (37.4 C) BP (a-line): 136/58 MAP (a-line): 81  Drips: Propofol: 11.59 ml/hr (provides 306 kcal daily from lipid) Levophed Octreotide Protonix  Medications reviewed and include: SSI q 4 hours, semglee 10 units daily, IV abx, IV vitamin K 5 mg daily  Labs reviewed: BUN 35, creatinine 2.78, ionized calcium 0.84, phosphorus 5.8, WBC 10.8, hemoglobin 9.9, platelets 72, copper 57 (L) on 11/18/21 CBG's: 201-304 x 24 hours  UOP: 465 ml x 24 hours I/O's: +8.9 L since admit  NUTRITION - FOCUSED PHYSICAL EXAM:  Flowsheet Row Most Recent Value  Orbital Region Moderate depletion  Upper Arm Region Mild depletion  Thoracic and Lumbar Region Moderate depletion  Buccal Region Unable to assess  Temple Region Severe depletion  Clavicle Bone Region Severe depletion  Clavicle and Acromion Bone Region Severe depletion  Scapular Bone Region Moderate depletion  Dorsal Hand Moderate depletion  Patellar Region Moderate depletion  Anterior Thigh Region Severe depletion  Posterior Calf Region Severe depletion  Edema (RD Assessment) Mild  [BLE]  Hair Reviewed  Eyes Reviewed  Mouth Reviewed  Skin Reviewed  Nails Reviewed       Diet Order:   Diet Order             Diet NPO time specified  Diet effective now                   EDUCATION NEEDS:   No education needs have been identified at this time  Skin:  Skin Assessment: Skin Integrity Issues: Stage I: coccyx, L  heel  Last BM:  02/21/22 large red type 7  Height:   Ht Readings from Last 1 Encounters:  02/19/22 '5\' 10"'$  (1.778 m)    Weight:   Wt Readings from Last 1 Encounters:  02/19/22 64.4 kg    BMI:  Body mass index is 20.37 kg/m.  Estimated Nutritional Needs:   Kcal:  1800-2000  Protein:  85-100 grams  Fluid:  >1.8 L    Gustavus Bryant, MS, RD, LDN Inpatient Clinical Dietitian Please see AMiON for contact information.

## 2022-02-22 ENCOUNTER — Telehealth: Payer: Self-pay | Admitting: Nurse Practitioner

## 2022-02-22 DIAGNOSIS — I8501 Esophageal varices with bleeding: Secondary | ICD-10-CM | POA: Diagnosis not present

## 2022-02-22 DIAGNOSIS — K920 Hematemesis: Secondary | ICD-10-CM | POA: Diagnosis not present

## 2022-02-22 DIAGNOSIS — E43 Unspecified severe protein-calorie malnutrition: Secondary | ICD-10-CM | POA: Diagnosis not present

## 2022-02-22 DIAGNOSIS — G9340 Encephalopathy, unspecified: Secondary | ICD-10-CM | POA: Diagnosis not present

## 2022-02-22 DIAGNOSIS — Z7189 Other specified counseling: Secondary | ICD-10-CM | POA: Diagnosis not present

## 2022-02-22 DIAGNOSIS — Z515 Encounter for palliative care: Secondary | ICD-10-CM | POA: Diagnosis not present

## 2022-02-22 DIAGNOSIS — J9601 Acute respiratory failure with hypoxia: Secondary | ICD-10-CM

## 2022-02-22 DIAGNOSIS — D62 Acute posthemorrhagic anemia: Secondary | ICD-10-CM

## 2022-02-22 DIAGNOSIS — Z9911 Dependence on respirator [ventilator] status: Secondary | ICD-10-CM

## 2022-02-22 LAB — PREPARE PLATELET PHERESIS
Unit division: 0
Unit division: 0
Unit division: 0

## 2022-02-22 LAB — CBC
HCT: 27.8 % — ABNORMAL LOW (ref 39.0–52.0)
HCT: 29.6 % — ABNORMAL LOW (ref 39.0–52.0)
HCT: 30.3 % — ABNORMAL LOW (ref 39.0–52.0)
HCT: 29.3 % — ABNORMAL LOW (ref 39.0–52.0)
Hemoglobin: 9.8 g/dL — ABNORMAL LOW (ref 13.0–17.0)
Hemoglobin: 10.4 g/dL — ABNORMAL LOW (ref 13.0–17.0)
Hemoglobin: 10.8 g/dL — ABNORMAL LOW (ref 13.0–17.0)
Hemoglobin: 10.3 g/dL — ABNORMAL LOW (ref 13.0–17.0)
MCH: 30.5 pg (ref 26.0–34.0)
MCH: 30.4 pg (ref 26.0–34.0)
MCH: 31.6 pg (ref 26.0–34.0)
MCH: 31.7 pg (ref 26.0–34.0)
MCHC: 35.3 g/dL (ref 30.0–36.0)
MCHC: 35.1 g/dL (ref 30.0–36.0)
MCHC: 35.6 g/dL (ref 30.0–36.0)
MCHC: 35.2 g/dL (ref 30.0–36.0)
MCV: 86.6 fL (ref 80.0–100.0)
MCV: 86.5 fL (ref 80.0–100.0)
MCV: 88.6 fL (ref 80.0–100.0)
MCV: 90.2 fL (ref 80.0–100.0)
Platelets: 60 10*3/uL — ABNORMAL LOW (ref 150–400)
Platelets: 52 10*3/uL — ABNORMAL LOW (ref 150–400)
Platelets: 42 10*3/uL — ABNORMAL LOW (ref 150–400)
Platelets: 41 10*3/uL — ABNORMAL LOW (ref 150–400)
RBC: 3.21 MIL/uL — ABNORMAL LOW (ref 4.22–5.81)
RBC: 3.42 MIL/uL — ABNORMAL LOW (ref 4.22–5.81)
RBC: 3.42 MIL/uL — ABNORMAL LOW (ref 4.22–5.81)
RBC: 3.25 MIL/uL — ABNORMAL LOW (ref 4.22–5.81)
RDW: 16.2 % — ABNORMAL HIGH (ref 11.5–15.5)
RDW: 16.5 % — ABNORMAL HIGH (ref 11.5–15.5)
RDW: 16.4 % — ABNORMAL HIGH (ref 11.5–15.5)
RDW: 16.3 % — ABNORMAL HIGH (ref 11.5–15.5)
WBC: 8.5 10*3/uL (ref 4.0–10.5)
WBC: 7.5 10*3/uL (ref 4.0–10.5)
WBC: 6.3 10*3/uL (ref 4.0–10.5)
WBC: 5.7 10*3/uL (ref 4.0–10.5)
nRBC: 0 % (ref 0.0–0.2)
nRBC: 0 % (ref 0.0–0.2)
nRBC: 0 % (ref 0.0–0.2)
nRBC: 0 % (ref 0.0–0.2)

## 2022-02-22 LAB — MAGNESIUM: Magnesium: 2.2 mg/dL (ref 1.7–2.4)

## 2022-02-22 LAB — BASIC METABOLIC PANEL
Anion gap: 14 (ref 5–15)
BUN: 54 mg/dL — ABNORMAL HIGH (ref 8–23)
CO2: 21 mmol/L — ABNORMAL LOW (ref 22–32)
Calcium: 8.4 mg/dL — ABNORMAL LOW (ref 8.9–10.3)
Chloride: 101 mmol/L (ref 98–111)
Creatinine, Ser: 2.97 mg/dL — ABNORMAL HIGH (ref 0.61–1.24)
GFR, Estimated: 22 mL/min — ABNORMAL LOW (ref >60)
Glucose, Bld: 193 mg/dL — ABNORMAL HIGH (ref 70–99)
Potassium: 4.3 mmol/L (ref 3.5–5.1)
Sodium: 136 mmol/L (ref 135–145)

## 2022-02-22 LAB — PREPARE CRYOPRECIPITATE
Unit division: 0
Unit division: 0

## 2022-02-22 LAB — BPAM PLATELET PHERESIS
Blood Product Expiration Date: 202310192359
Blood Product Expiration Date: 202310212359
Blood Product Expiration Date: 202310212359
ISSUE DATE / TIME: 202310180106
ISSUE DATE / TIME: 202310181033
ISSUE DATE / TIME: 202310181304
Unit Type and Rh: 5100
Unit Type and Rh: 6200
Unit Type and Rh: 6200

## 2022-02-22 LAB — GLUCOSE, CAPILLARY
Glucose-Capillary: 168 mg/dL — ABNORMAL HIGH (ref 70–99)
Glucose-Capillary: 184 mg/dL — ABNORMAL HIGH (ref 70–99)
Glucose-Capillary: 183 mg/dL — ABNORMAL HIGH (ref 70–99)
Glucose-Capillary: 178 mg/dL — ABNORMAL HIGH (ref 70–99)
Glucose-Capillary: 190 mg/dL — ABNORMAL HIGH (ref 70–99)
Glucose-Capillary: 173 mg/dL — ABNORMAL HIGH (ref 70–99)
Glucose-Capillary: 163 mg/dL — ABNORMAL HIGH (ref 70–99)

## 2022-02-22 LAB — PHOSPHORUS: Phosphorus: 5.5 mg/dL — ABNORMAL HIGH (ref 2.5–4.6)

## 2022-02-22 LAB — BPAM CRYOPRECIPITATE
Blood Product Expiration Date: 202310180601
Blood Product Expiration Date: 202310180926
ISSUE DATE / TIME: 202310180025
ISSUE DATE / TIME: 202310180401
Unit Type and Rh: 6200
Unit Type and Rh: 6200

## 2022-02-22 LAB — PROTIME-INR
INR: 1.6 — ABNORMAL HIGH (ref 0.8–1.2)
Prothrombin Time: 18.5 seconds — ABNORMAL HIGH (ref 11.4–15.2)

## 2022-02-22 LAB — AMMONIA: Ammonia: 91 umol/L — ABNORMAL HIGH (ref 9–35)

## 2022-02-22 LAB — CALCIUM, IONIZED: Calcium, Ionized, Serum: <3 mg/dL — ABNORMAL LOW (ref 4.5–5.6)

## 2022-02-22 MED ORDER — LACTULOSE ENEMA
300.0000 mL | Freq: Three times a day (TID) | ORAL | Status: DC
  Administered 2022-02-22 – 2022-02-23 (×3): 300 mL via RECTAL
  Filled 2022-02-22 (×6): qty 300

## 2022-02-22 MED ORDER — MUPIROCIN 2 % EX OINT
1.0000 | TOPICAL_OINTMENT | Freq: Two times a day (BID) | CUTANEOUS | Status: AC
  Administered 2022-02-22 – 2022-02-26 (×10): 1 via NASAL
  Filled 2022-02-22 (×2): qty 22

## 2022-02-22 MED ORDER — ORAL CARE MOUTH RINSE
15.0000 mL | OROMUCOSAL | Status: DC
  Administered 2022-02-22 (×3): 15 mL via OROMUCOSAL

## 2022-02-22 MED ORDER — SODIUM CHLORIDE 0.9% IV SOLUTION: Freq: Once | INTRAVENOUS | Status: DC

## 2022-02-22 NOTE — Progress Notes (Addendum)
[facility] Progress Note  CC:  Variceal bleed   Subjective:  Was extubated today.  Wants something to drink.  Still with maroon/burgundy liquid stool in flexi-seal bag.  Pressors have been stopped as well.  Objective:  Vital signs in last 24 hours: Temp:  [97 F (36.1 C)-98.4 F (36.9 C)] 98.4 F (36.9 C) (10/19 0730) Pulse Rate:  [60-94] 89 (10/19 0740) Resp:  [0-31] 16 (10/19 0740) BP: (95-110)/(53-66) 105/56 (10/19 0400) SpO2:  [100 %] 100 % (10/19 1055) Arterial Line BP: (97-177)/(10-60) 125/50 (10/19 0730) FiO2 (%):  [40 %] 40 % (10/19 0740) Weight:  [73.1 kg] 73.1 kg (10/19 0409) Last BM Date : 02/21/22 General:  Alert, chronically ill-appearing, in NAD Heart:  Regular rate and rhythm; no murmurs Pulm:  CTAB.  No W/R/R. Abdomen:  Soft, distended.  BS present.  Non-tender. Extremities:  Without edema.  Intake/Output from previous day: 10/18 0701 - 10/19 0700 In: 2226.3 [I.V.:1659.2; Blood:329.2; IV Piggyback:237.9] Out: 830 [Urine:505; Stool:325]  Lab Results: Recent Labs    02/21/22 1820 02/22/22 0114 02/22/22 0530  WBC 10.0 8.5 7.5  HGB 10.0* 9.8* 10.4*  HCT 28.0* 27.8* 29.6*  PLT 66* 60* 52*   BMET Recent Labs    02/20/22 0142 02/20/22 0638 02/21/22 0440 02/22/22 0530  NA 133* 131* 135 136  K 4.8 3.8 4.9 4.3  CL 93*  --  99 101  CO2 29  --  22 21*  GLUCOSE 198*  --  243* 193*  BUN 26*  --  35* 54*  CREATININE 2.30*  --  2.78* 2.97*  CALCIUM 9.1  --  8.4* 8.4*   LFT Recent Labs    02/21/22 0440  PROT 4.4*  ALBUMIN 2.5*  AST 31  ALT 24  ALKPHOS 38  BILITOT 4.9*   PT/INR Recent Labs    02/21/22 1350 02/22/22 0530  LABPROT 19.6* 18.5*  INR 1.7* 1.6*   DG Chest Port 1 View  Result Date: 02/21/2022 CLINICAL DATA:  Ventilator dependent respiratory failure. EXAM: PORTABLE CHEST 1 VIEW COMPARISON:  Portable chest yesterday at 10:23 p.m. FINDINGS: 4:59 a.m. ETT tip is 3.1 cm from the carina, left IJ central line tip  in the distal SVC. There are multiple overlying monitor wires. There are low lung volumes. Mild elevation right hemidiaphragm limiting visualization of the right base. There is perihilar and right basilar linear atelectasis without focal consolidation the visualized lungs. The cardiomediastinal silhouette and vasculature are normal. There is thoracic spondylosis. IMPRESSION: Limited exam with low lung volumes and elevated right hemidiaphragm. Assessment of the bases is limited. The visualized lungs without focal infiltrates. Support apparatus as above. Electronically Signed   By: Telford Nab M.D.   On: 02/21/2022 06:18   DG CHEST PORT 1 VIEW  Result Date: 02/20/2022 CLINICAL DATA:  Central line placement EXAM: PORTABLE CHEST 1 VIEW COMPARISON:  02/20/2022 FINDINGS: Left central line is been placed with the tip in the SVC. No pneumothorax. Endotracheal tube is 5 cm above the carina. Heart normal size. No confluent opacities or effusions. IMPRESSION: Support devices in expected position as above. No acute cardiopulmonary disease. Electronically Signed   By: Rolm Baptise M.D.   On: 02/20/2022 22:30    Assessment / Plan: Upper GI bleed with hematemesis. EGD 02/21/2022: Dr. Fuller Plan placed multiple bands to actively bleeding grade 3 esophageal varices.  Noted large volume of blood and clots in the gastric cardia, fundus, body which obscured visualization of the cardia and fundus.  Duodenal bulb, D2 normal. Day 3 octreotide.  Day 3 Protonix gtt.  Day 3 Rocephin. Does not seem to be actively bleeding as Hgb is holding, blood pressure holding off of pressors, and he is not tachycardic.   Blood loss anemia.  Hgb 10.4 grams this AM.  S/P 2 units PRBCs here.  Received 8 units PRBCs, 2 FFP, and 1 unit of platelets at [facility].   Thrombocytopenia.  Platelets 52.  S/p platelets.     Coagulopathy.  INR 1.6... >10. 1.6 today s/p 2 cryoprecipitate.     Cirrhosis of the liver, not attributed to alcohol,  hepatitis.  IgA elevated but o/w normal AIH markers.  Unknown source at this juncture.  No a transplant candidate per [facility].  Outpatient referral to [facility] was in process.     AKI     Ascites.  4 L paracentesis 10/16, no SBP.       Intubated for airway protection  -Should continue PPI and octreotide gtts for 72 hours from the time of procedure on 10/18. -IR has seen the patient.  Needs TIPS if re-bleeds. -Continue NPO for now.    LOS: 2 days   Laban Emperor. Zehr  02/22/2022, 12:57 PM     Attending Physician Note   I have taken an interval history, reviewed the chart and examined the patient. I performed a substantive portion of this encounter, including complete performance of at least one of the key components, in conjunction with the APP. I agree with the APP's note, impression and recommendations with my edits. My additional impressions and recommendations are as follows.   Esophageal variceal bleed, resolving. Known gastric varices. Still passing blood from major bleed however active bleeding appears to have resolved. Continue to monitor closely for re-bleed. Continue octreotide gtt and pantoprazole gtt for 72 hours. If he re-bleeds in hospital proceed to TIPS. If he remains stable repeat banding as outpatient with Dr. Abbey Chatters in about 1 month.   ABL anemia. Continue to trend.   Decompensated cirrhosis with ascites, varices, portal hypertension, coagulopathy, thrombocytopenia.   Lucio Edward, MD [facility] See AMION, [facility], for our on call provider

## 2022-02-22 NOTE — Procedures (Signed)
Extubation Procedure Note  Patient Details:   Name: Edward [name] [facility] DOB: 25-Jun-1950 MRN: [id number]   Airway Documentation:    Vent end date: 02/22/22 Vent end time: 1554   Evaluation  O2 sats: stable throughout Complications: No apparent complications Patient did tolerate procedure well. Bilateral Breath Sounds: Rhonchi   Yes  [name] 02/22/2022, 10:55 AM

## 2022-02-22 NOTE — Telephone Encounter (Signed)
FYI

## 2022-02-22 NOTE — Progress Notes (Signed)
NAME:  Edward [name], MRN:  [id number], DOB:  [birth year], LOS: 2 ADMISSION DATE:  02/20/2022, CONSULTATION DATE:  02/20/22 REFERRING MD:  Karle Starch CHIEF COMPLAINT:  GIB   History of Present Illness:  Edward [name] is a 71 y.o. male who has a PMH as below including but not limited to cirrhosis with ascites undergoing serial paracenteses, portal hypertension with paraesophageal and perigastric varices (s/p esophageal banding 02/01/22 after surveillance EGD), concern for Wilson's disease.  He was apparently evaluated at [facility] for transplant but was deemed to not be a candidate. He presented [facility]16 with cramping of the hands and feet.  He had had a paracentesis earlier that day with 4 L removed (see below).  While in ED, he began to spit up bright red blood.  This progressed to frank hematemesis, large-volume and persistent.  He was started on Protonix, octreotide, ceftriaxone.  He then became hypotensive; therefore, massive transfusion protocol was initiated.  He received 8 units PRBC, 2 units FFP, 1 unit platelets.  He was intubated for airway protection was also started on Levophed.  Of note, he had an EGD 9/28 that showed no active bleeding but did show esophageal and gastric varices with banding of 3 esophageal varices.  Due to large-volume hematemesis and hemodynamic instability, he was transferred to [facility] for further evaluation by intensivist and GI services.  GI was consulted and agreed to scope him, this is still pending.  His repeat hemoglobin following transfusion is 12.9 (drawn around 0630).  He is currently on 5 of Levophed and has stable vital signs.  He does have an OG tube in place and has ongoing bleeding with bright red blood.  OG tube on low intermittent suction.  Wife denies any antiplatelet, NSAID, anticoagulation.  He was previously on aspirin; however, this was discontinued by cardiology due to progressive liver disease. He does not drink alcohol anymore, quit some 30 year  ago but per wife, was not heavy drinker prior to that.  He had recent admission 02/13/2022 through 02/14/2022 for dyspnea and abdominal distention secondary to ascites.  He underwent paracentesis/11 with 3.4 L removed.  Fluid was negative for SBP.  He was discharged with instructions for repeat paracentesis as an outpatient following discharge.  Per notes, it appears that he underwent repeat paracentesis on 10/16 with 4 L removed.  Pertinent  Medical History:  has Mixed hyperlipidemia; Coronary atherosclerosis of native coronary artery; CKD (chronic kidney disease) stage 3B, GFR 30-59 ml/min ([place]); Insomnia; Essential hypertension; Gastroesophageal reflux disease without esophagitis; Type 2 diabetes with nephropathy ([place]); Abnormal liver function tests; Gallstones with acute cholecystitis; Acute Calculous cholecystitis with Elevated LFTs; Thrombocytopenia ([place]); Iron deficiency anemia; Non-traumatic rhabdomyolysis; Decompensated hepatic cirrhosis ([place]); Shortness of breath; Other ascites; Abnormal CT scan, esophagus; Chronic cholecystitis; Pneumonia; Hypoglycemia; Anasarca; Hypoalbuminemia; AKI (acute kidney injury) ([place]); Massive volume overload; Hypokalemia; Secondary esophageal varices without bleeding ([place]); Fever; Sepsis secondary to UTI ([place]); Cirrhosis of liver with ascites ([place]); Anemia of chronic disease; GI bleed; Bleeding esophageal varices ([place]); Hematemesis; Hemorrhagic shock ([place]); Respiratory insufficiency; Pressure injury of skin; and Protein-calorie malnutrition, severe on their problem list.  Significant Hospital Events: Including procedures, antibiotic start and stop dates in addition to other pertinent events   10/17 MTP at [facility], admit, EGD   Interim History / Subjective:  No acute events overnight. Rectal tube removed.  Afebrile overnight  Objective:  Blood pressure (!) 105/56, pulse 89, temperature 98.4 F (36.9 C), resp. rate 16, height '5\' 10"'$  (1.778  m), weight 73.1 kg, SpO2  100 %. CVP:  [1 mmHg-39 mmHg] 39 mmHg  Vent Mode: PRVC FiO2 (%):  [40 %] 40 % Set Rate:  [16 bmp] 16 bmp Vt Set:  [500 mL-580 mL] 500 mL PEEP:  [5 cmH20] 5 cmH20 Plateau Pressure:  [12 cmH20-17 cmH20] 15 cmH20   Intake/Output Summary (Last 24 hours) at 02/22/2022 0832 Last data filed at 02/22/2022 0600 Gross per 24 hour  Intake 2113.14 ml  Output 605 ml  Net 1508.14 ml    Filed Weights   02/19/22 2158 02/22/22 0409  Weight: 64.4 kg 73.1 kg   UOP 505cc Net +10L this admission  Examination: General: critically ill appearing man lying in bed in NAD, examined as propofol was turned off Neuro: RASS -5, small pupils, cough with tracheal suctioning HEENT: [place]/AT, eyes anicteric Cardiovascular: S1S2, RRRR Lungs: synchronous with MV, almost apneic on SBT. CTAB, minimal ETT secretions. Abdomen: soft, NT Musculoskeletal: minimal muscle mass Skin: warm, dry, spider angiomas, bruising on arms   INR 1.6 WBC 7.5 H/H 10.4/29.6 Platelets 52 BUN 54 Cr 2.94 Peritoneal fluid> NGTD  Resolved problem list:    Assessment & Plan:   Acute upper GI bleed 2/2 esophageal varices.  Of note, he is s/p EGD 9/28 with banding of 3 esophageal varices (non-bleeding). S/p 6 bands on 10/18.  MELD-Na= 30 (a/w 27-32% mortality in the next 90 days) Cirrhosis with ascites (unclear etiology), concern for Wilsons disease (low ceruloplasmin and elevated 24 hour urine copper - appreciate GI's management; needs TIPS if he rebleeds - monitor for ongoing bleeding -CBC q8h, daily INR -con't PPI, octreotide drips -ceftriaxone 1g SBP prophylaxis  Acute blood loss anemia and hemorrhagic shock - 2/2 above. Thrombocytopenia- acute on chronic - off pressors -CBC q8h -can resume PTA midodrine when able to take PO -if he rebleeds needs matched transfusions with coagulopathy and thrombocytopenia  Acute metabolic encephalopathy -check ammonia -hold sedation -empiric rectal lactulose since no enteral  access  Consumptive coagulopathy -daily INR -vit K x 3 days  Hypocalcemia, resolved based on corrected calcium -monitor  Acute respiratory failure with hypoxia - LTVV -VAP prevention protocol -Pad protocol- remain off sedation -daily SAT & SBT; extubate as soon as able  AKI on CKD 3b - likely Hepatorenal syndrome hyperphosphatemia -strict I/O -renally dose meds, avoid nephrotoxic meds -maintain adequate renal perfusion -con't foley  Hx CAD, MI HTN, HLD -hold spiro, lasix, NGT  Hx DM, uncontrolled hyperglycemia -hold PTA linagliptin -con't semglee 10 units -SSI PRN -goal BG 140-180   Severe protein energy malnutrition -if not getting extubated today and not getting NGT, he needs TPN  GOC -Ongoing GOC discussions with family, appreciate PMT's input. Family hopeful for full recovery.  Best practice (evaluated daily):  Diet/type: NPO DVT prophylaxis: not indicated GI prophylaxis: PPI Lines: Central line Foley:  Yes, and it is still needed Code Status:  full code Last date of multidisciplinary goals of care discussion:    Labs   CBC: Recent Labs  Lab 02/21/22 0440 02/21/22 1350 02/21/22 1820 02/22/22 0114 02/22/22 0530  WBC 10.3 10.8* 10.0 8.5 7.5  NEUTROABS 8.3*  --   --   --   --   HGB 10.9* 9.9* 10.0* 9.8* 10.4*  HCT 29.4* 27.3* 28.0* 27.8* 29.6*  MCV 84.7 85.6 87.0 86.6 86.5  PLT 68* 72* 66* 60* 52*     Basic Metabolic Panel: Recent Labs  Lab 02/20/22 0142 02/20/22 0638 02/21/22 0440 02/22/22 0530  NA 133* 131* 135 136  K 4.8 3.8 4.9 4.3  CL 93*  --  99 101  CO2 29  --  22 21*  GLUCOSE 198*  --  243* 193*  BUN 26*  --  35* 54*  CREATININE 2.30*  --  2.78* 2.97*  CALCIUM 9.1  --  8.4* 8.4*  MG  --   --  1.7 2.2  PHOS  --   --  5.8* 5.5*    GFR: Estimated Creatinine Clearance: 24.2 mL/min (A) (by C-G formula based on SCr of 2.97 mg/dL (H)). Recent Labs  Lab 02/21/22 1350 02/21/22 1820 02/22/22 0114 02/22/22 0530  WBC 10.8* 10.0  8.5 7.5     Liver Function Tests: Recent Labs  Lab 02/20/22 0142 02/21/22 0440  AST 57* 31  ALT 49* 24  ALKPHOS 81 38  BILITOT 3.1* 4.9*  PROT 6.7 4.4*  ALBUMIN 3.5 2.5*     This patient is critically ill with multiple organ system failure which requires frequent high complexity decision making, assessment, support, evaluation, and titration of therapies. This was completed through the application of advanced monitoring technologies and extensive interpretation of multiple databases. During this encounter critical care time was devoted to patient care services described in this note for 36 minutes.  Julian Hy, DO 02/22/22 9:24 AM [facility]

## 2022-02-22 NOTE — Progress Notes (Signed)
Daily Progress Note   Patient Name: Edward [name] [facility]       Date: 02/22/2022 DOB: [birth year]  Age: 71 y.o. MRN#: [id number] Attending Physician: Julian Hy, DO Primary Care Physician: [name], FNP Admit Date: 02/20/2022  Reason for Consultation/Follow-up: Establishing goals of care  Subjective: Remains on vent  Length of Stay: 2  Current Medications: Scheduled Meds:   Chlorhexidine Gluconate Cloth  6 each Topical Q2000   insulin aspart  0-9 Units Subcutaneous Q4H   insulin glargine-yfgn  10 Units Subcutaneous Daily   lactulose  300 mL Rectal TID   mupirocin ointment  1 Application Nasal BID   [START ON 02/23/2022] pantoprazole  40 mg Intravenous Q12H   sodium chloride flush  10-40 mL Intracatheter Q12H   terbinafine   Topical BID    Continuous Infusions:  sodium chloride 10 mL/hr at 02/22/22 0600   cefTRIAXone (ROCEPHIN)  IV Stopped (02/22/22 0000)   octreotide (SANDOSTATIN) 500 mcg in sodium chloride 0.9 % 250 mL (2 mcg/mL) infusion 50 mcg/hr (02/22/22 0846)   pantoprazole 8 mg/hr (02/22/22 0737)   phytonadione (VITAMIN K) 5 mg in dextrose 5 % 50 mL IVPB 5 mg (02/22/22 1210)    PRN Meds: sodium chloride, sodium chloride flush  Physical Exam Constitutional:      General: He is not in acute distress.    Appearance: He is ill-appearing.     Comments: Reamins on vent, opens eyes and follows some commands  Skin:    General: Skin is warm and dry.             Vital Signs: BP (!) 105/56 (BP Location: Right Arm)   Pulse 89   Temp 98.4 F (36.9 C)   Resp 16   Ht '5\' 10"'$  (1.778 m)   Wt 73.1 kg   SpO2 100%   BMI 23.12 kg/m  SpO2: SpO2: 100 % O2 Device: O2 Device: Nasal Cannula O2 Flow Rate: O2 Flow Rate (L/min): 4 L/min  Intake/output summary:  Intake/Output  Summary (Last 24 hours) at 02/22/2022 1224 Last data filed at 02/22/2022 0600 Gross per 24 hour  Intake 1782.79 ml  Output 495 ml  Net 1287.79 ml   LBM: Last BM Date : 02/21/22 Baseline Weight: Weight: 64.4 kg Most recent weight: Weight: 73.1 kg        Patient Active Problem List   Diagnosis Date Noted   Protein-calorie malnutrition, severe 02/21/2022   GI bleed 02/20/2022   Pressure injury of skin 02/20/2022   Bleeding esophageal varices (HCC)    Hematemesis    Hemorrhagic shock (HCC)    Respiratory insufficiency    Anemia of chronic disease 02/14/2022   Cirrhosis of liver with ascites ([place]) 02/13/2022   Sepsis secondary to UTI ([place]) 02/08/2022   Fever 02/04/2022   Secondary esophageal varices without bleeding (HCC)    Hypokalemia 12/09/2021   Massive volume overload 12/08/2021   AKI (acute kidney injury) ([place])    Anasarca    Hypoalbuminemia    Pneumonia 11/27/2021   Hypoglycemia 11/27/2021   Chronic cholecystitis    Shortness of breath    Other ascites    Abnormal CT scan, esophagus    Non-traumatic rhabdomyolysis  Decompensated hepatic cirrhosis (HCC)    Abnormal liver function tests 11/18/2021   Gallstones with acute cholecystitis 11/18/2021   Acute Calculous cholecystitis with Elevated LFTs 11/18/2021   Thrombocytopenia ([place]) 11/18/2021   Iron deficiency anemia 11/18/2021   Type 2 diabetes with nephropathy ([place]) 02/20/2021   Gastroesophageal reflux disease without esophagitis 08/16/2020   Essential hypertension 08/05/2015   Insomnia 03/26/2014   CKD (chronic kidney disease) stage 3B, GFR 30-59 ml/min (HCC) 03/08/2014   Coronary atherosclerosis of native coronary artery 10/03/2010   Mixed hyperlipidemia 11/24/2008    Palliative Care Assessment & Plan   HPI: 71 y.o. male  with past medical history of cirrhosis with ascites undergoing serial paracenteses, portal hypertension with paraesophageal and perigastric varices (s/p esophageal banding 02/01/22 after  surveillance EGD), concern for Wilson's disease, CAD, DM, HTN, and GERD admitted on 02/20/2022 with cramping in hands and feet.  While in the ED he developed frank hematemesis.  Patient became hypotensive and unresponsive and ultimately required intubation.  GI bleed due to esophageal varices -he had 6 bands during EGD 10/18.  Patient also with AKI, likely hepatorenal syndrome.  Patient remains on ventilator and low-dose vasopressors.  Patient with frequent hospital admissions recently.  PMT consulted to discuss goals of care with family.  Assessment: Assessed patient - remains on vent during my assessment but more awake and following some commands.  Received update from Dr. Carlis Abbott - hopeful for extubation later today.  Call to patient's wife Georgeann Oppenheim. We reviewed patient's current condition. She is hopeful for ongoing improvement of extubation today. She remains hopeful for improvement to baseline. We review our goals of care conversation from yesterday - goals remain the same with no further questions or concerns.  We reviewed PMT can attempt to have further [place] discussions with patient once mental status allows.   Recommendations/Plan: - PMT will for patient's ability to participate in [place] discussions  To remain full code at this time - family a little uncertain about this but not ready to change code status and hopeful that he can wake up and better participate in goals of care conversations  Family remains interested in medical interventions offered to prolong life - would be open to further discussion about TIPS if warranted  Family expresses patient would not want long term life support/trach  Goals of Care and Additional Recommendations: Limitations on Scope of Treatment: No Tracheostomy  Code Status: Full code  Care plan was discussed with patient's family  Thank you for allowing the Palliative Medicine Team to assist in the care of this patient.  *Please note that this is a  verbal dictation therefore any spelling or grammatical errors are due to the "[place]" system interpretation.  Juel Burrow, DNP, [facility] Palliative Medicine Team Team Phone # [phone]  Pager [phone]

## 2022-02-22 NOTE — Progress Notes (Signed)
Hb stable, platelets dropping <50. Will give 1 unit platelets now, con't periodic monitoring.  Julian Hy, DO 02/22/22 6:06 PM [facility]

## 2022-02-23 ENCOUNTER — Inpatient Hospital Stay (HOSPITAL_COMMUNITY): Admission: RE | Admit: 2022-02-23 | Payer: 59 | Source: Ambulatory Visit

## 2022-02-23 DIAGNOSIS — I8501 Esophageal varices with bleeding: Secondary | ICD-10-CM | POA: Diagnosis not present

## 2022-02-23 DIAGNOSIS — R578 Other shock: Secondary | ICD-10-CM | POA: Diagnosis not present

## 2022-02-23 DIAGNOSIS — Z515 Encounter for palliative care: Secondary | ICD-10-CM | POA: Diagnosis not present

## 2022-02-23 DIAGNOSIS — E43 Unspecified severe protein-calorie malnutrition: Secondary | ICD-10-CM | POA: Diagnosis not present

## 2022-02-23 DIAGNOSIS — K920 Hematemesis: Secondary | ICD-10-CM | POA: Diagnosis not present

## 2022-02-23 LAB — CBC
HCT: 28.8 % — ABNORMAL LOW (ref 39.0–52.0)
Hemoglobin: 10.1 g/dL — ABNORMAL LOW (ref 13.0–17.0)
MCH: 32 pg (ref 26.0–34.0)
MCHC: 35.1 g/dL (ref 30.0–36.0)
MCV: 91.1 fL (ref 80.0–100.0)
Platelets: 48 10*3/uL — ABNORMAL LOW (ref 150–400)
RBC: 3.16 MIL/uL — ABNORMAL LOW (ref 4.22–5.81)
RDW: 16.1 % — ABNORMAL HIGH (ref 11.5–15.5)
WBC: 5.6 10*3/uL (ref 4.0–10.5)
nRBC: 0 % (ref 0.0–0.2)

## 2022-02-23 LAB — BASIC METABOLIC PANEL
Anion gap: 8 (ref 5–15)
BUN: 77 mg/dL — ABNORMAL HIGH (ref 8–23)
CO2: 25 mmol/L (ref 22–32)
Calcium: 8.5 mg/dL — ABNORMAL LOW (ref 8.9–10.3)
Chloride: 108 mmol/L (ref 98–111)
Creatinine, Ser: 3.1 mg/dL — ABNORMAL HIGH (ref 0.61–1.24)
GFR, Estimated: 21 mL/min — ABNORMAL LOW (ref >60)
Glucose, Bld: 176 mg/dL — ABNORMAL HIGH (ref 70–99)
Potassium: 4.3 mmol/L (ref 3.5–5.1)
Sodium: 141 mmol/L (ref 135–145)

## 2022-02-23 LAB — PREPARE PLATELET PHERESIS: Unit division: 0

## 2022-02-23 LAB — PHOSPHORUS: Phosphorus: 6 mg/dL — ABNORMAL HIGH (ref 2.5–4.6)

## 2022-02-23 LAB — GLUCOSE, CAPILLARY
Glucose-Capillary: 160 mg/dL — ABNORMAL HIGH (ref 70–99)
Glucose-Capillary: 174 mg/dL — ABNORMAL HIGH (ref 70–99)
Glucose-Capillary: 203 mg/dL — ABNORMAL HIGH (ref 70–99)
Glucose-Capillary: 242 mg/dL — ABNORMAL HIGH (ref 70–99)
Glucose-Capillary: 161 mg/dL — ABNORMAL HIGH (ref 70–99)
Glucose-Capillary: 103 mg/dL — ABNORMAL HIGH (ref 70–99)

## 2022-02-23 LAB — BPAM PLATELET PHERESIS
Blood Product Expiration Date: 202310212359
ISSUE DATE / TIME: 202310191944
Unit Type and Rh: 6200

## 2022-02-23 LAB — PROTIME-INR
INR: 1.5 — ABNORMAL HIGH (ref 0.8–1.2)
Prothrombin Time: 17.9 seconds — ABNORMAL HIGH (ref 11.4–15.2)

## 2022-02-23 LAB — MAGNESIUM: Magnesium: 2.1 mg/dL (ref 1.7–2.4)

## 2022-02-23 MED ORDER — ALBUMIN HUMAN 25 % IV SOLN
25.0000 g | Freq: Three times a day (TID) | INTRAVENOUS | Status: AC
  Administered 2022-02-23 (×3): 25 g via INTRAVENOUS
  Filled 2022-02-23 (×3): qty 100

## 2022-02-23 MED ORDER — RIFAXIMIN 200 MG PO TABS
200.0000 mg | ORAL_TABLET | Freq: Three times a day (TID) | ORAL | Status: DC
  Administered 2022-02-23 – 2022-03-01 (×18): 200 mg via ORAL
  Filled 2022-02-23 (×21): qty 1

## 2022-02-23 MED ORDER — HALOPERIDOL LACTATE 5 MG/ML IJ SOLN
2.0000 mg | Freq: Once | INTRAMUSCULAR | Status: AC
  Administered 2022-02-23: 2 mg via INTRAVENOUS
  Filled 2022-02-23: qty 1

## 2022-02-23 MED ORDER — LORAZEPAM 2 MG/ML IJ SOLN: 1.0000 mg | Freq: Once | INTRAMUSCULAR | Status: DC | PRN

## 2022-02-23 MED ORDER — LACTULOSE 10 GM/15ML PO SOLN
20.0000 g | Freq: Three times a day (TID) | ORAL | Status: DC
  Administered 2022-02-23 – 2022-02-27 (×12): 20 g via ORAL
  Filled 2022-02-23 (×12): qty 30

## 2022-02-23 NOTE — Evaluation (Addendum)
Clinical/Bedside Swallow Evaluation Patient Details  Name: Edward [name] MRN: [id number] Date of Birth: Jun 04, 1950  Today's Date: 02/23/2022 Time: SLP Start Time (ACUTE ONLY): 1110 SLP Stop Time (ACUTE ONLY): 1124 SLP Time Calculation (min) (ACUTE ONLY): 14 min  Past Medical History:  Past Medical History:  Diagnosis Date   Cataract    CKD (chronic kidney disease) stage 3, GFR 30-59 ml/min (HCC)    Coronary atherosclerosis of native coronary artery    BMS LAD 2006; residual 75% distal RCA; EF 50%   Essential hypertension    Glaucoma    Low back pain    MI (myocardial infarction) ([place])    Anterior 2006   Mixed hyperlipidemia    Renal insufficiency    Type 2 diabetes mellitus ([place])    Past Surgical History:  Past Surgical History:  Procedure Laterality Date   CATARACT EXTRACTION, BILATERAL     CORONARY ANGIOPLASTY WITH STENT PLACEMENT  2006   ESOPHAGEAL BANDING  02/01/2022   Procedure: ESOPHAGEAL BANDING;  Surgeon: Eloise Harman, DO;  [facility];  Service: Endoscopy;;   ESOPHAGOGASTRODUODENOSCOPY (EGD) WITH PROPOFOL N/A 02/01/2022   Procedure: ESOPHAGOGASTRODUODENOSCOPY (EGD) WITH PROPOFOL;  Surgeon: Eloise Harman, DO;  Location: [facility];  Service: Endoscopy;  Laterality: N/A;  12:30 am   HPI:  Edward [name] is a 71 y.o. male who presented to [facility] 10[facility]16 with cramping of the hands and feet.  He had had a paracentesis earlier that day with 4 L removed (see below).  While in ED, he began to spit up bright red blood.  This progressed to frank hematemesis, large-volume and persistent.  He was started on Protonix, octreotide, ceftriaxone.  He then became hypotensive; therefore, massive transfusion protocol was initiated.  He received 8 units PRBC, 2 units FFP, 1 unit platelets.  He was intubated for airway protection was also started on Levophed. Pt with EGD 10/18 with banding x6. CXR 10/18 without focal infiltrate. Pt has a PMH including but not limited to  cirrhosis with ascites undergoing serial paracenteses, portal hypertension with paraesophageal and perigastric varices (s/p esophageal banding 02/01/22 after surveillance EGD), concern for Wilson's disease.  He was apparently evaluated at [facility] for transplant but was deemed to not be a candidate.    Assessment / Plan / Recommendation  Clinical Impression  Pt presents with clinical indicators of pharyngeal dysphagia.  SLP provided oral care prior to adminstration of PO trials.  Pt with dried, dark red secretions in oral cavity.  Pt required multiple swallows per bolus with all consistencies trialed. With thin liquid there was intermittent wet cough.  There was no coughing noted with serial sips of nectar thick liquid.  Pt may initiated clear liquid diet per GI and may have trials of advanced POs for assessment only.  Recommend instrumental assessment prior to initiating PO diet.  Pt declines FEES, but radiology schedule unable to accommodate MBSS.  Discussed with team and pt is unable to have cortrak placed 2/2 varices.  Will proceed with FEES to assess pharyngeal swallow function.  Recommend pt remain NPO with alternate means of nutrition, hydration, and medication.  Pt may have ice chips for comfort. in moderation, after good oral care, when fully awake/alert, with upright positioning and supervision.  SLP Visit Diagnosis: Dysphagia, unspecified (R13.10)    Aspiration Risk  Moderate aspiration risk    Diet Recommendation NPO   Medication Administration: Via alternative means    Other  Recommendations Oral Care Recommendations: Oral care prior  to ice chip/H20    Recommendations for follow up therapy are one component of a multi-disciplinary discharge planning process, led by the attending physician.  Recommendations may be updated based on patient status, additional functional criteria and insurance authorization.  Follow up Recommendations  (pending results of MBS)      Assistance Recommended  at Discharge  (pending results of MBS)  Functional Status Assessment  (pending results of MBS)  Frequency and Duration     (pending results of MBS)       Prognosis Prognosis for Safe Diet Advancement:  (pending results of MBS)      Swallow Study   General HPI: Edward [name] is a 71 y.o. male who presented to [place] 10/16 with cramping of the hands and feet.  He had had a paracentesis earlier that day with 4 L removed (see below).  While in ED, he began to spit up bright red blood.  This progressed to frank hematemesis, large-volume and persistent.  He was started on Protonix, octreotide, ceftriaxone.  He then became hypotensive; therefore, massive transfusion protocol was initiated.  He received 8 units PRBC, 2 units FFP, 1 unit platelets.  He was intubated for airway protection was also started on Levophed. Pt with EGD 10/18 with banding x6. CXR 10/18 without focal infiltrate. 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  He was apparently evaluated at [facility] for transplant but was deemed to not be a candidate. Type of Study: Bedside Swallow Evaluation Previous Swallow Assessment: None Diet Prior to this Study: NPO Temperature Spikes Noted: No Respiratory Status: Nasal cannula History of Recent Intubation: Yes Length of Intubations (days): 3 days Date extubated: 02/22/22 Behavior/Cognition: Alert;Confused Oral Cavity Assessment: Dried secretions Oral Care Completed by SLP: Yes Oral Cavity - Dentition: Missing dentition Patient Positioning: Upright in bed Baseline Vocal Quality: Normal Volitional Cough: Weak Volitional Swallow: Unable to elicit    Oral/Motor/Sensory Function Overall Oral Motor/Sensory Function: Within functional limits Facial ROM: Within Functional Limits Facial Symmetry:  Within Functional Limits Lingual ROM: Within Functional Limits Lingual Symmetry: Within Functional Limits Lingual Strength: Within Functional Limits Velum: Within Functional Limits Mandible: Within Functional Limits   Ice Chips Ice chips: Not tested   Thin Liquid Thin Liquid: Impaired Presentation: Cup;Straw Pharyngeal  Phase Impairments: Multiple swallows;Cough - Immediate;Wet Vocal Quality    Nectar Thick Nectar Thick Liquid: Impaired Pharyngeal Phase Impairments: Multiple swallows   Honey Thick     Puree     Solid            Celedonio Savage, MA, [place] Office: [phone] 02/23/2022,11:38 AM

## 2022-02-23 NOTE — Progress Notes (Addendum)
  Transition of Care [facility]) Screening Note   Patient Details  Name: Edward [name] [name] Date of Birth: 06-Aug-1950   Transition of Care [facility]) CM/SW Contact:    Tom-Johnson, Renea Ee, RN Phone Number: 02/23/2022, 12:26 PM  CM went to speak with patient at bedside about discharge disposition. Patient states "I'm not answering any questions, I live with my ex-wife and that's it". CM asked patient if she could speak with his ex-wife and patient declined. Nurse Tech was at bedside. CM will followup with patient when appropriate.   Transition of Care Department [facility]) has reviewed patient and no TOC needs or recommendations have been identified at this time. TOC will continue to monitor patient advancement through interdisciplinary progression rounds. If new patient transition needs arise, please place a TOC consult.

## 2022-02-23 NOTE — Progress Notes (Addendum)
[facility] Progress Note  CC:  Variceal bleed   Subjective:  Patient has been screaming and pushing his call bell to have something to drink.  Stool in flexiseal bag is now dark/black, no longer red/maroon.  Objective:  Vital signs in last 24 hours: Temp:  [97.8 F (36.6 C)-99.5 F (37.5 C)] 97.8 F (36.6 C) (10/20 0806) Pulse Rate:  [99-123] 105 (10/20 0500) Resp:  [12-28] 19 (10/20 0500) BP: (83-129)/(63-81) 121/75 (10/20 0639) SpO2:  [94 %-100 %] 94 % (10/20 0500) Arterial Line BP: (135-177)/(45-75) 177/64 (10/20 0400) Weight:  [71.3 kg] 71.3 kg (10/20 0500) Last BM Date : 02/22/22 General:  Alert, chronically ill-appearing, in NAD Heart:  Slightly tachy; no murmurs Pulm:  CTAB.  No W/R/R. Abdomen:  Soft, distended.  BS present.  Non-tender. Extremities:  Without edema.  Intake/Output from previous day: 10/19 0701 - 10/20 0700 In: 2646.6 [I.V.:927.8; Blood:568.7; IV Piggyback:150.1] Out: 3300 [Urine:850; EVOJJ:0093]  Lab Results: Recent Labs    02/22/22 1302 02/22/22 2050 02/23/22 0418  WBC 6.3 5.7 5.6  HGB 10.8* 10.3* 10.1*  HCT 30.3* 29.3* 28.8*  PLT 42* 41* 48*   BMET Recent Labs    02/21/22 0440 02/22/22 0530 02/23/22 0418  NA 135 136 141  K 4.9 4.3 4.3  CL 99 101 108  CO2 22 21* 25  GLUCOSE 243* 193* 176*  BUN 35* 54* 77*  CREATININE 2.78* 2.97* 3.10*  CALCIUM 8.4* 8.4* 8.5*   LFT Recent Labs    02/21/22 0440  PROT 4.4*  ALBUMIN 2.5*  AST 31  ALT 24  ALKPHOS 38  BILITOT 4.9*   PT/INR Recent Labs    02/22/22 0530 02/23/22 0418  LABPROT 18.5* 17.9*  INR 1.6* 1.5*   Assessment / Plan: Upper GI bleed with hematemesis. EGD 02/21/2022: Dr. Fuller Plan placed multiple bands to actively bleeding grade 3 esophageal varices.  Noted large volume of blood and clots in the gastric cardia, fundus, body which obscured visualization of the cardia and fundus.  Duodenal bulb, D2 normal. Day 4 Rocephin. Does not seem to be bleeding  as Hgb is holding, blood pressure holding off of pressors, and his stool is now looking dark like old blood in his flexiseal.   Blood loss anemia.  Hgb 10.1 grams this AM.  S/P 2 units PRBCs here.  Received 8 units PRBCs, 2 FFP, and 1 unit of platelets at [facility].   Thrombocytopenia.  Platelets 52.  S/P platelets.     Coagulopathy.  INR 1.6... >10. 1.5 today s/p 2 cryoprecipitate.     Cirrhosis of the liver, not attributed to alcohol, hepatitis.  IgA elevated but o/w normal AIH markers.  Unknown source at this juncture.  No a transplant candidate per [facility].  Outpatient referral to [facility] was in process.     AKI     Ascites.  4 L paracentesis 10/16, no SBP.       Intubated for airway protection   -PPI and octreotide gtts ran out this AM.  Pantoprazole 40 mg IV BID. -IR has seen the patient.  Needs TIPS if re-bleeds. -Can have clear liquids but patient started choking on a sip so speech was just getting there to see him when I was leaving.   -If he remains stable repeat banding as outpatient with Dr. Abbey Chatters in about 1 month.     LOS: 3 days   Laban Emperor. Zehr  02/23/2022, 9:55 AM   Attending Physician Note   I have  taken an interval history, reviewed the chart and examined the patient. I performed a substantive portion of this encounter, including complete performance of at least one of the key components, in conjunction with the APP. I agree with the APP's note, impression and recommendations with my edits. My additional impressions and recommendations are as follows.   Resolved major variceal bleed, S/P variceal banding. Likely passing old blood. Hgb and VS are stable. He is off pressors. Octreotide gtt completed. Continue pantoprazole 40 mg bid for now. Outpatient GI follow up with Dr. Abbey Chatters for repeat banding and mgmt of decompensated cirrhosis.  GI signing off.   Lucio Edward, MD [facility] See [facility], [facility], for our on call provider

## 2022-02-23 NOTE — Progress Notes (Signed)
NAME:  Edward [name], MRN:  [id number], DOB:  03-31-1951, LOS: 3 ADMISSION DATE:  02/20/2022, CONSULTATION DATE:  02/20/22 REFERRING MD:  Karle Starch CHIEF COMPLAINT:  GIB   History of Present Illness:  Edward [name] is a 71 y.o. male who has a PMH as below including but not limited to cirrhosis with ascites undergoing serial paracenteses, portal hypertension with paraesophageal and perigastric varices (s/p esophageal banding 02/01/22 after surveillance EGD), concern for Wilson's disease.  He was apparently evaluated at [facility] for transplant but was deemed to not be a candidate. He presented to [facility]16 with cramping of the hands and feet.  He had had a paracentesis earlier that day with 4 L removed (see below).  While in ED, he began to spit up bright red blood.  This progressed to frank hematemesis, large-volume and persistent.  He was started on Protonix, octreotide, ceftriaxone.  He then became hypotensive; therefore, massive transfusion protocol was initiated.  He received 8 units PRBC, 2 units FFP, 1 unit platelets.  He was intubated for airway protection was also started on Levophed.  Of note, he had an EGD 9/28 that showed no active bleeding but did show esophageal and gastric varices with banding of 3 esophageal varices.  Due to large-volume hematemesis and hemodynamic instability, he was transferred to [facility] for further evaluation by intensivist and GI services.  GI was consulted and agreed to scope him, this is still pending.  His repeat hemoglobin following transfusion is 12.9 (drawn around 0630).  He is currently on 5 of Levophed and has stable vital signs.  He does have an OG tube in place and has ongoing bleeding with bright red blood.  OG tube on low intermittent suction.  Wife denies any antiplatelet, NSAID, anticoagulation.  He was previously on aspirin; however, this was discontinued by cardiology due to progressive liver disease. He does not drink alcohol anymore, quit some 30 year  ago but per wife, was not heavy drinker prior to that.  He had recent admission 02/13/2022 through 02/14/2022 for dyspnea and abdominal distention secondary to ascites.  He underwent paracentesis/11 with 3.4 L removed.  Fluid was negative for SBP.  He was discharged with instructions for repeat paracentesis as an outpatient following discharge.  Per notes, it appears that he underwent repeat paracentesis on 10/16 with 4 L removed.  Pertinent  Medical History:  has Mixed hyperlipidemia; Coronary atherosclerosis of native coronary artery; CKD (chronic kidney disease) stage 3B, GFR 30-59 ml/min ([place]); Insomnia; Essential hypertension; Gastroesophageal reflux disease without esophagitis; Type 2 diabetes with nephropathy ([place]); Abnormal liver function tests; Gallstones with acute cholecystitis; Acute Calculous cholecystitis with Elevated LFTs; Thrombocytopenia ([place]); Iron deficiency anemia; Non-traumatic rhabdomyolysis; Decompensated hepatic cirrhosis ([place]); Shortness of breath; Other ascites; Abnormal CT scan, esophagus; Chronic cholecystitis; Pneumonia; Hypoglycemia; Anasarca; Hypoalbuminemia; AKI (acute kidney injury) ([place]); Massive volume overload; Hypokalemia; Secondary esophageal varices without bleeding ([place]); Fever; Sepsis secondary to UTI ([place]); Cirrhosis of liver with ascites ([place]); Anemia of chronic disease; GI bleed; Bleeding esophageal varices ([place]); Hematemesis; Hemorrhagic shock ([place]); Respiratory insufficiency; Pressure injury of skin; Protein-calorie malnutrition, severe; and Acute blood loss anemia on their problem list.  Significant Hospital Events: Including procedures, antibiotic start and stop dates in addition to other pertinent events   10/17 MTP at [facility], admit, EGD 10/19: Extubation  10/20: RA, NPO  Interim History / Subjective:  No acute events overnight. Flexiseal in place   Objective:  Blood pressure 121/75, pulse (!) 105, temperature 97.8 F (36.6 C),  temperature source  Axillary, resp. rate 19, height '5\' 10"'$  (1.778 m), weight 71.3 kg, SpO2 94 %.        Intake/Output Summary (Last 24 hours) at 02/23/2022 0810 Last data filed at 02/23/2022 0600 Gross per 24 hour  Intake 2586.28 ml  Output 3300 ml  Net -713.72 ml    Filed Weights   02/19/22 2158 02/22/22 0409 02/23/22 0500  Weight: 64.4 kg 73.1 kg 71.3 kg     Examination: General: critically ill appearing man lying in bed in NAD, with flexiseal in place  Neuro: Appropriately responsive to questioning  HEENT: [place]/AT, eyes anicteric Cardiovascular: S1S2, RRRR Lungs: CTAB with good aeration  Abdomen: soft, NT, distended  Musculoskeletal: malnourished   CBG 174 BMP  Cr 3.1 Ca 8.5  GFR 21    Resolved problem list:    Assessment & Plan:   Acute upper GI bleed 2/2 esophageal varices.  Of note, he is s/p EGD 9/28 with banding of 3 esophageal varices (non-bleeding). S/p 6 bands on 10/18.  MELD-Na= 30 (a/w 27-32% mortality in the next 90 days) Cirrhosis with ascites (unclear etiology), concern for Wilsons disease (low ceruloplasmin and elevated 24 hour urine copper - appreciate GI's management - monitor for ongoing bleeding -CBC q24, daily INR -con't PPI, octreotide drips d/c after 48 hr  -ceftriaxone 1g SBP prophylaxis x5 days-- started 10/17 may be able to transition to Bactrim PO if discontinued  -If he re-bleeds in hospital proceed to TIPS. If he remains stable repeat banding as outpatient with Dr. Abbey Chatters in about 1 month.   Acute blood loss anemia and hemorrhagic shock - 2/2 above. Thrombocytopenia- acute on chronic -off pressors -CBC q8h -can resume PTA midodrine when able to take PO -if he rebleeds needs matched transfusions with coagulopathy and thrombocytopenia  Acute metabolic encephalopathy -check ammonia>91 today  -empiric rectal lactulose since no enteral access  Consumptive coagulopathy -daily INR -vit K x 3 days  Hypocalcemia, resolved based on corrected calcium>9.7   -monitor  Acute respiratory failure with hypoxia>> RA as of 0400  - Monitor oxygen status   AKI on CKD 3b - likely Hepatorenal syndrome hyperphosphatemia -strict I/O -renally dose meds, avoid nephrotoxic meds -maintain adequate renal perfusion -con't external cath   Hx CAD, MI HTN, HLD -hold spiro, lasix, NGT>>until able to take orally  Hx DM, uncontrolled hyperglycemia -hold PTA linagliptin -con't semglee 10 units -SSI PRN -goal BG 140-180   Severe protein energy malnutrition -Needs oral nutrition, SLP consulted for speech evaluation   GOC -Ongoing GOC discussions with family, appreciate PMT's input. Family hopeful for full recovery.   If able to transition to oral intake and oral medications, will likely be able to transfer to the floor tomorrow 10/21 '@0700'$    Best practice (evaluated daily):  Diet/type: NPO DVT prophylaxis: not indicated GI prophylaxis: PPI Lines: Central line Foley:  Yes, and it is still needed Code Status:  full code Last date of multidisciplinary goals of care discussion:  Will update family today 10/20   Labs   CBC: Recent Labs  Lab 02/21/22 0440 02/21/22 1350 02/22/22 0114 02/22/22 0530 02/22/22 1302 02/22/22 2050 02/23/22 0418  WBC 10.3   < > 8.5 7.5 6.3 5.7 5.6  NEUTROABS 8.3*  --   --   --   --   --   --   HGB 10.9*   < > 9.8* 10.4* 10.8* 10.3* 10.1*  HCT 29.4*   < > 27.8* 29.6* 30.3* 29.3* 28.8*  MCV 84.7   < >  86.6 86.5 88.6 90.2 91.1  PLT 68*   < > 60* 52* 42* 41* 48*   < > = values in this interval not displayed.     Basic Metabolic Panel: Recent Labs  Lab 02/20/22 0142 02/20/22 0638 02/21/22 0440 02/22/22 0530 02/23/22 0418  NA 133* 131* 135 136 141  K 4.8 3.8 4.9 4.3 4.3  CL 93*  --  99 101 108  CO2 29  --  22 21* 25  GLUCOSE 198*  --  243* 193* 176*  BUN 26*  --  35* 54* 77*  CREATININE 2.30*  --  2.78* 2.97* 3.10*  CALCIUM 9.1  --  8.4* 8.4* 8.5*  MG  --   --  1.7 2.2 2.1  PHOS  --   --  5.8* 5.5* 6.0*     GFR: Estimated Creatinine Clearance: 22.7 mL/min (A) (by C-G formula based on SCr of 3.1 mg/dL (H)). Recent Labs  Lab 02/22/22 0530 02/22/22 1302 02/22/22 2050 02/23/22 0418  WBC 7.5 6.3 5.7 5.6     Liver Function Tests: Recent Labs  Lab 02/20/22 0142 02/21/22 0440  AST 57* 31  ALT 49* 24  ALKPHOS 81 38  BILITOT 3.1* 4.9*  PROT 6.7 4.4*  ALBUMIN 3.5 2.5*      Erskine Emery, MD  02/23/22 8:10 AM

## 2022-02-23 NOTE — Progress Notes (Signed)
Palliative Medicine Inpatient Follow Up Note HPI: 71 y.o. male  with past medical history of cirrhosis with ascites undergoing serial paracenteses, portal hypertension with paraesophageal and perigastric varices (s/p esophageal banding 02/01/22 after surveillance EGD), concern for Wilson's disease, CAD, DM, HTN, and GERD admitted on 02/20/2022 with cramping in hands and feet.  While in the ED he developed frank hematemesis.  Patient became hypotensive and unresponsive and ultimately required intubation.  GI bleed due to esophageal varices -he had 6 bands during EGD 10/18.  Patient also with AKI, likely hepatorenal syndrome.  Patient remains on ventilator and low-dose vasopressors.  Patient with frequent hospital admissions recently.  PMT consulted to discuss goals of care with family.  Today's Discussion 02/23/2022  *Please note that this is a verbal dictation therefore any spelling or grammatical errors are due to the "[place] [employer]" system interpretation.  Chart reviewed inclusive of vital signs, progress notes, laboratory results, and diagnostic images.   I have met at bedside with the Suresh this afternoon.  He expresses that he is not interested in speaking with me therefore both his sister and I excused herself from the room.  I was able to meet in the [facility] meeting room with patient's spouse, sister, and son.  A conversation regarding Octavio's complex decline since the month of July was held.  Patient's family is very frustrated with what they believe is poor care that occurred at [facility] inclusive of volume overload.  Patient's family shared that during July's when they identified he had significant cirrhosis.  His son notes that he had been sick for about a year and had trouble in terms of cramping after consumption.  Created space and opportunity for patients family to explore thoughts feelings and fears regarding current medical situation.  They share that Dreux was supposed  to have a liver biopsy which has not been completed.  He was sent to [facility] for evaluation for liver transplant though was deemed to not be a candidate.  Best case and worst-case scenarios were discussed.  Patient's son is vocal that the patient is "a Nurse, adult" and that he "does not give up that easily".  I shared that I understand that though unfortunately some diseases get to a point where even the most aggressive treatments cannot reverse the disease process.  We reviewed that Jago is following along the chronic disease trajectory whereby he continues to be readmitted to the hospital each readmission results and a declined mental physical and emotional state.  Reinforced my job as an extra layer of support but also is an advocate for what patient's family believes he would want.  Patient's family is clear that he would desire resuscitation though would not want long-term life supportive measures.  We reviewed that once Fahad is more alert and more formal conversations will be had with him specifically.  Patient's family request the input of the hepatology team to better optimize patient's management as well as educate them on what to expect moving forward in the setting of Shun's disease process.  Questions and concerns addressed/Palliative Support Provided.   Objective Assessment: Vital Signs Vitals:   02/23/22 1200 02/23/22 1529  BP: 125/63   Pulse: 94   Resp: (!) 30   Temp:  97.6 F (36.4 C)  SpO2: 100%     Intake/Output Summary (Last 24 hours) at 02/23/2022 1637 Last data filed at 02/23/2022 1422 Gross per 24 hour  Intake 2635.04 ml  Output 2600 ml  Net 35.04 ml  Last Weight  Most recent update: 02/23/2022  6:44 AM    Weight  71.3 kg (157 lb 3 oz)            Gen: Frail elderly Caucasian male HEENT: moist mucous membranes CV: Regular rate and rhythm PULM: On room air breathing is even and nonlabored ABD: Distended EXT: Muscle wasting  Neuro: Alert and oriented to  self  SUMMARY OF RECOMMENDATIONS   Full Code/Full Scope of Care  Appreciate Hepatology insights  Plan to speak to patient when he is more amenable  Ongoing Palliative Support  Time Spent: 25  Billing based on MDM: High  Problems Addressed: One acute or chronic illness or injury that poses a threat to life or bodily function  Amount and/or Complexity of Data: Category 3:Discussion of management or test interpretation with external physician/other qualified health care professional/appropriate source (not separately reported)  Risks: Decision regarding hospitalization or escalation of hospital care and Decision not to resuscitate or to de-escalate care because of poor prognosis ______________________________________________________________________________________ [place] Team Team Cell Phone: [phone] Please utilize secure chat with additional questions, if there is no response within 30 minutes please call the above phone number  Palliative Medicine Team providers are available by phone from 7am to 7pm daily and can be reached through the team cell phone.  Should this patient require assistance outside of these hours, please call the patient's attending physician.

## 2022-02-23 NOTE — Procedures (Signed)
Objective Swallowing Evaluation: Type of Study: FEES-Fiberoptic Endoscopic Evaluation of Swallow   Patient Details  Name: Edward [name] MRN: [id number] Date of Birth: 18-Aug-1950  Today's Date: 02/23/2022 Time: SLP Start Time (ACUTE ONLY): 9371 -SLP Stop Time (ACUTE ONLY): 6967  SLP Time Calculation (min) (ACUTE ONLY): 28 min   Past Medical History:  Past Medical History:  Diagnosis Date   Cataract    CKD (chronic kidney disease) stage 3, GFR 30-59 ml/min (H[facility])    Coronary atherosclerosis of native coronary artery    BMS LAD 2006; residual 75% distal RCA; EF 50%   Essential hypertension    Glaucoma    Low back pain    MI (myocardial infarction) ([place])    Anterior 2006   Mixed hyperlipidemia    Renal insufficiency    Type 2 diabetes mellitus ([place])    Past Surgical History:  Past Surgical History:  Procedure Laterality Date   CATARACT EXTRACTION, BILATERAL     CORONARY ANGIOPLASTY WITH STENT PLACEMENT  2006   ESOPHAGEAL BANDING  02/01/2022   Procedure: ESOPHAGEAL BANDING;  Surgeon: Eloise Harman, DO;  [facility];  Service: Endoscopy;;   ESOPHAGOGASTRODUODENOSCOPY (EGD) WITH PROPOFOL N/A 02/01/2022   Procedure: ESOPHAGOGASTRODUODENOSCOPY (EGD) WITH PROPOFOL;  Surgeon: Eloise Harman, DO;  Location[facility];  Service: Endoscopy;  Laterality: N/A;  12:30 am   HPI: Edward [name] is a 71 y.o. male who presented to [facility] 10/16 with cramping of the hands and feet.  He had had a paracentesis earlier that day with 4 L removed (see below).  While in ED, he began to spit up bright red blood.  This progressed to frank hematemesis, large-volume and persistent.  He was started on Protonix, octreotide, ceftriaxone.  He then became hypotensive; therefore, massive transfusion protocol was initiated.  He received 8 units PRBC, 2 units FFP, 1 unit platelets.  He was intubated for airway protection was also started on Levophed. Pt with EGD 10/18 with banding x6. CXR 10/18  without focal infiltrate. 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  He was apparently evaluated at [facility] for transplant but was deemed to not be a candidate.   No data recorded   Recommendations for follow up therapy are one component of a multi-disciplinary discharge planning process, led by the attending physician.  Recommendations may be updated based on patient status, additional functional criteria and insurance authorization.  Assessment / Plan / Recommendation     02/23/2022   12:29 PM  Clinical Impressions  Clinical Impression Pt presents with a mild pharyngeal dysphagia c/b reduced base of tongue retraction, incomplete laryngeal closure, and diminished sensation. These deficits resulted in static, silent penetration of thin liquid by cup and straw.  There was no penetration with nectar thick liquid by cup or straw, or puree consistencies. Pt exhibited impulsivity with drinking, but there was no penetration or aspiration noted with large serial straw sips of nectar thick liquid. Pharyngeal edema noted, especially at arytenoids.  There was mild UES, interarytenoid space reside, but this did not appear to flow into laryngeal vestibule.  There was mild pharyngeal residue with nectar thick liquid.  There was moderate vallecula residue with puree.  Pt accepted a very small bolus of regular solid and then expectorated a portion of the bolus.  A trace amount of residue of solid  was seen at tip of epiglottis, but recommend further assessment of solid textures when cleared by GI to advance diet.  Consent for FEES obtained by MD prior to SLP arrival (see ICU note).  Pt with fairly good tolerance of scope passage, but would recommend further assessment if indicated by MBSS for pt comfort.  Recommend  clear liquids, nectar thick.   SLP Visit Diagnosis Dysphagia, pharyngeal phase (R13.13)  Impact on safety and function Mild aspiration risk         02/23/2022   12:29 PM  Treatment Recommendations  Treatment Recommendations Therapy as outlined in treatment plan below        02/23/2022   12:34 PM  Prognosis  Prognosis for Safe Diet Advancement Good       02/23/2022   12:29 PM  Diet Recommendations  SLP Diet Recommendations Nectar thick liquid  Liquid Administration via Straw;No straw  Medication Administration Whole meds with liquid  Compensations Slow rate;Small sips/bites         02/23/2022   12:29 PM  Other Recommendations  Follow Up Recommendations --  Assistance recommended at discharge Intermittent Supervision/Assistance  Functional Status Assessment Patient has had a recent decline in their functional status and demonstrates the ability to make significant improvements in function in a reasonable and predictable amount of time.       02/23/2022   12:29 PM  Frequency and Duration   Speech Therapy Frequency (ACUTE ONLY) min 2x/week  Treatment Duration 2 weeks         02/23/2022   12:26 PM  Oral Phase  Oral Phase Impaired  Oral - Regular Delayed oral transit       02/23/2022   12:27 PM  Pharyngeal Phase  Pharyngeal Phase Impaired  Pharyngeal- Nectar Cup Pharyngeal residue - valleculae  Pharyngeal Material does not enter airway  Pharyngeal- Nectar Straw Pharyngeal residue - valleculae  Pharyngeal Material does not enter airway  Pharyngeal- Thin Cup Reduced airway/laryngeal closure  Pharyngeal Material enters airway, remains ABOVE vocal cords and not ejected out  Pharyngeal- Thin Straw Reduced airway/laryngeal closure  Pharyngeal Material enters airway, remains ABOVE vocal cords and not ejected out  Pharyngeal- Puree Reduced tongue base retraction;Pharyngeal residue - valleculae  Pharyngeal Material does not enter airway  Pharyngeal- Regular [facility]   Pharyngeal Material does not enter airway        02/23/2022   12:29 PM  Cervical Esophageal Phase   Cervical Esophageal Phase [facility]     Edward Savage, MA, CCC-SLP Acute Rehabilitation Services Office: [phone] 02/23/2022, 12:36 PM

## 2022-02-23 NOTE — Progress Notes (Addendum)
ICU Interim Note:   Patient needs MBBS but we are unable to obtain this today secondary to resources, will likely not have availability over the weekend. Discussed option of FEES at bedside with patient. Given options, patient is amenable to attempting FEES at bedside, verbalized consent. Risks of prolonged malnutrition are great and were weighed against risk of aspiration event.   Patient is a poor candidate for tube feeds as he has significant esophageal varices.   After FEES, continue with nectar thick CL.    Patient stable for the floor now able to PO, continue with transfer to progressive unit, discussed with [facility] for transfer 02/24/22. Transfer orders in place.   Erskine Emery, MD

## 2022-02-23 NOTE — Evaluation (Signed)
Physical Therapy Evaluation Patient Details Name: Edward [name] MRN: [id number] DOB: 07-30-1950 Today's Date: 02/23/2022  History of Present Illness  Edward [name] is a 71 y.o. male who presented to [facility] 10/16 with cramping of the hands and feet.  He had had a paracentesis earlier that day with 4 L removed (see below).  While in ED, he began to spit up bright red blood.  This progressed to frank hematemesis, large-volume and persistent.  Pt then became hypotensive; therefore, massive transfusion protocol was initiated.  He received 8 units PRBC, 2 units FFP, 1 unit platelets.  He was intubated 10/17 for airway protection was also started on Levophed. Pt with EGD 10/18 with banding x6. CXR 10/18 without focal infiltrate. Extubated 10/19.  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  He was apparently evaluated at [facility] for transplant but was deemed to not be a candidate.  Clinical Impression  Pt admitted with above diagnosis. Pt was sitting on EOB with nurse in room on arrival. Bed side rail up  and pt was trying to crawl out of bed to get water with PAS hose and Prevalon boots on.  IV lines draped on other side of bed.  Assisted nurse to calm pt down and talked pt into lying back down and positioned him upright so that nurse could do bedside swallow test.  Pt calm and sitting in  chair position in bed on PT departure. Pt currently with functional limitations due to the deficits listed below (see PT Problem List). Pt will benefit from skilled PT to increase their independence and safety with mobility to allow discharge to the venue listed below.          Recommendations for follow up therapy are one component of a multi-disciplinary discharge planning process, led by the attending physician.   Recommendations may be updated based on patient status, additional functional criteria and insurance authorization.  Follow Up Recommendations Skilled nursing-short term rehab (<3 hours/day) (unless pt has 24 hour care or his confusion clears) Can patient physically be transported by private vehicle: Yes    Assistance Recommended at Discharge Frequent or constant Supervision/Assistance  Patient can return home with the following  Two people to help with walking and/or transfers;A lot of help with bathing/dressing/bathroom;Assistance with cooking/housework;Assist for transportation;Help with stairs or ramp for entrance    Equipment Recommendations Rolling walker (2 wheels)  Recommendations for Other Services       Functional Status Assessment Patient has had a recent decline in their functional status and demonstrates the ability to make significant improvements in function in a reasonable and predictable amount of time.     Precautions / Restrictions Precautions Precautions: Fall Precaution Comments: flexi seal Restrictions Weight Bearing Restrictions: No      Mobility  Bed Mobility Overal bed mobility: Needs Assistance Bed Mobility: Supine to Sit     Supine to sit: Supervision     General bed mobility comments: Pt needs supervision for safety as he moves impulsively forgetting about his lines.    Transfers                   General transfer comment: NT due to pts agitation/confusion.    Ambulation/Gait                  Stairs  Wheelchair Mobility    Modified Rankin (Stroke Patients Only)       Balance Overall balance assessment: Needs assistance Sitting-balance support: No upper extremity supported, Feet supported Sitting balance-Leahy Scale: Fair Sitting balance - Comments: sitting EOB on arrrival wtih good balance without UE support                                     Pertinent Vitals/Pain Pain  Assessment Pain Assessment: No/denies pain    Home Living Family/patient expects to be discharged to:: Private residence Living Arrangements: Spouse/significant other Available Help at Discharge: Family;Available PRN/intermittently (states he lives with his ex wife) Type of Home: House Home Access: Stairs to enter Entrance Stairs-Rails: None Entrance Stairs-Number of Steps: 1   Home Layout: One level Home Equipment: Shower seat Additional Comments: Much of information comes from previous chart.    Prior Function Prior Level of Function : Independent/Modified Independent             Mobility Comments: Patient reports being able to ambulate at home and in the community independently with no AD. Currently driving ADLs Comments: Patient reports being indepenent with ADL's.     Hand Dominance        Extremity/Trunk Assessment   Upper Extremity Assessment Upper Extremity Assessment: Defer to OT evaluation    Lower Extremity Assessment Lower Extremity Assessment: Generalized weakness    Cervical / Trunk Assessment Cervical / Trunk Assessment: Kyphotic  Communication   Communication: Other (comment) (slurred speech and difficult to understand pt)  Cognition Arousal/Alertness: Awake/alert Behavior During Therapy: Impulsive, Restless Overall Cognitive Status: Impaired/Different from baseline Area of Impairment: Orientation, Memory, Following commands, Safety/judgement, Awareness, Problem solving                 Orientation Level: Disoriented to, Place, Time, Situation   Memory: Decreased short-term memory Following Commands: Follows one step commands inconsistently, Follows one step commands with increased time Safety/Judgement: Decreased awareness of safety, Decreased awareness of deficits   Problem Solving: Slow processing, Decreased initiation, Requires verbal cues, Difficulty sequencing, Requires tactile cues General Comments: Pt confused and climbing OOB on  arrival to room. Took incr time to calm pt and get him to lie back down.  Pt screaming for water and nurse was planning to come to a bedside swallow eval.        General Comments General comments (skin integrity, edema, etc.): VSS    Exercises General Exercises - Lower Extremity Ankle Circles/Pumps: AROM, Both, 5 reps, Supine Heel Slides: AAROM, Both, 10 reps, Supine   Assessment/Plan    PT Assessment Patient needs continued PT services  PT Problem List Decreased mobility;Decreased activity tolerance;Decreased balance;Decreased cognition;Decreased knowledge of use of DME;Decreased safety awareness;Decreased knowledge of precautions;Cardiopulmonary status limiting activity       PT Treatment Interventions DME instruction;Gait training;Functional mobility training;Therapeutic activities;Therapeutic exercise;Balance training;Patient/family education;Stair training;Cognitive remediation    PT Goals (Current goals can be found in the Care Plan section)  Acute Rehab PT Goals Patient Stated Goal: unable to state PT Goal Formulation: Patient unable to participate in goal setting Time For Goal Achievement: 03/09/22 Potential to Achieve Goals: Good    Frequency Min 3X/week     Co-evaluation               AM-PAC PT "6 Clicks" Mobility  Outcome Measure Help needed turning from your back to your side while in a flat bed without using  bedrails?: A Little Help needed moving from lying on your back to sitting on the side of a flat bed without using bedrails?: A Little Help needed moving to and from a bed to a chair (including a wheelchair)?: A Lot Help needed standing up from a chair using your arms (e.g., wheelchair or bedside chair)?: A Lot Help needed to walk in hospital room?: Total Help needed climbing 3-5 steps with a railing? : Total 6 Click Score: 12    End of Session Equipment Utilized During Treatment: Gait belt Activity Tolerance: Patient limited by fatigue Patient  left: with call bell/phone within reach;in bed;with bed alarm set;with nursing/sitter in room Nurse Communication: Mobility status PT Visit Diagnosis: Unsteadiness on feet (R26.81);Other abnormalities of gait and mobility (R26.89)    Time: 4818-5909 PT Time Calculation (min) (ACUTE ONLY): 17 min   Charges:   PT Evaluation $PT Eval Moderate Complexity: 1 Mod          Rigoberto Repass M,PT Acute Rehab Services [phone]   Alvira Philips 02/23/2022, 1:13 PM

## 2022-02-24 DIAGNOSIS — Z515 Encounter for palliative care: Secondary | ICD-10-CM | POA: Diagnosis not present

## 2022-02-24 DIAGNOSIS — R578 Other shock: Secondary | ICD-10-CM | POA: Diagnosis not present

## 2022-02-24 DIAGNOSIS — I8501 Esophageal varices with bleeding: Secondary | ICD-10-CM | POA: Diagnosis not present

## 2022-02-24 LAB — CBC
HCT: 27.6 % — ABNORMAL LOW (ref 39.0–52.0)
Hemoglobin: 9.2 g/dL — ABNORMAL LOW (ref 13.0–17.0)
MCH: 30.8 pg (ref 26.0–34.0)
MCHC: 33.3 g/dL (ref 30.0–36.0)
MCV: 92.3 fL (ref 80.0–100.0)
Platelets: 49 10*3/uL — ABNORMAL LOW (ref 150–400)
RBC: 2.99 MIL/uL — ABNORMAL LOW (ref 4.22–5.81)
RDW: 15.9 % — ABNORMAL HIGH (ref 11.5–15.5)
WBC: 6.2 10*3/uL (ref 4.0–10.5)
nRBC: 0 % (ref 0.0–0.2)

## 2022-02-24 LAB — TYPE AND SCREEN
ABO/RH(D): AB POS
Antibody Screen: NEGATIVE
Unit division: 0
Unit division: 0
Unit division: 0
Unit division: 0
Unit division: 0
Unit division: 0

## 2022-02-24 LAB — BASIC METABOLIC PANEL
Anion gap: 12 (ref 5–15)
BUN: 77 mg/dL — ABNORMAL HIGH (ref 8–23)
CO2: 21 mmol/L — ABNORMAL LOW (ref 22–32)
Calcium: 9.2 mg/dL (ref 8.9–10.3)
Chloride: 105 mmol/L (ref 98–111)
Creatinine, Ser: 2.82 mg/dL — ABNORMAL HIGH (ref 0.61–1.24)
GFR, Estimated: 23 mL/min — ABNORMAL LOW (ref >60)
Glucose, Bld: 63 mg/dL — ABNORMAL LOW (ref 70–99)
Potassium: 3.1 mmol/L — ABNORMAL LOW (ref 3.5–5.1)
Sodium: 138 mmol/L (ref 135–145)

## 2022-02-24 LAB — GLUCOSE, CAPILLARY
Glucose-Capillary: 67 mg/dL — ABNORMAL LOW (ref 70–99)
Glucose-Capillary: 67 mg/dL — ABNORMAL LOW (ref 70–99)
Glucose-Capillary: 95 mg/dL (ref 70–99)
Glucose-Capillary: 116 mg/dL — ABNORMAL HIGH (ref 70–99)
Glucose-Capillary: 131 mg/dL — ABNORMAL HIGH (ref 70–99)
Glucose-Capillary: 109 mg/dL — ABNORMAL HIGH (ref 70–99)
Glucose-Capillary: 162 mg/dL — ABNORMAL HIGH (ref 70–99)

## 2022-02-24 LAB — BPAM RBC
Blood Product Expiration Date: 202311122359
Blood Product Expiration Date: 202311122359
Blood Product Expiration Date: 202311122359
Blood Product Expiration Date: 202311122359
Blood Product Expiration Date: 202311142359
Blood Product Expiration Date: 202311142359
ISSUE DATE / TIME: 202310172230
ISSUE DATE / TIME: 202310180245
Unit Type and Rh: 6200
Unit Type and Rh: 6200
Unit Type and Rh: 6200
Unit Type and Rh: 6200
Unit Type and Rh: 6200
Unit Type and Rh: 6200

## 2022-02-24 LAB — CULTURE, BODY FLUID W GRAM STAIN -BOTTLE: Culture: NO GROWTH

## 2022-02-24 LAB — PHOSPHORUS: Phosphorus: 4.1 mg/dL (ref 2.5–4.6)

## 2022-02-24 LAB — MAGNESIUM: Magnesium: 2.1 mg/dL (ref 1.7–2.4)

## 2022-02-24 MED ORDER — ALBUTEROL SULFATE (2.5 MG/3ML) 0.083% IN NEBU: 3.0000 mL | INHALATION_SOLUTION | Freq: Four times a day (QID) | RESPIRATORY_TRACT | Status: DC | PRN

## 2022-02-24 MED ORDER — BRIMONIDINE TARTRATE 0.2 % OP SOLN
1.0000 [drp] | Freq: Two times a day (BID) | OPHTHALMIC | Status: DC
  Administered 2022-02-24 – 2022-03-01 (×11): 1 [drp] via OPHTHALMIC
  Filled 2022-02-24 (×2): qty 5

## 2022-02-24 MED ORDER — POTASSIUM CHLORIDE 20 MEQ PO PACK
40.0000 meq | PACK | ORAL | Status: AC
  Administered 2022-02-24: 40 meq via ORAL
  Filled 2022-02-24 (×2): qty 2

## 2022-02-24 MED ORDER — TAMSULOSIN HCL 0.4 MG PO CAPS
0.4000 mg | ORAL_CAPSULE | Freq: Every day | ORAL | Status: DC
  Administered 2022-02-24 – 2022-02-28 (×5): 0.4 mg via ORAL
  Filled 2022-02-24 (×5): qty 1

## 2022-02-24 NOTE — TOC Initial Note (Signed)
Transition of Care [facility]) - Initial/Assessment Note    Patient Details  Name: Edward [name] MRN: [id number] Date of Birth: [birth year]  [facility] CM/SW Contact:    Benard Halsted, LCSW Phone Number: 02/24/2022, 11:46 AM  Clinical Narrative:                 CSW received consult for possible SNF placement at time of discharge. CSW spoke with patient's son, Edward [name] (home # [phone]. Mobile # listed is for Edward [name]'s sister). CSW discussed SNF recommendation with him and he reported that patient has been doing well at home with them and he does not feel that patient needs SNF placement as long as he can use a walker. CSW explained that currently patient has not been doing so. He stated that he is hopeful for improvement. CSW discussed potential home health needs if they decline SNF placement. He stated they will have to discuss that as a family. CSW confirmed their address is: [address], [place], [place]. Will continue to follow.    Expected Discharge Plan: [place] Barriers to Discharge: Continued Medical Work up   Patient Goals and CMS Choice Patient states their goals for this hospitalization and ongoing recovery are:: Return home CMS Medicare.gov Compare Post Acute Care list provided to:: Patient Represenative (must comment) Choice offered to / list presented to : Adult Children  Expected Discharge Plan and Services Expected Discharge Plan: [place] In-house Referral: Clinical Social Work     Living arrangements for the past 2 months: Single Family Home                                      Prior Living Arrangements/Services Living arrangements for the past 2 months: Single Family Home Lives with:: Adult Children, Spouse Patient language and need for interpreter reviewed:: Yes Do you feel safe going back to the place where you live?: Yes      Need for Family Participation in Patient Care: Yes (Comment) Care giver  support system in place?: Yes (comment)   Criminal Activity/Legal Involvement Pertinent to Current Situation/Hospitalization: No - Comment as needed  Activities of Daily Living Home Assistive Devices/Equipment: Cane (specify quad or straight), Blood pressure cuff ADL Screening (condition at time of admission) Patient's cognitive ability adequate to safely complete daily activities?: Yes Is the patient deaf or have difficulty hearing?: No Does the patient have difficulty seeing, even when wearing glasses/contacts?: No Does the patient have difficulty concentrating, remembering, or making decisions?: Yes Patient able to express need for assistance with ADLs?: Yes Does the patient have difficulty dressing or bathing?: Yes Independently performs ADLs?: No Communication: Independent Dressing (OT): Needs assistance Is this a change from baseline?: Change from baseline, expected to last >3 days Grooming: Needs assistance Is this a change from baseline?: Change from baseline, expected to last >3 days Feeding: Independent Bathing: Needs assistance Is this a change from baseline?: Change from baseline, expected to last >3 days Toileting: Needs assistance Is this a change from baseline?: Change from baseline, expected to last >3days In/Out Bed: Needs assistance Is this a change from baseline?: Change from baseline, expected to last >3 days Walks in Home: Independent Does the patient have difficulty walking or climbing stairs?: Yes Weakness of Legs: Both Weakness of Arms/Hands: Both  Permission Sought/Granted Permission sought to share information with : Customer service manager, Family Supports  Permission granted to share information with : No  Share Information with NAME: Edward [name]/Billy     Permission granted to share info w Relationship: Ex-wife/Son  Permission granted to share info w Contact Information: [phone]  Emotional Assessment Appearance:: Appears stated  age Attitude/Demeanor/Rapport: Unable to Assess Affect (typically observed): Unable to Assess Orientation: : Oriented to Self, Oriented to Place, Oriented to Situation Alcohol / Substance Use: Not Applicable Psych Involvement: No (comment)  Admission diagnosis:  Hemorrhagic shock ([place]) [R57.8] GI bleed [K92.2] Bleeding esophageal varices, unspecified esophageal varices type (HCC) [I85.01] Hematemesis, unspecified whether nausea present [K92.0] Patient Active Problem List   Diagnosis Date Noted   Acute blood loss anemia    Protein-calorie malnutrition, severe 02/21/2022   GI bleed 02/20/2022   Pressure injury of skin 02/20/2022   Bleeding esophageal varices (HCC)    Hematemesis    Hemorrhagic shock (HCC)    Respiratory insufficiency    Anemia of chronic disease 02/14/2022   Cirrhosis of liver with ascites ([place]) 02/13/2022   Sepsis secondary to UTI ([place]) 02/08/2022   Fever 02/04/2022   Secondary esophageal varices without bleeding (HCC)    Hypokalemia 12/09/2021   Massive volume overload 12/08/2021   AKI (acute kidney injury) ([place])    Anasarca    Hypoalbuminemia    Pneumonia 11/27/2021   Hypoglycemia 11/27/2021   Chronic cholecystitis    Shortness of breath    Other ascites    Abnormal CT scan, esophagus    Non-traumatic rhabdomyolysis    Decompensated hepatic cirrhosis (HCC)    Abnormal liver function tests 11/18/2021   Gallstones with acute cholecystitis 11/18/2021   Acute Calculous cholecystitis with Elevated LFTs 11/18/2021   Thrombocytopenia ([place]) 11/18/2021   Iron deficiency anemia 11/18/2021   Type 2 diabetes with nephropathy ([place]) 02/20/2021   Gastroesophageal reflux disease without esophagitis 08/16/2020   Essential hypertension 08/05/2015   Insomnia 03/26/2014   CKD (chronic kidney disease) stage 3B, GFR 30-59 ml/min ([place]) 03/08/2014   Coronary atherosclerosis of native coronary artery 10/03/2010   Mixed hyperlipidemia 11/24/2008   PCP:  [name], FNP Pharmacy:   [facility] [address], [place] [address] [place] [address] Phone: [phone] Fax: [phone]     Social Determinants of Health (SDOH) Interventions    Readmission Risk Interventions    02/24/2022   11:43 AM 11/28/2021    4:08 PM 11/22/2021    2:53 PM  Readmission Risk Prevention Plan  Transportation Screening Complete Complete Complete  PCP or Specialist Appt within 3-5 Days  Not Complete   Home Care Screening   Patient refused  Medication Review (RN CM)   Complete  HRI or Home Care Consult  Complete   Social Work Consult for [place] Planning/Counseling  Complete   Palliative Care Screening  Complete   Medication Review Press photographer) Complete Complete   PCP or Specialist appointment within 3-5 days of discharge Complete    HRI or [place] Complete    SW Recovery Care/Counseling Consult Complete    Palliative Care Screening Complete    [place] Patient Refused

## 2022-02-24 NOTE — Progress Notes (Signed)
Palliative Medicine Inpatient Follow Up Note HPI: 71 y.o. male  with past medical history of cirrhosis with ascites undergoing serial paracenteses, portal hypertension with paraesophageal and perigastric varices (s/p esophageal banding 02/01/22 after surveillance EGD), concern for Wilson's disease, CAD, DM, HTN, and GERD admitted on 02/20/2022 with cramping in hands and feet.  While in the ED he developed frank hematemesis.  Patient became hypotensive and unresponsive and ultimately required intubation.  GI bleed due to esophageal varices -he had 6 bands during EGD 10/18.  Patient also with AKI, likely hepatorenal syndrome.  Patient remains on ventilator and low-dose vasopressors.  Patient with frequent hospital admissions recently.  PMT consulted to discuss goals of care with family.  Today's Discussion 02/24/2022  *Please note that this is a verbal dictation therefore any spelling or grammatical errors are due to the "[place] [employer]" system interpretation.  Chart reviewed inclusive of vital signs, progress notes, laboratory results, and diagnostic images.   Sonia Side and I met this morning. He is more clear minded. We reviewed his present health state and poor overall prognosis.  Discussed that in the setting of his active cirrhosis and recurrent rehospitalization's that he has been experiencing the chronic medical disease trajectory.  I shared with him great concern over what the future may look like in the setting of his advanced disease.  We discussed that he is not a liver transplant candidate and that I anticipate he will continue to be rehospitalized into the near future.  We reviewed that each hospitalization takes a significant toll on mind-body and spirit.  He acknowledged this and does share that the events which have occurred since July have all been rather surprising to him.  Sonia Side and I discussed his cardiopulmonary resuscitation status.  He was clear and that he would want an attempt  made at resuscitation though as his c for only shared yesterday he would not want long-term life support.  He shares that he would not want a gastrostomy tube though would be okay with short-term feeding through a nasogastric tube.  Korby was quite anxious to get out of bed this morning he was able to get up with one-person assist and mobilize to the chair.  He he expresses that he has not had solid food in a number of days.  We reviewed the importance of speech therapy evaluating him and making further recommendations from there.  Presently on a clear liquid nectar thick diet.  Questions and concerns addressed/Palliative Support Provided.   Objective Assessment: Vital Signs Vitals:   02/24/22 0400 02/24/22 0736  BP: 107/62 (!) 95/57  Pulse: 93 89  Resp: 17 16  Temp:  98.1 F (36.7 C)  SpO2: 95% 97%    Intake/Output Summary (Last 24 hours) at 02/24/2022 1247 Last data filed at 02/24/2022 9150 Gross per 24 hour  Intake 2464.46 ml  Output 3900 ml  Net -1435.54 ml    Last Weight  Most recent update: 02/24/2022  3:48 AM    Weight  72.6 kg (160 lb 0.9 oz)            Gen: Frail elderly Caucasian male HEENT: moist mucous membranes CV: Regular rate and rhythm PULM: On room air breathing is even and nonlabored ABD: Distended EXT: Muscle wasting  Neuro: Alert and oriented x3 -poor safety awareness at times  SUMMARY OF RECOMMENDATIONS   Full Code/Full Scope of Care --> would not desire long-term life support nor would he desire a long-term gastrostomy tube  Appreciate Hepatology insights -->  patient to follow-up with [facility] per inpatient service 51-monthmortality risk is quite elevated MELD of 30 not accounting for recent varices  Appreciate speech therapy involvement for additional liberation of diet  Ongoing Palliative Support  Time Spent: 718 Billing based on MDM: High  Problems Addressed: One acute or chronic illness or injury that poses a threat to life or  bodily function  Amount and/or Complexity of Data: Category 3:Discussion of management or test interpretation with external physician/other qualified health care professional/appropriate source (not separately reported)  Risks: Decision regarding hospitalization or escalation of hospital care and Decision not to resuscitate or to de-escalate care because of poor prognosis ______________________________________________________________________________________ [facility] Team Cell Phone: [phone] utilize secure chat with additional questions, if there is no response within 30 minutes please call the above phone number  Palliative Medicine Team providers are available by phone from 7am to 7pm daily and can be reached through the team cell phone.  Should this patient require assistance outside of these hours, please call the patient's attending physician.

## 2022-02-24 NOTE — Progress Notes (Signed)
Hypoglycemic Event  CBG: 66    Treatment: 4 oz juice/soda  Symptoms: Pale  Follow-up CBG: Time: 0350 CBG Result: 95  Possible Reasons for Event: Inadequate meal intake  Comments/MD notified:Patient stable. CBG ordered to be assessed every 4 hours. PO Juice provided and glucose increased from 66 to 95. No s/s of distress. Will continue to monitor.     Gwyndolyn Kaufman

## 2022-02-24 NOTE — Progress Notes (Signed)
  Progress Note   Patient: Edward [name] [facility] SPQ:[id number] DOB: 12-04-1950 DOA: 02/20/2022     4 DOS: the patient was seen and examined on 02/24/2022   Brief hospital course: 71yo with hx cirrhosis, wilson's disease, known esophageal varices presented with large volume hematemesis and hemodynamic instability. Pt required large volume PRBC transfusion and underwent EGD with banding of varices. Hemodynamics improved and pt transferred to [facility]   Assessment and Plan: Acute blood loss anemia secondary to bleeding esophageal varices -Requiring large volume PRBC transfusion this visit -GI had been following, now s/p banding -Hgb thus far stable, will cont to follow trends -Seen by slp with recs for clears, nectar thick liquids -Overall poor prognosis  Cirrhosis with ascites with Wilson's disease -Had been continued on PPI -did require octreotide gtt, now off -receiving empiric rocephin for SBP prophylaxis -Discussed with GI. Overall poor prognosis. Meld noted to be 30 which correlates to near 50% mortality in the next 3 months, not including high risk of death from UGI bleed alone -Appreciate input by Palliative Care who is helping establish goals of care  Toxic metabolic encephalopathy -continue lactulose as tolerated -Most recent ammonia of 91 on 10/19. Would cont to follow trends  Coagulopathy -Likely secondary to liver disease -cont to monior  Acute respiratory failure -On minimal O2 support  ARF on CKD 3b -Cr improving -Recheck bmet in AM  CAD -Seems stable at this time  DM, uncontrolled with hyperglycemia -glycemic trends appear stable -continue SSI as needed  Severe protein calorie malnutrition -Seen by dietitian -cleared for clear diet with nectar liquids per SLP      Subjective: Unable to assess given mentation  Physical Exam: Vitals:   02/24/22 0327 02/24/22 0347 02/24/22 0400 02/24/22 0736  BP: 106/70  107/62 (!) 95/57  Pulse: 92  93 89  Resp: '16  17 16   '$ Temp: 98.7 F (37.1 C)   98.1 F (36.7 C)  TempSrc: Oral     SpO2: 96%  95% 97%  Weight:  72.6 kg    Height:       General exam: Asleep, laying in bed, in nad Respiratory system: Normal respiratory effort, no wheezing Cardiovascular system: regular rate, s1, s2 Gastrointestinal system: Soft, nondistended, positive BS Central nervous system: CN2-12 grossly intact, strength intact Extremities: Perfused, no clubbing Skin: Normal skin turgor, no notable skin lesions seen Psychiatry: difficult to assess given mentation   Data Reviewed:  Labs reviewed: Na 138, K 3.1, Cr 2.82, Hgb 9.2   Family Communication: Pt in room, family not at bedside  Disposition: Status is: Inpatient Remains inpatient appropriate because: Severity of illness  Planned Discharge Destination: Skilled nursing facility     Author: Marylu Lund, MD 02/24/2022 6:23 PM  For on call review [email or web address].

## 2022-02-25 ENCOUNTER — Encounter (HOSPITAL_COMMUNITY): Payer: Self-pay | Admitting: Gastroenterology

## 2022-02-25 DIAGNOSIS — Z515 Encounter for palliative care: Secondary | ICD-10-CM | POA: Diagnosis not present

## 2022-02-25 DIAGNOSIS — K920 Hematemesis: Secondary | ICD-10-CM | POA: Diagnosis not present

## 2022-02-25 DIAGNOSIS — D62 Acute posthemorrhagic anemia: Secondary | ICD-10-CM | POA: Diagnosis not present

## 2022-02-25 LAB — AMMONIA: Ammonia: 32 umol/L (ref 9–35)

## 2022-02-25 LAB — CBC
HCT: 24.5 % — ABNORMAL LOW (ref 39.0–52.0)
Hemoglobin: 8.3 g/dL — ABNORMAL LOW (ref 13.0–17.0)
MCH: 31.2 pg (ref 26.0–34.0)
MCHC: 33.9 g/dL (ref 30.0–36.0)
MCV: 92.1 fL (ref 80.0–100.0)
Platelets: 38 10*3/uL — ABNORMAL LOW (ref 150–400)
RBC: 2.66 MIL/uL — ABNORMAL LOW (ref 4.22–5.81)
RDW: 15.6 % — ABNORMAL HIGH (ref 11.5–15.5)
WBC: 3.5 10*3/uL — ABNORMAL LOW (ref 4.0–10.5)
nRBC: 0 % (ref 0.0–0.2)

## 2022-02-25 LAB — GLUCOSE, CAPILLARY
Glucose-Capillary: 106 mg/dL — ABNORMAL HIGH (ref 70–99)
Glucose-Capillary: 141 mg/dL — ABNORMAL HIGH (ref 70–99)
Glucose-Capillary: 164 mg/dL — ABNORMAL HIGH (ref 70–99)
Glucose-Capillary: 221 mg/dL — ABNORMAL HIGH (ref 70–99)
Glucose-Capillary: 71 mg/dL (ref 70–99)

## 2022-02-25 LAB — COMPREHENSIVE METABOLIC PANEL
ALT: 23 U/L (ref 0–44)
AST: 39 U/L (ref 15–41)
Albumin: 2.8 g/dL — ABNORMAL LOW (ref 3.5–5.0)
Alkaline Phosphatase: 36 U/L — ABNORMAL LOW (ref 38–126)
Anion gap: 10 (ref 5–15)
BUN: 69 mg/dL — ABNORMAL HIGH (ref 8–23)
CO2: 20 mmol/L — ABNORMAL LOW (ref 22–32)
Calcium: 8.5 mg/dL — ABNORMAL LOW (ref 8.9–10.3)
Chloride: 106 mmol/L (ref 98–111)
Creatinine, Ser: 2.61 mg/dL — ABNORMAL HIGH (ref 0.61–1.24)
GFR, Estimated: 26 mL/min — ABNORMAL LOW (ref >60)
Glucose, Bld: 118 mg/dL — ABNORMAL HIGH (ref 70–99)
Potassium: 3.4 mmol/L — ABNORMAL LOW (ref 3.5–5.1)
Sodium: 136 mmol/L (ref 135–145)
Total Bilirubin: 2.4 mg/dL — ABNORMAL HIGH (ref 0.3–1.2)
Total Protein: 4.5 g/dL — ABNORMAL LOW (ref 6.5–8.1)

## 2022-02-25 MED ORDER — MORPHINE SULFATE (PF) 2 MG/ML IV SOLN: 1.0000 mg | INTRAVENOUS | Status: DC | PRN

## 2022-02-25 MED ORDER — POTASSIUM CHLORIDE 20 MEQ PO PACK
40.0000 meq | PACK | Freq: Once | ORAL | Status: AC
  Administered 2022-02-25: 40 meq via ORAL
  Filled 2022-02-25: qty 2

## 2022-02-25 NOTE — Progress Notes (Signed)
  Progress Note   Patient: Edward [name] [facility] LKT:[id number] DOB: 06-15-1950 DOA: 02/20/2022     5 DOS: the patient was seen and examined on 02/25/2022   Brief hospital course: 71yo with hx cirrhosis, wilson's disease, known esophageal varices presented with large volume hematemesis and hemodynamic instability. Pt required large volume PRBC transfusion and underwent EGD with banding of varices. Hemodynamics improved and pt transferred to [facility]   Assessment and Plan: Acute blood loss anemia secondary to bleeding esophageal varices -Requiring large volume PRBC transfusion this visit -GI had been following, now s/p banding -Hgb thus far stable, will cont to follow trends -Seen by slp with recs for clears, nectar thick liquids -Overall poor prognosis  Cirrhosis with ascites with Wilson's disease -Had been continued on PPI -did require octreotide gtt, now off -receiving empiric rocephin for SBP prophylaxis -Discussed with GI. Overall poor prognosis. Meld noted to be 30 which correlates to near 50% mortality in the next 3 months, not including high risk of death from UGI bleed alone -Appreciate input by Palliative Care who is helping establish goals of care  Toxic metabolic encephalopathy -continue lactulose as tolerated -Most recent ammonia of 91 on 10/19. Ammonia normalized to 32  Coagulopathy -Likely secondary to liver disease -cont to monior  Acute respiratory failure -On minimal O2 support  ARF on CKD 3b -Cr improving -Recheck bmet in AM  CAD -Seems stable at this time  DM, uncontrolled with hyperglycemia -glycemic trends appear stable -continue SSI as needed  Severe protein calorie malnutrition -Seen by dietitian -cleared for clear diet with nectar liquids per SLP -GI had mentioned clear diet in their last note      Subjective: Reports feeling better today. Became very tearful upon learning of his poor prognosis. Agreeable to prayer with chaplain  Physical  Exam: Vitals:   02/24/22 0736 02/24/22 2100 02/25/22 0300 02/25/22 0400  BP: (!) 95/57 (!) 103/52  (!) 101/58  Pulse: 89 74 (!) 31 78  Resp: '16 18 14 16  '$ Temp: 98.1 F (36.7 C) 98.4 F (36.9 C)  97.9 F (36.6 C)  TempSrc:  Oral  Oral  SpO2: 97% 95% 97% 96%  Weight:   72.7 kg   Height:       General exam: Conversant, in no acute distress Respiratory system: normal chest rise, clear, no audible wheezing Cardiovascular system: regular rhythm, s1-s2 Gastrointestinal system: Nondistended, nontender, pos BS Central nervous system: No seizures, no tremors Extremities: No cyanosis, no joint deformities Skin: No rashes, no pallor Psychiatry: Affect normal // no auditory hallucinations   Data Reviewed:  Labs reviewed: Na 136, K 3.4, Cr 2.61, Hgb 9.2   Family Communication: Pt in room, family not at bedside  Disposition: Status is: Inpatient Remains inpatient appropriate because: Severity of illness  Planned Discharge Destination: Skilled nursing facility     Author: Marylu Lund, MD 02/25/2022 5:14 PM  For on call review [email or web address].

## 2022-02-25 NOTE — Progress Notes (Signed)
Palliative Medicine Inpatient Follow Up Note HPI: 71 y.o. male  with past medical history of cirrhosis with ascites undergoing serial paracenteses, portal hypertension with paraesophageal and perigastric varices (s/p esophageal banding 02/01/22 after surveillance EGD), concern for Wilson's disease, CAD, DM, HTN, and GERD admitted on 02/20/2022 with cramping in hands and feet.  While in the ED he developed frank hematemesis.  Patient became hypotensive and unresponsive and ultimately required intubation.  GI bleed due to esophageal varices -he had 6 bands during EGD 10/18.  Patient also with AKI, likely hepatorenal syndrome.  Patient remains on ventilator and low-dose vasopressors.  Patient with frequent hospital admissions recently.  PMT consulted to discuss goals of care with family.  Today's Discussion 02/25/2022  *Please note that this is a verbal dictation therefore any spelling or grammatical errors are due to the "[place] [employer]" system interpretation.  Chart reviewed inclusive of vital signs, progress notes, laboratory results, and diagnostic images.   I met with Alessio this morning. He is awake and alert. We discussed his present health state in the setting of his cirrhosis. Reviewed what the GI team had shared in terms of mortality risk. Pearl was very surprised by this. He shares that he had not hear this from anyone. He realizes that he has bad disease though. He expresses the desire to continue to seek options in the future. He shares a lack of readiness for the end of his life.   Zayvon's greatest grievance today is the fact that he has not eaten yet. _________________________  I met with Gianluca, his ex-spouse, SIL, and two son(s) at bedside this evening. We reviewed patient current health state. Patients family remains hopeful for improvements. We again discussed the patients diet which family is hopeful the GI team will advance in the oncoming days.  Patient and family are all  clear in terms of patients desire to remain a full code.  We discussed that family is worried about another bleeding episode and want to be as prepared as possible should that occur.  Haleem and I took a walk over > 336f together. He was minimal assistance.   Discussed patients ascites and the likely need for another paracentesis though I shared that this would be deferred to the primary team.   Questions and concerns addressed.  Objective Assessment: Vital Signs Vitals:   02/25/22 0300 02/25/22 0400  BP:  (!) 101/58  Pulse: (!) 31 78  Resp: 14 16  Temp:  97.9 F (36.6 C)  SpO2: 97% 96%   No intake or output data in the 24 hours ending 02/25/22 1416  Last Weight  Most recent update: 02/25/2022  4:08 AM    Weight  72.7 kg (160 lb 4.4 oz)            Gen: Frail elderly Caucasian male HEENT: moist mucous membranes CV: Regular rate and rhythm PULM: On room air breathing is even and nonlabored ABD: Distended EXT: Muscle wasting  Neuro: Alert and oriented x3 -poor safety awareness at times  SUMMARY OF RECOMMENDATIONS   Full Code/Full Scope of Care --> would not desire long-term life support nor would he desire a long-term gastrostomy tube  Appreciate Hepatology insights --> patient to follow-up with [facility] per inpatient service 344-monthortality risk is quite elevated MELD of 30 not accounting for recent varices  Appreciate speech therapy involvement for additional liberation of diet  Ongoing Palliative Support  Billing based on MDM: High __________________________________ [facility]  Team Cell Phone: [phone] Please utilize secure chat with additional questions, if there is no response within 30 minutes please call the above phone number  Palliative Medicine Team providers are available by phone from 7am to 7pm daily and can be reached through the team cell phone.  Should this patient require assistance  outside of these hours, please call the patient's attending physician.

## 2022-02-26 DIAGNOSIS — K920 Hematemesis: Secondary | ICD-10-CM | POA: Diagnosis not present

## 2022-02-26 DIAGNOSIS — D62 Acute posthemorrhagic anemia: Secondary | ICD-10-CM | POA: Diagnosis not present

## 2022-02-26 LAB — GLUCOSE, CAPILLARY
Glucose-Capillary: 110 mg/dL — ABNORMAL HIGH (ref 70–99)
Glucose-Capillary: 114 mg/dL — ABNORMAL HIGH (ref 70–99)
Glucose-Capillary: 116 mg/dL — ABNORMAL HIGH (ref 70–99)
Glucose-Capillary: 193 mg/dL — ABNORMAL HIGH (ref 70–99)
Glucose-Capillary: 93 mg/dL (ref 70–99)
Glucose-Capillary: 95 mg/dL (ref 70–99)

## 2022-02-26 LAB — COMPREHENSIVE METABOLIC PANEL
ALT: 29 U/L (ref 0–44)
AST: 41 U/L (ref 15–41)
Albumin: 2.8 g/dL — ABNORMAL LOW (ref 3.5–5.0)
Alkaline Phosphatase: 40 U/L (ref 38–126)
Anion gap: 7 (ref 5–15)
BUN: 59 mg/dL — ABNORMAL HIGH (ref 8–23)
CO2: 23 mmol/L (ref 22–32)
Calcium: 8.6 mg/dL — ABNORMAL LOW (ref 8.9–10.3)
Chloride: 106 mmol/L (ref 98–111)
Creatinine, Ser: 2.49 mg/dL — ABNORMAL HIGH (ref 0.61–1.24)
GFR, Estimated: 27 mL/min — ABNORMAL LOW (ref >60)
Glucose, Bld: 101 mg/dL — ABNORMAL HIGH (ref 70–99)
Potassium: 3.4 mmol/L — ABNORMAL LOW (ref 3.5–5.1)
Sodium: 136 mmol/L (ref 135–145)
Total Bilirubin: 2.8 mg/dL — ABNORMAL HIGH (ref 0.3–1.2)
Total Protein: 4.6 g/dL — ABNORMAL LOW (ref 6.5–8.1)

## 2022-02-26 LAB — BPAM FFP
Blood Product Expiration Date: 202310222359
Blood Product Expiration Date: 202310222359
Blood Product Expiration Date: 202310222359
ISSUE DATE / TIME: 202310170437
ISSUE DATE / TIME: 202310170437
Unit Type and Rh: 6200
Unit Type and Rh: 6200
Unit Type and Rh: 8400

## 2022-02-26 LAB — PREPARE FRESH FROZEN PLASMA
Unit division: 0
Unit division: 0
Unit division: 0

## 2022-02-26 LAB — CBC
HCT: 25.8 % — ABNORMAL LOW (ref 39.0–52.0)
Hemoglobin: 8.6 g/dL — ABNORMAL LOW (ref 13.0–17.0)
MCH: 31 pg (ref 26.0–34.0)
MCHC: 33.3 g/dL (ref 30.0–36.0)
MCV: 93.1 fL (ref 80.0–100.0)
Platelets: 44 10*3/uL — ABNORMAL LOW (ref 150–400)
RBC: 2.77 MIL/uL — ABNORMAL LOW (ref 4.22–5.81)
RDW: 15.5 % (ref 11.5–15.5)
WBC: 3.6 10*3/uL — ABNORMAL LOW (ref 4.0–10.5)
nRBC: 0 % (ref 0.0–0.2)

## 2022-02-26 MED ORDER — POTASSIUM CHLORIDE 20 MEQ PO PACK
60.0000 meq | PACK | Freq: Once | ORAL | Status: AC
  Administered 2022-02-26: 60 meq via ORAL
  Filled 2022-02-26: qty 3

## 2022-02-26 MED ORDER — INSULIN ASPART 100 UNIT/ML IJ SOLN
0.0000 [IU] | Freq: Every day | INTRAMUSCULAR | Status: DC
  Administered 2022-02-28: 3 [IU] via SUBCUTANEOUS

## 2022-02-26 MED ORDER — INSULIN ASPART 100 UNIT/ML IJ SOLN
0.0000 [IU] | Freq: Three times a day (TID) | INTRAMUSCULAR | Status: DC
  Administered 2022-02-26: 2 [IU] via SUBCUTANEOUS
  Administered 2022-02-27: 7 [IU] via SUBCUTANEOUS
  Administered 2022-02-27: 1 [IU] via SUBCUTANEOUS
  Administered 2022-02-28 (×2): 3 [IU] via SUBCUTANEOUS
  Administered 2022-02-28 – 2022-03-01 (×2): 2 [IU] via SUBCUTANEOUS
  Administered 2022-03-01: 7 [IU] via SUBCUTANEOUS

## 2022-02-26 MED ORDER — MIDODRINE HCL 5 MG PO TABS
20.0000 mg | ORAL_TABLET | Freq: Three times a day (TID) | ORAL | Status: DC
  Administered 2022-02-26 – 2022-03-01 (×10): 20 mg via ORAL
  Filled 2022-02-26 (×10): qty 4

## 2022-02-26 MED ORDER — TRAZODONE HCL 50 MG PO TABS
50.0000 mg | ORAL_TABLET | Freq: Every day | ORAL | Status: DC
  Administered 2022-02-26 – 2022-02-28 (×3): 50 mg via ORAL
  Filled 2022-02-26 (×3): qty 1

## 2022-02-26 NOTE — Progress Notes (Signed)
Physical Therapy Treatment Patient Details Name: Edward [name] MRN: [id number] DOB: Jul 11, 1950 Today's Date: 02/26/2022   History of Present Illness Edward [name] is a 71 y.o. male who presented to [facility] 10/16 with cramping of the hands and feet.  He had had a paracentesis earlier that day with 4 L removed (see below).  While in ED, he began to spit up bright red blood.  This progressed to frank hematemesis, large-volume and persistent.  Pt then became hypotensive; therefore, massive transfusion protocol was initiated.  He received 8 units PRBC, 2 units FFP, 1 unit platelets.  He was intubated 10/17 for airway protection was also started on Levophed. Pt with EGD 10/18 with banding x6. CXR 10/18 without focal infiltrate. Extubated 10/19.  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  He was apparently evaluated at [facility] for transplant but was deemed to not be a candidate.    PT Comments    Pt received supine and agreeable to session with good progress towards acute goals. Pt with improved mentation/cognition versus prior PT session and able to advance functional mobility demonstrating gait for >200' with RW and min guard. Pt with fair safety awareness throughout, demonstrating good hand placement throughout transfers. Pt agreeable to time up in chair at end of session and verbalizing understanding of benefits of continued mobility. Pt continues to benefit from skilled PT services to progress toward functional mobility goals.     Recommendations for follow up therapy are one component of a multi-disciplinary discharge planning process, led by the attending physician.  Recommendations may be updated based on patient status, additional functional criteria and insurance authorization.  Follow Up  Recommendations  Skilled nursing-short term rehab (<3 hours/day) Can patient physically be transported by private vehicle: Yes   Assistance Recommended at Discharge Frequent or constant Supervision/Assistance  Patient can return home with the following Two people to help with walking and/or transfers;A lot of help with bathing/dressing/bathroom;Assistance with cooking/housework;Assist for transportation;Help with stairs or ramp for entrance   Equipment Recommendations  Rolling walker (2 wheels)    Recommendations for Other Services       Precautions / Restrictions Precautions Precautions: Fall Precaution Comments: flexi seal Restrictions Weight Bearing Restrictions: No     Mobility  Bed Mobility Overal bed mobility: Needs Assistance Bed Mobility: Supine to Sit     Supine to sit: Supervision          Transfers Overall transfer level: Needs assistance Equipment used: Rolling walker (2 wheels) Transfers: Sit to/from Stand Sit to Stand: Min guard           General transfer comment: pt with good hand placemeent without cues, min guard for safety, able to come to stand from relciner without AD, cues for eccentric control on sit    Ambulation/Gait Ambulation/Gait assistance: Supervision, Min guard Gait Distance (Feet): 250 Feet Assistive device: Rolling walker (2 wheels) Gait Pattern/deviations: Step-through pattern, Decreased stride length Gait velocity: decreased     General Gait Details: guarded gait with RW, distance limited to pt stated [place], pt reporting RW use at baseline on days when he feels more weak   Stairs             Wheelchair Mobility    Modified Rankin (Stroke Patients Only)       Balance Overall balance assessment: Needs assistance Sitting-balance support: No  upper extremity supported, Feet supported Sitting balance-Leahy Scale: Fair Sitting balance - Comments: sitting EOB on arrrival wtih good balance without UE support    Standing balance support: Bilateral upper extremity supported Standing balance-Leahy Scale: Poor Standing balance comment: BUE support                            Cognition Arousal/Alertness: Awake/alert Behavior During Therapy: WFL for tasks assessed/performed Overall Cognitive Status: Within Functional Limits for tasks assessed                                          Exercises      General Comments General comments (skin integrity, edema, etc.): VSS on RA      Pertinent Vitals/Pain Pain Assessment Pain Assessment: No/denies pain Pain Intervention(s): Monitored during session    Home Living                          Prior Function            PT Goals (current goals can now be found in the care plan section) Acute Rehab PT Goals Patient Stated Goal: to go home PT Goal Formulation: Patient unable to participate in goal setting Time For Goal Achievement: 03/09/22    Frequency    Min 3X/week      PT Plan      Co-evaluation              AM-PAC PT "6 Clicks" Mobility   Outcome Measure  Help needed turning from your back to your side while in a flat bed without using bedrails?: A Little Help needed moving from lying on your back to sitting on the side of a flat bed without using bedrails?: A Little Help needed moving to and from a bed to a chair (including a wheelchair)?: A Little Help needed standing up from a chair using your arms (e.g., wheelchair or bedside chair)?: A Little Help needed to walk in hospital room?: A Little Help needed climbing 3-5 steps with a railing? : Total 6 Click Score: 16    End of Session Equipment Utilized During Treatment: Gait belt Activity Tolerance: Patient tolerated treatment well;Patient limited by fatigue Patient left: with call bell/phone within reach;with nursing/sitter in room;in chair Nurse Communication: Mobility status PT Visit Diagnosis: Unsteadiness on feet (R26.81);Other  abnormalities of gait and mobility (R26.89)     Time: 2595-6387 PT Time Calculation (min) (ACUTE ONLY): 24 min  Charges:  $Gait Training: 8-22 mins $Therapeutic Activity: 8-22 mins                    Violetta Lavalle R. PTA Acute Rehabilitation Services Office: [place] 02/26/2022, 12:58 PM

## 2022-02-26 NOTE — Care Management Important Message (Signed)
Important Message  Patient Details  Name: Edward [name] MRN: [id number] Date of Birth: 05/12/1950   Medicare Important Message Given:  Yes     Shelda Altes 02/26/2022, 10:55 AM

## 2022-02-26 NOTE — Progress Notes (Signed)
Mobility Specialist Progress Note:   02/26/22 1230  Therapy Vitals  Pulse Rate 87  Resp 13  BP 99/62  Oxygen Therapy  SpO2 99 %  Mobility  Activity Ambulated with assistance in room;Transferred from chair to bed  Level of Assistance Modified independent, requires aide device or extra time  Assistive Device Front wheel walker  Distance Ambulated (ft) 4 ft  Activity Response Tolerated well  $Mobility charge 1 Mobility   Pt received in chair asking to go back to bed. No complaints of pain. Left in bed with call bell in reach and all needs met.   [facility] Surveyor, mining Chat only

## 2022-02-26 NOTE — Progress Notes (Signed)
Speech Language Pathology Treatment: Dysphagia  Patient Details Name: KHALIEL MOREY MRN: [id number] DOB: Jun 27, 1950 Today's Date: 02/26/2022 Time: 9833-8250 SLP Time Calculation (min) (ACUTE ONLY): 19 min  Assessment / Plan / Recommendation Clinical Impression  Pt seen for ongoing dysphagia management.  Pt would like to resume solid foods.  Today pt tolerated nectar thick liquid with no clinical s/s of aspiration with serial straw sips.  Pt with good tolerance of ice chips and prompt oral response.  With thin liquid by straw there was multiple swallows.  Pt consumed serial straw sips of thin liquid without any overt s/s of aspiration.  Silent penetration observed on FEES.  Pt may benefit from instrumental reassessment to upgrade liquids.  Unclear if pt may have barium at this time for upgrade.  GI with recommendations for clear liquid diet.  Pt was tearful about his prognosis today.  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  He also very eager to resume solid foods.  SLP awaiting GI/MD input prior to advancing diet or scheduling further testing.  Will continue follow for diet tolerance.   Recommend continuing clear liquid diet, nectar thick.  Pt may have ice chips and small sips of water for comfort in between meals, after good oral care, in moderation, when fully awake/alert, with upright positioning and intermittent supervision.    HPI HPI: MALIKE FOGLIO is a 71 y.o. male who presented to [facility] 10/16 with cramping of the hands and feet.  He had had a paracentesis earlier that day with 4 L removed (see below).  While in ED, he began to spit up bright red blood.  This progressed to frank hematemesis, large-volume and persistent.  He was started on Protonix, octreotide, ceftriaxone.  He then became hypotensive; therefore, massive transfusion protocol was initiated.  He received 8 units PRBC, 2 units FFP, 1 unit platelets.  He was  intubated for airway protection was also started on Levophed. Pt with EGD 10/18 with banding x6. CXR 10/18 without focal infiltrate. 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  He was apparently evaluated at [facility] for transplant but was deemed to not be a candidate.      SLP Plan  Continue with current plan of care      Recommendations for follow up therapy are one component of a multi-disciplinary discharge planning process, led by the attending physician.  Recommendations may be updated based on patient status, additional functional criteria and insurance authorization.    Recommendations  Diet recommendations: Nectar-thick liquid Liquids provided via: Cup;Straw Medication Administration: Via alternative means Compensations: Slow rate;Small sips/bites                Follow Up Recommendations:  (Continue ST at next level of care) Assistance recommended at discharge: Intermittent Supervision/Assistance SLP Visit Diagnosis: Dysphagia, pharyngeal phase (R13.13) Plan: Continue with current plan of care           Celedonio Savage, [place], [place] Office: [phone] 02/26/2022, 11:28 AM

## 2022-02-26 NOTE — Progress Notes (Signed)
  Progress Note   Patient: Edward [name] [facility] YTK:[id number] DOB: May 10, 1950 DOA: 02/20/2022     6 DOS: the patient was seen and examined on 02/26/2022   Brief hospital course: 71yo with hx cirrhosis, wilson's disease, known esophageal varices presented with large volume hematemesis and hemodynamic instability. Pt required large volume PRBC transfusion and underwent EGD with banding of varices. Hemodynamics improved and pt transferred to [facility]   Assessment and Plan: Acute blood loss anemia secondary to bleeding esophageal varices -Requiring large volume PRBC transfusion this visit -GI had been following, now s/p banding -Hgb thus far stable, will cont to follow trends -Seen by slp with recs for clears, nectar thick liquids -Overall poor prognosis  Cirrhosis with ascites with Wilson's disease -Had been continued on PPI -did require octreotide gtt, now off -receiving empiric rocephin for SBP prophylaxis -Discussed with GI. Overall poor prognosis. Meld noted to be 38 which correlates to near 50% mortality in the next 3 months, not including high risk of death from UGI bleed alone -Pt is very much aware of his prognosis and verbalized this to staff this AM -Appreciate input by Palliative Care. Decision at this time remains Full Code  Toxic metabolic encephalopathy -continue lactulose as tolerated -Most recent ammonia of 91 on 10/19. Ammonia normalized to 32  Coagulopathy -Likely secondary to liver disease -cont to monior  Acute respiratory failure -On minimal O2 support  ARF on CKD 3b -Cr remains stable -Recheck bmet in AM  CAD -Seems stable at this time  DM, uncontrolled with hyperglycemia -Cont SSI as needed -Glycemic trends stable  Severe protein calorie malnutrition -Seen by dietitian -cleared for clear diet with nectar liquids per SLP -GI had mentioned clear diet in their last note -Discussed with GI today. Per GI, can now advance as tolerated      Subjective:  Without complaints. Eager to start eating again  Physical Exam: Vitals:   02/26/22 1300 02/26/22 1330 02/26/22 1400 02/26/22 1500  BP: (!) 94/58 (!) 91/50 (!) 89/59 (!) 84/61  Pulse: 82  63 61  Resp: 17 13 (!) 21 13  Temp:      TempSrc:      SpO2: 100%  99% 98%  Weight:      Height:       General exam: Awake, laying in bed, in nad Respiratory system: Normal respiratory effort, no wheezing Cardiovascular system: regular rate, s1, s2 Gastrointestinal system: Soft, nondistended, positive BS Central nervous system: CN2-12 grossly intact, strength intact Extremities: Perfused, no clubbing Skin: Normal skin turgor, no notable skin lesions seen Psychiatry: Mood normal // no visual hallucinations   Data Reviewed:  Labs reviewed: Na 136, K 3.4, Cr 2.49, Hgb 8.6   Family Communication: Pt in room, family not at bedside  Disposition: Status is: Inpatient Remains inpatient appropriate because: Severity of illness  Planned Discharge Destination: Skilled nursing facility     Author: Marylu Lund, MD 02/26/2022 3:28 PM  For on call review [email or web address].

## 2022-02-27 ENCOUNTER — Inpatient Hospital Stay (HOSPITAL_COMMUNITY): Payer: 59

## 2022-02-27 DIAGNOSIS — R188 Other ascites: Secondary | ICD-10-CM

## 2022-02-27 DIAGNOSIS — E43 Unspecified severe protein-calorie malnutrition: Secondary | ICD-10-CM | POA: Diagnosis not present

## 2022-02-27 DIAGNOSIS — R578 Other shock: Secondary | ICD-10-CM | POA: Diagnosis not present

## 2022-02-27 DIAGNOSIS — K7460 Unspecified cirrhosis of liver: Secondary | ICD-10-CM

## 2022-02-27 DIAGNOSIS — Z7189 Other specified counseling: Secondary | ICD-10-CM | POA: Diagnosis not present

## 2022-02-27 DIAGNOSIS — K920 Hematemesis: Secondary | ICD-10-CM | POA: Diagnosis not present

## 2022-02-27 DIAGNOSIS — Z515 Encounter for palliative care: Secondary | ICD-10-CM | POA: Diagnosis not present

## 2022-02-27 DIAGNOSIS — D62 Acute posthemorrhagic anemia: Secondary | ICD-10-CM | POA: Diagnosis not present

## 2022-02-27 DIAGNOSIS — I8501 Esophageal varices with bleeding: Secondary | ICD-10-CM | POA: Diagnosis not present

## 2022-02-27 HISTORY — PX: IR PARACENTESIS: IMG2679

## 2022-02-27 LAB — CBC
HCT: 26.4 % — ABNORMAL LOW (ref 39.0–52.0)
Hemoglobin: 8.9 g/dL — ABNORMAL LOW (ref 13.0–17.0)
MCH: 31.3 pg (ref 26.0–34.0)
MCHC: 33.7 g/dL (ref 30.0–36.0)
MCV: 93 fL (ref 80.0–100.0)
Platelets: 44 10*3/uL — ABNORMAL LOW (ref 150–400)
RBC: 2.84 MIL/uL — ABNORMAL LOW (ref 4.22–5.81)
RDW: 15.9 % — ABNORMAL HIGH (ref 11.5–15.5)
WBC: 2.9 10*3/uL — ABNORMAL LOW (ref 4.0–10.5)
nRBC: 0 % (ref 0.0–0.2)

## 2022-02-27 LAB — COMPREHENSIVE METABOLIC PANEL
ALT: 31 U/L (ref 0–44)
AST: 40 U/L (ref 15–41)
Albumin: 2.7 g/dL — ABNORMAL LOW (ref 3.5–5.0)
Alkaline Phosphatase: 43 U/L (ref 38–126)
Anion gap: 7 (ref 5–15)
BUN: 50 mg/dL — ABNORMAL HIGH (ref 8–23)
CO2: 21 mmol/L — ABNORMAL LOW (ref 22–32)
Calcium: 8.6 mg/dL — ABNORMAL LOW (ref 8.9–10.3)
Chloride: 108 mmol/L (ref 98–111)
Creatinine, Ser: 2.17 mg/dL — ABNORMAL HIGH (ref 0.61–1.24)
GFR, Estimated: 32 mL/min — ABNORMAL LOW (ref >60)
Glucose, Bld: 99 mg/dL (ref 70–99)
Potassium: 4.1 mmol/L (ref 3.5–5.1)
Sodium: 136 mmol/L (ref 135–145)
Total Bilirubin: 3 mg/dL — ABNORMAL HIGH (ref 0.3–1.2)
Total Protein: 4.6 g/dL — ABNORMAL LOW (ref 6.5–8.1)

## 2022-02-27 LAB — GRAM STAIN

## 2022-02-27 LAB — GLUCOSE, CAPILLARY
Glucose-Capillary: 86 mg/dL (ref 70–99)
Glucose-Capillary: 144 mg/dL — ABNORMAL HIGH (ref 70–99)
Glucose-Capillary: 305 mg/dL — ABNORMAL HIGH (ref 70–99)
Glucose-Capillary: 175 mg/dL — ABNORMAL HIGH (ref 70–99)

## 2022-02-27 LAB — BODY FLUID CELL COUNT WITH DIFFERENTIAL
Lymphs, Fluid: 70 %
Monocyte-Macrophage-Serous Fluid: 27 % — ABNORMAL LOW (ref 50–90)
Neutrophil Count, Fluid: 3 % (ref 0–25)
Total Nucleated Cell Count, Fluid: 47 cu mm (ref 0–1000)

## 2022-02-27 MED ORDER — LIDOCAINE HCL 1 % IJ SOLN
INTRAMUSCULAR | Status: AC
  Administered 2022-02-27: 10 mL
  Filled 2022-02-27: qty 20

## 2022-02-27 MED ORDER — ENSURE ENLIVE PO LIQD
237.0000 mL | Freq: Three times a day (TID) | ORAL | Status: DC
  Administered 2022-02-27 – 2022-03-01 (×5): 237 mL via ORAL

## 2022-02-27 MED ORDER — ALBUMIN HUMAN 25 % IV SOLN
25.0000 g | Freq: Once | INTRAVENOUS | Status: AC
  Administered 2022-02-27: 25 g via INTRAVENOUS
  Filled 2022-02-27: qty 100

## 2022-02-27 MED ORDER — LACTULOSE 10 GM/15ML PO SOLN
10.0000 g | Freq: Two times a day (BID) | ORAL | Status: DC
  Administered 2022-02-27 – 2022-03-01 (×4): 10 g via ORAL
  Filled 2022-02-27 (×5): qty 15

## 2022-02-27 MED ORDER — ADULT MULTIVITAMIN W/MINERALS CH: 1.0000 | ORAL_TABLET | Freq: Every day | ORAL | Status: DC

## 2022-02-27 NOTE — Progress Notes (Signed)
Mobility Specialist Progress Note   02/27/22 1000  Mobility  Activity Ambulated with assistance in hallway  Level of Assistance Modified independent, requires aide device or extra time  Assistive Device Front wheel walker  Distance Ambulated (ft) 260 ft  Range of Motion/Exercises Active;All extremities  Activity Response Tolerated well   Patient received in supine eager to participate in mobility. Ambulated mod I with slow steady gait. Returned to room without complaint or incident. Was left in supine with all needs met, call bell in reach.   Edward [name], [place], [place]  Office: [phone]

## 2022-02-27 NOTE — Progress Notes (Incomplete)
Patient ID: TREVEN HOLTMAN, male   DOB: 02/08/1951, 71 y.o.   MRN: [id number]    Progress Note from the Palliative Medicine Team at [facility]   Patient Name: [facility]        Date: 02/27/2022 DOB: 16-Apr-1951  Age: 71 y.o. MRN#: [id number] Attending Physician: Donne Hazel, MD Primary Care Physician: [name], FNP Admit Date: 02/20/2022   Medical records reviewed   72 y.o. male  with past medical history of cirrhosis with ascites undergoing serial paracenteses, portal hypertension with paraesophageal and perigastric varices (s/p esophageal banding 02/01/22 after surveillance EGD), concern for Wilson's disease, CAD, DM, HTN, and GERD admitted on 02/20/2022 with cramping in hands and feet.  While in the ED he developed frank hematemesis.  Patient became hypotensive and unresponsive and ultimately required intubation.  GI bleed due to esophageal varices -he had 6 bands during EGD 10/18.  Patient also with AKI, likely hepatorenal syndrome.  Patient remains on ventilator and low-dose vasopressors.  Patient with frequent hospital admissions recently.  PMT consulted to discuss goals of care with family.     This NP assessed patient at the bedside as a follow up for palliative medicine needs and emotional support.    Patient's main complaint is abdominal distention and discomfort.  Discussed with Dr. Laural Roes is for paracentesis.  Ongoing conversation regarding current medical situation.  Patient understands the seriousness of his current medical situation and tells me today that the attending told him his prognosis was 3 to 6 months.  Created space and opportunity for questions to explore his thoughts and feelings regarding his life limiting illness.  He understands however at this time he continues to hope for improvement and eventual discharge home for ongoing quality of life.  Education offered on hospice benefit; philosophy and eligibility       Education offered  today regarding  the importance of continued conversation with family and their  medical providers regarding overall plan of care and treatment options,  ensuring decisions are within the context of the patients values and GOCs.  Questions and concerns addressed   Discussed with Dr Dwain Sarna NP  Palliative Medicine Team Team Phone # [phone] Pager [phone]

## 2022-02-27 NOTE — Progress Notes (Signed)
Physical Therapy Treatment Patient Details Name: PRAKASH KIMBERLING MRN: [id number] DOB: 1951-02-12 Today's Date: 02/27/2022   History of Present Illness ALWALEED OBESO is a 71 y.o. male who presented to [facility] 10/16 with cramping of the hands and feet.  He had had a paracentesis earlier that day with 4 L removed (see below).  While in ED, he began to spit up bright red blood.  This progressed to frank hematemesis, large-volume and persistent.  Pt then became hypotensive; therefore, massive transfusion protocol was initiated.  He received 8 units PRBC, 2 units FFP, 1 unit platelets.  He was intubated 10/17 for airway protection was also started on Levophed. Pt with EGD 10/18 with banding x6. CXR 10/18 without focal infiltrate. Extubated 10/19.  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  He was apparently evaluated at [facility] for transplant but was deemed to not be a candidate.    PT Comments    Pt received supine and agreeable to session with continued progress with session focused on gait for increased activity tolerance with pt mobilizing with rollator and min guard with no LOB, distance limited secondary to flexi-seal leaking. Pt able to maintain static standing at sink without UE support and has progressed with bed mobility and transfers to supervision level for safety. Educated pt re;benefits of continued mobility, activity recommendations and energy conservation with pt verbalizing understanding. Pt continues to benefit from skilled PT services to progress toward functional mobility goals.    Recommendations for follow up therapy are one component of a multi-disciplinary discharge planning process, led by the attending physician.  Recommendations may be updated based on patient status, additional  functional criteria and insurance authorization.  Follow Up Recommendations  Skilled nursing-short term rehab (<3 hours/day) Can patient physically be transported by private vehicle: Yes   Assistance Recommended at Discharge Frequent or constant Supervision/Assistance  Patient can return home with the following Two people to help with walking and/or transfers;A lot of help with bathing/dressing/bathroom;Assistance with cooking/housework;Assist for transportation;Help with stairs or ramp for entrance   Equipment Recommendations  Rolling walker (2 wheels)    Recommendations for Other Services       Precautions / Restrictions Precautions Precautions: Fall Precaution Comments: flexi seal Restrictions Weight Bearing Restrictions: No     Mobility  Bed Mobility Overal bed mobility: Modified Independent             General bed mobility comments: increased time and use of bed rail    Transfers Overall transfer level: Needs assistance Equipment used: Rollator (4 wheels) Transfers: Sit to/from Stand Sit to Stand: Supervision           General transfer comment: pt with good hand placemeent without cues, min guard for safety    Ambulation/Gait Ambulation/Gait assistance: Supervision, Min guard (light assist for lines/flexiseal) Gait Distance (Feet): 250 Feet Assistive device: Rollator (4 wheels) Gait Pattern/deviations: Step-through pattern, Decreased stride length Gait velocity: decreased     General Gait Details: increaed gati speed with rollator, pt with some c/o SOB at end of ambulation, resolving with seated rest   Stairs             Wheelchair Mobility    Modified Rankin (Stroke Patients Only)       Balance Overall balance assessment: Needs assistance Sitting-balance support: No upper extremity supported, Feet supported Sitting balance-Leahy Scale:  Fair Sitting balance - Comments: sitting EOB on arrrival wtih good balance without UE support    Standing balance support: Bilateral upper extremity supported Standing balance-Leahy Scale: Fair Standing balance comment: able to static stand withou UE support                            Cognition Arousal/Alertness: Awake/alert Behavior During Therapy: WFL for tasks assessed/performed Overall Cognitive Status: Within Functional Limits for tasks assessed                                          Exercises General Exercises - Lower Extremity Straight Leg Raises: AROM, Right, Left, 5 reps, Supine    General Comments General comments (skin integrity, edema, etc.): VSS on RA      Pertinent Vitals/Pain Pain Assessment Pain Assessment: No/denies pain Pain Intervention(s): Monitored during session    Home Living                          Prior Function            PT Goals (current goals can now be found in the care plan section) Acute Rehab PT Goals Patient Stated Goal: to go home PT Goal Formulation: Patient unable to participate in goal setting Time For Goal Achievement: 03/09/22    Frequency    Min 3X/week      PT Plan      Co-evaluation              AM-PAC PT "6 Clicks" Mobility   Outcome Measure  Help needed turning from your back to your side while in a flat bed without using bedrails?: A Little Help needed moving from lying on your back to sitting on the side of a flat bed without using bedrails?: A Little Help needed moving to and from a bed to a chair (including a wheelchair)?: A Little Help needed standing up from a chair using your arms (e.g., wheelchair or bedside chair)?: A Little Help needed to walk in hospital room?: A Little Help needed climbing 3-5 steps with a railing? : Total 6 Click Score: 16    End of Session   Activity Tolerance: Patient tolerated treatment well;Patient limited by fatigue Patient left: with nursing/sitter in room;in bed;with call bell/phone within reach Nurse Communication:  Mobility status PT Visit Diagnosis: Unsteadiness on feet (R26.81);Other abnormalities of gait and mobility (R26.89)     Time: 1355-1415 PT Time Calculation (min) (ACUTE ONLY): 20 min  Charges:  $Therapeutic Exercise: 8-22 mins                     Sparsh Callens R. PTA Acute Rehabilitation Services Office: [place] 02/27/2022, 2:26 PM

## 2022-02-27 NOTE — Progress Notes (Signed)
Modified Barium Swallow Progress Note  Patient Details  Name: Edward [name] MRN: [id number] Date of Birth: [birth year]  Today's Date: 02/27/2022  Modified Barium Swallow completed.  Full report located under Chart Review in the Imaging Section.  Brief recommendations include the following:  Clinical Impression  Pt presents with a mild oropharyngeal dysphagia c/b delayed swallow initiation, reduced base of tongue retraction, incomplete laryngeal closure, and diminished sensation.  These deficits resulted in penetration of thin liquid during the swallow.  Penetration was silent, but did fully clear with subsequent swallows.  There was moderate vallecula residue with all consistencies which was also reduced with subequent swallows.  With pill simulation, there was brief stasis of tablet in vallecula.  There was esophageal stasis with table which was not fully cleared by liquid wash, but did clear with puree bolus.    Recommend advancing pt to regular texture solids as medically appropriate.  Alternate liquids and solids as needed to reduce residue and to assist in clearance of penetration.   Swallow Evaluation Recommendations       SLP Diet Recommendations: Thin liquid;Regular solids   Liquid Administration via: Straw   Medication Administration: Whole meds with liquid (or puree)   Supervision: Intermittent supervision to cue for compensatory strategies   Compensations: Slow rate;Small sips/bites;Follow solids with liquid       Oral Care Recommendations: Oral care BID        Celedonio Savage, [place], [place] Office: [phone] 02/27/2022,1:18 PM

## 2022-02-27 NOTE — Procedures (Signed)
PROCEDURE SUMMARY:  Successful ultrasound guided paracentesis from the right lower quadrant.  Yielded 5.4 L of clear yellow fluid.  No immediate complications.  The patient tolerated the procedure well.   Specimen sent for labs.  EBL < 2 mL  The patient has previously been evaluated by the [facility], and deemed not a candidate for intervention.    Soyla Dryer,  [phone] 02/27/2022, 2:04 PM

## 2022-02-27 NOTE — Progress Notes (Signed)
  Progress Note   Patient: Edward [name] OXB:[id number] DOB: Sep 25, 1950 DOA: 02/20/2022     7 DOS: the patient was seen and examined on 02/27/2022   Brief hospital course: 71yo with hx cirrhosis, wilson's disease, known esophageal varices presented with large volume hematemesis and hemodynamic instability. Pt required large volume PRBC transfusion and underwent EGD with banding of varices. Hemodynamics improved and pt transferred to [facility]   Assessment and Plan: Acute blood loss anemia secondary to bleeding esophageal varices -Requiring large volume PRBC transfusion this visit -GI had been following, now s/p banding -Hgb thus far stable, will cont to follow trends -Seen by slp with recs for clears, nectar thick liquids -Overall poor prognosis -Hgb remains stable thus far  Cirrhosis with ascites with Wilson's disease -Had been continued on PPI -did require octreotide gtt, now off -receiving empiric rocephin for SBP prophylaxis -Discussed with GI. Overall poor prognosis. Meld noted to be 14 which correlates to near 50% mortality in the next 3 months, not including high risk of death from UGI bleed alone -Appreciate input by Palliative Care. GOC remains in progress -Recently discussed with GI, OK to resume regular diet. Pt passed SLP eval. Cautiously advance diet as tolerated -Increased abd distension. Performed US paracentesis  yielding 5.4L fluid. Total cells 176 with 1% neutrophils, ruling out SBP  Toxic metabolic encephalopathy -Most recent ammonia normalized to 32 with normal mentation -Will decrease lactulose to 10g bid -Anticipate d/c rectal tube as stool output slows  Coagulopathy -Likely secondary to liver disease -cont to monior -No evidence of acute blood loss  Acute respiratory failure -On minimal O2 support  ARF on CKD 3b -Cr slowly improving -Recheck bmet in AM  CAD -Seems stable at this time  DM, uncontrolled with hyperglycemia -Cont SSI as needed -Glycemic  trends stable  Severe protein calorie malnutrition -Seen by dietitian -cleared for clear diet with nectar liquids per SLP - Per GI, can now advance as tolerated per above      Subjective: Feeling better today. Eager to advance diet, thus far tolerating clear liquid. Is complaining of increased abd distension and discomfort, asking for paracentesis.  Physical Exam: Vitals:   02/27/22 0255 02/27/22 0801 02/27/22 1037 02/27/22 1615  BP: (!) 107/54 111/60 112/65 (!) 97/51  Pulse: 63 64 65 60  Resp: '14 15 15 17  '$ Temp: 97.6 F (36.4 C) (!) 97.5 F (36.4 C) (!) 97.3 F (36.3 C) (!) 97.3 F (36.3 C)  TempSrc: Oral Oral Oral Oral  SpO2: 97% 96% 96% 100%  Weight:      Height:       General exam: Conversant, in no acute distress Respiratory system: normal chest rise, clear, no audible wheezing Cardiovascular system: regular rhythm, s1-s2 Gastrointestinal system: distended, nontender, pos BS Central nervous system: No seizures, no tremors Extremities: No cyanosis, no joint deformities Skin: No rashes, no pallor Psychiatry: Affect normal // no auditory hallucinations  Data Reviewed:  Labs reviewed: Na 136, K 4.1, Cr 2.17, Hgb 8.9   Family Communication: Pt in room, family not at bedside  Disposition: Status is: Inpatient Remains inpatient appropriate because: Severity of illness  Planned Discharge Destination: Skilled nursing facility     Author: Marylu Lund, MD 02/27/2022 4:21 PM  For on call review [email or web address].

## 2022-02-27 NOTE — Progress Notes (Addendum)
Nutrition Follow-up  DOCUMENTATION CODES:   Severe malnutrition in context of chronic illness  INTERVENTION:  Provide Ensure Enlive po TID, each supplement provides 350 kcal and 20 grams of protein.  Encouraged adequate intake of calories and protein as tolerated with diet advancement.  NUTRITION DIAGNOSIS:   Severe Malnutrition related to chronic illness (cirrhosis) as evidenced by moderate fat depletion, severe muscle depletion, percent weight loss (24.6% weight loss in less than 3 months).  Ongoing.  GOAL:   Patient will meet greater than or equal to 90% of their needs  Progressing.  MONITOR:   Vent status, Labs, Weight trends, Skin, I & O's  REASON FOR ASSESSMENT:   Ventilator    ASSESSMENT:   71 year old male who presented to the ED on 10/16. PMH of CAD, CKD stage IIIb, Wilson's disease, cirrhosis with ascites requiring frequent paracenteses, T2DM. Pt with sudden onset hematemesis in the ED and required intubation. Pt admitted with large volume upper GI bleed, hemorrhagic shock.  10/17 - s/p EGD showing actively bleeding esophageal varices s/p 6 bands 10/19 - Extubated 10/20 - Underwent FEES; diet advanced to clear liquid/nectar thick consistency 10/24 - s/p US guided paracentesis from right lower quadrant that yielded 5.4 L of clear yellow fluid; underwent MBS study and approved for thin liquids and advancement up to regular solids (deferring advancement to MD); diet advanced to full liquids by MD  Met with pt at bedside. He is excited diet has been advanced to full liquids and he is able to receive thin liquids. At time of RD assessment he was drinking chocolate milk and also had Ensure on tray table he was going to try. Pt reports good appetite. He reports he was not always able to finish all of the clear liquid/nectar thick trays due to taste of some of the items. Reports it was not related to hunger. He is looking forward to further diet advancement as tolerated but  wants to take it slow today. Patient reports he drinks Ensure 1-2 bottles daily at home and is amenable to RD ordering for pt to drink here. Encouraged intake of 3 bottles daily.   Current wt is 72.7 kg (160.27 lbs) measured 02/25/22. Weights fluctuating 71.3-73.1 kg since admission. Suspect true wt lower now that pt s/p paracentesis today.  UOP: 350 mL (0.2 mL/kg/hr)  I/O: +5,637.4 mL since admission  Medications reviewed and include: Novolog 0-9 units TID, Insulin glargine 10 units daily, lactulose 10 grams BID, pantoprazole.  Labs reviewed: CBG 86-193 past 24 hrs, BUN 50 (trending down), Creatinine 2.17 (trending down), Total bilirubin 3. Ceruloplasmin: 12.2 L 08/25/21 Copper: 57 L 11/18/21 Zinc 75 08/25/21 24Hr Urinary Copper: 72 H 09/12/21 RD noted history of low copper. Pt with Wilson's disease per MD note. Management for Wilson's disease may include copper chelation and low copper diet.  Discussed with SLP and MD via secure chat 10/23 and 10/24. GI had signed off, but when MD reached out to GI, okay for diet advancement as pt s/p banding. Diet advanced to full liquids today.  Noted MELD score 30, which correlates to 50% mortality in the next 3 months. Pt being followed by Palliative Care.  Diet Order:   Diet Order             Diet full liquid Room service appropriate? Yes; Fluid consistency: Thin  Diet effective now                   EDUCATION NEEDS:   No education needs have  been identified at this time  Skin:  Skin Assessment: Skin Integrity Issues: Skin Integrity Issues:: Stage I Stage I: coccyx, left heel  Last BM:  02/27/22 type 7  Height:   Ht Readings from Last 1 Encounters:  02/19/22 5' 10"  (1.778 m)    Weight:   Wt Readings from Last 1 Encounters:  02/25/22 72.7 kg   BMI:  Body mass index is 23 kg/m.  Estimated Nutritional Needs:   Kcal:  1800-2000  Protein:  95-105 grams  Fluid:  >1.8 L  Loanne Drilling, MS, RD, LDN, CNSC Pager number  available on Amion

## 2022-02-28 DIAGNOSIS — K7210 Chronic hepatic failure without coma: Secondary | ICD-10-CM

## 2022-02-28 DIAGNOSIS — K254 Chronic or unspecified gastric ulcer with hemorrhage: Secondary | ICD-10-CM

## 2022-02-28 DIAGNOSIS — K7031 Alcoholic cirrhosis of liver with ascites: Secondary | ICD-10-CM

## 2022-02-28 LAB — COMPREHENSIVE METABOLIC PANEL
ALT: 32 U/L (ref 0–44)
AST: 44 U/L — ABNORMAL HIGH (ref 15–41)
Albumin: 2.9 g/dL — ABNORMAL LOW (ref 3.5–5.0)
Alkaline Phosphatase: 42 U/L (ref 38–126)
Anion gap: 10 (ref 5–15)
BUN: 39 mg/dL — ABNORMAL HIGH (ref 8–23)
CO2: 19 mmol/L — ABNORMAL LOW (ref 22–32)
Calcium: 8.8 mg/dL — ABNORMAL LOW (ref 8.9–10.3)
Chloride: 108 mmol/L (ref 98–111)
Creatinine, Ser: 2.04 mg/dL — ABNORMAL HIGH (ref 0.61–1.24)
GFR, Estimated: 35 mL/min — ABNORMAL LOW (ref >60)
Glucose, Bld: 128 mg/dL — ABNORMAL HIGH (ref 70–99)
Potassium: 3.9 mmol/L (ref 3.5–5.1)
Sodium: 137 mmol/L (ref 135–145)
Total Bilirubin: 2.8 mg/dL — ABNORMAL HIGH (ref 0.3–1.2)
Total Protein: 4.9 g/dL — ABNORMAL LOW (ref 6.5–8.1)

## 2022-02-28 LAB — CBC
HCT: 26.5 % — ABNORMAL LOW (ref 39.0–52.0)
Hemoglobin: 9.2 g/dL — ABNORMAL LOW (ref 13.0–17.0)
MCH: 32.5 pg (ref 26.0–34.0)
MCHC: 34.7 g/dL (ref 30.0–36.0)
MCV: 93.6 fL (ref 80.0–100.0)
Platelets: 41 10*3/uL — ABNORMAL LOW (ref 150–400)
RBC: 2.83 MIL/uL — ABNORMAL LOW (ref 4.22–5.81)
RDW: 16.5 % — ABNORMAL HIGH (ref 11.5–15.5)
WBC: 3.1 10*3/uL — ABNORMAL LOW (ref 4.0–10.5)
nRBC: 0 % (ref 0.0–0.2)

## 2022-02-28 LAB — GLUCOSE, CAPILLARY
Glucose-Capillary: 154 mg/dL — ABNORMAL HIGH (ref 70–99)
Glucose-Capillary: 247 mg/dL — ABNORMAL HIGH (ref 70–99)
Glucose-Capillary: 231 mg/dL — ABNORMAL HIGH (ref 70–99)
Glucose-Capillary: 256 mg/dL — ABNORMAL HIGH (ref 70–99)

## 2022-02-28 MED ORDER — PANTOPRAZOLE SODIUM 40 MG PO TBEC
40.0000 mg | DELAYED_RELEASE_TABLET | Freq: Two times a day (BID) | ORAL | Status: DC
  Administered 2022-02-28 – 2022-03-01 (×3): 40 mg via ORAL
  Filled 2022-02-28 (×3): qty 1

## 2022-02-28 NOTE — Progress Notes (Signed)
Inpatient Diabetes Program Recommendations  AACE/ADA: New Consensus Statement on Inpatient Glycemic Control (2015)  Target Ranges:  Prepandial:   less than 140 mg/dL      Peak postprandial:   less than 180 mg/dL (1-2 hours)      Critically ill patients:  140 - 180 mg/dL   Lab Results  Component Value Date   GLUCAP 247 (H) 02/28/2022   HGBA1C 6.5 (H) 11/27/2021    Review of Glycemic Control  Latest Reference Range & Units 02/27/22 11:13 02/27/22 16:19 02/27/22 21:23 02/28/22 05:58 02/28/22 11:31  Glucose-Capillary 70 - 99 mg/dL 144 (H) 305 (H) 175 (H) 154 (H) 247 (H)   Diabetes history: DM 2 Outpatient Diabetes medications:  Tradjenta 5 mg daily  Current orders for Inpatient glycemic control:  Novolog sensitive (0-9 units) tid with meals and HS Semglee 10 units daily  Inpatient Diabetes Program Recommendations:    May consider adding Novolog 2 units tid with meals (hold if patient eats less than 50% or NPO).   Thanks,  Adah Perl, RN, BC-ADM Inpatient Diabetes Coordinator Pager [phone]  (8a-5p)

## 2022-02-28 NOTE — Progress Notes (Signed)
Mobility Specialist Progress Note:   02/28/22 1100  Mobility  Activity Ambulated with assistance in hallway  Level of Assistance Standby assist, set-up cues, supervision of patient - no hands on  Assistive Device Four wheel walker  Distance Ambulated (ft) 200 ft  Activity Response Tolerated well  $Mobility charge 1 Mobility   Pre- Mobility: 121 HR During Mobility: 140 HR Post Mobility:   104 HR  Pt received in bed willing to participate in mobility. No complaints of pain. Left in chair with clal bell in reach and all needs met.   [facility] Surveyor, mining Chat only

## 2022-02-28 NOTE — Progress Notes (Signed)
Progress Note   Patient: Edward [name] [facility] TFT:[id number] DOB: 11/29/1950 DOA: 02/20/2022     8 DOS: the patient was seen and examined on 02/28/2022   Brief hospital course: 71yo with hx cirrhosis, wilson's disease, known esophageal varices presented with large volume hematemesis and hemodynamic instability. Pt required large volume PRBC transfusion and underwent EGD with banding of varices. Hemodynamics improved and pt transferred to [facility] He has an overall poor prognosis and GI has seen the patient and has recommended palliative care With him   Assessment and Plan: Acute blood loss anemia secondary to bleeding esophageal varices -Requiring large volume PRBC transfusion this visit -GI had been following, now s/p banding -Hgb thus far stable, will cont to follow trends -Seen by slp with recs for clears, nectar thick liquids -Overall poor prognosis -Hgb remains stable thus far  Cirrhosis with ascites with Wilson's disease---diagnosis is in dispute as unclear where they did genetic testing for this I will investigate -Had been continued on PPI -did require octreotide gtt, now off--on midodrine 20 3 times daily for blood pressure support -receiving empiric rocephin for SBP prophylaxis -Discussed with GI. Overall poor prognosis. Meld noted to be 16 which correlates to near 50% mortality in the next 3 months, not including high risk of death from UGI bleed alone -Await consolidation goals of care family wants everything done--- -Recently discussed with GI,/SLP cautiously advance diet as tolerated -Increased abd distension.  Paracentesis 10/24 = yielding 5.4L fluid. Total cells 176 with 1% neutrophils, ruling out SBP  Toxic metabolic encephalopathy -Most recent ammonia normalized to 32 with normal mentation -Lower dose lactulose to 10g bid, continues rifaximin 200 3 times daily -Anticipate d/c rectal tube as stool output slows  Coagulopathy -Likely secondary to liver disease -cont to  monior -No evidence of acute blood loss  Acute respiratory failure -On minimal O2 support  ARF on CKD 3b-baseline creatinine looks like it was 1.7 -Cr slowly improving -Recheck bmet in AM -Does have Foley catheter and is on Flomax--trial of voiding trial  CAD -Seems stable at this time  DM, uncontrolled with hyperglycemia -Cont SSI as needed -Glycemic trends 2 30-1 50 eating about 50% of meals will need to adjust if continues the same  Severe protein calorie malnutrition -Seen by dietitian - advancing to soft diet      Subjective:  Having second meal today looks comfortable no further bleeding no chest pain Family concerned about diagnosis of Wilson's disease asking where that was diagnosed He feels a little full in his abdomen and was getting paracentesis in [place] He has no chest pain now   Physical Exam: Vitals:   02/28/22 0600 02/28/22 0734 02/28/22 1140 02/28/22 1637  BP:  105/62 121/69 105/62  Pulse:  93 94 65  Resp: '17 20 20 14  '$ Temp:  97.8 F (36.6 C)  (!) 97.5 F (36.4 C)  TempSrc:  Oral  Oral  SpO2: 100% 100% 100% 100%  Weight:      Height:       EOMI NCAT no focal deficit no icterus quite frail bump in the middle of the head chest clear no added sound abdomen distended with fluid thrill No lower extremity edema  Labs reviewed: Na 137 potassium 3.9 BUN/creatinine 39/2.04, AST ALT 44/32 bilirubin 2.8 WBC 3.1 hemoglobin 9.2 platelet 41   Family Communication: Discussed with 4 family members in the room  Disposition: Status is: Inpatient Remains inpatient appropriate because: Severity of illness  Planned Discharge Destination: Skilled nursing facility  Author: Nita Sells, MD 02/28/2022 6:14 PM  For on call review [email or web address].

## 2022-02-28 NOTE — Plan of Care (Signed)

## 2022-03-01 ENCOUNTER — Other Ambulatory Visit (HOSPITAL_COMMUNITY): Payer: Self-pay

## 2022-03-01 LAB — CBC
HCT: 28.2 % — ABNORMAL LOW (ref 39.0–52.0)
Hemoglobin: 9.5 g/dL — ABNORMAL LOW (ref 13.0–17.0)
MCH: 32.1 pg (ref 26.0–34.0)
MCHC: 33.7 g/dL (ref 30.0–36.0)
MCV: 95.3 fL (ref 80.0–100.0)
Platelets: 50 10*3/uL — ABNORMAL LOW (ref 150–400)
RBC: 2.96 MIL/uL — ABNORMAL LOW (ref 4.22–5.81)
RDW: 17.2 % — ABNORMAL HIGH (ref 11.5–15.5)
WBC: 5 10*3/uL (ref 4.0–10.5)
nRBC: 0 % (ref 0.0–0.2)

## 2022-03-01 LAB — GLUCOSE, CAPILLARY
Glucose-Capillary: 182 mg/dL — ABNORMAL HIGH (ref 70–99)
Glucose-Capillary: 310 mg/dL — ABNORMAL HIGH (ref 70–99)

## 2022-03-01 LAB — COMPREHENSIVE METABOLIC PANEL
ALT: 41 U/L (ref 0–44)
AST: 53 U/L — ABNORMAL HIGH (ref 15–41)
Albumin: 2.8 g/dL — ABNORMAL LOW (ref 3.5–5.0)
Alkaline Phosphatase: 63 U/L (ref 38–126)
Anion gap: 8 (ref 5–15)
BUN: 40 mg/dL — ABNORMAL HIGH (ref 8–23)
CO2: 20 mmol/L — ABNORMAL LOW (ref 22–32)
Calcium: 8.5 mg/dL — ABNORMAL LOW (ref 8.9–10.3)
Chloride: 103 mmol/L (ref 98–111)
Creatinine, Ser: 2.26 mg/dL — ABNORMAL HIGH (ref 0.61–1.24)
GFR, Estimated: 31 mL/min — ABNORMAL LOW (ref >60)
Glucose, Bld: 220 mg/dL — ABNORMAL HIGH (ref 70–99)
Potassium: 4.1 mmol/L (ref 3.5–5.1)
Sodium: 131 mmol/L — ABNORMAL LOW (ref 135–145)
Total Bilirubin: 1.7 mg/dL — ABNORMAL HIGH (ref 0.3–1.2)
Total Protein: 4.7 g/dL — ABNORMAL LOW (ref 6.5–8.1)

## 2022-03-01 LAB — PROTIME-INR
INR: 1.8 — ABNORMAL HIGH (ref 0.8–1.2)
Prothrombin Time: 20.8 seconds — ABNORMAL HIGH (ref 11.4–15.2)

## 2022-03-01 MED ORDER — RIFAXIMIN 200 MG PO TABS
200.0000 mg | ORAL_TABLET | Freq: Three times a day (TID) | ORAL | 1 refills | Status: DC
  Filled 2022-03-01: qty 90, 30d supply, fill #0

## 2022-03-01 MED ORDER — RIFAXIMIN 200 MG PO TABS: 200.0000 mg | ORAL_TABLET | Freq: Three times a day (TID) | ORAL | 1 refills | Status: DC

## 2022-03-01 NOTE — Progress Notes (Signed)
Patient ID: Edward [name], male   DOB: 05/26/1950, 71 y.o.   MRN: [id number]    Progress Note from the Palliative Medicine Team at [name]   Patient Name: Edward [name] Edward [name]        Date: 03/01/2022 DOB: [birth year]  Age: 71 y.o. MRN#: [id number] Attending Physician: Nita Sells, MD Primary Care Physician: [name], FNP Admit Date: 02/20/2022   Medical records reviewed   71 y.o. male  with past medical history of cirrhosis with ascites undergoing serial paracenteses, portal hypertension with paraesophageal and perigastric varices (s/p esophageal banding 02/01/22 after surveillance EGD), concern for Wilson's disease, CAD, DM, HTN, and GERD admitted on 02/20/2022 with cramping in hands and feet.  While in the ED he developed frank hematemesis.  Patient became hypotensive and unresponsive and ultimately required intubation.  GI bleed due to esophageal varices -he had 6 bands during EGD 10/18.  Patient also with AKI, likely hepatorenal syndrome.  Patient remains on ventilator and low-dose vasopressors.  Patient with frequent [name] admissions recently.     This NP assessed patient at the bedside as a follow up for palliative medicine needs and emotional support.    Patient is alert and oriented, family is gathered at bedside for continued conversation regarding current medical situation and treatment plan. Patient's wife, son and several other support people  Ongoing conversation regarding current medical situation.  Education offered on the natural trajectory of cirrhosis and specifically his complication of bleeding esophageal varices  Patient and family understands the seriousness of his current medical situation, and the expected poor prognosis.  The difference between an  aggressive medical intervention path and a palliative comfort path for this patient at this time in this situation was detailed.   Education offered on hospice benefit; philosophy and  eligibility  Plan of care -Full code      -Educated patient/family to consider DNR/DNI status understanding evidenced based poor outcomes in similar hospitalized patient, as the cause of arrest is likely associated with advanced chronic illness rather than an easily reversible acute cardio-pulmonary event. -Patient is open to all offered and available medical interventions to prolong life he plans to follow-up outpatient with liver specialist.  Education offered regarding the importance of continued conversation with his medical providers and his family support system as he navigates into the future his current medical situation, ensuring that his values and goals of care Is centered decision making.   Questions and concerns addressed   Discussed with attending team  Patient and family are encouraged to call with questions or concerns.  Total time spent on the unit was 50 minutes   Wadie Lessen NP  Palliative Medicine Team Team Phone # [phone] Pager [phone]

## 2022-03-01 NOTE — Care Management Important Message (Signed)
Important Message  Patient Details  Name: Edward [name] MRN: [id number] Date of Birth: 01/18/1951   Medicare Important Message Given:  Yes     Shelda Altes 03/01/2022, 9:24 AM

## 2022-03-01 NOTE — Progress Notes (Signed)
Mobility Specialist Progress Note:   03/01/22 0916  Mobility  Activity Ambulated with assistance in hallway  Level of Assistance Standby assist, set-up cues, supervision of patient - no hands on  Assistive Device Front wheel walker  Distance Ambulated (ft) 300 ft  Activity Response Tolerated well  $Mobility charge 1 Mobility   Pt received in bed willing to participate in mobility. No complaints of pain. Left in chair with call bell in reach and all needs met.   [facility] Surveyor, mining Chat only

## 2022-03-01 NOTE — TOC Transition Note (Signed)
Transition of Care (TOC) - CM/SW Discharge Note Marvetta Gibbons RN, BSN Transitions of Care Unit 4E- RN Case Manager See Treatment Team for direct phone #    Patient Details  Name: Edward [name] MRN: [id number] Date of Birth: 02-02-1951  Transition of Care [facility]) CM/SW Contact:  Dawayne Patricia, RN Phone Number: 03/01/2022, 2:41 PM   Clinical Narrative:    Pt stable for transition home today, Orders place for HHPT/aide.  CM to bedside to discuss transition needs with pt.  Per pt he does not feel like he needs HH f/u- pt declining HH services at this time- discussed with pt should he change his mind post discharge he will need to f/u with his primary care provider for new orders and referral-list provided to pt Per CMS guidelines from [email or web address] website with star ratings (copy placed in shadow chart).  Pt voiced understanding, Per pt he has needed DME at home- RW, son  to transport home.   RNCM will sign off for now as intervention is no longer needed. Please re-consult  if new needs arise, or contact RNCM assigned to treatment team for further questions/concerns.      Final next level of care: Home/Self Care Barriers to Discharge: Barriers Resolved   Patient Goals and CMS Choice Patient states their goals for this hospitalization and ongoing recovery are:: Return home CMS Medicare.gov Compare Post Acute Care list provided to:: Patient Represenative (must comment) Choice offered to / list presented to : Adult Children  Discharge Placement               Home         Discharge Plan and Services In-house Referral: Clinical Social Work Discharge Planning Services: CM Consult Post Acute Care Choice: Home Health          DME Arranged: N/A DME Agency: NA       HH Arranged: PT, Nurse's Aide, Patient Refused HH, Refused SNF [place] Agency: NA        Social Determinants of Health (SDOH) Interventions     Readmission Risk Interventions    02/24/2022    11:43 AM 11/28/2021    4:08 PM 11/22/2021    2:53 PM  Readmission Risk Prevention Plan  Transportation Screening Complete Complete Complete  PCP or Specialist Appt within 3-5 Days  Not Complete   Home Care Screening   Patient refused  Medication Review (RN CM)   Complete  HRI or Home Care Consult  Complete   Social Work Consult for [place] Planning/Counseling  Complete   Palliative Care Screening  Complete   Medication Review Press photographer) Complete Complete   PCP or Specialist appointment within 3-5 days of discharge Complete    HRI or [place] Complete    SW Recovery Care/Counseling Consult Complete    Palliative Care Screening Complete    [facility] Patient Refused

## 2022-03-01 NOTE — Discharge Summary (Addendum)
Physician Discharge Summary  [name] BDZ:[id number] DOB: 1951/04/21 DOA: 02/20/2022  PCP: [name], FNP  Admit date: 02/20/2022 Discharge date: 03/01/2022  Time spent: 42 minutes  Recommendations for Outpatient Follow-up:  Needs CBC Chem-12 INR in about 1 week Cautious use of Lasix 40 mg for ascitic fluid accumulation-needs to get back on schedule with intermittent paracentesis--- CC Dr. Hurshel Keys as was supposed to have an endoscopy on 03/16/2022 at 10 AM and may need coordination of care with tertiary care center regarding ascites Recommend outpatient discussion with palliative care and consolidation of this if not a candidate for transplant or other Watch renal function and use of some of the meds including diabetes meds etc.-needs follow-up  Discharge Diagnoses:  MAIN problem for hospitalization   Massive GI bleed secondary to varices requiring large-volume PRBC transfusions and banding by GI Toxic metabolic encephalopathy secondary to cirrhosis Coagulopathy on admission AKI superimposed on CKD 3B DM TY 2 on orals at home Stable CAD  Please see below for itemized issues addressed in HOpsital- refer to other progress notes for clarity if needed  Discharge Condition: Fair  Diet recommendation: Low-salt sodium restricted less than 2 g heart healthy  Filed Weights   02/23/22 0500 02/24/22 0347 02/25/22 0300  Weight: 71.3 kg 72.6 kg 72.7 kg    History of present illness:  71 year old white male Known liver cirrhosis of cryptogenic cause with resulting pancytopenia on chronic every 5 daily paracentesis DM TY 2 CKD 3B with creatinine ranging between 1.6 and 1.9 CAD with PCI in 1990  He had been referred previously from an inpatient admission at [facility] 724 through 729 when he was first diagnosed with lower extremity swelling and cirrhosis of unclear etiology-he went to to the hepatology service at [facility] 12/02/2021--?  Hepatorenal syndrome  etc. there was question of Wilson's disease given abnormal copper testing--- he had negative ANA mitochondrial antibodies anti-smooth muscle IgG IgA IgM and negative viral hepatitis panels but has never had a liver biopsy Because of improved MELD score and lack of transportation they declined putting him on the transplant service and recommended he follow closely with hepatology at [facility] hepatology and family was given information about the same  He had esophageal banding for 3 esophageal varices 02/01/2022 after surveillance EGD Presented to [facility] ED 10/16 with cramping of legs and feet after having 4 L of fluid removed He started to have bright red blood and frank hematemesis-transferred to the ICU at [facility] given Protonix gtt. octreotide gtt. became hypotensive and was transfused 8 units PRBC 2 units FFP and 1 unit platelets and intubated on 10/17 Eventually he stabilized on 10/19 and was extubated  Palliative care was consulted and he is a full code He is stabilized and is now able to tolerate diet Last paracentesis was 10/24 with 5.4 L of clear fluid   Hospital Course:  Large-volume blood loss anemia with bleeding esophageal varices EGD 10/17 grade 3 varices actively bleeding-6 mm largest fibrin plugs placed on distal varix 6 bands with complete eradication of distal esophageal varices and incomplete eradication of mid esophageal varices bleeding stopped and the procedure Transfused as per above note, was on octreotide, empiric Rocephin for SBP prophylaxis -His hemoglobin is stable he is tolerating a diet he will go home on Protonix 40 twice daily and will need repeat scope probably in the next 2 to 3 weeks he has an appointment on 02/14/2022 and I will forward my note to Dr. Abbey Chatters with  regards to setting things up  Decompensated cirrhosis with hepatic encephalopathy pancytopenia cryptogenic cause Lasix Aldactone initially held given rising creatinine-I have resumed  cautiously Lasix 40 mg on discharge He should not resume any aspirin or take any meds other than Tylenol and discussed this with the physician MELD score is elevated to about 30 which is 50% mortality-he has been declined at [facility] hepatology for above reasons-he will need to follow-up with [facility] Had paracentesis 10/24 showing 5.4 L which was ruling out SBP  Toxic metabolic encephalopathy mentation improved--secondary probably to massive blood loss and hepatic encephalopathy Placing back on lactulose 15 twice daily Rifaximin was contemplated but cost over thousand dollars and cannot be affordable to patient-we will need follow-up in the outpatient setting with GI regarding other options  Acute respiratory failure requiring intubation for safety and bleeding Resolved  AKI superimposed on CKD 3 AA Basic metabolic panel shows some resolution but this will need to be watched  DM TY 2 blood sugars ranging between 150 and 230 eating about 50% of meals transferring back over to her linagliptin  Protein energy malnutrition-tolerating soft diet   Procedures: As above   Consultations:  GI ICU  Discharge Exam: Vitals:   03/01/22 0600 03/01/22 0805  BP:  103/63  Pulse: 73 88  Resp: 14 18  Temp:  97.9 F (36.6 C)  SpO2: 100% 100%    Subj on day of d/c   Awake coherent no distress no icterus no pallor Neck soft supple seems quite frail chest is clear no rales Abdomen is distended with some shifting dullness ROM is intact no focal deficit no asterixis No lower extremity edema   Discharge Instructions    Allergies as of 03/01/2022   No Known Allergies      Medication List     STOP taking these medications    spironolactone 25 MG tablet Commonly known as: ALDACTONE       TAKE these medications    albuterol 108 (90 Base) MCG/ACT inhaler Commonly known as: VENTOLIN HFA Inhale 1-2 puffs into the lungs every 6 (six) hours as needed for wheezing or shortness of  breath.   brimonidine 0.2 % ophthalmic solution Commonly known as: ALPHAGAN Place 1 drop into both eyes 2 (two) times daily.   furosemide 40 MG tablet Commonly known as: Lasix Take 1 tablet (40 mg total) by mouth daily.   lactulose 10 GM/15ML solution Commonly known as: CHRONULAC Take 15 mLs (10 g total) by mouth 2 (two) times daily.   linagliptin 5 MG Tabs tablet Commonly known as: TRADJENTA Take 1 tablet (5 mg total) by mouth daily.   midodrine 10 MG tablet Commonly known as: PROAMATINE Take 2 tablets (20 mg total) by mouth 3 (three) times daily with meals.   nitroGLYCERIN 0.4 MG SL tablet Commonly known as: NITROSTAT Place 1 tablet (0.4 mg total) under the tongue every 5 (five) minutes x 3 doses as needed for chest pain (if no relief after 2nd dose, proceed to ED or call 911).   pantoprazole 40 MG tablet Commonly known as: PROTONIX Take 1 tablet (40 mg total) by mouth 2 (two) times daily.   potassium chloride 10 MEQ tablet Commonly known as: KLOR-CON Take 10 mEq by mouth daily.   sodium bicarbonate 650 MG tablet Take 1 tablet (650 mg total) by mouth 3 (three) times daily.   tamsulosin 0.4 MG Caps capsule Commonly known as: FLOMAX Take 1 capsule (0.4 mg total) by mouth daily after supper.  traZODone 50 MG tablet Commonly known as: DESYREL Take 1 tablet (50 mg total) by mouth at bedtime.               Discharge Care Instructions  (From admission, onward)           Start     Ordered   03/01/22 0000  Discharge wound care:       Comments: Pressure Injury 02/20/22 Coccyx Mid Stage 1 -  Intact skin with non-blanchable redness of a localized area usually over a bony prominence. 9 days   Pressure Injury 02/20/22 Heel Left Stage 1 -  Intact skin with non-blanchable redness of a localized area usually over a bony prominence.  Pressure Injury 02/20/22 Coccyx Mid Stage 1 -  Intact skin with non-blanchable redness of a localized area usually over a bony  prominence. 9 days   Pressure Injury 02/20/22 Heel Left Stage 1 -  Intact skin with non-blanchable redness of a localized area usually over a bony prominence.   03/01/22 0843           No Known Allergies    The results of significant diagnostics from this hospitalization (including imaging, microbiology, ancillary and laboratory) are listed below for reference.    Significant Diagnostic Studies: IR Paracentesis  Result Date: 02/27/2022 INDICATION: Patient with a history of cirrhosis and recurrent ascites. Interventional radiology asked to perform a diagnostic and therapeutic paracentesis. EXAM: ULTRASOUND GUIDED PARACENTESIS MEDICATIONS: 1% lidocaine 10 mL COMPLICATIONS: None immediate. PROCEDURE: Informed written consent was obtained from the patient after a discussion of the risks, benefits and alternatives to treatment. A timeout was performed prior to the initiation of the procedure. Initial ultrasound scanning demonstrates a large amount of ascites within the right lower abdominal quadrant. The right lower abdomen was prepped and draped in the usual sterile fashion. 1% lidocaine was used for local anesthesia. Following this, a 19 gauge, 7-cm, Yueh catheter was introduced. An ultrasound image was saved for documentation purposes. The paracentesis was performed. The catheter was removed and a dressing was applied. The patient tolerated the procedure well without immediate post procedural complication. Patient received post-procedure intravenous albumin; see nursing notes for details. FINDINGS: A total of approximately 5.4 L of clear yellow fluid was removed. Samples were sent to the laboratory as requested by the clinical team. IMPRESSION: Successful ultrasound-guided paracentesis yielding 5.4 liters of peritoneal fluid. Read by: Soyla Dryer, NP PLAN: The patient has previously been evaluated by the [facility], and deemed not a candidate  for intervention at this time due to persistently elevated Na-MELD Electronically Signed   By: Ruthann Cancer M.D.   On: 02/27/2022 14:36   DG Swallowing Func-Speech Pathology  Result Date: 02/27/2022 Table formatting from the original result was not included. Objective Swallowing Evaluation: Type of Study: MBS-Modified Barium Swallow Study  Patient Details Name: MANNING LUNA MRN: [id number] Date of Birth: May 03, 1951 Today's Date: 02/27/2022 Time: SLP Start Time (ACUTE ONLY): 1237 -SLP Stop Time (ACUTE ONLY): 6962 SLP Time Calculation (min) (ACUTE ONLY): 18 min Past Medical History: Past Medical History: Diagnosis Date  Cataract   CKD (chronic kidney disease) stage 3, GFR 30-59 ml/min (HCC)   Coronary atherosclerosis of native coronary artery   BMS LAD 2006; residual 75% distal RCA; EF 50%  Essential hypertension   Glaucoma   Low back pain   MI (myocardial infarction) ([place])   Anterior 2006  Mixed hyperlipidemia   Renal insufficiency   Type 2 diabetes mellitus ([place])  Past Surgical History: Past Surgical History: Procedure Laterality Date  CATARACT EXTRACTION, BILATERAL    CORONARY ANGIOPLASTY WITH STENT PLACEMENT  2006  ESOPHAGEAL BANDING  02/01/2022  Procedure: ESOPHAGEAL BANDING;  Surgeon: Eloise Harman, DO;  [facility];  Service: Endoscopy;;  ESOPHAGEAL BANDING  02/20/2022  Procedure: ESOPHAGEAL BANDING;  Surgeon: Ladene Artist, MD;  Location: [facility] ENDOSCOPY;  Service: Gastroenterology;;  ESOPHAGOGASTRODUODENOSCOPY (EGD) WITH PROPOFOL N/A 02/01/2022  Procedure: ESOPHAGOGASTRODUODENOSCOPY (EGD) WITH PROPOFOL;  Surgeon: Eloise Harman, DO;  Location: [facility] ENDO SUITE;  Service: Endoscopy;  Laterality: N/A;  12:30 am  ESOPHAGOGASTRODUODENOSCOPY (EGD) WITH PROPOFOL N/A 02/20/2022  Procedure: ESOPHAGOGASTRODUODENOSCOPY (EGD) WITH PROPOFOL;  Surgeon: Ladene Artist, MD;  Location: [facility] ENDOSCOPY;  Service: Gastroenterology;  Laterality: N/A; HPI: DIAZ CRAGO is a 71 y.o. male who presented to [place]  10/16 with cramping of the hands and feet.  He had had a paracentesis earlier that day with 4 L removed (see below).  While in ED, he began to spit up bright red blood.  This progressed to frank hematemesis, large-volume and persistent.  He was started on Protonix, octreotide, ceftriaxone.  He then became hypotensive; therefore, massive transfusion protocol was initiated.  He received 8 units PRBC, 2 units FFP, 1 unit platelets.  He was intubated for airway protection was also started on Levophed. Pt with EGD 10/18 with banding x6. CXR 10/18 without focal infiltrate. 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  He was apparently evaluated at [facility] for transplant but was deemed to not be a candidate.  No data recorded  Recommendations for follow up therapy are one component of a multi-disciplinary discharge planning process, led by the attending physician.  Recommendations may be updated based on patient status, additional functional criteria and insurance authorization. Assessment / Plan / Recommendation   02/27/2022   1:11 PM Clinical Impressions Clinical Impression Pt presents with a mild oropharyngeal dysphagia c/b delayed swallow initiation, reduced base of tongue retraction, incomplete laryngeal closure, and diminished sensation.  These deficits resulted in penetration of thin liquid during the swallow.  Penetration was silent, but did fully clear with subsequent swallows.  There was moderate vallecula residue with all consistencies which was also reduced with subequent swallows.  With pill simulation, there was brief stasis of tablet in vallecula.  There was esophagea stasis with table which was not fully cleared by liquid wash, but did clear with puree bolus.  Recommend advancing pt to regular texture solids  as medically appropriate.  Alternate liquids and solids.  SLP Visit Diagnosis Dysphagia, oropharyngeal phase (R13.12) Impact on safety and function Mild aspiration risk     02/27/2022   1:11 PM Treatment Recommendations Treatment Recommendations No treatment recommended at this time     02/27/2022   1:15 PM Prognosis Prognosis for Safe Diet Advancement --   02/27/2022   1:11 PM Diet Recommendations SLP Diet Recommendations Thin liquid;Regular solids Liquid Administration via Straw Medication Administration Whole meds with liquid Compensations Slow rate;Small sips/bites;Follow solids with liquid     02/27/2022   1:11 PM Other Recommendations Oral Care Recommendations Oral care BID Follow Up Recommendations No SLP follow up Assistance recommended at discharge -- Functional Status Assessment Patient has not had a recent decline in their functional status   02/23/2022  12:29 PM Frequency and Duration  Speech Therapy Frequency (ACUTE ONLY)  min 2x/week Treatment Duration 2 weeks     02/27/2022   1:06 PM Oral Phase Oral - Thin Straw Premature spillage Oral - Puree WFL Oral - Regular [facility]    02/27/2022   1:09 PM Pharyngeal Phase Pharyngeal- Thin Straw Reduced tongue base retraction;Penetration/Aspiration during swallow Pharyngeal- Puree Pharyngeal residue - valleculae;Reduced tongue base retraction;Delayed swallow initiation-vallecula Pharyngeal- Regular Reduced tongue base retraction;Pharyngeal residue - valleculae Pharyngeal- Pill Reduced tongue base retraction;Pharyngeal residue - valleculae Pharyngeal Material does not enter airway    02/27/2022   1:08 PM Cervical Esophageal Phase  Cervical Esophageal Phase Impaired Edward Savage, MA, CCC-SLP Acute Rehabilitation Services Office: [phone] 02/27/2022, 1:20 PM                     DG Chest Port 1 View  Result Date: 02/21/2022 CLINICAL DATA:  Ventilator dependent respiratory failure. EXAM: PORTABLE CHEST 1 VIEW COMPARISON:  Portable chest yesterday at 10:23 p.m.  FINDINGS: 4:59 a.m. ETT tip is 3.1 cm from the carina, left IJ central line tip in the distal SVC. There are multiple overlying monitor wires. There are low lung volumes. Mild elevation right hemidiaphragm limiting visualization of the right base. There is perihilar and right basilar linear atelectasis without focal consolidation the visualized lungs. The cardiomediastinal silhouette and vasculature are normal. There is thoracic spondylosis. IMPRESSION: Limited exam with low lung volumes and elevated right hemidiaphragm. Assessment of the bases is limited. The visualized lungs without focal infiltrates. Support apparatus as above. Electronically Signed   By: Telford Nab M.D.   On: 02/21/2022 06:18   DG CHEST PORT 1 VIEW  Result Date: 02/20/2022 CLINICAL DATA:  Central line placement EXAM: PORTABLE CHEST 1 VIEW COMPARISON:  02/20/2022 FINDINGS: Left central line is been placed with the tip in the SVC. No pneumothorax. Endotracheal tube is 5 cm above the carina. Heart normal size. No confluent opacities or effusions. IMPRESSION: Support devices in expected position as above. No acute cardiopulmonary disease. Electronically Signed   By: Rolm Baptise M.D.   On: 02/20/2022 22:30   DG Chest Port 1 View  Result Date: 02/20/2022 CLINICAL DATA:  Intubation EXAM: PORTABLE CHEST 1 VIEW COMPARISON:  02/13/2022 FINDINGS: Endotracheal tube with tip just below the clavicular heads. The enteric tube at least reaches the stomach. Artifact from EKG leads. Low volume chest with streaky density at the bases suggesting atelectasis. No effusion or pneumothorax. Skin fold at the left apex. IMPRESSION: 1. Unremarkable hardware positioning. 2. Low volume chest with mild atelectasis. Electronically Signed   By: Jorje Guild M.D.   On: 02/20/2022 04:13   US Paracentesis  Result Date: 02/19/2022 INDICATION: Ascites secondary to cirrhosis EXAM: ULTRASOUND GUIDED  PARACENTESIS MEDICATIONS: None. COMPLICATIONS: None  immediate. PROCEDURE: Informed written consent was obtained from the patient after a discussion of the risks, benefits and alternatives to treatment. A timeout was performed prior to the initiation of the procedure. Initial ultrasound scanning demonstrates a large amount of ascites within the right lower abdominal quadrant. The right lower abdomen was prepped and draped in the usual sterile fashion. 1% lidocaine was used for local anesthesia. Following this, a 19 gauge, 7-cm, Yueh catheter was introduced. An ultrasound image was saved for documentation purposes. The paracentesis was performed. The catheter was removed and a dressing was applied. The patient tolerated the procedure well without immediate post procedural complication. Patient received post-procedure intravenous albumin; see nursing notes for details. FINDINGS: A total of approximately 4.0 L of serous fluid was removed. Samples  were sent to the laboratory as requested by the clinical team. IMPRESSION: Successful ultrasound-guided paracentesis yielding 4.0 L of peritoneal fluid. Electronically Signed   By: Abigail Miyamoto M.D.   On: 02/19/2022 12:13   US Paracentesis  Result Date: 02/14/2022 INDICATION: Cirrhosis with recurrent ascites EXAM: ULTRASOUND GUIDED RLQ PARACENTESIS MEDICATIONS: None. COMPLICATIONS: None immediate. PROCEDURE: Informed written consent was obtained from the patient after a discussion of the risks, benefits and alternatives to treatment. A timeout was performed prior to the initiation of the procedure. Initial ultrasound scanning demonstrates a large amount of ascites within the right lower abdominal quadrant. The right lower abdomen was prepped and draped in the usual sterile fashion. 1% lidocaine was used for local anesthesia. Following this, a 19 gauge, 7-cm, Yueh catheter was introduced. An ultrasound image was saved for documentation purposes. The paracentesis was performed. The catheter was removed and a dressing was  applied. The patient tolerated the procedure well without immediate post procedural complication. Patient received post-procedure intravenous albumin; see nursing notes for details. FINDINGS: A total of approximately 3.4L of peritoneal fluid was removed. Samples were sent to the laboratory as requested by the clinical team. IMPRESSION: Successful ultrasound-guided paracentesis yielding 3.4 liters of peritoneal fluid. PLAN: The patient has required >/=2 paracenteses in a 30 day period and a formal evaluation by the [facility] has been arranged. Electronically Signed   By: Lavonia Dana M.D.   On: 02/14/2022 10:56   DG Abdomen Acute W/Chest  Result Date: 02/13/2022 CLINICAL DATA:  Short of breath, abdominal pain. History of cirrhosis EXAM: DG ABDOMEN ACUTE WITH 1 VIEW CHEST COMPARISON:  Chest and abdomen 02/04/2022 FINDINGS: Heart size and vascularity normal. Mild bibasilar atelectasis. No pleural effusion Normal bowel gas pattern. No bowel obstruction or free air. Negative for urinary tract calculi. IMPRESSION: 1. Mild bibasilar atelectasis. 2. Normal bowel gas pattern. Electronically Signed   By: Franchot Gallo M.D.   On: 02/13/2022 16:21   US Paracentesis  Result Date: 02/05/2022 INDICATION: Cirrhosis, ascites, fever EXAM: ULTRASOUND GUIDED DIAGNOSTIC AND THERAPEUTIC PARACENTESIS MEDICATIONS: None. COMPLICATIONS: None immediate. PROCEDURE: Informed written consent was obtained from the patient after a discussion of the risks, benefits and alternatives to treatment. A timeout was performed prior to the initiation of the procedure. Initial ultrasound scanning demonstrates a large amount of ascites within the right lower abdominal quadrant. The right lower abdomen was prepped and draped in the usual sterile fashion. 1% lidocaine was used for local anesthesia. Following this, a 19 gauge, 7-cm, Yueh catheter was introduced. An ultrasound image was saved for  documentation purposes. The paracentesis was performed. The catheter was removed and a dressing was applied. The patient tolerated the procedure well without immediate post procedural complication. Patient received post-procedure intravenous albumin; see nursing notes for details. FINDINGS: A total of approximately 4.5 L of clear yellow ascitic fluid was removed. Samples were sent to the laboratory as requested by the clinical team. IMPRESSION: Successful ultrasound-guided paracentesis yielding 4.5 liters of peritoneal fluid. Electronically Signed   By: Lavonia Dana M.D.   On: 02/05/2022 09:54   CT ABDOMEN PELVIS WO CONTRAST  Result Date: 02/04/2022 CLINICAL DATA:  Bowel obstruction suspected EXAM: CT ABDOMEN AND PELVIS WITHOUT CONTRAST TECHNIQUE: Multidetector CT imaging of the abdomen and pelvis was performed following the standard protocol without IV contrast. RADIATION DOSE REDUCTION: This exam was performed according to the departmental dose-optimization program which includes automated exposure control, adjustment of the mA and/or kV according to patient size  and/or use of iterative reconstruction technique. COMPARISON:  CT abdomen pelvis 11/24/2021 FINDINGS: Lower chest: Trace bilateral pleural effusions. Thickening of the visualized distal esophagus likely due to paraseptal varices. Four-vessel coronary artery calcification. Hepatobiliary: Nodular hepatic contour. Enlarged caudate lobe. No focal liver abnormality. Calcified gallstones noted within the gallbladder lumen. No gallbladder wall thickening or pericholecystic fluid. No biliary dilatation. Pancreas: No focal lesion. Normal pancreatic contour. No surrounding inflammatory changes. No main pancreatic ductal dilatation. Spleen: The spleen is enlarged in size measuring up to 14 cm. No focal splenic lesion. Adrenals/Urinary Tract: No adrenal nodule bilaterally. No nephrolithiasis and no hydronephrosis. No definite contour-deforming renal mass. No  ureterolithiasis or hydroureter. The urinary bladder is unremarkable. Stomach/Bowel: Stomach is within normal limits. No evidence of bowel wall thickening or dilatation. Portal colopathy of the ascending colon. Sigmoid diverticulosis. Appendix appears normal. Vascular/Lymphatic: No abdominal aorta or iliac aneurysm. Mild to moderate atherosclerotic plaque of the aorta and its branches. No abdominal, pelvic, or inguinal lymphadenopathy. Reproductive: Prostate is unremarkable. Other: Interval slight increase in moderate volume free intraperitoneal fluid. Esophageal and paraesophageal venous collaterals. No intraperitoneal free gas. No organized fluid collection. Musculoskeletal: No abdominal wall hernia or abnormality. No suspicious lytic or blastic osseous lesions. No acute displaced fracture. Multilevel degenerative changes of the spine. IMPRESSION: 1. Trace bilateral pleural effusions. 2. Slight interval increase in size of moderate volume ascites. 3. Cirrhosis with portal hypertension. No focal liver lesions identified. Please note that liver protocol enhanced MR and CT are the most sensitive tests for the screening detection of hepatocellular carcinoma in the high risk setting of cirrhosis. 4. Thickening of the visualized distal esophagus likely due to esophageal and paraesophageal varices. Markedly limited evaluation due to noncontrast study. 5. Portal colopathy of the ascending colon. 6. Cholelithiasis with no CT finding of acute cholecystitis. 7. Colonic diverticulosis with no acute diverticulitis. 8. Aortic Atherosclerosis (ICD10-I70.0) including four-vessel coronary calcification. Electronically Signed   By: Iven Finn M.D.   On: 02/04/2022 16:10   DG Abdomen Acute W/Chest  Result Date: 02/04/2022 CLINICAL DATA:  Cough. Stomach distention. Fever, chills, and fatigue. Decreased urinary output. EXAM: DG ABDOMEN ACUTE WITH 1 VIEW CHEST COMPARISON:  Chest radiographs 01/23/2022 and 12/08/2021. CT  abdomen and pelvis 11/24/2021 FINDINGS: Cardiac silhouette and mediastinal contours are within normal limits. Moderately decreased lung volumes, mildly worsened from 01/23/2022 most recent prior chest radiograph. Right-greater-than-left horizontal linear basilar likely subsegmental atelectasis is mildly increased from 01/22/2022. No pleural effusion or pneumothorax. Moderate stool within the ascending and transverse colon. Air is seen within loops of small bowel as well as the ascending, transverse, descending colon, and rectum. On supine view, there is a single loop of vertically oriented bowel overlying the lateral left hemiabdomen that is mildly distended, measuring up to 3.5 cm. Mild-to-moderate multilevel degenerative disc changes of the visualized thoracic and lumbar spine. Mild levocurvature of the upper lumbar spine. No subdiaphragmatic free air on upright view. IMPRESSION: 1. Right-greater-than-left horizontal linear basilar likely subsegmental atelectasis. 2. Moderate stool within the ascending and transverse colon. 3. On supine view, there is a single loop of vertically oriented bowel overlying the lateral left hemiabdomen that is mildly distended. This may represent a focal ileus. Air is seen within the more distal colon. Electronically Signed   By: Yvonne Kendall M.D.   On: 02/04/2022 15:02    Microbiology: Recent Results (from the past 240 hour(s))  Gram stain     Status: None   Collection Time: 02/19/22 10:50 AM   Specimen:  Peritoneal Washings  Result Value Ref Range Status   Specimen Description PERITONEAL  Final   Special Requests NONE  Final   Gram Stain   Final    NO ORGANISMS SEEN WBC PRESENT, PREDOMINANTLY MONONUCLEAR CYTOSPIN SMEAR Performed at [facility], [address], [place], [place] [address]    Report Status 02/19/2022 FINAL  Final  Culture, body fluid w Gram Stain-bottle     Status: None   Collection Time: 02/19/22 10:50 AM   Specimen: Peritoneal Washings  Result Value  Ref Range Status   Specimen Description PERITONEAL  Final   Special Requests BOTTLES DRAWN AEROBIC AND ANAEROBIC 10CC  Final   Culture   Final    NO GROWTH 5 DAYS Performed at [facility], [address] [place], [place] [address]    Report Status 02/24/2022 FINAL  Final  MRSA Next Gen by PCR, Nasal     Status: None   Collection Time: 02/20/22  9:12 AM   Specimen: Nasal Mucosa; Nasal Swab  Result Value Ref Range Status   MRSA by PCR Next Gen NOT DETECTED NOT DETECTED Final    Comment: (NOTE) The GeneXpert MRSA Assay (FDA approved for NASAL specimens only), is one component of a comprehensive MRSA colonization surveillance program. It is not intended to diagnose MRSA infection nor to guide or monitor treatment for MRSA infections. Test performance is not FDA approved in patients less than 97 years old. Performed at [facility], [place] [address], [place], [place] [address]   Culture, body fluid w Gram Stain-bottle     Status: None (Preliminary result)   Collection Time: 02/27/22  1:59 PM   Specimen: Fluid  Result Value Ref Range Status   Specimen Description FLUID PERITONEAL ABDOMEN  Final   Special Requests BOTTLES DRAWN AEROBIC AND ANAEROBIC  Final   Culture   Final    NO GROWTH < 24 HOURS Performed at [facility], [place] [address], [place], [place] [address]    Report Status PENDING  Incomplete  Gram stain     Status: None   Collection Time: 02/27/22  1:59 PM   Specimen: Fluid  Result Value Ref Range Status   Specimen Description FLUID PERITONEAL ABDOMEN  Final   Special Requests NONE  Final   Gram Stain   Final    WBC PRESENT,BOTH PMN AND MONONUCLEAR NO ORGANISMS SEEN CYTOSPIN SMEAR Performed at [facility], [address], [place], [place] [address]    Report Status 02/27/2022 FINAL  Final     Labs: Basic Metabolic Panel: Recent Labs  Lab 02/23/22 0418 02/24/22 0139 02/25/22 0059 02/26/22 0112 02/27/22 0142 02/28/22 0317 03/01/22 0115   NA 141 138 136 136 136 137 131*  K 4.3 3.1* 3.4* 3.4* 4.1 3.9 4.1  CL 108 105 106 106 108 108 103  CO2 25 21* 20* 23 21* 19* 20*  GLUCOSE 176* 63* 118* 101* 99 128* 220*  BUN 77* 77* 69* 59* 50* 39* 40*  CREATININE 3.10* 2.82* 2.61* 2.49* 2.17* 2.04* 2.26*  CALCIUM 8.5* 9.2 8.5* 8.6* 8.6* 8.8* 8.5*  MG 2.1 2.1  --   --   --   --   --   PHOS 6.0* 4.1  --   --   --   --   --    Liver Function Tests: Recent Labs  Lab 02/25/22 0059 02/26/22 0112 02/27/22 0142 02/28/22 0317 03/01/22 0115  AST 39 41 40 44* 53*  ALT 23 29  31 32 41  ALKPHOS 36* 40 43 42 63  BILITOT 2.4* 2.8* 3.0* 2.8* 1.7*  PROT 4.5* 4.6* 4.6* 4.9* 4.7*  ALBUMIN 2.8* 2.8* 2.7* 2.9* 2.8*   No results for input(s): "LIPASE", "AMYLASE" in the last 168 hours. Recent Labs  Lab 02/22/22 1302 02/25/22 0059  AMMONIA 91* 32   CBC: Recent Labs  Lab 02/25/22 0059 02/26/22 0112 02/27/22 0142 02/28/22 0317 03/01/22 0115  WBC 3.5* 3.6* 2.9* 3.1* 5.0  HGB 8.3* 8.6* 8.9* 9.2* 9.5*  HCT 24.5* 25.8* 26.4* 26.5* 28.2*  MCV 92.1 93.1 93.0 93.6 95.3  PLT 38* 44* 44* 41* 50*   Cardiac Enzymes: No results for input(s): "CKTOTAL", "CKMB", "CKMBINDEX", "TROPONINI" in the last 168 hours. BNP: BNP (last 3 results) No results for input(s): "BNP" in the last 8760 hours.  ProBNP (last 3 results) No results for input(s): "PROBNP" in the last 8760 hours.  CBG: Recent Labs  Lab 02/28/22 0558 02/28/22 1131 02/28/22 1641 02/28/22 2112 03/01/22 0622  GLUCAP 154* 247* 231* 256* 182*       Signed:  Nita Sells MD   [facility] 03/01/2022, 8:39 AM

## 2022-03-02 ENCOUNTER — Telehealth: Payer: Self-pay | Admitting: Internal Medicine

## 2022-03-02 ENCOUNTER — Telehealth: Payer: Self-pay

## 2022-03-02 NOTE — Telephone Encounter (Signed)
Also to note patient had a para on 02/27/22. FYI

## 2022-03-02 NOTE — Telephone Encounter (Signed)
Pt's exwife came to front desk asking to speak with Roseanne Kaufman, NP. Mandy told her that Vicente Males wasn't in the office today. She proceeds to tell us patient was discharged from the hospital yesterday and he needed fluid drawn off of him ASAP and was told to come here to get an order. Gala Romney nurse told Leafy Ro to tell exwife to take him to the ER and Venetia Night, NP was rounding over at the hospital. She said she was not taking him to the ER and sit 8 hours that the patient was sick and needed it done now. At this point Tammy C. Came up front to speak with her and was told if he was that sick he needed to go to the ER, and the provider on call could evaluate and order para. She wanted to disagree with everything offered and got mad and walked out.

## 2022-03-02 NOTE — Telephone Encounter (Signed)
Pt's wife came in to the office requesting that the patient be scheduled for a para. Wife states that he just got out to the hospital yesterday and that he is sick and needs something done. I informed the patient's wife that there was only one provider working today and that he needed to proceed back to the ER for evaluation. Wife stated that he was unable to sit in the ER for 8 hours and that we needed to do something to help him. I apologized to the patient's wife for the inability to have him seen today and she left stating that she will talk to someone else.

## 2022-03-02 NOTE — Telephone Encounter (Signed)
Dr. Abbey Chatters, pt was recently admitted and had EGD done. Does he still need 11/6 procedure?

## 2022-03-02 NOTE — Telephone Encounter (Signed)
Was asked by Estill Bamberg regarding this pt's wife wanting her husband to be able to have fluid drawn off. I advised Estill Bamberg that Edward [name] is off and we cannot give an order for a para.also advised [name] one of our providers was there covering. Pt needed to go to the ED to be evaluated

## 2022-03-02 NOTE — Telephone Encounter (Signed)
Transition Care Management Unsuccessful Follow-up Telephone Call  Date of discharge and from where:  03/01/2022 Edward [name]  Attempts:  1st Attempt  Reason for unsuccessful TCM follow-up call:  I was not able to speak to patient - spoke to his ex-wife she states that since leaving the hospital he has had fluid build up again. She states that she has not been able to get in touch with [place] office to get order placed for procedure to pull off fluid. Spoke to nursing staff and was advised to inform the patient that he would need to go to ED so order can be placed through them if they are unable to reach anyone at [facility] office. She expressed understanding.

## 2022-03-04 LAB — CULTURE, BODY FLUID W GRAM STAIN -BOTTLE: Culture: NO GROWTH

## 2022-03-05 ENCOUNTER — Telehealth: Payer: Self-pay | Admitting: Gastroenterology

## 2022-03-05 ENCOUNTER — Telehealth: Payer: Self-pay | Admitting: Cardiology

## 2022-03-05 NOTE — Telephone Encounter (Addendum)
Multiple phone calls noted. Recent para on 10/24 with 5.4 liters removed. He has required multiple paras. Recently inpatient from 10/17-10/26 with massive UGI bleed secondary to varices s/p multiple banding. Received multiple units of PRBCs.   He is not a candidate for transplant per [facility]. He was to see Roosevelt Locks, NP, locally, but this was postponed as he was hospitalized in interim.   Mandy/Susan: He is decompensated and needs an office visit THIS WEEK. I may not have any openings; if that is the case, needs to be put into another APPs schedule or Dr. Abbey Chatters in a FOLLOW-UP spot (not a new spot. Needs to be a follow-up spot).   Need to have a conversation about goals of care.   Mindy/Tammy: if he is feeling that he needs a para, we can arrange one as outpatient. NO MORE THAN 4 liters removed. I am concerned about his renal function. Needs Albumin 25 g IV at time of para (even though we are only doing max of 4 liters). Hold diuretics day of para.   Copying Venetia Night, NP, as Juluis Rainier, as I know she was dealing with this Friday.

## 2022-03-05 NOTE — Telephone Encounter (Signed)
Calling to get a order put in to get the fluid drain off the patient stomach. Please advise

## 2022-03-05 NOTE — Telephone Encounter (Signed)
Noted  

## 2022-03-05 NOTE — Telephone Encounter (Signed)
Mandy/Susan: it looks like I have an urgent on 10/31 at 11 am. We can offer that one. If that doesn't work, it does need to be in an urgent slot.

## 2022-03-05 NOTE — Telephone Encounter (Signed)
Edward Males, do you still want [place] to schedule him for a PARA?

## 2022-03-05 NOTE — Telephone Encounter (Signed)
I am wondering if this appointment is with Edward [name] in [place]? I doubt he would be able to do a para today as well if he has an appt at 1 pm. Ideally, I would see him in person tomorrow.

## 2022-03-05 NOTE — Telephone Encounter (Signed)
Tammy, can you call patient and inform the EGD for 11/6 is cancelled. Sending to [place] as per Dr. Abbey Chatters he needs OV.

## 2022-03-05 NOTE — Telephone Encounter (Signed)
Pt's wife came into the office this morning and was offered an appointment with [name] today at 3 and also one on Wed at 1. She did not want either. She stated that he has an appt with the liver doctor today at 1pm. I instructed her to mention everything that is going on with the patient to them at that appointment and see what they recommend and if she decides that the pt would like the appt on Wed to call the office and let us know.

## 2022-03-05 NOTE — Telephone Encounter (Signed)
Pt has an appt with Vicente Males tomorrow, Vicente Males will address at the appointment.

## 2022-03-06 ENCOUNTER — Encounter (HOSPITAL_COMMUNITY): Payer: Self-pay

## 2022-03-06 ENCOUNTER — Other Ambulatory Visit: Payer: Self-pay | Admitting: *Deleted

## 2022-03-06 ENCOUNTER — Encounter: Payer: Self-pay | Admitting: Gastroenterology

## 2022-03-06 ENCOUNTER — Ambulatory Visit (HOSPITAL_COMMUNITY)
Admission: RE | Admit: 2022-03-06 | Discharge: 2022-03-06 | Disposition: A | Discharge status: Home / Self Care | Payer: 59 | Source: Ambulatory Visit | Attending: Gastroenterology | Admitting: Gastroenterology

## 2022-03-06 ENCOUNTER — Ambulatory Visit (INDEPENDENT_AMBULATORY_CARE_PROVIDER_SITE_OTHER): Payer: 59 | Admitting: Gastroenterology

## 2022-03-06 VITALS — BP 104/66 | HR 121 | Temp 97.7°F | Ht 70.0 in | Wt 156.8 lb

## 2022-03-06 DIAGNOSIS — R188 Other ascites: Secondary | ICD-10-CM

## 2022-03-06 DIAGNOSIS — K7460 Unspecified cirrhosis of liver: Secondary | ICD-10-CM | POA: Diagnosis not present

## 2022-03-06 DIAGNOSIS — K7290 Hepatic failure, unspecified without coma: Secondary | ICD-10-CM

## 2022-03-06 LAB — CYTOLOGY - NON PAP

## 2022-03-06 LAB — BODY FLUID CELL COUNT WITH DIFFERENTIAL
Eos, Fluid: 0 %
Lymphs, Fluid: 58 %
Monocyte-Macrophage-Serous Fluid: 38 % — ABNORMAL LOW (ref 50–90)
Neutrophil Count, Fluid: 4 % (ref 0–25)
Total Nucleated Cell Count, Fluid: 129 cu mm (ref 0–1000)

## 2022-03-06 LAB — GRAM STAIN

## 2022-03-06 MED ORDER — ALBUMIN HUMAN 25 % IV SOLN
INTRAVENOUS | Status: AC
  Administered 2022-03-06: 25 g via INTRAVENOUS
  Filled 2022-03-06: qty 100

## 2022-03-06 MED ORDER — ALBUMIN HUMAN 25 % IV SOLN
0.0000 g | Freq: Once | INTRAVENOUS | Status: AC
  Filled 2022-03-06: qty 400

## 2022-03-06 NOTE — Patient Instructions (Addendum)
We are arranging a paracentesis as soon as possible!  We are also arranging an upper endoscopy with Dr. Abbey Chatters in the near future.   We will arrange standing para orders. You will call Central Scheduling when you need to have this done.  Further recommendations to follow!  I enjoyed seeing you again today! As you know, I value our relationship and want to provide genuine, compassionate, and quality care. I welcome your feedback. If you receive a survey regarding your visit,  I greatly appreciate you taking time to fill this out. See you next time!  Annitta Needs, PhD, ANP-BC [facility]

## 2022-03-06 NOTE — Progress Notes (Signed)
Gastroenterology Office Note     Primary Care Physician:  [name], FNP  Primary Gastroenterologist: Dr. Abbey Chatters   Chief Complaint   Chief Complaint  Patient presents with   Follow-up    Pt wants order to have a para done     History of Present Illness   Edward [name] is a 71 y.o. male presenting today in follow-up with a history of decompensated cirrhosis of unclear etiology, concern for Wilson's. He was seen by [facility], NP) yesterday.   Recent para on 10/24 with 5.4 liters removed. He has required multiple paras. Recently inpatient from 10/17-10/26 with massive UGI bleed secondary to varices s/p multiple banding. Received multiple units of PRBCs.   MELD 3.0 at [facility] in [place] was 47. Cirrhosis is decompensated with portal hypertension, ascites, varices, splenomegally, coagulopathy, prior GI bleeding, encephalopathy, and hyponatremia. Poor transplant candidate due to multiple medical comorbidities including advanced age, CAD with previous MI, CKD 3, poor performance status, in addition to psychosocial concerns involving ability to travel to transplant center. He would also likely need a combined liver kidney transplant.  States not taking Lasix. His abdomen feels distended. He is desiring para.   BM 3-5 times per day on lactulose.   No overt GI bleeding, confusion, mental status changes. He and his ex-wife are adamantly opposed to palliative care or hospice discussion. He does not have a living will. He is not interested in discussing this today. They want to pursue every full scope of treatment.     Past Medical History:  Diagnosis Date   Cataract    CKD (chronic kidney disease) stage 3, GFR 30-59 ml/min (HCC)    Coronary atherosclerosis of native coronary artery    BMS LAD 2006; residual 75% distal RCA; EF 50%   Essential hypertension    Glaucoma    Low back pain    MI (myocardial infarction) ([place])    Anterior  2006   Mixed hyperlipidemia    Renal insufficiency    Type 2 diabetes mellitus ([place])     Past Surgical History:  Procedure Laterality Date   CATARACT EXTRACTION, BILATERAL     CORONARY ANGIOPLASTY WITH STENT PLACEMENT  2006   ESOPHAGEAL BANDING  02/01/2022   Procedure: ESOPHAGEAL BANDING;  Surgeon: Eloise Harman, DO;  Location[facility];  Service: Endoscopy;;   ESOPHAGEAL BANDING  02/20/2022   Procedure: ESOPHAGEAL BANDING;  Surgeon: Ladene Artist, MD;  Location: [facility] ENDOSCOPY;  Service: Gastroenterology;;   ESOPHAGOGASTRODUODENOSCOPY (EGD) WITH PROPOFOL N/A 02/01/2022   Procedure: ESOPHAGOGASTRODUODENOSCOPY (EGD) WITH PROPOFOL;  Surgeon: Eloise Harman, DO;  Location: [facility];  Service: Endoscopy;  Laterality: N/A;  12:30 am   ESOPHAGOGASTRODUODENOSCOPY (EGD) WITH PROPOFOL N/A 02/20/2022   Procedure: ESOPHAGOGASTRODUODENOSCOPY (EGD) WITH PROPOFOL;  Surgeon: Ladene Artist, MD;  Location: [facility] ENDOSCOPY;  Service: Gastroenterology;  Laterality: N/A;   IR PARACENTESIS  02/27/2022    No current facility-administered medications for this visit.   No current outpatient medications on file.   Facility-Administered Medications Ordered in Other Visits  Medication Dose Route Frequency Provider Last Rate Last Admin   0.9 %  sodium chloride infusion (Manually program via Guardrails IV Fluids)   Intravenous Once Tat, David, MD       0.9 %  sodium chloride infusion  250 mL Intravenous Continuous Tat, David, MD       brimonidine (ALPHAGAN) 0.2 % ophthalmic solution 1 drop  1 drop Both  Eyes BID Truett Mainland, DO   1 drop at 03/14/22 1002   Chlorhexidine Gluconate Cloth 2 % PADS 6 each  6 each Topical Daily Tat, David, MD   6 each at 03/14/22 0544   dexmedetomidine (PRECEDEX) 400 MCG/100ML (4 mcg/mL) infusion  0.4-1.2 mcg/kg/hr Intravenous Titrated Adefeso, Oladapo, DO 16.45 mL/hr at 03/14/22 0813 1 mcg/kg/hr at 03/14/22 0813   docusate (COLACE) 50 MG/5ML liquid 100 mg  100 mg  Per Tube BID Tat, Shanon Brow, MD       fentaNYL (SUBLIMAZE) injection 25 mcg  25 mcg Intravenous Q15 min PRN Tat, Shanon Brow, MD   25 mcg at 03/13/22 1156   fentaNYL (SUBLIMAZE) injection 25-100 mcg  25-100 mcg Intravenous Q30 min PRN Tat, Shanon Brow, MD   100 mcg at 03/12/22 2148   fentaNYL 2535mg in NS 2543m(1033mml) infusion-PREMIX  0-200 mcg/hr Intravenous Continuous OgaFrederik PearD 20 mL/hr at 03/14/22 0719 200 mcg/hr at 03/14/22 0719   insulin aspart (novoLOG) injection 0-6 Units  0-6 Units Subcutaneous Q4H TatOrson EvaD   1 Units at 03/14/22 0838   insulin glargine-yfgn (SEMGLEE) injection 5 Units  5 Units Subcutaneous Daily Tat, David, MD   5 Units at 03/13/22 1736   ipratropium-albuterol (DUONEB) 0.5-2.5 (3) MG/3ML nebulizer solution 3 mL  3 mL Nebulization Q6H PRN Tat, DavShanon BrowD   3 mL at 03/12/22 0326   midodrine (PROAMATINE) tablet 20 mg  20 mg Oral TID WC StiLoma Boston DO   20 mg at 03/09/2022 [id number] (LEVOPHED) 16 mg in 250m102m.064 mg/mL) premix infusion  0-40 mcg/min Intravenous Titrated OganFrederik Pear 4.69 mL/hr at 03/14/22 0233 5 mcg/min at 03/14/22 0233   octreotide (SANDOSTATIN) 500 mcg in sodium chloride 0.9 % 250 mL (2 mcg/mL) infusion  50 mcg/hr Intravenous Continuous StinTruett Mainland 25 mL/hr at 03/14/22 0722 50 mcg/hr at 03/14/22 0722   ondansetron (ZOFRAN) tablet 4 mg  4 mg Oral Q6H PRN StinTruett Mainland       Or   ondansetron (ZOFDenver Mid Town Surgery Center Ltdjection 4 mg  4 mg Intravenous Q6H PRN StinTruett Mainland   4 mg at 03/12/22 [id number] care mouth rinse  15 mL Mouth Rinse PRN Tat, DaviShanon Brow       Oral care mouth rinse  15 mL Mouth Rinse Q2H Ferd Glassing   15 mL at 03/14/22 1002   pantoprazole (PROTONIX) injection 40 mg  40 mg Intravenous Once CastMontez MoritaniQuillian Quince       piperacillin-tazobactam (ZOSYN) IVPB 3.375 g  3.375 g Intravenous Q12HTherisa Doyne 12.5 mL/hr at 03/13/22 2322 3.375 g at 03/13/22 2322   polyethylene glycol (MIRALAX / GLYCOLAX)  packet 17 g  17 g Per Tube Daily Tat, DaviShanon Brow        Allergies as of 03/06/2022   (No Known Allergies)    Family History  Problem Relation Age of Onset   Aneurysm Father        Brain aneurysm   Lung cancer Mother        Small cell carcinoma of lung,kidney and heart   Heart disease Mother    Cancer Brother        lungs   Diabetes Sister    Diabetes Sister    Diabetes Brother    Diabetes Brother     Social History   Socioeconomic History   Marital status: Legally Separated    Spouse name: MiraJeannetta Nap  Number of children: 2   Years of education: 7th   Highest education level: 7th grade  Occupational History   Occupation: Retired  Tobacco Use   Smoking status: Former    Packs/day: 2.00    Years: 20.00    Total pack years: 40.00    Types: Cigarettes    Quit date: 05/08/1987    Years since quitting: 34.8    Passive exposure: Past   Smokeless tobacco: Never  Vaping Use   Vaping Use: Never used  Substance and Sexual Activity   Alcohol use: No    Alcohol/week: 0.0 standard drinks of alcohol   Drug use: No   Sexual activity: Not Currently  Other Topics Concern   Not on file  Social History Narrative   Lives alone    Social Determinants of Health   Financial Resource Strain: Low Risk  (07/11/2021)   Overall Financial Resource Strain (CARDIA)    Difficulty of Paying Living Expenses: Not very hard  Food Insecurity: No Food Insecurity (04/05/2022)   Hunger Vital Sign    Worried About Running Out of Food in the Last Year: Never true    Ran Out of Food in the Last Year: Never true  Transportation Needs: No Transportation Needs (04/02/2022)   PRAPARE - Hydrologist (Medical): No    Lack of Transportation (Non-Medical): No  Physical Activity: Insufficiently Active (07/11/2021)   Exercise Vital Sign    Days of Exercise per Week: 7 days    Minutes of Exercise per Session: 20 min  Stress: No Stress Concern Present (07/11/2021)   [place]    Feeling of Stress : Not at all  Social Connections: Socially Isolated (07/11/2021)   Social Connection and Isolation Panel [NHANES]    Frequency of Communication with Friends and Family: More than three times a week    Frequency of Social Gatherings with Friends and Family: More than three times a week    Attends Religious Services: Never    Marine scientist or Organizations: No    Attends Archivist Meetings: Never    Marital Status: Separated  Intimate Partner Violence: Not At Risk (03/27/2022)   Humiliation, Afraid, Rape, and Kick questionnaire    Fear of Current or Ex-Partner: No    Emotionally Abused: No    Physically Abused: No    Sexually Abused: No     Review of Systems   See HPI   Physical Exam   BP 104/66   Pulse (!) 121   Temp 97.7 F (36.5 C)   Ht '5\' 10"'$  (1.778 m)   Wt 156 lb 12.8 oz (71.1 kg)   BMI 22.50 kg/m  General:   Alert and oriented. Chronically ill, frail-appearing Head:  Normocephalic and atraumatic. Eyes:  Without icterus Abdomen:  distended with tense ascites, no TTP Rectal:  Deferred  Msk:  Symmetrical without gross deformities. Normal posture. Extremities:  Without edema. Neurologic:  Alert and  oriented x4 Skin:  Intact without significant lesions or rashes. Psych:  Alert and cooperative. Normal mood and affect.   Assessment   Edward [name] is a 71 y.o. male presenting today in follow-up with a history of decompensated cirrhosis of unclear etiology, concern for Wilson's. He was seen by [place], NP) yesterday.  Cirrhosis is decompensated with portal hypertension, ascites, varices, splenomegally, coagulopathy, prior GI bleeding, encephalopathy, and hyponatremia. Poor transplant candidate due to multiple  medical comorbidities including advanced age, CAD with previous MI, CKD 3, poor performance status, in addition to psychosocial concerns  involving ability to travel to transplant center. He would also likely need a combined liver kidney transplant.  GI bleeding: Recently inpatient from 10/17-10/26 with massive UGI bleed secondary to varices s/p multiple banding. Received multiple units of PRBCs. Needs early interval EGD, which we will arrange.  Ascites: Recent para on 10/24 with 5.4 liters removed. He has required multiple paras. Renal function makes titration of diuretics difficult. Will arrange standing paras and include albumin 25 g IV at each para. NO MORE than 5 liters at a time.    He and his ex-wife are adamantly opposed to palliative care or hospice discussion. He does not have a living will. He is not interested in discussing this today. They want to pursue every full scope of treatment.    PLAN    [place] para with cell count, culture, cytology Standing orders for para with 25 g IV albumin at each time, no more than 5 liters EGD with Dr. Abbey Chatters in the near future Strongly recommend palliative care consultation but patient and ex-wife declining.    Annitta Needs, PhD, ANP-BC [facility]

## 2022-03-06 NOTE — Progress Notes (Signed)
Patient tolerated right sided paracentesis procedure and 25 G of IV albumin well today and 5 Liters of yellow ascites removed with labs collected and sent for processing. PT verbalized understanding of discharge instructions and left via wheelchair with family at this time with no acute distress noted.

## 2022-03-06 NOTE — Procedures (Signed)
PreOperative Dx: Cirrhosis, ascites Postoperative Dx: Cirrhosis, ascites Procedure:   US guided paracentesis Radiologist:  Jaki Steptoe Anesthesia:  10 ml of1% lidocaine Specimen:  5 L of yellow ascitic fluid EBL:   < 1 ml Complications: None  

## 2022-03-07 ENCOUNTER — Encounter (HOSPITAL_COMMUNITY): Payer: 59

## 2022-03-07 LAB — PATHOLOGIST SMEAR REVIEW

## 2022-03-07 NOTE — Telephone Encounter (Signed)
Per chart review, patient seen at [facility] on 10/30 and GI 10/31 Multiple messages left with no return call Will close encounter.

## 2022-03-09 ENCOUNTER — Other Ambulatory Visit: Payer: Self-pay | Admitting: Nurse Practitioner

## 2022-03-10 ENCOUNTER — Other Ambulatory Visit: Payer: Self-pay

## 2022-03-10 ENCOUNTER — Encounter (HOSPITAL_COMMUNITY): Payer: Self-pay | Admitting: Emergency Medicine

## 2022-03-10 ENCOUNTER — Inpatient Hospital Stay (HOSPITAL_COMMUNITY)
Admission: EM | Admit: 2022-03-10 | Discharge: 2022-04-06 | DRG: 682 | Disposition: E | Payer: 59 | Attending: Internal Medicine | Admitting: Internal Medicine

## 2022-03-10 ENCOUNTER — Emergency Department (HOSPITAL_COMMUNITY): Payer: 59

## 2022-03-10 DIAGNOSIS — K7200 Acute and subacute hepatic failure without coma: Secondary | ICD-10-CM | POA: Diagnosis not present

## 2022-03-10 DIAGNOSIS — D696 Thrombocytopenia, unspecified: Secondary | ICD-10-CM | POA: Diagnosis present

## 2022-03-10 DIAGNOSIS — J9601 Acute respiratory failure with hypoxia: Secondary | ICD-10-CM | POA: Diagnosis not present

## 2022-03-10 DIAGNOSIS — N390 Urinary tract infection, site not specified: Secondary | ICD-10-CM | POA: Diagnosis not present

## 2022-03-10 DIAGNOSIS — E782 Mixed hyperlipidemia: Secondary | ICD-10-CM | POA: Diagnosis present

## 2022-03-10 DIAGNOSIS — R55 Syncope and collapse: Secondary | ICD-10-CM | POA: Diagnosis present

## 2022-03-10 DIAGNOSIS — Z9911 Dependence on respirator [ventilator] status: Secondary | ICD-10-CM | POA: Diagnosis not present

## 2022-03-10 DIAGNOSIS — K7460 Unspecified cirrhosis of liver: Secondary | ICD-10-CM | POA: Diagnosis present

## 2022-03-10 DIAGNOSIS — I864 Gastric varices: Secondary | ICD-10-CM | POA: Diagnosis present

## 2022-03-10 DIAGNOSIS — E8809 Other disorders of plasma-protein metabolism, not elsewhere classified: Secondary | ICD-10-CM | POA: Diagnosis present

## 2022-03-10 DIAGNOSIS — I8510 Secondary esophageal varices without bleeding: Secondary | ICD-10-CM | POA: Diagnosis present

## 2022-03-10 DIAGNOSIS — K7290 Hepatic failure, unspecified without coma: Secondary | ICD-10-CM | POA: Diagnosis not present

## 2022-03-10 DIAGNOSIS — I8501 Esophageal varices with bleeding: Secondary | ICD-10-CM | POA: Diagnosis not present

## 2022-03-10 DIAGNOSIS — Z7189 Other specified counseling: Secondary | ICD-10-CM | POA: Diagnosis not present

## 2022-03-10 DIAGNOSIS — I2510 Atherosclerotic heart disease of native coronary artery without angina pectoris: Secondary | ICD-10-CM | POA: Diagnosis present

## 2022-03-10 DIAGNOSIS — K219 Gastro-esophageal reflux disease without esophagitis: Secondary | ICD-10-CM | POA: Diagnosis present

## 2022-03-10 DIAGNOSIS — K766 Portal hypertension: Secondary | ICD-10-CM | POA: Diagnosis present

## 2022-03-10 DIAGNOSIS — E869 Volume depletion, unspecified: Secondary | ICD-10-CM | POA: Diagnosis present

## 2022-03-10 DIAGNOSIS — Z515 Encounter for palliative care: Secondary | ICD-10-CM

## 2022-03-10 DIAGNOSIS — A419 Sepsis, unspecified organism: Secondary | ICD-10-CM | POA: Diagnosis not present

## 2022-03-10 DIAGNOSIS — Z955 Presence of coronary angioplasty implant and graft: Secondary | ICD-10-CM

## 2022-03-10 DIAGNOSIS — J9600 Acute respiratory failure, unspecified whether with hypoxia or hypercapnia: Secondary | ICD-10-CM | POA: Diagnosis not present

## 2022-03-10 DIAGNOSIS — Z833 Family history of diabetes mellitus: Secondary | ICD-10-CM

## 2022-03-10 DIAGNOSIS — F29 Unspecified psychosis not due to a substance or known physiological condition: Secondary | ICD-10-CM | POA: Diagnosis not present

## 2022-03-10 DIAGNOSIS — K767 Hepatorenal syndrome: Secondary | ICD-10-CM | POA: Diagnosis present

## 2022-03-10 DIAGNOSIS — R578 Other shock: Secondary | ICD-10-CM | POA: Diagnosis present

## 2022-03-10 DIAGNOSIS — N183 Chronic kidney disease, stage 3 unspecified: Secondary | ICD-10-CM | POA: Diagnosis present

## 2022-03-10 DIAGNOSIS — T17908A Unspecified foreign body in respiratory tract, part unspecified causing other injury, initial encounter: Secondary | ICD-10-CM | POA: Diagnosis not present

## 2022-03-10 DIAGNOSIS — N1832 Chronic kidney disease, stage 3b: Secondary | ICD-10-CM | POA: Diagnosis present

## 2022-03-10 DIAGNOSIS — I8511 Secondary esophageal varices with bleeding: Secondary | ICD-10-CM | POA: Diagnosis present

## 2022-03-10 DIAGNOSIS — I129 Hypertensive chronic kidney disease with stage 1 through stage 4 chronic kidney disease, or unspecified chronic kidney disease: Secondary | ICD-10-CM | POA: Diagnosis present

## 2022-03-10 DIAGNOSIS — I10 Essential (primary) hypertension: Secondary | ICD-10-CM | POA: Diagnosis present

## 2022-03-10 DIAGNOSIS — R54 Age-related physical debility: Secondary | ICD-10-CM | POA: Diagnosis present

## 2022-03-10 DIAGNOSIS — Z6820 Body mass index (BMI) 20.0-20.9, adult: Secondary | ICD-10-CM

## 2022-03-10 DIAGNOSIS — Z801 Family history of malignant neoplasm of trachea, bronchus and lung: Secondary | ICD-10-CM

## 2022-03-10 DIAGNOSIS — K7469 Other cirrhosis of liver: Secondary | ICD-10-CM | POA: Diagnosis present

## 2022-03-10 DIAGNOSIS — R188 Other ascites: Secondary | ICD-10-CM | POA: Diagnosis present

## 2022-03-10 DIAGNOSIS — K7031 Alcoholic cirrhosis of liver with ascites: Secondary | ICD-10-CM | POA: Diagnosis not present

## 2022-03-10 DIAGNOSIS — Z66 Do not resuscitate: Secondary | ICD-10-CM | POA: Diagnosis not present

## 2022-03-10 DIAGNOSIS — B9689 Other specified bacterial agents as the cause of diseases classified elsewhere: Secondary | ICD-10-CM | POA: Diagnosis not present

## 2022-03-10 DIAGNOSIS — L89621 Pressure ulcer of left heel, stage 1: Secondary | ICD-10-CM | POA: Diagnosis present

## 2022-03-10 DIAGNOSIS — Z7984 Long term (current) use of oral hypoglycemic drugs: Secondary | ICD-10-CM

## 2022-03-10 DIAGNOSIS — Z87891 Personal history of nicotine dependence: Secondary | ICD-10-CM

## 2022-03-10 DIAGNOSIS — L89151 Pressure ulcer of sacral region, stage 1: Secondary | ICD-10-CM | POA: Diagnosis present

## 2022-03-10 DIAGNOSIS — K922 Gastrointestinal hemorrhage, unspecified: Secondary | ICD-10-CM | POA: Diagnosis not present

## 2022-03-10 DIAGNOSIS — E1122 Type 2 diabetes mellitus with diabetic chronic kidney disease: Secondary | ICD-10-CM | POA: Diagnosis present

## 2022-03-10 DIAGNOSIS — D62 Acute posthemorrhagic anemia: Secondary | ICD-10-CM | POA: Diagnosis not present

## 2022-03-10 DIAGNOSIS — H409 Unspecified glaucoma: Secondary | ICD-10-CM | POA: Diagnosis present

## 2022-03-10 DIAGNOSIS — G9341 Metabolic encephalopathy: Secondary | ICD-10-CM | POA: Diagnosis not present

## 2022-03-10 DIAGNOSIS — E1121 Type 2 diabetes mellitus with diabetic nephropathy: Secondary | ICD-10-CM | POA: Diagnosis present

## 2022-03-10 DIAGNOSIS — J690 Pneumonitis due to inhalation of food and vomit: Secondary | ICD-10-CM | POA: Diagnosis not present

## 2022-03-10 DIAGNOSIS — Z8249 Family history of ischemic heart disease and other diseases of the circulatory system: Secondary | ICD-10-CM

## 2022-03-10 DIAGNOSIS — T17908S Unspecified foreign body in respiratory tract, part unspecified causing other injury, sequela: Secondary | ICD-10-CM | POA: Diagnosis not present

## 2022-03-10 DIAGNOSIS — E875 Hyperkalemia: Secondary | ICD-10-CM | POA: Diagnosis present

## 2022-03-10 DIAGNOSIS — K7210 Chronic hepatic failure without coma: Secondary | ICD-10-CM | POA: Diagnosis present

## 2022-03-10 DIAGNOSIS — K3189 Other diseases of stomach and duodenum: Secondary | ICD-10-CM | POA: Diagnosis present

## 2022-03-10 DIAGNOSIS — N179 Acute kidney failure, unspecified: Secondary | ICD-10-CM | POA: Diagnosis present

## 2022-03-10 DIAGNOSIS — Z79899 Other long term (current) drug therapy: Secondary | ICD-10-CM

## 2022-03-10 DIAGNOSIS — Z781 Physical restraint status: Secondary | ICD-10-CM

## 2022-03-10 DIAGNOSIS — T17908D Unspecified foreign body in respiratory tract, part unspecified causing other injury, subsequent encounter: Secondary | ICD-10-CM | POA: Diagnosis not present

## 2022-03-10 DIAGNOSIS — I252 Old myocardial infarction: Secondary | ICD-10-CM

## 2022-03-10 LAB — URINALYSIS, ROUTINE W REFLEX MICROSCOPIC
Bilirubin Urine: NEGATIVE
Glucose, UA: 50 mg/dL — AB
Hgb urine dipstick: NEGATIVE
Ketones, ur: 5 mg/dL — AB
Nitrite: NEGATIVE
Protein, ur: 30 mg/dL — AB
Specific Gravity, Urine: 1.014 (ref 1.005–1.030)
pH: 5 (ref 5.0–8.0)

## 2022-03-10 LAB — CBC
HCT: 30.4 % — ABNORMAL LOW (ref 39.0–52.0)
Hemoglobin: 10.1 g/dL — ABNORMAL LOW (ref 13.0–17.0)
MCH: 32.5 pg (ref 26.0–34.0)
MCHC: 33.2 g/dL (ref 30.0–36.0)
MCV: 97.7 fL (ref 80.0–100.0)
Platelets: 61 10*3/uL — ABNORMAL LOW (ref 150–400)
RBC: 3.11 MIL/uL — ABNORMAL LOW (ref 4.22–5.81)
RDW: 20 % — ABNORMAL HIGH (ref 11.5–15.5)
WBC: 6 10*3/uL (ref 4.0–10.5)
nRBC: 0 % (ref 0.0–0.2)

## 2022-03-10 LAB — COMPREHENSIVE METABOLIC PANEL
ALT: <5 U/L (ref 0–44)
AST: 71 U/L — ABNORMAL HIGH (ref 15–41)
Albumin: 3.1 g/dL — ABNORMAL LOW (ref 3.5–5.0)
Alkaline Phosphatase: 78 U/L (ref 38–126)
Anion gap: 15 (ref 5–15)
BUN: 46 mg/dL — ABNORMAL HIGH (ref 8–23)
CO2: 19 mmol/L — ABNORMAL LOW (ref 22–32)
Calcium: 8.6 mg/dL — ABNORMAL LOW (ref 8.9–10.3)
Chloride: 98 mmol/L (ref 98–111)
Creatinine, Ser: 3.52 mg/dL — ABNORMAL HIGH (ref 0.61–1.24)
GFR, Estimated: 18 mL/min — ABNORMAL LOW (ref >60)
Glucose, Bld: 239 mg/dL — ABNORMAL HIGH (ref 70–99)
Potassium: 5.5 mmol/L — ABNORMAL HIGH (ref 3.5–5.1)
Sodium: 132 mmol/L — ABNORMAL LOW (ref 135–145)
Total Bilirubin: 3.5 mg/dL — ABNORMAL HIGH (ref 0.3–1.2)
Total Protein: 5.9 g/dL — ABNORMAL LOW (ref 6.5–8.1)

## 2022-03-10 LAB — TROPONIN I (HIGH SENSITIVITY): Troponin I (High Sensitivity): 11 ng/L (ref <18)

## 2022-03-10 LAB — PROTIME-INR
INR: 1.5 — ABNORMAL HIGH (ref 0.8–1.2)
Prothrombin Time: 17.7 seconds — ABNORMAL HIGH (ref 11.4–15.2)

## 2022-03-10 LAB — GLUCOSE, CAPILLARY
Glucose-Capillary: 242 mg/dL — ABNORMAL HIGH (ref 70–99)
Glucose-Capillary: 237 mg/dL — ABNORMAL HIGH (ref 70–99)

## 2022-03-10 LAB — AMMONIA: Ammonia: 34 umol/L (ref 9–35)

## 2022-03-10 MED ORDER — TRAZODONE HCL 50 MG PO TABS: 50.0000 mg | ORAL_TABLET | Freq: Every day | ORAL | Status: DC

## 2022-03-10 MED ORDER — ONDANSETRON HCL 4 MG/2ML IJ SOLN
4.0000 mg | Freq: Four times a day (QID) | INTRAMUSCULAR | Status: DC | PRN
  Administered 2022-03-10 – 2022-03-12 (×3): 4 mg via INTRAVENOUS
  Filled 2022-03-10 (×3): qty 2

## 2022-03-10 MED ORDER — INSULIN ASPART 100 UNIT/ML IJ SOLN: 0.0000 [IU] | Freq: Three times a day (TID) | INTRAMUSCULAR | Status: DC

## 2022-03-10 MED ORDER — INSULIN ASPART 100 UNIT/ML IJ SOLN
0.0000 [IU] | Freq: Every day | INTRAMUSCULAR | Status: DC
  Administered 2022-03-10: 2 [IU] via SUBCUTANEOUS

## 2022-03-10 MED ORDER — SODIUM CHLORIDE 0.9 % IV SOLN
50.0000 ug/h | INTRAVENOUS | Status: DC
  Administered 2022-03-10 – 2022-03-14 (×10): 50 ug/h via INTRAVENOUS
  Filled 2022-03-10 (×14): qty 1

## 2022-03-10 MED ORDER — ONDANSETRON HCL 4 MG PO TABS: 4.0000 mg | ORAL_TABLET | Freq: Four times a day (QID) | ORAL | Status: DC | PRN

## 2022-03-10 MED ORDER — ALBUMIN HUMAN 25 % IV SOLN
50.0000 g | Freq: Two times a day (BID) | INTRAVENOUS | Status: DC
  Administered 2022-03-11: 50 g via INTRAVENOUS
  Filled 2022-03-10: qty 200

## 2022-03-10 MED ORDER — SODIUM BICARBONATE 650 MG PO TABS
650.0000 mg | ORAL_TABLET | Freq: Three times a day (TID) | ORAL | Status: DC
  Filled 2022-03-10: qty 1

## 2022-03-10 MED ORDER — ALBUMIN HUMAN 25 % IV SOLN
12.5000 g | Freq: Once | INTRAVENOUS | Status: DC
  Filled 2022-03-10: qty 50

## 2022-03-10 MED ORDER — SODIUM ZIRCONIUM CYCLOSILICATE 5 G PO PACK
5.0000 g | PACK | Freq: Once | ORAL | Status: AC
  Administered 2022-03-10: 5 g via ORAL
  Filled 2022-03-10: qty 1

## 2022-03-10 MED ORDER — PANTOPRAZOLE SODIUM 40 MG IV SOLR
40.0000 mg | Freq: Two times a day (BID) | INTRAVENOUS | Status: DC
  Administered 2022-03-10: 40 mg via INTRAVENOUS
  Filled 2022-03-10: qty 10

## 2022-03-10 MED ORDER — OCTREOTIDE LOAD VIA INFUSION
50.0000 ug | Freq: Once | INTRAVENOUS | Status: AC
  Administered 2022-03-10: 50 ug via INTRAVENOUS
  Filled 2022-03-10: qty 25

## 2022-03-10 MED ORDER — OCTREOTIDE ACETATE 500 MCG/ML IJ SOLN
INTRAMUSCULAR | Status: AC
  Filled 2022-03-10: qty 1

## 2022-03-10 MED ORDER — LACTULOSE 10 GM/15ML PO SOLN: 10.0000 g | Freq: Two times a day (BID) | ORAL | Status: DC

## 2022-03-10 MED ORDER — FUROSEMIDE 40 MG PO TABS
40.0000 mg | ORAL_TABLET | Freq: Every day | ORAL | Status: DC
  Filled 2022-03-10: qty 1

## 2022-03-10 MED ORDER — ALBUMIN HUMAN 25 % IV SOLN
50.0000 g | Freq: Once | INTRAVENOUS | Status: AC
  Administered 2022-03-10: 50 g via INTRAVENOUS
  Filled 2022-03-10 (×2): qty 200

## 2022-03-10 MED ORDER — MIDODRINE HCL 5 MG PO TABS
20.0000 mg | ORAL_TABLET | Freq: Three times a day (TID) | ORAL | Status: DC
  Administered 2022-03-11: 20 mg via ORAL
  Filled 2022-03-10: qty 4

## 2022-03-10 MED ORDER — SODIUM CHLORIDE 0.9 % IV SOLN
1.0000 g | INTRAVENOUS | Status: DC
  Administered 2022-03-10 – 2022-03-11 (×2): 1 g via INTRAVENOUS
  Filled 2022-03-10 (×2): qty 10

## 2022-03-10 MED ORDER — LINAGLIPTIN 5 MG PO TABS
5.0000 mg | ORAL_TABLET | Freq: Every day | ORAL | Status: DC
  Filled 2022-03-10: qty 1

## 2022-03-10 MED ORDER — BRIMONIDINE TARTRATE 0.2 % OP SOLN
1.0000 [drp] | Freq: Two times a day (BID) | OPHTHALMIC | Status: DC
  Administered 2022-03-10 – 2022-03-15 (×10): 1 [drp] via OPHTHALMIC
  Filled 2022-03-10 (×2): qty 5

## 2022-03-10 MED ORDER — SODIUM CHLORIDE 0.9 % IV BOLUS
500.0000 mL | Freq: Once | INTRAVENOUS | Status: AC
  Administered 2022-03-10: 500 mL via INTRAVENOUS

## 2022-03-10 MED ORDER — PANTOPRAZOLE SODIUM 40 MG PO TBEC: 40.0000 mg | DELAYED_RELEASE_TABLET | Freq: Two times a day (BID) | ORAL | Status: DC

## 2022-03-10 MED ORDER — TAMSULOSIN HCL 0.4 MG PO CAPS
0.4000 mg | ORAL_CAPSULE | Freq: Every day | ORAL | Status: DC
  Filled 2022-03-10: qty 1

## 2022-03-10 NOTE — Progress Notes (Signed)
Patient Name: Edward [name], male   DOB: Aug 08, 1950, 71 y.o.  MRN: [id number]  Patient having hematemesis x2.  Patient type and screened already  Transfer to stepdown.  Start octreotide, rocephin 1g, protonix '40mg'$  IV (previously ordered). BP stable.  Truett Mainland, DO

## 2022-03-10 NOTE — ED Provider Notes (Signed)
[facility] EMERGENCY DEPARTMENT Provider Note   CSN: [id number] Arrival date & time: 04/02/2022  1313     History  Chief Complaint  Patient presents with   Loss of Consciousness    Edward [name] is a 71 y.o. male.  Patient presents to the hospital via EMS complaining of generalized weakness and possible need for paracentesis.  Patient was on the way to the hospital with his wife.  His wife states that there was a syncopal episode and the patient was unresponsive for a brief period of time.  She called EMS.  Upon EMS arrival the patient was back to baseline.  The patient denies the syncopal episode.  The patient states that on Halloween he had his most recent paracentesis.  Brief chart review shows ultrasound-guided paracentesis with approximately 5 L of yellow fluid removed.  The patient is reporting more frequent needs for paracentesis over the past month.  He states that due to liver failure and kidney disease he has been given approximately 3 to 5 months to live.  He is endorsing shortness of breath, abdominal discomfort, nausea, and one episode of coughing up what appeared to be blood.  He is denying chest pain, emesis at this time.  Past medical history significant for cirrhosis of liver with ascites, history of bleeding esophageal varices, chronic cholecystitis, type 2 diabetes with nephropathy, GERD, hypertension, CKD stage IIIb, CAD, history MI  HPI     Home Medications Prior to Admission medications   Medication Sig Start Date End Date Taking? Authorizing Provider  albuterol (VENTOLIN HFA) 108 (90 Base) MCG/ACT inhaler Inhale 1-2 puffs into the lungs every 6 (six) hours as needed for wheezing or shortness of breath. 01/23/22   Triplett, Tammy, PA-C  brimonidine (ALPHAGAN) 0.2 % ophthalmic solution Place 1 drop into both eyes 2 (two) times daily. 01/09/22   [provider]  furosemide (LASIX) 40 MG tablet Take 1 tablet by mouth once daily 03/09/22   Hassell Done, Mary-Margaret, FNP   lactulose (CHRONULAC) 10 GM/15ML solution Take 15 mLs (10 g total) by mouth 2 (two) times daily. 02/09/22   Evelina Dun A, FNP  linagliptin (TRADJENTA) 5 MG TABS tablet Take 1 tablet (5 mg total) by mouth daily. 02/09/22   Sharion Balloon, FNP  midodrine (PROAMATINE) 10 MG tablet Take 2 tablets (20 mg total) by mouth 3 (three) times daily with meals. 01/12/22   Hassell Done, Mary-Margaret, FNP  nitroGLYCERIN (NITROSTAT) 0.4 MG SL tablet Place 1 tablet (0.4 mg total) under the tongue every 5 (five) minutes x 3 doses as needed for chest pain (if no relief after 2nd dose, proceed to ED or call 911). 02/19/22   Satira Sark, MD  pantoprazole (PROTONIX) 40 MG tablet Take 1 tablet (40 mg total) by mouth 2 (two) times daily. 02/01/22 02/01/23  Eloise Harman, DO  potassium chloride (KLOR-CON) 10 MEQ tablet Take 1 tablet by mouth once daily 03/09/22   Hassell Done, Mary-Margaret, FNP  sodium bicarbonate 650 MG tablet Take 1 tablet (650 mg total) by mouth 3 (three) times daily. 11/22/21   Barton Dubois, MD  tamsulosin (FLOMAX) 0.4 MG CAPS capsule TAKE 1 CAPSULE BY MOUTH ONCE DAILY AFTER SUPPER 03/09/22   Hassell Done, Mary-Margaret, FNP  traZODone (DESYREL) 50 MG tablet Take 1 tablet (50 mg total) by mouth at bedtime. 02/05/22   [name], FNP      Allergies    Patient has no known allergies.    Review of Systems   Review of Systems  Respiratory:  Positive for shortness of breath.   Cardiovascular:  Negative for chest pain and leg swelling.  Gastrointestinal:  Positive for abdominal distention and nausea. Negative for abdominal pain and vomiting.  Neurological:  Positive for weakness.    Physical Exam Updated Vital Signs BP 101/66   Pulse 97   Temp 98.1 F (36.7 C) (Oral)   Resp (!) 37   Ht '5\' 10"'$  (1.778 m)   Wt 65.8 kg   SpO2 100%   BMI 20.81 kg/m  Physical Exam Vitals and nursing note reviewed.  Constitutional:      General: He is not in acute distress.    Appearance: He is  well-developed.  HENT:     Head: Normocephalic and atraumatic.  Eyes:     Conjunctiva/sclera: Conjunctivae normal.  Cardiovascular:     Rate and Rhythm: Normal rate and regular rhythm.     Heart sounds: No murmur heard. Pulmonary:     Effort: Pulmonary effort is normal. No respiratory distress.     Breath sounds: Normal breath sounds.  Abdominal:     General: There is distension.     Palpations: Abdomen is soft.     Tenderness: There is no abdominal tenderness.  Musculoskeletal:        General: No swelling.     Cervical back: Neck supple.  Skin:    General: Skin is warm and dry.     Capillary Refill: Capillary refill takes less than 2 seconds.  Neurological:     Mental Status: He is alert.  Psychiatric:        Mood and Affect: Mood normal.     ED Results / Procedures / Treatments   Labs (all labs ordered are listed, but only abnormal results are displayed) Labs Reviewed  CBC - Abnormal; Notable for the following components:      Result Value   RBC 3.11 (*)    Hemoglobin 10.1 (*)    HCT 30.4 (*)    RDW 20.0 (*)    Platelets 61 (*)    All other components within normal limits  COMPREHENSIVE METABOLIC PANEL - Abnormal; Notable for the following components:   Sodium 132 (*)    Potassium 5.5 (*)    CO2 19 (*)    Glucose, Bld 239 (*)    BUN 46 (*)    Creatinine, Ser 3.52 (*)    Calcium 8.6 (*)    Total Protein 5.9 (*)    Albumin 3.1 (*)    AST 71 (*)    Total Bilirubin 3.5 (*)    GFR, Estimated 18 (*)    All other components within normal limits  PROTIME-INR - Abnormal; Notable for the following components:   Prothrombin Time 17.7 (*)    INR 1.5 (*)    All other components within normal limits  AMMONIA  URINALYSIS, ROUTINE W REFLEX MICROSCOPIC  TYPE AND SCREEN  TROPONIN I (HIGH SENSITIVITY)  TROPONIN I (HIGH SENSITIVITY)    EKG EKG Interpretation  Date/Time:  Saturday March 10 2022 13:26:11 EDT Ventricular Rate:  101 PR Interval:  158 QRS  Duration: 80 QT Interval:  368 QTC Calculation: 477 R Axis:   86 Text Interpretation: Sinus tachycardia Borderline right axis deviation Low voltage, extremity leads Probable anteroseptal infarct, old Since last tracing rate faster Confirmed by Isla Pence [phone]) on 03/26/2022 1:59:27 PM  Radiology DG Chest Portable 1 View  Result Date: 03/27/2022 CLINICAL DATA:  Shortness of breath. Syncope. EXAM: PORTABLE CHEST 1 VIEW COMPARISON:  02/21/2022 FINDINGS: Normal sized heart. Poor inspiration. Minimal linear atelectasis/scarring at both lung bases. Otherwise, clear lungs with normal vascularity. Thoracic spine degenerative changes. IMPRESSION: Poor inspiration with minimal bibasilar linear atelectasis/scarring. Electronically Signed   By: Claudie Revering M.D.   On: 03/09/2022 14:01    Procedures Procedures    Medications Ordered in ED Medications  sodium chloride 0.9 % bolus 500 mL (has no administration in time range)    ED Course/ Medical Decision Making/ A&P                           Medical Decision Making Amount and/or Complexity of Data Reviewed Labs: ordered. Radiology: ordered.   This patient presents to the ED for concern of syncopal episode and weakness, this involves an extensive number of treatment options, and is a complaint that carries with it a high risk of complications and morbidity.  The differential diagnosis includes anemia, deconditioning, dehydration, dysrhythmia, and others   Co morbidities that complicate the patient evaluation  History of esophageal varices, cirrhosis of liver with ascites, previous MI, chronic kidney disease   Additional history obtained:  Additional history obtained from EMS External records from outside source obtained and reviewed including discharge summary from recent admission due to hematemesis   Lab Tests:  I Ordered, and personally interpreted labs.  The pertinent results include:  Creatinine 3.52 (2.26 9 days ago),  hemoglobin 10.1 (improved from last result), troponin 11, ammonia 34, prothrombin time 17.7, INR 1.5   Imaging Studies ordered:  I ordered imaging studies including chest x-ray I independently visualized and interpreted imaging which showed Poor inspiration with minimal bibasilar linear atelectasis/scarring  I agree with the radiologist interpretation   Cardiac Monitoring: / EKG:  The patient was maintained on a cardiac monitor.  I personally viewed and interpreted the cardiac monitored which showed an underlying rhythm of: Sinus tachycardia   Consultations Obtained:  I requested consultation with the hospitalist, Dr.Stinson and discussed lab and imaging findings as well as pertinent plan - they recommend: Plan to see patient for possible admission   Problem List / ED Course / Critical interventions / Medication management  Syncope, AKI I ordered medication including saline bolus for fluid resuscitation Reevaluation of the patient after these medicines showed that the patient stayed the same I have reviewed the patients home medicines and have made adjustments as needed   Social Determinants of Health:  Patient lives at home with his wife   Test / Admission - Considered:  The patient had a syncopal episode and has an acute kidney injury on top of chronic kidney disease.  Patient would benefit from admission for controlled fluid administration and further evaluation of the syncopal episode.  The patient also appears to have significant ascites and will likely need a paracentesis in the near future.        Final Clinical Impression(s) / ED Diagnoses Final diagnoses:  Syncope, unspecified syncope type  AKI (acute kidney injury) ([place])  Other ascites    Rx / DC Orders ED Discharge Orders     None         Ronny Bacon 04/01/2022 1554    Isla Pence, MD 03/13/2022 [phone]

## 2022-03-10 NOTE — Progress Notes (Signed)
Nurse tech called this nurse and stated patient had blood in sputum. This nurse went to patients room upon entering patient was on bedside commode. Patient had spit into basin, this nurse assessed blood tinged sputum. After patient was done using bedside commode this nurse and nurse tech cleaned patient up. Upon cleaning patient up. Patients  stool appeared to have blood. water was mixed into bedside commode bucket unable to collect sample. Patient transferred   back to bed and MD made aware of situation. No new orders at this time.

## 2022-03-10 NOTE — Progress Notes (Signed)
Patient vomit small amount of blood tinged sputum x1.

## 2022-03-10 NOTE — ED Triage Notes (Signed)
Pt via EMS from home after loss of consciousness and a period of unresponsiveness. Extensive medical hx with life expectancy of 4-6 months and was vomiting blood earlier today per pt and wife report. No emesis en route with EMS. Hypotensive on scene at 97/58.   BP 97/58 HR 100 CBG 294  A/O on arrival, ambulatory with assistance.

## 2022-03-10 NOTE — ED Notes (Signed)
Pt is refusing labs and IV start. Counseling provided and ED provider notified.

## 2022-03-10 NOTE — H&P (Signed)
History and Physical    Patient: Edward [name] [facility] QIO:[id number] DOB: 1950-09-02 DOA: 03/13/2022 DOS: the patient was seen and examined on 03/31/2022 PCP: [name], FNP  Patient coming from: Home  Chief Complaint:  Chief Complaint  Patient presents with   Loss of Consciousness   HPI: Edward [name] is a 71 y.o. male with medical history significant of decompensated cirrhosis of the liver with ascites and esophageal varices, type 2 diabetes, stage III chronic kidney disease, coronary artery disease with stents, hypertension, history of MI, hyperlipidemia.  Patient brought to the hospital due to spitting up blood.  Patient coughed up some blood liquid earlier today.  This happened a single time.  Due to concerns, his wife called EMS and the patient was brought to the hospital for evaluation.  Patient has been hospitalized recently for extensive esophageal variceal bleeding requiring multiple units of blood transfusion and admission to the ICU.  The patient's has not had any more hematemesis or hematochezia.  He does have extensive ascites and he states that his abdomen feels tight.  Initially, he was refusing blood work, however blood work shows an elevated creatinine of 3.5.  He was discharged from the hospital on 10/26 with a creatinine of 2.26.  Patient has not had much to drink today.  Of note, when called by the EDP, the EDP stated that the reason for presentation to the hospital was a syncopal episode.  Both the patient and his wife deny a syncopal episode earlier today or yesterday.  Patient denies chest pain, difficulty breathing.  Review of Systems: As mentioned in the history of present illness. All other systems reviewed and are negative. Past Medical History:  Diagnosis Date   Cataract    CKD (chronic kidney disease) stage 3, GFR 30-59 ml/min (HCC)    Coronary atherosclerosis of native coronary artery    BMS LAD 2006; residual 75% distal RCA; EF 50%   Essential  hypertension    Glaucoma    Low back pain    MI (myocardial infarction) ([place])    Anterior 2006   Mixed hyperlipidemia    Renal insufficiency    Type 2 diabetes mellitus ([place])    Past Surgical History:  Procedure Laterality Date   CATARACT EXTRACTION, BILATERAL     CORONARY ANGIOPLASTY WITH STENT PLACEMENT  2006   ESOPHAGEAL BANDING  02/01/2022   Procedure: ESOPHAGEAL BANDING;  Surgeon: Eloise Harman, DO;  Location: [facility];  Service: Endoscopy;;   ESOPHAGEAL BANDING  02/20/2022   Procedure: ESOPHAGEAL BANDING;  Surgeon: Ladene Artist, MD;  Location: [facility] ENDOSCOPY;  Service: Gastroenterology;;   ESOPHAGOGASTRODUODENOSCOPY (EGD) WITH PROPOFOL N/A 02/01/2022   Procedure: ESOPHAGOGASTRODUODENOSCOPY (EGD) WITH PROPOFOL;  Surgeon: Eloise Harman, DO;  Location: [facility];  Service: Endoscopy;  Laterality: N/A;  12:30 am   ESOPHAGOGASTRODUODENOSCOPY (EGD) WITH PROPOFOL N/A 02/20/2022   Procedure: ESOPHAGOGASTRODUODENOSCOPY (EGD) WITH PROPOFOL;  Surgeon: Ladene Artist, MD;  Location[facility] ENDOSCOPY;  Service: Gastroenterology;  Laterality: N/A;   IR PARACENTESIS  02/27/2022   Social History:  reports that he quit smoking about 34 years ago. His smoking use included cigarettes. He has a 40.00 pack-year smoking history. He has been exposed to tobacco smoke. He has never used smokeless tobacco. He reports that he does not drink alcohol and does not use drugs.  No Known Allergies  Family History  Problem Relation Age of Onset   Aneurysm Father        Brain aneurysm  Lung cancer Mother        Small cell carcinoma of lung,kidney and heart   Heart disease Mother    Cancer Brother        lungs   Diabetes Sister    Diabetes Sister    Diabetes Brother    Diabetes Brother     Prior to Admission medications   Medication Sig Start Date End Date Taking? Authorizing Provider  albuterol (VENTOLIN HFA) 108 (90 Base) MCG/ACT inhaler Inhale 1-2 puffs into the lungs every 6 (six)  hours as needed for wheezing or shortness of breath. 01/23/22   Triplett, Tammy, PA-C  brimonidine (ALPHAGAN) 0.2 % ophthalmic solution Place 1 drop into both eyes 2 (two) times daily. 01/09/22   [provider]  furosemide (LASIX) 40 MG tablet Take 1 tablet by mouth once daily 03/09/22   Hassell Done, Mary-Margaret, FNP  lactulose (CHRONULAC) 10 GM/15ML solution Take 15 mLs (10 g total) by mouth 2 (two) times daily. 02/09/22   Evelina Dun A, FNP  linagliptin (TRADJENTA) 5 MG TABS tablet Take 1 tablet (5 mg total) by mouth daily. 02/09/22   Sharion Balloon, FNP  midodrine (PROAMATINE) 10 MG tablet Take 2 tablets (20 mg total) by mouth 3 (three) times daily with meals. 01/12/22   Hassell Done, Mary-Margaret, FNP  nitroGLYCERIN (NITROSTAT) 0.4 MG SL tablet Place 1 tablet (0.4 mg total) under the tongue every 5 (five) minutes x 3 doses as needed for chest pain (if no relief after 2nd dose, proceed to ED or call 911). 02/19/22   Satira Sark, MD  pantoprazole (PROTONIX) 40 MG tablet Take 1 tablet (40 mg total) by mouth 2 (two) times daily. 02/01/22 02/01/23  Eloise Harman, DO  potassium chloride (KLOR-CON) 10 MEQ tablet Take 1 tablet by mouth once daily 03/09/22   Hassell Done, Mary-Margaret, FNP  sodium bicarbonate 650 MG tablet Take 1 tablet (650 mg total) by mouth 3 (three) times daily. 11/22/21   Barton Dubois, MD  tamsulosin (FLOMAX) 0.4 MG CAPS capsule TAKE 1 CAPSULE BY MOUTH ONCE DAILY AFTER SUPPER 03/09/22   Hassell Done, Mary-Margaret, FNP  traZODone (DESYREL) 50 MG tablet Take 1 tablet (50 mg total) by mouth at bedtime. 02/05/22   [name], FNP    Physical Exam: Vitals:   03/13/2022 1326 03/28/2022 1330 03/25/2022 1600 03/20/2022 1630  BP: 108/67 101/66 99/64 (!) 110/55  Pulse: 99 97 99 89  Resp: (!) 21 (!) 37 (!) 30 19  Temp: 98.1 F (36.7 C)     TempSrc: Oral     SpO2: 100% 100% 100% 100%  Weight:      Height:       General: Elderly male. Awake and alert and oriented x3. No acute  cardiopulmonary distress.  HEENT: Normocephalic atraumatic.  Right and left ears normal in appearance.  Pupils equal, round, reactive to light. Extraocular muscles are intact. Sclerae anicteric and noninjected.  Moist mucosal membranes. No mucosal lesions.  Neck: Neck supple without lymphadenopathy. No carotid bruits. No masses palpated.  Cardiovascular: Regular rate with normal S1-S2 sounds. No murmurs, rubs, gallops auscultated. No JVD.  Respiratory: Good respiratory effort with no wheezes, rales, rhonchi. Lungs clear to auscultation bilaterally.  No accessory muscle use. Abdomen: Abdomen tight and hypertympanitic.  There is diffuse tenderness.  There is an obvious fluid wave in the abdomen.  Unable to palpate liver margin Skin: No rashes, lesions, or ulcerations.  Dry, warm to touch. 2+ dorsalis pedis and radial pulses. Musculoskeletal: No calf or leg pain.  All major joints not erythematous nontender.  No upper or lower joint deformation.  Good ROM.  No contractures  Psychiatric: Intact judgment and insight. Pleasant and cooperative. Neurologic: No focal neurological deficits. Strength is 5/5 and symmetric in upper and lower extremities.  Cranial nerves II through XII are grossly intact.  Data Reviewed: Results for orders placed or performed during the hospital encounter of 04/04/2022 (from the past 24 hour(s))  CBC     Status: Abnormal   Collection Time: 03/22/2022  2:27 PM  Result Value Ref Range   WBC 6.0 4.0 - 10.5 K/uL   RBC 3.11 (L) 4.22 - 5.81 MIL/uL   Hemoglobin 10.1 (L) 13.0 - 17.0 g/dL   HCT 30.4 (L) 39.0 - 52.0 %   MCV 97.7 80.0 - 100.0 fL   MCH 32.5 26.0 - 34.0 pg   MCHC 33.2 30.0 - 36.0 g/dL   RDW 20.0 (H) 11.5 - 15.5 %   Platelets 61 (L) 150 - 400 K/uL   nRBC 0.0 0.0 - 0.2 %  Comprehensive metabolic panel     Status: Abnormal   Collection Time: 03/27/2022  2:27 PM  Result Value Ref Range   Sodium 132 (L) 135 - 145 mmol/L   Potassium 5.5 (H) 3.5 - 5.1 mmol/L   Chloride 98 98  - 111 mmol/L   CO2 19 (L) 22 - 32 mmol/L   Glucose, Bld 239 (H) 70 - 99 mg/dL   BUN 46 (H) 8 - 23 mg/dL   Creatinine, Ser 3.52 (H) 0.61 - 1.24 mg/dL   Calcium 8.6 (L) 8.9 - 10.3 mg/dL   Total Protein 5.9 (L) 6.5 - 8.1 g/dL   Albumin 3.1 (L) 3.5 - 5.0 g/dL   AST 71 (H) 15 - 41 U/L   ALT <5 0 - 44 U/L   Alkaline Phosphatase 78 38 - 126 U/L   Total Bilirubin 3.5 (H) 0.3 - 1.2 mg/dL   GFR, Estimated 18 (L) >60 mL/min   Anion gap 15 5 - 15  Ammonia     Status: None   Collection Time: 03/27/2022  2:27 PM  Result Value Ref Range   Ammonia 34 9 - 35 umol/L  Protime-INR     Status: Abnormal   Collection Time: 03/25/2022  2:27 PM  Result Value Ref Range   Prothrombin Time 17.7 (H) 11.4 - 15.2 seconds   INR 1.5 (H) 0.8 - 1.2  Troponin I (High Sensitivity)     Status: None   Collection Time: 03/19/2022  2:27 PM  Result Value Ref Range   Troponin I (High Sensitivity) 11 <18 ng/L  Type and screen [facility]     Status: None   Collection Time: 03/27/2022  2:27 PM  Result Value Ref Range   ABO/RH(D) AB POS    Antibody Screen NEG    Sample Expiration      03/13/2022,2359 Performed at [facility], [address], [place], [place] [address]   Urinalysis, Routine w reflex microscopic Urine, Clean Catch     Status: Abnormal   Collection Time: 03/12/2022  3:52 PM  Result Value Ref Range   Color, Urine AMBER (A) YELLOW   APPearance HAZY (A) CLEAR   Specific Gravity, Urine 1.014 1.005 - 1.030   pH 5.0 5.0 - 8.0   Glucose, UA 50 (A) NEGATIVE mg/dL   Hgb urine dipstick NEGATIVE NEGATIVE   Bilirubin Urine NEGATIVE NEGATIVE   Ketones, ur 5 (A) NEGATIVE mg/dL   Protein, ur 30 (A) NEGATIVE  mg/dL   Nitrite NEGATIVE NEGATIVE   Leukocytes,Ua MODERATE (A) NEGATIVE   RBC / HPF 0-5 0 - 5 RBC/hpf   WBC, UA 21-50 0 - 5 WBC/hpf   Bacteria, UA MANY (A) NONE SEEN   Squamous Epithelial / LPF 0-5 0 - 5   WBC Clumps PRESENT    Mucus PRESENT    Hyaline Casts, UA PRESENT    Non Squamous Epithelial 0-5 (A)  NONE SEEN   DG Chest Portable 1 View  Result Date: 03/09/2022 CLINICAL DATA:  Shortness of breath. Syncope. EXAM: PORTABLE CHEST 1 VIEW COMPARISON:  02/21/2022 FINDINGS: Normal sized heart. Poor inspiration. Minimal linear atelectasis/scarring at both lung bases. Otherwise, clear lungs with normal vascularity. Thoracic spine degenerative changes. IMPRESSION: Poor inspiration with minimal bibasilar linear atelectasis/scarring. Electronically Signed   By: Claudie Revering M.D.   On: 03/09/2022 14:01     Assessment and Plan: No notes have been filed under this hospital service. Service: Hospitalist  Principal Problem:   AKI (acute kidney injury) ([place]) Active Problems:   Decompensated hepatic cirrhosis (HCC)   CKD (chronic kidney disease) stage 3B, GFR 30-59 ml/min (HCC)   Thrombocytopenia (HCC)   Essential hypertension   Type 2 diabetes with nephropathy (HCC)   Hypoalbuminemia   Secondary esophageal varices without bleeding (HCC)   Cirrhosis of liver with ascites (HCC)  Acute kidney injury on chronic kidney disease This likely represents hepatorenal syndrome due to worsening ascites and liver function I had a very frank conversation with the patient and his wife.  At this point there is no reversal of the patient's liver disease and nothing short of a liver and kidney transplant would help him resolve the current issues.  At this point, everything that we do would be palliative. The patient's increasing creatinine is secondary to ascites with increasing intra-abdominal pressure.  We will try to temporize this with albumin 100 g daily for the next couple of days. Will arrange for paracentesis on Monday. Decompensated hepatic cirrhosis with esophageal varices and hypoalbuminemia MELD score of 30.  I did discuss with him that there is a 50% mortality rate over the next 3 months. Continue with lactulose Hyperkalemia Secondary to worsening renal function. Hold potassium We will give Lasix and  Lokelma I discussed potentially getting lab work tomorrow, but patient declined.  He might allow blood work on Monday prior to paracentesis Diabetes Sliding scale insulin Hypertension with coronary artery disease Not currently on medication Thrombocytopenia   Advance Care Planning:   Code Status: Prior I discussed CODE STATUS with the patient.  He would like to be full code at this point.    Consults: Consult GI  Family Communication: Wife present  Severity of Illness: The appropriate patient status for this patient is INPATIENT. Inpatient status is judged to be reasonable and necessary in order to provide the required intensity of service to ensure the patient's safety. The patient's presenting symptoms, physical exam findings, and initial radiographic and laboratory data in the context of their chronic comorbidities is felt to place them at high risk for further clinical deterioration. Furthermore, it is not anticipated that the patient will be medically stable for discharge from the hospital within 2 midnights of admission.   * I certify that at the point of admission it is my clinical judgment that the patient will require inpatient hospital care spanning beyond 2 midnights from the point of admission due to high intensity of service, high risk for further deterioration and high frequency of surveillance  required.*  Author: Truett Mainland, DO 04/04/2022 4:48 PM  For on call review [email or web address].

## 2022-03-11 ENCOUNTER — Inpatient Hospital Stay (HOSPITAL_COMMUNITY): Payer: 59 | Admitting: Anesthesiology

## 2022-03-11 ENCOUNTER — Inpatient Hospital Stay (HOSPITAL_COMMUNITY): Payer: 59

## 2022-03-11 ENCOUNTER — Encounter (HOSPITAL_COMMUNITY): Payer: Self-pay | Admitting: Internal Medicine

## 2022-03-11 ENCOUNTER — Encounter (HOSPITAL_COMMUNITY): Admission: EM | Disposition: E | Payer: Self-pay | Source: Home / Self Care | Attending: Internal Medicine

## 2022-03-11 DIAGNOSIS — R578 Other shock: Secondary | ICD-10-CM

## 2022-03-11 DIAGNOSIS — K7460 Unspecified cirrhosis of liver: Secondary | ICD-10-CM

## 2022-03-11 DIAGNOSIS — K7290 Hepatic failure, unspecified without coma: Secondary | ICD-10-CM | POA: Diagnosis not present

## 2022-03-11 DIAGNOSIS — D696 Thrombocytopenia, unspecified: Secondary | ICD-10-CM

## 2022-03-11 DIAGNOSIS — R188 Other ascites: Secondary | ICD-10-CM

## 2022-03-11 DIAGNOSIS — N179 Acute kidney failure, unspecified: Secondary | ICD-10-CM

## 2022-03-11 DIAGNOSIS — N1832 Chronic kidney disease, stage 3b: Secondary | ICD-10-CM

## 2022-03-11 HISTORY — PX: ESOPHAGOGASTRODUODENOSCOPY (EGD) WITH PROPOFOL: SHX5813

## 2022-03-11 HISTORY — PX: ESOPHAGEAL BANDING: SHX5518

## 2022-03-11 LAB — PREPARE RBC (CROSSMATCH)

## 2022-03-11 LAB — BLOOD GAS, ARTERIAL
Acid-Base Excess: 0.9 mmol/L (ref 0.0–2.0)
Bicarbonate: 23.8 mmol/L (ref 20.0–28.0)
Drawn by: 442
FIO2: 40 %
O2 Saturation: 92.3 %
Patient temperature: 37
pCO2 arterial: 32 mmHg (ref 32–48)
pH, Arterial: 7.48 — ABNORMAL HIGH (ref 7.35–7.45)
pO2, Arterial: 58 mmHg — ABNORMAL LOW (ref 83–108)

## 2022-03-11 LAB — CBC
HCT: 18.8 % — ABNORMAL LOW (ref 39.0–52.0)
Hemoglobin: 6.1 g/dL — CL (ref 13.0–17.0)
MCH: 32.6 pg (ref 26.0–34.0)
MCHC: 32.4 g/dL (ref 30.0–36.0)
MCV: 100.5 fL — ABNORMAL HIGH (ref 80.0–100.0)
Platelets: 56 10*3/uL — ABNORMAL LOW (ref 150–400)
RBC: 1.87 MIL/uL — ABNORMAL LOW (ref 4.22–5.81)
RDW: 20.4 % — ABNORMAL HIGH (ref 11.5–15.5)
WBC: 6.2 10*3/uL (ref 4.0–10.5)
nRBC: 0 % (ref 0.0–0.2)

## 2022-03-11 LAB — COMPREHENSIVE METABOLIC PANEL
ALT: 32 U/L (ref 0–44)
AST: 42 U/L — ABNORMAL HIGH (ref 15–41)
Albumin: 3.8 g/dL (ref 3.5–5.0)
Alkaline Phosphatase: 42 U/L (ref 38–126)
Anion gap: 18 — ABNORMAL HIGH (ref 5–15)
BUN: 64 mg/dL — ABNORMAL HIGH (ref 8–23)
CO2: 16 mmol/L — ABNORMAL LOW (ref 22–32)
Calcium: 8.6 mg/dL — ABNORMAL LOW (ref 8.9–10.3)
Chloride: 100 mmol/L (ref 98–111)
Creatinine, Ser: 3.99 mg/dL — ABNORMAL HIGH (ref 0.61–1.24)
GFR, Estimated: 15 mL/min — ABNORMAL LOW (ref >60)
Glucose, Bld: 226 mg/dL — ABNORMAL HIGH (ref 70–99)
Potassium: 5.3 mmol/L — ABNORMAL HIGH (ref 3.5–5.1)
Sodium: 134 mmol/L — ABNORMAL LOW (ref 135–145)
Total Bilirubin: 3.5 mg/dL — ABNORMAL HIGH (ref 0.3–1.2)
Total Protein: 5.4 g/dL — ABNORMAL LOW (ref 6.5–8.1)

## 2022-03-11 LAB — IRON AND TIBC
Iron: 129 ug/dL (ref 45–182)
Saturation Ratios: 97 % — ABNORMAL HIGH (ref 17.9–39.5)
TIBC: 133 ug/dL — ABNORMAL LOW (ref 250–450)
UIBC: 4 ug/dL

## 2022-03-11 LAB — HEMOGLOBIN AND HEMATOCRIT, BLOOD
HCT: 25.4 % — ABNORMAL LOW (ref 39.0–52.0)
HCT: 23.9 % — ABNORMAL LOW (ref 39.0–52.0)
Hemoglobin: 9 g/dL — ABNORMAL LOW (ref 13.0–17.0)
Hemoglobin: 8.3 g/dL — ABNORMAL LOW (ref 13.0–17.0)

## 2022-03-11 LAB — URINALYSIS, COMPLETE (UACMP) WITH MICROSCOPIC
Bilirubin Urine: NEGATIVE
Glucose, UA: 150 mg/dL — AB
Hgb urine dipstick: NEGATIVE
Ketones, ur: NEGATIVE mg/dL
Nitrite: NEGATIVE
Protein, ur: NEGATIVE mg/dL
Specific Gravity, Urine: 1.011 (ref 1.005–1.030)
pH: 5 (ref 5.0–8.0)

## 2022-03-11 LAB — AMMONIA: Ammonia: 60 umol/L — ABNORMAL HIGH (ref 9–35)

## 2022-03-11 LAB — PROTIME-INR
INR: 2.1 — ABNORMAL HIGH (ref 0.8–1.2)
Prothrombin Time: 23.2 seconds — ABNORMAL HIGH (ref 11.4–15.2)

## 2022-03-11 LAB — GLUCOSE, CAPILLARY
Glucose-Capillary: 200 mg/dL — ABNORMAL HIGH (ref 70–99)
Glucose-Capillary: 202 mg/dL — ABNORMAL HIGH (ref 70–99)
Glucose-Capillary: 216 mg/dL — ABNORMAL HIGH (ref 70–99)
Glucose-Capillary: 273 mg/dL — ABNORMAL HIGH (ref 70–99)
Glucose-Capillary: 263 mg/dL — ABNORMAL HIGH (ref 70–99)

## 2022-03-11 LAB — CULTURE, BODY FLUID W GRAM STAIN -BOTTLE: Culture: NO GROWTH

## 2022-03-11 LAB — HEMOGLOBIN A1C
Hgb A1c MFr Bld: 5.3 % (ref 4.8–5.6)
Mean Plasma Glucose: 105.41 mg/dL

## 2022-03-11 LAB — FERRITIN: Ferritin: 108 ng/mL (ref 24–336)

## 2022-03-11 LAB — FOLATE: Folate: 8.3 ng/mL (ref >5.9)

## 2022-03-11 LAB — BRAIN NATRIURETIC PEPTIDE: B Natriuretic Peptide: 535 pg/mL — ABNORMAL HIGH (ref 0.0–100.0)

## 2022-03-11 LAB — POTASSIUM: Potassium: 4.9 mmol/L (ref 3.5–5.1)

## 2022-03-11 LAB — VITAMIN B12: Vitamin B-12: 1540 pg/mL — ABNORMAL HIGH (ref 180–914)

## 2022-03-11 LAB — PROCALCITONIN: Procalcitonin: 0.77 ng/mL

## 2022-03-11 LAB — FIBRINOGEN: Fibrinogen: 146 mg/dL — ABNORMAL LOW (ref 210–475)

## 2022-03-11 LAB — MRSA NEXT GEN BY PCR, NASAL: MRSA by PCR Next Gen: NOT DETECTED

## 2022-03-11 SURGERY — ESOPHAGOGASTRODUODENOSCOPY (EGD) WITH PROPOFOL
Anesthesia: General

## 2022-03-11 MED ORDER — OCTREOTIDE ACETATE 500 MCG/ML IJ SOLN
INTRAMUSCULAR | Status: AC
  Filled 2022-03-11: qty 1

## 2022-03-11 MED ORDER — ONDANSETRON HCL 4 MG/2ML IJ SOLN
INTRAMUSCULAR | Status: AC
  Filled 2022-03-11: qty 2

## 2022-03-11 MED ORDER — NOREPINEPHRINE 4 MG/250ML-% IV SOLN
2.0000 ug/min | INTRAVENOUS | Status: DC
  Administered 2022-03-11: 3 ug/min via INTRAVENOUS
  Administered 2022-03-11: 8 ug/min via INTRAVENOUS
  Filled 2022-03-11: qty 250

## 2022-03-11 MED ORDER — POLYETHYLENE GLYCOL 3350 17 G PO PACK: 17.0000 g | PACK | Freq: Every day | ORAL | Status: DC

## 2022-03-11 MED ORDER — PANTOPRAZOLE INFUSION (NEW) - SIMPLE MED
8.0000 mg/h | INTRAVENOUS | Status: AC
  Administered 2022-03-11 – 2022-03-14 (×8): 8 mg/h via INTRAVENOUS
  Filled 2022-03-11 (×2): qty 80
  Filled 2022-03-11: qty 100
  Filled 2022-03-11 (×5): qty 80

## 2022-03-11 MED ORDER — SODIUM CHLORIDE 0.9% IV SOLUTION: Freq: Once | INTRAVENOUS | Status: AC

## 2022-03-11 MED ORDER — ORAL CARE MOUTH RINSE
15.0000 mL | OROMUCOSAL | Status: DC
  Administered 2022-03-11 – 2022-03-12 (×9): 15 mL via OROMUCOSAL

## 2022-03-11 MED ORDER — INSULIN ASPART 100 UNIT/ML IJ SOLN
0.0000 [IU] | INTRAMUSCULAR | Status: DC
  Administered 2022-03-11: 2 [IU] via SUBCUTANEOUS
  Administered 2022-03-11 (×2): 3 [IU] via SUBCUTANEOUS
  Administered 2022-03-12: 2 [IU] via SUBCUTANEOUS
  Administered 2022-03-12: 3 [IU] via SUBCUTANEOUS
  Administered 2022-03-12 – 2022-03-13 (×6): 2 [IU] via SUBCUTANEOUS
  Administered 2022-03-13: 3 [IU] via SUBCUTANEOUS
  Administered 2022-03-13 (×2): 1 [IU] via SUBCUTANEOUS
  Administered 2022-03-13: 2 [IU] via SUBCUTANEOUS
  Administered 2022-03-14 – 2022-03-15 (×6): 1 [IU] via SUBCUTANEOUS

## 2022-03-11 MED ORDER — FENTANYL CITRATE PF 50 MCG/ML IJ SOSY
25.0000 ug | PREFILLED_SYRINGE | INTRAMUSCULAR | Status: DC | PRN
  Administered 2022-03-11 – 2022-03-13 (×2): 25 ug via INTRAVENOUS
  Filled 2022-03-11 (×2): qty 1

## 2022-03-11 MED ORDER — FENTANYL CITRATE (PF) 100 MCG/2ML IJ SOLN
INTRAMUSCULAR | Status: DC | PRN
  Administered 2022-03-11 (×2): 50 ug via INTRAVENOUS

## 2022-03-11 MED ORDER — LIDOCAINE HCL (PF) 2 % IJ SOLN
INTRAMUSCULAR | Status: AC
  Filled 2022-03-11: qty 5

## 2022-03-11 MED ORDER — ORAL CARE MOUTH RINSE: 15.0000 mL | OROMUCOSAL | Status: DC | PRN

## 2022-03-11 MED ORDER — PHENYLEPHRINE 80 MCG/ML (10ML) SYRINGE FOR IV PUSH (FOR BLOOD PRESSURE SUPPORT)
PREFILLED_SYRINGE | INTRAVENOUS | Status: AC
  Filled 2022-03-11: qty 20

## 2022-03-11 MED ORDER — ROCURONIUM BROMIDE 10 MG/ML (PF) SYRINGE
PREFILLED_SYRINGE | INTRAVENOUS | Status: AC
  Filled 2022-03-11: qty 10

## 2022-03-11 MED ORDER — DEXMEDETOMIDINE HCL IN NACL 400 MCG/100ML IV SOLN
0.4000 ug/kg/h | INTRAVENOUS | Status: DC
  Administered 2022-03-12: 0.6 ug/kg/h via INTRAVENOUS
  Administered 2022-03-12: 0.8 ug/kg/h via INTRAVENOUS
  Administered 2022-03-13 – 2022-03-14 (×5): 0.9 ug/kg/h via INTRAVENOUS
  Administered 2022-03-14 – 2022-03-15 (×4): 1 ug/kg/h via INTRAVENOUS
  Filled 2022-03-11 (×11): qty 100

## 2022-03-11 MED ORDER — CHLORHEXIDINE GLUCONATE CLOTH 2 % EX PADS
6.0000 | MEDICATED_PAD | Freq: Every day | CUTANEOUS | Status: DC
  Administered 2022-03-11 – 2022-03-15 (×6): 6 via TOPICAL

## 2022-03-11 MED ORDER — DEXAMETHASONE SODIUM PHOSPHATE 4 MG/ML IJ SOLN
INTRAMUSCULAR | Status: AC
  Filled 2022-03-11: qty 2

## 2022-03-11 MED ORDER — LIDOCAINE HCL (CARDIAC) PF 100 MG/5ML IV SOSY
PREFILLED_SYRINGE | INTRAVENOUS | Status: DC | PRN
  Administered 2022-03-11: 60 mg via INTRATRACHEAL

## 2022-03-11 MED ORDER — IPRATROPIUM BROMIDE 0.02 % IN SOLN
RESPIRATORY_TRACT | Status: AC
  Administered 2022-03-11: 0.5 mg
  Filled 2022-03-11: qty 2.5

## 2022-03-11 MED ORDER — MIDAZOLAM HCL 2 MG/2ML IJ SOLN
INTRAMUSCULAR | Status: AC
  Filled 2022-03-11: qty 2

## 2022-03-11 MED ORDER — MIDAZOLAM HCL 5 MG/5ML IJ SOLN
INTRAMUSCULAR | Status: DC | PRN
  Administered 2022-03-11 (×2): 2 mg via INTRAVENOUS

## 2022-03-11 MED ORDER — ETOMIDATE 2 MG/ML IV SOLN
INTRAVENOUS | Status: AC
  Filled 2022-03-11: qty 10

## 2022-03-11 MED ORDER — SODIUM CHLORIDE 0.9 % IV SOLN: INTRAVENOUS | Status: DC

## 2022-03-11 MED ORDER — ALBUMIN HUMAN 25 % IV SOLN
25.0000 g | Freq: Four times a day (QID) | INTRAVENOUS | Status: AC
  Administered 2022-03-11 – 2022-03-12 (×6): 25 g via INTRAVENOUS
  Filled 2022-03-11 (×6): qty 100

## 2022-03-11 MED ORDER — FENTANYL CITRATE (PF) 100 MCG/2ML IJ SOLN
INTRAMUSCULAR | Status: AC
  Filled 2022-03-11: qty 2

## 2022-03-11 MED ORDER — SODIUM CHLORIDE 0.9 % IV BOLUS
250.0000 mL | Freq: Once | INTRAVENOUS | Status: AC
  Administered 2022-03-11: 250 mL via INTRAVENOUS

## 2022-03-11 MED ORDER — METOCLOPRAMIDE HCL 5 MG/ML IJ SOLN
10.0000 mg | Freq: Once | INTRAMUSCULAR | Status: AC
  Administered 2022-03-11: 10 mg via INTRAVENOUS
  Filled 2022-03-11: qty 2

## 2022-03-11 MED ORDER — LIDOCAINE HCL (PF) 1 % IJ SOLN
INTRAMUSCULAR | Status: AC
  Administered 2022-03-11: 5 mL
  Filled 2022-03-11: qty 5

## 2022-03-11 MED ORDER — DEXAMETHASONE SODIUM PHOSPHATE 4 MG/ML IJ SOLN
INTRAMUSCULAR | Status: DC | PRN
  Administered 2022-03-11: 8 mg via INTRAVENOUS

## 2022-03-11 MED ORDER — NOREPINEPHRINE 4 MG/250ML-% IV SOLN
INTRAVENOUS | Status: AC
  Administered 2022-03-11: 4 mg
  Filled 2022-03-11: qty 250

## 2022-03-11 MED ORDER — SODIUM CHLORIDE 0.9 % IV SOLN: INTRAVENOUS | Status: DC | PRN

## 2022-03-11 MED ORDER — DEXMEDETOMIDINE HCL IN NACL 400 MCG/100ML IV SOLN
INTRAVENOUS | Status: AC
  Administered 2022-03-11: 0.4 ug/kg/h via INTRAVENOUS
  Filled 2022-03-11: qty 100

## 2022-03-11 MED ORDER — SODIUM CHLORIDE 0.9 % IV SOLN: 250.0000 mL | INTRAVENOUS | Status: DC

## 2022-03-11 MED ORDER — PANTOPRAZOLE SODIUM 40 MG IV SOLR: 40.0000 mg | Freq: Once | INTRAVENOUS | Status: DC

## 2022-03-11 MED ORDER — ETOMIDATE 2 MG/ML IV SOLN
INTRAVENOUS | Status: DC | PRN
  Administered 2022-03-11: 10 mg via INTRAVENOUS

## 2022-03-11 MED ORDER — ROCURONIUM BROMIDE 10 MG/ML (PF) SYRINGE
PREFILLED_SYRINGE | INTRAVENOUS | Status: DC | PRN
  Administered 2022-03-11: 100 mg via INTRAVENOUS

## 2022-03-11 MED ORDER — IPRATROPIUM-ALBUTEROL 0.5-2.5 (3) MG/3ML IN SOLN
3.0000 mL | Freq: Four times a day (QID) | RESPIRATORY_TRACT | Status: DC | PRN
  Administered 2022-03-12: 3 mL via RESPIRATORY_TRACT
  Filled 2022-03-11: qty 3

## 2022-03-11 MED ORDER — DOCUSATE SODIUM 50 MG/5ML PO LIQD
100.0000 mg | Freq: Two times a day (BID) | ORAL | Status: DC
  Filled 2022-03-11: qty 10

## 2022-03-11 MED ORDER — FENTANYL CITRATE PF 50 MCG/ML IJ SOSY
25.0000 ug | PREFILLED_SYRINGE | INTRAMUSCULAR | Status: DC | PRN
  Administered 2022-03-11: 100 ug via INTRAVENOUS
  Administered 2022-03-11: 50 ug via INTRAVENOUS
  Administered 2022-03-11 – 2022-03-12 (×2): 100 ug via INTRAVENOUS
  Administered 2022-03-12: 50 ug via INTRAVENOUS
  Administered 2022-03-12: 100 ug via INTRAVENOUS
  Administered 2022-03-12: 50 ug via INTRAVENOUS
  Administered 2022-03-15 (×2): 75 ug via INTRAVENOUS
  Administered 2022-03-15: 100 ug via INTRAVENOUS
  Filled 2022-03-11 (×2): qty 1
  Filled 2022-03-11 (×4): qty 2
  Filled 2022-03-11: qty 1

## 2022-03-11 MED ORDER — LACTATED RINGERS IV SOLN: INTRAVENOUS | Status: DC | PRN

## 2022-03-11 MED ORDER — ALBUTEROL SULFATE (2.5 MG/3ML) 0.083% IN NEBU
INHALATION_SOLUTION | RESPIRATORY_TRACT | Status: AC
  Administered 2022-03-11: 2.5 mg
  Filled 2022-03-11: qty 3

## 2022-03-11 MED ORDER — SODIUM CHLORIDE 0.9% IV SOLUTION: Freq: Once | INTRAVENOUS | Status: DC

## 2022-03-11 MED ORDER — ALBUMIN HUMAN 5 % IV SOLN
INTRAVENOUS | Status: AC
  Filled 2022-03-11: qty 250

## 2022-03-11 NOTE — Progress Notes (Signed)
Patient had large bloody bowel movements this AM before EGD to which Dr Tat was made aware. 2 units of PRBC given per orders and per EGD nurse another unit was given during procedure. Unit of platelets and two units of plasma given per orders with no signs or symptoms of adverse reactions. Patient on ventilator post EGD and was unresponsive for a few hours post EGD. Dr Tat in room and assessed/aware. No sedation meds given during this time. Patient slowly became more alert after 3pm and started to be a little restless/agitated on vent so Dr Tat made aware and PRN meds ordered and given. Patient became more alert after 6pm. PRN given as needed. Urine sample sent to lab and per verbal from Dr Tat, foley placed due to retention and bladder scan result. Patient has not had any bloody bowel movements or vomited any blood since after procedure thus far. Dr Tat made aware. Report given to night nurse and will continue to monitor.

## 2022-03-11 NOTE — Consult Note (Signed)
Maylon Peppers, M.D. Gastroenterology & Hepatology                                           Patient Name: Edward [name] [facility] Account #: _0 @   MRN: [id number] Admission Date: 04/05/2022 Date of Evaluation:  03/20/2022 Time of Evaluation: 8:54 AM   Referring Physician: Orson Eva, MD  Chief Complaint:  Hematemesis and melena  HPI:  This is a 71 y.o. male with history of decompensated liver cirrhosis (unclear if related to possible Wilson's disease) complicated by recurrent variceal bleeding, coronary artery disease, history of myocardial infarction, hypertension, hyperlipidemia, diabetes, who came to the hospital for evaluation after presenting acute hematemesis yesterday.    The patient was brought to the hospital after presenting 1 episode of hematemesis.  It is unclear if he had presented an episode of syncope as per EMS he presented this episode but based on note from admitting physician he did not present any syncope.   In the ED, his blood pressure was soft 108/67 with a heart rate of 99, he was afebrile.  CBC showed a hemoglobin of 10.1, white blood cell count of 6.0, platelets 61,000, CMP showed a creatinine of 3.52, sodium 132, potassium 5.5, total bilirubin 3.5, INR was 1.5.  Chest x-ray showed poor inspiration.  Overnight, the patient presented a couple more episodes of hematemesis and melena.  He was monitored in the ICU and he presented significant drop in his blood pressure down to 70s over 30s.  He was started on Levophed in the morning and he was ordered to get transfused 2 units of PRBC.  At that time he was started on octreotide and PPI twice daily IV.  Notably, I was not notified about this case until the early morning (called by Dr.Tat  at 7:44 AM).  Patient was admitted to [facility] on 02/20/2022 after presenting similar symptoms.  He was found to have bleeding grade 3 esophageal varices which were banded x6.  Notably, the patient was not considered a transplant  candidate by [facility] due to multiple comorbidities.  Of concern, the patient presented significant worsening of his hemoglobin down to 6.1, platelets 56,000, CMP with worsening creatinine of 3.99, BUN 64, potassium 5.3, total bilirubin 3.5, AST 42, ALT 32, INR 2.1.  Past Medical History: SEE CHRONIC ISSSUES: Past Medical History:  Diagnosis Date   Cataract    CKD (chronic kidney disease) stage 3, GFR 30-59 ml/min (HCC)    Coronary atherosclerosis of native coronary artery    BMS LAD 2006; residual 75% distal RCA; EF 50%   Essential hypertension    Glaucoma    Low back pain    MI (myocardial infarction) ([place])    Anterior 2006   Mixed hyperlipidemia    Renal insufficiency    Type 2 diabetes mellitus ([place])    Past Surgical History:  Past Surgical History:  Procedure Laterality Date   CATARACT EXTRACTION, BILATERAL     CORONARY ANGIOPLASTY WITH STENT PLACEMENT  2006   ESOPHAGEAL BANDING  02/01/2022   Procedure: ESOPHAGEAL BANDING;  Surgeon: Eloise Harman, DO;  Location: [facility];  Service: Endoscopy;;   ESOPHAGEAL BANDING  02/20/2022   Procedure: ESOPHAGEAL BANDING;  Surgeon: Ladene Artist, MD;  Location: [facility] ENDOSCOPY;  Service: Gastroenterology;;   ESOPHAGOGASTRODUODENOSCOPY (EGD) WITH PROPOFOL N/A 02/01/2022   Procedure: ESOPHAGOGASTRODUODENOSCOPY (EGD) WITH PROPOFOL;  Surgeon: Hurshel Keys  K, DO;  Location: [facility];  Service: Endoscopy;  Laterality: N/A;  12:30 am   ESOPHAGOGASTRODUODENOSCOPY (EGD) WITH PROPOFOL N/A 02/20/2022   Procedure: ESOPHAGOGASTRODUODENOSCOPY (EGD) WITH PROPOFOL;  Surgeon: Ladene Artist, MD;  Location: [facility] ENDOSCOPY;  Service: Gastroenterology;  Laterality: N/A;   IR PARACENTESIS  02/27/2022   Family History:  Family History  Problem Relation Age of Onset   Aneurysm Father        Brain aneurysm   Lung cancer Mother        Small cell carcinoma of lung,kidney and heart   Heart disease Mother    Cancer Brother        lungs   Diabetes  Sister    Diabetes Sister    Diabetes Brother    Diabetes Brother    Social History:  Social History   Tobacco Use   Smoking status: Former    Packs/day: 2.00    Years: 20.00    Total pack years: 40.00    Types: Cigarettes    Quit date: 05/08/1987    Years since quitting: 34.8    Passive exposure: Past   Smokeless tobacco: Never  Vaping Use   Vaping Use: Never used  Substance Use Topics   Alcohol use: No    Alcohol/week: 0.0 standard drinks of alcohol   Drug use: No    Home Medications:  Prior to Admission medications   Medication Sig Start Date End Date Taking? Authorizing Provider  albuterol (VENTOLIN HFA) 108 (90 Base) MCG/ACT inhaler Inhale 1-2 puffs into the lungs every 6 (six) hours as needed for wheezing or shortness of breath. 01/23/22  Yes Triplett, Tammy, PA-C  brimonidine (ALPHAGAN) 0.2 % ophthalmic solution Place 1 drop into both eyes 2 (two) times daily. 01/09/22  Yes [provider]  furosemide (LASIX) 40 MG tablet Take 1 tablet by mouth once daily 03/09/22  Yes Hassell Done, Mary-Margaret, FNP  lactulose (CHRONULAC) 10 GM/15ML solution Take 15 mLs (10 g total) by mouth 2 (two) times daily. Patient taking differently: Take 10 g by mouth 2 (two) times daily. Can take additional dose if needed for constipation 02/09/22  Yes Hawks, Christy A, FNP  linagliptin (TRADJENTA) 5 MG TABS tablet Take 1 tablet (5 mg total) by mouth daily. 02/09/22  Yes Hawks, Christy A, FNP  midodrine (PROAMATINE) 10 MG tablet Take 2 tablets (20 mg total) by mouth 3 (three) times daily with meals. 01/12/22  Yes Hassell Done, Mary-Margaret, FNP  nitroGLYCERIN (NITROSTAT) 0.4 MG SL tablet Place 1 tablet (0.4 mg total) under the tongue every 5 (five) minutes x 3 doses as needed for chest pain (if no relief after 2nd dose, proceed to ED or call 911). 02/19/22  Yes Satira Sark, MD  pantoprazole (PROTONIX) 40 MG tablet Take 1 tablet (40 mg total) by mouth 2 (two) times daily. 02/01/22 02/01/23 Yes Carver,  Elon Alas, DO  potassium chloride (KLOR-CON) 10 MEQ tablet Take 1 tablet by mouth once daily 03/09/22  Yes Martin, Mary-Margaret, FNP  sodium bicarbonate 650 MG tablet Take 1 tablet (650 mg total) by mouth 3 (three) times daily. 11/22/21  Yes Barton Dubois, MD  tamsulosin (FLOMAX) 0.4 MG CAPS capsule TAKE 1 CAPSULE BY MOUTH ONCE DAILY AFTER SUPPER Patient taking differently: Take 0.4 mg by mouth daily after supper. 03/09/22  Yes Martin, Mary-Margaret, FNP  traZODone (DESYREL) 50 MG tablet Take 1 tablet (50 mg total) by mouth at bedtime. 02/05/22  Yes [name], FNP    Inpatient Medications:  Current  Facility-Administered Medications:    0.9 %  sodium chloride infusion (Manually program via Guardrails IV Fluids), , Intravenous, Once, Tat, David, MD   0.9 %  sodium chloride infusion (Manually program via Guardrails IV Fluids), , Intravenous, Once, Tat, David, MD   0.9 %  sodium chloride infusion (Manually program via Guardrails IV Fluids), , Intravenous, Once, Tat, David, MD   0.9 %  sodium chloride infusion, 250 mL, Intravenous, Continuous, Tat, David, MD   albumin human 25 % solution 50 g, 50 g, Intravenous, BID, Truett Mainland, DO, Last Rate: 60 mL/hr at 04/03/2022 0315, 50 g at 04/02/2022 0315   brimonidine (ALPHAGAN) 0.2 % ophthalmic solution 1 drop, 1 drop, Both Eyes, BID, Stinson, Jacob J, DO, 1 drop at 03/23/2022 2234   cefTRIAXone (ROCEPHIN) 1 g in sodium chloride 0.9 % 100 mL IVPB, 1 g, Intravenous, Q24H, Stinson, Jacob J, DO, Last Rate: 200 mL/hr at 03/26/2022 2124, 1 g at 03/20/2022 2124   Chlorhexidine Gluconate Cloth 2 % PADS 6 each, 6 each, Topical, Daily, Tat, David, MD   insulin aspart (novoLOG) injection 0-6 Units, 0-6 Units, Subcutaneous, Q4H, Tat, David, MD   lactulose (CHRONULAC) 10 GM/15ML solution 10 g, 10 g, Oral, BID, Stinson, Jacob J, DO   midodrine (PROAMATINE) tablet 20 mg, 20 mg, Oral, TID WC, Truett Mainland, DO, 20 mg at 03/16/2022 0644   norepinephrine (LEVOPHED) 97m  in 2583m(0.016 mg/mL) premix infusion, 2-10 mcg/min, Intravenous, Titrated, Tat, David, MD   [COMPLETED] octreotide (SANDOSTATIN) 2 mcg/mL load via infusion 50 mcg, 50 mcg, Intravenous, Once, 50 mcg at 03/12/2022 2208 **AND** octreotide (SANDOSTATIN) 500 mcg in sodium chloride 0.9 % 250 mL (2 mcg/mL) infusion, 50 mcg/hr, Intravenous, Continuous, StTruett MainlandDO, Last Rate: 25 mL/hr at 03/31/2022 2219, 50 mcg/hr at 03/08/2022 2219   ondansetron (ZOFRAN) tablet 4 mg, 4 mg, Oral, Q6H PRN **OR** ondansetron (ZOFRAN) injection 4 mg, 4 mg, Intravenous, Q6H PRN, StTruett MainlandDO, 4 mg at 03/07/2022 0751   pantoprazole (PROTONIX) injection 40 mg, 40 mg, Intravenous, Q12H, Stinson, Jacob J, DO, 40 mg at 04/05/2022 2234   tamsulosin (FLOMAX) capsule 0.4 mg, 0.4 mg, Oral, QPC supper, Stinson, Jacob J, DO   traZODone (DESYREL) tablet 50 mg, 50 mg, Oral, QHS, Stinson, Jacob J, DO Allergies: Patient has no known allergies.  Complete Review of Systems: GENERAL: negative for malaise, night sweats HEENT: No changes in hearing or vision, no nose bleeds or other nasal problems. NECK: Negative for lumps, goiter, pain and significant neck swelling RESPIRATORY: Negative for cough, wheezing CARDIOVASCULAR: Negative for chest pain, leg swelling, palpitations, orthopnea GI: SEE HPI MUSCULOSKELETAL: Negative for joint pain or swelling, back pain, and muscle pain. SKIN: Negative for lesions, rash PSYCH: Negative for sleep disturbance, mood disorder and recent psychosocial stressors. HEMATOLOGY Negative for prolonged bleeding, bruising easily, and swollen nodes. ENDOCRINE: Negative for cold or heat intolerance, polyuria, polydipsia and goiter. NEURO: negative for tremor, gait imbalance, syncope and seizures. The remainder of the review of systems is noncontributory.  Physical Exam: BP (!) 114/38   Pulse 75   Temp 97.9 F (36.6 C) (Oral)   Resp 16   Ht _0  (1.778 m)   Wt 65.8 kg   SpO2 100%   BMI 20.81 kg/m   GENERAL: The patient is AO x3, in no acute distress. Looks very pale. HEENT: Head is normocephalic and atraumatic. EOMI are intact. Mouth is well hydrated and without lesions. NECK: Supple. No masses LUNGS: Clear to auscultation.  No presence of rhonchi/wheezing/rales. Adequate chest expansion HEART: RRR, normal s1 and s2. ABDOMEN:  nontender, no guarding, no peritoneal signs, moderately distended. BS +. No masses. RECTAL EXAM: deferred EXTREMITIES: Without any cyanosis, clubbing, rash, lesions or edema. NEUROLOGIC: AOx3, no focal motor deficit. SKIN: no jaundice, no rashes  Laboratory Data CBC:     Component Value Date/Time   WBC 6.2 03/19/2022 0756   RBC 1.87 (L) 03/24/2022 0756   HGB 6.1 (LL) 03/16/2022 0756   HGB 12.2 (L) 11/14/2021 0820   HCT 18.8 (L) 03/28/2022 0756   HCT 35.5 (L) 11/14/2021 0820   PLT 56 (L) 03/14/2022 0756   PLT 61 (LL) 11/14/2021 0820   MCV 100.5 (H) 03/26/2022 0756   MCV 96 11/14/2021 0820   MCH 32.6 03/09/2022 0756   MCHC 32.4 03/13/2022 0756   RDW 20.4 (H) 04/02/2022 0756   RDW 14.4 11/14/2021 0820   LYMPHSABS 1.1 02/21/2022 0440   LYMPHSABS 0.9 11/14/2021 0820   MONOABS 0.8 02/21/2022 0440   EOSABS 0.0 02/21/2022 0440   EOSABS 0.0 11/14/2021 0820   BASOSABS 0.0 02/21/2022 0440   BASOSABS 0.0 11/14/2021 0820   COAG:  Lab Results  Component Value Date   INR 2.1 (H) 03/31/2022   INR 1.5 (H) 03/12/2022   INR 1.8 (H) 03/01/2022    BMP:     Latest Ref Rng & Units 03/19/2022    2:27 PM 03/01/2022    1:15 AM 02/28/2022    3:17 AM  BMP  Glucose 70 - 99 mg/dL 239  220  128   BUN 8 - 23 mg/dL 46  40  39   Creatinine 0.61 - 1.24 mg/dL 3.52  2.26  2.04   Sodium 135 - 145 mmol/L 132  131  137   Potassium 3.5 - 5.1 mmol/L 5.5  4.1  3.9   Chloride 98 - 111 mmol/L 98  103  108   CO2 22 - 32 mmol/L _0 Calcium 8.9 - 10.3 mg/dL 8.6  8.5  8.8     HEPATIC:     Latest Ref Rng & Units 04/04/2022    2:27 PM 03/01/2022    1:15 AM 02/28/2022     3:17 AM  Hepatic Function  Total Protein 6.5 - 8.1 g/dL 5.9  4.7  4.9   Albumin 3.5 - 5.0 g/dL 3.1  2.8  2.9   AST 15 - 41 U/L 71  53  44   ALT 0 - 44 U/L <5  41  32   Alk Phosphatase 38 - 126 U/L 78  63  42   Total Bilirubin 0.3 - 1.2 mg/dL 3.5  1.7  2.8     CARDIAC:  Lab Results  Component Value Date   CKTOTAL 367 11/27/2021     Imaging: I personally reviewed and interpreted the available imaging.  Assessment & Plan: Edward [name] is a 71 y.o. male with history of decompensated liver cirrhosis (unclear if related to possible Wilson's disease) complicated by recurrent variceal bleeding, coronary artery disease, history of myocardial infarction, hypertension, hyperlipidemia, diabetes, who came to the hospital for evaluation after presenting acute hematemesis yesterday.  Patient presented significant drop in his hemoglobin with severe hypotension which given his recent endoscopic findings, are concerning for recurrent large variceal bleeding.  I had a very thorough discussion with the patient's family and the patient regarding the patient's guarded prognosis and severe disease.  We discussed that given the severity of his bleeding  and his elevated MELD score (32), he has a high mortality risk.  At this point he needs to be transfused aggressively with 2 units of PRBC and we will attempt to perform an EGD under general anesthesia to achieve hemostasis.  I explained to the family and the patient that given the severity of his condition, salvage therapy with TIPS could be offered but given his severely advanced liver disease he may not be a candidate for this.  For now, we will continue him on octreotide drip and ceftriaxone prophylaxis 1 g every day.  PPI has been ordered as twice daily dosing, will switch to drip for now.  We will give 1 dose of Reglan to increase gastric emptying.  Patient has 2 IV accesses, will undergo placement of central line to continue vasopressor support while  undergoing endoscopy.  We will check fibrinogen.  Agree with platelet transfusion x2 units and possible FFP.  Given severity of current disease, I will recommend palliative care consult.  - Repeat CBC q6h, transfuse 2 UPRBC , transfuse if Hb <7 -Agree with transfusion of platelets x2 unit, consider FFP depending on endoscopic findings - Do not transfuse if pts Hb >9 unless overtly bleeding - Pantoprazole ggt - Octreotide ggt 50 mcg/h - 2 large bore IV lines - Active T/S - NPO - Will perform EGD today - Avoid NSAIDs - May consider emergent TIPS as salvage therapy - Palliative care consult  Maylon Peppers, MD Gastroenterology and [facility]

## 2022-03-11 NOTE — Plan of Care (Signed)

## 2022-03-11 NOTE — Progress Notes (Signed)
Not sure why Patient VT was decreased to 550 as was original was 580. No blood gas has been drawn since on vent. Will check shortly.

## 2022-03-11 NOTE — Procedures (Signed)
Procedure Note  03/27/2022    Preoperative Diagnosis: Hemorrhagic shock   Postoperative Diagnosis: Same   Procedure(s) Performed: Central Line placement, right internal jugular    Surgeon: Lanell Matar. Constance Haw, MD   Assistants: None   Anesthesia: 1% lidocaine    Complications: None    Indications: Edward [name] is a 71 y.o. with hemorrhagic shock needing a central line for blood and resuscitation . I discussed the risk and benefits of placement of the central line with him and his family, including but not limited to bleeding, infection, and risk of pneumothorax. They have given consent for the procedure.    Procedure: The patient placed supine. The right chest and neck was prepped and draped in the usual sterile fashion.  Wearing full gown and gloves, I performed the procedure.  One percent lidocaine was used for local anesthesia. An ultrasound was utilized to assess the jugular vein.  The needle with syringe was advanced into the subclavian vein with dark venous return, and a wire was placed using the Seldinger technique without difficulty.  Ectopia was not noted.  The skin was knicked and a dilator was placed, and the three lumen catheter was placed over the wire with continued control of the wire.  There was good draw back of blood from all three lumens and each flushed easily with saline.  The catheter was secured in 4 points with 2-0 silk and a biopatch and dressing was placed.     The patient tolerated the procedure well, and the CXR was ordered to confirm position of the central line.   Edward Labrum, MD [facility] [address] [place], [place] [address] [phone] (office)

## 2022-03-11 NOTE — Progress Notes (Signed)
One unit of PRBC's returned to blood bank in red cooler, and given to lab tech.

## 2022-03-11 NOTE — Anesthesia Preprocedure Evaluation (Signed)
Anesthesia Evaluation  Patient identified by MRN, date of birth, ID band Patient awake    Reviewed: Allergy & Precautions, H&P , NPO status , Patient's Chart, lab work & pertinent test results  Airway Mallampati: II  TM Distance: >3 FB Neck ROM: Full    Dental  (+) Missing, Poor Dentition, Dental Advisory Given,    Pulmonary shortness of breath and with exertion, pneumonia, former smoker   Pulmonary exam normal breath sounds clear to auscultation       Cardiovascular hypertension, Pt. on medications + CAD and + Past MI   Rhythm:Regular Rate:Normal + Systolic murmurs    Neuro/Psych negative neurological ROS  negative psych ROS   GI/Hepatic ,GERD  Medicated and Controlled,,(+) Cirrhosis   Esophageal Varices and ascites      Endo/Other  negative endocrine ROSdiabetes, Well Controlled, Type 2, Oral Hypoglycemic Agents    Renal/GU Renal Insufficiency and CRFRenal disease (AKI)  negative genitourinary   Musculoskeletal negative musculoskeletal ROS (+)    Abdominal   Peds negative pediatric ROS (+)  Hematology negative hematology ROS (+)   Anesthesia Other Findings   Reproductive/Obstetrics negative OB ROS                             Anesthesia Physical Anesthesia Plan  ASA: 4 and emergent  Anesthesia Plan: General   Post-op Pain Management: Minimal or no pain anticipated   Induction: Intravenous, Rapid sequence and Cricoid pressure planned  PONV Risk Score and Plan: 4 or greater and Ondansetron  Airway Management Planned: Oral ETT and Video Laryngoscope Planned  Additional Equipment:   Intra-op Plan:   Post-operative Plan: Post-operative intubation/ventilation  Informed Consent: I have reviewed the patients History and Physical, chart, labs and discussed the procedure including the risks, benefits and alternatives for the proposed anesthesia with the patient or authorized  representative who has indicated his/her understanding and acceptance.     Dental advisory given  Plan Discussed with: CRNA and Surgeon  Anesthesia Plan Comments:        Anesthesia Quick Evaluation

## 2022-03-11 NOTE — Progress Notes (Signed)
Date and time results received: 03/23/2022 0820 (use smartphrase ".now" to insert current time)  Test: HGB Critical Value: 6.1  Name of Provider Notified: Dr Tat  Orders Received? Or Actions Taken?:  Patient is already receiving 2 units of PRBC, plasma and platelets

## 2022-03-11 NOTE — Transfer of Care (Signed)
Immediate Anesthesia Transfer of Care Note  Patient: Edward [name] [facility]  Procedure(s) Performed: ESOPHAGOGASTRODUODENOSCOPY (EGD) WITH PROPOFOL ESOPHAGEAL BANDING  Patient Location: ICU  Anesthesia Type:General  Level of Consciousness: sedated, unresponsive, and Patient remains intubated per anesthesia plan  Airway & Oxygen Therapy: Patient remains intubated per anesthesia plan and Patient placed on Ventilator (see vital sign flow sheet for setting)  Post-op Assessment: Report given to RN and Post -op Vital signs reviewed and stable  Post vital signs: Reviewed and stable  Last Vitals:  Vitals Value Taken Time  BP 99/48 03/07/2022 1200  Temp 98.4 03/28/2022 1208  Pulse 81 03/09/2022 1204  Resp 14 03/14/2022 1204  SpO2 99 % 03/22/2022 1204  Vitals shown include unvalidated device data.  Last Pain:  Vitals:   04/04/2022 1100  TempSrc:   PainSc: 0-No pain         Complications: No notable events documented.

## 2022-03-11 NOTE — Anesthesia Procedure Notes (Signed)
Procedure Name: Intubation Date/Time: 03/13/2022 11:08 AM  Performed by: Denese Killings, MDPre-anesthesia Checklist: Patient identified, Emergency Drugs available, Suction available and Patient being monitored Patient Re-evaluated:Patient Re-evaluated prior to induction Oxygen Delivery Method: Circle system utilized Preoxygenation: Pre-oxygenation with 100% oxygen Induction Type: IV induction, Rapid sequence and Cricoid Pressure applied Laryngoscope Size: Glidescope and 4 Grade View: Grade I Tube type: Oral Tube size: 7.5 mm Number of attempts: 1 Airway Equipment and Method: Stylet and Oral airway Placement Confirmation: ETT inserted through vocal cords under direct vision, positive ETCO2 and breath sounds checked- equal and bilateral Secured at: 23 cm Tube secured with: Tape Dental Injury: Teeth and Oropharynx as per pre-operative assessment

## 2022-03-11 NOTE — Brief Op Note (Signed)
03/14/2022 - 03/30/2022  11:42 AM  PATIENT:  Jamie Kato Wilmore  71 y.o. male  PRE-OPERATIVE DIAGNOSIS:  hematemesis  POST-OPERATIVE DIAGNOSIS:  esophageal varices with 3 bands;  PROCEDURE:  Procedure(s): ESOPHAGOGASTRODUODENOSCOPY (EGD) WITH PROPOFOL (N/A) ESOPHAGEAL BANDING  SURGEON:  Surgeon(s) and Role:    * Harvel Quale, MD - Primary  Patient underwent EGD under general anesthesia.  Tolerated the procedure adequately.  Four columns of grade III varices with stigmata of recent bleeding were found in the middle third of the esophagus and in the lower third of the esophagus.  Red wale signs were present given the presence of nipple sign in the most distal area of the esophagus, adjacent to an post-banding ulcer.  Stigmata of prior treatment were evident - there were 5 healing ulcers in the esophagus.  Three bands were successfully placed with complete eradication, resulting in deflation of varices. Notably, the varix that had the nipple sign bled when suction was perfoemed for banding, but the bleeding stopped once the band was deployed. There was no bleeding at the end of the procedure.   Type 2 gastroesophageal varices (GOV2, esophageal varices which extend along the fundus) with no bleeding were found in the gastric fundus.  There were no stigmata of recent bleeding.  They were medium in largest diameter.  Mild portal hypertensive gastropathy was found in the stomach. The examined duodenum was normal.   RECOMMENDATIONS - Return patient to ICU for ongoing care.  - NPO.  - Continue pantoprazole drip and  octreotide drip for 72 hours. - Ceftriaxone PPX for 5 days. - H/H every 6 hours. - Active T/S. - Repeat EGD in 3-4 weeks. - Palliative care consult, goals of care discussion - patient is not a transplant candidate.  Maylon Peppers, MD Gastroenterology and Hepatology [facility]

## 2022-03-11 NOTE — Progress Notes (Signed)
PROGRESS NOTE  Tino Ronan [facility] AVW:[id number] DOB: 07-20-1950 DOA: 03/20/2022 PCP: [name], FNP  Brief History:  71 year old male with a history of diabetes mellitus type 2, hypertension, CKD stage III, coronary artery disease, cryptogenic liver cirrhosis with esophageal varices, coronary disease, hyperlipidemia presenting with an episode of unresponsiveness and spitting up blood.  Notably, the patient was recently hospitalized at [facility] from 02/20/2022 to 03/01/2022 due to hemorrhagic shock from bleeding esophageal varices.  The patient became hypotensive and obtunded.  He was intubated at that time for airway protection.  The patient went underwent EGD on 02/20/2022 which revealed grade 3 esophageal varices with active bleeding.  The varices were banded x6.  He was transfused 8 units PRBC and 2 units FFP.  Palliative medicine was consulted during the admission.  And full scope of care was continued.  The patient was discharged home with hemoglobin 9.5 and serum creatinine 2.26. In the ED, the patient was tachycardic with soft blood pressures with SBP in the 90s and low 100s.  WBC 6.0, hemoglobin 10.1, platelets 61,000.  Sodium 132, potassium 5.5, bicarbonate 19, serum creatinine 3.52.  EKG shows a sinus rhythm without any ST-T wave changes.  UA showed 21-50 WBC.  On the evening of 03/26/2022, the patient developed hematemesis.  He became hypotensive. On the morning of 03/07/2022, 2 units emergent PRBC and FFP were ordered.  GI was consulted to assist with management.  Patient was placed on Levophed.   Assessment/Plan: Hemorrhagic shock/acute blood loss anemia -Secondary to GI bleed, likely variceal bleed versus bleeding from portal hypertensive gastropathy -2 units PRBC transfused emergently -2 units FFP ordered for transfusion -1 unit platelets ordered -GI consulted -Start Levophed -Serial H&H -Continue ceftriaxone for SBP prophylaxis -Continue octreotide -Start  pantoprazole drip -Remain n.p.o. for now -continue levophed -remain on midodrine  Acute on chronic renal failure--CKD stage IIIb -Secondary to hypotension and volume depletion -Holding furosemide and spironolactone -Continue IV albumin transfusion -04/04/2022 serum creatinine 3.52 -Baseline creatinine 2.2-2.4  Decompensated liver cirrhosis -Paracentesis when the patient is stable -03/23/2022 MELD 30 -11/14/2021 hemoglobin A1c 6.7 -NovoLog sliding scale -he has been declined at [facility] hepatology for above reasons-he will need to follow-up with [facility]   Episode of Unresponsivness -suspect due to vagal phenomena and hemodynamic changes -Echo when stable -currently A&O x 3  Coronary Artery Disease -no chest pain presently -personally reviewed EKG--sinus, no STT changes  Diabetes Mellitus type 2 11/14/21 A1C--6.7   Family Communication:   spouse updated  Consultants:  GI--discussed with Dr. Jenetta Downer  Code Status:  FULL--confirmed with patient  DVT Prophylaxis:  SCDs   Procedures: As Listed in Progress Note Above  Antibiotics: Ceftriaxone 11/4>>    The patient is critically ill with multiple organ systems failure and requires high complexity decision making for assessment and support, frequent evaluation and titration of therapies, application of advanced monitoring technologies and extensive interpretation of multiple databases.  Critical care time - 45 mins.      Subjective: Patient complains of abd fullness.  Denies cp, sob, abd pain.  Objective: Vitals:   03/27/2022 0430 03/12/2022 0600 03/11/2022 0700 03/29/2022 0734  BP: (!) 78/37 (!) 83/45 (!) 77/52 (!) 84/38  Pulse: 86 76 81 74  Resp: (!) 28 14 (!) 22 16  Temp:   97.7 F (36.5 C) 97.7 F (36.5 C)  TempSrc:    Oral  SpO2: 100% 100% 100%   Weight:  Height:        Intake/Output Summary (Last 24 hours) at 03/27/2022 0737 Last data filed at 04/02/2022 0600 Gross per 24 hour  Intake 401.21 ml  Output  250 ml  Net 151.21 ml   Weight change:  Exam:  General:  Pt is alert, follows commands appropriately, not in acute distress HEENT: No icterus, No thrush, No neck mass, [place]/AT Cardiovascular: RRR, S1/S2, no rubs, no gallops Respiratory: CTA bilaterally, no wheezing, no crackles, no rhonchi Abdomen: Soft/+BS, non tender, non distended, no guarding Extremities: No edema, No lymphangitis, No petechiae, No rashes, no synovitis   Data Reviewed: I have personally reviewed following labs and imaging studies Basic Metabolic Panel: Recent Labs  Lab 03/12/2022 1427  NA 132*  K 5.5*  CL 98  CO2 19*  GLUCOSE 239*  BUN 46*  CREATININE 3.52*  CALCIUM 8.6*   Liver Function Tests: Recent Labs  Lab 03/31/2022 1427  AST 71*  ALT <5  ALKPHOS 78  BILITOT 3.5*  PROT 5.9*  ALBUMIN 3.1*   No results for input(s): "LIPASE", "AMYLASE" in the last 168 hours. Recent Labs  Lab 03/23/2022 1427  AMMONIA 34   Coagulation Profile: Recent Labs  Lab 03/22/2022 1427  INR 1.5*   CBC: Recent Labs  Lab 03/07/2022 1427  WBC 6.0  HGB 10.1*  HCT 30.4*  MCV 97.7  PLT 61*   Cardiac Enzymes: No results for input(s): "CKTOTAL", "CKMB", "CKMBINDEX", "TROPONINI" in the last 168 hours. BNP: Invalid input(s): "POCBNP" CBG: Recent Labs  Lab 03/23/2022 1820 03/12/2022 2018  GLUCAP 242* 237*   HbA1C: No results for input(s): "HGBA1C" in the last 72 hours. Urine analysis:    Component Value Date/Time   COLORURINE AMBER (A) 03/14/2022 1552   APPEARANCEUR HAZY (A) 03/22/2022 1552   LABSPEC 1.014 03/20/2022 1552   PHURINE 5.0 03/12/2022 1552   GLUCOSEU 50 (A) 03/31/2022 1552   HGBUR NEGATIVE 03/09/2022 1552   BILIRUBINUR NEGATIVE 03/16/2022 1552   KETONESUR 5 (A) 03/23/2022 1552   PROTEINUR 30 (A) 03/27/2022 1552   NITRITE NEGATIVE 03/08/2022 1552   LEUKOCYTESUR MODERATE (A) 03/07/2022 1552   Sepsis Labs: '@LABRCNTIP'$ (procalcitonin:4,lacticidven:4) ) Recent Results (from the past 240 hour(s))   Culture, body fluid w Gram Stain-bottle     Status: None (Preliminary result)   Collection Time: 03/06/22  1:20 PM   Specimen: Peritoneal Washings  Result Value Ref Range Status   Specimen Description PERITONEAL  Final   Special Requests BAA 10CC  Final   Culture   Final    NO GROWTH 4 DAYS Performed at [facility], [address] [place], [place] [address]    Report Status PENDING  Incomplete  Gram stain     Status: None   Collection Time: 03/06/22  1:20 PM   Specimen: Peritoneal Washings  Result Value Ref Range Status   Specimen Description PERITONEAL  Final   Special Requests NONE  Final   Gram Stain   Final    NO ORGANISMS SEEN WBC PRESENT,BOTH PMN AND MONONUCLEAR CYTOSPIN SMEAR Performed at [facility], [address], [place], [place] [address]    Report Status 03/06/2022 FINAL  Final     Scheduled Meds:  sodium chloride   Intravenous Once   sodium chloride   Intravenous Once   sodium chloride   Intravenous Once   brimonidine  1 drop Both Eyes BID   Chlorhexidine Gluconate Cloth  6 each Topical Daily   insulin aspart  0-15 Units Subcutaneous TID WC  insulin aspart  0-5 Units Subcutaneous QHS   lactulose  10 g Oral BID   linagliptin  5 mg Oral Daily   midodrine  20 mg Oral TID WC   pantoprazole (PROTONIX) IV  40 mg Intravenous Q12H   sodium bicarbonate  650 mg Oral TID   tamsulosin  0.4 mg Oral QPC supper   traZODone  50 mg Oral QHS   Continuous Infusions:  albumin human 50 g (04/04/2022 0315)   cefTRIAXone (ROCEPHIN)  IV 1 g (03/25/2022 2124)   norepinephrine     octreotide (SANDOSTATIN) 500 mcg in sodium chloride 0.9 % 250 mL (2 mcg/mL) infusion 50 mcg/hr (03/24/2022 2219)    Procedures/Studies: DG Chest Portable 1 View  Result Date: 03/31/2022 CLINICAL DATA:  Shortness of breath. Syncope. EXAM: PORTABLE CHEST 1 VIEW COMPARISON:  02/21/2022 FINDINGS: Normal sized heart. Poor inspiration. Minimal linear atelectasis/scarring at both lung bases. Otherwise, clear  lungs with normal vascularity. Thoracic spine degenerative changes. IMPRESSION: Poor inspiration with minimal bibasilar linear atelectasis/scarring. Electronically Signed   By: Claudie Revering M.D.   On: 03/26/2022 14:01   US Paracentesis  Result Date: 03/06/2022 INDICATION: Cirrhosis, ascites EXAM: ULTRASOUND GUIDED DIAGNOSTIC AND THERAPEUTIC PARACENTESIS MEDICATIONS: None. COMPLICATIONS: None immediate. PROCEDURE: Informed written consent was obtained from the patient after a discussion of the risks, benefits and alternatives to treatment. A timeout was performed prior to the initiation of the procedure. Initial ultrasound scanning demonstrates a large amount of ascites within the right lower abdominal quadrant. The right lower abdomen was prepped and draped in the usual sterile fashion. 1% lidocaine was used for local anesthesia. Following this, a 19 gauge, 7-cm, Yueh catheter was introduced. An ultrasound image was saved for documentation purposes. The paracentesis was performed. The catheter was removed and a dressing was applied. The patient tolerated the procedure well without immediate post procedural complication. Patient received post-procedure intravenous albumin; see nursing notes for details. FINDINGS: A total of approximately 5 of yellow fluid was removed. Samples were sent to the laboratory as requested by the clinical team. IMPRESSION: Successful ultrasound-guided paracentesis yielding 5 liters of peritoneal fluid. Electronically Signed   By: Lavonia Dana M.D.   On: 03/06/2022 14:00   IR Paracentesis  Result Date: 02/27/2022 INDICATION: Patient with a history of cirrhosis and recurrent ascites. Interventional radiology asked to perform a diagnostic and therapeutic paracentesis. EXAM: ULTRASOUND GUIDED PARACENTESIS MEDICATIONS: 1% lidocaine 10 mL COMPLICATIONS: None immediate. PROCEDURE: Informed written consent was obtained from the patient after a discussion of the risks, benefits and  alternatives to treatment. A timeout was performed prior to the initiation of the procedure. Initial ultrasound scanning demonstrates a large amount of ascites within the right lower abdominal quadrant. The right lower abdomen was prepped and draped in the usual sterile fashion. 1% lidocaine was used for local anesthesia. Following this, a 19 gauge, 7-cm, Yueh catheter was introduced. An ultrasound image was saved for documentation purposes. The paracentesis was performed. The catheter was removed and a dressing was applied. The patient tolerated the procedure well without immediate post procedural complication. Patient received post-procedure intravenous albumin; see nursing notes for details. FINDINGS: A total of approximately 5.4 L of clear yellow fluid was removed. Samples were sent to the laboratory as requested by the clinical team. IMPRESSION: Successful ultrasound-guided paracentesis yielding 5.4 liters of peritoneal fluid. Read by: Soyla Dryer, NP PLAN: The patient has previously been evaluated by the [facility], and deemed not a candidate for intervention at this time  due to persistently elevated Na-MELD Electronically Signed   By: Ruthann Cancer M.D.   On: 02/27/2022 14:36   DG Swallowing Func-Speech Pathology  Result Date: 02/27/2022 Table formatting from the original result was not included. Objective Swallowing Evaluation: Type of Study: MBS-Modified Barium Swallow Study  Patient Details Name: Edward [name] MRN: [id number] Date of Birth: 04-19-1951 Today's Date: 02/27/2022 Time: SLP Start Time (ACUTE ONLY): 1237 -SLP Stop Time (ACUTE ONLY): 8127 SLP Time Calculation (min) (ACUTE ONLY): 18 min Past Medical History: Past Medical History: Diagnosis Date  Cataract   CKD (chronic kidney disease) stage 3, GFR 30-59 ml/min (HCC)   Coronary atherosclerosis of native coronary artery   BMS LAD 2006; residual 75% distal RCA; EF 50%  Essential hypertension    Glaucoma   Low back pain   MI (myocardial infarction) ([place])   Anterior 2006  Mixed hyperlipidemia   Renal insufficiency   Type 2 diabetes mellitus ([place])  Past Surgical History: Past Surgical History: Procedure Laterality Date  CATARACT EXTRACTION, BILATERAL    CORONARY ANGIOPLASTY WITH STENT PLACEMENT  2006  ESOPHAGEAL BANDING  02/01/2022  Procedure: ESOPHAGEAL BANDING;  Surgeon: Eloise Harman, DO;  [facility];  Service: Endoscopy;;  ESOPHAGEAL BANDING  02/20/2022  Procedure: ESOPHAGEAL BANDING;  Surgeon: Ladene Artist, MD;  Location: [facility] ENDOSCOPY;  Service: Gastroenterology;;  ESOPHAGOGASTRODUODENOSCOPY (EGD) WITH PROPOFOL N/A 02/01/2022  Procedure: ESOPHAGOGASTRODUODENOSCOPY (EGD) WITH PROPOFOL;  Surgeon: Eloise Harman, DO;  Location: [facility];  Service: Endoscopy;  Laterality: N/A;  12:30 am  ESOPHAGOGASTRODUODENOSCOPY (EGD) WITH PROPOFOL N/A 02/20/2022  Procedure: ESOPHAGOGASTRODUODENOSCOPY (EGD) WITH PROPOFOL;  Surgeon: Ladene Artist, MD;  Location: [facility] ENDOSCOPY;  Service: Gastroenterology;  Laterality: N/A; HPI: RASHARD RYLE is a 71 y.o. male who presented to [place] 10/16 with cramping of the hands and feet.  He had had a paracentesis earlier that day with 4 L removed (see below).  While in ED, he began to spit up bright red blood.  This progressed to frank hematemesis, large-volume and persistent.  He was started on Protonix, octreotide, ceftriaxone.  He then became hypotensive; therefore, massive transfusion protocol was initiated.  He received 8 units PRBC, 2 units FFP, 1 unit platelets.  He was intubated for airway protection was also started on Levophed. Pt with EGD 10/18 with banding x6. CXR 10/18 without focal infiltrate. 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  He  was apparently evaluated at [facility] for transplant but was deemed to not be a candidate.  No data recorded  Recommendations for follow up therapy are one component of a multi-disciplinary discharge planning process, led by the attending physician.  Recommendations may be updated based on patient status, additional functional criteria and insurance authorization. Assessment / Plan / Recommendation   02/27/2022   1:11 PM Clinical Impressions Clinical Impression Pt presents with a mild oropharyngeal dysphagia c/b delayed swallow initiation, reduced base of tongue retraction, incomplete laryngeal closure, and diminished sensation.  These deficits resulted in penetration of thin liquid during the swallow.  Penetration was silent, but did fully clear with subsequent swallows.  There was moderate vallecula residue with all consistencies which was also reduced with subequent swallows.  With pill simulation, there was brief stasis of tablet in vallecula.  There was esophagea stasis with table which was not fully cleared by liquid wash, but  did clear with puree bolus.  Recommend advancing pt to regular texture solids as medically appropriate.  Alternate liquids and solids.  SLP Visit Diagnosis Dysphagia, oropharyngeal phase (R13.12) Impact on safety and function Mild aspiration risk     02/27/2022   1:11 PM Treatment Recommendations Treatment Recommendations No treatment recommended at this time     02/27/2022   1:15 PM Prognosis Prognosis for Safe Diet Advancement --   02/27/2022   1:11 PM Diet Recommendations SLP Diet Recommendations Thin liquid;Regular solids Liquid Administration via Straw Medication Administration Whole meds with liquid Compensations Slow rate;Small sips/bites;Follow solids with liquid     02/27/2022   1:11 PM Other Recommendations Oral Care Recommendations Oral care BID Follow Up Recommendations No SLP follow up Assistance recommended at discharge -- Functional Status Assessment Patient has not had a  recent decline in their functional status   02/23/2022  12:29 PM Frequency and Duration  Speech Therapy Frequency (ACUTE ONLY) min 2x/week Treatment Duration 2 weeks     02/27/2022   1:06 PM Oral Phase Oral - Thin Straw Premature spillage Oral - Puree WFL Oral - Regular [facility]    02/27/2022   1:09 PM Pharyngeal Phase Pharyngeal- Thin Straw Reduced tongue base retraction;Penetration/Aspiration during swallow Pharyngeal- Puree Pharyngeal residue - valleculae;Reduced tongue base retraction;Delayed swallow initiation-vallecula Pharyngeal- Regular Reduced tongue base retraction;Pharyngeal residue - valleculae Pharyngeal- Pill Reduced tongue base retraction;Pharyngeal residue - valleculae Pharyngeal Material does not enter airway    02/27/2022   1:08 PM Cervical Esophageal Phase  Cervical Esophageal Phase Impaired Celedonio Savage, MA, CCC-SLP Acute Rehabilitation Services Office: [phone] 02/27/2022, 1:20 PM                     DG Chest Port 1 View  Result Date: 02/21/2022 CLINICAL DATA:  Ventilator dependent respiratory failure. EXAM: PORTABLE CHEST 1 VIEW COMPARISON:  Portable chest yesterday at 10:23 p.m. FINDINGS: 4:59 a.m. ETT tip is 3.1 cm from the carina, left IJ central line tip in the distal SVC. There are multiple overlying monitor wires. There are low lung volumes. Mild elevation right hemidiaphragm limiting visualization of the right base. There is perihilar and right basilar linear atelectasis without focal consolidation the visualized lungs. The cardiomediastinal silhouette and vasculature are normal. There is thoracic spondylosis. IMPRESSION: Limited exam with low lung volumes and elevated right hemidiaphragm. Assessment of the bases is limited. The visualized lungs without focal infiltrates. Support apparatus as above. Electronically Signed   By: Telford Nab M.D.   On: 02/21/2022 06:18   DG CHEST PORT 1 VIEW  Result Date: 02/20/2022 CLINICAL DATA:  Central line placement EXAM: PORTABLE CHEST 1  VIEW COMPARISON:  02/20/2022 FINDINGS: Left central line is been placed with the tip in the SVC. No pneumothorax. Endotracheal tube is 5 cm above the carina. Heart normal size. No confluent opacities or effusions. IMPRESSION: Support devices in expected position as above. No acute cardiopulmonary disease. Electronically Signed   By: Rolm Baptise M.D.   On: 02/20/2022 22:30   DG Chest Port 1 View  Result Date: 02/20/2022 CLINICAL DATA:  Intubation EXAM: PORTABLE CHEST 1 VIEW COMPARISON:  02/13/2022 FINDINGS: Endotracheal tube with tip just below the clavicular heads. The enteric tube at least reaches the stomach. Artifact from EKG leads. Low volume chest with streaky density at the bases suggesting atelectasis. No effusion or pneumothorax. Skin fold at the left apex. IMPRESSION: 1. Unremarkable hardware positioning. 2. Low volume chest with mild atelectasis. Electronically Signed   By: Roderic Palau  Watts M.D.   On: 02/20/2022 04:13   US Paracentesis  Result Date: 02/19/2022 INDICATION: Ascites secondary to cirrhosis EXAM: ULTRASOUND GUIDED  PARACENTESIS MEDICATIONS: None. COMPLICATIONS: None immediate. PROCEDURE: Informed written consent was obtained from the patient after a discussion of the risks, benefits and alternatives to treatment. A timeout was performed prior to the initiation of the procedure. Initial ultrasound scanning demonstrates a large amount of ascites within the right lower abdominal quadrant. The right lower abdomen was prepped and draped in the usual sterile fashion. 1% lidocaine was used for local anesthesia. Following this, a 19 gauge, 7-cm, Yueh catheter was introduced. An ultrasound image was saved for documentation purposes. The paracentesis was performed. The catheter was removed and a dressing was applied. The patient tolerated the procedure well without immediate post procedural complication. Patient received post-procedure intravenous albumin; see nursing notes for details.  FINDINGS: A total of approximately 4.0 L of serous fluid was removed. Samples were sent to the laboratory as requested by the clinical team. IMPRESSION: Successful ultrasound-guided paracentesis yielding 4.0 L of peritoneal fluid. Electronically Signed   By: Abigail Miyamoto M.D.   On: 02/19/2022 12:13   US Paracentesis  Result Date: 02/14/2022 INDICATION: Cirrhosis with recurrent ascites EXAM: ULTRASOUND GUIDED RLQ PARACENTESIS MEDICATIONS: None. COMPLICATIONS: None immediate. PROCEDURE: Informed written consent was obtained from the patient after a discussion of the risks, benefits and alternatives to treatment. A timeout was performed prior to the initiation of the procedure. Initial ultrasound scanning demonstrates a large amount of ascites within the right lower abdominal quadrant. The right lower abdomen was prepped and draped in the usual sterile fashion. 1% lidocaine was used for local anesthesia. Following this, a 19 gauge, 7-cm, Yueh catheter was introduced. An ultrasound image was saved for documentation purposes. The paracentesis was performed. The catheter was removed and a dressing was applied. The patient tolerated the procedure well without immediate post procedural complication. Patient received post-procedure intravenous albumin; see nursing notes for details. FINDINGS: A total of approximately 3.4L of peritoneal fluid was removed. Samples were sent to the laboratory as requested by the clinical team. IMPRESSION: Successful ultrasound-guided paracentesis yielding 3.4 liters of peritoneal fluid. PLAN: The patient has required >/=2 paracenteses in a 30 day period and a formal evaluation by the [facility] has been arranged. Electronically Signed   By: Lavonia Dana M.D.   On: 02/14/2022 10:56   DG Abdomen Acute W/Chest  Result Date: 02/13/2022 CLINICAL DATA:  Short of breath, abdominal pain. History of cirrhosis EXAM: DG ABDOMEN ACUTE WITH 1 VIEW  CHEST COMPARISON:  Chest and abdomen 02/04/2022 FINDINGS: Heart size and vascularity normal. Mild bibasilar atelectasis. No pleural effusion Normal bowel gas pattern. No bowel obstruction or free air. Negative for urinary tract calculi. IMPRESSION: 1. Mild bibasilar atelectasis. 2. Normal bowel gas pattern. Electronically Signed   By: Franchot Gallo M.D.   On: 02/13/2022 16:21    Orson Eva, DO  [facility]  If 7PM-7AM, please contact night-coverage [email or web address] Password [email or web address] 03/31/2022, 7:37 AM   LOS: 1 day

## 2022-03-11 NOTE — Anesthesia Postprocedure Evaluation (Signed)
Anesthesia Post Note  Patient: Edward [name] [facility]  Procedure(s) Performed: ESOPHAGOGASTRODUODENOSCOPY (EGD) WITH PROPOFOL ESOPHAGEAL BANDING  Patient location during evaluation: ICU Anesthesia Type: General Level of consciousness: patient remains intubated per anesthesia plan Pain management: pain level controlled Vital Signs Assessment: post-procedure vital signs reviewed and stable Respiratory status: patient remains intubated per anesthesia plan Cardiovascular status: blood pressure returned to baseline and stable Postop Assessment: no apparent nausea or vomiting Anesthetic complications: no   No notable events documented.   Last Vitals:  Vitals:   03/09/2022 1200 03/12/2022 1208  BP:  (!) 101/49  Pulse: 89 80  Resp: 14 14  Temp:  36.9 C  SpO2: 98% 99%    Last Pain:  Vitals:   03/30/2022 1208  TempSrc: Axillary  PainSc:                  Demorris Choyce C Legrand Lasser

## 2022-03-11 NOTE — Progress Notes (Signed)
Patient had 1 liquid stool dark black in color. Dr. Josephine Cables notified.

## 2022-03-11 NOTE — Op Note (Signed)
[facility] Patient Name: Edward [name] Procedure Date: 03/14/2022 9:59 AM MRN: [id number] Date of Birth: Sep 30, 1950 Attending MD: Maylon Peppers , , [id number] CSN: [id number] Age: 71 Admit Type: Inpatient Procedure:                Upper GI endoscopy Indications:              Hematemesis Providers:                Maylon Peppers, Crystal Page, [place] Risa Grill, Technician Referring MD:              Medicines:                General Anesthesia Complications:            No immediate complications. Estimated Blood Loss:     Estimated blood loss: none. Procedure:                Pre-Anesthesia Assessment:                           - Prior to the procedure, a History and Physical                            was performed, and patient medications, allergies                            and sensitivities were reviewed. The patient's                            tolerance of previous anesthesia was reviewed.                           - The risks and benefits of the procedure and the                            sedation options and risks were discussed with the                            patient. All questions were answered and informed                            consent was obtained.                           - ASA Grade Assessment: IV - A patient with severe                            systemic disease that is a constant threat to life.                           After obtaining informed consent, the endoscope was                            passed under direct vision.  Throughout the                            procedure, the patient's blood pressure, pulse, and                            oxygen saturations were monitored continuously. The                            GIF-H190 ([id number]) scope was introduced through the                            mouth, and advanced to the second part of duodenum.                            The upper GI endoscopy was accomplished  without                            difficulty. The patient tolerated the procedure                            well. Scope In: 11:14:59 AM Scope Out: 11:30:14 AM Total Procedure Duration: 0 hours 15 minutes 15 seconds  Findings:      Four columns of grade III varices with stigmata of recent bleeding were       found in the middle third of the esophagus and in the lower third of the       esophagus. Red wale signs were present given the presence of nipple sign       in the most distal area of the esophagus, adjacent to an post-banding       ulcer. Stigmata of prior treatment were evident - there were 5 healing       ulcers in the esophagus. Three bands were successfully placed with       complete eradication, resulting in deflation of varices. Notably, the       varix that had the nipple sign bled when suction was perfoemed for       banding, but the bleeding stopped once the band was deployed. There was       no bleeding at the end of the procedure.      Type 2 gastroesophageal varices (GOV2, esophageal varices which extend       along the fundus) with no bleeding were found in the gastric fundus.       There were no stigmata of recent bleeding. They were medium in largest       diameter.      Mild portal hypertensive gastropathy was found in the stomach.      The examined duodenum was normal. Impression:               - Grade III esophageal varices with stigmata of                            recent bleeding. Completely eradicated. Banded.                           - Type 2 gastroesophageal varices (GOV2, esophageal  varices which extend along the fundus), without                            bleeding.                           - Portal hypertensive gastropathy.                           - Normal examined duodenum.                           - No specimens collected. Moderate Sedation:      Per Anesthesia Care Recommendation:           - Return patient to ICU  for ongoing care.                           - NPO.                           - Continue pantoprazole drip and octreotide drip                            for 72 hours.                           - Ceftriaxone PPX for 5 days.                           - H/H every 6 hours.                           - Active T/S.                           - Repeat EGD in 3-4 weeks.                           - Palliative care consult, goals of care discussion                            - patient is not a transplant candidate. Procedure Code(s):        --- Professional ---                           [phone], Esophagogastroduodenoscopy, flexible,                            transoral; with band ligation of esophageal/gastric                            varices Diagnosis Code(s):        --- Professional ---                           I85.01, Esophageal varices with bleeding  I86.4, Gastric varices                           K76.6, Portal hypertension                           K31.89, Other diseases of stomach and duodenum                           K92.0, Hematemesis CPT copyright 2022 American Medical Association. All rights reserved. The codes documented in this report are preliminary and upon coder review may  be revised to meet current compliance requirements. Maylon Peppers, MD Maylon Peppers,  03/14/2022 11:44:46 AM This report has been signed electronically. Number of Addenda: 0

## 2022-03-11 NOTE — Progress Notes (Signed)
[facility]  CXR without ptx and CVL in the RA. No ectopy or issues. Is actively getting blood. Will leave in place and not withdraw as this should not have to stay in long.    Curlene Labrum, MD [facility] [address] [place], [place] [address] [phone] (office)

## 2022-03-11 NOTE — Hospital Course (Addendum)
71 year old male with a history of diabetes mellitus type 2, hypertension, CKD stage III, coronary artery disease, cryptogenic liver cirrhosis with esophageal varices, coronary disease, hyperlipidemia presenting with an episode of unresponsiveness and spitting up blood.  Notably, the patient was recently hospitalized at [facility] from 02/20/2022 to 03/01/2022 due to hemorrhagic shock from bleeding esophageal varices.  The patient became hypotensive and obtunded.  He was intubated at that time for airway protection.  The patient went underwent EGD on 02/20/2022 which revealed grade 3 esophageal varices with active bleeding.  The varices were banded x6.  He was transfused 8 units PRBC and 2 units FFP.  Palliative medicine was consulted during the admission.  And full scope of care was continued.  The patient was discharged home with hemoglobin 9.5 and serum creatinine 2.26. In the ED, the patient was tachycardic with soft blood pressures with SBP in the 90s and low 100s.  WBC 6.0, hemoglobin 10.1, platelets 61,000.  Sodium 132, potassium 5.5, bicarbonate 19, serum creatinine 3.52.  EKG shows a sinus rhythm without any ST-T wave changes.  UA showed 21-50 WBC.  he went to to the hepatology service at [facility] 12/02/2021--?  Hepatorenal syndrome etc. there was question of Wilson's disease given abnormal copper testing--- he had negative ANA mitochondrial antibodies anti-smooth muscle IgG IgA IgM and negative viral hepatitis panels but has never had a liver biopsy Because of improved MELD score and lack of transportation they declined putting him on the transplant service and recommended he follow closely with hepatology at [facility] hepatology and family was given information about the same  On the evening of 03/29/2022, the patient developed hematemesis.  He became hypotensive. On the morning of 03/29/2022, 2 units emergent PRBC and FFP were ordered.  GI was consulted to assist with management.  Patient was placed on  Levophed. He underwent EGD 03/08/2022 which showed Four columns of grade III varices with stigmata of recent bleeding were found in the middle third of the esophagus and in the lower third of the esophagus.  Three bands were successfully placed with complete eradication, resulting in deflation of varices.He was left intubated after the procedure.  However, patient self-extubated on 03/12/22.  He was reintubated about 3-4 hours later.  He was started on zosyn for aspiration pneumonia.  GI and palliative continue to follow patient.  [place] discussion continue.

## 2022-03-11 NOTE — Progress Notes (Addendum)
[facility]  Central line pulled back 4cm and re-secured with sterile gloves and drapes, prep etc. Dressing replaced.  CXR ordered and line in good position on my review.   Curlene Labrum, MD [facility] [address] [place], [place] [address] [phone] (office)

## 2022-03-12 ENCOUNTER — Encounter (HOSPITAL_COMMUNITY): Admission: RE | Payer: Self-pay | Source: Home / Self Care

## 2022-03-12 ENCOUNTER — Inpatient Hospital Stay (HOSPITAL_COMMUNITY): Payer: 59

## 2022-03-12 ENCOUNTER — Other Ambulatory Visit (HOSPITAL_COMMUNITY): Payer: 59

## 2022-03-12 ENCOUNTER — Ambulatory Visit (HOSPITAL_COMMUNITY): Admission: RE | Admit: 2022-03-12 | Payer: 59 | Source: Home / Self Care

## 2022-03-12 ENCOUNTER — Encounter (HOSPITAL_COMMUNITY): Payer: Self-pay | Admitting: Internal Medicine

## 2022-03-12 DIAGNOSIS — Z515 Encounter for palliative care: Secondary | ICD-10-CM | POA: Diagnosis not present

## 2022-03-12 DIAGNOSIS — Z7189 Other specified counseling: Secondary | ICD-10-CM

## 2022-03-12 DIAGNOSIS — T17908A Unspecified foreign body in respiratory tract, part unspecified causing other injury, initial encounter: Secondary | ICD-10-CM

## 2022-03-12 DIAGNOSIS — K922 Gastrointestinal hemorrhage, unspecified: Secondary | ICD-10-CM

## 2022-03-12 DIAGNOSIS — K7290 Hepatic failure, unspecified without coma: Secondary | ICD-10-CM | POA: Diagnosis not present

## 2022-03-12 DIAGNOSIS — K7460 Unspecified cirrhosis of liver: Secondary | ICD-10-CM | POA: Diagnosis not present

## 2022-03-12 DIAGNOSIS — N179 Acute kidney failure, unspecified: Secondary | ICD-10-CM | POA: Diagnosis not present

## 2022-03-12 DIAGNOSIS — J9600 Acute respiratory failure, unspecified whether with hypoxia or hypercapnia: Secondary | ICD-10-CM

## 2022-03-12 LAB — BLOOD GAS, ARTERIAL
Acid-Base Excess: 0.3 mmol/L (ref 0.0–2.0)
Acid-base deficit: 12.1 mmol/L — ABNORMAL HIGH (ref 0.0–2.0)
Acid-base deficit: 10.4 mmol/L — ABNORMAL HIGH (ref 0.0–2.0)
Bicarbonate: 24.6 mmol/L (ref 20.0–28.0)
Bicarbonate: 17 mmol/L — ABNORMAL LOW (ref 20.0–28.0)
Bicarbonate: 15.6 mmol/L — ABNORMAL LOW (ref 20.0–28.0)
Drawn by: 4442
Drawn by: 27016
Drawn by: 27407
FIO2: 55 %
O2 Saturation: 92.8 %
O2 Saturation: 83 %
O2 Saturation: 91.5 %
Patient temperature: 37
Patient temperature: 37
Patient temperature: 35.8
pCO2 arterial: 38 mmHg (ref 32–48)
pCO2 arterial: 51 mmHg — ABNORMAL HIGH (ref 32–48)
pCO2 arterial: 32 mmHg (ref 32–48)
pH, Arterial: 7.42 (ref 7.35–7.45)
pH, Arterial: 7.13 — CL (ref 7.35–7.45)
pH, Arterial: 7.29 — ABNORMAL LOW (ref 7.35–7.45)
pO2, Arterial: 60 mmHg — ABNORMAL LOW (ref 83–108)
pO2, Arterial: 55 mmHg — ABNORMAL LOW (ref 83–108)
pO2, Arterial: 55 mmHg — ABNORMAL LOW (ref 83–108)

## 2022-03-12 LAB — COMPREHENSIVE METABOLIC PANEL
ALT: 26 U/L (ref 0–44)
AST: 32 U/L (ref 15–41)
Albumin: 4.1 g/dL (ref 3.5–5.0)
Alkaline Phosphatase: 45 U/L (ref 38–126)
Anion gap: 11 (ref 5–15)
BUN: 69 mg/dL — ABNORMAL HIGH (ref 8–23)
CO2: 22 mmol/L (ref 22–32)
Calcium: 8.6 mg/dL — ABNORMAL LOW (ref 8.9–10.3)
Chloride: 101 mmol/L (ref 98–111)
Creatinine, Ser: 3.55 mg/dL — ABNORMAL HIGH (ref 0.61–1.24)
GFR, Estimated: 18 mL/min — ABNORMAL LOW (ref >60)
Glucose, Bld: 258 mg/dL — ABNORMAL HIGH (ref 70–99)
Potassium: 4.9 mmol/L (ref 3.5–5.1)
Sodium: 134 mmol/L — ABNORMAL LOW (ref 135–145)
Total Bilirubin: 9.1 mg/dL — ABNORMAL HIGH (ref 0.3–1.2)
Total Protein: 5.8 g/dL — ABNORMAL LOW (ref 6.5–8.1)

## 2022-03-12 LAB — PREPARE PLATELET PHERESIS: Unit division: 0

## 2022-03-12 LAB — BPAM FFP
Blood Product Expiration Date: 202311102359
Blood Product Expiration Date: 202311102359
ISSUE DATE / TIME: 202311051235
ISSUE DATE / TIME: 202311051413
Unit Type and Rh: 8400
Unit Type and Rh: 8400

## 2022-03-12 LAB — PROCALCITONIN: Procalcitonin: 0.64 ng/mL

## 2022-03-12 LAB — PREPARE RBC (CROSSMATCH)

## 2022-03-12 LAB — PREPARE FRESH FROZEN PLASMA
Unit division: 0
Unit division: 0

## 2022-03-12 LAB — GLUCOSE, CAPILLARY
Glucose-Capillary: 201 mg/dL — ABNORMAL HIGH (ref 70–99)
Glucose-Capillary: 229 mg/dL — ABNORMAL HIGH (ref 70–99)
Glucose-Capillary: 230 mg/dL — ABNORMAL HIGH (ref 70–99)
Glucose-Capillary: 233 mg/dL — ABNORMAL HIGH (ref 70–99)
Glucose-Capillary: 250 mg/dL — ABNORMAL HIGH (ref 70–99)
Glucose-Capillary: 252 mg/dL — ABNORMAL HIGH (ref 70–99)

## 2022-03-12 LAB — BPAM PLATELET PHERESIS
Blood Product Expiration Date: 202311062359
ISSUE DATE / TIME: 202311051006
Unit Type and Rh: 6200

## 2022-03-12 LAB — HEMOGLOBIN AND HEMATOCRIT, BLOOD
HCT: 25.4 % — ABNORMAL LOW (ref 39.0–52.0)
HCT: 33.3 % — ABNORMAL LOW (ref 39.0–52.0)
Hemoglobin: 8.6 g/dL — ABNORMAL LOW (ref 13.0–17.0)
Hemoglobin: 11 g/dL — ABNORMAL LOW (ref 13.0–17.0)

## 2022-03-12 LAB — PROTIME-INR
INR: 1.6 — ABNORMAL HIGH (ref 0.8–1.2)
Prothrombin Time: 19.2 seconds — ABNORMAL HIGH (ref 11.4–15.2)

## 2022-03-12 LAB — AMMONIA: Ammonia: 41 umol/L — ABNORMAL HIGH (ref 9–35)

## 2022-03-12 LAB — BRAIN NATRIURETIC PEPTIDE: B Natriuretic Peptide: 1184 pg/mL — ABNORMAL HIGH (ref 0.0–100.0)

## 2022-03-12 SURGERY — ESOPHAGOGASTRODUODENOSCOPY (EGD) WITH PROPOFOL
Anesthesia: Monitor Anesthesia Care

## 2022-03-12 MED ORDER — ORAL CARE MOUTH RINSE
15.0000 mL | OROMUCOSAL | Status: DC
  Administered 2022-03-12 – 2022-03-13 (×10): 15 mL via OROMUCOSAL

## 2022-03-12 MED ORDER — ETOMIDATE 2 MG/ML IV SOLN
INTRAVENOUS | Status: AC
  Filled 2022-03-12: qty 20

## 2022-03-12 MED ORDER — MIDAZOLAM HCL 2 MG/2ML IJ SOLN
INTRAMUSCULAR | Status: AC
  Filled 2022-03-12: qty 2

## 2022-03-12 MED ORDER — ROCURONIUM BROMIDE 10 MG/ML (PF) SYRINGE
PREFILLED_SYRINGE | INTRAVENOUS | Status: AC
  Filled 2022-03-12: qty 10

## 2022-03-12 MED ORDER — SUCCINYLCHOLINE CHLORIDE 200 MG/10ML IV SOSY
PREFILLED_SYRINGE | INTRAVENOUS | Status: AC
  Filled 2022-03-12: qty 10

## 2022-03-12 MED ORDER — PIPERACILLIN-TAZOBACTAM 3.375 G IVPB
3.3750 g | Freq: Two times a day (BID) | INTRAVENOUS | Status: DC
  Administered 2022-03-12 – 2022-03-15 (×6): 3.375 g via INTRAVENOUS
  Filled 2022-03-12 (×6): qty 50

## 2022-03-12 MED ORDER — KETAMINE HCL 50 MG/5ML IJ SOSY
PREFILLED_SYRINGE | INTRAMUSCULAR | Status: AC
  Filled 2022-03-12: qty 5

## 2022-03-12 MED ORDER — LACTULOSE ENEMA
300.0000 mL | Freq: Two times a day (BID) | ORAL | Status: DC
  Administered 2022-03-12: 300 mL via RECTAL
  Filled 2022-03-12 (×5): qty 300

## 2022-03-12 MED ORDER — FENTANYL CITRATE PF 50 MCG/ML IJ SOSY
PREFILLED_SYRINGE | INTRAMUSCULAR | Status: AC
  Filled 2022-03-12: qty 2

## 2022-03-12 MED ORDER — SODIUM CHLORIDE 0.9% IV SOLUTION: Freq: Once | INTRAVENOUS | Status: AC

## 2022-03-12 MED ORDER — PIPERACILLIN-TAZOBACTAM 3.375 G IVPB
3.3750 g | Freq: Once | INTRAVENOUS | Status: AC
  Administered 2022-03-12: 3.375 g via INTRAVENOUS
  Filled 2022-03-12: qty 50

## 2022-03-12 MED ORDER — ORAL CARE MOUTH RINSE
15.0000 mL | OROMUCOSAL | Status: DC
  Administered 2022-03-12: 15 mL via OROMUCOSAL

## 2022-03-12 MED ORDER — VASOPRESSIN 20 UNITS/100 ML INFUSION FOR SHOCK: 0.0000 [IU]/min | INTRAVENOUS | Status: DC

## 2022-03-12 MED ORDER — FUROSEMIDE 10 MG/ML IJ SOLN
20.0000 mg | Freq: Once | INTRAMUSCULAR | Status: AC
  Administered 2022-03-12: 20 mg via INTRAVENOUS
  Filled 2022-03-12: qty 2

## 2022-03-12 MED ORDER — VASOPRESSIN 20 UNITS/100 ML INFUSION FOR SHOCK
INTRAVENOUS | Status: AC
  Administered 2022-03-12: 0.03 [IU]/min via INTRAVENOUS
  Filled 2022-03-12: qty 100

## 2022-03-12 MED ORDER — FENTANYL CITRATE (PF) 100 MCG/2ML IJ SOLN
INTRAMUSCULAR | Status: AC
  Filled 2022-03-12: qty 2

## 2022-03-12 MED ORDER — NOREPINEPHRINE 16 MG/250ML-% IV SOLN
0.0000 ug/min | INTRAVENOUS | Status: DC
  Administered 2022-03-12: 16 ug/min via INTRAVENOUS
  Administered 2022-03-13: 8 ug/min via INTRAVENOUS
  Administered 2022-03-15: 13 ug/min via INTRAVENOUS
  Filled 2022-03-12 (×3): qty 250

## 2022-03-12 MED ORDER — FENTANYL 2500MCG IN NS 250ML (10MCG/ML) PREMIX INFUSION
0.0000 ug/h | INTRAVENOUS | Status: DC
  Administered 2022-03-12: 50 ug/h via INTRAVENOUS
  Administered 2022-03-13: 100 ug/h via INTRAVENOUS
  Administered 2022-03-14: 200 ug/h via INTRAVENOUS
  Administered 2022-03-15: 225 ug/h via INTRAVENOUS
  Filled 2022-03-12 (×5): qty 250

## 2022-03-12 NOTE — Progress Notes (Addendum)
Responded to nursing call:  pt developed resp distress with lactulose enema   Subjective: Not possible due to intubation  Vitals:   03/12/22 1000 03/12/22 1015 03/12/22 1030 03/12/22 1045  BP: 122/66 (!) 100/57 (!) 99/48 (!) 94/51  Pulse: 88 80 70 79  Resp: (!) 22 (!) '31 17 19  '$ Temp:      TempSrc:      SpO2: 92% 94% 94% 90%  Weight:      Height:       CV--RRR Lung--bilateral rhonchi Abd--soft+BS, distended   Assessment/Plan: Acute respiratory failure with hypoxia -developed distress during lactulose enema -reintubated by Dr. Matilde Sprang -stat PCXR -during intubation--lots of blood in oropharynx -PCCM consulted -continue zosyn>>aspiration -trach asp for culture  UGIB -RN reports hematochezia with attempted lactulose enema -transfuse 2 more units PRBC -transfuse 1 unit FFP -transfuse 1 unit platelets -GI team updated -remains on octreotide/pantoprazole drips -continue SBP prophylaxis -continue serial h/h  GOC -palliative has been consulted  Significant other and son updated    Orson Eva, DO [facility]

## 2022-03-12 NOTE — ED Provider Notes (Signed)
A CODE BLUE was called and I responded from the emergency department.  Patient was in active respiratory distress after pulling out his ET tube earlier this morning.  Patient saturating 51% on my arrival with a good Pleth.  Patient required emergent reintubation which was complicated by significant amount of blood in the airway.  Patient had significant aspiration of the secretions prior to my intubation and intubation was successful using a 7.5 ET tube.  Placement confirmed by ET tube and oxygen saturations improved to the mid 90s but after approximately 10 minutes of observation, patient began to become hypoxic again with oxygen saturations in the 60s.  I disconnected the patient from the ventilator and bagged the patient with the assistance of respiratory therapy performing copious suction and removing copious orange frothy sputum.  Suspect significant aspiration but patient's oxygen saturations ultimately improved and patient was switched to pressure control.  Care handed back to the ICU hospitalist.  Procedure Name: Intubation Date/Time: 03/12/2022 2:30 PM  Performed by: Teressa Lower, MDPre-anesthesia Checklist: Patient identified, Patient being monitored, Emergency Drugs available, Timeout performed and Suction available Oxygen Delivery Method: Ambu bag Preoxygenation: Pre-oxygenation with 100% oxygen Induction Type: Rapid sequence Ventilation: Mask ventilation without difficulty Laryngoscope Size: Glidescope and 3 Grade View: Grade III Tube size: 7.5 mm Number of attempts: 2 Airway Equipment and Method: Video-laryngoscopy Placement Confirmation: ETT inserted through vocal cords under direct vision, CO2 detector and Breath sounds checked- equal and bilateral Secured at: 25 cm Tube secured with: ETT holder Dental Injury: Bloody posterior oropharynx  Difficulty Due To: Difficulty was anticipated Comments: Copious blood in the airway       CRITICAL CARE Performed by: Teressa Lower   Total critical care time: 45 minutes  Critical care time was exclusive of separately billable procedures and treating other patients.  Critical care was necessary to treat or prevent imminent or life-threatening deterioration.  Critical care was time spent personally by me on the following activities: development of treatment plan with patient and/or surrogate as well as nursing, discussions with consultants, evaluation of patient's response to treatment, examination of patient, obtaining history from patient or surrogate, ordering and performing treatments and interventions, ordering and review of laboratory studies, ordering and review of radiographic studies, pulse oximetry and re-evaluation of patient's condition.    Teressa Lower, MD 03/12/22 1431

## 2022-03-12 NOTE — Progress Notes (Signed)
Patient became agitated and was trying to get out of bed and trying to pull his tube . Inj Fentanyl 57mg pushed and inj Precedex increased to 0.9 from 0.750m/kg/hr. patient pulled out his tube, though and was kept in Non rebreather mask. Dr. TaCarles Colletade aware and he came in, RT called and was there. Coughing up small amount of blood. Sats around 88-90% under NRB so 10 litre HFNC applied. Patient stabilized. Will continue to monitor.

## 2022-03-12 NOTE — Progress Notes (Signed)
Had family meeting at bedside with son, significant other, brother. Updated family on events of today We discussed the patient's overall poor prognosis in the setting of his liver cirrhosis and recurrent life threatening bleed and multisystem organ failure in the setting of his numerous other co-morbidities.   We discussed the patient has received full aggressive medical care and continues to deteriorate with multisystem failure.  Advance care planning, including the explanation and discussion of advance directives was carried out with the patient and family.  Code status including explanations of "Full Code" and "DNR" and alternatives were discussed in detail.  Discussion of end-of-life issues including but not limited palliative care, hospice care and the concept of hospice, other end-of-life care options, power of attorney for health care decisions, living wills, and physician orders for life-sustaining treatment were also discussed with the patient and family.  Total face to face time 16 minutes.  Orson Eva, DO

## 2022-03-12 NOTE — Progress Notes (Signed)
Pharmacy Antibiotic Note  Edward [name] is a 71 y.o. male admitted on 03/28/2022 with  aspiration pneumonia .  Pharmacy has been consulted for Zosyn dosing.  Plan: Zosyn 3.375g IV every 12 hours.  Height: '5\' 10"'$  (177.8 cm) Weight: 70 kg (154 lb 5.2 oz) IBW/kg (Calculated) : 73  Temp (24hrs), Avg:98 F (36.7 C), Min:97.6 F (36.4 C), Max:98.7 F (37.1 C)  Recent Labs  Lab 03/09/2022 1427 03/31/2022 0756 03/12/22 0140  WBC 6.0 6.2 4.6  CREATININE 3.52* 3.99* 3.55*    Estimated Creatinine Clearance: 19.4 mL/min (A) (by C-G formula based on SCr of 3.55 mg/dL (H)).    No Known Allergies  Antimicrobials this admission: Zosyn 11/6 >> CTX 11/4 >>11/5   Microbiology results: 11/5 UCx: pending  11/5 Tracheal Aspirate: pending 11/5 MRSA PCR: negative  Thank you for allowing pharmacy to be a part of this patient's care.  Ramond Craver 03/12/2022 12:38 PM

## 2022-03-12 NOTE — Progress Notes (Addendum)
Initial Nutrition Assessment  DOCUMENTATION CODES:      INTERVENTION:  If patient unable to wean and alternate nutrition support desired  - consider TPN   NUTRITION DIAGNOSIS:   Inadequate oral intake related to inability to eat as evidenced by NPO status.   GOAL:  Provide needs based on ASPEN/SCCM guidelines   MONITOR:  Vent status, Labs, I & O's, Skin, Weight trends  REASON FOR ASSESSMENT:   Malnutrition Screening Tool, Ventilator    ASSESSMENT: Patient is currently intubated on ventilator support. He self extubated and was re-intubated. Talked with nursing. Patient doesn't have OGT/NGT placed.   History of CKD-3, DM2, CAD, Wilson's dz, Malnutrition, decompensated liver cirrhosis with ascites and esophageal varices -Grade 3 (recurrent bleeding) and 11/5 -banding.  Patient also requiring recurrent paracentesis (10/24-5.4 liters removed).    Palliative Team consulted- [place]. Family wishes full scope of care.  MV: 7.4  L/min Temp (24hrs), Avg:97.3 F (36.3 C), Min:96.7 F (35.9 C), Max:97.9 F (36.6 C) MAP-104 Precedex: 0 ml/hr   Intake/Output Summary (Last 24 hours) at 03/12/2022 1529 Last data filed at 03/12/2022 1449 Gross per 24 hour  Intake 2964.28 ml  Output 1520 ml  Net 1444.28 ml    Weights: patient has acute weight loss (3.8%) 2.7 kg x 2 weeks.   Medications: docusate,insulin, chronulac, miralax. IV-albumin 25% (25 gr) every 6 hours 11/5->11/7 d/c 0159   CBG (last 3)  Recent Labs    03/12/22 0328 03/12/22 0744 03/12/22 1104  GLUCAP 252* 230* 233*        Latest Ref Rng & Units 03/12/2022    1:40 AM 03/29/2022    2:00 PM 03/24/2022    7:56 AM  BMP  Glucose 70 - 99 mg/dL 258   226   BUN 8 - 23 mg/dL 69   64   Creatinine 0.61 - 1.24 mg/dL 3.55   3.99   Sodium 135 - 145 mmol/L 134   134   Potassium 3.5 - 5.1 mmol/L 4.9  4.9  5.3   Chloride 98 - 111 mmol/L 101   100   CO2 22 - 32 mmol/L 22   16   Calcium 8.9 - 10.3 mg/dL 8.6   8.6       Diet  Order:   Diet Order             Diet NPO time specified  Diet effective now                   EDUCATION NEEDS:  Not appropriate for education at this time  Skin:  Skin Assessment: Reviewed RN Assessment  Last BM:  11/6 Type 7 large black stool  Height:   Ht Readings from Last 1 Encounters:  03/12/22 '5\' 10"'$  (1.778 m)    Weight:   Wt Readings from Last 1 Encounters:  03/12/22 70 kg    Ideal Body Weight:   75 kg  BMI:  Body mass index is 22.14 kg/m.  Estimated Nutritional Needs:   Kcal:  1776-2000   Protein:  98-105 gr  Fluid:  2 liters daily   Colman Cater MS,RD,CSG,LDN Contact: Shea Evans

## 2022-03-12 NOTE — Progress Notes (Signed)
Per Dr. Carles Collet, HOLD last unit of blood for now. Family has discussed plan of care with Dr. Carles Collet, Dr. Abbey Chatters, [place], and attending nurses. As for right now they still want to pursue full scope/ full treatment. Will continue to monitor.

## 2022-03-12 NOTE — Consult Note (Signed)
NAME:  Edward [name], MRN:  [id number], DOB:  1950-07-12, LOS: 2 ADMISSION DATE:  04/03/2022, CONSULTATION DATE:  03/12/22  REFERRING MD:  Tat/ [facility]ad, CHIEF COMPLAINT:  acute resp failure / vent dep p EGD for varices   History of Present Illness:  89 yowm with near endstage cirrhosis with ascites /varices admit 11/4 with hematemeis > banding varices 11/5 and self extubated 11/6 am and re-ET sev hours later and PCCM asked to consult.   Pertinent  Medical History   24 yowm  with medical history significant of decompensated cirrhosis of the liver with ascites and esophageal varices, type 2 diabetes, stage III chronic kidney disease, coronary artery disease with stents, hypertension, history of MI, hyperlipidemia.  Patient brought to the hospital due to spitting up blood x 1.  Due to concerns, his wife called EMS and the patient was brought to the hospital for evaluation.  Patient had been hospitalized recently for extensive esophageal variceal bleeding requiring multiple units of blood transfusion and admission to the ICU.  The patient's has not had any more hematemesis or hematochezia.  He does have extensive ascites and he stated that his abdomen felt  tight.         Significant Hospital Events: Including procedures, antibiotic start and stop dates in addition to other pertinent events   EGD  11/5 > Grade 3 varieces > banding and remained intubated R IJ  11/5  Self extubated am 11/6 and reintubated noon 11/6    Scheduled Meds:  sodium chloride   Intravenous Once   brimonidine  1 drop Both Eyes BID   Chlorhexidine Gluconate Cloth  6 each Topical Daily   docusate  100 mg Per Tube BID   insulin aspart  0-6 Units Subcutaneous Q4H   lactulose  300 mL Rectal BID   midodrine  20 mg Oral TID WC   mouth rinse  15 mL Mouth Rinse Q4H   pantoprazole (PROTONIX) IV  40 mg Intravenous Once   polyethylene glycol  17 g Per Tube Daily   Continuous Infusions:  sodium chloride     albumin human 25 g  (03/12/22 1311)   dexmedetomidine (PRECEDEX) IV infusion Stopped (03/12/22 1100)   norepinephrine (LEVOPHED) Adult infusion 3 mcg/min (03/12/22 1219)   octreotide (SANDOSTATIN) 500 mcg in sodium chloride 0.9 % 250 mL (2 mcg/mL) infusion 50 mcg/hr (03/12/22 1219)   pantoprazole 8 mg/hr (03/12/22 1219)   [START ON 03/13/2022] piperacillin-tazobactam (ZOSYN)  IV     PRN Meds:.fentaNYL (SUBLIMAZE) injection, fentaNYL (SUBLIMAZE) injection, ipratropium-albuterol, ondansetron **OR** ondansetron (ZOFRAN) IV, mouth rinse    Interim History / Subjective:  Sedated and not breathing over back up rate on PCV   Objective   Blood pressure (!) 144/81, pulse (!) 137, temperature (!) 97 F (36.1 C), resp. rate 20, height '5\' 10"'$  (1.778 m), weight 70 kg, SpO2 93 %. CVP:  [1 mmHg-49 mmHg] 4 mmHg  Vent Mode: PRVC FiO2 (%):  [40 %-100 %] 100 % Set Rate:  [14 bmp] 14 bmp Vt Set:  [550 mL] 550 mL PEEP:  [5 cmH20] 5 cmH20 Plateau Pressure:  [20 cmH20-23 cmH20] 22 cmH20   Intake/Output Summary (Last 24 hours) at 03/12/2022 1324 Last data filed at 03/12/2022 1300 Gross per 24 hour  Intake 2202.41 ml  Output 1520 ml  Net 682.41 ml   Filed Weights   03/22/2022 1324 03/12/22 0610  Weight: 65.8 kg 70 kg    Examination: Tmax:  98.7 General appearance:    sedated /  not breathing above back up     No jvd Oropharynx et  Neck supple Lungs  coarse bs L >> R s wheeze  RRR no s3 or or sign murmur Abd marked distention with limited excursion  Extr warm with min edema or clubbing noted Neuro  Sensorium sedated ,  no apparent motor deficits     I personally reviewed images and agree with radiology impression as follows:  CXR:   portable 11/6 p et Endotracheal tube is in grossly good position. Right internal jugular catheter is unchanged. Significantly increased bilateral lung opacities are noted concerning for pneumonia or possibly edema. Small bilateral pleural effusions are most likely present      Assessment & Plan:  1) Acute resp failure/ vent dep p EGD/self extubation with likely significant aspiration injury in pt with near endstage Cirrhosis - Zosyn 11/6 >>> >>> PCV ok to use but need Vt in 500-600 range if tolerates / use peep / fio2 settings from ards protocol (the lower range one)   2) AKI superimposed on CRI with risk circulatory shock making things much worse here Lab Results  Component Value Date   CREATININE 3.55 (H) 03/12/2022   CREATININE 3.99 (H) 03/30/2022   CREATININE 3.52 (H) 04/01/2022   >>>volume exp to cvp 8-10 and min use of pressors /toxins    3) acute blood loss anemia / thrombocytopenia / coagulopathy  Lab Results  Component Value Date   HGB 11.2 (L) 03/12/2022   HGB 8.6 (L) 03/12/2022   HGB 8.7 (L) 03/12/2022   HGB 12.2 (L) 11/14/2021   HGB 12.5 (L) 08/15/2021   HGB 11.9 (L) 05/23/2021    Lab Results  Component Value Date   INR 1.6 (H) 03/12/2022   INR 2.1 (H) 03/18/2022   INR 1.5 (H) 03/21/2022    >>> transfusions already ordered per triad  4) Prognosis looks very poor to me and medical science may have done it all it can here  - fm at bedside planning on meeting with Dr Tat this pm, would consider DNR status as if he codes in this setting it will likely just prolong the dying process.  This is not the same as w/d of care, which I'll certain defer on for now unless fm starts pushing for a more palliative approach.     Labs   CBC: Recent Labs  Lab 03/13/2022 1427 03/16/2022 0756 03/23/2022 1400 03/31/2022 1959 03/12/22 0140 03/12/22 0742 03/12/22 1203  WBC 6.0 6.2  --   --  4.6  --  6.7  HGB 10.1* 6.1* 9.0* 8.3* 8.7* 8.6* 11.2*  HCT 30.4* 18.8* 25.4* 23.9* 25.2* 25.4* 34.1*  MCV 97.7 100.5*  --   --  91.3  --  96.3  PLT 61* 56*  --   --  46*  --  62*    Basic Metabolic Panel: Recent Labs  Lab 03/30/2022 1427 03/29/2022 0756 03/09/2022 1400 03/12/22 0140  NA 132* 134*  --  134*  K 5.5* 5.3* 4.9 4.9  CL 98 100  --  101  CO2 19* 16*   --  22  GLUCOSE 239* 226*  --  258*  BUN 46* 64*  --  69*  CREATININE 3.52* 3.99*  --  3.55*  CALCIUM 8.6* 8.6*  --  8.6*   GFR: Estimated Creatinine Clearance: 19.4 mL/min (A) (by C-G formula based on SCr of 3.55 mg/dL (H)). Recent Labs  Lab 03/14/2022 1427 03/13/2022 0756 04/04/2022 1400 03/12/22 0140 03/12/22 0742 03/12/22 1203  PROCALCITON  --   --  0.77  --  0.64  --   WBC 6.0 6.2  --  4.6  --  6.7    Liver Function Tests: Recent Labs  Lab 03/21/2022 1427 03/08/2022 0756 03/12/22 0140  AST 71* 42* 32  ALT <5 32 26  ALKPHOS 78 42 45  BILITOT 3.5* 3.5* 9.1*  PROT 5.9* 5.4* 5.8*  ALBUMIN 3.1* 3.8 4.1   No results for input(s): "LIPASE", "AMYLASE" in the last 168 hours. Recent Labs  Lab 03/12/2022 1427 03/24/2022 1417 03/12/22 0140  AMMONIA 34 60* 41*    ABG    Component Value Date/Time   PHART 7.42 03/12/2022 0504   PCO2ART 38 03/12/2022 0504   PO2ART 60 (L) 03/12/2022 0504   HCO3 24.6 03/12/2022 0504   TCO2 20 (L) 02/20/2022 0638   ACIDBASEDEF 4.0 (H) 02/20/2022 0638   O2SAT 92.8 03/12/2022 0504     Coagulation Profile: Recent Labs  Lab 03/25/2022 1427 03/08/2022 0756 03/12/22 0140  INR 1.5* 2.1* 1.6*    Cardiac Enzymes: No results for input(s): "CKTOTAL", "CKMB", "CKMBINDEX", "TROPONINI" in the last 168 hours.  HbA1C: HB A1C (BAYER DCA - WAIVED)  Date/Time Value Ref Range Status  11/14/2021 08:18 AM 6.7 (H) 4.8 - 5.6 % Final    Comment:             Prediabetes: 5.7 - 6.4          Diabetes: >6.4          Glycemic control for adults with diabetes: <7.0   08/15/2021 09:01 AM 7.3 (H) 4.8 - 5.6 % Final    Comment:             Prediabetes: 5.7 - 6.4          Diabetes: >6.4          Glycemic control for adults with diabetes: <7.0    Hgb A1c MFr Bld  Date/Time Value Ref Range Status  03/30/2022 02:00 PM 5.3 4.8 - 5.6 % Final    Comment:    (NOTE) Pre diabetes:          5.7%-6.4%  Diabetes:              >6.4%  Glycemic control for   <7.0% adults  with diabetes   11/27/2021 03:00 PM 6.5 (H) 4.8 - 5.6 % Final    Comment:    (NOTE) Pre diabetes:          5.7%-6.4%  Diabetes:              >6.4%  Glycemic control for   <7.0% adults with diabetes     CBG: Recent Labs  Lab 03/29/2022 1948 03/12/22 0009 03/12/22 0328 03/12/22 0744 03/12/22 1104  GLUCAP 263* 250* 252* 230* 233*     He,  has a past medical history of Cataract, CKD (chronic kidney disease) stage 3, GFR 30-59 ml/min (HCC), Coronary atherosclerosis of native coronary artery, Essential hypertension, Glaucoma, Low back pain, MI (myocardial infarction) ([place]), Mixed hyperlipidemia, Renal insufficiency, and Type 2 diabetes mellitus ([place]).   Surgical History:   Past Surgical History:  Procedure Laterality Date   CATARACT EXTRACTION, BILATERAL     CORONARY ANGIOPLASTY WITH STENT PLACEMENT  2006   ESOPHAGEAL BANDING  02/01/2022   Procedure: ESOPHAGEAL BANDING;  Surgeon: Eloise Harman, DO;  Location: [facility];  Service: Endoscopy;;   ESOPHAGEAL BANDING  02/20/2022   Procedure: ESOPHAGEAL BANDING;  Surgeon: Ladene Artist, MD;  Location: [facility];  Service: Gastroenterology;;   ESOPHAGOGASTRODUODENOSCOPY (EGD) WITH PROPOFOL N/A 02/01/2022   Procedure: ESOPHAGOGASTRODUODENOSCOPY (EGD) WITH PROPOFOL;  Surgeon: Eloise Harman, DO;  Location: [facility];  Service: Endoscopy;  Laterality: N/A;  12:30 am   ESOPHAGOGASTRODUODENOSCOPY (EGD) WITH PROPOFOL N/A 02/20/2022   Procedure: ESOPHAGOGASTRODUODENOSCOPY (EGD) WITH PROPOFOL;  Surgeon: Ladene Artist, MD;  Location: [facility] ENDOSCOPY;  Service: Gastroenterology;  Laterality: N/A;   IR PARACENTESIS  02/27/2022     Social History:   reports that he quit smoking about 34 years ago. His smoking use included cigarettes. He has a 40.00 pack-year smoking history. He has been exposed to tobacco smoke. He has never used smokeless tobacco. He reports that he does not drink alcohol and does not use drugs.   Family  History:  His family history includes Aneurysm in his father; Cancer in his brother; Diabetes in his brother, brother, sister, and sister; Heart disease in his mother; Lung cancer in his mother.   Allergies No Known Allergies   Home Medications  Prior to Admission medications   Medication Sig Start Date End Date Taking? Authorizing Provider  albuterol (VENTOLIN HFA) 108 (90 Base) MCG/ACT inhaler Inhale 1-2 puffs into the lungs every 6 (six) hours as needed for wheezing or shortness of breath. 01/23/22  Yes Triplett, Tammy, PA-C  brimonidine (ALPHAGAN) 0.2 % ophthalmic solution Place 1 drop into both eyes 2 (two) times daily. 01/09/22  Yes [provider]  furosemide (LASIX) 40 MG tablet Take 1 tablet by mouth once daily 03/09/22  Yes Hassell Done, Mary-Margaret, FNP  lactulose (CHRONULAC) 10 GM/15ML solution Take 15 mLs (10 g total) by mouth 2 (two) times daily. Patient taking differently: Take 10 g by mouth 2 (two) times daily. Can take additional dose if needed for constipation 02/09/22  Yes Hawks, Christy A, FNP  linagliptin (TRADJENTA) 5 MG TABS tablet Take 1 tablet (5 mg total) by mouth daily. 02/09/22  Yes Hawks, Christy A, FNP  midodrine (PROAMATINE) 10 MG tablet Take 2 tablets (20 mg total) by mouth 3 (three) times daily with meals. 01/12/22  Yes Hassell Done, Mary-Margaret, FNP  nitroGLYCERIN (NITROSTAT) 0.4 MG SL tablet Place 1 tablet (0.4 mg total) under the tongue every 5 (five) minutes x 3 doses as needed for chest pain (if no relief after 2nd dose, proceed to ED or call 911). 02/19/22  Yes Satira Sark, MD  pantoprazole (PROTONIX) 40 MG tablet Take 1 tablet (40 mg total) by mouth 2 (two) times daily. 02/01/22 02/01/23 Yes Carver, Elon Alas, DO  potassium chloride (KLOR-CON) 10 MEQ tablet Take 1 tablet by mouth once daily 03/09/22  Yes Martin, Mary-Margaret, FNP  sodium bicarbonate 650 MG tablet Take 1 tablet (650 mg total) by mouth 3 (three) times daily. 11/22/21  Yes Barton Dubois, MD   tamsulosin (FLOMAX) 0.4 MG CAPS capsule TAKE 1 CAPSULE BY MOUTH ONCE DAILY AFTER SUPPER Patient taking differently: Take 0.4 mg by mouth daily after supper. 03/09/22  Yes Martin, Mary-Margaret, FNP  traZODone (DESYREL) 50 MG tablet Take 1 tablet (50 mg total) by mouth at bedtime. 02/05/22  Yes Hassell Done, Mary-Margaret, FNP    The patient is critically ill with multiple organ systems failure and requires high complexity decision making for assessment and support, frequent evaluation and titration of therapies, application of advanced monitoring technologies and extensive interpretation of multiple databases. Critical Care Time devoted to patient care services described in this note is 45  minutes.   Christinia Gully, MD Pulmonary and Critical  [place] [facility] [phone]   After 7:00 pm call Elink  [phone]

## 2022-03-12 NOTE — TOC Progression Note (Signed)
  Transition of Care [facility]) Screening Note   Patient Details  Name: Edward [name] [facility] Date of Birth: 15-May-1950   Transition of Care [facility]) CM/SW Contact:    Shade Flood, LCSW Phone Number: 03/12/2022, 12:34 PM    Pt has high readmission risk score. Pt has been intubated, self extubated, and now re-intubated. Palliative Care involved. TOC will follow and further assess and assist as goals of care are identified.  Transition of Care Department [facility]) has reviewed patient and no TOC needs have been identified at this time. We will continue to monitor patient advancement through interdisciplinary progression rounds. If new patient transition needs arise, please place a TOC consult.

## 2022-03-12 NOTE — Progress Notes (Signed)
Provided support during rapid response today. Patient was intubated and family arrived within a few hours. Chaplain provided spiritual presence, opportunity for reflection and prayer bedside today. They heard from Dr. Carles Collet, Dr. Abbey Chatters and do understand that Mr. Tweed is dying. The son reported that his father told him that he did not want to be intubated long-term at EOL. Other family members present nodded their heads including the wife. This family needs time to absorb todays events and the truth of Keeon's dying. The wife shared that she would like for Edward [name]'s nephew to get a chance to come to the hospital and she is anticipating having a conversation about next steps tomorrow. Full scope of care at this time.   Chaplain provided support and will continue to remain available in order to provide spiritual support and to assess for spiritual need.   Rev. Bennie Pierini, M.Div. Chaplain

## 2022-03-12 NOTE — Progress Notes (Signed)
Patient noted to have bright red blood coming from mouth that measured approximately 100 ml.  Hgb was rechecked and results were Hgb - 8.7 up from 8.3.  The patient's VS have remained stable with no adverse effects of bleeding noted.  Currently no further signs of bleeding noted.  Dr. Josephine Cables has been made aware of this occurrence.

## 2022-03-12 NOTE — Consult Note (Signed)
Consultation Note Date: 03/12/2022   Patient Name: Bon Dowis [facility]  DOB: November 17, 1950  MRN: [id number]  Age / Sex: 71 y.o., male  PCP: [name], FNP Referring Physician: Orson Eva, MD  Reason for Consultation: Establishing goals of care  HPI/Patient Profile: 71 y.o. male  with past medical history of decompensated cirrhosis of the liver with ascites and esophageal varices with ongoing paracentesis, esophageal banding September 28 and October 17 of this year DM 2, CKD 3, CAD with stents, HTN/HLD, history of MI, low back pain, esophageal banding admitted on 03/31/2022 with acute renal failure on CKD 3, respiratory failure.   Clinical Assessment and Goals of Care: I have reviewed medical records including EPIC notes, labs and imaging, received report from RN, assessed the patient.  Mr. Skaggs, [place], is resting quietly in bed.  He appears acutely/chronically ill and quite frail.  He is alert and oriented x3, able to make his needs known.  There is no family at bedside at this time.  We meet at bedside to discuss diagnosis prognosis, GOC, EOL wishes, disposition and options.  I introduced Palliative Medicine as specialized medical care for people living with serious illness. It focuses on providing relief from the symptoms and stress of a serious illness. The goal is to improve quality of life for both the patient and the family.  We focused on their current illness.  We talk about his acute illness and the treatment plan.  Mr. Tejada is acutely ill.  He was intubated, but self extubated this morning.  The natural disease trajectory and expectations at EOL were discussed.  Advanced directives, concepts specific to code status, artifical feeding and hydration, and rehospitalization were considered and discussed.  At this point, Mr. Moeller tells me that he would want to be reintubated.  He is unable to set  limits on life support.  Discussed the importance of continued conversation with family and the medical providers regarding overall plan of care and treatment options, ensuring decisions are within the context of the patient's values and GOCs.  Questions and concerns were addressed.  The family was encouraged to call with questions or concerns.  PMT will continue to support holistically.  CODE BLUE called and Mr. Marczak is reintubated.  PMT to follow.  Conference with attending, bedside nursing staff, transition of care team related to patient condition, needs, goals of care, disposition.    HCPOA  NEXT OF KIN -Mr. Derosa states that his ex-wife, Rupert Azzara makes healthcare choices for him if he cannot.  He states that he has a brother, Charlotte Crumb.  When asked about his children, he tells me that he has 2 sons, Tommie and [place].    SUMMARY OF RECOMMENDATIONS   Full scope/full code Unable to set limits on life support Time for outcomes   Code Status/Advance Care Planning: Full code  Symptom Management:  Per hospitalist, no additional needs at this time.  Palliative Prophylaxis:  Oral Care and Turn Reposition  Additional Recommendations (Limitations, Scope, Preferences): Full Scope Treatment  Psycho-social/Spiritual:  Desire for further Chaplaincy support:no Additional Recommendations: Caregiving  Support/Resources and ICU Family Guide  Prognosis:  Unable to determine, guarded.  In hospital death in the next few days would not be surprising.  Discharge Planning: To be determined, based on outcomes.  In hospital death would not be surprising.      Primary Diagnoses: Present on Admission:  Acute renal failure superimposed on stage 3b chronic kidney disease (HCC)  Decompensated hepatic cirrhosis (HCC)  CKD (chronic kidney disease) stage 3B, GFR 30-59 ml/min (HCC)  Thrombocytopenia (HCC)  Essential hypertension  Type 2 diabetes with nephropathy (HCC)  Hypoalbuminemia   Secondary esophageal varices without bleeding (HCC)  Cirrhosis of liver with ascites (HCC)  Hemorrhagic shock (HCC)  Upper GI bleeding  Esophageal varices with bleeding in diseases classified elsewhere [facility])   I have reviewed the medical record, interviewed the patient and family, and examined the patient. The following aspects are pertinent.  Past Medical History:  Diagnosis Date   Cataract    CKD (chronic kidney disease) stage 3, GFR 30-59 ml/min (HCC)    Coronary atherosclerosis of native coronary artery    BMS LAD 2006; residual 75% distal RCA; EF 50%   Essential hypertension    Glaucoma    Low back pain    MI (myocardial infarction) ([place])    Anterior 2006   Mixed hyperlipidemia    Renal insufficiency    Type 2 diabetes mellitus ([place])    Social History   Socioeconomic History   Marital status: Legally Separated    Spouse name: [name]    Number of children: 2   Years of education: 7th   Highest education level: 7th grade  Occupational History   Occupation: Retired  Tobacco Use   Smoking status: Former    Packs/day: 2.00    Years: 20.00    Total pack years: 40.00    Types: Cigarettes    Quit date: 05/08/1987    Years since quitting: 34.8    Passive exposure: Past   Smokeless tobacco: Never  Vaping Use   Vaping Use: Never used  Substance and Sexual Activity   Alcohol use: No    Alcohol/week: 0.0 standard drinks of alcohol   Drug use: No   Sexual activity: Not Currently  Other Topics Concern   Not on file  Social History Narrative   Lives alone    Social Determinants of Health   Financial Resource Strain: Low Risk  (07/11/2021)   Overall Financial Resource Strain (CARDIA)    Difficulty of Paying Living Expenses: Not very hard  Food Insecurity: No Food Insecurity (04/05/2022)   Hunger Vital Sign    Worried About Running Out of Food in the Last Year: Never true    Ran Out of Food in the Last Year: Never true  Transportation Needs: No Transportation Needs  (03/09/2022)   PRAPARE - Hydrologist (Medical): No    Lack of Transportation (Non-Medical): No  Physical Activity: Insufficiently Active (07/11/2021)   Exercise Vital Sign    Days of Exercise per Week: 7 days    Minutes of Exercise per Session: 20 min  Stress: No Stress Concern Present (07/11/2021)   [place]    Feeling of Stress : Not at all  Social Connections: Socially Isolated (07/11/2021)   Social Connection and Isolation Panel [NHANES]    Frequency of Communication with Friends and Family: More than three times a week    Frequency  of Social Gatherings with Friends and Family: More than three times a week    Attends Religious Services: Never    Marine scientist or Organizations: No    Attends Music therapist: Never    Marital Status: Separated   Family History  Problem Relation Age of Onset   Aneurysm Father        Brain aneurysm   Lung cancer Mother        Small cell carcinoma of lung,kidney and heart   Heart disease Mother    Cancer Brother        lungs   Diabetes Sister    Diabetes Sister    Diabetes Brother    Diabetes Brother    Scheduled Meds:  sodium chloride   Intravenous Once   brimonidine  1 drop Both Eyes BID   Chlorhexidine Gluconate Cloth  6 each Topical Daily   docusate  100 mg Per Tube BID   insulin aspart  0-6 Units Subcutaneous Q4H   lactulose  300 mL Rectal BID   midodrine  20 mg Oral TID WC   mouth rinse  15 mL Mouth Rinse Q4H   pantoprazole (PROTONIX) IV  40 mg Intravenous Once   polyethylene glycol  17 g Per Tube Daily   Continuous Infusions:  sodium chloride     albumin human 25 g (03/12/22 1311)   dexmedetomidine (PRECEDEX) IV infusion Stopped (03/12/22 1100)   norepinephrine (LEVOPHED) Adult infusion 3 mcg/min (03/12/22 1219)   octreotide (SANDOSTATIN) 500 mcg in sodium chloride 0.9 % 250 mL (2 mcg/mL) infusion 50 mcg/hr  (03/12/22 1219)   pantoprazole 8 mg/hr (03/12/22 1219)   [START ON 03/13/2022] piperacillin-tazobactam (ZOSYN)  IV     PRN Meds:.fentaNYL (SUBLIMAZE) injection, fentaNYL (SUBLIMAZE) injection, ipratropium-albuterol, ondansetron **OR** ondansetron (ZOFRAN) IV, mouth rinse Medications Prior to Admission:  Prior to Admission medications   Medication Sig Start Date End Date Taking? Authorizing Provider  albuterol (VENTOLIN HFA) 108 (90 Base) MCG/ACT inhaler Inhale 1-2 puffs into the lungs every 6 (six) hours as needed for wheezing or shortness of breath. 01/23/22  Yes Triplett, Tammy, PA-C  brimonidine (ALPHAGAN) 0.2 % ophthalmic solution Place 1 drop into both eyes 2 (two) times daily. 01/09/22  Yes [provider]  furosemide (LASIX) 40 MG tablet Take 1 tablet by mouth once daily 03/09/22  Yes Hassell Done, Mary-Margaret, FNP  lactulose (CHRONULAC) 10 GM/15ML solution Take 15 mLs (10 g total) by mouth 2 (two) times daily. Patient taking differently: Take 10 g by mouth 2 (two) times daily. Can take additional dose if needed for constipation 02/09/22  Yes Hawks, Christy A, FNP  linagliptin (TRADJENTA) 5 MG TABS tablet Take 1 tablet (5 mg total) by mouth daily. 02/09/22  Yes Hawks, Christy A, FNP  midodrine (PROAMATINE) 10 MG tablet Take 2 tablets (20 mg total) by mouth 3 (three) times daily with meals. 01/12/22  Yes Hassell Done, Mary-Margaret, FNP  nitroGLYCERIN (NITROSTAT) 0.4 MG SL tablet Place 1 tablet (0.4 mg total) under the tongue every 5 (five) minutes x 3 doses as needed for chest pain (if no relief after 2nd dose, proceed to ED or call 911). 02/19/22  Yes Satira Sark, MD  pantoprazole (PROTONIX) 40 MG tablet Take 1 tablet (40 mg total) by mouth 2 (two) times daily. 02/01/22 02/01/23 Yes Carver, Elon Alas, DO  potassium chloride (KLOR-CON) 10 MEQ tablet Take 1 tablet by mouth once daily 03/09/22  Yes Hassell Done, Mary-Margaret, FNP  sodium bicarbonate 650 MG tablet Take  1 tablet (650 mg total) by mouth 3  (three) times daily. 11/22/21  Yes Barton Dubois, MD  tamsulosin (FLOMAX) 0.4 MG CAPS capsule TAKE 1 CAPSULE BY MOUTH ONCE DAILY AFTER SUPPER Patient taking differently: Take 0.4 mg by mouth daily after supper. 03/09/22  Yes Martin, Mary-Margaret, FNP  traZODone (DESYREL) 50 MG tablet Take 1 tablet (50 mg total) by mouth at bedtime. 02/05/22  Yes Hassell Done, Mary-Margaret, FNP   No Known Allergies Review of Systems  Unable to perform ROS: Acuity of condition    Physical Exam Vitals and nursing note reviewed.  Skin:    General: Skin is warm and dry.  Neurological:     Mental Status: He is alert and oriented to person, place, and time.  Psychiatric:        Mood and Affect: Mood normal.        Behavior: Behavior normal.     Vital Signs: BP (!) 144/81   Pulse (!) 137   Temp (!) 97 F (36.1 C)   Resp 20   Ht '5\' 10"'$  (1.778 m)   Wt 70 kg   SpO2 93%   BMI 22.14 kg/m  Pain Scale: CPOT POSS *See Group Information*: S-Acceptable,Sleep, easy to arouse Pain Score: Asleep   SpO2: SpO2: 93 % O2 Device:SpO2: 93 % O2 Flow Rate: .O2 Flow Rate (L/min): 15 L/min  IO: Intake/output summary:  Intake/Output Summary (Last 24 hours) at 03/12/2022 1327 Last data filed at 03/12/2022 1300 Gross per 24 hour  Intake 2202.41 ml  Output 1520 ml  Net 682.41 ml    LBM: Last BM Date : 03/09/2022 Baseline Weight: Weight: 65.8 kg Most recent weight: Weight: 70 kg     Palliative Assessment/Data:   Flowsheet Rows    Flowsheet Row Most Recent Value  Intake Tab   Referral Department Hospitalist  Unit at Time of Referral ICU  Palliative Care Primary Diagnosis Nephrology  Date Notified 03/12/22  Palliative Care Type Return patient Palliative Care  Reason for referral Clarify Goals of Care  Date of Admission 03/14/2022  Date first seen by Palliative Care 03/12/22  # of days Palliative referral response time 0 Day(s)  # of days IP prior to Palliative referral 2  Clinical Assessment   Palliative  Performance Scale Score 30%  Pain Max last 24 hours Not able to report  Pain Min Last 24 hours Not able to report  Dyspnea Max Last 24 Hours Not able to report  Dyspnea Min Last 24 hours Not able to report  Psychosocial & Spiritual Assessment   Palliative Care Outcomes        Time In: 1300  Time Out: 1415 Time Total: 75 minutes  Greater than 50%  of this time was spent counseling and coordinating care related to the above assessment and plan.  Signed by: Drue Novel, NP   Please contact Palliative Medicine Team phone at [phone] for questions and concerns.  For individual provider: See Shea Evans

## 2022-03-12 NOTE — Plan of Care (Signed)

## 2022-03-12 NOTE — Progress Notes (Signed)
Gastroenterology Progress Note   Referring Provider: No ref. provider found Primary Care Physician:  [name], [place] Primary Gastroenterologist:  Elon Alas. Abbey Chatters, DO  Patient ID: [name]; [id number]; 03/13/1951    Subjective   Denies abdominal pain or nausea. Does endorse dry mucous membranes, difficulty breathing. Nurse reports he self extubated this morning and has been anxious with NRB and 10L [place]. Has been coughing up yellow/pink tinged secretions frequently. Nurse denies report of any stools this morning or overnight.    Objective   Vital signs in last 24 hours Temp:  [97.6 F (36.4 C)-98.7 F (37.1 C)] 97.7 F (36.5 C) (11/06 0759) Pulse Rate:  [29-133] 76 (11/06 0915) Resp:  [13-26] 22 (11/06 0915) BP: (82-139)/(37-106) 122/50 (11/06 0915) SpO2:  [86 %-100 %] 94 % (11/06 0915) FiO2 (%):  [40 %-55 %] 55 % (11/06 0400) Weight:  [70 kg] 70 kg (11/06 0610) Last BM Date : 03/09/2022  Physical Exam General:   Alert and oriented, anxious Head:  Normocephalic and atraumatic. Eyes:  No icterus, sclera clear. Conjuctiva pink.  Mouth:  Without lesions, mucosa pink and dry. Cyanosis with frequent coughing Heart:  S1, S2 present, no murmurs noted.  Lungs: Rhonchi bilaterally, shallow, tachypnea, frequent coughing Abdomen:  Bowel sounds present, distended, firm but not taut. No HSM or hernias noted. No rebound or guarding. No masses appreciated  Extremities:  +1 edema to BLE. Neurologic:  Alert and  oriented x3; intermittently anxious Skin:  Warm and dry, scattered bruising, thin skin. Mild jaundice Psych:  Alert and anxious. Mitts present.   Intake/Output from previous day: 11/05 0701 - 11/06 0700 In: 3558 [I.V.:1256.1; Blood:1929.7; IV Piggyback:372.2] Out: 800 [Urine:800] Intake/Output this shift: Total I/O In: 178 [I.V.:122.1; IV Piggyback:55.9] Out: -   Lab Results  Recent Labs    03/09/2022 1427 03/08/2022 0756 04/04/2022 1400 03/27/2022 1959  03/12/22 0140 03/12/22 0742  WBC 6.0 6.2  --   --  4.6  --   HGB 10.1* 6.1*   < > 8.3* 8.7* 8.6*  HCT 30.4* 18.8*   < > 23.9* 25.2* 25.4*  PLT 61* 56*  --   --  46*  --    < > = values in this interval not displayed.   BMET Recent Labs    03/14/2022 1427 03/18/2022 0756 03/26/2022 1400 03/12/22 0140  NA 132* 134*  --  134*  K 5.5* 5.3* 4.9 4.9  CL 98 100  --  101  CO2 19* 16*  --  22  GLUCOSE 239* 226*  --  258*  BUN 46* 64*  --  69*  CREATININE 3.52* 3.99*  --  3.55*  CALCIUM 8.6* 8.6*  --  8.6*   LFT Recent Labs    04/04/2022 1427 04/04/2022 0756 03/12/22 0140  PROT 5.9* 5.4* 5.8*  ALBUMIN 3.1* 3.8 4.1  AST 71* 42* 32  ALT <5 32 26  ALKPHOS 78 42 45  BILITOT 3.5* 3.5* 9.1*   PT/INR Recent Labs    03/21/2022 0756 03/12/22 0140  LABPROT 23.2* 19.2*  INR 2.1* 1.6*   Hepatitis Panel No results for input(s): "HEPBSAG", "HCVAB", "HEPAIGM", "HEPBIGM" in the last 72 hours.    Studies/Results DG CHEST PORT 1 VIEW  Result Date: 03/12/2022 CLINICAL DATA:  Respiratory distress EXAM: PORTABLE CHEST 1 VIEW COMPARISON:  Previous studies including the examination done earlier today FINDINGS: Transverse diameter of heart is in the upper limits of normal. Diffuse alveolar densities are seen in left parahilar region  and left lower lung field. Increased interstitial markings are seen in right parahilar region. No significant interval changes are noted in comparison with the immediate previous study. Right lateral CP angle is clear. There is minimal blunting of left lateral CP angle. There is no pneumothorax. Faint opacity seen in the right apex may be an artifact such as skin fold. Tip of right IJ central venous catheter is seen at the junction of superior vena cava and right atrium. IMPRESSION: Extensive interstitial and alveolar densities are seen in both lungs, more so on the left side with no significant interval change. Findings suggest multifocal pneumonia. Part of this finding may be  due to pulmonary edema. Small left pleural effusion. Electronically Signed   By: Elmer Picker M.D.   On: 03/12/2022 08:00   DG CHEST PORT 1 VIEW  Result Date: 03/12/2022 CLINICAL DATA:  Acute respiratory failure with hypoxia EXAM: PORTABLE CHEST 1 VIEW COMPARISON:  03/21/2022 FINDINGS: Endotracheal tube tip 3 cm above the carina. Central line tip at the [facility] RA junction. Worsening of infiltrate in the perihilar lung bilaterally, worse on the left than the right. This is presumed represent pneumonia, though an element of edema could be present. IMPRESSION: Worsening of perihilar infiltrate, worse on the left than the right. This is presumed to represent pneumonia, though an element of edema could be present. Electronically Signed   By: Nelson Chimes M.D.   On: 03/12/2022 07:19   DG CHEST PORT 1 VIEW  Result Date: 03/17/2022 CLINICAL DATA:  Status post retraction of a right jugular catheter. EXAM: PORTABLE CHEST 1 VIEW COMPARISON:  Earlier today. FINDINGS: The right jugular catheter has been retracted with its tip at the superior cavoatrial junction. The endotracheal tube is unchanged. Bilateral linear densities, mildly increased on the left and mildly improved on the right. This includes a persistent curvy linear density at the right lung base, simulating pneumoperitoneum. Thoracic spine degenerative changes. IMPRESSION: 1. Right jugular catheter tip at the superior cavoatrial junction. 2. Bilateral atelectasis, mildly increased on the left and mildly improved on the right. Electronically Signed   By: Claudie Revering M.D.   On: 04/03/2022 16:23   DG Chest Port 1 View  Result Date: 03/20/2022 CLINICAL DATA:  Respiratory failure.  Intubation. EXAM: PORTABLE CHEST 1 VIEW COMPARISON:  03/17/2022 and prior studies FINDINGS: Endotracheal tube is now noted with tip 3.5 cm above the carina. RIGHT IJ central venous catheter with tip overlying the RIGHT atrium again noted. This is a low volume study with slightly  increasing bibasilar opacities/atelectasis. There is no evidence of pneumothorax. There may be trace bilateral pleural effusions present. No other significant changes noted. IMPRESSION: 1. Endotracheal tube with tip 3.5 cm above the carina. 2. RIGHT IJ central venous catheter with tip overlying the RIGHT atrium, consider 3-4 cm retraction. 3. Slightly increasing bibasilar opacities/atelectasis with possible trace bilateral pleural effusions. Electronically Signed   By: Margarette Canada M.D.   On: 03/13/2022 14:03   DG Chest Port 1 View  Result Date: 03/29/2022 CLINICAL DATA:  Central line placement. Shortness of breath. Syncope. EXAM: PORTABLE CHEST 1 VIEW COMPARISON:  03/17/2022 FINDINGS: A new right jugular central venous catheter seen with tip overlying the mid right atrium, approximately 4 cm below the superior cavoatrial junction. No evidence of pneumothorax. Low lung volumes are again seen with mild bibasilar atelectasis. Evidence of pulmonary consolidation or pleural effusion. Heart size is within normal limits. IMPRESSION: New right jugular central venous catheter tip overlies the mid right  atrium, approximately 4 cm below the superior cavoatrial junction. No evidence of pneumothorax. Low lung volumes and bibasilar atelectasis, without significant change. Electronically Signed   By: Marlaine Hind M.D.   On: 03/23/2022 10:25   DG Chest Portable 1 View  Result Date: 03/28/2022 CLINICAL DATA:  Shortness of breath. Syncope. EXAM: PORTABLE CHEST 1 VIEW COMPARISON:  02/21/2022 FINDINGS: Normal sized heart. Poor inspiration. Minimal linear atelectasis/scarring at both lung bases. Otherwise, clear lungs with normal vascularity. Thoracic spine degenerative changes. IMPRESSION: Poor inspiration with minimal bibasilar linear atelectasis/scarring. Electronically Signed   By: Claudie Revering M.D.   On: 03/09/2022 14:01   US Paracentesis  Result Date: 03/06/2022 INDICATION: Cirrhosis, ascites EXAM: ULTRASOUND GUIDED  DIAGNOSTIC AND THERAPEUTIC PARACENTESIS MEDICATIONS: None. COMPLICATIONS: None immediate. PROCEDURE: Informed written consent was obtained from the patient after a discussion of the risks, benefits and alternatives to treatment. A timeout was performed prior to the initiation of the procedure. Initial ultrasound scanning demonstrates a large amount of ascites within the right lower abdominal quadrant. The right lower abdomen was prepped and draped in the usual sterile fashion. 1% lidocaine was used for local anesthesia. Following this, a 19 gauge, 7-cm, Yueh catheter was introduced. An ultrasound image was saved for documentation purposes. The paracentesis was performed. The catheter was removed and a dressing was applied. The patient tolerated the procedure well without immediate post procedural complication. Patient received post-procedure intravenous albumin; see nursing notes for details. FINDINGS: A total of approximately 5 of yellow fluid was removed. Samples were sent to the laboratory as requested by the clinical team. IMPRESSION: Successful ultrasound-guided paracentesis yielding 5 liters of peritoneal fluid. Electronically Signed   By: Lavonia Dana M.D.   On: 03/06/2022 14:00   IR Paracentesis  Result Date: 02/27/2022 INDICATION: Patient with a history of cirrhosis and recurrent ascites. Interventional radiology asked to perform a diagnostic and therapeutic paracentesis. EXAM: ULTRASOUND GUIDED PARACENTESIS MEDICATIONS: 1% lidocaine 10 mL COMPLICATIONS: None immediate. PROCEDURE: Informed written consent was obtained from the patient after a discussion of the risks, benefits and alternatives to treatment. A timeout was performed prior to the initiation of the procedure. Initial ultrasound scanning demonstrates a large amount of ascites within the right lower abdominal quadrant. The right lower abdomen was prepped and draped in the usual sterile fashion. 1% lidocaine was used for local anesthesia.  Following this, a 19 gauge, 7-cm, Yueh catheter was introduced. An ultrasound image was saved for documentation purposes. The paracentesis was performed. The catheter was removed and a dressing was applied. The patient tolerated the procedure well without immediate post procedural complication. Patient received post-procedure intravenous albumin; see nursing notes for details. FINDINGS: A total of approximately 5.4 L of clear yellow fluid was removed. Samples were sent to the laboratory as requested by the clinical team. IMPRESSION: Successful ultrasound-guided paracentesis yielding 5.4 liters of peritoneal fluid. Read by: Soyla Dryer, NP PLAN: The patient has previously been evaluated by the [facility], and deemed not a candidate for intervention at this time due to persistently elevated Na-MELD Electronically Signed   By: Ruthann Cancer M.D.   On: 02/27/2022 14:36   DG Swallowing Func-Speech Pathology  Result Date: 02/27/2022 Table formatting from the original result was not included. Objective Swallowing Evaluation: Type of Study: MBS-Modified Barium Swallow Study  Patient Details Name: LUKE FALERO MRN: [id number] Date of Birth: 13-Dec-1950 Today's Date: 02/27/2022 Time: SLP Start Time (ACUTE ONLY): 1237 -SLP Stop Time (ACUTE ONLY): 7026 SLP Time Calculation (  min) (ACUTE ONLY): 18 min Past Medical History: Past Medical History: Diagnosis Date  Cataract   CKD (chronic kidney disease) stage 3, GFR 30-59 ml/min (HCC)   Coronary atherosclerosis of native coronary artery   BMS LAD 2006; residual 75% distal RCA; EF 50%  Essential hypertension   Glaucoma   Low back pain   MI (myocardial infarction) ([place])   Anterior 2006  Mixed hyperlipidemia   Renal insufficiency   Type 2 diabetes mellitus ([place])  Past Surgical History: Past Surgical History: Procedure Laterality Date  CATARACT EXTRACTION, BILATERAL    CORONARY ANGIOPLASTY WITH STENT PLACEMENT  2006  ESOPHAGEAL  BANDING  02/01/2022  Procedure: ESOPHAGEAL BANDING;  Surgeon: Eloise Harman, DO;  Location: [facility]O [facility];  Service: Endoscopy;;  ESOPHAGEAL BANDING  02/20/2022  Procedure: ESOPHAGEAL BANDING;  Surgeon: Ladene Artist, MD;  Location[facility] END[facility]COPY;  Service: Gastroenterology;;  ESOPHAGOGASTRODUODENOSCOPY (EGD) WITH PROPOFOL N/A 02/01/2022  Procedure: ESOPHAGOGASTRODUODENOSCOPY (EGD) WITH PROPOFOL;  Surgeon: Eloise Harman, DO;  Location: [facility];  Service: Endoscopy;  Laterality: N/A;  12:30 am  ESOPHAGOGASTRODUODENOSCOPY (EGD) WITH PROPOFOL N/A 02/20/2022  Procedure: ESOPHAGOGASTRODUODENOSCOPY (EGD) WITH PROPOFOL;  Surgeon: Ladene Artist, MD;  Location: [facility] ENDOSCOPY;  Service: Gastroenterology;  Laterality: N/A; HPI: THEODOR MUSTIN is a 71 y.o. male who presented to [place] 10/16 with cramping of the hands and feet.  He had had a paracentesis earlier that day with 4 L removed (see below).  While in ED, he began to spit up bright red blood.  This progressed to frank hematemesis, large-volume and persistent.  He was started on Protonix, octreotide, ceftriaxone.  He then became hypotensive; therefore, massive transfusion protocol was initiated.  He received 8 units PRBC, 2 units FFP, 1 unit platelets.  He was intubated for airway protection was also started on Levophed. Pt with EGD 10/18 with banding x6. CXR 10/18 without focal infiltrate. 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  He was apparently evaluated at [facility] for transplant but was deemed to not be a candidate.  No data recorded  Recommendations for follow up therapy are one component of a multi-disciplinary discharge planning process, led by the attending physician.  Recommendations may be updated based on patient status, additional functional  criteria and insurance authorization. Assessment / Plan / Recommendation   02/27/2022   1:11 PM Clinical Impressions Clinical Impression Pt presents with a mild oropharyngeal dysphagia c/b delayed swallow initiation, reduced base of tongue retraction, incomplete laryngeal closure, and diminished sensation.  These deficits resulted in penetration of thin liquid during the swallow.  Penetration was silent, but did fully clear with subsequent swallows.  There was moderate vallecula residue with all consistencies which was also reduced with subequent swallows.  With pill simulation, there was brief stasis of tablet in vallecula.  There was esophagea stasis with table which was not fully cleared by liquid wash, but did clear with puree bolus.  Recommend advancing pt to regular texture solids as medically appropriate.  Alternate liquids and solids.  SLP Visit Diagnosis Dysphagia, oropharyngeal phase (R13.12) Impact on safety and function Mild aspiration risk     02/27/2022   1:11 PM Treatment Recommendations Treatment Recommendations No treatment recommended at this time     02/27/2022   1:15 PM Prognosis Prognosis for Safe Diet Advancement --   02/27/2022   1:11 PM Diet  Recommendations SLP Diet Recommendations Thin liquid;Regular solids Liquid Administration via Straw Medication Administration Whole meds with liquid Compensations Slow rate;Small sips/bites;Follow solids with liquid     02/27/2022   1:11 PM Other Recommendations Oral Care Recommendations Oral care BID Follow Up Recommendations No SLP follow up Assistance recommended at discharge -- Functional Status Assessment Patient has not had a recent decline in their functional status   02/23/2022  12:29 PM Frequency and Duration  Speech Therapy Frequency (ACUTE ONLY) min 2x/week Treatment Duration 2 weeks     02/27/2022   1:06 PM Oral Phase Oral - Thin Straw Premature spillage Oral - Puree WFL Oral - Regular [facility]    02/27/2022   1:09 PM Pharyngeal Phase Pharyngeal-  Thin Straw Reduced tongue base retraction;Penetration/Aspiration during swallow Pharyngeal- Puree Pharyngeal residue - valleculae;Reduced tongue base retraction;Delayed swallow initiation-vallecula Pharyngeal- Regular Reduced tongue base retraction;Pharyngeal residue - valleculae Pharyngeal- Pill Reduced tongue base retraction;Pharyngeal residue - valleculae Pharyngeal Material does not enter airway    02/27/2022   1:08 PM Cervical Esophageal Phase  Cervical Esophageal Phase Impaired Celedonio Savage, MA, CCC-SLP Acute Rehabilitation Services Office: [phone] 02/27/2022, 1:20 PM                     DG Chest Port 1 View  Result Date: 02/21/2022 CLINICAL DATA:  Ventilator dependent respiratory failure. EXAM: PORTABLE CHEST 1 VIEW COMPARISON:  Portable chest yesterday at 10:23 p.m. FINDINGS: 4:59 a.m. ETT tip is 3.1 cm from the carina, left IJ central line tip in the distal SVC. There are multiple overlying monitor wires. There are low lung volumes. Mild elevation right hemidiaphragm limiting visualization of the right base. There is perihilar and right basilar linear atelectasis without focal consolidation the visualized lungs. The cardiomediastinal silhouette and vasculature are normal. There is thoracic spondylosis. IMPRESSION: Limited exam with low lung volumes and elevated right hemidiaphragm. Assessment of the bases is limited. The visualized lungs without focal infiltrates. Support apparatus as above. Electronically Signed   By: Telford Nab M.D.   On: 02/21/2022 06:18   DG CHEST PORT 1 VIEW  Result Date: 02/20/2022 CLINICAL DATA:  Central line placement EXAM: PORTABLE CHEST 1 VIEW COMPARISON:  02/20/2022 FINDINGS: Left central line is been placed with the tip in the SVC. No pneumothorax. Endotracheal tube is 5 cm above the carina. Heart normal size. No confluent opacities or effusions. IMPRESSION: Support devices in expected position as above. No acute cardiopulmonary disease. Electronically Signed    By: Rolm Baptise M.D.   On: 02/20/2022 22:30   DG Chest Port 1 View  Result Date: 02/20/2022 CLINICAL DATA:  Intubation EXAM: PORTABLE CHEST 1 VIEW COMPARISON:  02/13/2022 FINDINGS: Endotracheal tube with tip just below the clavicular heads. The enteric tube at least reaches the stomach. Artifact from EKG leads. Low volume chest with streaky density at the bases suggesting atelectasis. No effusion or pneumothorax. Skin fold at the left apex. IMPRESSION: 1. Unremarkable hardware positioning. 2. Low volume chest with mild atelectasis. Electronically Signed   By: Jorje Guild M.D.   On: 02/20/2022 04:13   US Paracentesis  Result Date: 02/19/2022 INDICATION: Ascites secondary to cirrhosis EXAM: ULTRASOUND GUIDED  PARACENTESIS MEDICATIONS: None. COMPLICATIONS: None immediate. PROCEDURE: Informed written consent was obtained from the patient after a discussion of the risks, benefits and alternatives to treatment. A timeout was performed prior to the initiation of the procedure. Initial ultrasound scanning demonstrates a large amount of ascites within the right lower abdominal quadrant. The right lower  abdomen was prepped and draped in the usual sterile fashion. 1% lidocaine was used for local anesthesia. Following this, a 19 gauge, 7-cm, Yueh catheter was introduced. An ultrasound image was saved for documentation purposes. The paracentesis was performed. The catheter was removed and a dressing was applied. The patient tolerated the procedure well without immediate post procedural complication. Patient received post-procedure intravenous albumin; see nursing notes for details. FINDINGS: A total of approximately 4.0 L of serous fluid was removed. Samples were sent to the laboratory as requested by the clinical team. IMPRESSION: Successful ultrasound-guided paracentesis yielding 4.0 L of peritoneal fluid. Electronically Signed   By: Abigail Miyamoto M.D.   On: 02/19/2022 12:13   US Paracentesis  Result Date:  02/14/2022 INDICATION: Cirrhosis with recurrent ascites EXAM: ULTRASOUND GUIDED RLQ PARACENTESIS MEDICATIONS: None. COMPLICATIONS: None immediate. PROCEDURE: Informed written consent was obtained from the patient after a discussion of the risks, benefits and alternatives to treatment. A timeout was performed prior to the initiation of the procedure. Initial ultrasound scanning demonstrates a large amount of ascites within the right lower abdominal quadrant. The right lower abdomen was prepped and draped in the usual sterile fashion. 1% lidocaine was used for local anesthesia. Following this, a 19 gauge, 7-cm, Yueh catheter was introduced. An ultrasound image was saved for documentation purposes. The paracentesis was performed. The catheter was removed and a dressing was applied. The patient tolerated the procedure well without immediate post procedural complication. Patient received post-procedure intravenous albumin; see nursing notes for details. FINDINGS: A total of approximately 3.4L of peritoneal fluid was removed. Samples were sent to the laboratory as requested by the clinical team. IMPRESSION: Successful ultrasound-guided paracentesis yielding 3.4 liters of peritoneal fluid. PLAN: The patient has required >/=2 paracenteses in a 30 day period and a formal evaluation by the [facility] has been arranged. Electronically Signed   By: Lavonia Dana M.D.   On: 02/14/2022 10:56   DG Abdomen Acute W/Chest  Result Date: 02/13/2022 CLINICAL DATA:  Short of breath, abdominal pain. History of cirrhosis EXAM: DG ABDOMEN ACUTE WITH 1 VIEW CHEST COMPARISON:  Chest and abdomen 02/04/2022 FINDINGS: Heart size and vascularity normal. Mild bibasilar atelectasis. No pleural effusion Normal bowel gas pattern. No bowel obstruction or free air. Negative for urinary tract calculi. IMPRESSION: 1. Mild bibasilar atelectasis. 2. Normal bowel gas pattern. Electronically Signed    By: Franchot Gallo M.D.   On: 02/13/2022 16:21    Assessment  71 y.o. male with a history of decompensated liver cirrhosis (unclear if related to possible Wilson's disease) complicated by recurrent variceal bleeding, ascites, encephalopathy, and thrombocytopenia.  Also with history of coronary artery disease, MI, HTN, HLD, and diabetes who presented to the hospital for evaluation of presenting acute hematemesis on 04/01/2022.  He presented with significant drop in his hemoglobin and severe hypotension.  GI consulted for further evaluation and management.  Decompensated Liver cirrhosis: Etiology unclear at this time however possibly related to Wilson's disease.  Previously with elevated IgA but without any chronic alcohol use and negative for hepatitis.  Other AIH markers negative.  He has been evaluated by [facility] transplant center and determined to not be a candidate.  [facility] referral in process however may be difficult given transportation issues.  Course has been complicated by recurrent variceal bleeding, encephalopathy, and ascites requiring frequent paracentesis.  Presenting this hospitalization for recurrent variceal bleeding given hematemesis and melena as stated below. MELD 3.0: 33. Last paracentesis 03/06/22 with removal of 5L  ascites. Mild juandice today and abdomen distended but fairly soft. Likely may need paracentesis today vs tomorrow.   Hematemesis, Melena: Large-volume hematemesis with hypotension on presentation.  He underwent EGD 03/09/2022 with four columns of grade 3 varices s/p clips x3 and evidence of 5 healing ulcers from prior bandings. Type 3 gastroesophageal varices extending into the fundus were present without bleeding. Mild portal hypertensive gastropathy and normal duodenum. Query some aspiration and small pink tinged secretions the result of self extubation this morning.  Patient self extubated this morning with small amount of pink-tinged secretions noted without any significant  hematemesis.  This was likely secondary to trauma from removal.  Nurse reported small bit of melanotic stool, some is which to be expected given large volume bleeding prior.  Plan / Recommendations  Continue pantoprazole and octreotide infusion for 72 hours PPI IV BID after infusion Keep active T/S, every 3 days Continue to monitor H/H, transfuse for Hgb <7, do not transfuse if Hgb >9.  Continue to monitor for overt GI bleeding Continue Rocephin for total of 5 days. Transition lactulose to enema while NPO.  Continue zofran as needed Avoids NSAIDs Keep Npo today given tenuous respiratory status and self extubation. TIPs likely not viable option given current state  Recommend supportive care/palliative measures.  Addendum: Hold lactulose enema for now given hematochezia. Patient with respiratory decompensation requiring intubation. He is to receive 2u PRBC and FFP given reports of melena followed by hematochezia after attempting lactulose enema. Not stable for any endoscopic intervention and ultimately goals of care conversation needed. Likely not an IR candidate.     LOS: 2 days    03/12/2022, 10:03 AM   Venetia Night, MSN, FNP-BC, AGACNP-BC [facility], [facility], [facility]

## 2022-03-12 NOTE — Inpatient Diabetes Management (Signed)
Inpatient Diabetes Program Recommendations  AACE/ADA: New Consensus Statement on Inpatient Glycemic Control   Target Ranges:  Prepandial:   less than 140 mg/dL      Peak postprandial:   less than 180 mg/dL (1-2 hours)      Critically ill patients:  140 - 180 mg/dL    Latest Reference Range & Units 03/30/2022 10:38 03/16/2022 11:59 03/27/2022 16:34 03/30/2022 19:48 03/12/22 00:09 03/12/22 03:28  Glucose-Capillary 70 - 99 mg/dL 202 (H) 216 (H) 273 (H) 263 (H) 250 (H) 252 (H)   Review of Glycemic Control  Diabetes history: DM2 Outpatient Diabetes medications:Tradjenta 5 mg daily Current orders for Inpatient glycemic control: Novolog 0-6 units Q4H  Inpatient Diabetes Program Recommendations:    Insulin: Please consider ordering Semglee 7 units Q24H.  NOTE: Patient received Decadron 8 mg on 03/30/2022 which is contributing to hyperglycemia.  Thanks, Barnie Alderman, RN, MSN, [facility] Coordinator Inpatient Diabetes Program [phone] (Team Pager from 8am to [place])

## 2022-03-12 NOTE — Progress Notes (Signed)
Attempted to give patient Lactulose enema when patient just started pouring liquid, black stool. Pt then became in respiratory distress when attempting to give the enema despite 15L HFNC and 15L NRB on the patient. Once O2 saturation finally stabilized and read patient's O2 sat's were 56% and holding. Code blue was then called for patient being in respiratory distress.   ED doc and RT intubated patient successfully. Dr. Carles Collet and multiple staff members were also at bedside. Pt had lots of blood loss during intubation. Dr. Carles Collet ordered multiple blood products. Being given currently, will continue to monitor.  Family also updated via Dr. Carles Collet.

## 2022-03-12 NOTE — Progress Notes (Addendum)
PROGRESS NOTE  Edward [name] YQI:[id number] DOB: 08-May-1950 DOA: 04/05/2022 PCP: [name], FNP  Brief History:  71 year old male with a history of diabetes mellitus type 2, hypertension, CKD stage III, coronary artery disease, cryptogenic liver cirrhosis with esophageal varices, coronary disease, hyperlipidemia presenting with an episode of unresponsiveness and spitting up blood.  Notably, the patient was recently hospitalized at [facility] from 02/20/2022 to 03/01/2022 due to hemorrhagic shock from bleeding esophageal varices.  The patient became hypotensive and obtunded.  He was intubated at that time for airway protection.  The patient went underwent EGD on 02/20/2022 which revealed grade 3 esophageal varices with active bleeding.  The varices were banded x6.  He was transfused 8 units PRBC and 2 units FFP.  Palliative medicine was consulted during the admission.  And full scope of care was continued.  The patient was discharged home with hemoglobin 9.5 and serum creatinine 2.26. In the ED, the patient was tachycardic with soft blood pressures with SBP in the 90s and low 100s.  WBC 6.0, hemoglobin 10.1, platelets 61,000.  Sodium 132, potassium 5.5, bicarbonate 19, serum creatinine 3.52.  EKG shows a sinus rhythm without any ST-T wave changes.  UA showed 21-50 WBC.  he went to to the hepatology service at [facility] 12/02/2021--?  Hepatorenal syndrome etc. there was question of Wilson's disease given abnormal copper testing--- he had negative ANA mitochondrial antibodies anti-smooth muscle IgG IgA IgM and negative viral hepatitis panels but has never had a liver biopsy Because of improved MELD score and lack of transportation they declined putting him on the transplant service and recommended he follow closely with hepatology at [facility] hepatology and family was given information about the same  On the evening of 03/30/2022, the patient developed hematemesis.  He became  hypotensive. On the morning of 03/16/2022, 2 units emergent PRBC and FFP were ordered.  GI was consulted to assist with management.  Patient was placed on Levophed. He underwent EGD 03/29/2022 which showed Four columns of grade III varices with stigmata of recent bleeding were found in the middle third of the esophagus and in the lower third of the esophagus.  Three bands were successfully placed with complete eradication, resulting in deflation of varices.He was left intubated after the procedure.  However, patient self-extubated on 03/12/22.   Assessment/Plan: Hemorrhagic shock/acute blood loss anemia -Secondary to GI bleed, likely variceal bleed versus bleeding from portal hypertensive gastropathy -2 units PRBC transfused emergently -2 units FFP ordered for transfusion -1 unit platelets ordered -GI consulted>>EGD 11/5 -11/5 EGD>>Four columns of grade III varices with stigmata of recent bleeding.  Banding x 3 -wean Levophed off -Serial H&H -Continue ceftriaxone for SBP prophylaxis>>broaded to zosyn for aspiration -Continue octreotide -continue pantoprazole drip -Remain n.p.o. for now -remain on midodrine   Acute on chronic renal failure--CKD stage IIIb -Secondary to hypotension and volume depletion -Holding furosemide and spironolactone temporarily -Continue IV albumin transfusion -03/20/2022 serum creatinine peaking 3.99 -Baseline creatinine 2.2-2.4   Decompensated liver cirrhosis -Paracentesis if able -03/27/2022 MELD 30 -11/14/2021 hemoglobin A1c 6.7 -NovoLog sliding scale -he has been declined at [facility] hepatology for above reasons-he will need to follow-up with [facility]   Acute respiratory Failure with hypoxia -due to aspiration pneumonia and third spacing fluid -CVP 7-8 -start zosyn -11/6--self-extubated>>stable on NRB -personally reviewed CXR 11.6>>L>R opacities -wean off precedex   Episode of Unresponsivness -suspect due to vagal phenomena and hemodynamic changes -Echo   -currently A&O  x 3   Coronary Artery Disease -no chest pain presently -personally reviewed EKG--sinus, no STT changes   Diabetes Mellitus type 2 11/14/21 A1C--6.7 -novolog sliding scale     Family Communication:   spouse updated 11/6   Consultants:  GI, palliative   Code Status:  FULL--confirmed with patient   DVT Prophylaxis:  SCDs     Procedures: As Listed in Progress Note Above   Antibiotics: Ceftriaxone 11/4>>11/5 Zosyn 11/6>>      The patient is critically ill with multiple organ systems failure and requires high complexity decision making for assessment and support, frequent evaluation and titration of therapies, application of advanced monitoring technologies and extensive interpretation of multiple databases.  Critical care time - 45 mins.       Subjective: Patient denies fevers, chills, headache, chest pain, dyspnea, nausea, vomiting, diarrhea, abdominal pain, dysuria, hematuria, hematochezia, and melena.  Still spitting up small amounts of blood   Objective: Vitals:   03/12/22 1000 03/12/22 1015 03/12/22 1030 03/12/22 1045  BP: 122/66 (!) 100/57 (!) 99/48 (!) 94/51  Pulse: 88 80 70 79  Resp: (!) 22 (!) '31 17 19  '$ Temp:      TempSrc:      SpO2: 92% 94% 94% 90%  Weight:      Height:        Intake/Output Summary (Last 24 hours) at 03/12/2022 1058 Last data filed at 03/12/2022 1039 Gross per 24 hour  Intake 2562.1 ml  Output 1200 ml  Net 1362.1 ml   Weight change: 4.228 kg Exam:  General:  Pt is alert, follows commands appropriately, not in acute distress HEENT: No icterus, No thrush, No neck mass, /AT Cardiovascular: RRR, S1/S2, no rubs, no gallops Respiratory: bilateral rales. No wheeze Abdomen: Soft/+BS, non tender, + distended, no guarding Extremities: 1 + LE edema, No lymphangitis, No petechiae, No rashes, no synovitis   Data Reviewed: I have personally reviewed following labs and imaging studies Basic Metabolic Panel: Recent Labs   Lab 03/25/2022 1427 04/01/2022 0756 03/08/2022 1400 03/12/22 0140  NA 132* 134*  --  134*  K 5.5* 5.3* 4.9 4.9  CL 98 100  --  101  CO2 19* 16*  --  22  GLUCOSE 239* 226*  --  258*  BUN 46* 64*  --  69*  CREATININE 3.52* 3.99*  --  3.55*  CALCIUM 8.6* 8.6*  --  8.6*   Liver Function Tests: Recent Labs  Lab 03/24/2022 1427 03/09/2022 0756 03/12/22 0140  AST 71* 42* 32  ALT <5 32 26  ALKPHOS 78 42 45  BILITOT 3.5* 3.5* 9.1*  PROT 5.9* 5.4* 5.8*  ALBUMIN 3.1* 3.8 4.1   No results for input(s): "LIPASE", "AMYLASE" in the last 168 hours. Recent Labs  Lab 04/05/2022 1427 03/24/2022 1417 03/12/22 0140  AMMONIA 34 60* 41*   Coagulation Profile: Recent Labs  Lab 03/21/2022 1427 04/04/2022 0756 03/12/22 0140  INR 1.5* 2.1* 1.6*   CBC: Recent Labs  Lab 03/18/2022 1427 04/05/2022 0756 04/01/2022 1400 03/28/2022 1959 03/12/22 0140 03/12/22 0742  WBC 6.0 6.2  --   --  4.6  --   HGB 10.1* 6.1* 9.0* 8.3* 8.7* 8.6*  HCT 30.4* 18.8* 25.4* 23.9* 25.2* 25.4*  MCV 97.7 100.5*  --   --  91.3  --   PLT 61* 56*  --   --  46*  --    Cardiac Enzymes: No results for input(s): "CKTOTAL", "CKMB", "CKMBINDEX", "TROPONINI" in the last 168 hours. BNP: Invalid  input(s): "POCBNP" CBG: Recent Labs  Lab 03/11/2022 1634 03/17/2022 1948 03/12/22 0009 03/12/22 0328 03/12/22 0744  GLUCAP 273* 263* 250* 252* 230*   HbA1C: Recent Labs    03/20/2022 1400  HGBA1C 5.3   Urine analysis:    Component Value Date/Time   COLORURINE YELLOW 03/18/2022 1550   APPEARANCEUR CLEAR 04/05/2022 1550   LABSPEC 1.011 03/23/2022 1550   PHURINE 5.0 03/30/2022 1550   GLUCOSEU 150 (A) 04/02/2022 1550   HGBUR NEGATIVE 03/27/2022 1550   BILIRUBINUR NEGATIVE 04/01/2022 1550   KETONESUR NEGATIVE 04/05/2022 1550   PROTEINUR NEGATIVE 03/24/2022 1550   NITRITE NEGATIVE 03/14/2022 1550   LEUKOCYTESUR SMALL (A) 03/26/2022 1550   Sepsis Labs: '@LABRCNTIP'$ (procalcitonin:4,lacticidven:4) ) Recent Results (from the past 240  hour(s))  Culture, body fluid w Gram Stain-bottle     Status: None   Collection Time: 03/06/22  1:20 PM   Specimen: Peritoneal Washings  Result Value Ref Range Status   Specimen Description PERITONEAL  Final   Special Requests BAA 10CC  Final   Culture   Final    NO GROWTH 5 DAYS Performed at [facility], [address], [place], [place] [address]    Report Status 03/26/2022 FINAL  Final  Gram stain     Status: None   Collection Time: 03/06/22  1:20 PM   Specimen: Peritoneal Washings  Result Value Ref Range Status   Specimen Description PERITONEAL  Final   Special Requests NONE  Final   Gram Stain   Final    NO ORGANISMS SEEN WBC PRESENT,BOTH PMN AND MONONUCLEAR CYTOSPIN SMEAR Performed at [facility], [address], [place], [place] [address]    Report Status 03/06/2022 FINAL  Final  MRSA Next Gen by PCR, Nasal     Status: None   Collection Time: 03/26/2022 12:29 PM   Specimen: Nasal Mucosa; Nasal Swab  Result Value Ref Range Status   MRSA by PCR Next Gen NOT DETECTED NOT DETECTED Final    Comment: (NOTE) The GeneXpert MRSA Assay (FDA approved for NASAL specimens only), is one component of a comprehensive MRSA colonization surveillance program. It is not intended to diagnose MRSA infection nor to guide or monitor treatment for MRSA infections. Test performance is not FDA approved in patients less than 108 years old. Performed at [facility], [address], [place], [place] [address]      Scheduled Meds:  sodium chloride   Intravenous Once   brimonidine  1 drop Both Eyes BID   Chlorhexidine Gluconate Cloth  6 each Topical Daily   docusate  100 mg Per Tube BID   insulin aspart  0-6 Units Subcutaneous Q4H   lactulose  300 mL Rectal BID   midodrine  20 mg Oral TID WC   mouth rinse  15 mL Mouth Rinse Q4H   pantoprazole (PROTONIX) IV  40 mg Intravenous Once   polyethylene glycol  17 g Per Tube Daily   Continuous Infusions:  sodium chloride     albumin human Stopped  (03/12/22 0951)   dexmedetomidine (PRECEDEX) IV infusion 0.4 mcg/kg/hr (03/12/22 1039)   norepinephrine (LEVOPHED) Adult infusion 1 mcg/min (03/12/22 1009)   octreotide (SANDOSTATIN) 500 mcg in sodium chloride 0.9 % 250 mL (2 mcg/mL) infusion 50 mcg/hr (03/12/22 1009)   pantoprazole 8 mg/hr (03/12/22 1009)   piperacillin-tazobactam      Procedures/Studies: DG CHEST PORT 1 VIEW  Result Date: 03/12/2022 CLINICAL DATA:  Respiratory distress EXAM: PORTABLE CHEST 1 VIEW COMPARISON:  Previous studies including the examination done  earlier today FINDINGS: Transverse diameter of heart is in the upper limits of normal. Diffuse alveolar densities are seen in left parahilar region and left lower lung field. Increased interstitial markings are seen in right parahilar region. No significant interval changes are noted in comparison with the immediate previous study. Right lateral CP angle is clear. There is minimal blunting of left lateral CP angle. There is no pneumothorax. Faint opacity seen in the right apex may be an artifact such as skin fold. Tip of right IJ central venous catheter is seen at the junction of superior vena cava and right atrium. IMPRESSION: Extensive interstitial and alveolar densities are seen in both lungs, more so on the left side with no significant interval change. Findings suggest multifocal pneumonia. Part of this finding may be due to pulmonary edema. Small left pleural effusion. Electronically Signed   By: Elmer Picker M.D.   On: 03/12/2022 08:00   DG CHEST PORT 1 VIEW  Result Date: 03/12/2022 CLINICAL DATA:  Acute respiratory failure with hypoxia EXAM: PORTABLE CHEST 1 VIEW COMPARISON:  03/26/2022 FINDINGS: Endotracheal tube tip 3 cm above the carina. Central line tip at the [facility] RA junction. Worsening of infiltrate in the perihilar lung bilaterally, worse on the left than the right. This is presumed represent pneumonia, though an element of edema could be present. IMPRESSION:  Worsening of perihilar infiltrate, worse on the left than the right. This is presumed to represent pneumonia, though an element of edema could be present. Electronically Signed   By: [place] Chimes M.D.   On: 03/12/2022 07:19   DG CHEST PORT 1 VIEW  Result Date: 03/19/2022 CLINICAL DATA:  Status post retraction of a right jugular catheter. EXAM: PORTABLE CHEST 1 VIEW COMPARISON:  Earlier today. FINDINGS: The right jugular catheter has been retracted with its tip at the superior cavoatrial junction. The endotracheal tube is unchanged. Bilateral linear densities, mildly increased on the left and mildly improved on the right. This includes a persistent curvy linear density at the right lung base, simulating pneumoperitoneum. Thoracic spine degenerative changes. IMPRESSION: 1. Right jugular catheter tip at the superior cavoatrial junction. 2. Bilateral atelectasis, mildly increased on the left and mildly improved on the right. Electronically Signed   By: Claudie Revering M.D.   On: 03/30/2022 16:23   DG Chest Port 1 View  Result Date: 03/25/2022 CLINICAL DATA:  Respiratory failure.  Intubation. EXAM: PORTABLE CHEST 1 VIEW COMPARISON:  03/16/2022 and prior studies FINDINGS: Endotracheal tube is now noted with tip 3.5 cm above the carina. RIGHT IJ central venous catheter with tip overlying the RIGHT atrium again noted. This is a low volume study with slightly increasing bibasilar opacities/atelectasis. There is no evidence of pneumothorax. There may be trace bilateral pleural effusions present. No other significant changes noted. IMPRESSION: 1. Endotracheal tube with tip 3.5 cm above the carina. 2. RIGHT IJ central venous catheter with tip overlying the RIGHT atrium, consider 3-4 cm retraction. 3. Slightly increasing bibasilar opacities/atelectasis with possible trace bilateral pleural effusions. Electronically Signed   By: Margarette Canada M.D.   On: 03/09/2022 14:03   DG Chest Port 1 View  Result Date:  04/01/2022 CLINICAL DATA:  Central line placement. Shortness of breath. Syncope. EXAM: PORTABLE CHEST 1 VIEW COMPARISON:  03/20/2022 FINDINGS: A new right jugular central venous catheter seen with tip overlying the mid right atrium, approximately 4 cm below the superior cavoatrial junction. No evidence of pneumothorax. Low lung volumes are again seen with mild bibasilar atelectasis. Evidence of  pulmonary consolidation or pleural effusion. Heart size is within normal limits. IMPRESSION: New right jugular central venous catheter tip overlies the mid right atrium, approximately 4 cm below the superior cavoatrial junction. No evidence of pneumothorax. Low lung volumes and bibasilar atelectasis, without significant change. Electronically Signed   By: Marlaine Hind M.D.   On: 03/22/2022 10:25   DG Chest Portable 1 View  Result Date: 03/17/2022 CLINICAL DATA:  Shortness of breath. Syncope. EXAM: PORTABLE CHEST 1 VIEW COMPARISON:  02/21/2022 FINDINGS: Normal sized heart. Poor inspiration. Minimal linear atelectasis/scarring at both lung bases. Otherwise, clear lungs with normal vascularity. Thoracic spine degenerative changes. IMPRESSION: Poor inspiration with minimal bibasilar linear atelectasis/scarring. Electronically Signed   By: Claudie Revering M.D.   On: 03/30/2022 14:01   US Paracentesis  Result Date: 03/06/2022 INDICATION: Cirrhosis, ascites EXAM: ULTRASOUND GUIDED DIAGNOSTIC AND THERAPEUTIC PARACENTESIS MEDICATIONS: None. COMPLICATIONS: None immediate. PROCEDURE: Informed written consent was obtained from the patient after a discussion of the risks, benefits and alternatives to treatment. A timeout was performed prior to the initiation of the procedure. Initial ultrasound scanning demonstrates a large amount of ascites within the right lower abdominal quadrant. The right lower abdomen was prepped and draped in the usual sterile fashion. 1% lidocaine was used for local anesthesia. Following this, a 19 gauge,  7-cm, Yueh catheter was introduced. An ultrasound image was saved for documentation purposes. The paracentesis was performed. The catheter was removed and a dressing was applied. The patient tolerated the procedure well without immediate post procedural complication. Patient received post-procedure intravenous albumin; see nursing notes for details. FINDINGS: A total of approximately 5 of yellow fluid was removed. Samples were sent to the laboratory as requested by the clinical team. IMPRESSION: Successful ultrasound-guided paracentesis yielding 5 liters of peritoneal fluid. Electronically Signed   By: Lavonia Dana M.D.   On: 03/06/2022 14:00   IR Paracentesis  Result Date: 02/27/2022 INDICATION: Patient with a history of cirrhosis and recurrent ascites. Interventional radiology asked to perform a diagnostic and therapeutic paracentesis. EXAM: ULTRASOUND GUIDED PARACENTESIS MEDICATIONS: 1% lidocaine 10 mL COMPLICATIONS: None immediate. PROCEDURE: Informed written consent was obtained from the patient after a discussion of the risks, benefits and alternatives to treatment. A timeout was performed prior to the initiation of the procedure. Initial ultrasound scanning demonstrates a large amount of ascites within the right lower abdominal quadrant. The right lower abdomen was prepped and draped in the usual sterile fashion. 1% lidocaine was used for local anesthesia. Following this, a 19 gauge, 7-cm, Yueh catheter was introduced. An ultrasound image was saved for documentation purposes. The paracentesis was performed. The catheter was removed and a dressing was applied. The patient tolerated the procedure well without immediate post procedural complication. Patient received post-procedure intravenous albumin; see nursing notes for details. FINDINGS: A total of approximately 5.4 L of clear yellow fluid was removed. Samples were sent to the laboratory as requested by the clinical team. IMPRESSION: Successful  ultrasound-guided paracentesis yielding 5.4 liters of peritoneal fluid. Read by: Soyla Dryer, NP PLAN: The patient has previously been evaluated by the [facility] Interventional Radiology Portal Hypertension [facility], and deemed not a candidate for intervention at this time due to persistently elevated Na-MELD Electronically Signed   By: Ruthann Cancer M.D.   On: 02/27/2022 14:36   DG Swallowing Func-Speech Pathology  Result Date: 02/27/2022 Table formatting from the original result was not included. Objective Swallowing Evaluation: Type of Study: MBS-Modified Barium Swallow Study  Patient Details Name: TAYARI YANKEE MRN: [id number]  Date of Birth: 1950/10/11 Today's Date: 02/27/2022 Time: SLP Start Time (ACUTE ONLY): 1237 -SLP Stop Time (ACUTE ONLY): 1255 SLP Time Calculation (min) (ACUTE ONLY): 18 min Past Medical History: Past Medical History: Diagnosis Date  Cataract   CKD (chronic kidney disease) stage 3, GFR 30-59 ml/min (HCC)   Coronary atherosclerosis of native coronary artery   BMS LAD 2006; residual 75% distal RCA; EF 50%  Essential hypertension   Glaucoma   Low back pain   MI (myocardial infarction) ([place])   Anterior 2006  Mixed hyperlipidemia   Renal insufficiency   Type 2 diabetes mellitus ([place])  Past Surgical History: Past Surgical History: Procedure Laterality Date  CATARACT EXTRACTION, BILATERAL    CORONARY ANGIOPLASTY WITH STENT PLACEMENT  2006  ESOPHAGEAL BANDING  02/01/2022  Procedure: ESOPHAGEAL BANDING;  Surgeon: Eloise Harman, DO;  [facility];  Service: Endoscopy;;  ESOPHAGEAL BANDING  02/20/2022  Procedure: ESOPHAGEAL BANDING;  Surgeon: Ladene Artist, MD;  Location[facility] ENDOSCOPY;  Service: Gastroenterology;;  ESOPHAGOGASTRODUODENOSCOPY (EGD) WITH PROPOFOL N/A 02/01/2022  Procedure: ESOPHAGOGASTRODUODENOSCOPY (EGD) WITH PROPOFOL;  Surgeon: Eloise Harman, DO;  Location: [facility] ENDO SUITE;  Service: Endoscopy;  Laterality: N/A;  12:30 am  ESOPHAGOGASTRODUODENOSCOPY (EGD) WITH  PROPOFOL N/A 02/20/2022  Procedure: ESOPHAGOGASTRODUODENOSCOPY (EGD) WITH PROPOFOL;  Surgeon: Ladene Artist, MD;  Location: [facility] ENDOSCOPY;  Service: Gastroenterology;  Laterality: N/A; HPI: EURAL HOLZSCHUH is a 71 y.o. male who presented to [facility] 10/16 with cramping of the hands and feet.  He had had a paracentesis earlier that day with 4 L removed (see below).  While in ED, he began to spit up bright red blood.  This progressed to frank hematemesis, large-volume and persistent.  He was started on Protonix, octreotide, ceftriaxone.  He then became hypotensive; therefore, massive transfusion protocol was initiated.  He received 8 units PRBC, 2 units FFP, 1 unit platelets.  He was intubated for airway protection was also started on Levophed. Pt with EGD 10/18 with banding x6. CXR 10/18 without focal infiltrate. 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  He was apparently evaluated at [facility] for transplant but was deemed to not be a candidate.  No data recorded  Recommendations for follow up therapy are one component of a multi-disciplinary discharge planning process, led by the attending physician.  Recommendations may be updated based on patient status, additional functional criteria and insurance authorization. Assessment / Plan / Recommendation   02/27/2022   1:11 PM Clinical Impressions Clinical Impression Pt presents with a mild oropharyngeal dysphagia c/b delayed swallow initiation, reduced base of tongue retraction, incomplete laryngeal closure, and diminished sensation.  These deficits resulted in penetration of thin liquid during the swallow.  Penetration was silent, but did fully clear with subsequent swallows.  There was moderate vallecula residue with all consistencies which was also reduced with subequent  swallows.  With pill simulation, there was brief stasis of tablet in vallecula.  There was esophagea stasis with table which was not fully cleared by liquid wash, but did clear with puree bolus.  Recommend advancing pt to regular texture solids as medically appropriate.  Alternate liquids and solids.  SLP Visit Diagnosis Dysphagia, oropharyngeal phase (R13.12) Impact on safety and function Mild aspiration risk     02/27/2022   1:11 PM Treatment Recommendations Treatment Recommendations No treatment recommended at this time  02/27/2022   1:15 PM Prognosis Prognosis for Safe Diet Advancement --   02/27/2022   1:11 PM Diet Recommendations SLP Diet Recommendations Thin liquid;Regular solids Liquid Administration via Straw Medication Administration Whole meds with liquid Compensations Slow rate;Small sips/bites;Follow solids with liquid     02/27/2022   1:11 PM Other Recommendations Oral Care Recommendations Oral care BID Follow Up Recommendations No SLP follow up Assistance recommended at discharge -- Functional Status Assessment Patient has not had a recent decline in their functional status   02/23/2022  12:29 PM Frequency and Duration  Speech Therapy Frequency (ACUTE ONLY) min 2x/week Treatment Duration 2 weeks     02/27/2022   1:06 PM Oral Phase Oral - Thin Straw Premature spillage Oral - Puree WFL Oral - Regular [facility]    02/27/2022   1:09 PM Pharyngeal Phase Pharyngeal- Thin Straw Reduced tongue base retraction;Penetration/Aspiration during swallow Pharyngeal- Puree Pharyngeal residue - valleculae;Reduced tongue base retraction;Delayed swallow initiation-vallecula Pharyngeal- Regular Reduced tongue base retraction;Pharyngeal residue - valleculae Pharyngeal- Pill Reduced tongue base retraction;Pharyngeal residue - valleculae Pharyngeal Material does not enter airway    02/27/2022   1:08 PM Cervical Esophageal Phase  Cervical Esophageal Phase Impaired Celedonio Savage, MA, CCC-SLP Acute Rehabilitation Services Office:  [phone] 02/27/2022, 1:20 PM                     DG Chest Port 1 View  Result Date: 02/21/2022 CLINICAL DATA:  Ventilator dependent respiratory failure. EXAM: PORTABLE CHEST 1 VIEW COMPARISON:  Portable chest yesterday at 10:23 p.m. FINDINGS: 4:59 a.m. ETT tip is 3.1 cm from the carina, left IJ central line tip in the distal SVC. There are multiple overlying monitor wires. There are low lung volumes. Mild elevation right hemidiaphragm limiting visualization of the right base. There is perihilar and right basilar linear atelectasis without focal consolidation the visualized lungs. The cardiomediastinal silhouette and vasculature are normal. There is thoracic spondylosis. IMPRESSION: Limited exam with low lung volumes and elevated right hemidiaphragm. Assessment of the bases is limited. The visualized lungs without focal infiltrates. Support apparatus as above. Electronically Signed   By: Telford Nab M.D.   On: 02/21/2022 06:18   DG CHEST PORT 1 VIEW  Result Date: 02/20/2022 CLINICAL DATA:  Central line placement EXAM: PORTABLE CHEST 1 VIEW COMPARISON:  02/20/2022 FINDINGS: Left central line is been placed with the tip in the SVC. No pneumothorax. Endotracheal tube is 5 cm above the carina. Heart normal size. No confluent opacities or effusions. IMPRESSION: Support devices in expected position as above. No acute cardiopulmonary disease. Electronically Signed   By: Rolm Baptise M.D.   On: 02/20/2022 22:30   DG Chest Port 1 View  Result Date: 02/20/2022 CLINICAL DATA:  Intubation EXAM: PORTABLE CHEST 1 VIEW COMPARISON:  02/13/2022 FINDINGS: Endotracheal tube with tip just below the clavicular heads. The enteric tube at least reaches the stomach. Artifact from EKG leads. Low volume chest with streaky density at the bases suggesting atelectasis. No effusion or pneumothorax. Skin fold at the left apex. IMPRESSION: 1. Unremarkable hardware positioning. 2. Low volume chest with mild atelectasis.  Electronically Signed   By: Jorje Guild M.D.   On: 02/20/2022 04:13   US Paracentesis  Result Date: 02/19/2022 INDICATION: Ascites secondary to cirrhosis EXAM: ULTRASOUND GUIDED  PARACENTESIS MEDICATIONS: None. COMPLICATIONS: None immediate. PROCEDURE: Informed written consent was obtained from the patient after a discussion of the risks, benefits and alternatives to treatment. A timeout was performed prior to the initiation of  the procedure. Initial ultrasound scanning demonstrates a large amount of ascites within the right lower abdominal quadrant. The right lower abdomen was prepped and draped in the usual sterile fashion. 1% lidocaine was used for local anesthesia. Following this, a 19 gauge, 7-cm, Yueh catheter was introduced. An ultrasound image was saved for documentation purposes. The paracentesis was performed. The catheter was removed and a dressing was applied. The patient tolerated the procedure well without immediate post procedural complication. Patient received post-procedure intravenous albumin; see nursing notes for details. FINDINGS: A total of approximately 4.0 L of serous fluid was removed. Samples were sent to the laboratory as requested by the clinical team. IMPRESSION: Successful ultrasound-guided paracentesis yielding 4.0 L of peritoneal fluid. Electronically Signed   By: Abigail Miyamoto M.D.   On: 02/19/2022 12:13   US Paracentesis  Result Date: 02/14/2022 INDICATION: Cirrhosis with recurrent ascites EXAM: ULTRASOUND GUIDED RLQ PARACENTESIS MEDICATIONS: None. COMPLICATIONS: None immediate. PROCEDURE: Informed written consent was obtained from the patient after a discussion of the risks, benefits and alternatives to treatment. A timeout was performed prior to the initiation of the procedure. Initial ultrasound scanning demonstrates a large amount of ascites within the right lower abdominal quadrant. The right lower abdomen was prepped and draped in the usual sterile fashion. 1%  lidocaine was used for local anesthesia. Following this, a 19 gauge, 7-cm, Yueh catheter was introduced. An ultrasound image was saved for documentation purposes. The paracentesis was performed. The catheter was removed and a dressing was applied. The patient tolerated the procedure well without immediate post procedural complication. Patient received post-procedure intravenous albumin; see nursing notes for details. FINDINGS: A total of approximately 3.4L of peritoneal fluid was removed. Samples were sent to the laboratory as requested by the clinical team. IMPRESSION: Successful ultrasound-guided paracentesis yielding 3.4 liters of peritoneal fluid. PLAN: The patient has required >/=2 paracenteses in a 30 day period and a formal evaluation by the [facility] has been arranged. Electronically Signed   By: Lavonia Dana M.D.   On: 02/14/2022 10:56   DG Abdomen Acute W/Chest  Result Date: 02/13/2022 CLINICAL DATA:  Short of breath, abdominal pain. History of cirrhosis EXAM: DG ABDOMEN ACUTE WITH 1 VIEW CHEST COMPARISON:  Chest and abdomen 02/04/2022 FINDINGS: Heart size and vascularity normal. Mild bibasilar atelectasis. No pleural effusion Normal bowel gas pattern. No bowel obstruction or free air. Negative for urinary tract calculi. IMPRESSION: 1. Mild bibasilar atelectasis. 2. Normal bowel gas pattern. Electronically Signed   By: Franchot Gallo M.D.   On: 02/13/2022 16:21    Orson Eva, DO  [facility]  If 7PM-7AM, please contact night-coverage [email or web address] Password [email or web address] 03/12/2022, 10:58 AM   LOS: 2 days

## 2022-03-12 NOTE — Progress Notes (Signed)
Have increased oxygen slowly during night from 40 to 55 percent. Blood Gas confirms.

## 2022-03-12 NOTE — Progress Notes (Signed)
[facility] Progress Note Patient Name: Edward [name] DOB: July 02, 1950 MRN: [id number]   Date of Service  03/12/2022  HPI/Events of Note  Patient with elevated CVP, soft blood pressures, and sub-optimal sedation.  eICU Interventions  Vasopressin gtt added to the MAR, Fentanyl gtt added to the MAR, Lasix 20 mg iv x 1.        Kerry Kass Fabiana Dromgoole 03/12/2022, 9:55 PM

## 2022-03-13 ENCOUNTER — Inpatient Hospital Stay (HOSPITAL_COMMUNITY): Payer: 59

## 2022-03-13 DIAGNOSIS — K7290 Hepatic failure, unspecified without coma: Secondary | ICD-10-CM | POA: Diagnosis not present

## 2022-03-13 DIAGNOSIS — T17908D Unspecified foreign body in respiratory tract, part unspecified causing other injury, subsequent encounter: Secondary | ICD-10-CM | POA: Diagnosis not present

## 2022-03-13 DIAGNOSIS — Z7189 Other specified counseling: Secondary | ICD-10-CM | POA: Diagnosis not present

## 2022-03-13 DIAGNOSIS — N179 Acute kidney failure, unspecified: Secondary | ICD-10-CM | POA: Diagnosis not present

## 2022-03-13 DIAGNOSIS — Z515 Encounter for palliative care: Secondary | ICD-10-CM | POA: Diagnosis not present

## 2022-03-13 DIAGNOSIS — D696 Thrombocytopenia, unspecified: Secondary | ICD-10-CM | POA: Diagnosis not present

## 2022-03-13 DIAGNOSIS — K7460 Unspecified cirrhosis of liver: Secondary | ICD-10-CM | POA: Diagnosis not present

## 2022-03-13 DIAGNOSIS — J9600 Acute respiratory failure, unspecified whether with hypoxia or hypercapnia: Secondary | ICD-10-CM | POA: Diagnosis not present

## 2022-03-13 DIAGNOSIS — K922 Gastrointestinal hemorrhage, unspecified: Secondary | ICD-10-CM | POA: Diagnosis not present

## 2022-03-13 DIAGNOSIS — R578 Other shock: Secondary | ICD-10-CM | POA: Diagnosis not present

## 2022-03-13 LAB — COMPREHENSIVE METABOLIC PANEL
ALT: 20 U/L (ref 0–44)
AST: 25 U/L (ref 15–41)
Albumin: 4.1 g/dL (ref 3.5–5.0)
Alkaline Phosphatase: 35 U/L — ABNORMAL LOW (ref 38–126)
Anion gap: 14 (ref 5–15)
BUN: 78 mg/dL — ABNORMAL HIGH (ref 8–23)
CO2: 19 mmol/L — ABNORMAL LOW (ref 22–32)
Calcium: 8.6 mg/dL — ABNORMAL LOW (ref 8.9–10.3)
Chloride: 103 mmol/L (ref 98–111)
Creatinine, Ser: 3.78 mg/dL — ABNORMAL HIGH (ref 0.61–1.24)
GFR, Estimated: 17 mL/min — ABNORMAL LOW (ref >60)
Glucose, Bld: 224 mg/dL — ABNORMAL HIGH (ref 70–99)
Potassium: 4 mmol/L (ref 3.5–5.1)
Sodium: 136 mmol/L (ref 135–145)
Total Bilirubin: 10.9 mg/dL — ABNORMAL HIGH (ref 0.3–1.2)
Total Protein: 5.6 g/dL — ABNORMAL LOW (ref 6.5–8.1)

## 2022-03-13 LAB — BPAM FFP
Blood Product Expiration Date: 202311071257
ISSUE DATE / TIME: 202311061437
Unit Type and Rh: 2800

## 2022-03-13 LAB — CBC
HCT: 25.2 % — ABNORMAL LOW (ref 39.0–52.0)
HCT: 34.1 % — ABNORMAL LOW (ref 39.0–52.0)
HCT: 29.2 % — ABNORMAL LOW (ref 39.0–52.0)
Hemoglobin: 8.7 g/dL — ABNORMAL LOW (ref 13.0–17.0)
Hemoglobin: 11.2 g/dL — ABNORMAL LOW (ref 13.0–17.0)
Hemoglobin: 10.2 g/dL — ABNORMAL LOW (ref 13.0–17.0)
MCH: 31.5 pg (ref 26.0–34.0)
MCH: 31.6 pg (ref 26.0–34.0)
MCH: 32 pg (ref 26.0–34.0)
MCHC: 34.5 g/dL (ref 30.0–36.0)
MCHC: 32.8 g/dL (ref 30.0–36.0)
MCHC: 34.9 g/dL (ref 30.0–36.0)
MCV: 91.3 fL (ref 80.0–100.0)
MCV: 96.3 fL (ref 80.0–100.0)
MCV: 91.5 fL (ref 80.0–100.0)
Platelets: 46 10*3/uL — ABNORMAL LOW (ref 150–400)
Platelets: 62 10*3/uL — ABNORMAL LOW (ref 150–400)
Platelets: 54 10*3/uL — ABNORMAL LOW (ref 150–400)
RBC: 2.76 MIL/uL — ABNORMAL LOW (ref 4.22–5.81)
RBC: 3.54 MIL/uL — ABNORMAL LOW (ref 4.22–5.81)
RBC: 3.19 MIL/uL — ABNORMAL LOW (ref 4.22–5.81)
RDW: 19.2 % — ABNORMAL HIGH (ref 11.5–15.5)
RDW: 19.1 % — ABNORMAL HIGH (ref 11.5–15.5)
RDW: 19.2 % — ABNORMAL HIGH (ref 11.5–15.5)
WBC: 4.6 10*3/uL (ref 4.0–10.5)
WBC: 6.7 10*3/uL (ref 4.0–10.5)
WBC: 8.8 10*3/uL (ref 4.0–10.5)
nRBC: 0 % (ref 0.0–0.2)
nRBC: 0.3 % — ABNORMAL HIGH (ref 0.0–0.2)
nRBC: 0 % (ref 0.0–0.2)

## 2022-03-13 LAB — HEMOGLOBIN AND HEMATOCRIT, BLOOD
HCT: 29 % — ABNORMAL LOW (ref 39.0–52.0)
HCT: 28.1 % — ABNORMAL LOW (ref 39.0–52.0)
HCT: 28.5 % — ABNORMAL LOW (ref 39.0–52.0)
HCT: 28.4 % — ABNORMAL LOW (ref 39.0–52.0)
Hemoglobin: 9.9 g/dL — ABNORMAL LOW (ref 13.0–17.0)
Hemoglobin: 9.7 g/dL — ABNORMAL LOW (ref 13.0–17.0)
Hemoglobin: 9.8 g/dL — ABNORMAL LOW (ref 13.0–17.0)
Hemoglobin: 9.7 g/dL — ABNORMAL LOW (ref 13.0–17.0)

## 2022-03-13 LAB — BLOOD GAS, ARTERIAL
Acid-base deficit: 3.1 mmol/L — ABNORMAL HIGH (ref 0.0–2.0)
Bicarbonate: 21.2 mmol/L (ref 20.0–28.0)
Drawn by: 22179
FIO2: 80 %
O2 Saturation: 95 %
Patient temperature: 36.2
pCO2 arterial: 34 mmHg (ref 32–48)
pH, Arterial: 7.4 (ref 7.35–7.45)
pO2, Arterial: 67 mmHg — ABNORMAL LOW (ref 83–108)

## 2022-03-13 LAB — GLUCOSE, CAPILLARY
Glucose-Capillary: 172 mg/dL — ABNORMAL HIGH (ref 70–99)
Glucose-Capillary: 177 mg/dL — ABNORMAL HIGH (ref 70–99)
Glucose-Capillary: 202 mg/dL — ABNORMAL HIGH (ref 70–99)
Glucose-Capillary: 202 mg/dL — ABNORMAL HIGH (ref 70–99)
Glucose-Capillary: 224 mg/dL — ABNORMAL HIGH (ref 70–99)
Glucose-Capillary: 252 mg/dL — ABNORMAL HIGH (ref 70–99)

## 2022-03-13 LAB — PREPARE FRESH FROZEN PLASMA

## 2022-03-13 LAB — BPAM PLATELET PHERESIS
Blood Product Expiration Date: 202311062359
ISSUE DATE / TIME: 202311061227
Unit Type and Rh: 5100

## 2022-03-13 LAB — PREPARE PLATELET PHERESIS: Unit division: 0

## 2022-03-13 MED ORDER — INSULIN GLARGINE-YFGN 100 UNIT/ML ~~LOC~~ SOLN
5.0000 [IU] | Freq: Every day | SUBCUTANEOUS | Status: DC
  Administered 2022-03-13 – 2022-03-15 (×3): 5 [IU] via SUBCUTANEOUS
  Filled 2022-03-13 (×4): qty 0.05

## 2022-03-13 MED ORDER — ORAL CARE MOUTH RINSE
15.0000 mL | OROMUCOSAL | Status: DC
  Administered 2022-03-13 – 2022-03-15 (×24): 15 mL via OROMUCOSAL

## 2022-03-13 MED FILL — Medication: Qty: 1 | Status: AC

## 2022-03-13 NOTE — Progress Notes (Signed)
Patient noted to have had a 15 beat run of V-tach. Dr Tat made aware. Writer was in the room during episode, there appeared to be no signs or symptoms of discomfort or distress as patient appeared to be resting comfortably.

## 2022-03-13 NOTE — Progress Notes (Addendum)
Patient agitated and jerking his hands and feets abruptly, tried to pull out his tube, sedation increased and bolus dose given. Even with the increased sedation, patient agitation continues,  Dr tat notified. Asked for restraint order, had non violent soft wrist restraints on. Family on bedside, explained about the restraint. Before application of the restraint, the patient has skin discoloration and bruises noted over the hands/wrists/arms.Will continue to monitor.

## 2022-03-13 NOTE — Inpatient Diabetes Management (Signed)
Inpatient Diabetes Program Recommendations  AACE/ADA: New Consensus Statement on Inpatient Glycemic Control  Target Ranges:  Prepandial:   less than 140 mg/dL      Peak postprandial:   less than 180 mg/dL (1-2 hours)      Critically ill patients:  140 - 180 mg/dL    Latest Reference Range & Units 03/13/22 00:02 03/13/22 04:25 03/13/22 07:41  Glucose-Capillary 70 - 99 mg/dL 252 (H) 224 (H) 202 (H)    Latest Reference Range & Units 03/12/22 07:44 03/12/22 11:04 03/12/22 15:50 03/12/22 20:02  Glucose-Capillary 70 - 99 mg/dL 230 (H) 233 (H) 229 (H) 201 (H)   Review of Glycemic Control  Diabetes history: DM2 Outpatient Diabetes medications:Tradjenta 5 mg daily Current orders for Inpatient glycemic control: Novolog 0-6 units Q4H   Inpatient Diabetes Program Recommendations:     Insulin: Please consider ordering Semglee 7 units Q24H.   Thanks,  Barnie Alderman, RN, MSN, [place] Diabetes Coordinator Inpatient Diabetes Program [phone] (Team Pager from 8am to [place])

## 2022-03-13 NOTE — Progress Notes (Signed)
PROGRESS NOTE  [name] OHY:[id number] DOB: 06/18/1950 DOA: 03/23/2022 PCP: [name], FNP  Brief History:  71 year old male with a history of diabetes mellitus type 2, hypertension, CKD stage III, coronary artery disease, cryptogenic liver cirrhosis with esophageal varices, coronary disease, hyperlipidemia presenting with an episode of unresponsiveness and spitting up blood.  Notably, the patient was recently hospitalized at [facility] from 02/20/2022 to 03/01/2022 due to hemorrhagic shock from bleeding esophageal varices.  The patient became hypotensive and obtunded.  He was intubated at that time for airway protection.  The patient went underwent EGD on 02/20/2022 which revealed grade 3 esophageal varices with active bleeding.  The varices were banded x6.  He was transfused 8 units PRBC and 2 units FFP.  Palliative medicine was consulted during the admission.  And full scope of care was continued.  The patient was discharged home with hemoglobin 9.5 and serum creatinine 2.26. In the ED, the patient was tachycardic with soft blood pressures with SBP in the 90s and low 100s.  WBC 6.0, hemoglobin 10.1, platelets 61,000.  Sodium 132, potassium 5.5, bicarbonate 19, serum creatinine 3.52.  EKG shows a sinus rhythm without any ST-T wave changes.  UA showed 21-50 WBC.  he went to to the hepatology service at [facility] 12/02/2021--?  Hepatorenal syndrome etc. there was question of Wilson's disease given abnormal copper testing--- he had negative ANA mitochondrial antibodies anti-smooth muscle IgG IgA IgM and negative viral hepatitis panels but has never had a liver biopsy Because of improved MELD score and lack of transportation they declined putting him on the transplant service and recommended he follow closely with hepatology at [facility] hepatology and family was given information about the same  On the evening of 03/19/2022, the patient developed hematemesis.  He became  hypotensive. On the morning of 03/25/2022, 2 units emergent PRBC and FFP were ordered.  GI was consulted to assist with management.  Patient was placed on Levophed. He underwent EGD 03/14/2022 which showed Four columns of grade III varices with stigmata of recent bleeding were found in the middle third of the esophagus and in the lower third of the esophagus.  Three bands were successfully placed with complete eradication, resulting in deflation of varices.He was left intubated after the procedure.  However, patient self-extubated on 03/12/22.   Assessment/Plan: Hemorrhagic shock/acute blood loss anemia -Secondary to GI bleed, likely variceal bleed versus bleeding from portal hypertensive gastropathy -3 units PRBC transfused this admission -3 units FFP ordered for transfusion this admission -2 unit platelets given -GI consulted>>EGD 11/5 -11/5 EGD>>Four columns of grade III varices with stigmata of recent bleeding.  Banding x 3 -Serial H&H -Continue ceftriaxone for SBP prophylaxis>>broaded to zosyn for aspiration -Continue octreotide -continue pantoprazole drip -Remain n.p.o. for now -discontinue midodrine as no modality to give being on vent and unable to place OG -not a candidate for TIPS  Aspiration pneumonia -remains on ventilator with FiO2 0.8 -continue zosyn  Acute respiratory failure with hypoxia -due to aspiration pneumonia and some degree of third spacing  -remains ventilator dependent -PCCM following -reintubated 3-4 hours after self extubation 03/12/22 -personally reviewed CXR 11.6>>L>R opacities  Acute on chronic renal failure--CKD stage IIIb -Secondary to hypotension and volume depletion -Holding furosemide and spironolactone temporarily -Continue IV albumin transfusion -03/30/2022 serum creatinine peaking 3.99 -Baseline creatinine 2.2-2.4  UTI -UA 21-50 WBC -urine culture = GNR -NOT CAUTI   Decompensated liver cirrhosis -Not a candidate for TIPS -03/09/2022  MELD  30 -11/14/2021 hemoglobin A1c 6.7 -NovoLog sliding scale -he has been declined at [facility] hepatology for above reasons-he will need to follow-up with [facility]    Episode of Unresponsivness -suspect due to vagal phenomena and hemodynamic changes -currently A&O x 3 prior to decompensation   Coronary Artery Disease -no chest pain presently -personally reviewed EKG--sinus, no STT changes   Diabetes Mellitus type 2 11/14/21 A1C--6.7 -novolog sliding scale -start semglee 5  Goals of Care -remains full code -We discussed the patient's overall poor prognosis in the setting of his liver cirrhosis and recurrent life threatening bleed/shock and multisystem organ failure in the setting of his numerous other co-morbidities.   We discussed the patient has received full aggressive medical care and continues to deteriorate with multisystem failure.     Family Communication:   spouse/son updated 11/7   Consultants:  GI, palliative   Code Status:  FULL--confirmed with patient   DVT Prophylaxis:  SCDs     Procedures: As Listed in Progress Note Above   Antibiotics: Ceftriaxone 11/4>>11/5 Zosyn 11/6>>        Subjective: Remains intubated on vent.  ROS not possible  Objective: Vitals:   03/13/22 1515 03/13/22 1530 03/13/22 1545 03/13/22 1600  BP: 125/73 128/69 (!) 91/47 123/62  Pulse: 83 89 80 83  Resp: (!) 30 (!) 30 (!) 28 (!) 30  Temp:      TempSrc:      SpO2: 99% 99% 96% 98%  Weight:      Height:        Intake/Output Summary (Last 24 hours) at 03/13/2022 1611 Last data filed at 03/13/2022 1610 Gross per 24 hour  Intake 1873.02 ml  Output 1275 ml  Net 598.02 ml   Weight change: 3.5 kg Exam:  General:  Pt is intubated and sedated. HEENT: No icterus, No thrush, No neck mass, [place]/AT Cardiovascular: RRR, S1/S2, no rubs, no gallops Respiratory: bilateral rhonchi Abdomen: Soft/+BS, non tender, non distended, no guarding Extremities: 1 + LE edema, No lymphangitis, No  petechiae, No rashes, no synovitis   Data Reviewed: I have personally reviewed following labs and imaging studies Basic Metabolic Panel: Recent Labs  Lab 03/08/2022 1427 03/12/2022 0756 03/18/2022 1400 03/12/22 0140 03/13/22 0343  NA 132* 134*  --  134* 136  K 5.5* 5.3* 4.9 4.9 4.0  CL 98 100  --  101 103  CO2 19* 16*  --  22 19*  GLUCOSE 239* 226*  --  258* 224*  BUN 46* 64*  --  69* 78*  CREATININE 3.52* 3.99*  --  3.55* 3.78*  CALCIUM 8.6* 8.6*  --  8.6* 8.6*   Liver Function Tests: Recent Labs  Lab 03/07/2022 1427 04/01/2022 0756 03/12/22 0140 03/13/22 0343  AST 71* 42* 32 25  ALT <5 32 26 20  ALKPHOS 78 42 45 35*  BILITOT 3.5* 3.5* 9.1* 10.9*  PROT 5.9* 5.4* 5.8* 5.6*  ALBUMIN 3.1* 3.8 4.1 4.1   No results for input(s): "LIPASE", "AMYLASE" in the last 168 hours. Recent Labs  Lab 04/05/2022 1427 03/14/2022 1417 03/12/22 0140  AMMONIA 34 60* 41*   Coagulation Profile: Recent Labs  Lab 03/19/2022 1427 03/17/2022 0756 03/12/22 0140  INR 1.5* 2.1* 1.6*   CBC: Recent Labs  Lab 03/21/2022 1427 03/13/2022 0756 03/26/2022 1400 03/12/22 0140 03/12/22 0742 03/12/22 1203 03/12/22 1910 03/13/22 0157 03/13/22 0343 03/13/22 0659 03/13/22 1354  WBC 6.0 6.2  --  4.6  --  6.7  --   --  8.8  --   --   HGB 10.1* 6.1*   < > 8.7*   < > 11.2* 11.0* 9.9* 10.2* 9.7* 9.8*  HCT 30.4* 18.8*   < > 25.2*   < > 34.1* 33.3* 29.0* 29.2* 28.1* 28.5*  MCV 97.7 100.5*  --  91.3  --  96.3  --   --  91.5  --   --   PLT 61* 56*  --  46*  --  62*  --   --  54*  --   --    < > = values in this interval not displayed.   Cardiac Enzymes: No results for input(s): "CKTOTAL", "CKMB", "CKMBINDEX", "TROPONINI" in the last 168 hours. BNP: Invalid input(s): "POCBNP" CBG: Recent Labs  Lab 03/12/22 2002 03/13/22 0002 03/13/22 0425 03/13/22 0741 03/13/22 1134  GLUCAP 201* 252* 224* 202* 202*   HbA1C: Recent Labs    04/05/2022 1400  HGBA1C 5.3   Urine analysis:    Component Value Date/Time    COLORURINE YELLOW 03/27/2022 1550   APPEARANCEUR CLEAR 03/26/2022 1550   LABSPEC 1.011 03/16/2022 1550   PHURINE 5.0 04/01/2022 1550   GLUCOSEU 150 (A) 03/16/2022 1550   HGBUR NEGATIVE 04/01/2022 1550   BILIRUBINUR NEGATIVE 03/09/2022 1550   KETONESUR NEGATIVE 03/19/2022 1550   PROTEINUR NEGATIVE 03/21/2022 1550   NITRITE NEGATIVE 03/25/2022 1550   LEUKOCYTESUR SMALL (A) 03/28/2022 1550   Sepsis Labs: '@LABRCNTIP'$ (procalcitonin:4,lacticidven:4) ) Recent Results (from the past 240 hour(s))  Culture, body fluid w Gram Stain-bottle     Status: None   Collection Time: 03/06/22  1:20 PM   Specimen: Peritoneal Washings  Result Value Ref Range Status   Specimen Description PERITONEAL  Final   Special Requests BAA 10CC  Final   Culture   Final    NO GROWTH 5 DAYS Performed at [facility], [address] [place], [place] [address]    Report Status 03/19/2022 FINAL  Final  Gram stain     Status: None   Collection Time: 03/06/22  1:20 PM   Specimen: Peritoneal Washings  Result Value Ref Range Status   Specimen Description PERITONEAL  Final   Special Requests NONE  Final   Gram Stain   Final    NO ORGANISMS SEEN WBC PRESENT,BOTH PMN AND MONONUCLEAR CYTOSPIN SMEAR Performed at [facility], [address], [place], [place] [address]    Report Status 03/06/2022 FINAL  Final  MRSA Next Gen by PCR, Nasal     Status: None   Collection Time: 03/08/2022 12:29 PM   Specimen: Nasal Mucosa; Nasal Swab  Result Value Ref Range Status   MRSA by PCR Next Gen NOT DETECTED NOT DETECTED Final    Comment: (NOTE) The GeneXpert MRSA Assay (FDA approved for NASAL specimens only), is one component of a comprehensive MRSA colonization surveillance program. It is not intended to diagnose MRSA infection nor to guide or monitor treatment for MRSA infections. Test performance is not FDA approved in patients less than 22 years old. Performed at [facility], [address], [place], [place] [address]   Urine  Culture     Status: Abnormal (Preliminary result)   Collection Time: 03/16/2022  3:50 PM   Specimen: Urine, Catheterized  Result Value Ref Range Status   Specimen Description   Final    URINE, CATHETERIZED Performed at [facility], [address] [place], [place] [address]    Special Requests   Final    NONE Performed at [facility], [address], [place], [place] [address]    Culture (A)  Final    >=100,000 COLONIES/mL GRAM NEGATIVE RODS IDENTIFICATION AND SUSCEPTIBILITIES TO FOLLOW Performed at [facility], [place] [address], [place], [place] [address]    Report Status PENDING  Incomplete     Scheduled Meds:  sodium chloride   Intravenous Once   brimonidine  1 drop Both Eyes BID   Chlorhexidine Gluconate Cloth  6 each Topical Daily   docusate  100 mg Per Tube BID   insulin aspart  0-6 Units Subcutaneous Q4H   midodrine  20 mg Oral TID WC   mouth rinse  15 mL Mouth Rinse Q2H   pantoprazole (PROTONIX) IV  40 mg Intravenous Once   polyethylene glycol  17 g Per Tube Daily   Continuous Infusions:  sodium chloride     dexmedetomidine (PRECEDEX) IV infusion 0.9 mcg/kg/hr (03/13/22 1610)   fentaNYL infusion INTRAVENOUS 100 mcg/hr (03/13/22 1610)   norepinephrine (LEVOPHED) Adult infusion 10 mcg/min (03/13/22 0527)   octreotide (SANDOSTATIN) 500 mcg in sodium chloride 0.9 % 250 mL (2 mcg/mL) infusion 50 mcg/hr (03/13/22 1610)   pantoprazole 8 mg/hr (03/13/22 1610)   piperacillin-tazobactam (ZOSYN)  IV Stopped (03/13/22 1537)    Procedures/Studies: DG Chest Port 1 View  Result Date: 03/13/2022 CLINICAL DATA:  Acute respiratory failure with hypoxemia. EXAM: PORTABLE CHEST 1 VIEW COMPARISON:  March 12, 2022. FINDINGS: The heart size and mediastinal contours are within normal limits. Endotracheal and right internal jugular catheters are unchanged in position. Stable bilateral lung opacities are noted concerning for multifocal pneumonia or edema. The visualized skeletal  structures are unremarkable. IMPRESSION: Stable support apparatus. Stable bilateral lung opacities as described above. Electronically Signed   By: Marijo Conception M.D.   On: 03/13/2022 10:17   DG CHEST PORT 1 VIEW  Result Date: 03/12/2022 CLINICAL DATA:  Endotracheal tube placement. EXAM: PORTABLE CHEST 1 VIEW COMPARISON:  Same day. FINDINGS: Endotracheal tube is in grossly good position. Right internal jugular catheter is unchanged. Significantly increased bilateral lung opacities are noted concerning for pneumonia or possibly edema. Small bilateral pleural effusions are most likely present. Bony thorax is unremarkable. IMPRESSION: Endotracheal tube in grossly good position. Significantly increased bilateral lung opacities as described above. Electronically Signed   By: Marijo Conception M.D.   On: 03/12/2022 12:21   DG CHEST PORT 1 VIEW  Result Date: 03/12/2022 CLINICAL DATA:  Respiratory distress EXAM: PORTABLE CHEST 1 VIEW COMPARISON:  Previous studies including the examination done earlier today FINDINGS: Transverse diameter of heart is in the upper limits of normal. Diffuse alveolar densities are seen in left parahilar region and left lower lung field. Increased interstitial markings are seen in right parahilar region. No significant interval changes are noted in comparison with the immediate previous study. Right lateral CP angle is clear. There is minimal blunting of left lateral CP angle. There is no pneumothorax. Faint opacity seen in the right apex may be an artifact such as skin fold. Tip of right IJ central venous catheter is seen at the junction of superior vena cava and right atrium. IMPRESSION: Extensive interstitial and alveolar densities are seen in both lungs, more so on the left side with no significant interval change. Findings suggest multifocal pneumonia. Part of this finding may be due to pulmonary edema. Small left pleural effusion. Electronically Signed   By: Elmer Picker M.D.    On: 03/12/2022 08:00   DG CHEST PORT 1 VIEW  Result Date: 03/12/2022 CLINICAL  DATA:  Acute respiratory failure with hypoxia EXAM: PORTABLE CHEST 1 VIEW COMPARISON:  04/02/2022 FINDINGS: Endotracheal tube tip 3 cm above the carina. Central line tip at the [facility] RA junction. Worsening of infiltrate in the perihilar lung bilaterally, worse on the left than the right. This is presumed represent pneumonia, though an element of edema could be present. IMPRESSION: Worsening of perihilar infiltrate, worse on the left than the right. This is presumed to represent pneumonia, though an element of edema could be present. Electronically Signed   By: Nelson Chimes M.D.   On: 03/12/2022 07:19   DG CHEST PORT 1 VIEW  Result Date: 03/09/2022 CLINICAL DATA:  Status post retraction of a right jugular catheter. EXAM: PORTABLE CHEST 1 VIEW COMPARISON:  Earlier today. FINDINGS: The right jugular catheter has been retracted with its tip at the superior cavoatrial junction. The endotracheal tube is unchanged. Bilateral linear densities, mildly increased on the left and mildly improved on the right. This includes a persistent curvy linear density at the right lung base, simulating pneumoperitoneum. Thoracic spine degenerative changes. IMPRESSION: 1. Right jugular catheter tip at the superior cavoatrial junction. 2. Bilateral atelectasis, mildly increased on the left and mildly improved on the right. Electronically Signed   By: Claudie Revering M.D.   On: 03/18/2022 16:23   DG Chest Port 1 View  Result Date: 03/09/2022 CLINICAL DATA:  Respiratory failure.  Intubation. EXAM: PORTABLE CHEST 1 VIEW COMPARISON:  03/09/2022 and prior studies FINDINGS: Endotracheal tube is now noted with tip 3.5 cm above the carina. RIGHT IJ central venous catheter with tip overlying the RIGHT atrium again noted. This is a low volume study with slightly increasing bibasilar opacities/atelectasis. There is no evidence of pneumothorax. There may be trace  bilateral pleural effusions present. No other significant changes noted. IMPRESSION: 1. Endotracheal tube with tip 3.5 cm above the carina. 2. RIGHT IJ central venous catheter with tip overlying the RIGHT atrium, consider 3-4 cm retraction. 3. Slightly increasing bibasilar opacities/atelectasis with possible trace bilateral pleural effusions. Electronically Signed   By: Margarette Canada M.D.   On: 03/25/2022 14:03   DG Chest Port 1 View  Result Date: 04/01/2022 CLINICAL DATA:  Central line placement. Shortness of breath. Syncope. EXAM: PORTABLE CHEST 1 VIEW COMPARISON:  03/12/2022 FINDINGS: A new right jugular central venous catheter seen with tip overlying the mid right atrium, approximately 4 cm below the superior cavoatrial junction. No evidence of pneumothorax. Low lung volumes are again seen with mild bibasilar atelectasis. Evidence of pulmonary consolidation or pleural effusion. Heart size is within normal limits. IMPRESSION: New right jugular central venous catheter tip overlies the mid right atrium, approximately 4 cm below the superior cavoatrial junction. No evidence of pneumothorax. Low lung volumes and bibasilar atelectasis, without significant change. Electronically Signed   By: Marlaine Hind M.D.   On: 03/08/2022 10:25   DG Chest Portable 1 View  Result Date: 03/23/2022 CLINICAL DATA:  Shortness of breath. Syncope. EXAM: PORTABLE CHEST 1 VIEW COMPARISON:  02/21/2022 FINDINGS: Normal sized heart. Poor inspiration. Minimal linear atelectasis/scarring at both lung bases. Otherwise, clear lungs with normal vascularity. Thoracic spine degenerative changes. IMPRESSION: Poor inspiration with minimal bibasilar linear atelectasis/scarring. Electronically Signed   By: Claudie Revering M.D.   On: 03/28/2022 14:01   US Paracentesis  Result Date: 03/06/2022 INDICATION: Cirrhosis, ascites EXAM: ULTRASOUND GUIDED DIAGNOSTIC AND THERAPEUTIC PARACENTESIS MEDICATIONS: None. COMPLICATIONS: None immediate. PROCEDURE:  Informed written consent was obtained from the patient after a discussion of the risks, benefits  and alternatives to treatment. A timeout was performed prior to the initiation of the procedure. Initial ultrasound scanning demonstrates a large amount of ascites within the right lower abdominal quadrant. The right lower abdomen was prepped and draped in the usual sterile fashion. 1% lidocaine was used for local anesthesia. Following this, a 19 gauge, 7-cm, Yueh catheter was introduced. An ultrasound image was saved for documentation purposes. The paracentesis was performed. The catheter was removed and a dressing was applied. The patient tolerated the procedure well without immediate post procedural complication. Patient received post-procedure intravenous albumin; see nursing notes for details. FINDINGS: A total of approximately 5 of yellow fluid was removed. Samples were sent to the laboratory as requested by the clinical team. IMPRESSION: Successful ultrasound-guided paracentesis yielding 5 liters of peritoneal fluid. Electronically Signed   By: Lavonia Dana M.D.   On: 03/06/2022 14:00   IR Paracentesis  Result Date: 02/27/2022 INDICATION: Patient with a history of cirrhosis and recurrent ascites. Interventional radiology asked to perform a diagnostic and therapeutic paracentesis. EXAM: ULTRASOUND GUIDED PARACENTESIS MEDICATIONS: 1% lidocaine 10 mL COMPLICATIONS: None immediate. PROCEDURE: Informed written consent was obtained from the patient after a discussion of the risks, benefits and alternatives to treatment. A timeout was performed prior to the initiation of the procedure. Initial ultrasound scanning demonstrates a large amount of ascites within the right lower abdominal quadrant. The right lower abdomen was prepped and draped in the usual sterile fashion. 1% lidocaine was used for local anesthesia. Following this, a 19 gauge, 7-cm, Yueh catheter was introduced. An ultrasound image was saved for  documentation purposes. The paracentesis was performed. The catheter was removed and a dressing was applied. The patient tolerated the procedure well without immediate post procedural complication. Patient received post-procedure intravenous albumin; see nursing notes for details. FINDINGS: A total of approximately 5.4 L of clear yellow fluid was removed. Samples were sent to the laboratory as requested by the clinical team. IMPRESSION: Successful ultrasound-guided paracentesis yielding 5.4 liters of peritoneal fluid. Read by: Soyla Dryer, NP PLAN: The patient has previously been evaluated by the [facility] Interventional Radiology Portal Hypertension [facility], and deemed not a candidate for intervention at this time due to persistently elevated Na-MELD Electronically Signed   By: Ruthann Cancer M.D.   On: 02/27/2022 14:36   DG Swallowing Func-Speech Pathology  Result Date: 02/27/2022 Table formatting from the original result was not included. Objective Swallowing Evaluation: Type of Study: MBS-Modified Barium Swallow Study  Patient Details Name: DEAVION DOBBS MRN: [id number] Date of Birth: 1950-08-18 Today's Date: 02/27/2022 Time: SLP Start Time (ACUTE ONLY): 1237 -SLP Stop Time (ACUTE ONLY): 1308 SLP Time Calculation (min) (ACUTE ONLY): 18 min Past Medical History: Past Medical History: Diagnosis Date  Cataract   CKD (chronic kidney disease) stage 3, GFR 30-59 ml/min (HCC)   Coronary atherosclerosis of native coronary artery   BMS LAD 2006; residual 75% distal RCA; EF 50%  Essential hypertension   Glaucoma   Low back pain   MI (myocardial infarction) ([place])   Anterior 2006  Mixed hyperlipidemia   Renal insufficiency   Type 2 diabetes mellitus ([place])  Past Surgical History: Past Surgical History: Procedure Laterality Date  CATARACT EXTRACTION, BILATERAL    CORONARY ANGIOPLASTY WITH STENT PLACEMENT  2006  ESOPHAGEAL BANDING  02/01/2022  Procedure: ESOPHAGEAL BANDING;  Surgeon: Eloise Harman, DO;  Location: [facility]  ENDO SUITE;  Service: Endoscopy;;  ESOPHAGEAL BANDING  02/20/2022  Procedure: ESOPHAGEAL BANDING;  Surgeon: Ladene Artist, MD;  Location: [facility]  ENDOSCOPY;  Service: Gastroenterology;;  ESOPHAGOGASTRODUODENOSCOPY (EGD) WITH PROPOFOL N/A 02/01/2022  Procedure: ESOPHAGOGASTRODUODENOSCOPY (EGD) WITH PROPOFOL;  Surgeon: Eloise Harman, DO;  Location: [facility] ENDO SUITE;  Service: Endoscopy;  Laterality: N/A;  12:30 am  ESOPHAGOGASTRODUODENOSCOPY (EGD) WITH PROPOFOL N/A 02/20/2022  Procedure: ESOPHAGOGASTRODUODENOSCOPY (EGD) WITH PROPOFOL;  Surgeon: Ladene Artist, MD;  Location: [facility] ENDOSCOPY;  Service: Gastroenterology;  Laterality: N/A; HPI: AEON KESSNER is a 71 y.o. male who presented to [facility] 10/16 with cramping of the hands and feet.  He had had a paracentesis earlier that day with 4 L removed (see below).  While in ED, he began to spit up bright red blood.  This progressed to frank hematemesis, large-volume and persistent.  He was started on Protonix, octreotide, ceftriaxone.  He then became hypotensive; therefore, massive transfusion protocol was initiated.  He received 8 units PRBC, 2 units FFP, 1 unit platelets.  He was intubated for airway protection was also started on Levophed. Pt with EGD 10/18 with banding x6. CXR 10/18 without focal infiltrate. 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  He was apparently evaluated at [facility] for transplant but was deemed to not be a candidate.  No data recorded  Recommendations for follow up therapy are one component of a multi-disciplinary discharge planning process, led by the attending physician.  Recommendations may be updated based on patient status, additional functional criteria and insurance authorization. Assessment / Plan / Recommendation   02/27/2022   1:11 PM  Clinical Impressions Clinical Impression Pt presents with a mild oropharyngeal dysphagia c/b delayed swallow initiation, reduced base of tongue retraction, incomplete laryngeal closure, and diminished sensation.  These deficits resulted in penetration of thin liquid during the swallow.  Penetration was silent, but did fully clear with subsequent swallows.  There was moderate vallecula residue with all consistencies which was also reduced with subequent swallows.  With pill simulation, there was brief stasis of tablet in vallecula.  There was esophagea stasis with table which was not fully cleared by liquid wash, but did clear with puree bolus.  Recommend advancing pt to regular texture solids as medically appropriate.  Alternate liquids and solids.  SLP Visit Diagnosis Dysphagia, oropharyngeal phase (R13.12) Impact on safety and function Mild aspiration risk     02/27/2022   1:11 PM Treatment Recommendations Treatment Recommendations No treatment recommended at this time     02/27/2022   1:15 PM Prognosis Prognosis for Safe Diet Advancement --   02/27/2022   1:11 PM Diet Recommendations SLP Diet Recommendations Thin liquid;Regular solids Liquid Administration via Straw Medication Administration Whole meds with liquid Compensations Slow rate;Small sips/bites;Follow solids with liquid     02/27/2022   1:11 PM Other Recommendations Oral Care Recommendations Oral care BID Follow Up Recommendations No SLP follow up Assistance recommended at discharge -- Functional Status Assessment Patient has not had a recent decline in their functional status   02/23/2022  12:29 PM Frequency and Duration  Speech Therapy Frequency (ACUTE ONLY) min 2x/week Treatment Duration 2 weeks     02/27/2022   1:06 PM Oral Phase Oral - Thin Straw Premature spillage Oral - Puree WFL Oral - Regular [facility]    02/27/2022   1:09 PM Pharyngeal Phase Pharyngeal- Thin Straw Reduced tongue base retraction;Penetration/Aspiration during swallow Pharyngeal- Puree  Pharyngeal residue - valleculae;Reduced tongue base retraction;Delayed swallow initiation-vallecula Pharyngeal- Regular Reduced  tongue base retraction;Pharyngeal residue - valleculae Pharyngeal- Pill Reduced tongue base retraction;Pharyngeal residue - valleculae Pharyngeal Material does not enter airway    02/27/2022   1:08 PM Cervical Esophageal Phase  Cervical Esophageal Phase Impaired Celedonio Savage, MA, CCC-SLP Acute Rehabilitation Services Office: [phone] 02/27/2022, 1:20 PM                     DG Chest Port 1 View  Result Date: 02/21/2022 CLINICAL DATA:  Ventilator dependent respiratory failure. EXAM: PORTABLE CHEST 1 VIEW COMPARISON:  Portable chest yesterday at 10:23 p.m. FINDINGS: 4:59 a.m. ETT tip is 3.1 cm from the carina, left IJ central line tip in the distal SVC. There are multiple overlying monitor wires. There are low lung volumes. Mild elevation right hemidiaphragm limiting visualization of the right base. There is perihilar and right basilar linear atelectasis without focal consolidation the visualized lungs. The cardiomediastinal silhouette and vasculature are normal. There is thoracic spondylosis. IMPRESSION: Limited exam with low lung volumes and elevated right hemidiaphragm. Assessment of the bases is limited. The visualized lungs without focal infiltrates. Support apparatus as above. Electronically Signed   By: Telford Nab M.D.   On: 02/21/2022 06:18   DG CHEST PORT 1 VIEW  Result Date: 02/20/2022 CLINICAL DATA:  Central line placement EXAM: PORTABLE CHEST 1 VIEW COMPARISON:  02/20/2022 FINDINGS: Left central line is been placed with the tip in the SVC. No pneumothorax. Endotracheal tube is 5 cm above the carina. Heart normal size. No confluent opacities or effusions. IMPRESSION: Support devices in expected position as above. No acute cardiopulmonary disease. Electronically Signed   By: Rolm Baptise M.D.   On: 02/20/2022 22:30   DG Chest Port 1 View  Result Date:  02/20/2022 CLINICAL DATA:  Intubation EXAM: PORTABLE CHEST 1 VIEW COMPARISON:  02/13/2022 FINDINGS: Endotracheal tube with tip just below the clavicular heads. The enteric tube at least reaches the stomach. Artifact from EKG leads. Low volume chest with streaky density at the bases suggesting atelectasis. No effusion or pneumothorax. Skin fold at the left apex. IMPRESSION: 1. Unremarkable hardware positioning. 2. Low volume chest with mild atelectasis. Electronically Signed   By: Jorje Guild M.D.   On: 02/20/2022 04:13   US Paracentesis  Result Date: 02/19/2022 INDICATION: Ascites secondary to cirrhosis EXAM: ULTRASOUND GUIDED  PARACENTESIS MEDICATIONS: None. COMPLICATIONS: None immediate. PROCEDURE: Informed written consent was obtained from the patient after a discussion of the risks, benefits and alternatives to treatment. A timeout was performed prior to the initiation of the procedure. Initial ultrasound scanning demonstrates a large amount of ascites within the right lower abdominal quadrant. The right lower abdomen was prepped and draped in the usual sterile fashion. 1% lidocaine was used for local anesthesia. Following this, a 19 gauge, 7-cm, Yueh catheter was introduced. An ultrasound image was saved for documentation purposes. The paracentesis was performed. The catheter was removed and a dressing was applied. The patient tolerated the procedure well without immediate post procedural complication. Patient received post-procedure intravenous albumin; see nursing notes for details. FINDINGS: A total of approximately 4.0 L of serous fluid was removed. Samples were sent to the laboratory as requested by the clinical team. IMPRESSION: Successful ultrasound-guided paracentesis yielding 4.0 L of peritoneal fluid. Electronically Signed   By: Abigail Miyamoto M.D.   On: 02/19/2022 12:13   US Paracentesis  Result Date: 02/14/2022 INDICATION: Cirrhosis with recurrent ascites EXAM: ULTRASOUND GUIDED RLQ  PARACENTESIS MEDICATIONS: None. COMPLICATIONS: None immediate. PROCEDURE: Informed written consent was obtained  from the patient after a discussion of the risks, benefits and alternatives to treatment. A timeout was performed prior to the initiation of the procedure. Initial ultrasound scanning demonstrates a large amount of ascites within the right lower abdominal quadrant. The right lower abdomen was prepped and draped in the usual sterile fashion. 1% lidocaine was used for local anesthesia. Following this, a 19 gauge, 7-cm, Yueh catheter was introduced. An ultrasound image was saved for documentation purposes. The paracentesis was performed. The catheter was removed and a dressing was applied. The patient tolerated the procedure well without immediate post procedural complication. Patient received post-procedure intravenous albumin; see nursing notes for details. FINDINGS: A total of approximately 3.4L of peritoneal fluid was removed. Samples were sent to the laboratory as requested by the clinical team. IMPRESSION: Successful ultrasound-guided paracentesis yielding 3.4 liters of peritoneal fluid. PLAN: The patient has required >/=2 paracenteses in a 30 day period and a formal evaluation by the [facility] has been arranged. Electronically Signed   By: Lavonia Dana M.D.   On: 02/14/2022 10:56   DG Abdomen Acute W/Chest  Result Date: 02/13/2022 CLINICAL DATA:  Short of breath, abdominal pain. History of cirrhosis EXAM: DG ABDOMEN ACUTE WITH 1 VIEW CHEST COMPARISON:  Chest and abdomen 02/04/2022 FINDINGS: Heart size and vascularity normal. Mild bibasilar atelectasis. No pleural effusion Normal bowel gas pattern. No bowel obstruction or free air. Negative for urinary tract calculi. IMPRESSION: 1. Mild bibasilar atelectasis. 2. Normal bowel gas pattern. Electronically Signed   By: Franchot Gallo M.D.   On: 02/13/2022 16:21    Orson Eva, DO  [facility]  If 7PM-7AM, please contact night-coverage [email or web address] Password [email or web address] 03/13/2022, 4:11 PM   LOS: 3 days

## 2022-03-13 NOTE — Progress Notes (Signed)
Palliative: Edward [name] is lying quietly in bed.  He is intubated/ventilated and sedated.  He appears acutely/chronically ill and quite frail.  He clearly cannot make his basic needs known.  There is no family at bedside at this time.  Returned to room to find family at bedside.  Present is wife Toney Rakes (all family endorse that [place] and [place] ARE NOT divorced) sons [name] and Maki, and Lelan Pons who is both Marinda's sister and Sabien's brother Johnny's wife.    We talk in detail about Mr. Macke acute and chronic health concerns.  We talk about ventilator settings, oxygenation and ventilation.  We talk about the treatment plan.  We talk about time for outcomes.  I share what worries me and ask what worries the family.  [name] states that his concern is unburdening Mr. Westfall from the ventilator if that is not his father's choice.  We talk about time for outcomes.  Conference with attending, GI NP, bedside nursing staff, respiratory therapy, transition of care team related to patient condition, needs, goals of care, disposition.  Plan:   At this point remains full scope/full code.  Georgeann Oppenheim is legally married to [place].  Family is making decisions as a team.  Time for outcomes.  Appears to be actively dying, in-hospital death in the next 24 to 48 hours would not be surprising.  PMT to continue to follow.  22 minutes  Quinn Axe, NP Palliative Medicine team  Team phone [phone] Greater than 50% of this time was spent counseling and coordinating care related to the assessment and plan.

## 2022-03-13 NOTE — Plan of Care (Signed)

## 2022-03-13 NOTE — Progress Notes (Signed)
NAME:  Edward [name], MRN:  [id number], DOB:  [birth year], LOS: 3 ADMISSION DATE:  03/13/2022, CONSULTATION DATE:  03/12/22  REFERRING MD:  [facility], CHIEF COMPLAINT:  acute resp failure / vent dep p EGD for varices   History of Present Illness:  85 yowm very remote smoker  with near endstage cirrhosis with ascites /varices admit 11/4 with hematemeis > banding varices 11/5 and self extubated 11/6 am and re-ET sev hours later and PCCM asked to consult with apparent aspiration injury  L >R lung    Pertinent  Medical History   15 yowm  with medical history significant of decompensated cirrhosis of the liver with ascites and esophageal varices, type 2 diabetes, stage III chronic kidney disease, coronary artery disease with stents, hypertension, history of MI, hyperlipidemia.  Patient brought to the hospital due to spitting up blood x 1.  Due to concerns, his wife called EMS and the patient was brought to the hospital for evaluation.  Patient had been hospitalized recently for extensive esophageal variceal bleeding requiring multiple units of blood transfusion and admission to the ICU.  The patient's has not had any more hematemesis or hematochezia.  He does have extensive ascites and he stated that his abdomen felt  tight.         Significant Hospital Events: Including procedures, antibiotic start and stop dates in addition to other pertinent events   EGD  11/5 > Grade 3 varieces > banding and remained intubated R IJ  11/5  MRSA Screen 11/5 neg  Urine culture 11/5  > 100k gnr>>> Self extubated am 11/6 and reintubated noon 11/6    Scheduled Meds:  sodium chloride   Intravenous Once   brimonidine  1 drop Both Eyes BID   Chlorhexidine Gluconate Cloth  6 each Topical Daily   docusate  100 mg Per Tube BID   insulin aspart  0-6 Units Subcutaneous Q4H   lactulose  300 mL Rectal BID   midodrine  20 mg Oral TID WC   mouth rinse  15 mL Mouth Rinse Q2H   pantoprazole (PROTONIX) IV  40 mg Intravenous  Once   polyethylene glycol  17 g Per Tube Daily   Continuous Infusions:  sodium chloride     dexmedetomidine (PRECEDEX) IV infusion 0.09 mcg/kg/hr (03/13/22 0353)   fentaNYL infusion INTRAVENOUS 100 mcg/hr (03/13/22 0105)   norepinephrine (LEVOPHED) Adult infusion 10 mcg/min (03/13/22 0527)   octreotide (SANDOSTATIN) 500 mcg in sodium chloride 0.9 % 250 mL (2 mcg/mL) infusion 50 mcg/hr (03/13/22 0142)   pantoprazole 8 mg/hr (03/13/22 0051)   piperacillin-tazobactam (ZOSYN)  IV 3.375 g (03/12/22 2353)   PRN Meds:.fentaNYL (SUBLIMAZE) injection, fentaNYL (SUBLIMAZE) injection, ipratropium-albuterol, ondansetron **OR** ondansetron (ZOFRAN) IV, mouth rinse    Interim History / Subjective:  Requiring low dose levophed for bp and fent/precedex for sedation, still on PCV at now 100% with Peep 10 but not on targeted tidal vol yet   Objective   Blood pressure 101/62, pulse 81, temperature (!) 97.2 F (36.2 C), temperature source Axillary, resp. rate (!) 22, height '5\' 10"'$  (1.778 m), weight 73.5 kg, SpO2 100 %. CVP:  [2 mmHg-49 mmHg] 9 mmHg  Vent Mode: PCV FiO2 (%):  [100 %] 100 % Set Rate:  [20 bmp-28 bmp] 28 bmp PEEP:  [8 cmH20-10 cmH20] 10 cmH20 Plateau Pressure:  [30 cmH20-35 cmH20] 34 cmH20   Intake/Output Summary (Last 24 hours) at 03/13/2022 0845 Last data filed at 03/13/2022 0626 Gross per 24 hour  Intake 2721.96  ml  Output 1645 ml  Net 1076.96 ml   Filed Weights   03/07/2022 1324 03/12/22 0610 03/13/22 0623  Weight: 65.8 kg 70 kg 73.5 kg    Examination: Tmax:  97.9 General appearance:    sedated slt icteric elderly wm/ sedated on vent    No jvd Oropharynx et/ no NG or OG/ secretions bloody Neck supple Lungs with coarse rhonchi  L > R s air trapping  RRR no s3 or or sign murmur Abd mod distended but soft/ limited  excursion  Extr warm with no edema or clubbing noted Neuro  Sensorium sedated ,  no apparent motor deficits             Assessment & Plan:  1) Acute  resp failure/ vent dep p EGD/self extubation with likely significant aspiration injury in pt with near endstage Cirrhosis - Zosyn 11/6 >>> >>> PCV ok to use with target Vt around 6/7 cc/kg to avoid volutrauma/ Fio2 settings from ards protocol (the lower range one)   2) AKI superimposed on CRI with risk circulatory shock making things much worse here Lab Results  Component Value Date   CREATININE 3.78 (H) 03/13/2022   CREATININE 3.55 (H) 03/12/2022   CREATININE 3.99 (H) 03/28/2022   >>>volume expanded  to cvp 8-10 and no  use of pressors /toxins    3) acute blood loss anemia / thrombocytopenia / coagulopathy  Lab Results  Component Value Date   HGB 9.7 (L) 03/13/2022   HGB 10.2 (L) 03/13/2022   HGB 9.9 (L) 03/13/2022   HGB 12.2 (L) 11/14/2021   HGB 12.5 (L) 08/15/2021   HGB 11.9 (L) 05/23/2021    Lab Results  Component Value Date   INR 1.6 (H) 03/12/2022   INR 2.1 (H) 03/17/2022   INR 1.5 (H) 04/04/2022    Lab Results  Component Value Date   PLT 54 (L) 03/13/2022   PLT 62 (L) 03/12/2022   PLT 46 (L) 03/12/2022   PLT 61 (LL) 11/14/2021   PLT 64 (LL) 08/15/2021   PLT 63 (LL) 05/23/2021   >>> good response to RX with restored CVP also     4) Circulatory shock likely SIRS from Aspiration or sepsis from UTI / doubt HCAP  - CVP ok,hgb ok but ?  levophed dep albeit low doses and no vasopressin required am 11/7  >>> wean off if possible for Meant bp > 65    Labs   CBC: Recent Labs  Lab 03/13/2022 1427 03/13/2022 0756 03/19/2022 1400 03/12/22 0140 03/12/22 0742 03/12/22 1203 03/12/22 1910 03/13/22 0157 03/13/22 0343 03/13/22 0659  WBC 6.0 6.2  --  4.6  --  6.7  --   --  8.8  --   HGB 10.1* 6.1*   < > 8.7*   < > 11.2* 11.0* 9.9* 10.2* 9.7*  HCT 30.4* 18.8*   < > 25.2*   < > 34.1* 33.3* 29.0* 29.2* 28.1*  MCV 97.7 100.5*  --  91.3  --  96.3  --   --  91.5  --   PLT 61* 56*  --  46*  --  62*  --   --  54*  --    < > = values in this interval not displayed.    Basic  Metabolic Panel: Recent Labs  Lab 03/31/2022 1427 03/09/2022 0756 03/07/2022 1400 03/12/22 0140 03/13/22 0343  NA 132* 134*  --  134* 136  K 5.5* 5.3* 4.9 4.9 4.0  CL 98 100  --  101 103  CO2 19* 16*  --  22 19*  GLUCOSE 239* 226*  --  258* 224*  BUN 46* 64*  --  69* 78*  CREATININE 3.52* 3.99*  --  3.55* 3.78*  CALCIUM 8.6* 8.6*  --  8.6* 8.6*   GFR: Estimated Creatinine Clearance: 19 mL/min (A) (by C-G formula based on SCr of 3.78 mg/dL (H)). Recent Labs  Lab 04/03/2022 0756 03/12/2022 1400 03/12/22 0140 03/12/22 0742 03/12/22 1203 03/13/22 0343  PROCALCITON  --  0.77  --  0.64  --   --   WBC 6.2  --  4.6  --  6.7 8.8    Liver Function Tests: Recent Labs  Lab 04/02/2022 1427 03/20/2022 0756 03/12/22 0140 03/13/22 0343  AST 71* 42* 32 25  ALT <5 32 26 20  ALKPHOS 78 42 45 35*  BILITOT 3.5* 3.5* 9.1* 10.9*  PROT 5.9* 5.4* 5.8* 5.6*  ALBUMIN 3.1* 3.8 4.1 4.1   No results for input(s): "LIPASE", "AMYLASE" in the last 168 hours. Recent Labs  Lab 03/26/2022 1427 03/14/2022 1417 03/12/22 0140  AMMONIA 34 60* 41*    ABG    Component Value Date/Time   PHART 7.29 (L) 03/12/2022 1954   PCO2ART 32 03/12/2022 1954   PO2ART 55 (L) 03/12/2022 1954   HCO3 15.6 (L) 03/12/2022 1954   TCO2 20 (L) 02/20/2022 0638   ACIDBASEDEF 10.4 (H) 03/12/2022 1954   O2SAT 91.5 03/12/2022 1954     Coagulation Profile: Recent Labs  Lab 04/01/2022 1427 04/02/2022 0756 03/12/22 0140  INR 1.5* 2.1* 1.6*    Cardiac Enzymes: No results for input(s): "CKTOTAL", "CKMB", "CKMBINDEX", "TROPONINI" in the last 168 hours.  HbA1C: HB A1C (BAYER DCA - WAIVED)  Date/Time Value Ref Range Status  11/14/2021 08:18 AM 6.7 (H) 4.8 - 5.6 % Final    Comment:             Prediabetes: 5.7 - 6.4          Diabetes: >6.4          Glycemic control for adults with diabetes: <7.0   08/15/2021 09:01 AM 7.3 (H) 4.8 - 5.6 % Final    Comment:             Prediabetes: 5.7 - 6.4          Diabetes: >6.4           Glycemic control for adults with diabetes: <7.0    Hgb A1c MFr Bld  Date/Time Value Ref Range Status  03/14/2022 02:00 PM 5.3 4.8 - 5.6 % Final    Comment:    (NOTE) Pre diabetes:          5.7%-6.4%  Diabetes:              >6.4%  Glycemic control for   <7.0% adults with diabetes   11/27/2021 03:00 PM 6.5 (H) 4.8 - 5.6 % Final    Comment:    (NOTE) Pre diabetes:          5.7%-6.4%  Diabetes:              >6.4%  Glycemic control for   <7.0% adults with diabetes     CBG: Recent Labs  Lab 03/12/22 1550 03/12/22 2002 03/13/22 0002 03/13/22 0425 03/13/22 0741  GLUCAP 229* 201* 252* 224* 202*       The patient is critically ill with multiple organ systems failure and requires high complexity decision making for assessment and support, frequent  evaluation and titration of therapies, application of advanced monitoring technologies and extensive interpretation of multiple databases. Critical Care Time devoted to patient care services described in this note is 45   minutes.   Christinia Gully, MD [facility] [place] [phone]   After 7:00 pm call Elink  [phone]

## 2022-03-13 NOTE — Progress Notes (Addendum)
Gastroenterology Progress Note    Primary Care Physician:  [name], [place] Primary Gastroenterologist:  Dr. Abbey Chatters  Patient ID: Edward [name] [facility]; [id number]; February 26, 1951    Subjective   Sedated on vent. Per nursing staff, no overt melena or hematochezia. Oral suctioning is brown. No family at bedside this morning.    Objective   Vital signs in last 24 hours Temp:  [96.3 F (35.7 C)-97.9 F (36.6 C)] 97.2 F (36.2 C) (11/07 0700) Pulse Rate:  [36-146] 74 (11/07 0630) Resp:  [15-31] 28 (11/07 0630) BP: (53-163)/(36-91) 103/60 (11/07 0630) SpO2:  [55 %-100 %] 100 % (11/07 0630) FiO2 (%):  [100 %] 100 % (11/07 0400) Weight:  [73.5 kg] 73.5 kg (11/07 0623) Last BM Date : 03/12/22  Physical Exam General:   Sedated on vent, no distress Head:  Normocephalic and atraumatic. Abdomen:  Bowel sounds present, distended but soft, no TTP, rebound, or guarding Neurologic:  unable to assess    Intake/Output from previous day: 11/06 0701 - 11/07 0700 In: 2850.6 [I.V.:1515.7; Blood:1162; IV Piggyback:172.9] Out: 3810 [Urine:825] Intake/Output this shift: No intake/output data recorded.  Lab Results  Recent Labs    03/12/22 0140 03/12/22 0742 03/12/22 1203 03/12/22 1910 03/13/22 0157 03/13/22 0343 03/13/22 0659  WBC 4.6  --  6.7  --   --  8.8  --   HGB 8.7*   < > 11.2*   < > 9.9* 10.2* 9.7*  HCT 25.2*   < > 34.1*   < > 29.0* 29.2* 28.1*  PLT 46*  --  62*  --   --  54*  --    < > = values in this interval not displayed.   BMET Recent Labs    03/28/2022 0756 03/22/2022 1400 03/12/22 0140 03/13/22 0343  NA 134*  --  134* 136  K 5.3* 4.9 4.9 4.0  CL 100  --  101 103  CO2 16*  --  22 19*  GLUCOSE 226*  --  258* 224*  BUN 64*  --  69* 78*  CREATININE 3.99*  --  3.55* 3.78*  CALCIUM 8.6*  --  8.6* 8.6*   LFT Recent Labs    03/18/2022 0756 03/12/22 0140 03/13/22 0343  PROT 5.4* 5.8* 5.6*  ALBUMIN 3.8 4.1 4.1  AST 42* 32 25  ALT 32 26 20  ALKPHOS 42 45 35*   BILITOT 3.5* 9.1* 10.9*   PT/INR Recent Labs    03/23/2022 0756 03/12/22 0140  LABPROT 23.2* 19.2*  INR 2.1* 1.6*    Studies/Results DG CHEST PORT 1 VIEW  Result Date: 03/12/2022 CLINICAL DATA:  Endotracheal tube placement. EXAM: PORTABLE CHEST 1 VIEW COMPARISON:  Same day. FINDINGS: Endotracheal tube is in grossly good position. Right internal jugular catheter is unchanged. Significantly increased bilateral lung opacities are noted concerning for pneumonia or possibly edema. Small bilateral pleural effusions are most likely present. Bony thorax is unremarkable. IMPRESSION: Endotracheal tube in grossly good position. Significantly increased bilateral lung opacities as described above. Electronically Signed   By: Marijo Conception M.D.   On: 03/12/2022 12:21   DG CHEST PORT 1 VIEW  Result Date: 03/12/2022 CLINICAL DATA:  Respiratory distress EXAM: PORTABLE CHEST 1 VIEW COMPARISON:  Previous studies including the examination done earlier today FINDINGS: Transverse diameter of heart is in the upper limits of normal. Diffuse alveolar densities are seen in left parahilar region and left lower lung field. Increased interstitial markings are seen in right parahilar region. No significant interval changes  are noted in comparison with the immediate previous study. Right lateral CP angle is clear. There is minimal blunting of left lateral CP angle. There is no pneumothorax. Faint opacity seen in the right apex may be an artifact such as skin fold. Tip of right IJ central venous catheter is seen at the junction of superior vena cava and right atrium. IMPRESSION: Extensive interstitial and alveolar densities are seen in both lungs, more so on the left side with no significant interval change. Findings suggest multifocal pneumonia. Part of this finding may be due to pulmonary edema. Small left pleural effusion. Electronically Signed   By: Elmer Picker M.D.   On: 03/12/2022 08:00   DG CHEST PORT 1  VIEW  Result Date: 03/12/2022 CLINICAL DATA:  Acute respiratory failure with hypoxia EXAM: PORTABLE CHEST 1 VIEW COMPARISON:  03/25/2022 FINDINGS: Endotracheal tube tip 3 cm above the carina. Central line tip at the [facility] RA junction. Worsening of infiltrate in the perihilar lung bilaterally, worse on the left than the right. This is presumed represent pneumonia, though an element of edema could be present. IMPRESSION: Worsening of perihilar infiltrate, worse on the left than the right. This is presumed to represent pneumonia, though an element of edema could be present. Electronically Signed   By: Nelson Chimes M.D.   On: 03/12/2022 07:19   DG CHEST PORT 1 VIEW  Result Date: 03/12/2022 CLINICAL DATA:  Status post retraction of a right jugular catheter. EXAM: PORTABLE CHEST 1 VIEW COMPARISON:  Earlier today. FINDINGS: The right jugular catheter has been retracted with its tip at the superior cavoatrial junction. The endotracheal tube is unchanged. Bilateral linear densities, mildly increased on the left and mildly improved on the right. This includes a persistent curvy linear density at the right lung base, simulating pneumoperitoneum. Thoracic spine degenerative changes. IMPRESSION: 1. Right jugular catheter tip at the superior cavoatrial junction. 2. Bilateral atelectasis, mildly increased on the left and mildly improved on the right. Electronically Signed   By: Claudie Revering M.D.   On: 04/01/2022 16:23   DG Chest Port 1 View  Result Date: 03/14/2022 CLINICAL DATA:  Respiratory failure.  Intubation. EXAM: PORTABLE CHEST 1 VIEW COMPARISON:  03/19/2022 and prior studies FINDINGS: Endotracheal tube is now noted with tip 3.5 cm above the carina. RIGHT IJ central venous catheter with tip overlying the RIGHT atrium again noted. This is a low volume study with slightly increasing bibasilar opacities/atelectasis. There is no evidence of pneumothorax. There may be trace bilateral pleural effusions present. No other  significant changes noted. IMPRESSION: 1. Endotracheal tube with tip 3.5 cm above the carina. 2. RIGHT IJ central venous catheter with tip overlying the RIGHT atrium, consider 3-4 cm retraction. 3. Slightly increasing bibasilar opacities/atelectasis with possible trace bilateral pleural effusions. Electronically Signed   By: Margarette Canada M.D.   On: 03/09/2022 14:03   DG Chest Port 1 View  Result Date: 04/01/2022 CLINICAL DATA:  Central line placement. Shortness of breath. Syncope. EXAM: PORTABLE CHEST 1 VIEW COMPARISON:  03/18/2022 FINDINGS: A new right jugular central venous catheter seen with tip overlying the mid right atrium, approximately 4 cm below the superior cavoatrial junction. No evidence of pneumothorax. Low lung volumes are again seen with mild bibasilar atelectasis. Evidence of pulmonary consolidation or pleural effusion. Heart size is within normal limits. IMPRESSION: New right jugular central venous catheter tip overlies the mid right atrium, approximately 4 cm below the superior cavoatrial junction. No evidence of pneumothorax. Low lung volumes and bibasilar  atelectasis, without significant change. Electronically Signed   By: Marlaine Hind M.D.   On: 03/27/2022 10:25   DG Chest Portable 1 View  Result Date: 03/28/2022 CLINICAL DATA:  Shortness of breath. Syncope. EXAM: PORTABLE CHEST 1 VIEW COMPARISON:  02/21/2022 FINDINGS: Normal sized heart. Poor inspiration. Minimal linear atelectasis/scarring at both lung bases. Otherwise, clear lungs with normal vascularity. Thoracic spine degenerative changes. IMPRESSION: Poor inspiration with minimal bibasilar linear atelectasis/scarring. Electronically Signed   By: Claudie Revering M.D.   On: 03/13/2022 14:01   US Paracentesis  Result Date: 03/06/2022 INDICATION: Cirrhosis, ascites EXAM: ULTRASOUND GUIDED DIAGNOSTIC AND THERAPEUTIC PARACENTESIS MEDICATIONS: None. COMPLICATIONS: None immediate. PROCEDURE: Informed written consent was obtained from the  patient after a discussion of the risks, benefits and alternatives to treatment. A timeout was performed prior to the initiation of the procedure. Initial ultrasound scanning demonstrates a large amount of ascites within the right lower abdominal quadrant. The right lower abdomen was prepped and draped in the usual sterile fashion. 1% lidocaine was used for local anesthesia. Following this, a 19 gauge, 7-cm, Yueh catheter was introduced. An ultrasound image was saved for documentation purposes. The paracentesis was performed. The catheter was removed and a dressing was applied. The patient tolerated the procedure well without immediate post procedural complication. Patient received post-procedure intravenous albumin; see nursing notes for details. FINDINGS: A total of approximately 5 of yellow fluid was removed. Samples were sent to the laboratory as requested by the clinical team. IMPRESSION: Successful ultrasound-guided paracentesis yielding 5 liters of peritoneal fluid. Electronically Signed   By: Lavonia Dana M.D.   On: 03/06/2022 14:00   IR Paracentesis  Result Date: 02/27/2022 INDICATION: Patient with a history of cirrhosis and recurrent ascites. Interventional radiology asked to perform a diagnostic and therapeutic paracentesis. EXAM: ULTRASOUND GUIDED PARACENTESIS MEDICATIONS: 1% lidocaine 10 mL COMPLICATIONS: None immediate. PROCEDURE: Informed written consent was obtained from the patient after a discussion of the risks, benefits and alternatives to treatment. A timeout was performed prior to the initiation of the procedure. Initial ultrasound scanning demonstrates a large amount of ascites within the right lower abdominal quadrant. The right lower abdomen was prepped and draped in the usual sterile fashion. 1% lidocaine was used for local anesthesia. Following this, a 19 gauge, 7-cm, Yueh catheter was introduced. An ultrasound image was saved for documentation purposes. The paracentesis was performed.  The catheter was removed and a dressing was applied. The patient tolerated the procedure well without immediate post procedural complication. Patient received post-procedure intravenous albumin; see nursing notes for details. FINDINGS: A total of approximately 5.4 L of clear yellow fluid was removed. Samples were sent to the laboratory as requested by the clinical team. IMPRESSION: Successful ultrasound-guided paracentesis yielding 5.4 liters of peritoneal fluid. Read by: Soyla Dryer, NP PLAN: The patient has previously been evaluated by the [facility], and deemed not a candidate for intervention at this time due to persistently elevated Na-MELD Electronically Signed   By: Ruthann Cancer M.D.   On: 02/27/2022 14:36   DG Swallowing Func-Speech Pathology  Result Date: 02/27/2022 Table formatting from the original result was not included. Objective Swallowing Evaluation: Type of Study: MBS-Modified Barium Swallow Study  Patient Details Name: Edward [name] MRN: [id number] Date of Birth: April 26, 1951 Today's Date: 02/27/2022 Time: SLP Start Time (ACUTE ONLY): 1237 -SLP Stop Time (ACUTE ONLY): 1255 SLP Time Calculation (min) (ACUTE ONLY): 18 min Past Medical History: Past Medical History: Diagnosis Date  Cataract   CKD (  chronic kidney disease) stage 3, GFR 30-59 ml/min (HCC)   Coronary atherosclerosis of native coronary artery   BMS LAD 2006; residual 75% distal RCA; EF 50%  Essential hypertension   Glaucoma   Low back pain   MI (myocardial infarction) ([place])   Anterior 2006  Mixed hyperlipidemia   Renal insufficiency   Type 2 diabetes mellitus ([place])  Past Surgical History: Past Surgical History: Procedure Laterality Date  CATARACT EXTRACTION, BILATERAL    CORONARY ANGIOPLASTY WITH STENT PLACEMENT  2006  ESOPHAGEAL BANDING  02/01/2022  Procedure: ESOPHAGEAL BANDING;  Surgeon: Eloise Harman, DO;  [facility];  Service: Endoscopy;;  ESOPHAGEAL BANDING   02/20/2022  Procedure: ESOPHAGEAL BANDING;  Surgeon: Ladene Artist, MD;  Location: [facility] ENDOSCOPY;  Service: Gastroenterology;;  ESOPHAGOGASTRODUODENOSCOPY (EGD) WITH PROPOFOL N/A 02/01/2022  Procedure: ESOPHAGOGASTRODUODENOSCOPY (EGD) WITH PROPOFOL;  Surgeon: Eloise Harman, DO;  Location: [facility];  Service: Endoscopy;  Laterality: N/A;  12:30 am  ESOPHAGOGASTRODUODENOSCOPY (EGD) WITH PROPOFOL N/A 02/20/2022  Procedure: ESOPHAGOGASTRODUODENOSCOPY (EGD) WITH PROPOFOL;  Surgeon: Ladene Artist, MD;  Location: [facility] ENDOSCOPY;  Service: Gastroenterology;  Laterality: N/A; HPI: Edward [name] is a 71 y.o. male who presented to [place] 10/16 with cramping of the hands and feet.  He had had a paracentesis earlier that day with 4 L removed (see below).  While in ED, he began to spit up bright red blood.  This progressed to frank hematemesis, large-volume and persistent.  He was started on Protonix, octreotide, ceftriaxone.  He then became hypotensive; therefore, massive transfusion protocol was initiated.  He received 8 units PRBC, 2 units FFP, 1 unit platelets.  He was intubated for airway protection was also started on Levophed. Pt with EGD 10/18 with banding x6. CXR 10/18 without focal infiltrate. 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  He was apparently evaluated at [facility] for transplant but was deemed to not be a candidate.  No data recorded  Recommendations for follow up therapy are one component of a multi-disciplinary discharge planning process, led by the attending physician.  Recommendations may be updated based on patient status, additional functional criteria and insurance authorization. Assessment / Plan / Recommendation   02/27/2022   1:11 PM Clinical Impressions Clinical Impression Pt presents with  a mild oropharyngeal dysphagia c/b delayed swallow initiation, reduced base of tongue retraction, incomplete laryngeal closure, and diminished sensation.  These deficits resulted in penetration of thin liquid during the swallow.  Penetration was silent, but did fully clear with subsequent swallows.  There was moderate vallecula residue with all consistencies which was also reduced with subequent swallows.  With pill simulation, there was brief stasis of tablet in vallecula.  There was esophagea stasis with table which was not fully cleared by liquid wash, but did clear with puree bolus.  Recommend advancing pt to regular texture solids as medically appropriate.  Alternate liquids and solids.  SLP Visit Diagnosis Dysphagia, oropharyngeal phase (R13.12) Impact on safety and function Mild aspiration risk     02/27/2022   1:11 PM Treatment Recommendations Treatment Recommendations No treatment recommended at this time     02/27/2022   1:15 PM Prognosis Prognosis for Safe Diet Advancement --   02/27/2022   1:11 PM Diet Recommendations SLP Diet Recommendations Thin liquid;Regular solids Liquid Administration via Straw Medication Administration Whole meds with liquid Compensations  Slow rate;Small sips/bites;Follow solids with liquid     02/27/2022   1:11 PM Other Recommendations Oral Care Recommendations Oral care BID Follow Up Recommendations No SLP follow up Assistance recommended at discharge -- Functional Status Assessment Patient has not had a recent decline in their functional status   02/23/2022  12:29 PM Frequency and Duration  Speech Therapy Frequency (ACUTE ONLY) min 2x/week Treatment Duration 2 weeks     02/27/2022   1:06 PM Oral Phase Oral - Thin Straw Premature spillage Oral - Puree WFL Oral - Regular [facility]    02/27/2022   1:09 PM Pharyngeal Phase Pharyngeal- Thin Straw Reduced tongue base retraction;Penetration/Aspiration during swallow Pharyngeal- Puree Pharyngeal residue - valleculae;Reduced tongue base  retraction;Delayed swallow initiation-vallecula Pharyngeal- Regular Reduced tongue base retraction;Pharyngeal residue - valleculae Pharyngeal- Pill Reduced tongue base retraction;Pharyngeal residue - valleculae Pharyngeal Material does not enter airway    02/27/2022   1:08 PM Cervical Esophageal Phase  Cervical Esophageal Phase Impaired Edward Savage, MA, CCC-SLP Acute Rehabilitation Services Office: [phone] 02/27/2022, 1:20 PM                     DG Chest Port 1 View  Result Date: 02/21/2022 CLINICAL DATA:  Ventilator dependent respiratory failure. EXAM: PORTABLE CHEST 1 VIEW COMPARISON:  Portable chest yesterday at 10:23 p.m. FINDINGS: 4:59 a.m. ETT tip is 3.1 cm from the carina, left IJ central line tip in the distal SVC. There are multiple overlying monitor wires. There are low lung volumes. Mild elevation right hemidiaphragm limiting visualization of the right base. There is perihilar and right basilar linear atelectasis without focal consolidation the visualized lungs. The cardiomediastinal silhouette and vasculature are normal. There is thoracic spondylosis. IMPRESSION: Limited exam with low lung volumes and elevated right hemidiaphragm. Assessment of the bases is limited. The visualized lungs without focal infiltrates. Support apparatus as above. Electronically Signed   By: Telford Nab M.D.   On: 02/21/2022 06:18   DG CHEST PORT 1 VIEW  Result Date: 02/20/2022 CLINICAL DATA:  Central line placement EXAM: PORTABLE CHEST 1 VIEW COMPARISON:  02/20/2022 FINDINGS: Left central line is been placed with the tip in the SVC. No pneumothorax. Endotracheal tube is 5 cm above the carina. Heart normal size. No confluent opacities or effusions. IMPRESSION: Support devices in expected position as above. No acute cardiopulmonary disease. Electronically Signed   By: Rolm Baptise M.D.   On: 02/20/2022 22:30   DG Chest Port 1 View  Result Date: 02/20/2022 CLINICAL DATA:  Intubation EXAM: PORTABLE CHEST 1  VIEW COMPARISON:  02/13/2022 FINDINGS: Endotracheal tube with tip just below the clavicular heads. The enteric tube at least reaches the stomach. Artifact from EKG leads. Low volume chest with streaky density at the bases suggesting atelectasis. No effusion or pneumothorax. Skin fold at the left apex. IMPRESSION: 1. Unremarkable hardware positioning. 2. Low volume chest with mild atelectasis. Electronically Signed   By: Jorje Guild M.D.   On: 02/20/2022 04:13   US Paracentesis  Result Date: 02/19/2022 INDICATION: Ascites secondary to cirrhosis EXAM: ULTRASOUND GUIDED  PARACENTESIS MEDICATIONS: None. COMPLICATIONS: None immediate. PROCEDURE: Informed written consent was obtained from the patient after a discussion of the risks, benefits and alternatives to treatment. A timeout was performed prior to the initiation of the procedure. Initial ultrasound scanning demonstrates a large amount of ascites within the right lower abdominal quadrant. The right lower abdomen was prepped and draped in the usual sterile fashion. 1% lidocaine was used for local anesthesia. Following  this, a 19 gauge, 7-cm, Yueh catheter was introduced. An ultrasound image was saved for documentation purposes. The paracentesis was performed. The catheter was removed and a dressing was applied. The patient tolerated the procedure well without immediate post procedural complication. Patient received post-procedure intravenous albumin; see nursing notes for details. FINDINGS: A total of approximately 4.0 L of serous fluid was removed. Samples were sent to the laboratory as requested by the clinical team. IMPRESSION: Successful ultrasound-guided paracentesis yielding 4.0 L of peritoneal fluid. Electronically Signed   By: Abigail Miyamoto M.D.   On: 02/19/2022 12:13   US Paracentesis  Result Date: 02/14/2022 INDICATION: Cirrhosis with recurrent ascites EXAM: ULTRASOUND GUIDED RLQ PARACENTESIS MEDICATIONS: None. COMPLICATIONS: None immediate.  PROCEDURE: Informed written consent was obtained from the patient after a discussion of the risks, benefits and alternatives to treatment. A timeout was performed prior to the initiation of the procedure. Initial ultrasound scanning demonstrates a large amount of ascites within the right lower abdominal quadrant. The right lower abdomen was prepped and draped in the usual sterile fashion. 1% lidocaine was used for local anesthesia. Following this, a 19 gauge, 7-cm, Yueh catheter was introduced. An ultrasound image was saved for documentation purposes. The paracentesis was performed. The catheter was removed and a dressing was applied. The patient tolerated the procedure well without immediate post procedural complication. Patient received post-procedure intravenous albumin; see nursing notes for details. FINDINGS: A total of approximately 3.4L of peritoneal fluid was removed. Samples were sent to the laboratory as requested by the clinical team. IMPRESSION: Successful ultrasound-guided paracentesis yielding 3.4 liters of peritoneal fluid. PLAN: The patient has required >/=2 paracenteses in a 30 day period and a formal evaluation by the [place] [facility] has been arranged. Electronically Signed   By: Lavonia Dana M.D.   On: 02/14/2022 10:56   DG Abdomen Acute W/Chest  Result Date: 02/13/2022 CLINICAL DATA:  Short of breath, abdominal pain. History of cirrhosis EXAM: DG ABDOMEN ACUTE WITH 1 VIEW CHEST COMPARISON:  Chest and abdomen 02/04/2022 FINDINGS: Heart size and vascularity normal. Mild bibasilar atelectasis. No pleural effusion Normal bowel gas pattern. No bowel obstruction or free air. Negative for urinary tract calculi. IMPRESSION: 1. Mild bibasilar atelectasis. 2. Normal bowel gas pattern. Electronically Signed   By: Franchot Gallo M.D.   On: 02/13/2022 16:21    Assessment  71 y.o. male with a history of decompensated liver cirrhosis (unclear if related to  possible Wilson's disease) complicated by recurrent variceal bleeding, ascites, encephalopathy, and thrombocytopenia, history of coronary artery disease, MI, HTN, HLD, and diabetes who presented to the hospital for recurrent UGI bleed, acute enema, and hypotension secondary to recurrent variceal bleeding.   EGD on 03/30/2022 with four columns of Grade 3 varices and stigmata of recent bleeding, stigmata of prior treatment evident (prior EGD 10/23 in [place] s/p banding), three bands successfully placed, type 2 gastroesophageal vrices without bleeding, mild portal gastropathy.   Remains intubated but without overt GI bleeding. Renal function worsening. Tbili continues to trend upwards. MELD Na was 33 yesterday; no INR today.   Patient with end-stage liver disease and actively dying. Strongly recommend comfort measures at this point. He is not a transplant or TIPS candidate.   Nursing staff with concern for administering lactulose enemas, as last attempt resulted in rectal bleeding At this point, lactulose administration will not change the trajectory he is on. I discussed with the son, Edward [name], and ex-wife, Edward [name] over the phone. Son states he does not  want to "do anything that would hurt him". I discussed that administering lactulose will not change the prognosis and also adds unnecessary discomfort. We also discussed maintaining dignity as he progresses, and they are agreeable to discontinuing lactulose enemas at this point.      Plan / Recommendations  Discontinue lactulose enemas. Son and ex-wife aware and agreeable Continue with IV antibiotics, PPI, and octreotide  Recommend comfort measures at this point. No further GI interventions available.     LOS: 3 days    03/13/2022, 8:30 AM  Annitta Needs, PhD, [facility] Gastroenterology

## 2022-03-14 DIAGNOSIS — R188 Other ascites: Secondary | ICD-10-CM | POA: Diagnosis not present

## 2022-03-14 DIAGNOSIS — J9601 Acute respiratory failure with hypoxia: Secondary | ICD-10-CM | POA: Diagnosis not present

## 2022-03-14 DIAGNOSIS — N179 Acute kidney failure, unspecified: Secondary | ICD-10-CM | POA: Diagnosis not present

## 2022-03-14 DIAGNOSIS — T17908S Unspecified foreign body in respiratory tract, part unspecified causing other injury, sequela: Secondary | ICD-10-CM | POA: Diagnosis not present

## 2022-03-14 DIAGNOSIS — K7031 Alcoholic cirrhosis of liver with ascites: Secondary | ICD-10-CM

## 2022-03-14 DIAGNOSIS — I8511 Secondary esophageal varices with bleeding: Secondary | ICD-10-CM

## 2022-03-14 DIAGNOSIS — D696 Thrombocytopenia, unspecified: Secondary | ICD-10-CM | POA: Diagnosis not present

## 2022-03-14 DIAGNOSIS — J690 Pneumonitis due to inhalation of food and vomit: Secondary | ICD-10-CM

## 2022-03-14 DIAGNOSIS — J9600 Acute respiratory failure, unspecified whether with hypoxia or hypercapnia: Secondary | ICD-10-CM | POA: Diagnosis not present

## 2022-03-14 DIAGNOSIS — Z515 Encounter for palliative care: Secondary | ICD-10-CM | POA: Diagnosis not present

## 2022-03-14 LAB — CBC
HCT: 27.4 % — ABNORMAL LOW (ref 39.0–52.0)
Hemoglobin: 9.6 g/dL — ABNORMAL LOW (ref 13.0–17.0)
MCH: 31.8 pg (ref 26.0–34.0)
MCHC: 35 g/dL (ref 30.0–36.0)
MCV: 90.7 fL (ref 80.0–100.0)
Platelets: 41 10*3/uL — ABNORMAL LOW (ref 150–400)
RBC: 3.02 MIL/uL — ABNORMAL LOW (ref 4.22–5.81)
RDW: 19.3 % — ABNORMAL HIGH (ref 11.5–15.5)
WBC: 8.2 10*3/uL (ref 4.0–10.5)
nRBC: 0 % (ref 0.0–0.2)

## 2022-03-14 LAB — BASIC METABOLIC PANEL
Anion gap: 13 (ref 5–15)
BUN: 86 mg/dL — ABNORMAL HIGH (ref 8–23)
CO2: 20 mmol/L — ABNORMAL LOW (ref 22–32)
Calcium: 8.7 mg/dL — ABNORMAL LOW (ref 8.9–10.3)
Chloride: 106 mmol/L (ref 98–111)
Creatinine, Ser: 3.67 mg/dL — ABNORMAL HIGH (ref 0.61–1.24)
GFR, Estimated: 17 mL/min — ABNORMAL LOW (ref >60)
Glucose, Bld: 188 mg/dL — ABNORMAL HIGH (ref 70–99)
Potassium: 4.1 mmol/L (ref 3.5–5.1)
Sodium: 139 mmol/L (ref 135–145)

## 2022-03-14 LAB — URINE CULTURE: Culture: >=100000 — AB

## 2022-03-14 LAB — MAGNESIUM: Magnesium: 1.9 mg/dL (ref 1.7–2.4)

## 2022-03-14 LAB — HEMOGLOBIN AND HEMATOCRIT, BLOOD
HCT: 27.1 % — ABNORMAL LOW (ref 39.0–52.0)
HCT: 28.6 % — ABNORMAL LOW (ref 39.0–52.0)
HCT: 28.5 % — ABNORMAL LOW (ref 39.0–52.0)
HCT: 29.4 % — ABNORMAL LOW (ref 39.0–52.0)
Hemoglobin: 9.4 g/dL — ABNORMAL LOW (ref 13.0–17.0)
Hemoglobin: 9.9 g/dL — ABNORMAL LOW (ref 13.0–17.0)
Hemoglobin: 9.7 g/dL — ABNORMAL LOW (ref 13.0–17.0)
Hemoglobin: 10.2 g/dL — ABNORMAL LOW (ref 13.0–17.0)

## 2022-03-14 LAB — GLUCOSE, CAPILLARY
Glucose-Capillary: 139 mg/dL — ABNORMAL HIGH (ref 70–99)
Glucose-Capillary: 139 mg/dL — ABNORMAL HIGH (ref 70–99)
Glucose-Capillary: 148 mg/dL — ABNORMAL HIGH (ref 70–99)
Glucose-Capillary: 151 mg/dL — ABNORMAL HIGH (ref 70–99)
Glucose-Capillary: 174 mg/dL — ABNORMAL HIGH (ref 70–99)
Glucose-Capillary: 176 mg/dL — ABNORMAL HIGH (ref 70–99)

## 2022-03-14 MED ORDER — PANTOPRAZOLE SODIUM 40 MG IV SOLR
40.0000 mg | Freq: Two times a day (BID) | INTRAVENOUS | Status: DC
  Administered 2022-03-14 – 2022-03-15 (×3): 40 mg via INTRAVENOUS
  Filled 2022-03-14 (×3): qty 10

## 2022-03-14 NOTE — Progress Notes (Signed)
Palliative: Edward [name] is lying quietly in bed.  He is intubated/ventilated and sedated.  There is no family at bedside at this time.  Bedside nursing staff is present attending to needs.    Conference with attending at bedside related to patient condition, needs, goals of care.  Plan: At this point continue full scope/full code.  Time for outcomes.  25 minutes Quinn Axe, NP Palliative medicine team Team phone [phone] Greater than 50% of this time was spent counseling and coordinating care related to the above assessment and plan.

## 2022-03-14 NOTE — Progress Notes (Signed)
NAME:  Edward [name], MRN:  [id number], DOB:  10/17/1950, LOS: 4 ADMISSION DATE:  04/01/2022, CONSULTATION DATE:  03/12/22  REFERRING MD:  [facility], CHIEF COMPLAINT:  acute resp failure / vent dep p EGD for varices   History of Present Illness:  13 yowm very remote smoker  with near endstage cirrhosis with ascites /varices admit 11/4 with hematemeis > banding varices 11/5 and self extubated 11/6 am and re-ET sev hours later and PCCM asked to consult with apparent aspiration injury  L >R lung    Pertinent  Medical History   54 yowm  with medical history significant of decompensated cirrhosis of the liver with ascites and esophageal varices, type 2 diabetes, stage III chronic kidney disease, coronary artery disease with stents, hypertension, history of MI, hyperlipidemia.  Patient brought to the hospital due to spitting up blood x 1.  Due to concerns, his wife called EMS and the patient was brought to the hospital for evaluation.  Patient had been hospitalized recently for extensive esophageal variceal bleeding requiring multiple units of blood transfusion and admission to the ICU.  The patient's has not had any more hematemesis or hematochezia.  He does have extensive ascites and he stated that his abdomen felt  tight.         Significant Hospital Events: Including procedures, antibiotic start and stop dates in addition to other pertinent events   EGD  11/5 > Grade 3 varices > banding and remained intubated R IJ  11/5 >>> MRSA Screen 11/5 neg  Urine culture 11/5  > 100k gnr>>>Burkholderia Sensitive to cirpo. Self extubated am 11/6 and reintubated noon 11/6    Scheduled Meds:  sodium chloride   Intravenous Once   brimonidine  1 drop Both Eyes BID   Chlorhexidine Gluconate Cloth  6 each Topical Daily   docusate  100 mg Per Tube BID   insulin aspart  0-6 Units Subcutaneous Q4H   insulin glargine-yfgn  5 Units Subcutaneous Daily   midodrine  20 mg Oral TID WC   mouth rinse  15 mL Mouth  Rinse Q2H   pantoprazole (PROTONIX) IV  40 mg Intravenous Q12H   polyethylene glycol  17 g Per Tube Daily   Continuous Infusions:  sodium chloride     dexmedetomidine (PRECEDEX) IV infusion 1 mcg/kg/hr (03/14/22 1236)   fentaNYL infusion INTRAVENOUS 200 mcg/hr (03/14/22 1236)   norepinephrine (LEVOPHED) Adult infusion 3 mcg/min (03/14/22 1236)   octreotide (SANDOSTATIN) 500 mcg in sodium chloride 0.9 % 250 mL (2 mcg/mL) infusion 50 mcg/hr (03/14/22 1236)   piperacillin-tazobactam (ZOSYN)  IV 12.5 mL/hr at 03/14/22 1236   PRN Meds:.fentaNYL (SUBLIMAZE) injection, fentaNYL (SUBLIMAZE) injection, ipratropium-albuterol, ondansetron **OR** ondansetron (ZOFRAN) IV, mouth rinse    Interim History / Subjective:  Synchronous on vent/ levo dep/ fent/precedex working well/ min levophed and peep fio2 down a bit overnight  Objective   Blood pressure (!) 123/53, pulse (!) 32, temperature 98.8 F (37.1 C), temperature source Axillary, resp. rate (!) 30, height '5\' 10"'$  (1.778 m), weight 73.4 kg, SpO2 97 %. CVP:  [4 mmHg-11 mmHg] 8 mmHg  Vent Mode: PCV FiO2 (%):  [60 %-80 %] 60 % Set Rate:  [30 bmp] 30 bmp PEEP:  [10 cmH20] 10 cmH20 Plateau Pressure:  [23 cmH20-29 cmH20] 28 cmH20   Intake/Output Summary (Last 24 hours) at 03/14/2022 1317 Last data filed at 03/14/2022 1236 Gross per 24 hour  Intake 1807.13 ml  Output 1250 ml  Net 557.13 ml   Danley Danker  Weights   03/12/22 0610 03/13/22 0623 03/14/22 0543  Weight: 70 kg 73.5 kg 73.4 kg    Examination: Tmax:  99.4 General appearance:    elderly acutely ill / sedatd on vent with fent/precedex     No jvd Oropharynx et Neck supple Lungs with coarse insp/exp rhonchi bilaterally  RRR no s3 or or sign murmur Abd mod distended/ limited excursion  Extr warm with no edema or clubbing noted Neuro  Sensorium sedated ,  no apparent motor deficits             Assessment & Plan:  1) Acute resp failure/ vent dep p EGD/self extubation with likely  significant aspiration injury in pt with near endstage Cirrhosis - Zosyn 11/6 >>>  Synchronous with vent at Vt around 520 acceptable for now  >>> PCV  target Vt around 6/7 cc/kg to avoid volutrauma/ Fio2 settings from ards protocol (the lower range one)   2) AKI superimposed on CRI with risk circulatory shock making things much worse here Lab Results  Component Value Date   CREATININE 3.67 (H) 03/14/2022   CREATININE 3.78 (H) 03/13/2022   CREATININE 3.55 (H) 03/12/2022   >>>volume expanded  to cvp 8-10 and trying to avoid /minimize  pressors /avoid nephro toxins    3) acute blood loss anemia / thrombocytopenia / coagulopathy  Lab Results  Component Value Date   HGB 9.9 (L) 03/14/2022   HGB 9.6 (L) 03/14/2022   HGB 9.4 (L) 03/14/2022   HGB 12.2 (L) 11/14/2021   HGB 12.5 (L) 08/15/2021   HGB 11.9 (L) 05/23/2021    Lab Results  Component Value Date   INR 1.6 (H) 03/12/2022   INR 2.1 (H) 03/31/2022   INR 1.5 (H) 03/29/2022    Lab Results  Component Value Date   PLT 41 (L) 03/14/2022   PLT 54 (L) 03/13/2022   PLT 62 (L) 03/12/2022   PLT 61 (LL) 11/14/2021   PLT 64 (LL) 08/15/2021   PLT 63 (LL) 05/23/2021   >>> good response to RX with restored CVP also     4) Circulatory shock likely SIRS from Aspiration or sepsis from UTI / doubt HCAP  - CVP ok,hgb ok but still on low dose levophed > wean as tol  - Urine pos Burkholderia Sensitive to cirpo > defer rx to [facility] doubt it's the cause of the sepsis picture   Labs   CBC: Recent Labs  Lab 03/25/2022 0756 03/23/2022 1400 03/12/22 0140 03/12/22 0742 03/12/22 1203 03/12/22 1910 03/13/22 0343 03/13/22 0659 03/13/22 1354 03/13/22 2013 03/14/22 0234 03/14/22 0407 03/14/22 1037  WBC 6.2  --  4.6  --  6.7  --  8.8  --   --   --   --  8.2  --   HGB 6.1*   < > 8.7*   < > 11.2*   < > 10.2*   < > 9.8* 9.7* 9.4* 9.6* 9.9*  HCT 18.8*   < > 25.2*   < > 34.1*   < > 29.2*   < > 28.5* 28.4* 27.1* 27.4* 28.6*  MCV 100.5*  --  91.3   --  96.3  --  91.5  --   --   --   --  90.7  --   PLT 56*  --  46*  --  62*  --  54*  --   --   --   --  41*  --    < > = values in  this interval not displayed.    Basic Metabolic Panel: Recent Labs  Lab 03/14/2022 1427 03/21/2022 0756 03/22/2022 1400 03/12/22 0140 03/13/22 0343 03/14/22 0407  NA 132* 134*  --  134* 136 139  K 5.5* 5.3* 4.9 4.9 4.0 4.1  CL 98 100  --  101 103 106  CO2 19* 16*  --  22 19* 20*  GLUCOSE 239* 226*  --  258* 224* 188*  BUN 46* 64*  --  69* 78* 86*  CREATININE 3.52* 3.99*  --  3.55* 3.78* 3.67*  CALCIUM 8.6* 8.6*  --  8.6* 8.6* 8.7*  MG  --   --   --   --   --  1.9   GFR: Estimated Creatinine Clearance: 19.6 mL/min (A) (by C-G formula based on SCr of 3.67 mg/dL (H)). Recent Labs  Lab 03/30/2022 1400 03/12/22 0140 03/12/22 0742 03/12/22 1203 03/13/22 0343 03/14/22 0407  PROCALCITON 0.77  --  0.64  --   --   --   WBC  --  4.6  --  6.7 8.8 8.2    Liver Function Tests: Recent Labs  Lab 03/07/2022 1427 03/17/2022 0756 03/12/22 0140 03/13/22 0343  AST 71* 42* 32 25  ALT <5 32 26 20  ALKPHOS 78 42 45 35*  BILITOT 3.5* 3.5* 9.1* 10.9*  PROT 5.9* 5.4* 5.8* 5.6*  ALBUMIN 3.1* 3.8 4.1 4.1   No results for input(s): "LIPASE", "AMYLASE" in the last 168 hours. Recent Labs  Lab 04/04/2022 1427 03/27/2022 1417 03/12/22 0140  AMMONIA 34 60* 41*    ABG    Component Value Date/Time   PHART 7.4 03/13/2022 1145   PCO2ART 34 03/13/2022 1145   PO2ART 67 (L) 03/13/2022 1145   HCO3 21.2 03/13/2022 1145   TCO2 20 (L) 02/20/2022 0638   ACIDBASEDEF 3.1 (H) 03/13/2022 1145   O2SAT 95 03/13/2022 1145     Coagulation Profile: Recent Labs  Lab 03/18/2022 1427 03/25/2022 0756 03/12/22 0140  INR 1.5* 2.1* 1.6*    Cardiac Enzymes: No results for input(s): "CKTOTAL", "CKMB", "CKMBINDEX", "TROPONINI" in the last 168 hours.  HbA1C: HB A1C (BAYER DCA - WAIVED)  Date/Time Value Ref Range Status  11/14/2021 08:18 AM 6.7 (H) 4.8 - 5.6 % Final    Comment:              Prediabetes: 5.7 - 6.4          Diabetes: >6.4          Glycemic control for adults with diabetes: <7.0   08/15/2021 09:01 AM 7.3 (H) 4.8 - 5.6 % Final    Comment:             Prediabetes: 5.7 - 6.4          Diabetes: >6.4          Glycemic control for adults with diabetes: <7.0    Hgb A1c MFr Bld  Date/Time Value Ref Range Status  03/12/2022 02:00 PM 5.3 4.8 - 5.6 % Final    Comment:    (NOTE) Pre diabetes:          5.7%-6.4%  Diabetes:              >6.4%  Glycemic control for   <7.0% adults with diabetes   11/27/2021 03:00 PM 6.5 (H) 4.8 - 5.6 % Final    Comment:    (NOTE) Pre diabetes:          5.7%-6.4%  Diabetes:              >  6.4%  Glycemic control for   <7.0% adults with diabetes     CBG: Recent Labs  Lab 03/13/22 2020 03/14/22 0005 03/14/22 0410 03/14/22 0753 03/14/22 1109  GLUCAP 177* 139* 176* 174* 139*      The patient is critically ill with multiple organ systems failure and requires high complexity decision making for assessment and support, frequent evaluation and titration of therapies, application of advanced monitoring technologies and extensive interpretation of multiple databases. Critical Care Time devoted to patient care services described in this note is 40 minutes.   Christinia Gully, MD [facility] [phone]   After 7:00 pm call Elink  [phone]

## 2022-03-14 NOTE — Progress Notes (Signed)
PROGRESS NOTE  Ramaj Frangos [facility] TKZ:[id number] DOB: 1950-12-27 DOA: 03/23/2022 PCP: [name], FNP  Brief History:  71 year old male with a history of diabetes mellitus type 2, hypertension, CKD stage III, coronary artery disease, cryptogenic liver cirrhosis with esophageal varices, coronary disease, hyperlipidemia presenting with an episode of unresponsiveness and spitting up blood.  Notably, the patient was recently hospitalized at [facility] from 02/20/2022 to 03/01/2022 due to hemorrhagic shock from bleeding esophageal varices.  The patient became hypotensive and obtunded.  He was intubated at that time for airway protection.  The patient went underwent EGD on 02/20/2022 which revealed grade 3 esophageal varices with active bleeding.  The varices were banded x6.  He was transfused 8 units PRBC and 2 units FFP.  Palliative medicine was consulted during the admission.  And full scope of care was continued.  The patient was discharged home with hemoglobin 9.5 and serum creatinine 2.26. In the ED, the patient was tachycardic with soft blood pressures with SBP in the 90s and low 100s.  WBC 6.0, hemoglobin 10.1, platelets 61,000.  Sodium 132, potassium 5.5, bicarbonate 19, serum creatinine 3.52.  EKG shows a sinus rhythm without any ST-T wave changes.  UA showed 21-50 WBC.  he went to to the hepatology service at [facility] 12/02/2021--?  Hepatorenal syndrome etc. there was question of Wilson's disease given abnormal copper testing--- he had negative ANA mitochondrial antibodies anti-smooth muscle IgG IgA IgM and negative viral hepatitis panels but has never had a liver biopsy Because of improved MELD score and lack of transportation they declined putting him on the transplant service and recommended he follow closely with hepatology at [facility] hepatology and family was given information about the same  On the evening of 03/22/2022, the patient developed hematemesis.  He became  hypotensive. On the morning of 03/14/2022, 2 units emergent PRBC and FFP were ordered.  GI was consulted to assist with management.  Patient was placed on Levophed. He underwent EGD 04/01/2022 which showed Four columns of grade III varices with stigmata of recent bleeding were found in the middle third of the esophagus and in the lower third of the esophagus.  Three bands were successfully placed with complete eradication, resulting in deflation of varices.He was left intubated after the procedure.  However, patient self-extubated on 03/12/22.  He was reintubated about 3-4 hours later.  He was started on zosyn for aspiration pneumonia.  GI and palliative continue to follow patient.  [place] discussion continue.     Assessment/Plan: Hemorrhagic shock/acute blood loss anemia -Secondary to GI bleed, likely variceal bleed versus bleeding from portal hypertensive gastropathy -3 units PRBC transfused this admission -3 units FFP ordered for transfusion this admission -2 unit platelets given -GI consulted>>EGD 11/5 -11/5 EGD>>Four columns of grade III varices with stigmata of recent bleeding.  Banding x 3 -Serial H&H -Continue ceftriaxone for SBP prophylaxis>>broaded to zosyn for aspiration -Continue octreotide -continue pantoprazole drip -Remain n.p.o. for now -discontinue midodrine as no modality to give being on vent and unable to place OG -not a candidate for TIPS  Aspiration pneumonia -remains on ventilator with FiO2 0.8 -continue IV zosyn -Follow recommendation by PCCM.  Acute respiratory failure with hypoxia -due to aspiration pneumonia and some degree of third spacing  -remains ventilator dependent -PCCM following -reintubated 3-4 hours after self extubation 03/12/22 -personally reviewed CXR 11.6>>L>R opacities -Overall prognosis is guarded. -After discussing with PCCM if family wants to continue pursuing full scope of  practice patient will benefit of transfer to critical care service at  [facility].  Acute on chronic renal failure--CKD stage IIIb -Secondary to hypotension and volume depletion -Continue holding furosemide and spironolactone temporarily -Continue IV albumin transfusion -03/30/2022 serum creatinine peaked at 3.99 -Baseline creatinine 2.2-2.4 -11/8 current Cr 3.67  UTI -UA 21-50 WBC -urine culture = >100,000 colonies of GNR -NOT CAUTI -Follow-up speciation and continue current antibiotics.   Decompensated liver cirrhosis -Not a candidate for TIPS -03/07/2022 MELD 30 -11/14/2021 hemoglobin A1c 6.7 -NovoLog sliding scale -he has been declined at [facility] hepatology for above reasons-he will need to follow-up with [facility]. -Overall prognosis is very poor; demonstrating presumably hepatorenal syndrome development.   Episode of Unresponsivness -Sedated, intubated and mechanically ventilated  -unable to properly assess responsiveness.   Coronary Artery Disease -no chest pain presently -EKG/telemetry without acute ischemic changes.   Diabetes Mellitus type 2 11/14/21 A1C--6.7 -Continue sliding scale insulin and long-acting insulin.  Goals of Care -remains full code -We discussed the patient's overall poor prognosis in the setting of his liver cirrhosis and recurrent life threatening bleed/shock and multisystem organ failure in the setting of his numerous other co-morbidities.   -Patient continued to actively declining despite full scope of treatment/intervention. -Continue to follow response.     Family Communication:   No family at bedside at time of evaluation.   Consultants:  GI, palliative   Code Status:  FULL--confirmed with patient   DVT Prophylaxis:  SCDs     Procedures: As Listed in Progress Note Above   Antibiotics: Ceftriaxone 11/4>>11/5 Zosyn 11/6>>    Subjective: Afebrile, no overt bleeding reported. Patient remains sedated, intubated and mechanically ventilated.  Objective: Vitals:   03/14/22 0802 03/14/22 0835  03/14/22 0900 03/14/22 1119  BP: (!) 131/58 (!) 125/51 (!) 121/51 (!) 125/53  Pulse: 90 (!) 35 (!) 33 (!) 32  Resp: (!) 25 (!) 30 (!) 30 (!) 30  Temp: 98.1 F (36.7 C)     TempSrc: Axillary     SpO2: 94% 99% 99% 97%  Weight:      Height:        Intake/Output Summary (Last 24 hours) at 03/14/2022 1138 Last data filed at 03/14/2022 1002 Gross per 24 hour  Intake 1936.84 ml  Output 1250 ml  Net 686.84 ml   Weight change: -0.1 kg Exam: General exam: sedated, intubated and mechanically ventilated. Not following commands currently. Respiratory system: no wheezing appreciated on exam; positive rhonchi bilaterally appreciated. Cardiovascular system:RRR. No rubs or gallops. Gastrointestinal system: Abdomen is nondistended, soft and nontender. No organomegaly or masses felt. Normal bowel sounds heard. Central nervous system: unable to properly assess given ongoing sedation. Extremities: No C/C/E,  trace edema appreciated bilaterally. Skin: No petechiae. Psychiatry: unable to assess given ongoing sedation.  Data Reviewed: I have personally reviewed following labs and imaging studies  Basic Metabolic Panel: Recent Labs  Lab 03/13/2022 1427 03/20/2022 0756 03/30/2022 1400 03/12/22 0140 03/13/22 0343 03/14/22 0407  NA 132* 134*  --  134* 136 139  K 5.5* 5.3* 4.9 4.9 4.0 4.1  CL 98 100  --  101 103 106  CO2 19* 16*  --  22 19* 20*  GLUCOSE 239* 226*  --  258* 224* 188*  BUN 46* 64*  --  69* 78* 86*  CREATININE 3.52* 3.99*  --  3.55* 3.78* 3.67*  CALCIUM 8.6* 8.6*  --  8.6* 8.6* 8.7*  MG  --   --   --   --   --  1.9   Liver Function Tests: Recent Labs  Lab 03/12/2022 1427 03/26/2022 0756 03/12/22 0140 03/13/22 0343  AST 71* 42* 32 25  ALT <5 32 26 20  ALKPHOS 78 42 45 35*  BILITOT 3.5* 3.5* 9.1* 10.9*  PROT 5.9* 5.4* 5.8* 5.6*  ALBUMIN 3.1* 3.8 4.1 4.1    Recent Labs  Lab 03/21/2022 1427 03/22/2022 1417 03/12/22 0140  AMMONIA 34 60* 41*   Coagulation Profile: Recent Labs   Lab 03/22/2022 1427 03/25/2022 0756 03/12/22 0140  INR 1.5* 2.1* 1.6*   CBC: Recent Labs  Lab 04/01/2022 0756 03/12/2022 1400 03/12/22 0140 03/12/22 0742 03/12/22 1203 03/12/22 1910 03/13/22 0343 03/13/22 0659 03/13/22 1354 03/13/22 2013 03/14/22 0234 03/14/22 0407 03/14/22 1037  WBC 6.2  --  4.6  --  6.7  --  8.8  --   --   --   --  8.2  --   HGB 6.1*   < > 8.7*   < > 11.2*   < > 10.2*   < > 9.8* 9.7* 9.4* 9.6* 9.9*  HCT 18.8*   < > 25.2*   < > 34.1*   < > 29.2*   < > 28.5* 28.4* 27.1* 27.4* 28.6*  MCV 100.5*  --  91.3  --  96.3  --  91.5  --   --   --   --  90.7  --   PLT 56*  --  46*  --  62*  --  54*  --   --   --   --  41*  --    < > = values in this interval not displayed.   CBG: Recent Labs  Lab 03/13/22 2020 03/14/22 0005 03/14/22 0410 03/14/22 0753 03/14/22 1109  GLUCAP 177* 139* 176* 174* 139*   HbA1C: Recent Labs    03/20/2022 1400  HGBA1C 5.3   Urine analysis:    Component Value Date/Time   COLORURINE YELLOW 03/12/2022 1550   APPEARANCEUR CLEAR 03/12/2022 1550   LABSPEC 1.011 03/07/2022 1550   PHURINE 5.0 03/08/2022 1550   GLUCOSEU 150 (A) 03/23/2022 1550   HGBUR NEGATIVE 03/13/2022 1550   BILIRUBINUR NEGATIVE 04/02/2022 1550   KETONESUR NEGATIVE 03/30/2022 1550   PROTEINUR NEGATIVE 03/29/2022 1550   NITRITE NEGATIVE 03/29/2022 1550   LEUKOCYTESUR SMALL (A) 03/30/2022 1550   Sepsis Labs:  Recent Results (from the past 240 hour(s))  Culture, body fluid w Gram Stain-bottle     Status: None   Collection Time: 03/06/22  1:20 PM   Specimen: Peritoneal Washings  Result Value Ref Range Status   Specimen Description PERITONEAL  Final   Special Requests BAA 10CC  Final   Culture   Final    NO GROWTH 5 DAYS Performed at [facility], [address] [place], [place] [address]    Report Status 04/02/2022 FINAL  Final  Gram stain     Status: None   Collection Time: 03/06/22  1:20 PM   Specimen: Peritoneal Washings  Result Value Ref Range Status    Specimen Description PERITONEAL  Final   Special Requests NONE  Final   Gram Stain   Final    NO ORGANISMS SEEN WBC PRESENT,BOTH PMN AND MONONUCLEAR CYTOSPIN SMEAR Performed at [facility], [address], [place], [place] [address]    Report Status 03/06/2022 FINAL  Final  MRSA Next Gen by PCR, Nasal     Status: None   Collection Time: 03/31/2022 12:29 PM   Specimen: Nasal Mucosa; Nasal Swab  Result Value Ref Range Status   MRSA by PCR Next Gen NOT DETECTED NOT DETECTED Final    Comment: (NOTE) The GeneXpert MRSA Assay (FDA approved for NASAL specimens only), is one component of a comprehensive MRSA colonization surveillance program. It is not intended to diagnose MRSA infection nor to guide or monitor treatment for MRSA infections. Test performance is not FDA approved in patients less than 85 years old. Performed at [facility], [address] [place], [place] [address]   Urine Culture     Status: Abnormal (Preliminary result)   Collection Time: 03/23/2022  3:50 PM   Specimen: Urine, Catheterized  Result Value Ref Range Status   Specimen Description   Final    URINE, CATHETERIZED Performed at [facility], [address], [place], [place] [address]    Special Requests   Final    NONE Performed at [facility], [address] [place], [place] [address]    Culture (A)  Final    >=100,000 COLONIES/mL GRAM NEGATIVE RODS IDENTIFICATION AND SUSCEPTIBILITIES TO FOLLOW Performed at [facility], [place] [address] [place], [place] [address]    Report Status PENDING  Incomplete     Scheduled Meds:  sodium chloride   Intravenous Once   brimonidine  1 drop Both Eyes BID   Chlorhexidine Gluconate Cloth  6 each Topical Daily   docusate  100 mg Per Tube BID   insulin aspart  0-6 Units Subcutaneous Q4H   insulin glargine-yfgn  5 Units Subcutaneous Daily   midodrine  20 mg Oral TID WC   mouth rinse  15 mL Mouth Rinse Q2H   pantoprazole (PROTONIX) IV  40 mg Intravenous Q12H    polyethylene glycol  17 g Per Tube Daily   Continuous Infusions:  sodium chloride     dexmedetomidine (PRECEDEX) IV infusion 1 mcg/kg/hr (03/14/22 0813)   fentaNYL infusion INTRAVENOUS 200 mcg/hr (03/14/22 0719)   norepinephrine (LEVOPHED) Adult infusion 5 mcg/min (03/14/22 0233)   octreotide (SANDOSTATIN) 500 mcg in sodium chloride 0.9 % 250 mL (2 mcg/mL) infusion 50 mcg/hr (03/14/22 0722)   piperacillin-tazobactam (ZOSYN)  IV 3.375 g (03/13/22 2322)    Procedures/Studies: DG Chest Port 1 View  Result Date: 03/13/2022 CLINICAL DATA:  Acute respiratory failure with hypoxemia. EXAM: PORTABLE CHEST 1 VIEW COMPARISON:  March 12, 2022. FINDINGS: The heart size and mediastinal contours are within normal limits. Endotracheal and right internal jugular catheters are unchanged in position. Stable bilateral lung opacities are noted concerning for multifocal pneumonia or edema. The visualized skeletal structures are unremarkable. IMPRESSION: Stable support apparatus. Stable bilateral lung opacities as described above. Electronically Signed   By: Marijo Conception M.D.   On: 03/13/2022 10:17   DG CHEST PORT 1 VIEW  Result Date: 03/12/2022 CLINICAL DATA:  Endotracheal tube placement. EXAM: PORTABLE CHEST 1 VIEW COMPARISON:  Same day. FINDINGS: Endotracheal tube is in grossly good position. Right internal jugular catheter is unchanged. Significantly increased bilateral lung opacities are noted concerning for pneumonia or possibly edema. Small bilateral pleural effusions are most likely present. Bony thorax is unremarkable. IMPRESSION: Endotracheal tube in grossly good position. Significantly increased bilateral lung opacities as described above. Electronically Signed   By: Marijo Conception M.D.   On: 03/12/2022 12:21   DG CHEST PORT 1 VIEW  Result Date: 03/12/2022 CLINICAL DATA:  Respiratory distress EXAM: PORTABLE CHEST 1 VIEW COMPARISON:  Previous studies including the examination done earlier today  FINDINGS: Transverse diameter of heart is in the upper  limits of normal. Diffuse alveolar densities are seen in left parahilar region and left lower lung field. Increased interstitial markings are seen in right parahilar region. No significant interval changes are noted in comparison with the immediate previous study. Right lateral CP angle is clear. There is minimal blunting of left lateral CP angle. There is no pneumothorax. Faint opacity seen in the right apex may be an artifact such as skin fold. Tip of right IJ central venous catheter is seen at the junction of superior vena cava and right atrium. IMPRESSION: Extensive interstitial and alveolar densities are seen in both lungs, more so on the left side with no significant interval change. Findings suggest multifocal pneumonia. Part of this finding may be due to pulmonary edema. Small left pleural effusion. Electronically Signed   By: Elmer Picker M.D.   On: 03/12/2022 08:00   DG CHEST PORT 1 VIEW  Result Date: 03/12/2022 CLINICAL DATA:  Acute respiratory failure with hypoxia EXAM: PORTABLE CHEST 1 VIEW COMPARISON:  03/17/2022 FINDINGS: Endotracheal tube tip 3 cm above the carina. Central line tip at the [facility] RA junction. Worsening of infiltrate in the perihilar lung bilaterally, worse on the left than the right. This is presumed represent pneumonia, though an element of edema could be present. IMPRESSION: Worsening of perihilar infiltrate, worse on the left than the right. This is presumed to represent pneumonia, though an element of edema could be present. Electronically Signed   By: Nelson Chimes M.D.   On: 03/12/2022 07:19   DG CHEST PORT 1 VIEW  Result Date: 03/31/2022 CLINICAL DATA:  Status post retraction of a right jugular catheter. EXAM: PORTABLE CHEST 1 VIEW COMPARISON:  Earlier today. FINDINGS: The right jugular catheter has been retracted with its tip at the superior cavoatrial junction. The endotracheal tube is unchanged. Bilateral  linear densities, mildly increased on the left and mildly improved on the right. This includes a persistent curvy linear density at the right lung base, simulating pneumoperitoneum. Thoracic spine degenerative changes. IMPRESSION: 1. Right jugular catheter tip at the superior cavoatrial junction. 2. Bilateral atelectasis, mildly increased on the left and mildly improved on the right. Electronically Signed   By: Claudie Revering M.D.   On: 03/19/2022 16:23   DG Chest Port 1 View  Result Date: 03/12/2022 CLINICAL DATA:  Respiratory failure.  Intubation. EXAM: PORTABLE CHEST 1 VIEW COMPARISON:  03/31/2022 and prior studies FINDINGS: Endotracheal tube is now noted with tip 3.5 cm above the carina. RIGHT IJ central venous catheter with tip overlying the RIGHT atrium again noted. This is a low volume study with slightly increasing bibasilar opacities/atelectasis. There is no evidence of pneumothorax. There may be trace bilateral pleural effusions present. No other significant changes noted. IMPRESSION: 1. Endotracheal tube with tip 3.5 cm above the carina. 2. RIGHT IJ central venous catheter with tip overlying the RIGHT atrium, consider 3-4 cm retraction. 3. Slightly increasing bibasilar opacities/atelectasis with possible trace bilateral pleural effusions. Electronically Signed   By: Margarette Canada M.D.   On: 03/27/2022 14:03   DG Chest Port 1 View  Result Date: 03/12/2022 CLINICAL DATA:  Central line placement. Shortness of breath. Syncope. EXAM: PORTABLE CHEST 1 VIEW COMPARISON:  04/02/2022 FINDINGS: A new right jugular central venous catheter seen with tip overlying the mid right atrium, approximately 4 cm below the superior cavoatrial junction. No evidence of pneumothorax. Low lung volumes are again seen with mild bibasilar atelectasis. Evidence of pulmonary consolidation or pleural effusion. Heart size is within normal limits. IMPRESSION:  New right jugular central venous catheter tip overlies the mid right atrium,  approximately 4 cm below the superior cavoatrial junction. No evidence of pneumothorax. Low lung volumes and bibasilar atelectasis, without significant change. Electronically Signed   By: Marlaine Hind M.D.   On: 03/23/2022 10:25   DG Chest Portable 1 View  Result Date: 03/13/2022 CLINICAL DATA:  Shortness of breath. Syncope. EXAM: PORTABLE CHEST 1 VIEW COMPARISON:  02/21/2022 FINDINGS: Normal sized heart. Poor inspiration. Minimal linear atelectasis/scarring at both lung bases. Otherwise, clear lungs with normal vascularity. Thoracic spine degenerative changes. IMPRESSION: Poor inspiration with minimal bibasilar linear atelectasis/scarring. Electronically Signed   By: Claudie Revering M.D.   On: 03/22/2022 14:01   US Paracentesis  Result Date: 03/06/2022 INDICATION: Cirrhosis, ascites EXAM: ULTRASOUND GUIDED DIAGNOSTIC AND THERAPEUTIC PARACENTESIS MEDICATIONS: None. COMPLICATIONS: None immediate. PROCEDURE: Informed written consent was obtained from the patient after a discussion of the risks, benefits and alternatives to treatment. A timeout was performed prior to the initiation of the procedure. Initial ultrasound scanning demonstrates a large amount of ascites within the right lower abdominal quadrant. The right lower abdomen was prepped and draped in the usual sterile fashion. 1% lidocaine was used for local anesthesia. Following this, a 19 gauge, 7-cm, Yueh catheter was introduced. An ultrasound image was saved for documentation purposes. The paracentesis was performed. The catheter was removed and a dressing was applied. The patient tolerated the procedure well without immediate post procedural complication. Patient received post-procedure intravenous albumin; see nursing notes for details. FINDINGS: A total of approximately 5 of yellow fluid was removed. Samples were sent to the laboratory as requested by the clinical team. IMPRESSION: Successful ultrasound-guided paracentesis yielding 5 liters of  peritoneal fluid. Electronically Signed   By: Lavonia Dana M.D.   On: 03/06/2022 14:00   IR Paracentesis  Result Date: 02/27/2022 INDICATION: Patient with a history of cirrhosis and recurrent ascites. Interventional radiology asked to perform a diagnostic and therapeutic paracentesis. EXAM: ULTRASOUND GUIDED PARACENTESIS MEDICATIONS: 1% lidocaine 10 mL COMPLICATIONS: None immediate. PROCEDURE: Informed written consent was obtained from the patient after a discussion of the risks, benefits and alternatives to treatment. A timeout was performed prior to the initiation of the procedure. Initial ultrasound scanning demonstrates a large amount of ascites within the right lower abdominal quadrant. The right lower abdomen was prepped and draped in the usual sterile fashion. 1% lidocaine was used for local anesthesia. Following this, a 19 gauge, 7-cm, Yueh catheter was introduced. An ultrasound image was saved for documentation purposes. The paracentesis was performed. The catheter was removed and a dressing was applied. The patient tolerated the procedure well without immediate post procedural complication. Patient received post-procedure intravenous albumin; see nursing notes for details. FINDINGS: A total of approximately 5.4 L of clear yellow fluid was removed. Samples were sent to the laboratory as requested by the clinical team. IMPRESSION: Successful ultrasound-guided paracentesis yielding 5.4 liters of peritoneal fluid. Read by: Soyla Dryer, NP PLAN: The patient has previously been evaluated by the [facility] Interventional Radiology Portal Hypertension [facility], and deemed not a candidate for intervention at this time due to persistently elevated Na-MELD Electronically Signed   By: Ruthann Cancer M.D.   On: 02/27/2022 14:36   DG Swallowing Func-Speech Pathology  Result Date: 02/27/2022 Table formatting from the original result was not included. Objective Swallowing Evaluation: Type of Study: MBS-Modified  Barium Swallow Study  Patient Details Name: DAKWAN PRIDGEN MRN: [id number] Date of Birth: 05/03/1951 Today's Date: 02/27/2022 Time: SLP Start Time (  ACUTE ONLY): 1237 -SLP Stop Time (ACUTE ONLY): 6237 SLP Time Calculation (min) (ACUTE ONLY): 18 min Past Medical History: Past Medical History: Diagnosis Date  Cataract   CKD (chronic kidney disease) stage 3, GFR 30-59 ml/min (HCC)   Coronary atherosclerosis of native coronary artery   BMS LAD 2006; residual 75% distal RCA; EF 50%  Essential hypertension   Glaucoma   Low back pain   MI (myocardial infarction) ([place])   Anterior 2006  Mixed hyperlipidemia   Renal insufficiency   Type 2 diabetes mellitus ([place])  Past Surgical History: Past Surgical History: Procedure Laterality Date  CATARACT EXTRACTION, BILATERAL    CORONARY ANGIOPLASTY WITH STENT PLACEMENT  2006  ESOPHAGEAL BANDING  02/01/2022  Procedure: ESOPHAGEAL BANDING;  Surgeon: Eloise Harman, DO;  Location: [facility]O [facility];  Service: Endoscopy;;  ESOPHAGEAL BANDING  02/20/2022  Procedure: ESOPHAGEAL BANDING;  Surgeon: Ladene Artist, MD;  Location[facility] ENDOSCOPY;  Service: Gastroenterology;;  ESOPHAGOGASTRODUODENOSCOPY (EGD) WITH PROPOFOL N/A 02/01/2022  Procedure: ESOPHAGOGASTRODUODENOSCOPY (EGD) WITH PROPOFOL;  Surgeon: Eloise Harman, DO;  Location: [facility] ENDO SUITE;  Service: Endoscopy;  Laterality: N/A;  12:30 am  ESOPHAGOGASTRODUODENOSCOPY (EGD) WITH PROPOFOL N/A 02/20/2022  Procedure: ESOPHAGOGASTRODUODENOSCOPY (EGD) WITH PROPOFOL;  Surgeon: Ladene Artist, MD;  Location: [facility] ENDOSCOPY;  Service: Gastroenterology;  Laterality: N/A; HPI: Edward [name] is a 71 y.o. male who presented to [place] 10/16 with cramping of the hands and feet.  He had had a paracentesis earlier that day with 4 L removed (see below).  While in ED, he began to spit up bright red blood.  This progressed to frank hematemesis, large-volume and persistent.  He was started on Protonix, octreotide, ceftriaxone.  He then became hypotensive;  therefore, massive transfusion protocol was initiated.  He received 8 units PRBC, 2 units FFP, 1 unit platelets.  He was intubated for airway protection was also started on Levophed. Pt with EGD 10/18 with banding x6. CXR 10/18 without focal infiltrate. 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  He was apparently evaluated at [facility] for transplant but was deemed to not be a candidate.  No data recorded  Recommendations for follow up therapy are one component of a multi-disciplinary discharge planning process, led by the attending physician.  Recommendations may be updated based on patient status, additional functional criteria and insurance authorization. Assessment / Plan / Recommendation   02/27/2022   1:11 PM Clinical Impressions Clinical Impression Pt presents with a mild oropharyngeal dysphagia c/b delayed swallow initiation, reduced base of tongue retraction, incomplete laryngeal closure, and diminished sensation.  These deficits resulted in penetration of thin liquid during the swallow.  Penetration was silent, but did fully clear with subsequent swallows.  There was moderate vallecula residue with all consistencies which was also reduced with subequent swallows.  With pill simulation, there was brief stasis of tablet in vallecula.  There was esophagea stasis with table which was not fully cleared by liquid wash, but did clear with puree bolus.  Recommend advancing pt to regular texture solids as medically appropriate.  Alternate liquids and solids.  SLP Visit Diagnosis Dysphagia, oropharyngeal phase (R13.12) Impact on safety and function Mild aspiration risk     02/27/2022   1:11 PM Treatment Recommendations Treatment Recommendations No treatment recommended at this time     02/27/2022   1:15 PM Prognosis  Prognosis for  Safe Diet Advancement --   02/27/2022   1:11 PM Diet Recommendations SLP Diet Recommendations Thin liquid;Regular solids Liquid Administration via Straw Medication Administration Whole meds with liquid Compensations Slow rate;Small sips/bites;Follow solids with liquid     02/27/2022   1:11 PM Other Recommendations Oral Care Recommendations Oral care BID Follow Up Recommendations No SLP follow up Assistance recommended at discharge -- Functional Status Assessment Patient has not had a recent decline in their functional status   02/23/2022  12:29 PM Frequency and Duration  Speech Therapy Frequency (ACUTE ONLY) min 2x/week Treatment Duration 2 weeks     02/27/2022   1:06 PM Oral Phase Oral - Thin Straw Premature spillage Oral - Puree WFL Oral - Regular [facility]    02/27/2022   1:09 PM Pharyngeal Phase Pharyngeal- Thin Straw Reduced tongue base retraction;Penetration/Aspiration during swallow Pharyngeal- Puree Pharyngeal residue - valleculae;Reduced tongue base retraction;Delayed swallow initiation-vallecula Pharyngeal- Regular Reduced tongue base retraction;Pharyngeal residue - valleculae Pharyngeal- Pill Reduced tongue base retraction;Pharyngeal residue - valleculae Pharyngeal Material does not enter airway    02/27/2022   1:08 PM Cervical Esophageal Phase  Cervical Esophageal Phase Impaired Celedonio Savage, MA, CCC-SLP Acute Rehabilitation Services Office: [phone] 02/27/2022, 1:20 PM                     DG Chest Port 1 View  Result Date: 02/21/2022 CLINICAL DATA:  Ventilator dependent respiratory failure. EXAM: PORTABLE CHEST 1 VIEW COMPARISON:  Portable chest yesterday at 10:23 p.m. FINDINGS: 4:59 a.m. ETT tip is 3.1 cm from the carina, left IJ central line tip in the distal SVC. There are multiple overlying monitor wires. There are low lung volumes. Mild elevation right hemidiaphragm limiting visualization of the right base. There is perihilar and right basilar linear atelectasis without focal  consolidation the visualized lungs. The cardiomediastinal silhouette and vasculature are normal. There is thoracic spondylosis. IMPRESSION: Limited exam with low lung volumes and elevated right hemidiaphragm. Assessment of the bases is limited. The visualized lungs without focal infiltrates. Support apparatus as above. Electronically Signed   By: Telford Nab M.D.   On: 02/21/2022 06:18   DG CHEST PORT 1 VIEW  Result Date: 02/20/2022 CLINICAL DATA:  Central line placement EXAM: PORTABLE CHEST 1 VIEW COMPARISON:  02/20/2022 FINDINGS: Left central line is been placed with the tip in the SVC. No pneumothorax. Endotracheal tube is 5 cm above the carina. Heart normal size. No confluent opacities or effusions. IMPRESSION: Support devices in expected position as above. No acute cardiopulmonary disease. Electronically Signed   By: Rolm Baptise M.D.   On: 02/20/2022 22:30   DG Chest Port 1 View  Result Date: 02/20/2022 CLINICAL DATA:  Intubation EXAM: PORTABLE CHEST 1 VIEW COMPARISON:  02/13/2022 FINDINGS: Endotracheal tube with tip just below the clavicular heads. The enteric tube at least reaches the stomach. Artifact from EKG leads. Low volume chest with streaky density at the bases suggesting atelectasis. No effusion or pneumothorax. Skin fold at the left apex. IMPRESSION: 1. Unremarkable hardware positioning. 2. Low volume chest with mild atelectasis. Electronically Signed   By: Jorje Guild M.D.   On: 02/20/2022 04:13   US Paracentesis  Result Date: 02/19/2022 INDICATION: Ascites secondary to cirrhosis EXAM: ULTRASOUND GUIDED  PARACENTESIS MEDICATIONS: None. COMPLICATIONS: None immediate. PROCEDURE: Informed written consent was obtained from the patient after a discussion of the risks, benefits and alternatives to treatment. A timeout was performed prior to the initiation of the procedure. Initial ultrasound scanning demonstrates a large amount  of ascites within the right lower abdominal quadrant.  The right lower abdomen was prepped and draped in the usual sterile fashion. 1% lidocaine was used for local anesthesia. Following this, a 19 gauge, 7-cm, Yueh catheter was introduced. An ultrasound image was saved for documentation purposes. The paracentesis was performed. The catheter was removed and a dressing was applied. The patient tolerated the procedure well without immediate post procedural complication. Patient received post-procedure intravenous albumin; see nursing notes for details. FINDINGS: A total of approximately 4.0 L of serous fluid was removed. Samples were sent to the laboratory as requested by the clinical team. IMPRESSION: Successful ultrasound-guided paracentesis yielding 4.0 L of peritoneal fluid. Electronically Signed   By: Abigail Miyamoto M.D.   On: 02/19/2022 12:13   US Paracentesis  Result Date: 02/14/2022 INDICATION: Cirrhosis with recurrent ascites EXAM: ULTRASOUND GUIDED RLQ PARACENTESIS MEDICATIONS: None. COMPLICATIONS: None immediate. PROCEDURE: Informed written consent was obtained from the patient after a discussion of the risks, benefits and alternatives to treatment. A timeout was performed prior to the initiation of the procedure. Initial ultrasound scanning demonstrates a large amount of ascites within the right lower abdominal quadrant. The right lower abdomen was prepped and draped in the usual sterile fashion. 1% lidocaine was used for local anesthesia. Following this, a 19 gauge, 7-cm, Yueh catheter was introduced. An ultrasound image was saved for documentation purposes. The paracentesis was performed. The catheter was removed and a dressing was applied. The patient tolerated the procedure well without immediate post procedural complication. Patient received post-procedure intravenous albumin; see nursing notes for details. FINDINGS: A total of approximately 3.4L of peritoneal fluid was removed. Samples were sent to the laboratory as requested by the clinical team.  IMPRESSION: Successful ultrasound-guided paracentesis yielding 3.4 liters of peritoneal fluid. PLAN: The patient has required >/=2 paracenteses in a 30 day period and a formal evaluation by the [facility] has been arranged. Electronically Signed   By: Lavonia Dana M.D.   On: 02/14/2022 10:56   DG Abdomen Acute W/Chest  Result Date: 02/13/2022 CLINICAL DATA:  Short of breath, abdominal pain. History of cirrhosis EXAM: DG ABDOMEN ACUTE WITH 1 VIEW CHEST COMPARISON:  Chest and abdomen 02/04/2022 FINDINGS: Heart size and vascularity normal. Mild bibasilar atelectasis. No pleural effusion Normal bowel gas pattern. No bowel obstruction or free air. Negative for urinary tract calculi. IMPRESSION: 1. Mild bibasilar atelectasis. 2. Normal bowel gas pattern. Electronically Signed   By: Franchot Gallo M.D.   On: 02/13/2022 16:21    Barton Dubois, MD  [facility]  If 7PM-7AM, please contact night-coverage [email or web address] Password [email or web address]  03/14/2022, 11:38 AM   LOS: 4 days

## 2022-03-15 DIAGNOSIS — K767 Hepatorenal syndrome: Secondary | ICD-10-CM

## 2022-03-15 DIAGNOSIS — J690 Pneumonitis due to inhalation of food and vomit: Secondary | ICD-10-CM | POA: Diagnosis not present

## 2022-03-15 DIAGNOSIS — Z7189 Other specified counseling: Secondary | ICD-10-CM | POA: Diagnosis not present

## 2022-03-15 DIAGNOSIS — Z515 Encounter for palliative care: Secondary | ICD-10-CM | POA: Diagnosis not present

## 2022-03-15 DIAGNOSIS — N179 Acute kidney failure, unspecified: Secondary | ICD-10-CM | POA: Diagnosis not present

## 2022-03-15 DIAGNOSIS — I8501 Esophageal varices with bleeding: Secondary | ICD-10-CM

## 2022-03-15 DIAGNOSIS — K7460 Unspecified cirrhosis of liver: Secondary | ICD-10-CM | POA: Diagnosis not present

## 2022-03-15 DIAGNOSIS — A419 Sepsis, unspecified organism: Secondary | ICD-10-CM

## 2022-03-15 DIAGNOSIS — K7210 Chronic hepatic failure without coma: Secondary | ICD-10-CM

## 2022-03-15 DIAGNOSIS — R188 Other ascites: Secondary | ICD-10-CM | POA: Diagnosis not present

## 2022-03-15 DIAGNOSIS — Z9911 Dependence on respirator [ventilator] status: Secondary | ICD-10-CM

## 2022-03-15 DIAGNOSIS — R6521 Severe sepsis with septic shock: Secondary | ICD-10-CM

## 2022-03-15 DIAGNOSIS — J9600 Acute respiratory failure, unspecified whether with hypoxia or hypercapnia: Secondary | ICD-10-CM | POA: Diagnosis not present

## 2022-03-15 LAB — BPAM RBC
Blood Product Expiration Date: 202311082359
Blood Product Expiration Date: 202311162359
Blood Product Expiration Date: 202311272359
Blood Product Expiration Date: 202311272359
Blood Product Expiration Date: 202311302359
Blood Product Expiration Date: 202312072359
ISSUE DATE / TIME: 202311050731
ISSUE DATE / TIME: 202311050849
ISSUE DATE / TIME: 202311051029
ISSUE DATE / TIME: 202311051029
ISSUE DATE / TIME: 202311061230
Unit Type and Rh: 1700
Unit Type and Rh: 600
Unit Type and Rh: 6200
Unit Type and Rh: 6200
Unit Type and Rh: 6200
Unit Type and Rh: 6200

## 2022-03-15 LAB — TROPONIN I (HIGH SENSITIVITY)
Troponin I (High Sensitivity): 937 ng/L (ref <18)
Troponin I (High Sensitivity): 850 ng/L (ref <18)

## 2022-03-15 LAB — TYPE AND SCREEN
ABO/RH(D): AB POS
Antibody Screen: NEGATIVE
Unit division: 0
Unit division: 0
Unit division: 0
Unit division: 0
Unit division: 0
Unit division: 0

## 2022-03-15 LAB — COMPREHENSIVE METABOLIC PANEL
ALT: 27 U/L (ref 0–44)
AST: 37 U/L (ref 15–41)
Albumin: 3.2 g/dL — ABNORMAL LOW (ref 3.5–5.0)
Alkaline Phosphatase: 37 U/L — ABNORMAL LOW (ref 38–126)
Anion gap: 13 (ref 5–15)
BUN: 92 mg/dL — ABNORMAL HIGH (ref 8–23)
CO2: 19 mmol/L — ABNORMAL LOW (ref 22–32)
Calcium: 8.7 mg/dL — ABNORMAL LOW (ref 8.9–10.3)
Chloride: 109 mmol/L (ref 98–111)
Creatinine, Ser: 3.77 mg/dL — ABNORMAL HIGH (ref 0.61–1.24)
GFR, Estimated: 17 mL/min — ABNORMAL LOW (ref >60)
Glucose, Bld: 153 mg/dL — ABNORMAL HIGH (ref 70–99)
Potassium: 3.5 mmol/L (ref 3.5–5.1)
Sodium: 141 mmol/L (ref 135–145)
Total Bilirubin: 14 mg/dL — ABNORMAL HIGH (ref 0.3–1.2)
Total Protein: 5.4 g/dL — ABNORMAL LOW (ref 6.5–8.1)

## 2022-03-15 LAB — HEMOGLOBIN AND HEMATOCRIT, BLOOD
HCT: 28.5 % — ABNORMAL LOW (ref 39.0–52.0)
HCT: 32.4 % — ABNORMAL LOW (ref 39.0–52.0)
HCT: 32.2 % — ABNORMAL LOW (ref 39.0–52.0)
Hemoglobin: 10.1 g/dL — ABNORMAL LOW (ref 13.0–17.0)
Hemoglobin: 11.2 g/dL — ABNORMAL LOW (ref 13.0–17.0)
Hemoglobin: 10.6 g/dL — ABNORMAL LOW (ref 13.0–17.0)

## 2022-03-15 LAB — BLOOD GAS, ARTERIAL
Acid-base deficit: 7.9 mmol/L — ABNORMAL HIGH (ref 0.0–2.0)
Bicarbonate: 16.4 mmol/L — ABNORMAL LOW (ref 20.0–28.0)
Drawn by: 35043
FIO2: 70 %
O2 Saturation: 98.8 %
Patient temperature: 36.9
pCO2 arterial: 29 mmHg — ABNORMAL LOW (ref 32–48)
pH, Arterial: 7.36 (ref 7.35–7.45)
pO2, Arterial: 100 mmHg (ref 83–108)

## 2022-03-15 LAB — BASIC METABOLIC PANEL
Anion gap: 18 — ABNORMAL HIGH (ref 5–15)
BUN: 93 mg/dL — ABNORMAL HIGH (ref 8–23)
CO2: 16 mmol/L — ABNORMAL LOW (ref 22–32)
Calcium: 8.7 mg/dL — ABNORMAL LOW (ref 8.9–10.3)
Chloride: 112 mmol/L — ABNORMAL HIGH (ref 98–111)
Creatinine, Ser: 3.89 mg/dL — ABNORMAL HIGH (ref 0.61–1.24)
GFR, Estimated: 16 mL/min — ABNORMAL LOW (ref >60)
Glucose, Bld: 152 mg/dL — ABNORMAL HIGH (ref 70–99)
Potassium: 3.3 mmol/L — ABNORMAL LOW (ref 3.5–5.1)
Sodium: 146 mmol/L — ABNORMAL HIGH (ref 135–145)

## 2022-03-15 LAB — GLUCOSE, CAPILLARY
Glucose-Capillary: 134 mg/dL — ABNORMAL HIGH (ref 70–99)
Glucose-Capillary: 151 mg/dL — ABNORMAL HIGH (ref 70–99)
Glucose-Capillary: 151 mg/dL — ABNORMAL HIGH (ref 70–99)
Glucose-Capillary: 154 mg/dL — ABNORMAL HIGH (ref 70–99)

## 2022-03-15 LAB — MAGNESIUM: Magnesium: 2 mg/dL (ref 1.7–2.4)

## 2022-03-15 MED ORDER — GLYCOPYRROLATE 0.2 MG/ML IJ SOLN: 0.2000 mg | INTRAMUSCULAR | Status: DC | PRN

## 2022-03-15 MED ORDER — ONDANSETRON HCL 4 MG/2ML IJ SOLN: 4.0000 mg | Freq: Four times a day (QID) | INTRAMUSCULAR | Status: DC | PRN

## 2022-03-15 MED ORDER — BIOTENE DRY MOUTH MT LIQD: 15.0000 mL | OROMUCOSAL | Status: DC | PRN

## 2022-03-15 MED ORDER — GLYCOPYRROLATE 0.2 MG/ML IJ SOLN
0.2000 mg | INTRAMUSCULAR | Status: DC | PRN
  Administered 2022-03-15: 0.2 mg via INTRAVENOUS
  Filled 2022-03-15: qty 1

## 2022-03-15 MED ORDER — LORAZEPAM 1 MG PO TABS: 1.0000 mg | ORAL_TABLET | ORAL | Status: DC | PRN

## 2022-03-15 MED ORDER — GLYCOPYRROLATE 1 MG PO TABS: 1.0000 mg | ORAL_TABLET | ORAL | Status: DC | PRN

## 2022-03-15 MED ORDER — SODIUM BICARBONATE 8.4 % IV SOLN
100.0000 meq | Freq: Once | INTRAVENOUS | Status: AC
  Administered 2022-03-15: 100 meq via INTRAVENOUS

## 2022-03-15 MED ORDER — SODIUM BICARBONATE 8.4 % IV SOLN
INTRAVENOUS | Status: AC
  Filled 2022-03-15: qty 100

## 2022-03-15 MED ORDER — HALOPERIDOL LACTATE 5 MG/ML IJ SOLN: 0.5000 mg | INTRAMUSCULAR | Status: DC | PRN

## 2022-03-15 MED ORDER — DEXMEDETOMIDINE HCL IN NACL 400 MCG/100ML IV SOLN
0.4000 ug/kg/h | INTRAVENOUS | Status: DC
  Administered 2022-03-15: 1.2 ug/kg/h via INTRAVENOUS
  Filled 2022-03-15: qty 100

## 2022-03-15 MED ORDER — ACETAMINOPHEN 325 MG PO TABS: 650.0000 mg | ORAL_TABLET | Freq: Four times a day (QID) | ORAL | Status: DC | PRN

## 2022-03-15 MED ORDER — SODIUM CHLORIDE 0.9 % IV BOLUS
500.0000 mL | Freq: Once | INTRAVENOUS | Status: AC
  Administered 2022-03-15: 500 mL via INTRAVENOUS

## 2022-03-15 MED ORDER — ACETAMINOPHEN 650 MG RE SUPP: 650.0000 mg | Freq: Four times a day (QID) | RECTAL | Status: DC | PRN

## 2022-03-15 MED ORDER — HALOPERIDOL 0.5 MG PO TABS: 0.5000 mg | ORAL_TABLET | ORAL | Status: DC | PRN

## 2022-03-15 MED ORDER — ONDANSETRON 4 MG PO TBDP: 4.0000 mg | ORAL_TABLET | Freq: Four times a day (QID) | ORAL | Status: DC | PRN

## 2022-03-15 MED ORDER — POLYVINYL ALCOHOL 1.4 % OP SOLN: 1.0000 [drp] | Freq: Four times a day (QID) | OPHTHALMIC | Status: DC | PRN

## 2022-03-15 MED ORDER — LORAZEPAM 2 MG/ML IJ SOLN
1.0000 mg | INTRAMUSCULAR | Status: DC | PRN
  Administered 2022-03-15: 2 mg via INTRAVENOUS
  Filled 2022-03-15: qty 1

## 2022-03-15 MED ORDER — LORAZEPAM 2 MG/ML IJ SOLN: 1.0000 mg | INTRAMUSCULAR | Status: DC | PRN

## 2022-03-15 MED ORDER — HALOPERIDOL LACTATE 2 MG/ML PO CONC
0.5000 mg | ORAL | Status: DC | PRN
  Filled 2022-03-15: qty 5

## 2022-03-15 MED ORDER — FENTANYL CITRATE (PF) 100 MCG/2ML IJ SOLN: 100.0000 ug | INTRAMUSCULAR | Status: DC | PRN

## 2022-03-16 ENCOUNTER — Encounter (HOSPITAL_COMMUNITY): Payer: Self-pay | Admitting: Gastroenterology

## 2022-03-16 ENCOUNTER — Ambulatory Visit: Payer: 59 | Admitting: Gastroenterology

## 2022-04-06 NOTE — Progress Notes (Signed)
Spoke to wife Georgeann Oppenheim and son [name] updated and agreed to speak with the provider when they arrive. Patient is stable on all the drips at this time.

## 2022-04-06 NOTE — Progress Notes (Signed)
Code blue called approx 300am. Patient had a sudden drop in BP and lost pulses. By the time I reached bedside, ROSC achieved. Levo was increased from 50mg >> 228m. Trop was ordered that was elevated. Possibly Demand? Delta trop pending. EKG did not show significant ST changes. Hgb stable just 1 hour prior with no overt bleeding before the code. Patient already intubated. E link recommended ABG, low bicarb on ABG - 2 amps given. Will continue to monitor.

## 2022-04-06 NOTE — Progress Notes (Signed)
[facility] came to pick up pt's body. AC updated.

## 2022-04-06 NOTE — Progress Notes (Signed)
Patient was extubated and placed on comfort measures today. This Probation officer was present bedside and provided spiritual presence, support, prayer, opportunity for life review and reflection for family as patient passed. Chaplain will remain available in order to provide spiritual support and to assess for spiritual need.   Rev. Bennie Pierini, M.Div Chaplain

## 2022-04-06 NOTE — Progress Notes (Signed)
NAME:  Edward [name], MRN:  [id number], DOB:  [birth year], LOS: 5 ADMISSION DATE:  03/13/2022, CONSULTATION DATE:  03/12/22  REFERRING MD:  [facility], CHIEF COMPLAINT:  acute resp failure / vent dep p EGD for varices   History of Present Illness:  82 yowm very remote smoker  with near endstage cirrhosis with ascites /varices admit 11/4 with hematemeis > banding varices 11/5 and self extubated 11/6 am and re-ET sev hours later and PCCM asked to consult with apparent aspiration injury  L >R lung    Pertinent  Medical History   89 yowm  with medical history significant of decompensated cirrhosis of the liver with ascites and esophageal varices, type 2 diabetes, stage III chronic kidney disease, coronary artery disease with stents, hypertension, history of MI, hyperlipidemia.  Patient brought to the hospital due to spitting up blood x 1.  Due to concerns, his wife called EMS and the patient was brought to the hospital for evaluation.  Patient had been hospitalized recently for extensive esophageal variceal bleeding requiring multiple units of blood transfusion and admission to the ICU.  The patient's has not had any more hematemesis or hematochezia.  He does have extensive ascites and he stated that his abdomen felt  tight.         Significant Hospital Events: Including procedures, antibiotic start and stop dates in addition to other pertinent events   EGD  11/5 > Grade 3 varices > banding and remained intubated R IJ  11/5 >>> MRSA Screen 11/5 neg  Urine culture 11/5  > 100k gnr>>>Burkholderia Sensitive to cirpo. Self extubated am 11/6 and reintubated noon 11/6    Scheduled Meds:  sodium chloride   Intravenous Once   brimonidine  1 drop Both Eyes BID   Chlorhexidine Gluconate Cloth  6 each Topical Daily   docusate  100 mg Per Tube BID   insulin aspart  0-6 Units Subcutaneous Q4H   insulin glargine-yfgn  5 Units Subcutaneous Daily   midodrine  20 mg Oral TID WC   mouth rinse  15 mL Mouth  Rinse Q2H   pantoprazole (PROTONIX) IV  40 mg Intravenous Q12H   polyethylene glycol  17 g Per Tube Daily   Continuous Infusions:  sodium chloride     dexmedetomidine (PRECEDEX) IV infusion     fentaNYL infusion INTRAVENOUS 225 mcg/hr (2022/04/01 0617)   norepinephrine (LEVOPHED) Adult infusion 3 mcg/min (03/14/22 1236)   octreotide (SANDOSTATIN) 500 mcg in sodium chloride 0.9 % 250 mL (2 mcg/mL) infusion 50 mcg/hr (03/14/22 1802)   piperacillin-tazobactam (ZOSYN)  IV 3.375 g (Apr 01, 2022 0015)   PRN Meds:.fentaNYL (SUBLIMAZE) injection, fentaNYL (SUBLIMAZE) injection, ipratropium-albuterol, ondansetron **OR** ondansetron (ZOFRAN) IV, mouth rinse    Interim History / Subjective:  Brief code 3 am ? Caused by biting tube?     Objective   Blood pressure (!) 161/77, pulse 74, temperature 97.6 F (36.4 C), temperature source Axillary, resp. rate (!) 30, height '5\' 10"'$  (1.778 m), weight 73.4 kg, SpO2 97 %. CVP:  [4 mmHg-10 mmHg] 6 mmHg  Vent Mode: PCV FiO2 (%):  [60 %-80 %] 70 % Set Rate:  [30 bmp] 30 bmp PEEP:  [10 cmH20] 10 cmH20 Plateau Pressure:  [23 cmH20-28 cmH20] 27 cmH20   Intake/Output Summary (Last 24 hours) at 04/01/2022 0725 Last data filed at 2022/04/01 0339 Gross per 24 hour  Intake 2046.3 ml  Output 550 ml  Net 1496.3 ml   Filed Weights   03/13/22 0623 03/14/22 0543 01-Apr-2022 0500  Weight: 73.5 kg 73.4 kg 73.4 kg     Assessment & Plan:  1) Acute resp failure/ vent dep p EGD/self extubation with likely significant aspiration injury in pt with near endstage Cirrhosis - Zosyn 11/6 >>> >>> fm decision NCB/ shift to comfort care and extubate pm 11/10  Clearly the high likelihood of prolonging suffering from  PCCM  interventions vastly outweighs any reasonable chance of benefit from offering anything else because medical science has done all it can here to restore health.  Agree with plans for comfort care and w/d vent when fm ready

## 2022-04-06 NOTE — Progress Notes (Signed)
[place] Progress Note Patient Name: Edward [name] DOB: July 16, 1950 MRN: [id number]   Date of Service  04-03-2022  HPI/Events of Note  Nursing reports that the patient had another "near code" episode felt to be d/t patient waking up and biting down on ETT or holding breath and dropping his HR. Sedated with Precedex and Fentanyl IV infusions. Will avoid adding benzodiazepines d/t severe liver dysfunction. BP = 149/53 with MAP = 105 and HR = 88 at this time.   eICU Interventions  Plan: Increase ceiling on Fentanyl IV infusion to 300 mcg/hour. Increase ceiling on Precedex IV infusion to 1.5 mcg/Kg/hour.      Intervention Category Major Interventions: Delirium, psychosis, severe agitation - evaluation and management  Callaway Hardigree Eugene 03-Apr-2022, 6:03 AM

## 2022-04-06 NOTE — Progress Notes (Signed)
Code blue called approx 1300. Patient had a sudden drop in BP and HR to the 30s  then he was unresponsive and his eyes were fixed with sats in the 80's  Initial Focused Assessment:  Patient appeared to be having seizure like activity as the HR rapidly increased to 170s and rapidly dropped to the 30's. Patient's extremities were jerking and twitching uncontrollably.   Interventions:  Fentanyl and precedex stopped. Code called. No CPR of ACLS was performed. Code team and Dr. Dyann Kief arrived. Patient's condition stabilized after about 30 minutes. The HR was back to the 80' and B/p increased to 120's /70's. Sats in the 90s.   Plan of Care:  Family arrived about 1430 and decided to withdrawal care. Patient has been extubated at this time. Only drip now is fentanyl. Ativan IV push administered prior to intubation. Son remains at bedside.

## 2022-04-06 NOTE — Progress Notes (Signed)
PROGRESS NOTE  [facility] NGE:[id number] DOB: 08-10-1950 DOA: 03/25/2022 PCP: [name], FNP  Brief History:  71 year old male with a history of diabetes mellitus type 2, hypertension, CKD stage III, coronary artery disease, cryptogenic liver cirrhosis with esophageal varices, coronary disease, hyperlipidemia presenting with an episode of unresponsiveness and spitting up blood.  Notably, the patient was recently hospitalized at [facility] from 02/20/2022 to 03/01/2022 due to hemorrhagic shock from bleeding esophageal varices.  The patient became hypotensive and obtunded.  He was intubated at that time for airway protection.  The patient went underwent EGD on 02/20/2022 which revealed grade 3 esophageal varices with active bleeding.  The varices were banded x6.  He was transfused 8 units PRBC and 2 units FFP.  Palliative medicine was consulted during the admission.  And full scope of care was continued.  The patient was discharged home with hemoglobin 9.5 and serum creatinine 2.26. In the ED, the patient was tachycardic with soft blood pressures with SBP in the 90s and low 100s.  WBC 6.0, hemoglobin 10.1, platelets 61,000.  Sodium 132, potassium 5.5, bicarbonate 19, serum creatinine 3.52.  EKG shows a sinus rhythm without any ST-T wave changes.  UA showed 21-50 WBC.  he went to to the hepatology service at [facility] 12/02/2021--?  Hepatorenal syndrome etc. there was question of Wilson's disease given abnormal copper testing--- he had negative ANA mitochondrial antibodies anti-smooth muscle IgG IgA IgM and negative viral hepatitis panels but has never had a liver biopsy Because of improved MELD score and lack of transportation they declined putting him on the transplant service and recommended he follow closely with hepatology at [facility] hepatology and family was given information about the same  On the evening of 03/09/2022, the patient developed hematemesis.  He became  hypotensive. On the morning of 03/24/2022, 2 units emergent PRBC and FFP were ordered.  GI was consulted to assist with management.  Patient was placed on Levophed. He underwent EGD 03/19/2022 which showed Four columns of grade III varices with stigmata of recent bleeding were found in the middle third of the esophagus and in the lower third of the esophagus.  Three bands were successfully placed with complete eradication, resulting in deflation of varices.He was left intubated after the procedure.  However, patient self-extubated on 03/12/22.  He was reintubated about 3-4 hours later.  He was started on zosyn for aspiration pneumonia.  GI and palliative continue to follow patient.  [place] discussion continue.     Assessment/Plan: Hemorrhagic shock/acute blood loss anemia -Secondary to GI bleed, likely variceal bleed versus bleeding from portal hypertensive gastropathy -3 units PRBC transfused this admission -3 units FFP ordered for transfusion this admission -2 unit platelets given -GI consulted>>EGD 11/5 -11/5 EGD>>Four columns of grade III varices with stigmata of recent bleeding.  Banding x 3 -Serial H&H -Continue ceftriaxone for SBP prophylaxis>>broaded to zosyn for aspiration -Continue octreotide -continue pantoprazole drip -Remain n.p.o. for now -discontinue midodrine as no modality to give being on vent and unable to place OG -not a candidate for TIPS  Aspiration pneumonia -remains on ventilator with FiO2 0.8 -continue IV zosyn -Follow recommendation by PCCM.  Acute respiratory failure with hypoxia -due to aspiration pneumonia and some degree of third spacing  -remains ventilator dependent -PCCM following -reintubated 3-4 hours after self extubation 03/12/22 -personally reviewed CXR 11.6>>L>R opacities -Overall prognosis is guarded. -After discussing with PCCM if family wants to continue pursuing full scope of  practice patient will benefit of transfer to critical care service at  [facility].  Acute on chronic renal failure--CKD stage IIIb -Secondary to hypotension and volume depletion -Continue holding furosemide and spironolactone temporarily -Continue IV albumin transfusion -03/24/2022 serum creatinine peaked at 3.99 -Baseline creatinine 2.2-2.4 -11/8 current Cr 3.67  UTI -UA 21-50 WBC -urine culture = >100,000 colonies of GNR -NOT CAUTI -Follow-up speciation and continue current antibiotics.   Decompensated liver cirrhosis -Not a candidate for TIPS -03/13/2022 MELD 30 -11/14/2021 hemoglobin A1c 6.7 -NovoLog sliding scale -he has been declined at [facility] hepatology for above reasons-he will need to follow-up with [facility]. -Overall prognosis is very poor; demonstrating presumably hepatorenal syndrome development.   Episode of Unresponsivness -Sedated, intubated and mechanically ventilated  -unable to properly assess responsiveness.   Coronary Artery Disease -no chest pain presently -EKG/telemetry without acute ischemic changes.   Diabetes Mellitus type 2 11/14/21 A1C--6.7 -Continue sliding scale insulin and long-acting insulin.  Goals of Care -remains full code -We discussed the patient's overall poor prognosis in the setting of his liver cirrhosis and recurrent life threatening bleed/shock and multisystem organ failure in the setting of his numerous other co-morbidities.   -Patient continued to actively declining despite full scope of treatment/intervention. -After long discussion with family members on 03/14/2022 anticipated plan is for terminal extubation and full comfort care later today. -Will continue to follow palliative care assistance, recommendation and management.     Family Communication:  whole family updated at bedside on 03/14/22.   Consultants:  GI, palliative   Code Status:  DNR/DNI   DVT Prophylaxis:  SCDs     Procedures: As Listed in Progress Note Above   Antibiotics: Ceftriaxone 11/4>>11/5 Zosyn 11/6>>11/9     Subjective: Remains sedated, intubated and mechanically ventilated.  No fever.  No overt bleeding reported.  Patient with further deterioration of his renal function and near code episode overnight drop in his heart rate.  Sedation medications were adjusted and patient conditions are stabilized.  Objective: Vitals:   Mar 31, 2022 1405 03-31-2022 1406 03-31-2022 1407 03/31/2022 1408  BP:      Pulse: 71 69 70 71  Resp: (!) 30 (!) 30 (!) 30 (!) 30  Temp:      TempSrc:      SpO2: 100% 98% 100% 99%  Weight:      Height:        Intake/Output Summary (Last 24 hours) at 31-Mar-2022 1619 Last data filed at 03-31-2022 1611 Gross per 24 hour  Intake 2608.01 ml  Output 450 ml  Net 2158.01 ml   Weight change: 0 kg  Exam: General exam: Sedated, intubated and mechanically ventilated; no following commands.  Currently afebrile. Respiratory system: Positive rhonchi bilaterally; no wheezing. Cardiovascular system:RRR. No rubs or gallops; no JVD on exam. Gastrointestinal system: Abdomen is distended and is slightly more tense on evaluation today; positive distant bowel sounds appreciated.  No masses.   Central nervous system: Limited examination secondary to sedatives and ventilatory support. Extremities: No cyanosis or clubbing. Skin: No petechiae; multiple scabs, abrasion and bruises on his skin appreciated. Psychiatry: Judgement and insight on able to be evaluated due to ongoing sedation and mechanical ventilation.  Not following commands.  Nurses report intermittent episodes of agitation  Data Reviewed: I have personally reviewed following labs and imaging studies  Basic Metabolic Panel: Recent Labs  Lab 03/12/22 0140 03/13/22 0343 03/14/22 0407 2022-03-31 0204 03-31-22 1200  NA 134* 136 139 141 146*  K 4.9 4.0 4.1 3.5 3.3*  CL 101 103 106 109 112*  CO2 22 19* 20* 19* 16*  GLUCOSE 258* 224* 188* 153* 152*  BUN 69* 78* 86* 92* 93*  CREATININE 3.55* 3.78* 3.67* 3.77* 3.89*  CALCIUM 8.6* 8.6*  8.7* 8.7* 8.7*  MG  --   --  1.9 2.0  --    Liver Function Tests: Recent Labs  Lab 03/19/2022 1427 03/14/2022 0756 03/12/22 0140 03/13/22 0343 27-Mar-2022 0204  AST 71* 42* 32 25 37  ALT <5 32 '26 20 27  '$ ALKPHOS 78 42 45 35* 37*  BILITOT 3.5* 3.5* 9.1* 10.9* 14.0*  PROT 5.9* 5.4* 5.8* 5.6* 5.4*  ALBUMIN 3.1* 3.8 4.1 4.1 3.2*    Recent Labs  Lab 03/16/2022 1427 03/22/2022 1417 03/12/22 0140  AMMONIA 34 60* 41*   Coagulation Profile: Recent Labs  Lab 03/21/2022 1427 03/30/2022 0756 03/12/22 0140  INR 1.5* 2.1* 1.6*   CBC: Recent Labs  Lab 03/31/2022 0756 03/08/2022 1400 03/12/22 0140 03/12/22 0742 03/12/22 1203 03/12/22 1910 03/13/22 0343 03/13/22 0659 03/14/22 0407 03/14/22 1037 03/14/22 1618 03/14/22 2110 2022/03/27 0204 03-27-2022 0800 2022-03-27 1325  WBC 6.2  --  4.6  --  6.7  --  8.8  --  8.2  --   --   --   --   --   --   HGB 6.1*   < > 8.7*   < > 11.2*   < > 10.2*   < > 9.6*   < > 9.7* 10.2* 10.1* 11.2* 10.6*  HCT 18.8*   < > 25.2*   < > 34.1*   < > 29.2*   < > 27.4*   < > 28.5* 29.4* 28.5* 32.4* 32.2*  MCV 100.5*  --  91.3  --  96.3  --  91.5  --  90.7  --   --   --   --   --   --   PLT 56*  --  46*  --  62*  --  54*  --  41*  --   --   --   --   --   --    < > = values in this interval not displayed.   CBG: Recent Labs  Lab 03/14/22 2111 March 27, 2022 0014 27-Mar-2022 0417 March 27, 2022 0813 2022-03-27 1156  GLUCAP 148* 151* 154* 151* 134*   HbA1C: No results for input(s): "HGBA1C" in the last 72 hours.  Urine analysis:    Component Value Date/Time   COLORURINE YELLOW 03/19/2022 1550   APPEARANCEUR CLEAR 03/20/2022 1550   LABSPEC 1.011 03/24/2022 1550   PHURINE 5.0 03/25/2022 1550   GLUCOSEU 150 (A) 04/05/2022 1550   HGBUR NEGATIVE 03/09/2022 1550   BILIRUBINUR NEGATIVE 03/08/2022 [place] 03/07/2022 1550   PROTEINUR NEGATIVE 03/20/2022 1550   NITRITE NEGATIVE 04/02/2022 1550   LEUKOCYTESUR SMALL (A) 03/31/2022 1550   Sepsis Labs:  Recent  Results (from the past 240 hour(s))  Culture, body fluid w Gram Stain-bottle     Status: None   Collection Time: 03/06/22  1:20 PM   Specimen: Peritoneal Washings  Result Value Ref Range Status   Specimen Description PERITONEAL  Final   Special Requests BAA 10CC  Final   Culture   Final    NO GROWTH 5 DAYS Performed at [facility], [address] [place], [place] [address]    Report Status 03/07/2022 FINAL  Final  Gram stain     Status: None   Collection Time: 03/06/22  1:20 PM  Specimen: Peritoneal Washings  Result Value Ref Range Status   Specimen Description PERITONEAL  Final   Special Requests NONE  Final   Gram Stain   Final    NO ORGANISMS SEEN WBC PRESENT,BOTH PMN AND MONONUCLEAR CYTOSPIN SMEAR Performed at [facility], [address] [place], [place] [address]    Report Status 03/06/2022 FINAL  Final  MRSA Next Gen by PCR, Nasal     Status: None   Collection Time: 03/12/2022 12:29 PM   Specimen: Nasal Mucosa; Nasal Swab  Result Value Ref Range Status   MRSA by PCR Next Gen NOT DETECTED NOT DETECTED Final    Comment: (NOTE) The GeneXpert MRSA Assay (FDA approved for NASAL specimens only), is one component of a comprehensive MRSA colonization surveillance program. It is not intended to diagnose MRSA infection nor to guide or monitor treatment for MRSA infections. Test performance is not FDA approved in patients less than 19 years old. Performed at [facility], [address] [place], [place] [address]   Urine Culture     Status: Abnormal   Collection Time: 03/26/2022  3:50 PM   Specimen: Urine, Catheterized  Result Value Ref Range Status   Specimen Description   Final    URINE, CATHETERIZED Performed at [facility], [address] [place],  [address]    Special Requests   Final    NONE Performed at [facility], [address] [place],  [address]    Culture >=100,000 COLONIES/mL BURKHOLDERIA SPECIES (A)  Final   Report Status 03/14/2022 FINAL   Final   Organism ID, Bacteria BURKHOLDERIA SPECIES (A)  Final      Susceptibility   Burkholderia species - MIC*    CEFAZOLIN >=64 RESISTANT Resistant     GENTAMICIN RESISTANT Resistant     CIPROFLOXACIN 1 SENSITIVE Sensitive     IMIPENEM >=16 RESISTANT Resistant     TRIMETH/SULFA <=20 SENSITIVE Sensitive     * >=100,000 COLONIES/mL BURKHOLDERIA SPECIES     Scheduled Meds:   Continuous Infusions:  sodium chloride     fentaNYL infusion INTRAVENOUS 275 mcg/hr (04/09/2022 1611)    Procedures/Studies: DG Chest Port 1 View  Result Date: 03/13/2022 CLINICAL DATA:  Acute respiratory failure with hypoxemia. EXAM: PORTABLE CHEST 1 VIEW COMPARISON:  March 12, 2022. FINDINGS: The heart size and mediastinal contours are within normal limits. Endotracheal and right internal jugular catheters are unchanged in position. Stable bilateral lung opacities are noted concerning for multifocal pneumonia or edema. The visualized skeletal structures are unremarkable. IMPRESSION: Stable support apparatus. Stable bilateral lung opacities as described above. Electronically Signed   By: Marijo Conception M.D.   On: 03/13/2022 10:17   DG CHEST PORT 1 VIEW  Result Date: 03/12/2022 CLINICAL DATA:  Endotracheal tube placement. EXAM: PORTABLE CHEST 1 VIEW COMPARISON:  Same day. FINDINGS: Endotracheal tube is in grossly good position. Right internal jugular catheter is unchanged. Significantly increased bilateral lung opacities are noted concerning for pneumonia or possibly edema. Small bilateral pleural effusions are most likely present. Bony thorax is unremarkable. IMPRESSION: Endotracheal tube in grossly good position. Significantly increased bilateral lung opacities as described above. Electronically Signed   By: Marijo Conception M.D.   On: 03/12/2022 12:21   DG CHEST PORT 1 VIEW  Result Date: 03/12/2022 CLINICAL DATA:  Respiratory distress EXAM: PORTABLE CHEST 1 VIEW COMPARISON:  Previous studies including the  examination done earlier today FINDINGS: Transverse diameter of heart is in the upper limits of normal. Diffuse alveolar densities  are seen in left parahilar region and left lower lung field. Increased interstitial markings are seen in right parahilar region. No significant interval changes are noted in comparison with the immediate previous study. Right lateral CP angle is clear. There is minimal blunting of left lateral CP angle. There is no pneumothorax. Faint opacity seen in the right apex may be an artifact such as skin fold. Tip of right IJ central venous catheter is seen at the junction of superior vena cava and right atrium. IMPRESSION: Extensive interstitial and alveolar densities are seen in both lungs, more so on the left side with no significant interval change. Findings suggest multifocal pneumonia. Part of this finding may be due to pulmonary edema. Small left pleural effusion. Electronically Signed   By: Elmer Picker M.D.   On: 03/12/2022 08:00   DG CHEST PORT 1 VIEW  Result Date: 03/12/2022 CLINICAL DATA:  Acute respiratory failure with hypoxia EXAM: PORTABLE CHEST 1 VIEW COMPARISON:  04/02/2022 FINDINGS: Endotracheal tube tip 3 cm above the carina. Central line tip at the [facility] RA junction. Worsening of infiltrate in the perihilar lung bilaterally, worse on the left than the right. This is presumed represent pneumonia, though an element of edema could be present. IMPRESSION: Worsening of perihilar infiltrate, worse on the left than the right. This is presumed to represent pneumonia, though an element of edema could be present. Electronically Signed   By: Nelson Chimes M.D.   On: 03/12/2022 07:19   DG CHEST PORT 1 VIEW  Result Date: 03/30/2022 CLINICAL DATA:  Status post retraction of a right jugular catheter. EXAM: PORTABLE CHEST 1 VIEW COMPARISON:  Earlier today. FINDINGS: The right jugular catheter has been retracted with its tip at the superior cavoatrial junction. The endotracheal  tube is unchanged. Bilateral linear densities, mildly increased on the left and mildly improved on the right. This includes a persistent curvy linear density at the right lung base, simulating pneumoperitoneum. Thoracic spine degenerative changes. IMPRESSION: 1. Right jugular catheter tip at the superior cavoatrial junction. 2. Bilateral atelectasis, mildly increased on the left and mildly improved on the right. Electronically Signed   By: Claudie Revering M.D.   On: 04/04/2022 16:23   DG Chest Port 1 View  Result Date: 04/04/2022 CLINICAL DATA:  Respiratory failure.  Intubation. EXAM: PORTABLE CHEST 1 VIEW COMPARISON:  04/04/2022 and prior studies FINDINGS: Endotracheal tube is now noted with tip 3.5 cm above the carina. RIGHT IJ central venous catheter with tip overlying the RIGHT atrium again noted. This is a low volume study with slightly increasing bibasilar opacities/atelectasis. There is no evidence of pneumothorax. There may be trace bilateral pleural effusions present. No other significant changes noted. IMPRESSION: 1. Endotracheal tube with tip 3.5 cm above the carina. 2. RIGHT IJ central venous catheter with tip overlying the RIGHT atrium, consider 3-4 cm retraction. 3. Slightly increasing bibasilar opacities/atelectasis with possible trace bilateral pleural effusions. Electronically Signed   By: Margarette Canada M.D.   On: 03/18/2022 14:03   DG Chest Port 1 View  Result Date: 04/05/2022 CLINICAL DATA:  Central line placement. Shortness of breath. Syncope. EXAM: PORTABLE CHEST 1 VIEW COMPARISON:  03/17/2022 FINDINGS: A new right jugular central venous catheter seen with tip overlying the mid right atrium, approximately 4 cm below the superior cavoatrial junction. No evidence of pneumothorax. Low lung volumes are again seen with mild bibasilar atelectasis. Evidence of pulmonary consolidation or pleural effusion. Heart size is within normal limits. IMPRESSION: New right jugular central venous catheter  tip  overlies the mid right atrium, approximately 4 cm below the superior cavoatrial junction. No evidence of pneumothorax. Low lung volumes and bibasilar atelectasis, without significant change. Electronically Signed   By: Marlaine Hind M.D.   On: 04/05/2022 10:25   DG Chest Portable 1 View  Result Date: 03/30/2022 CLINICAL DATA:  Shortness of breath. Syncope. EXAM: PORTABLE CHEST 1 VIEW COMPARISON:  02/21/2022 FINDINGS: Normal sized heart. Poor inspiration. Minimal linear atelectasis/scarring at both lung bases. Otherwise, clear lungs with normal vascularity. Thoracic spine degenerative changes. IMPRESSION: Poor inspiration with minimal bibasilar linear atelectasis/scarring. Electronically Signed   By: Claudie Revering M.D.   On: 04/02/2022 14:01   US Paracentesis  Result Date: 03/06/2022 INDICATION: Cirrhosis, ascites EXAM: ULTRASOUND GUIDED DIAGNOSTIC AND THERAPEUTIC PARACENTESIS MEDICATIONS: None. COMPLICATIONS: None immediate. PROCEDURE: Informed written consent was obtained from the patient after a discussion of the risks, benefits and alternatives to treatment. A timeout was performed prior to the initiation of the procedure. Initial ultrasound scanning demonstrates a large amount of ascites within the right lower abdominal quadrant. The right lower abdomen was prepped and draped in the usual sterile fashion. 1% lidocaine was used for local anesthesia. Following this, a 19 gauge, 7-cm, Yueh catheter was introduced. An ultrasound image was saved for documentation purposes. The paracentesis was performed. The catheter was removed and a dressing was applied. The patient tolerated the procedure well without immediate post procedural complication. Patient received post-procedure intravenous albumin; see nursing notes for details. FINDINGS: A total of approximately 5 of yellow fluid was removed. Samples were sent to the laboratory as requested by the clinical team. IMPRESSION: Successful ultrasound-guided  paracentesis yielding 5 liters of peritoneal fluid. Electronically Signed   By: Lavonia Dana M.D.   On: 03/06/2022 14:00   IR Paracentesis  Result Date: 02/27/2022 INDICATION: Patient with a history of cirrhosis and recurrent ascites. Interventional radiology asked to perform a diagnostic and therapeutic paracentesis. EXAM: ULTRASOUND GUIDED PARACENTESIS MEDICATIONS: 1% lidocaine 10 mL COMPLICATIONS: None immediate. PROCEDURE: Informed written consent was obtained from the patient after a discussion of the risks, benefits and alternatives to treatment. A timeout was performed prior to the initiation of the procedure. Initial ultrasound scanning demonstrates a large amount of ascites within the right lower abdominal quadrant. The right lower abdomen was prepped and draped in the usual sterile fashion. 1% lidocaine was used for local anesthesia. Following this, a 19 gauge, 7-cm, Yueh catheter was introduced. An ultrasound image was saved for documentation purposes. The paracentesis was performed. The catheter was removed and a dressing was applied. The patient tolerated the procedure well without immediate post procedural complication. Patient received post-procedure intravenous albumin; see nursing notes for details. FINDINGS: A total of approximately 5.4 L of clear yellow fluid was removed. Samples were sent to the laboratory as requested by the clinical team. IMPRESSION: Successful ultrasound-guided paracentesis yielding 5.4 liters of peritoneal fluid. Read by: Soyla Dryer, NP PLAN: The patient has previously been evaluated by the [facility] Interventional Radiology Portal Hypertension [facility], and deemed not a candidate for intervention at this time due to persistently elevated Na-MELD Electronically Signed   By: Ruthann Cancer M.D.   On: 02/27/2022 14:36   DG Swallowing Func-Speech Pathology  Result Date: 02/27/2022 Table formatting from the original result was not included. Objective Swallowing  Evaluation: Type of Study: MBS-Modified Barium Swallow Study  Patient Details Name: ZIAN DELAIR MRN: [id number] Date of Birth: 03/15/1951 Today's Date: 02/27/2022 Time: SLP Start Time (ACUTE ONLY): 1237 -SLP Stop Time (  ACUTE ONLY): 1255 SLP Time Calculation (min) (ACUTE ONLY): 18 min Past Medical History: Past Medical History: Diagnosis Date  Cataract   CKD (chronic kidney disease) stage 3, GFR 30-59 ml/min (HCC)   Coronary atherosclerosis of native coronary artery   BMS LAD 2006; residual 75% distal RCA; EF 50%  Essential hypertension   Glaucoma   Low back pain   MI (myocardial infarction) ([place])   Anterior 2006  Mixed hyperlipidemia   Renal insufficiency   Type 2 diabetes mellitus ([place])  Past Surgical History: Past Surgical History: Procedure Laterality Date  CATARACT EXTRACTION, BILATERAL    CORONARY ANGIOPLASTY WITH STENT PLACEMENT  2006  ESOPHAGEAL BANDING  02/01/2022  Procedure: ESOPHAGEAL BANDING;  Surgeon: Eloise Harman, DO;  [facility];  Service: Endoscopy;;  ESOPHAGEAL BANDING  02/20/2022  Procedure: ESOPHAGEAL BANDING;  Surgeon: Ladene Artist, MD;  Location: [facility] ENDOSCOPY;  Service: Gastroenterology;;  ESOPHAGOGASTRODUODENOSCOPY (EGD) WITH PROPOFOL N/A 02/01/2022  Procedure: ESOPHAGOGASTRODUODENOSCOPY (EGD) WITH PROPOFOL;  Surgeon: Eloise Harman, DO;  Location: [facility] ENDO SUITE;  Service: Endoscopy;  Laterality: N/A;  12:30 am  ESOPHAGOGASTRODUODENOSCOPY (EGD) WITH PROPOFOL N/A 02/20/2022  Procedure: ESOPHAGOGASTRODUODENOSCOPY (EGD) WITH PROPOFOL;  Surgeon: Ladene Artist, MD;  Location: [facility] ENDOSCOPY;  Service: Gastroenterology;  Laterality: N/A; HPI: JAEVEN WANZER is a 71 y.o. male who presented to [place] 10/16 with cramping of the hands and feet.  He had had a paracentesis earlier that day with 4 L removed (see below).  While in ED, he began to spit up bright red blood.  This progressed to frank hematemesis, large-volume and persistent.  He was started on Protonix, octreotide,  ceftriaxone.  He then became hypotensive; therefore, massive transfusion protocol was initiated.  He received 8 units PRBC, 2 units FFP, 1 unit platelets.  He was intubated for airway protection was also started on Levophed. Pt with EGD 10/18 with banding x6. CXR 10/18 without focal infiltrate. 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  He was apparently evaluated at [facility] for transplant but was deemed to not be a candidate.  No data recorded  Recommendations for follow up therapy are one component of a multi-disciplinary discharge planning process, led by the attending physician.  Recommendations may be updated based on patient status, additional functional criteria and insurance authorization. Assessment / Plan / Recommendation   02/27/2022   1:11 PM Clinical Impressions Clinical Impression Pt presents with a mild oropharyngeal dysphagia c/b delayed swallow initiation, reduced base of tongue retraction, incomplete laryngeal closure, and diminished sensation.  These deficits resulted in penetration of thin liquid during the swallow.  Penetration was silent, but did fully clear with subsequent swallows.  There was moderate vallecula residue with all consistencies which was also reduced with subequent swallows.  With pill simulation, there was brief stasis of tablet in vallecula.  There was esophagea stasis with table which was not fully cleared by liquid wash, but did clear with puree bolus.  Recommend advancing pt to regular texture solids as medically appropriate.  Alternate liquids and solids.  SLP Visit Diagnosis Dysphagia, oropharyngeal phase (R13.12) Impact on safety and function Mild aspiration risk     02/27/2022   1:11 PM Treatment Recommendations Treatment Recommendations No treatment recommended at this  time     02/27/2022   1:15 PM Prognosis Prognosis for Safe Diet Advancement --  02/27/2022   1:11 PM Diet Recommendations SLP Diet Recommendations Thin liquid;Regular solids Liquid Administration via Straw Medication Administration Whole meds with liquid Compensations Slow rate;Small sips/bites;Follow solids with liquid     02/27/2022   1:11 PM Other Recommendations Oral Care Recommendations Oral care BID Follow Up Recommendations No SLP follow up Assistance recommended at discharge -- Functional Status Assessment Patient has not had a recent decline in their functional status   02/23/2022  12:29 PM Frequency and Duration  Speech Therapy Frequency (ACUTE ONLY) min 2x/week Treatment Duration 2 weeks     02/27/2022   1:06 PM Oral Phase Oral - Thin Straw Premature spillage Oral - Puree WFL Oral - Regular [facility]    02/27/2022   1:09 PM Pharyngeal Phase Pharyngeal- Thin Straw Reduced tongue base retraction;Penetration/Aspiration during swallow Pharyngeal- Puree Pharyngeal residue - valleculae;Reduced tongue base retraction;Delayed swallow initiation-vallecula Pharyngeal- Regular Reduced tongue base retraction;Pharyngeal residue - valleculae Pharyngeal- Pill Reduced tongue base retraction;Pharyngeal residue - valleculae Pharyngeal Material does not enter airway    02/27/2022   1:08 PM Cervical Esophageal Phase  Cervical Esophageal Phase Impaired Celedonio Savage, MA, CCC-SLP Acute Rehabilitation Services Office: [phone] 02/27/2022, 1:20 PM                     DG Chest Port 1 View  Result Date: 02/21/2022 CLINICAL DATA:  Ventilator dependent respiratory failure. EXAM: PORTABLE CHEST 1 VIEW COMPARISON:  Portable chest yesterday at 10:23 p.m. FINDINGS: 4:59 a.m. ETT tip is 3.1 cm from the carina, left IJ central line tip in the distal SVC. There are multiple overlying monitor wires. There are low lung volumes. Mild elevation right hemidiaphragm limiting visualization of the right base. There is perihilar and right  basilar linear atelectasis without focal consolidation the visualized lungs. The cardiomediastinal silhouette and vasculature are normal. There is thoracic spondylosis. IMPRESSION: Limited exam with low lung volumes and elevated right hemidiaphragm. Assessment of the bases is limited. The visualized lungs without focal infiltrates. Support apparatus as above. Electronically Signed   By: Telford Nab M.D.   On: 02/21/2022 06:18   DG CHEST PORT 1 VIEW  Result Date: 02/20/2022 CLINICAL DATA:  Central line placement EXAM: PORTABLE CHEST 1 VIEW COMPARISON:  02/20/2022 FINDINGS: Left central line is been placed with the tip in the SVC. No pneumothorax. Endotracheal tube is 5 cm above the carina. Heart normal size. No confluent opacities or effusions. IMPRESSION: Support devices in expected position as above. No acute cardiopulmonary disease. Electronically Signed   By: Rolm Baptise M.D.   On: 02/20/2022 22:30   DG Chest Port 1 View  Result Date: 02/20/2022 CLINICAL DATA:  Intubation EXAM: PORTABLE CHEST 1 VIEW COMPARISON:  02/13/2022 FINDINGS: Endotracheal tube with tip just below the clavicular heads. The enteric tube at least reaches the stomach. Artifact from EKG leads. Low volume chest with streaky density at the bases suggesting atelectasis. No effusion or pneumothorax. Skin fold at the left apex. IMPRESSION: 1. Unremarkable hardware positioning. 2. Low volume chest with mild atelectasis. Electronically Signed   By: Jorje Guild M.D.   On: 02/20/2022 04:13   US Paracentesis  Result Date: 02/19/2022 INDICATION: Ascites secondary to cirrhosis EXAM: ULTRASOUND GUIDED  PARACENTESIS MEDICATIONS: None. COMPLICATIONS: None immediate. PROCEDURE: Informed written consent was obtained from the patient after a discussion of the risks, benefits and alternatives to treatment. A timeout was performed prior to the initiation of the procedure. Initial ultrasound scanning demonstrates a large amount of ascites  within the right  lower abdominal quadrant. The right lower abdomen was prepped and draped in the usual sterile fashion. 1% lidocaine was used for local anesthesia. Following this, a 19 gauge, 7-cm, Yueh catheter was introduced. An ultrasound image was saved for documentation purposes. The paracentesis was performed. The catheter was removed and a dressing was applied. The patient tolerated the procedure well without immediate post procedural complication. Patient received post-procedure intravenous albumin; see nursing notes for details. FINDINGS: A total of approximately 4.0 L of serous fluid was removed. Samples were sent to the laboratory as requested by the clinical team. IMPRESSION: Successful ultrasound-guided paracentesis yielding 4.0 L of peritoneal fluid. Electronically Signed   By: Abigail Miyamoto M.D.   On: 02/19/2022 12:13   US Paracentesis  Result Date: 02/14/2022 INDICATION: Cirrhosis with recurrent ascites EXAM: ULTRASOUND GUIDED RLQ PARACENTESIS MEDICATIONS: None. COMPLICATIONS: None immediate. PROCEDURE: Informed written consent was obtained from the patient after a discussion of the risks, benefits and alternatives to treatment. A timeout was performed prior to the initiation of the procedure. Initial ultrasound scanning demonstrates a large amount of ascites within the right lower abdominal quadrant. The right lower abdomen was prepped and draped in the usual sterile fashion. 1% lidocaine was used for local anesthesia. Following this, a 19 gauge, 7-cm, Yueh catheter was introduced. An ultrasound image was saved for documentation purposes. The paracentesis was performed. The catheter was removed and a dressing was applied. The patient tolerated the procedure well without immediate post procedural complication. Patient received post-procedure intravenous albumin; see nursing notes for details. FINDINGS: A total of approximately 3.4L of peritoneal fluid was removed. Samples were sent to the  laboratory as requested by the clinical team. IMPRESSION: Successful ultrasound-guided paracentesis yielding 3.4 liters of peritoneal fluid. PLAN: The patient has required >/=2 paracenteses in a 30 day period and a formal evaluation by the [facility] has been arranged. Electronically Signed   By: Lavonia Dana M.D.   On: 02/14/2022 10:56    Barton Dubois, MD  [facility]  If 7PM-7AM, please contact night-coverage [email or web address] Password [email or web address]  2022/03/29, 4:19 PM   LOS: 5 days

## 2022-04-06 NOTE — Progress Notes (Signed)
Placed air in patient's cuff this morning.  Put air in cuff at beginning of shift as well.

## 2022-04-06 NOTE — Progress Notes (Signed)
   2022/03/30 1636  Attending [place]  Attending Physician Notified Y  Attending Physician (First and Last Name) Dr. Cindy Hazy Mortem Checklist  Date of Death 03/30/2022  Time of Death August 02, 1630  Pronounced By [place]  Next of kin notified Yes  Name of next of kin notified of death [place] Person's Relationship to Patient Son  Contact Person's Phone Number [phone]  Contact Person's address [place], [place], [address]  Family Communication Notes Wife, sister-in-law and 2 sons present  Was the patient a No Code Blue or a Limited Code Blue? Yes  Did the patient die unattended? No  Patient restrained? (!) Restrained within 24 hours of death  Height '5\' 10"'$  (1.778 m)  Weight 73.4 kg  [employer] (previously known as [employer])  Notification Date March 30, 2022  Notification Time 08/02/35  [employer] Number [id number]  Is patient a potential donor? Other (Comment) (Per Tameika Spruill of [employer], the patient may be a potential donor for eyes at least.)  Autopsy  Autopsy requested by MD or Family ( Non ME Case) N/A  Patient and [place] Returned  Patient is satisfied that all belongings have been returned? Not applicable  Dermatherapy linen/gowns NOT sent with patient or transporter Not applicable  Dead on Arrival (Emergency Department)  Patient dead on arrival? No  Medical Examiner  Is this a medical examiner's case? [facility] home name/address/phone # [place] [phone]  Planned location of pickup [place]   Post mortem care provided. Body is prepared and still in the room awaiting stretcher to transport to the morgue.

## 2022-04-06 NOTE — Progress Notes (Signed)
At 1555 HR patient was extubated to room air per order. RN and Chaplain at bedside.

## 2022-04-06 NOTE — Progress Notes (Addendum)
Palliative: Mr. Pargas is lying quietly in bed.  He appears acutely/chronically ill and quite frail.  He is intubated/ventilated and sedated.  There is no family at bedside at this time.  Detailed discussion with bedside nursing staff.  Family meeting at bedside with spouse Georgeann Oppenheim, sons [name] and beth, spackman.  [name] and family shared that they feel that Mr. Janit Pagan has suffered enough.  They are ready for compassionate extubation.  We talk about comfort and dignity, reassuring family that this is a loving way to care for Mr. Abebe.  We talk about compassionate extubation to room air so that we do not prolong Mr. McCombs suffering.  Full comfort care, end-of-life order set implemented.  [name] shares that he does not want his father to struggle or wake up.  [name] will be present for extubation.   Conference with attending, bedside nursing staff, respiratory therapy, spiritual care, transition of care team related to patient condition, needs, goals of care, disposition.  Plan: Comfort and dignity at end-of-life, compassionate extubation, full comfort care.     End-of-life order set implemented Addendum: If fentanyl ineffective for symptom management, consider transition to Dilaudid, NOT morphine due to increased creatinine.  79 minutes  Quinn Axe, NP Palliative medicine team Team phone [phone] Greater than 50% of this time was spent counseling and coordinating care related to the above assessment and plan.

## 2022-04-06 NOTE — Accreditation Note (Signed)
Restraint death within 24 hours of soft bilateral wrist restraints removal, logged 03/20/2022 at 0908 by Jya Hughston RNBSN.

## 2022-04-06 NOTE — Progress Notes (Signed)
Received call from [place]. He had several question the RN was able to answer, then Ellard Artis had to hang up briefly.

## 2022-04-06 NOTE — Progress Notes (Addendum)
[facility] Progress Note Patient Name: Edward [name] DOB: 1951-02-22 MRN: [id number]   Date of Service  2022-04-11  HPI/Events of Note  K+ = 3.5 and Creatinine = 3.77.  eICU Interventions  Plan: Will not replace K+ at this time given deteriorating renal function and K+ at lower limit of normal.  Repeat K+ at 12 noon.      Intervention Category Major Interventions: Electrolyte abnormality - evaluation and management  Nachman Sundt [place] 04/11/22, 5:25 AM

## 2022-04-06 NOTE — Death Summary Note (Signed)
DEATH SUMMARY   Patient Details  Name: Edward [name] MRN: [id number] DOB: [birth year] MPN:TIRWER, Mary-Margaret, FNP Admission/Discharge Information   Admit Date:  April 05, 2022  Date of Death: Date of Death: 04/10/22  Time of Death: Time of Death: 1630-08-13  Length of Stay: 5   Principle Cause of death: Sepsis secondary to aspiration pneumonia, in a patient with decompensated liver cirrhosis with ascites, esophageal bleeding and concerns for hepatorenal syndrome.  Hospital Diagnoses: Principal Problem:   AKI (acute kidney injury) ([place]) Active Problems:   Decompensated hepatic cirrhosis (HCC)   CKD (chronic kidney disease) stage 3B, GFR 30-59 ml/min (HCC)   Thrombocytopenia (HCC)   Essential hypertension   Type 2 diabetes with nephropathy (HCC)   Hypoalbuminemia   Secondary esophageal varices without bleeding (HCC)   Cirrhosis of liver with ascites (HCC)   Upper GI bleeding   Esophageal varices with bleeding in diseases classified elsewhere ([place])   Hemorrhagic shock (HCC)   Acute respiratory failure requiring reintubation ([place])   Aspiration into airway   Hospital Course: 71 year old male with a history of diabetes mellitus type 2, hypertension, CKD stage III, coronary artery disease, cryptogenic liver cirrhosis with esophageal varices, coronary disease, hyperlipidemia presenting with an episode of unresponsiveness and spitting up blood.  Notably, the patient was recently hospitalized at [facility] from 02/20/2022 to 03/01/2022 due to hemorrhagic shock from bleeding esophageal varices.  The patient became hypotensive and obtunded.  He was intubated at that time for airway protection.  The patient went underwent EGD on 02/20/2022 which revealed grade 3 esophageal varices with active bleeding.  The varices were banded x6.  He was transfused 8 units PRBC and 2 units FFP.  Palliative medicine was consulted during the admission.  And full scope of care was continued.  The patient was  discharged home with hemoglobin 9.5 and serum creatinine 2.26. In the ED, the patient was tachycardic with soft blood pressures with SBP in the 90s and low 100s.  WBC 6.0, hemoglobin 10.1, platelets 61,000.  Sodium 132, potassium 5.5, bicarbonate 19, serum creatinine 3.52.  EKG shows a sinus rhythm without any ST-T wave changes.  UA showed 21-50 WBC.  he went to to the hepatology service at [facility] 12/02/2021--?  Hepatorenal syndrome etc. there was question of Wilson's disease given abnormal copper testing--- he had negative ANA mitochondrial antibodies anti-smooth muscle IgG IgA IgM and negative viral hepatitis panels but has never had a liver biopsy Because of improved MELD score and lack of transportation they declined putting him on the transplant service and recommended he follow closely with hepatology at [facility] hepatology and family was given information about the same  On the evening of 2022-04-05, the patient developed hematemesis.  He became hypotensive. On the morning of 03/29/2022, 2 units emergent PRBC and FFP were ordered.  GI was consulted to assist with management.  Patient was placed on Levophed. He underwent EGD 03/28/2022 which showed Four columns of grade III varices with stigmata of recent bleeding were found in the middle third of the esophagus and in the lower third of the esophagus.  Three bands were successfully placed with complete eradication, resulting in deflation of varices.He was left intubated after the procedure.  However, patient self-extubated on 03/12/22.  He was reintubated about 3-4 hours later.  He was started on zosyn for aspiration pneumonia.  GI and palliative continue to follow patient.  [place] discussion continue.    Assessment and Plan: Hemorrhagic shock/acute blood loss anemia -Secondary to GI bleed,  likely variceal bleed versus bleeding from portal hypertensive gastropathy -3 units PRBC transfused this admission -3 units FFP ordered for transfusion this  admission -2 unit platelets given -GI consulted>>EGD 11/5 -11/5 EGD>>Four columns of grade III varices with stigmata of recent bleeding.  Banding x 3 -Serial H&H -Continue ceftriaxone for SBP prophylaxis>>broaded to zosyn for aspiration -Continue octreotide -continue pantoprazole drip -Remain n.p.o. for now -discontinue midodrine as no modality to give being on vent and unable to place OG -not a candidate for TIPS   Aspiration pneumonia -remains on ventilator with FiO2 0.8 -continue IV zosyn -Follow recommendation by PCCM.   Acute respiratory failure with hypoxia -due to aspiration pneumonia and some degree of third spacing  -remains ventilator dependent -PCCM following -reintubated 3-4 hours after self extubation 03/12/22 -personally reviewed CXR 11.6>>L>R opacities -Overall prognosis is guarded. -After discussing with PCCM if family wants to continue pursuing full scope of practice patient will benefit of transfer to critical care service at [facility].   Acute on chronic renal failure--CKD stage IIIb -Secondary to hypotension and volume depletion -Continue holding furosemide and spironolactone temporarily -Continue IV albumin transfusion -03/20/2022 serum creatinine peaked at 3.99 -Baseline creatinine 2.2-2.4 -11/8 current Cr 3.67   UTI -UA 21-50 WBC -urine culture = >100,000 colonies of GNR -NOT CAUTI -Follow-up speciation and continue current antibiotics.   Decompensated liver cirrhosis -Not a candidate for TIPS -03/28/2022 MELD 30 -11/14/2021 hemoglobin A1c 6.7 -NovoLog sliding scale -he has been declined at [facility] hepatology for above reasons-he will need to follow-up with [facility]. -Overall prognosis is very poor; demonstrating presumably hepatorenal syndrome development.   Episode of Unresponsivness -Sedated, intubated and mechanically ventilated  -unable to properly assess responsiveness.   Coronary Artery Disease -no chest pain  presently -EKG/telemetry without acute ischemic changes.   Diabetes Mellitus type 2 11/14/21 A1C--6.7 -Continue sliding scale insulin and long-acting insulin.   Goals of Care -remains full code -We discussed the patient's overall poor prognosis in the setting of his liver cirrhosis and recurrent life threatening bleed/shock and multisystem organ failure in the setting of his numerous other co-morbidities.   -Patient continued to actively declining despite full scope of treatment/intervention. -After long discussion with family members on 03/14/2022 anticipated plan is for terminal extubation and full comfort care later today. -Will continue to follow palliative care assistance, recommendation and management.   *Once family arrived and further advance care planning discussed with patient terminal extubation and full comfort care was initiated.  Patient peacefully passed away at 16:32.  Family at bedside.  Procedures: See below for x-ray reports.  Consultations: PCCM, gastroenterology, palliative care.  The results of significant diagnostics from this hospitalization (including imaging, microbiology, ancillary and laboratory) are listed below for reference.   Significant Diagnostic Studies: DG Chest Port 1 View  Result Date: 03/13/2022 CLINICAL DATA:  Acute respiratory failure with hypoxemia. EXAM: PORTABLE CHEST 1 VIEW COMPARISON:  March 12, 2022. FINDINGS: The heart size and mediastinal contours are within normal limits. Endotracheal and right internal jugular catheters are unchanged in position. Stable bilateral lung opacities are noted concerning for multifocal pneumonia or edema. The visualized skeletal structures are unremarkable. IMPRESSION: Stable support apparatus. Stable bilateral lung opacities as described above. Electronically Signed   By: Marijo Conception M.D.   On: 03/13/2022 10:17   DG CHEST PORT 1 VIEW  Result Date: 03/12/2022 CLINICAL DATA:  Endotracheal tube placement.  EXAM: PORTABLE CHEST 1 VIEW COMPARISON:  Same day. FINDINGS: Endotracheal tube is in grossly good position.  Right internal jugular catheter is unchanged. Significantly increased bilateral lung opacities are noted concerning for pneumonia or possibly edema. Small bilateral pleural effusions are most likely present. Bony thorax is unremarkable. IMPRESSION: Endotracheal tube in grossly good position. Significantly increased bilateral lung opacities as described above. Electronically Signed   By: Marijo Conception M.D.   On: 03/12/2022 12:21   DG CHEST PORT 1 VIEW  Result Date: 03/12/2022 CLINICAL DATA:  Respiratory distress EXAM: PORTABLE CHEST 1 VIEW COMPARISON:  Previous studies including the examination done earlier today FINDINGS: Transverse diameter of heart is in the upper limits of normal. Diffuse alveolar densities are seen in left parahilar region and left lower lung field. Increased interstitial markings are seen in right parahilar region. No significant interval changes are noted in comparison with the immediate previous study. Right lateral CP angle is clear. There is minimal blunting of left lateral CP angle. There is no pneumothorax. Faint opacity seen in the right apex may be an artifact such as skin fold. Tip of right IJ central venous catheter is seen at the junction of superior vena cava and right atrium. IMPRESSION: Extensive interstitial and alveolar densities are seen in both lungs, more so on the left side with no significant interval change. Findings suggest multifocal pneumonia. Part of this finding may be due to pulmonary edema. Small left pleural effusion. Electronically Signed   By: Elmer Picker M.D.   On: 03/12/2022 08:00   DG CHEST PORT 1 VIEW  Result Date: 03/12/2022 CLINICAL DATA:  Acute respiratory failure with hypoxia EXAM: PORTABLE CHEST 1 VIEW COMPARISON:  03/16/2022 FINDINGS: Endotracheal tube tip 3 cm above the carina. Central line tip at the [facility] RA junction. Worsening  of infiltrate in the perihilar lung bilaterally, worse on the left than the right. This is presumed represent pneumonia, though an element of edema could be present. IMPRESSION: Worsening of perihilar infiltrate, worse on the left than the right. This is presumed to represent pneumonia, though an element of edema could be present. Electronically Signed   By: Nelson Chimes M.D.   On: 03/12/2022 07:19   DG CHEST PORT 1 VIEW  Result Date: 04/02/2022 CLINICAL DATA:  Status post retraction of a right jugular catheter. EXAM: PORTABLE CHEST 1 VIEW COMPARISON:  Earlier today. FINDINGS: The right jugular catheter has been retracted with its tip at the superior cavoatrial junction. The endotracheal tube is unchanged. Bilateral linear densities, mildly increased on the left and mildly improved on the right. This includes a persistent curvy linear density at the right lung base, simulating pneumoperitoneum. Thoracic spine degenerative changes. IMPRESSION: 1. Right jugular catheter tip at the superior cavoatrial junction. 2. Bilateral atelectasis, mildly increased on the left and mildly improved on the right. Electronically Signed   By: Claudie Revering M.D.   On: 03/25/2022 16:23   DG Chest Port 1 View  Result Date: 03/27/2022 CLINICAL DATA:  Respiratory failure.  Intubation. EXAM: PORTABLE CHEST 1 VIEW COMPARISON:  03/26/2022 and prior studies FINDINGS: Endotracheal tube is now noted with tip 3.5 cm above the carina. RIGHT IJ central venous catheter with tip overlying the RIGHT atrium again noted. This is a low volume study with slightly increasing bibasilar opacities/atelectasis. There is no evidence of pneumothorax. There may be trace bilateral pleural effusions present. No other significant changes noted. IMPRESSION: 1. Endotracheal tube with tip 3.5 cm above the carina. 2. RIGHT IJ central venous catheter with tip overlying the RIGHT atrium, consider 3-4 cm retraction. 3. Slightly increasing bibasilar  opacities/atelectasis with possible trace bilateral pleural effusions. Electronically Signed   By: Margarette Canada M.D.   On: 04/05/2022 14:03   DG Chest Port 1 View  Result Date: 03/25/2022 CLINICAL DATA:  Central line placement. Shortness of breath. Syncope. EXAM: PORTABLE CHEST 1 VIEW COMPARISON:  03/08/2022 FINDINGS: A new right jugular central venous catheter seen with tip overlying the mid right atrium, approximately 4 cm below the superior cavoatrial junction. No evidence of pneumothorax. Low lung volumes are again seen with mild bibasilar atelectasis. Evidence of pulmonary consolidation or pleural effusion. Heart size is within normal limits. IMPRESSION: New right jugular central venous catheter tip overlies the mid right atrium, approximately 4 cm below the superior cavoatrial junction. No evidence of pneumothorax. Low lung volumes and bibasilar atelectasis, without significant change. Electronically Signed   By: Marlaine Hind M.D.   On: 03/20/2022 10:25   DG Chest Portable 1 View  Result Date: 03/30/2022 CLINICAL DATA:  Shortness of breath. Syncope. EXAM: PORTABLE CHEST 1 VIEW COMPARISON:  02/21/2022 FINDINGS: Normal sized heart. Poor inspiration. Minimal linear atelectasis/scarring at both lung bases. Otherwise, clear lungs with normal vascularity. Thoracic spine degenerative changes. IMPRESSION: Poor inspiration with minimal bibasilar linear atelectasis/scarring. Electronically Signed   By: Claudie Revering M.D.   On: 03/09/2022 14:01   US Paracentesis  Result Date: 03/06/2022 INDICATION: Cirrhosis, ascites EXAM: ULTRASOUND GUIDED DIAGNOSTIC AND THERAPEUTIC PARACENTESIS MEDICATIONS: None. COMPLICATIONS: None immediate. PROCEDURE: Informed written consent was obtained from the patient after a discussion of the risks, benefits and alternatives to treatment. A timeout was performed prior to the initiation of the procedure. Initial ultrasound scanning demonstrates a large amount of ascites within the  right lower abdominal quadrant. The right lower abdomen was prepped and draped in the usual sterile fashion. 1% lidocaine was used for local anesthesia. Following this, a 19 gauge, 7-cm, Yueh catheter was introduced. An ultrasound image was saved for documentation purposes. The paracentesis was performed. The catheter was removed and a dressing was applied. The patient tolerated the procedure well without immediate post procedural complication. Patient received post-procedure intravenous albumin; see nursing notes for details. FINDINGS: A total of approximately 5 of yellow fluid was removed. Samples were sent to the laboratory as requested by the clinical team. IMPRESSION: Successful ultrasound-guided paracentesis yielding 5 liters of peritoneal fluid. Electronically Signed   By: Lavonia Dana M.D.   On: 03/06/2022 14:00   IR Paracentesis  Result Date: 02/27/2022 INDICATION: Patient with a history of cirrhosis and recurrent ascites. Interventional radiology asked to perform a diagnostic and therapeutic paracentesis. EXAM: ULTRASOUND GUIDED PARACENTESIS MEDICATIONS: 1% lidocaine 10 mL COMPLICATIONS: None immediate. PROCEDURE: Informed written consent was obtained from the patient after a discussion of the risks, benefits and alternatives to treatment. A timeout was performed prior to the initiation of the procedure. Initial ultrasound scanning demonstrates a large amount of ascites within the right lower abdominal quadrant. The right lower abdomen was prepped and draped in the usual sterile fashion. 1% lidocaine was used for local anesthesia. Following this, a 19 gauge, 7-cm, Yueh catheter was introduced. An ultrasound image was saved for documentation purposes. The paracentesis was performed. The catheter was removed and a dressing was applied. The patient tolerated the procedure well without immediate post procedural complication. Patient received post-procedure intravenous albumin; see nursing notes for  details. FINDINGS: A total of approximately 5.4 L of clear yellow fluid was removed. Samples were sent to the laboratory as requested by the clinical team. IMPRESSION: Successful ultrasound-guided paracentesis yielding 5.4 liters  of peritoneal fluid. Read by: Soyla Dryer, NP PLAN: The patient has previously been evaluated by the [facility] Interventional Radiology Portal Hypert[facility], and deemed not a candidate for intervention at this time due to persistently elevated Na-MELD Electronically Signed   By: Ruthann Cancer M.D.   On: 02/27/2022 14:36   DG Swallowing Func-Speech Pathology  Result Date: 02/27/2022 Table formatting from the original result was not included. Objective Swallowing Evaluation: Type of Study: MBS-Modified Barium Swallow Study  Patient Details Name: JAHRON HUNSINGER MRN: [id number] Date of Birth: [birth year] Today's Date: 02/27/2022 Time: SLP Start Time (ACUTE ONLY): 1237 -SLP Stop Time (ACUTE ONLY): 4967 SLP Time Calculation (min) (ACUTE ONLY): 18 min Past Medical History: Past Medical History: Diagnosis Date  Cataract   CKD (chronic kidney disease) stage 3, GFR 30-59 ml/min (HCC)   Coronary atherosclerosis of native coronary artery   BMS LAD 2006; residual 75% distal RCA; EF 50%  Essential hypertension   Glaucoma   Low back pain   MI (myocardial infarction) ([place])   Anterior 2006  Mixed hyperlipidemia   Renal insufficiency   Type 2 diabetes mellitus ([place])  Past Surgical History: Past Surgical History: Procedure Laterality Date  CATARACT EXTRACTION, BILATERAL    CORONARY ANGIOPLASTY WITH STENT PLACEMENT  2006  ESOPHAGEAL BANDING  02/01/2022  Procedure: ESOPHAGEAL BANDING;  Surgeon: Eloise Harman, DO;  [facility]: [facility]O [facility];  Service: Endoscopy;;  ESOPHAGEAL BANDING  02/20/2022  Procedure: ESOPHAGEAL BANDING;  Surgeon: Ladene Artist, MD;  Location: [facility] ENDOSCOPY;  Service: Gastroenterology;;  ESOPHAGOGASTRODUODENOSCOPY (EGD) WITH PROPOFOL N/A 02/01/2022  Procedure:  ESOPHAGOGASTRODUODENOSCOPY (EGD) WITH PROPOFOL;  Surgeon: Eloise Harman, DO;  Location: [facility] ENDO SUITE;  Service: Endoscopy;  Laterality: N/A;  12:30 am  ESOPHAGOGASTRODUODENOSCOPY (EGD) WITH PROPOFOL N/A 02/20/2022  Procedure: ESOPHAGOGASTRODUODENOSCOPY (EGD) WITH PROPOFOL;  Surgeon: Ladene Artist, MD;  Location: [facility] ENDOSCOPY;  Service: Gastroenterology;  Laterality: N/A; HPI: RAMELLO CORDIAL is a 71 y.o. male who presented to [place] 10/16 with cramping of the hands and feet.  He had had a paracentesis earlier that day with 4 L removed (see below).  While in ED, he began to spit up bright red blood.  This progressed to frank hematemesis, large-volume and persistent.  He was started on Protonix, octreotide, ceftriaxone.  He then became hypotensive; therefore, massive transfusion protocol was initiated.  He received 8 units PRBC, 2 units FFP, 1 unit platelets.  He was intubated for airway protection was also started on Levophed. Pt with EGD 10/18 with banding x6. CXR 10/18 without focal infiltrate. 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  He was apparently evaluated at [facility] for transplant but was deemed to not be a candidate.  No data recorded  Recommendations for follow up therapy are one component of a multi-disciplinary discharge planning process, led by the attending physician.  Recommendations may be updated based on patient status, additional functional criteria and insurance authorization. Assessment / Plan / Recommendation   02/27/2022   1:11 PM Clinical Impressions Clinical Impression Pt presents with a mild oropharyngeal dysphagia c/b delayed swallow initiation, reduced base of tongue retraction, incomplete laryngeal closure, and diminished sensation.  These deficits resulted in penetration of thin  liquid during the swallow.  Penetration was silent, but did fully clear with subsequent swallows.  There was moderate vallecula residue with all consistencies which was  also reduced with subequent swallows.  With pill simulation, there was brief stasis of tablet in vallecula.  There was esophagea stasis with table which was not fully cleared by liquid wash, but did clear with puree bolus.  Recommend advancing pt to regular texture solids as medically appropriate.  Alternate liquids and solids.  SLP Visit Diagnosis Dysphagia, oropharyngeal phase (R13.12) Impact on safety and function Mild aspiration risk     02/27/2022   1:11 PM Treatment Recommendations Treatment Recommendations No treatment recommended at this time     02/27/2022   1:15 PM Prognosis Prognosis for Safe Diet Advancement --   02/27/2022   1:11 PM Diet Recommendations SLP Diet Recommendations Thin liquid;Regular solids Liquid Administration via Straw Medication Administration Whole meds with liquid Compensations Slow rate;Small sips/bites;Follow solids with liquid     02/27/2022   1:11 PM Other Recommendations Oral Care Recommendations Oral care BID Follow Up Recommendations No SLP follow up Assistance recommended at discharge -- Functional Status Assessment Patient has not had a recent decline in their functional status   02/23/2022  12:29 PM Frequency and Duration  Speech Therapy Frequency (ACUTE ONLY) min 2x/week Treatment Duration 2 weeks     02/27/2022   1:06 PM Oral Phase Oral - Thin Straw Premature spillage Oral - Puree WFL Oral - Regular [facility]    02/27/2022   1:09 PM Pharyngeal Phase Pharyngeal- Thin Straw Reduced tongue base retraction;Penetration/Aspiration during swallow Pharyngeal- Puree Pharyngeal residue - valleculae;Reduced tongue base retraction;Delayed swallow initiation-vallecula Pharyngeal- Regular Reduced tongue base retraction;Pharyngeal residue - valleculae Pharyngeal- Pill Reduced tongue base retraction;Pharyngeal residue -  valleculae Pharyngeal Material does not enter airway    02/27/2022   1:08 PM Cervical Esophageal Phase  Cervical Esophageal Phase Impaired Celedonio Savage, MA, CCC-SLP Acute Rehabilitation Services Office: [phone] 02/27/2022, 1:20 PM                     DG Chest Port 1 View  Result Date: 02/21/2022 CLINICAL DATA:  Ventilator dependent respiratory failure. EXAM: PORTABLE CHEST 1 VIEW COMPARISON:  Portable chest yesterday at 10:23 p.m. FINDINGS: 4:59 a.m. ETT tip is 3.1 cm from the carina, left IJ central line tip in the distal SVC. There are multiple overlying monitor wires. There are low lung volumes. Mild elevation right hemidiaphragm limiting visualization of the right base. There is perihilar and right basilar linear atelectasis without focal consolidation the visualized lungs. The cardiomediastinal silhouette and vasculature are normal. There is thoracic spondylosis. IMPRESSION: Limited exam with low lung volumes and elevated right hemidiaphragm. Assessment of the bases is limited. The visualized lungs without focal infiltrates. Support apparatus as above. Electronically Signed   By: Telford Nab M.D.   On: 02/21/2022 06:18   DG CHEST PORT 1 VIEW  Result Date: 02/20/2022 CLINICAL DATA:  Central line placement EXAM: PORTABLE CHEST 1 VIEW COMPARISON:  02/20/2022 FINDINGS: Left central line is been placed with the tip in the SVC. No pneumothorax. Endotracheal tube is 5 cm above the carina. Heart normal size. No confluent opacities or effusions. IMPRESSION: Support devices in expected position as above. No acute cardiopulmonary disease. Electronically Signed   By: Rolm Baptise M.D.   On: 02/20/2022 22:30   DG Chest Port 1 View  Result Date: 02/20/2022 CLINICAL DATA:  Intubation EXAM: PORTABLE CHEST 1 VIEW COMPARISON:  02/13/2022 FINDINGS: Endotracheal tube with tip just below the clavicular heads. The enteric tube at least reaches the stomach. Artifact from EKG leads. Low volume chest with  streaky density  at the bases suggesting atelectasis. No effusion or pneumothorax. Skin fold at the left apex. IMPRESSION: 1. Unremarkable hardware positioning. 2. Low volume chest with mild atelectasis. Electronically Signed   By: Jorje Guild M.D.   On: 02/20/2022 04:13   US Paracentesis  Result Date: 02/19/2022 INDICATION: Ascites secondary to cirrhosis EXAM: ULTRASOUND GUIDED  PARACENTESIS MEDICATIONS: None. COMPLICATIONS: None immediate. PROCEDURE: Informed written consent was obtained from the patient after a discussion of the risks, benefits and alternatives to treatment. A timeout was performed prior to the initiation of the procedure. Initial ultrasound scanning demonstrates a large amount of ascites within the right lower abdominal quadrant. The right lower abdomen was prepped and draped in the usual sterile fashion. 1% lidocaine was used for local anesthesia. Following this, a 19 gauge, 7-cm, Yueh catheter was introduced. An ultrasound image was saved for documentation purposes. The paracentesis was performed. The catheter was removed and a dressing was applied. The patient tolerated the procedure well without immediate post procedural complication. Patient received post-procedure intravenous albumin; see nursing notes for details. FINDINGS: A total of approximately 4.0 L of serous fluid was removed. Samples were sent to the laboratory as requested by the clinical team. IMPRESSION: Successful ultrasound-guided paracentesis yielding 4.0 L of peritoneal fluid. Electronically Signed   By: Abigail Miyamoto M.D.   On: 02/19/2022 12:13   US Paracentesis  Result Date: 02/14/2022 INDICATION: Cirrhosis with recurrent ascites EXAM: ULTRASOUND GUIDED RLQ PARACENTESIS MEDICATIONS: None. COMPLICATIONS: None immediate. PROCEDURE: Informed written consent was obtained from the patient after a discussion of the risks, benefits and alternatives to treatment. A timeout was performed prior to the initiation of the  procedure. Initial ultrasound scanning demonstrates a large amount of ascites within the right lower abdominal quadrant. The right lower abdomen was prepped and draped in the usual sterile fashion. 1% lidocaine was used for local anesthesia. Following this, a 19 gauge, 7-cm, Yueh catheter was introduced. An ultrasound image was saved for documentation purposes. The paracentesis was performed. The catheter was removed and a dressing was applied. The patient tolerated the procedure well without immediate post procedural complication. Patient received post-procedure intravenous albumin; see nursing notes for details. FINDINGS: A total of approximately 3.4L of peritoneal fluid was removed. Samples were sent to the laboratory as requested by the clinical team. IMPRESSION: Successful ultrasound-guided paracentesis yielding 3.4 liters of peritoneal fluid. PLAN: The patient has required >/=2 paracenteses in a 30 day period and a formal evaluation by the [facility] has been arranged. Electronically Signed   By: Lavonia Dana M.D.   On: 02/14/2022 10:56    Microbiology: Recent Results (from the past 240 hour(s))  Culture, body fluid w Gram Stain-bottle     Status: None   Collection Time: 03/06/22  1:20 PM   Specimen: Peritoneal Washings  Result Value Ref Range Status   Specimen Description PERITONEAL  Final   Special Requests BAA 10CC  Final   Culture   Final    NO GROWTH 5 DAYS Performed at [facility], [address] [place], [place] [address]    Report Status 03/08/2022 FINAL  Final  Gram stain     Status: None   Collection Time: 03/06/22  1:20 PM   Specimen: Peritoneal Washings  Result Value Ref Range Status   Specimen Description PERITONEAL  Final   Special Requests NONE  Final   Gram Stain   Final    NO ORGANISMS SEEN WBC PRESENT,BOTH PMN AND MONONUCLEAR CYTOSPIN SMEAR Performed  at [facility], [address], [place], [place] [address]     Report Status 03/06/2022 FINAL  Final  MRSA Next Gen by PCR, Nasal     Status: None   Collection Time: 04/05/2022 12:29 PM   Specimen: Nasal Mucosa; Nasal Swab  Result Value Ref Range Status   MRSA by PCR Next Gen NOT DETECTED NOT DETECTED Final    Comment: (NOTE) The GeneXpert MRSA Assay (FDA approved for NASAL specimens only), is one component of a comprehensive MRSA colonization surveillance program. It is not intended to diagnose MRSA infection nor to guide or monitor treatment for MRSA infections. Test performance is not FDA approved in patients less than 21 years old. Performed at [facility], [address], [place], [place] [address]   Urine Culture     Status: Abnormal   Collection Time: 03/21/2022  3:50 PM   Specimen: Urine, Catheterized  Result Value Ref Range Status   Specimen Description   Final    URINE, CATHETERIZED Performed at [facility], [address], [place], [place] [address]    Special Requests   Final    NONE Performed at [facility], [address], [place], [place] [address]    Culture >=100,000 COLONIES/mL BURKHOLDERIA SPECIES (A)  Final   Report Status 03/14/2022 FINAL  Final   Organism ID, Bacteria BURKHOLDERIA SPECIES (A)  Final      Susceptibility   Burkholderia species - MIC*    CEFAZOLIN >=64 RESISTANT Resistant     GENTAMICIN RESISTANT Resistant     CIPROFLOXACIN 1 SENSITIVE Sensitive     IMIPENEM >=16 RESISTANT Resistant     TRIMETH/SULFA <=20 SENSITIVE Sensitive     * >=100,000 COLONIES/mL BURKHOLDERIA SPECIES    Time spent: 30 minutes  Signed: Barton Dubois, MD 2022/04/06

## 2022-04-06 DEATH — deceased

## 2022-05-11 ENCOUNTER — Other Ambulatory Visit: Payer: 59

## 2022-05-18 ENCOUNTER — Ambulatory Visit: Payer: 59 | Admitting: Physician Assistant

## 2022-09-04 ENCOUNTER — Ambulatory Visit: Payer: 59 | Admitting: Cardiology
